# Patient Record
Sex: Female | Born: 1935 | Race: White | Hispanic: No | Smoking: Never smoker
Health system: Southern US, Community
[De-identification: ages and names within clinical notes are randomized; demographics above are authoritative.]

## PROBLEM LIST (undated history)

## (undated) DIAGNOSIS — R29898 Other symptoms and signs involving the musculoskeletal system: Secondary | ICD-10-CM

## (undated) DIAGNOSIS — I959 Hypotension, unspecified: Secondary | ICD-10-CM

## (undated) DIAGNOSIS — R7301 Impaired fasting glucose: Secondary | ICD-10-CM

## (undated) DIAGNOSIS — M5412 Radiculopathy, cervical region: Secondary | ICD-10-CM

## (undated) DIAGNOSIS — B372 Candidiasis of skin and nail: Secondary | ICD-10-CM

## (undated) DIAGNOSIS — I4510 Unspecified right bundle-branch block: Secondary | ICD-10-CM

## (undated) DIAGNOSIS — D692 Other nonthrombocytopenic purpura: Secondary | ICD-10-CM

## (undated) DIAGNOSIS — R5383 Other fatigue: Secondary | ICD-10-CM

## (undated) DIAGNOSIS — C7970 Secondary malignant neoplasm of unspecified adrenal gland: Secondary | ICD-10-CM

## (undated) DIAGNOSIS — C801 Malignant (primary) neoplasm, unspecified: Secondary | ICD-10-CM

## (undated) DIAGNOSIS — K5900 Constipation, unspecified: Secondary | ICD-10-CM

## (undated) DIAGNOSIS — N390 Urinary tract infection, site not specified: Secondary | ICD-10-CM

## (undated) DIAGNOSIS — M1611 Unilateral primary osteoarthritis, right hip: Secondary | ICD-10-CM

## (undated) DIAGNOSIS — S22000A Wedge compression fracture of unspecified thoracic vertebra, initial encounter for closed fracture: Secondary | ICD-10-CM

## (undated) DIAGNOSIS — R350 Frequency of micturition: Secondary | ICD-10-CM

## (undated) DIAGNOSIS — R0602 Shortness of breath: Secondary | ICD-10-CM

## (undated) DIAGNOSIS — R971 Elevated cancer antigen 125 [CA 125]: Secondary | ICD-10-CM

## (undated) DIAGNOSIS — M545 Low back pain: Secondary | ICD-10-CM

## (undated) DIAGNOSIS — R42 Dizziness and giddiness: Secondary | ICD-10-CM

## (undated) DIAGNOSIS — M810 Age-related osteoporosis without current pathological fracture: Secondary | ICD-10-CM

## (undated) DIAGNOSIS — R609 Edema, unspecified: Secondary | ICD-10-CM

## (undated) DIAGNOSIS — H35379 Puckering of macula, unspecified eye: Secondary | ICD-10-CM

## (undated) DIAGNOSIS — E871 Hypo-osmolality and hyponatremia: Secondary | ICD-10-CM

## (undated) DIAGNOSIS — M25559 Pain in unspecified hip: Secondary | ICD-10-CM

## (undated) DIAGNOSIS — C50919 Malignant neoplasm of unspecified site of unspecified female breast: Secondary | ICD-10-CM

## (undated) DIAGNOSIS — K5730 Diverticulosis of large intestine without perforation or abscess without bleeding: Secondary | ICD-10-CM

## (undated) DIAGNOSIS — K589 Irritable bowel syndrome without diarrhea: Secondary | ICD-10-CM

## (undated) DIAGNOSIS — M4120 Other idiopathic scoliosis, site unspecified: Secondary | ICD-10-CM

## (undated) DIAGNOSIS — R918 Other nonspecific abnormal finding of lung field: Secondary | ICD-10-CM

## (undated) DIAGNOSIS — M6530 Trigger finger, unspecified finger: Secondary | ICD-10-CM

## (undated) DIAGNOSIS — M1990 Unspecified osteoarthritis, unspecified site: Secondary | ICD-10-CM

## (undated) DIAGNOSIS — N159 Renal tubulo-interstitial disease, unspecified: Secondary | ICD-10-CM

## (undated) DIAGNOSIS — M48062 Spinal stenosis, lumbar region with neurogenic claudication: Secondary | ICD-10-CM

## (undated) DIAGNOSIS — H3530 Unspecified macular degeneration: Secondary | ICD-10-CM

## (undated) DIAGNOSIS — S2239XA Fracture of one rib, unspecified side, initial encounter for closed fracture: Secondary | ICD-10-CM

## (undated) DIAGNOSIS — H353190 Nonexudative age-related macular degeneration, unspecified eye, stage unspecified: Secondary | ICD-10-CM

## (undated) DIAGNOSIS — F329 Major depressive disorder, single episode, unspecified: Secondary | ICD-10-CM

## (undated) DIAGNOSIS — N760 Acute vaginitis: Secondary | ICD-10-CM

## (undated) DIAGNOSIS — G4700 Insomnia, unspecified: Secondary | ICD-10-CM

## (undated) DIAGNOSIS — H31019 Macula scars of posterior pole (postinflammatory) (post-traumatic), unspecified eye: Secondary | ICD-10-CM

## (undated) DIAGNOSIS — R5381 Other malaise: Secondary | ICD-10-CM

## (undated) DIAGNOSIS — E876 Hypokalemia: Secondary | ICD-10-CM

## (undated) DIAGNOSIS — R948 Abnormal results of function studies of other organs and systems: Secondary | ICD-10-CM

## (undated) DIAGNOSIS — L679 Hair color and hair shaft abnormality, unspecified: Secondary | ICD-10-CM

## (undated) DIAGNOSIS — H9190 Unspecified hearing loss, unspecified ear: Secondary | ICD-10-CM

## (undated) DIAGNOSIS — S2249XA Multiple fractures of ribs, unspecified side, initial encounter for closed fracture: Secondary | ICD-10-CM

## (undated) DIAGNOSIS — I10 Essential (primary) hypertension: Secondary | ICD-10-CM

## (undated) DIAGNOSIS — M62838 Other muscle spasm: Secondary | ICD-10-CM

## (undated) DIAGNOSIS — L259 Unspecified contact dermatitis, unspecified cause: Secondary | ICD-10-CM

## (undated) DIAGNOSIS — S72043A Displaced fracture of base of neck of unspecified femur, initial encounter for closed fracture: Secondary | ICD-10-CM

## (undated) DIAGNOSIS — H8110 Benign paroxysmal vertigo, unspecified ear: Secondary | ICD-10-CM

## (undated) HISTORY — DX: Puckering of macula, unspecified eye: H35.379

## (undated) HISTORY — DX: Radiculopathy, cervical region: M54.12

## (undated) HISTORY — DX: Hair color and hair shaft abnormality, unspecified: L67.9

## (undated) HISTORY — DX: Other fatigue: R53.83

## (undated) HISTORY — DX: Other malaise: R53.81

## (undated) HISTORY — DX: Irritable bowel syndrome without diarrhea: K58.9

## (undated) HISTORY — DX: Other muscle spasm: M62.838

## (undated) HISTORY — DX: Hypokalemia: E87.6

## (undated) HISTORY — DX: Major depressive disorder, single episode, unspecified: F32.9

## (undated) HISTORY — DX: Secondary malignant neoplasm of unspecified adrenal gland: C79.70

## (undated) HISTORY — DX: Low back pain: M54.5

## (undated) HISTORY — DX: Hypotension, unspecified: I95.9

## (undated) HISTORY — DX: Elevated cancer antigen 125 (CA 125): R97.1

## (undated) HISTORY — DX: Diverticulosis of large intestine without perforation or abscess without bleeding: K57.30

## (undated) HISTORY — DX: Unspecified right bundle-branch block: I45.10

## (undated) HISTORY — DX: Wedge compression fracture of unspecified thoracic vertebra, initial encounter for closed fracture: S22.000A

## (undated) HISTORY — DX: Unilateral primary osteoarthritis, right hip: M16.11

## (undated) HISTORY — PX: BREAST SURGERY: SHX581

## (undated) HISTORY — DX: Edema, unspecified: R60.9

## (undated) HISTORY — DX: Other nonspecific abnormal finding of lung field: R91.8

## (undated) HISTORY — DX: Candidiasis of skin and nail: B37.2

## (undated) HISTORY — DX: Spinal stenosis, lumbar region with neurogenic claudication: M48.062

## (undated) HISTORY — DX: Malignant neoplasm of unspecified site of unspecified female breast: C50.919

## (undated) HISTORY — DX: Displaced fracture of base of neck of unspecified femur, initial encounter for closed fracture: S72.043A

## (undated) HISTORY — DX: Age-related osteoporosis without current pathological fracture: M81.0

## (undated) HISTORY — DX: Trigger finger, unspecified finger: M65.30

## (undated) HISTORY — DX: Macula scars of posterior pole (postinflammatory) (post-traumatic), unspecified eye: H31.019

## (undated) HISTORY — DX: Nonexudative age-related macular degeneration, unspecified eye, stage unspecified: H35.3190

## (undated) HISTORY — DX: Dizziness and giddiness: R42

## (undated) HISTORY — DX: Hypo-osmolality and hyponatremia: E87.1

## (undated) HISTORY — DX: Constipation, unspecified: K59.00

## (undated) HISTORY — DX: Urinary tract infection, site not specified: N39.0

## (undated) HISTORY — DX: Unspecified contact dermatitis, unspecified cause: L25.9

## (undated) HISTORY — DX: Other idiopathic scoliosis, site unspecified: M41.20

## (undated) HISTORY — DX: Essential (primary) hypertension: I10

## (undated) HISTORY — DX: Impaired fasting glucose: R73.01

## (undated) HISTORY — DX: Insomnia, unspecified: G47.00

## (undated) HISTORY — DX: Benign paroxysmal vertigo, unspecified ear: H81.10

## (undated) HISTORY — PX: TONSILLECTOMY: SUR1361

## (undated) HISTORY — DX: Other symptoms and signs involving the musculoskeletal system: R29.898

## (undated) HISTORY — PX: EYE SURGERY: SHX253

## (undated) HISTORY — DX: Abnormal results of function studies of other organs and systems: R94.8

## (undated) HISTORY — DX: Other nonthrombocytopenic purpura: D69.2

## (undated) HISTORY — DX: Acute vaginitis: N76.0

## (undated) HISTORY — DX: Pain in unspecified hip: M25.559

## (undated) HISTORY — DX: Frequency of micturition: R35.0

---

## 1980-09-11 HISTORY — PX: FRACTURE SURGERY: SHX138

## 1998-03-17 ENCOUNTER — Ambulatory Visit (HOSPITAL_COMMUNITY): Admission: RE | Admit: 1998-03-17 | Discharge: 1998-03-17 | Payer: Self-pay | Admitting: Obstetrics & Gynecology

## 2001-05-04 ENCOUNTER — Encounter: Admission: RE | Admit: 2001-05-04 | Discharge: 2001-05-04 | Payer: Self-pay | Admitting: Neurology

## 2001-05-04 ENCOUNTER — Encounter: Payer: Self-pay | Admitting: Neurology

## 2001-05-13 ENCOUNTER — Ambulatory Visit (HOSPITAL_COMMUNITY): Admission: RE | Admit: 2001-05-13 | Discharge: 2001-05-13 | Payer: Self-pay | Admitting: Neurology

## 2001-05-13 ENCOUNTER — Encounter: Payer: Self-pay | Admitting: Neurology

## 2002-08-22 ENCOUNTER — Other Ambulatory Visit: Admission: RE | Admit: 2002-08-22 | Discharge: 2002-08-22 | Payer: Self-pay | Admitting: Obstetrics and Gynecology

## 2002-10-28 DIAGNOSIS — C50919 Malignant neoplasm of unspecified site of unspecified female breast: Secondary | ICD-10-CM

## 2002-10-28 HISTORY — DX: Malignant neoplasm of unspecified site of unspecified female breast: C50.919

## 2003-01-07 ENCOUNTER — Encounter: Admission: RE | Admit: 2003-01-07 | Discharge: 2003-01-07 | Payer: Self-pay | Admitting: Neurology

## 2003-01-21 ENCOUNTER — Encounter: Admission: RE | Admit: 2003-01-21 | Discharge: 2003-01-21 | Payer: Self-pay | Admitting: Neurology

## 2003-02-04 ENCOUNTER — Encounter: Admission: RE | Admit: 2003-02-04 | Discharge: 2003-02-04 | Payer: Self-pay | Admitting: Neurology

## 2004-09-07 ENCOUNTER — Encounter: Admission: RE | Admit: 2004-09-07 | Discharge: 2004-10-01 | Payer: Self-pay | Admitting: Neurology

## 2004-10-22 ENCOUNTER — Encounter: Admission: RE | Admit: 2004-10-22 | Discharge: 2004-10-22 | Payer: Self-pay | Admitting: Radiology

## 2004-10-23 ENCOUNTER — Encounter: Admission: RE | Admit: 2004-10-23 | Discharge: 2004-10-23 | Payer: Self-pay | Admitting: Surgery

## 2004-10-27 ENCOUNTER — Encounter: Admission: RE | Admit: 2004-10-27 | Discharge: 2004-10-27 | Payer: Self-pay | Admitting: Surgery

## 2004-10-27 ENCOUNTER — Ambulatory Visit (HOSPITAL_COMMUNITY): Admission: RE | Admit: 2004-10-27 | Discharge: 2004-10-27 | Payer: Self-pay | Admitting: Surgery

## 2004-10-27 ENCOUNTER — Encounter (INDEPENDENT_AMBULATORY_CARE_PROVIDER_SITE_OTHER): Payer: Self-pay | Admitting: *Deleted

## 2004-10-27 ENCOUNTER — Ambulatory Visit (HOSPITAL_BASED_OUTPATIENT_CLINIC_OR_DEPARTMENT_OTHER): Admission: RE | Admit: 2004-10-27 | Discharge: 2004-10-28 | Payer: Self-pay | Admitting: Surgery

## 2004-10-27 DIAGNOSIS — C50919 Malignant neoplasm of unspecified site of unspecified female breast: Secondary | ICD-10-CM | POA: Insufficient documentation

## 2004-10-27 HISTORY — DX: Malignant neoplasm of unspecified site of unspecified female breast: C50.919

## 2004-11-04 ENCOUNTER — Ambulatory Visit: Payer: Self-pay | Admitting: Oncology

## 2004-11-19 ENCOUNTER — Encounter: Admission: RE | Admit: 2004-11-19 | Discharge: 2004-11-19 | Payer: Self-pay | Admitting: Oncology

## 2004-11-19 ENCOUNTER — Ambulatory Visit: Admission: RE | Admit: 2004-11-19 | Discharge: 2005-02-05 | Payer: Self-pay | Admitting: *Deleted

## 2004-11-24 ENCOUNTER — Encounter: Admission: RE | Admit: 2004-11-24 | Discharge: 2004-11-24 | Payer: Self-pay | Admitting: Oncology

## 2004-11-30 ENCOUNTER — Other Ambulatory Visit: Admission: RE | Admit: 2004-11-30 | Discharge: 2004-11-30 | Payer: Self-pay | Admitting: Obstetrics and Gynecology

## 2005-01-27 ENCOUNTER — Ambulatory Visit: Payer: Self-pay | Admitting: Oncology

## 2005-03-23 ENCOUNTER — Ambulatory Visit: Payer: Self-pay | Admitting: Oncology

## 2005-07-22 ENCOUNTER — Ambulatory Visit: Payer: Self-pay | Admitting: Oncology

## 2005-07-28 LAB — CBC WITH DIFFERENTIAL/PLATELET
HCT: 32.9 % — ABNORMAL LOW (ref 34.8–46.6)
HGB: 11.1 g/dL — ABNORMAL LOW (ref 11.6–15.9)
LYMPH%: 17 % (ref 14.0–48.0)
MCH: 27.8 pg (ref 26.0–34.0)
MCHC: 33.7 g/dL (ref 32.0–36.0)
MCV: 82.8 fL (ref 81.0–101.0)
RDW: 14.3 % (ref 11.3–14.5)

## 2005-07-28 LAB — COMPREHENSIVE METABOLIC PANEL
Albumin: 3.5 g/dL (ref 3.5–5.2)
CO2: 25 mEq/L (ref 19–32)
Calcium: 8.6 mg/dL (ref 8.4–10.5)
Chloride: 99 mEq/L (ref 96–112)
Glucose, Bld: 149 mg/dL — ABNORMAL HIGH (ref 70–99)
Potassium: 4.2 mEq/L (ref 3.5–5.3)
Sodium: 135 mEq/L (ref 135–145)
Total Bilirubin: 0.3 mg/dL (ref 0.3–1.2)
Total Protein: 6.3 g/dL (ref 6.0–8.3)

## 2005-07-28 LAB — CANCER ANTIGEN 27.29: CA 27.29: 19 U/mL (ref 0–39)

## 2005-08-02 ENCOUNTER — Ambulatory Visit (HOSPITAL_COMMUNITY): Admission: RE | Admit: 2005-08-02 | Discharge: 2005-08-02 | Payer: Self-pay | Admitting: Oncology

## 2005-08-30 LAB — CBC & DIFF AND RETIC
Basophils Absolute: 0 10*3/uL (ref 0.0–0.1)
Eosinophils Absolute: 0.2 10*3/uL (ref 0.0–0.5)
HCT: 33.6 % — ABNORMAL LOW (ref 34.8–46.6)
HGB: 11.3 g/dL — ABNORMAL LOW (ref 11.6–15.9)
LYMPH%: 23.4 % (ref 14.0–48.0)
MCV: 82.4 fL (ref 81.0–101.0)
MONO#: 0.6 10*3/uL (ref 0.1–0.9)
MONO%: 6.4 % (ref 0.0–13.0)
NEUT#: 6.5 10*3/uL (ref 1.5–6.5)
NEUT%: 67.9 % (ref 39.6–76.8)
Platelets: 286 10*3/uL (ref 145–400)
RBC: 4.08 10*6/uL (ref 3.70–5.32)
Retic %: 1.5 % (ref 0.4–2.3)
WBC: 9.5 10*3/uL (ref 3.9–10.0)

## 2005-08-30 LAB — COMPREHENSIVE METABOLIC PANEL
ALT: 13 U/L (ref 0–40)
Albumin: 3.8 g/dL (ref 3.5–5.2)
Alkaline Phosphatase: 49 U/L (ref 39–117)
CO2: 27 mEq/L (ref 19–32)
Glucose, Bld: 123 mg/dL — ABNORMAL HIGH (ref 70–99)
Potassium: 4.3 mEq/L (ref 3.5–5.3)
Sodium: 135 mEq/L (ref 135–145)
Total Bilirubin: 0.3 mg/dL (ref 0.3–1.2)
Total Protein: 6.4 g/dL (ref 6.0–8.3)

## 2005-08-30 LAB — FERRITIN: Ferritin: 74 ng/mL (ref 10–291)

## 2005-08-30 LAB — CANCER ANTIGEN 27.29: CA 27.29: 18 U/mL (ref 0–39)

## 2005-09-06 ENCOUNTER — Ambulatory Visit: Payer: Self-pay | Admitting: Oncology

## 2005-10-29 ENCOUNTER — Ambulatory Visit: Payer: Self-pay | Admitting: Oncology

## 2005-11-02 ENCOUNTER — Ambulatory Visit (HOSPITAL_COMMUNITY): Admission: RE | Admit: 2005-11-02 | Discharge: 2005-11-02 | Payer: Self-pay | Admitting: Oncology

## 2006-01-27 ENCOUNTER — Ambulatory Visit: Payer: Self-pay | Admitting: Oncology

## 2006-02-01 ENCOUNTER — Ambulatory Visit (HOSPITAL_COMMUNITY): Admission: RE | Admit: 2006-02-01 | Discharge: 2006-02-01 | Payer: Self-pay | Admitting: Oncology

## 2006-02-01 LAB — COMPREHENSIVE METABOLIC PANEL
ALT: 9 U/L (ref 0–35)
AST: 13 U/L (ref 0–37)
CO2: 28 mEq/L (ref 19–32)
Calcium: 9.1 mg/dL (ref 8.4–10.5)
Chloride: 101 mEq/L (ref 96–112)
Sodium: 138 mEq/L (ref 135–145)
Total Protein: 6.7 g/dL (ref 6.0–8.3)

## 2006-02-01 LAB — CBC WITH DIFFERENTIAL/PLATELET
BASO%: 0.4 % (ref 0.0–2.0)
EOS%: 3.2 % (ref 0.0–7.0)
MCH: 30.3 pg (ref 26.0–34.0)
MCHC: 34.3 g/dL (ref 32.0–36.0)
MONO#: 0.5 10*3/uL (ref 0.1–0.9)
RBC: 4.22 10*6/uL (ref 3.70–5.32)
RDW: 14.7 % — ABNORMAL HIGH (ref 11.3–14.5)
WBC: 6.3 10*3/uL (ref 3.9–10.0)
lymph#: 1.8 10*3/uL (ref 0.9–3.3)

## 2006-02-01 LAB — CANCER ANTIGEN 27.29: CA 27.29: 15 U/mL (ref 0–39)

## 2006-02-09 ENCOUNTER — Ambulatory Visit (HOSPITAL_COMMUNITY): Admission: RE | Admit: 2006-02-09 | Discharge: 2006-02-09 | Payer: Self-pay | Admitting: Oncology

## 2006-04-28 ENCOUNTER — Ambulatory Visit: Payer: Self-pay | Admitting: Oncology

## 2006-05-03 ENCOUNTER — Ambulatory Visit (HOSPITAL_COMMUNITY): Admission: RE | Admit: 2006-05-03 | Discharge: 2006-05-03 | Payer: Self-pay | Admitting: Oncology

## 2006-05-03 LAB — CBC WITH DIFFERENTIAL/PLATELET
BASO%: 0.4 % (ref 0.0–2.0)
EOS%: 4 % (ref 0.0–7.0)
HCT: 34.2 % — ABNORMAL LOW (ref 34.8–46.6)
LYMPH%: 28.4 % (ref 14.0–48.0)
MCH: 30.4 pg (ref 26.0–34.0)
MCHC: 34.6 g/dL (ref 32.0–36.0)
NEUT%: 56 % (ref 39.6–76.8)
Platelets: 186 10*3/uL (ref 145–400)
RBC: 3.9 10*6/uL (ref 3.70–5.32)
lymph#: 1.5 10*3/uL (ref 0.9–3.3)

## 2006-05-03 LAB — COMPREHENSIVE METABOLIC PANEL
ALT: 9 U/L (ref 0–35)
AST: 15 U/L (ref 0–37)
Alkaline Phosphatase: 38 U/L — ABNORMAL LOW (ref 39–117)
Creatinine, Ser: 0.73 mg/dL (ref 0.40–1.20)
Total Bilirubin: 0.3 mg/dL (ref 0.3–1.2)

## 2006-11-01 ENCOUNTER — Ambulatory Visit: Payer: Self-pay | Admitting: Oncology

## 2006-11-03 LAB — COMPREHENSIVE METABOLIC PANEL
AST: 16 U/L (ref 0–37)
BUN: 13 mg/dL (ref 6–23)
Calcium: 9.2 mg/dL (ref 8.4–10.5)
Chloride: 101 mEq/L (ref 96–112)
Creatinine, Ser: 0.87 mg/dL (ref 0.40–1.20)

## 2006-11-03 LAB — CBC WITH DIFFERENTIAL/PLATELET
Basophils Absolute: 0 10*3/uL (ref 0.0–0.1)
EOS%: 2.4 % (ref 0.0–7.0)
HCT: 35.7 % (ref 34.8–46.6)
HGB: 12.3 g/dL (ref 11.6–15.9)
MCH: 30.5 pg (ref 26.0–34.0)
MCV: 89 fL (ref 81.0–101.0)
MONO%: 7.8 % (ref 0.0–13.0)
NEUT%: 55.7 % (ref 39.6–76.8)
Platelets: 230 10*3/uL (ref 145–400)
lymph#: 2.4 10*3/uL (ref 0.9–3.3)

## 2006-11-12 LAB — CLOSTRIDIUM DIFFICILE EIA

## 2006-11-15 LAB — VITAMIN D PNL(25-HYDRXY+1,25-DIHY)-BLD: Vit D, 25-Hydroxy: 35 ng/mL (ref 30–89)

## 2007-05-04 ENCOUNTER — Ambulatory Visit: Payer: Self-pay | Admitting: Oncology

## 2007-05-09 LAB — CBC WITH DIFFERENTIAL/PLATELET
BASO%: 0.4 % (ref 0.0–2.0)
Eosinophils Absolute: 0.2 10*3/uL (ref 0.0–0.5)
MONO#: 0.6 10*3/uL (ref 0.1–0.9)
NEUT#: 3.5 10*3/uL (ref 1.5–6.5)
RBC: 4.06 10*6/uL (ref 3.70–5.32)
RDW: 14.2 % (ref 11.3–14.5)
WBC: 6.3 10*3/uL (ref 3.9–10.0)

## 2007-05-09 LAB — COMPREHENSIVE METABOLIC PANEL
AST: 22 U/L (ref 0–37)
Albumin: 3.6 g/dL (ref 3.5–5.2)
Alkaline Phosphatase: 30 U/L — ABNORMAL LOW (ref 39–117)
BUN: 8 mg/dL (ref 6–23)
Creatinine, Ser: 0.76 mg/dL (ref 0.40–1.20)
Glucose, Bld: 122 mg/dL — ABNORMAL HIGH (ref 70–99)
Potassium: 3.8 mEq/L (ref 3.5–5.3)
Total Bilirubin: 0.4 mg/dL (ref 0.3–1.2)

## 2007-11-03 ENCOUNTER — Ambulatory Visit: Payer: Self-pay | Admitting: Oncology

## 2007-11-07 LAB — CBC WITH DIFFERENTIAL/PLATELET
Basophils Absolute: 0 10*3/uL (ref 0.0–0.1)
EOS%: 1.9 % (ref 0.0–7.0)
HCT: 36.5 % (ref 34.8–46.6)
HGB: 12.4 g/dL (ref 11.6–15.9)
MCH: 30.7 pg (ref 26.0–34.0)
MONO#: 0.5 10*3/uL (ref 0.1–0.9)
NEUT%: 59.8 % (ref 39.6–76.8)
lymph#: 1.9 10*3/uL (ref 0.9–3.3)

## 2007-11-08 LAB — COMPREHENSIVE METABOLIC PANEL
BUN: 14 mg/dL (ref 6–23)
CO2: 24 mEq/L (ref 19–32)
Calcium: 9.2 mg/dL (ref 8.4–10.5)
Chloride: 103 mEq/L (ref 96–112)
Creatinine, Ser: 0.77 mg/dL (ref 0.40–1.20)
Glucose, Bld: 126 mg/dL — ABNORMAL HIGH (ref 70–99)

## 2007-11-08 LAB — CANCER ANTIGEN 27.29: CA 27.29: 19 U/mL (ref 0–39)

## 2007-11-08 LAB — VITAMIN D 25 HYDROXY (VIT D DEFICIENCY, FRACTURES): Vit D, 25-Hydroxy: 40 ng/mL (ref 30–89)

## 2008-01-12 HISTORY — PX: CATARACT EXTRACTION: SUR2

## 2008-05-03 ENCOUNTER — Ambulatory Visit: Payer: Self-pay | Admitting: Oncology

## 2008-05-10 LAB — CBC WITH DIFFERENTIAL/PLATELET
BASO%: 0.3 % (ref 0.0–2.0)
Basophils Absolute: 0 10*3/uL (ref 0.0–0.1)
EOS%: 3 % (ref 0.0–7.0)
Eosinophils Absolute: 0.2 10*3/uL (ref 0.0–0.5)
HCT: 36.3 % (ref 34.8–46.6)
HGB: 12.4 g/dL (ref 11.6–15.9)
LYMPH%: 35.8 % (ref 14.0–49.7)
MCH: 30.6 pg (ref 25.1–34.0)
MCHC: 34.1 g/dL (ref 31.5–36.0)
MCV: 89.7 fL (ref 79.5–101.0)
MONO#: 0.5 10*3/uL (ref 0.1–0.9)
MONO%: 8 % (ref 0.0–14.0)
NEUT#: 3.6 10*3/uL (ref 1.5–6.5)
NEUT%: 52.9 % (ref 38.4–76.8)
Platelets: 186 10*3/uL (ref 145–400)
RBC: 4.04 10*6/uL (ref 3.70–5.45)
RDW: 14.2 % (ref 11.2–14.5)
WBC: 6.8 10*3/uL (ref 3.9–10.3)
lymph#: 2.4 10*3/uL (ref 0.9–3.3)

## 2008-05-10 LAB — COMPREHENSIVE METABOLIC PANEL
ALT: 12 U/L (ref 0–35)
AST: 17 U/L (ref 0–37)
Albumin: 4.2 g/dL (ref 3.5–5.2)
Alkaline Phosphatase: 36 U/L — ABNORMAL LOW (ref 39–117)
BUN: 12 mg/dL (ref 6–23)
CO2: 26 mEq/L (ref 19–32)
Calcium: 9.5 mg/dL (ref 8.4–10.5)
Chloride: 101 mEq/L (ref 96–112)
Creatinine, Ser: 0.79 mg/dL (ref 0.40–1.20)
Glucose, Bld: 105 mg/dL — ABNORMAL HIGH (ref 70–99)
Potassium: 4.3 mEq/L (ref 3.5–5.3)
Sodium: 137 mEq/L (ref 135–145)
Total Bilirubin: 0.3 mg/dL (ref 0.3–1.2)
Total Protein: 6.6 g/dL (ref 6.0–8.3)

## 2008-05-10 LAB — RESEARCH LABS

## 2008-11-01 ENCOUNTER — Ambulatory Visit: Payer: Self-pay | Admitting: Oncology

## 2008-11-06 LAB — CBC WITH DIFFERENTIAL/PLATELET
BASO%: 0.4 % (ref 0.0–2.0)
Basophils Absolute: 0 10*3/uL (ref 0.0–0.1)
EOS%: 2.1 % (ref 0.0–7.0)
Eosinophils Absolute: 0.2 10*3/uL (ref 0.0–0.5)
HCT: 37.9 % (ref 34.8–46.6)
HGB: 12.6 g/dL (ref 11.6–15.9)
LYMPH%: 31.8 % (ref 14.0–49.7)
MCH: 30.3 pg (ref 25.1–34.0)
MCHC: 33.2 g/dL (ref 31.5–36.0)
MCV: 91.3 fL (ref 79.5–101.0)
MONO#: 0.6 10*3/uL (ref 0.1–0.9)
MONO%: 7.7 % (ref 0.0–14.0)
NEUT#: 4.2 10*3/uL (ref 1.5–6.5)
NEUT%: 58 % (ref 38.4–76.8)
Platelets: 182 10*3/uL (ref 145–400)
RBC: 4.15 10*6/uL (ref 3.70–5.45)
RDW: 14.1 % (ref 11.2–14.5)
WBC: 7.2 10*3/uL (ref 3.9–10.3)
lymph#: 2.3 10*3/uL (ref 0.9–3.3)

## 2008-11-06 LAB — COMPREHENSIVE METABOLIC PANEL
ALT: 11 U/L (ref 0–35)
AST: 15 U/L (ref 0–37)
Albumin: 4.2 g/dL (ref 3.5–5.2)
Alkaline Phosphatase: 38 U/L — ABNORMAL LOW (ref 39–117)
BUN: 12 mg/dL (ref 6–23)
CO2: 22 mEq/L (ref 19–32)
Calcium: 9.1 mg/dL (ref 8.4–10.5)
Chloride: 100 mEq/L (ref 96–112)
Creatinine, Ser: 0.85 mg/dL (ref 0.40–1.20)
Glucose, Bld: 128 mg/dL — ABNORMAL HIGH (ref 70–99)
Potassium: 4.1 mEq/L (ref 3.5–5.3)
Sodium: 136 mEq/L (ref 135–145)
Total Bilirubin: 0.3 mg/dL (ref 0.3–1.2)
Total Protein: 6.8 g/dL (ref 6.0–8.3)

## 2009-05-06 ENCOUNTER — Ambulatory Visit: Payer: Self-pay | Admitting: Oncology

## 2009-05-07 LAB — CBC WITH DIFFERENTIAL/PLATELET
BASO%: 0.3 % (ref 0.0–2.0)
Basophils Absolute: 0 10*3/uL (ref 0.0–0.1)
EOS%: 2.1 % (ref 0.0–7.0)
Eosinophils Absolute: 0.1 10*3/uL (ref 0.0–0.5)
HCT: 38.7 % (ref 34.8–46.6)
HGB: 13.2 g/dL (ref 11.6–15.9)
LYMPH%: 34.2 % (ref 14.0–49.7)
MCH: 31 pg (ref 25.1–34.0)
MCHC: 34 g/dL (ref 31.5–36.0)
MCV: 91.3 fL (ref 79.5–101.0)
MONO#: 0.6 10*3/uL (ref 0.1–0.9)
MONO%: 8.7 % (ref 0.0–14.0)
NEUT#: 3.8 10*3/uL (ref 1.5–6.5)
NEUT%: 54.7 % (ref 38.4–76.8)
Platelets: 191 10*3/uL (ref 145–400)
RBC: 4.24 10*6/uL (ref 3.70–5.45)
RDW: 13.9 % (ref 11.2–14.5)
WBC: 7 10*3/uL (ref 3.9–10.3)
lymph#: 2.4 10*3/uL (ref 0.9–3.3)

## 2009-05-07 LAB — COMPREHENSIVE METABOLIC PANEL
ALT: 15 U/L (ref 0–35)
AST: 24 U/L (ref 0–37)
Albumin: 4 g/dL (ref 3.5–5.2)
Alkaline Phosphatase: 41 U/L (ref 39–117)
BUN: 10 mg/dL (ref 6–23)
CO2: 27 mEq/L (ref 19–32)
Calcium: 9 mg/dL (ref 8.4–10.5)
Chloride: 100 mEq/L (ref 96–112)
Creatinine, Ser: 0.86 mg/dL (ref 0.40–1.20)
Glucose, Bld: 151 mg/dL — ABNORMAL HIGH (ref 70–99)
Potassium: 3.5 mEq/L (ref 3.5–5.3)
Sodium: 134 mEq/L — ABNORMAL LOW (ref 135–145)
Total Bilirubin: 0.4 mg/dL (ref 0.3–1.2)
Total Protein: 6.7 g/dL (ref 6.0–8.3)

## 2009-05-07 LAB — VITAMIN D 25 HYDROXY (VIT D DEFICIENCY, FRACTURES): Vit D, 25-Hydroxy: 46 ng/mL (ref 30–89)

## 2009-05-29 ENCOUNTER — Encounter: Admission: RE | Admit: 2009-05-29 | Discharge: 2009-05-29 | Payer: Self-pay | Admitting: Endocrinology

## 2010-01-07 ENCOUNTER — Ambulatory Visit: Payer: Self-pay | Admitting: Oncology

## 2010-01-13 LAB — CBC WITH DIFFERENTIAL/PLATELET
BASO%: 0.5 % (ref 0.0–2.0)
Basophils Absolute: 0 10*3/uL (ref 0.0–0.1)
EOS%: 3.4 % (ref 0.0–7.0)
Eosinophils Absolute: 0.3 10*3/uL (ref 0.0–0.5)
HCT: 36.5 % (ref 34.8–46.6)
HGB: 12.6 g/dL (ref 11.6–15.9)
LYMPH%: 33.4 % (ref 14.0–49.7)
MCH: 31 pg (ref 25.1–34.0)
MCHC: 34.6 g/dL (ref 31.5–36.0)
MCV: 89.6 fL (ref 79.5–101.0)
MONO#: 0.6 10*3/uL (ref 0.1–0.9)
MONO%: 8 % (ref 0.0–14.0)
NEUT#: 4.1 10*3/uL (ref 1.5–6.5)
NEUT%: 54.7 % (ref 38.4–76.8)
Platelets: 181 10*3/uL (ref 145–400)
RBC: 4.07 10*6/uL (ref 3.70–5.45)
RDW: 13.9 % (ref 11.2–14.5)
WBC: 7.6 10*3/uL (ref 3.9–10.3)
lymph#: 2.5 10*3/uL (ref 0.9–3.3)

## 2010-01-13 LAB — COMPREHENSIVE METABOLIC PANEL
ALT: 12 U/L (ref 0–35)
AST: 22 U/L (ref 0–37)
Albumin: 3.8 g/dL (ref 3.5–5.2)
Alkaline Phosphatase: 40 U/L (ref 39–117)
BUN: 8 mg/dL (ref 6–23)
CO2: 28 mEq/L (ref 19–32)
Calcium: 9.2 mg/dL (ref 8.4–10.5)
Chloride: 100 mEq/L (ref 96–112)
Creatinine, Ser: 0.81 mg/dL (ref 0.40–1.20)
Glucose, Bld: 153 mg/dL — ABNORMAL HIGH (ref 70–99)
Potassium: 3.9 mEq/L (ref 3.5–5.3)
Sodium: 136 mEq/L (ref 135–145)
Total Bilirubin: 0.3 mg/dL (ref 0.3–1.2)
Total Protein: 6.6 g/dL (ref 6.0–8.3)

## 2010-05-29 NOTE — Op Note (Signed)
Cassandra Glenn, Cassandra Glenn               ACCOUNT NO.:  [id number]   MEDICAL RECORD NO.:  [id number]          PATIENT TYPE:  AMB   LOCATION:  DSC                          FACILITY:  [facility]   PHYSICIAN:  Currie Paris, M.D.DATE OF BIRTH:  Aug 29, 1920   DATE OF PROCEDURE:  10/27/2004  DATE OF DISCHARGE:                                 OPERATIVE REPORT   CCS [phone].   PREOP DIAGNOSIS:  Invasive right breast cancer, noninvasive left breast  cancer.   POSTOPERATIVE DIAGNOSIS:  Invasive right breast cancer, noninvasive left  breast cancer with right axillary metastases.   OPERATION:  Needle guided right lumpectomy with blue dye injection, axillary  sentinel lymph node biopsy and full axillary dissection; needle guided left  lumpectomy.   SURGEON:  Dr. Jamey Ripa.   ANESTHESIA:  General.   CLINICAL HISTORY:  This is an 83-year lady who has presented with an  invasive breast cancer on the right and what appeared to be a noninvasive on  the left. She had gotten significant bruising at the time of her biopsy so  it was not clear how much of the palpable abnormality was tumor and how much  was a hematoma.  She therefore and bilateral guide guidewires placed to be  sure that the palpable abnormalities coincide with the image found  abnormalities.   DESCRIPTION OF PROCEDURE:  The patient was seen in the holding area and she  had no further questions. We confirmed that we were going to do a needle  guided right lumpectomy with axillary dissection, actually sentinel lymph  node biopsy on the right and a left needle guided lumpectomy. Guidewire has  already been placed.   The patient taken to operating room and after satisfactory general  anesthesia had been obtained, the right breast areolar area was prepped with  alcohol and the time-out occurred. I then injected 5 mL of dilute methylene  blue dye. This was massaged in. Both breasts were prepped and draped as a  single sterile field.   The  mass in the right breast was in the upper outer quadrant with some  apparent fixation to the skin and almost into the axilla. Guidewire entered  laterally and tracked medially. I made an elliptical incision around the  entire palpable abnormality taking about a centimeter wide area of skin.  Using the cautery, I did full-thickness excision down to fascia well around  the guidewire manipulating guidewire laterally into this specimen. This was  then sent for specimen mammography.   While waiting for this I entered the axilla and using a Neoprobe found a one  firm, hard node that had some counts about 60-100. This was excised as one  sentinel node. A second hot node again with counts about 40 was found and  excised and this likewise was sent for Touch Preps. Both had no blue dye. In  fact I could find no blue dye within the axilla.   While waiting for reports I went ahead and injected some Marcaine and made  sure everything was dry and closed this incision with 3-0 Vicryl and 4-0  Monocryl  subcuticular.   Attention was turned to the left breast and again guidewire then placed.  There is a palpable abnormality at about the 12 o'clock position. Using new  instruments, gloves, etc. I made an elliptical incision over the mass and I  excised it again down to chest wall and was felt I was well around the area  of abnormality. Specimen myography subsequently confirmed that to be the  case.   This incision was likewise checked for hemostasis and anesthetized with some  0.25% plain Marcaine and closed with 3-0 Vicryl and 4-0 Monocryl  subcuticular.   Pathology reported that one of the sentinel nodes was definitely positive  and the second questionable. I therefore turned my attention back to the  right side and reopened that incision and completed an axillary dissection  as planned, should the sentinel node be positive. I opened the clavipectoral  fascia until I could identify the axillary vein,   stripped the axillary  contents from medial to lateral superior to inferior. The long thoracic  thoracodorsal nerves were identified and preserved as was the blood supply  to the pectoralis. A second intercostal was taken at the edge of the chest  wall. Once everything had been disconnected off the latissimus, this  specimens was sent off. I irrigated, spent several minutes making sure  everything was dry. Once everything looked okay, I went ahead and put a 19  Blake drain in through a separate stab wound and secured it with 2-0 nylon.  I injected a little bit more Marcaine to see if we could help with postop  analgesia and closed incision in layers with 3-0 Vicryl and 4-0 Monocryl  subcuticular plus Dermabond.   The patient tolerated the procedure well. No operative complications. All  counts were correct.      Currie Paris, M.D.  Electronically Signed     CJS/MEDQ  D:  10/27/2004  T:  10/27/2004  Job:  [id number]   cc:   Veverly Fells. Altheimer, M.D.  Fax: [phone]

## 2010-11-02 ENCOUNTER — Emergency Department (HOSPITAL_COMMUNITY): Payer: Medicare Other

## 2010-11-02 ENCOUNTER — Emergency Department (HOSPITAL_COMMUNITY)
Admission: EM | Admit: 2010-11-02 | Discharge: 2010-11-02 | Disposition: A | Discharge status: Home / Self Care | Payer: Medicare Other | Attending: Emergency Medicine | Admitting: Emergency Medicine

## 2010-11-02 DIAGNOSIS — R55 Syncope and collapse: Secondary | ICD-10-CM | POA: Insufficient documentation

## 2010-11-02 DIAGNOSIS — Y9229 Other specified public building as the place of occurrence of the external cause: Secondary | ICD-10-CM | POA: Insufficient documentation

## 2010-11-02 DIAGNOSIS — W108XXA Fall (on) (from) other stairs and steps, initial encounter: Secondary | ICD-10-CM | POA: Insufficient documentation

## 2010-11-02 DIAGNOSIS — M7989 Other specified soft tissue disorders: Secondary | ICD-10-CM | POA: Insufficient documentation

## 2010-11-02 DIAGNOSIS — M79609 Pain in unspecified limb: Secondary | ICD-10-CM | POA: Insufficient documentation

## 2010-11-02 DIAGNOSIS — S92309A Fracture of unspecified metatarsal bone(s), unspecified foot, initial encounter for closed fracture: Secondary | ICD-10-CM | POA: Insufficient documentation

## 2010-12-10 DIAGNOSIS — H353190 Nonexudative age-related macular degeneration, unspecified eye, stage unspecified: Secondary | ICD-10-CM | POA: Insufficient documentation

## 2010-12-10 DIAGNOSIS — H43819 Vitreous degeneration, unspecified eye: Secondary | ICD-10-CM | POA: Insufficient documentation

## 2010-12-10 DIAGNOSIS — H35379 Puckering of macula, unspecified eye: Secondary | ICD-10-CM | POA: Insufficient documentation

## 2010-12-10 HISTORY — DX: Nonexudative age-related macular degeneration, unspecified eye, stage unspecified: H35.3190

## 2010-12-10 HISTORY — DX: Puckering of macula, unspecified eye: H35.379

## 2011-01-18 DIAGNOSIS — Z1211 Encounter for screening for malignant neoplasm of colon: Secondary | ICD-10-CM | POA: Diagnosis not present

## 2011-01-18 DIAGNOSIS — R197 Diarrhea, unspecified: Secondary | ICD-10-CM | POA: Diagnosis not present

## 2011-02-04 ENCOUNTER — Other Ambulatory Visit: Payer: Self-pay | Admitting: Dermatology

## 2011-02-04 DIAGNOSIS — D485 Neoplasm of uncertain behavior of skin: Secondary | ICD-10-CM | POA: Diagnosis not present

## 2011-02-04 DIAGNOSIS — D0460 Carcinoma in situ of skin of unspecified upper limb, including shoulder: Secondary | ICD-10-CM | POA: Diagnosis not present

## 2011-02-04 DIAGNOSIS — L259 Unspecified contact dermatitis, unspecified cause: Secondary | ICD-10-CM | POA: Diagnosis not present

## 2011-02-04 DIAGNOSIS — L570 Actinic keratosis: Secondary | ICD-10-CM | POA: Diagnosis not present

## 2011-02-04 HISTORY — PX: SQUAMOUS CELL CARCINOMA EXCISION: SHX2433

## 2011-02-04 HISTORY — PX: SKIN LESION EXCISION: SHX2412

## 2011-03-07 ENCOUNTER — Emergency Department (HOSPITAL_COMMUNITY): Payer: Medicare Other

## 2011-03-07 ENCOUNTER — Encounter (HOSPITAL_COMMUNITY): Payer: Self-pay | Admitting: *Deleted

## 2011-03-07 ENCOUNTER — Other Ambulatory Visit: Payer: Self-pay

## 2011-03-07 ENCOUNTER — Inpatient Hospital Stay (HOSPITAL_COMMUNITY)
Admission: EM | Admit: 2011-03-07 | Discharge: 2011-03-11 | DRG: 481 | Disposition: A | Discharge status: Skilled Nursing Facility | Payer: Medicare Other | Attending: Specialist | Admitting: Specialist

## 2011-03-07 DIAGNOSIS — E871 Hypo-osmolality and hyponatremia: Secondary | ICD-10-CM | POA: Diagnosis present

## 2011-03-07 DIAGNOSIS — K5900 Constipation, unspecified: Secondary | ICD-10-CM | POA: Diagnosis not present

## 2011-03-07 DIAGNOSIS — D649 Anemia, unspecified: Secondary | ICD-10-CM | POA: Diagnosis not present

## 2011-03-07 DIAGNOSIS — Z7982 Long term (current) use of aspirin: Secondary | ICD-10-CM

## 2011-03-07 DIAGNOSIS — W19XXXA Unspecified fall, initial encounter: Secondary | ICD-10-CM | POA: Diagnosis present

## 2011-03-07 DIAGNOSIS — J90 Pleural effusion, not elsewhere classified: Secondary | ICD-10-CM | POA: Diagnosis present

## 2011-03-07 DIAGNOSIS — S51009A Unspecified open wound of unspecified elbow, initial encounter: Secondary | ICD-10-CM | POA: Diagnosis present

## 2011-03-07 DIAGNOSIS — E876 Hypokalemia: Secondary | ICD-10-CM | POA: Diagnosis present

## 2011-03-07 DIAGNOSIS — S72043A Displaced fracture of base of neck of unspecified femur, initial encounter for closed fracture: Secondary | ICD-10-CM | POA: Diagnosis not present

## 2011-03-07 DIAGNOSIS — Y998 Other external cause status: Secondary | ICD-10-CM

## 2011-03-07 DIAGNOSIS — E861 Hypovolemia: Secondary | ICD-10-CM | POA: Diagnosis not present

## 2011-03-07 DIAGNOSIS — H3530 Unspecified macular degeneration: Secondary | ICD-10-CM | POA: Diagnosis present

## 2011-03-07 DIAGNOSIS — I4510 Unspecified right bundle-branch block: Secondary | ICD-10-CM | POA: Diagnosis not present

## 2011-03-07 DIAGNOSIS — Z923 Personal history of irradiation: Secondary | ICD-10-CM

## 2011-03-07 DIAGNOSIS — I959 Hypotension, unspecified: Secondary | ICD-10-CM | POA: Diagnosis not present

## 2011-03-07 DIAGNOSIS — IMO0002 Reserved for concepts with insufficient information to code with codable children: Secondary | ICD-10-CM | POA: Diagnosis not present

## 2011-03-07 DIAGNOSIS — R918 Other nonspecific abnormal finding of lung field: Secondary | ICD-10-CM | POA: Diagnosis not present

## 2011-03-07 DIAGNOSIS — I9589 Other hypotension: Secondary | ICD-10-CM | POA: Diagnosis not present

## 2011-03-07 DIAGNOSIS — R296 Repeated falls: Secondary | ICD-10-CM | POA: Diagnosis not present

## 2011-03-07 DIAGNOSIS — R0902 Hypoxemia: Secondary | ICD-10-CM | POA: Diagnosis not present

## 2011-03-07 DIAGNOSIS — Y929 Unspecified place or not applicable: Secondary | ICD-10-CM

## 2011-03-07 DIAGNOSIS — S72009D Fracture of unspecified part of neck of unspecified femur, subsequent encounter for closed fracture with routine healing: Secondary | ICD-10-CM | POA: Diagnosis not present

## 2011-03-07 DIAGNOSIS — Z853 Personal history of malignant neoplasm of breast: Secondary | ICD-10-CM

## 2011-03-07 DIAGNOSIS — J984 Other disorders of lung: Secondary | ICD-10-CM | POA: Diagnosis not present

## 2011-03-07 DIAGNOSIS — D500 Iron deficiency anemia secondary to blood loss (chronic): Secondary | ICD-10-CM | POA: Diagnosis present

## 2011-03-07 DIAGNOSIS — M25559 Pain in unspecified hip: Secondary | ICD-10-CM | POA: Diagnosis not present

## 2011-03-07 DIAGNOSIS — G4700 Insomnia, unspecified: Secondary | ICD-10-CM | POA: Diagnosis not present

## 2011-03-07 DIAGNOSIS — Z9181 History of falling: Secondary | ICD-10-CM | POA: Diagnosis not present

## 2011-03-07 DIAGNOSIS — S72001A Fracture of unspecified part of neck of right femur, initial encounter for closed fracture: Secondary | ICD-10-CM

## 2011-03-07 DIAGNOSIS — Z5189 Encounter for other specified aftercare: Secondary | ICD-10-CM | POA: Diagnosis not present

## 2011-03-07 DIAGNOSIS — S72009A Fracture of unspecified part of neck of unspecified femur, initial encounter for closed fracture: Secondary | ICD-10-CM | POA: Diagnosis present

## 2011-03-07 DIAGNOSIS — M4120 Other idiopathic scoliosis, site unspecified: Secondary | ICD-10-CM | POA: Diagnosis not present

## 2011-03-07 DIAGNOSIS — J9819 Other pulmonary collapse: Secondary | ICD-10-CM | POA: Diagnosis not present

## 2011-03-07 DIAGNOSIS — H9190 Unspecified hearing loss, unspecified ear: Secondary | ICD-10-CM | POA: Diagnosis present

## 2011-03-07 DIAGNOSIS — I2510 Atherosclerotic heart disease of native coronary artery without angina pectoris: Secondary | ICD-10-CM | POA: Diagnosis not present

## 2011-03-07 HISTORY — DX: Unspecified hearing loss, unspecified ear: H91.90

## 2011-03-07 HISTORY — DX: Malignant (primary) neoplasm, unspecified: C80.1

## 2011-03-07 HISTORY — DX: Unspecified macular degeneration: H35.30

## 2011-03-07 LAB — URINALYSIS, ROUTINE W REFLEX MICROSCOPIC
Glucose, UA: NEGATIVE mg/dL
Ketones, ur: NEGATIVE mg/dL
Leukocytes, UA: NEGATIVE
Nitrite: NEGATIVE
Specific Gravity, Urine: 1.011 (ref 1.005–1.030)
pH: 6.5 (ref 5.0–8.0)

## 2011-03-07 LAB — CBC
Hemoglobin: 11.7 g/dL — ABNORMAL LOW (ref 12.0–15.0)
MCH: 29.6 pg (ref 26.0–34.0)
RBC: 3.95 MIL/uL (ref 3.87–5.11)
WBC: 11.1 10*3/uL — ABNORMAL HIGH (ref 4.0–10.5)

## 2011-03-07 LAB — BASIC METABOLIC PANEL
CO2: 27 mEq/L (ref 19–32)
Chloride: 101 mEq/L (ref 96–112)
GFR calc Af Amer: 86 mL/min — ABNORMAL LOW (ref >90)
Potassium: 3.4 mEq/L — ABNORMAL LOW (ref 3.5–5.1)
Sodium: 135 mEq/L (ref 135–145)

## 2011-03-07 MED ORDER — MORPHINE SULFATE 2 MG/ML IJ SOLN
0.5000 mg | INTRAMUSCULAR | Status: DC | PRN
  Administered 2011-03-08 (×2): 0.5 mg via INTRAVENOUS
  Filled 2011-03-07 (×2): qty 1

## 2011-03-07 MED ORDER — METHOCARBAMOL 500 MG PO TABS: 500.0000 mg | ORAL_TABLET | Freq: Four times a day (QID) | ORAL | Status: DC | PRN

## 2011-03-07 MED ORDER — SODIUM CHLORIDE 0.9 % IV SOLN
INTRAVENOUS | Status: AC
  Administered 2011-03-07: 22:00:00 via INTRAVENOUS

## 2011-03-07 MED ORDER — METHOCARBAMOL 100 MG/ML IJ SOLN
500.0000 mg | Freq: Four times a day (QID) | INTRAVENOUS | Status: DC | PRN
  Filled 2011-03-07: qty 5

## 2011-03-07 MED ORDER — CHOLECALCIFEROL 10 MCG (400 UNIT) PO TABS
400.0000 [IU] | ORAL_TABLET | Freq: Every day | ORAL | Status: DC
  Administered 2011-03-09 – 2011-03-11 (×3): 400 [IU] via ORAL
  Filled 2011-03-07 (×5): qty 1

## 2011-03-07 MED ORDER — KCL IN DEXTROSE-NACL 10-5-0.45 MEQ/L-%-% IV SOLN
INTRAVENOUS | Status: DC
  Administered 2011-03-08: 04:00:00 via INTRAVENOUS
  Filled 2011-03-07 (×4): qty 1000

## 2011-03-07 MED ORDER — HYDROCODONE-ACETAMINOPHEN 5-325 MG PO TABS: 1.0000 | ORAL_TABLET | ORAL | Status: DC | PRN

## 2011-03-07 MED ORDER — ZOLPIDEM TARTRATE 5 MG PO TABS: 5.0000 mg | ORAL_TABLET | Freq: Every day | ORAL | Status: DC

## 2011-03-07 MED ORDER — HYDROMORPHONE HCL PF 1 MG/ML IJ SOLN
0.5000 mg | Freq: Once | INTRAMUSCULAR | Status: AC
  Administered 2011-03-07: 0.5 mg via INTRAVENOUS
  Filled 2011-03-07: qty 1

## 2011-03-07 MED ORDER — DIAZEPAM 5 MG/ML IJ SOLN
5.0000 mg | Freq: Once | INTRAMUSCULAR | Status: AC
  Administered 2011-03-08: via INTRAVENOUS
  Filled 2011-03-07: qty 2

## 2011-03-07 MED ORDER — MORPHINE SULFATE 4 MG/ML IJ SOLN: 4.0000 mg | INTRAMUSCULAR | Status: DC | PRN

## 2011-03-07 MED ORDER — ONDANSETRON HCL 4 MG/2ML IJ SOLN
4.0000 mg | Freq: Once | INTRAMUSCULAR | Status: AC
  Administered 2011-03-07: 4 mg via INTRAVENOUS
  Filled 2011-03-07: qty 2

## 2011-03-07 NOTE — ED Provider Notes (Signed)
Medical screening examination/treatment/procedure(s) were conducted as a shared visit with non-physician practitioner(s) and myself.  I personally evaluated the patient during the encounter  Personally reviewed the patient's x-rays.  She has a right femoral neck fracture.  The patient's pain is now controlled.  Her right leg is neurovascularly intact.  She is an ASA class I/II. I spoke with the orthopedic surgeon Dr. Shelle Iron who agrees to admit the patient to the hospital.  Temporary admission orders were placed by me.  The patient and the family been updated.  She'll be n.p.o. after midnight.   Date: 03/07/2011  Rate: 94  Rhythm: normal sinus rhythm  QRS Axis: normal  Intervals: normal  ST/T Wave abnormalities: normal  Conduction Disutrbances: Right bundle branch block  Narrative Interpretation:   Old EKG Reviewed: No prior EKGs available  1. Fracture of femoral neck, right    Results for orders placed during the hospital encounter of 03/07/11  CBC      Component Value Range   WBC 11.1 (*) 4.0 - 10.5 (K/uL)   RBC 3.95  3.87 - 5.11 (MIL/uL)   Hemoglobin 11.7 (*) 12.0 - 15.0 (g/dL)   HCT 16.1 (*) 09.6 - 46.0 (%)   MCV 90.1  78.0 - 100.0 (fL)   MCH 29.6  26.0 - 34.0 (pg)   MCHC 32.9  30.0 - 36.0 (g/dL)   RDW 04.5  40.9 - 81.1 (%)   Platelets 208  150 - 400 (K/uL)  URINALYSIS, ROUTINE W REFLEX MICROSCOPIC      Component Value Range   Color, Urine YELLOW  YELLOW    APPearance CLEAR  CLEAR    Specific Gravity, Urine 1.011  1.005 - 1.030    pH 6.5  5.0 - 8.0    Glucose, UA NEGATIVE  NEGATIVE (mg/dL)   Hgb urine dipstick NEGATIVE  NEGATIVE    Bilirubin Urine NEGATIVE  NEGATIVE    Ketones, ur NEGATIVE  NEGATIVE (mg/dL)   Protein, ur NEGATIVE  NEGATIVE (mg/dL)   Urobilinogen, UA 0.2  0.0 - 1.0 (mg/dL)   Nitrite NEGATIVE  NEGATIVE    Leukocytes, UA NEGATIVE  NEGATIVE   BASIC METABOLIC PANEL      Component Value Range   Sodium 135  135 - 145 (mEq/L)   Potassium 3.4 (*) 3.5 - 5.1  (mEq/L)   Chloride 101  96 - 112 (mEq/L)   CO2 27  19 - 32 (mEq/L)   Glucose, Bld 158 (*) 70 - 99 (mg/dL)   BUN 10  6 - 23 (mg/dL)   Creatinine, Ser 9.14  0.50 - 1.10 (mg/dL)   Calcium 8.3 (*) 8.4 - 10.5 (mg/dL)   GFR calc non Af Amer 74 (*) >90 (mL/min)   GFR calc Af Amer 86 (*) >90 (mL/min)   Dg Chest 2 View  03/07/2011  *RADIOLOGY REPORT*  Clinical Data: Status post fall; concern for chest injury.  CHEST - 2 VIEW  Comparison: Chest radiograph performed 05/29/2009  Findings: The lungs are well-aerated.  Chronically increased lung markings are noted, with mild underlying scarring seen.  This is particularly prominent at the right lung apex.  Bilateral lung densities appear unchanged from 2011.  There is no evidence of pleural effusion or pneumothorax.  The heart is normal in size; the mediastinal contour is within normal limits.  No acute osseous abnormalities are seen.  Apparent healed right-sided rib fractures are noted, new from 2011.  Clips are noted at the right axilla.  IMPRESSION:  1.  Chronic lung changes  again noted; no acute cardiopulmonary process seen.  No acute displaced rib fractures identified. 2.  Apparent healed right-sided rib fractures, new from 2011.  Original Report Authenticated By: Tonia Ghent, M.D.   Dg Elbow Complete Right  03/07/2011  *RADIOLOGY REPORT*  Clinical Data: Fall with right elbow laceration.  RIGHT ELBOW - COMPLETE 3+ VIEW  Comparison: None.  Findings: Bony demineralization noted. No fracture, foreign body, or acute bony findings are identified.  The anterior humeral line intersects the middle third of the capitellum.  No elbow effusion noted.  IMPRESSION:  1.  Bony demineralization.  No acute bony findings.  Original Report Authenticated By: Dellia Cloud, M.D.   Dg Hip Complete Right  03/07/2011  *RADIOLOGY REPORT*  Clinical Data: Fall.  Right hip pain.  RIGHT HIP - COMPLETE 2+ VIEW  Comparison: 11/19/2004  Findings: Lower lumbar rotary scoliosis  noted.  There is diffuse bony demineralization.  There is spurring in the right femoral neck region in addition to apparent cortical discontinuity compatible with acute right femoral neck fracture.  No fracture of the bony pelvis is observed.  IMPRESSION:  1.  Acute right femoral neck fracture. 2.  Rotary scoliosis in the lower lumbar spine.  Original Report Authenticated By: Dellia Cloud, M.D.       Lyanne Co, MD 03/07/11 [phone]

## 2011-03-07 NOTE — ED Provider Notes (Signed)
History     CSN: [id number]  Arrival date & time 03/07/11  1933   First MD Initiated Contact with Patient 03/07/11 2004      Chief Complaint  Patient presents with  . Fall    (Consider location/radiation/quality/duration/timing/severity/associated sxs/prior treatment) HPI Comments: Patient here with daughter - she fell while bending over to pick up an item on the ground that had dropped - she states that she lost her balance and fell onto her right side - here with right elbow pain with skin tear to the lateral portion of the elbow - also with right hip pain - was able to ambulate with daughter's assistance to the car, but when they arrived home she felt like she was unable to ambulate further - when they came here she required assistance with getting out of the car - main complaint is right hip pain with movement and ambulation.  Patient is a 78 y.o. female presenting with fall. The history is provided by the patient and a relative. No language interpreter was used.  Fall The accident occurred 1 to 2 hours ago. The fall occurred while walking. She fell from a height of 3 to 5 ft. She landed on concrete. The volume of blood lost was minimal. The point of impact was the right elbow and right hip. The pain is present in the right elbow and right hip. The pain is at a severity of 6/10. The pain is moderate. She was not ambulatory at the scene. There was no entrapment after the fall. There was no drug use involved in the accident. There was no alcohol use involved in the accident. Pertinent negatives include no visual change, no fever, no numbness, no abdominal pain, no bowel incontinence, no nausea, no vomiting, no hematuria, no headaches, no hearing loss, no loss of consciousness and no tingling. The symptoms are aggravated by activity, standing and ambulation. She has tried nothing for the symptoms. The treatment provided no relief.    History reviewed. No pertinent past medical  history.  History reviewed. No pertinent past surgical history.  History reviewed. No pertinent family history.  History  Substance Use Topics  . Smoking status: Not on file  . Smokeless tobacco: Not on file  . Alcohol Use: Not on file    OB History    Grav Para Term Preterm Abortions TAB SAB Ect Mult Living                  Review of Systems  Constitutional: Negative for fever.  Gastrointestinal: Negative for nausea, vomiting, abdominal pain and bowel incontinence.  Genitourinary: Negative for hematuria.  Musculoskeletal: Positive for arthralgias and gait problem. Negative for back pain.  Neurological: Negative for tingling, loss of consciousness, numbness and headaches.  All other systems reviewed and are negative.    Allergies  Review of patient's allergies indicates no known allergies.  Home Medications   Current Outpatient Rx  Name Route Sig Dispense Refill  . ASPIRIN EC 81 MG PO TBEC Oral Take 81 mg by mouth daily.    Marland Kitchen BILBERRY 100 MG PO CAPS Oral Take 1 capsule by mouth daily.    . CHOLECALCIFEROL 400 UNITS PO TABS Oral Take 400 Units by mouth daily.    Marland Kitchen GLUCOSAMINE-CHONDROITIN 500-400 MG PO TABS Oral Take 1 tablet by mouth 2 (two) times daily.    Marland Kitchen PRESERVISION AREDS 2 PO CAPS Oral Take 2 capsules by mouth daily.    . RESTORA PO CAPS Oral Take 1 capsule  by mouth daily.    Marland Kitchen ZOLPIDEM TARTRATE 5 MG PO TABS Oral Take 5 mg by mouth at bedtime as needed. sleep      BP 171/73  Pulse 93  Temp(Src) 97.8 F (36.6 C) (Oral)  Resp 16  SpO2 95%  Physical Exam  Nursing note and vitals reviewed. Constitutional: She is oriented to person, place, and time. She appears well-developed and well-nourished. No distress.  HENT:  Head: Normocephalic and atraumatic.  Right Ear: External ear normal.  Left Ear: External ear normal.  Nose: Nose normal.  Mouth/Throat: Oropharynx is clear and moist. No oropharyngeal exudate.  Eyes: Conjunctivae are normal. Pupils are equal,  round, and reactive to light. No scleral icterus.  Neck: Normal range of motion. Neck supple. No spinous process tenderness and no muscular tenderness present.  Cardiovascular: Normal rate, regular rhythm and normal heart sounds.  Exam reveals no gallop and no friction rub.   No murmur heard. Pulmonary/Chest: Effort normal and breath sounds normal. No respiratory distress. She exhibits no tenderness.  Abdominal: Soft. Bowel sounds are normal. She exhibits no distension. There is no tenderness.  Musculoskeletal:       Right elbow: She exhibits laceration. She exhibits normal range of motion, no swelling and no effusion.       Right hip: She exhibits decreased strength, tenderness and bony tenderness. She exhibits normal range of motion and no deformity.       Arms: Lymphadenopathy:    She has no cervical adenopathy.  Neurological: She is alert and oriented to person, place, and time. No cranial nerve deficit.  Skin: Skin is warm and dry. No rash noted. No erythema. No pallor.  Psychiatric: She has a normal mood and affect. Her behavior is normal. Judgment and thought content normal.    ED Course  Procedures (including critical care time)  Labs Reviewed - No data to display Dg Elbow Complete Right  03/07/2011  *RADIOLOGY REPORT*  Clinical Data: Fall with right elbow laceration.  RIGHT ELBOW - COMPLETE 3+ VIEW  Comparison: None.  Findings: Bony demineralization noted. No fracture, foreign body, or acute bony findings are identified.  The anterior humeral line intersects the middle third of the capitellum.  No elbow effusion noted.  IMPRESSION:  1.  Bony demineralization.  No acute bony findings.  Original Report Authenticated By: Dellia Cloud, M.D.     Right femoral neck fracture    MDM  Patient with right femoral neck fracture - spoke with Dr. Shelle Iron - is AHA cat 1 - will admit solely to ortho.          Izola Price [place], [place] 03/07/11 2238

## 2011-03-07 NOTE — H&P (Signed)
Cassandra Glenn is an 78 y.o. female.   Chief Complaint: right hip pain MVH:QION today on right hip.  History reviewed. No pertinent past medical history.  History reviewed. No pertinent past surgical history.  History reviewed. No pertinent family history. Social History:  does not have a smoking history on file. She does not have any smokeless tobacco history on file. Her alcohol and drug histories not on file.  Allergies: No Known Allergies  No current facility-administered medications on file as of 03/07/2011.   No current outpatient prescriptions on file as of 03/07/2011.    No results found for this or any previous visit (from the past 48 hour(s)). Dg Elbow Complete Right  03/07/2011  *RADIOLOGY REPORT*  Clinical Data: Fall with right elbow laceration.  RIGHT ELBOW - COMPLETE 3+ VIEW  Comparison: None.  Findings: Bony demineralization noted. No fracture, foreign body, or acute bony findings are identified.  The anterior humeral line intersects the middle third of the capitellum.  No elbow effusion noted.  IMPRESSION:  1.  Bony demineralization.  No acute bony findings.  Original Report Authenticated By: Dellia Cloud, M.D.   Dg Hip Complete Right  03/07/2011  *RADIOLOGY REPORT*  Clinical Data: Fall.  Right hip pain.  RIGHT HIP - COMPLETE 2+ VIEW  Comparison: 11/19/2004  Findings: Lower lumbar rotary scoliosis noted.  There is diffuse bony demineralization.  There is spurring in the right femoral neck region in addition to apparent cortical discontinuity compatible with acute right femoral neck fracture.  No fracture of the bony pelvis is observed.  IMPRESSION:  1.  Acute right femoral neck fracture. 2.  Rotary scoliosis in the lower lumbar spine.  Original Report Authenticated By: Dellia Cloud, M.D.    Review of Systems  Respiratory: Negative.   Cardiovascular: Negative.   Musculoskeletal: Positive for joint pain.  Neurological: Negative.   Endo/Heme/Allergies:  Negative.   Psychiatric/Behavioral: Negative.   All other systems reviewed and are negative.    Blood pressure 171/73, pulse 93, temperature 97.8 F (36.6 C), temperature source Oral, resp. rate 16, SpO2 95.00%. Physical Exam  Constitutional: She is oriented to person, place, and time. She appears well-developed.  HENT:  Head: Normocephalic.  Eyes: Pupils are equal, round, and reactive to light.  Neck: Normal range of motion. Neck supple.  Cardiovascular: Normal rate.   Respiratory: Effort normal.  GI: Soft.  Musculoskeletal: She exhibits tenderness.       Pain left hip.NVI, no DVT  Neurological: She is alert and oriented to person, place, and time.  Skin: Skin is warm.  Psychiatric: She has a normal mood and affect.    Pain right hip Assessment/Plan Right femoral neck fracture. Plan ORIF vs hemiarthroplasty. Check Labs.  Navina Wohlers C 03/07/2011, 9:43 PM

## 2011-03-07 NOTE — ED Notes (Signed)
Pt reports she was reaching over too far and lost her balance. Pt reporting she was unable to ambulate after fall. Pt reports pain to right hip and right elbow. Pt has a skin tear to right elbow and right palmer hand. Bleeding controlled. Pt denies hitting head. Pt denies LOC

## 2011-03-07 NOTE — ED Notes (Signed)
Pt in s/p fall, c/o right hip pain and skin tear to right elbow, denies hitting head, pt ambulating with difficulty and increased pain

## 2011-03-08 ENCOUNTER — Encounter (HOSPITAL_COMMUNITY): Payer: Self-pay

## 2011-03-08 ENCOUNTER — Encounter (HOSPITAL_COMMUNITY)
Admission: EM | Disposition: A | Discharge status: Skilled Nursing Facility | Payer: Self-pay | Source: Home / Self Care | Attending: Specialist

## 2011-03-08 ENCOUNTER — Inpatient Hospital Stay (HOSPITAL_COMMUNITY): Payer: Medicare Other | Admitting: Anesthesiology

## 2011-03-08 ENCOUNTER — Inpatient Hospital Stay (HOSPITAL_COMMUNITY): Payer: Medicare Other

## 2011-03-08 ENCOUNTER — Encounter (HOSPITAL_COMMUNITY): Payer: Self-pay | Admitting: Anesthesiology

## 2011-03-08 DIAGNOSIS — E871 Hypo-osmolality and hyponatremia: Secondary | ICD-10-CM | POA: Diagnosis not present

## 2011-03-08 DIAGNOSIS — S72043A Displaced fracture of base of neck of unspecified femur, initial encounter for closed fracture: Secondary | ICD-10-CM | POA: Diagnosis not present

## 2011-03-08 DIAGNOSIS — S51009A Unspecified open wound of unspecified elbow, initial encounter: Secondary | ICD-10-CM | POA: Diagnosis not present

## 2011-03-08 DIAGNOSIS — IMO0002 Reserved for concepts with insufficient information to code with codable children: Secondary | ICD-10-CM | POA: Diagnosis not present

## 2011-03-08 DIAGNOSIS — J90 Pleural effusion, not elsewhere classified: Secondary | ICD-10-CM | POA: Diagnosis not present

## 2011-03-08 DIAGNOSIS — S72009A Fracture of unspecified part of neck of unspecified femur, initial encounter for closed fracture: Secondary | ICD-10-CM | POA: Diagnosis not present

## 2011-03-08 DIAGNOSIS — R296 Repeated falls: Secondary | ICD-10-CM | POA: Diagnosis not present

## 2011-03-08 HISTORY — PX: ORIF HIP FRACTURE: SHX2125

## 2011-03-08 HISTORY — PX: HIP PINNING,CANNULATED: SHX1758

## 2011-03-08 LAB — ABO/RH: ABO/RH(D): A POS

## 2011-03-08 LAB — TYPE AND SCREEN: Antibody Screen: NEGATIVE

## 2011-03-08 LAB — PROTIME-INR: Prothrombin Time: 14.5 seconds (ref 11.6–15.2)

## 2011-03-08 LAB — SURGICAL PCR SCREEN: MRSA, PCR: NEGATIVE

## 2011-03-08 SURGERY — FIXATION, FEMUR, NECK, PERCUTANEOUS, USING SCREW
Anesthesia: General | Site: Hip | Laterality: Right | Wound class: Clean

## 2011-03-08 MED ORDER — METOCLOPRAMIDE HCL 10 MG PO TABS: 5.0000 mg | ORAL_TABLET | Freq: Three times a day (TID) | ORAL | Status: DC | PRN

## 2011-03-08 MED ORDER — EPHEDRINE SULFATE 50 MG/ML IJ SOLN
INTRAMUSCULAR | Status: DC | PRN
  Administered 2011-03-08: 15 mg via INTRAVENOUS

## 2011-03-08 MED ORDER — ACETAMINOPHEN 325 MG PO TABS: 650.0000 mg | ORAL_TABLET | Freq: Four times a day (QID) | ORAL | Status: DC | PRN

## 2011-03-08 MED ORDER — CEFAZOLIN SODIUM 1-5 GM-% IV SOLN
1.0000 g | Freq: Four times a day (QID) | INTRAVENOUS | Status: AC
Start: 2011-03-09 — End: 2011-03-09
  Administered 2011-03-09 (×3): 1 g via INTRAVENOUS
  Filled 2011-03-08 (×5): qty 50

## 2011-03-08 MED ORDER — PHENOL 1.4 % MT LIQD: 1.0000 | OROMUCOSAL | Status: DC | PRN

## 2011-03-08 MED ORDER — FENTANYL CITRATE 0.05 MG/ML IJ SOLN
25.0000 ug | INTRAMUSCULAR | Status: DC | PRN
  Administered 2011-03-08 (×2): 25 ug via INTRAVENOUS

## 2011-03-08 MED ORDER — METHOCARBAMOL 500 MG PO TABS
500.0000 mg | ORAL_TABLET | Freq: Four times a day (QID) | ORAL | Status: DC | PRN
  Administered 2011-03-08 – 2011-03-11 (×3): 500 mg via ORAL
  Filled 2011-03-08 (×3): qty 1

## 2011-03-08 MED ORDER — LACTATED RINGERS IV SOLN
INTRAVENOUS | Status: DC
  Administered 2011-03-08: 1000 mL via INTRAVENOUS

## 2011-03-08 MED ORDER — HYDROCODONE-ACETAMINOPHEN 5-325 MG PO TABS
1.0000 | ORAL_TABLET | ORAL | Status: DC | PRN
  Administered 2011-03-08 – 2011-03-10 (×7): 1 via ORAL
  Administered 2011-03-11 (×2): 2 via ORAL
  Filled 2011-03-08: qty 2
  Filled 2011-03-08 (×2): qty 1
  Filled 2011-03-08: qty 2
  Filled 2011-03-08 (×5): qty 1

## 2011-03-08 MED ORDER — ONDANSETRON HCL 4 MG/2ML IJ SOLN
INTRAMUSCULAR | Status: DC | PRN
  Administered 2011-03-08: 4 mg via INTRAVENOUS

## 2011-03-08 MED ORDER — MENTHOL 3 MG MT LOZG
1.0000 | LOZENGE | OROMUCOSAL | Status: DC | PRN
  Administered 2011-03-09: 3 mg via ORAL

## 2011-03-08 MED ORDER — 0.9 % SODIUM CHLORIDE (POUR BTL) OPTIME
TOPICAL | Status: DC | PRN
  Administered 2011-03-08: 1000 mL

## 2011-03-08 MED ORDER — ETOMIDATE 2 MG/ML IV SOLN
INTRAVENOUS | Status: DC | PRN
  Administered 2011-03-08: 12 mg via INTRAVENOUS

## 2011-03-08 MED ORDER — MORPHINE SULFATE 2 MG/ML IJ SOLN
INTRAMUSCULAR | Status: AC
  Administered 2011-03-08: 1 mg via INTRAVENOUS
  Filled 2011-03-08: qty 1

## 2011-03-08 MED ORDER — LACTATED RINGERS IV SOLN
INTRAVENOUS | Status: DC | PRN
  Administered 2011-03-08 (×2): via INTRAVENOUS

## 2011-03-08 MED ORDER — FENTANYL CITRATE 0.05 MG/ML IJ SOLN
INTRAMUSCULAR | Status: DC | PRN
  Administered 2011-03-08: 50 ug via INTRAVENOUS
  Administered 2011-03-08 (×2): 25 ug via INTRAVENOUS

## 2011-03-08 MED ORDER — POLYETHYLENE GLYCOL 3350 17 G PO PACK
17.0000 g | PACK | Freq: Every day | ORAL | Status: DC | PRN
  Filled 2011-03-08: qty 1

## 2011-03-08 MED ORDER — LACTATED RINGERS IV SOLN: INTRAVENOUS | Status: DC

## 2011-03-08 MED ORDER — SODIUM CHLORIDE 0.9 % IV SOLN
INTRAVENOUS | Status: DC
  Administered 2011-03-08 – 2011-03-10 (×4): via INTRAVENOUS

## 2011-03-08 MED ORDER — ACETAMINOPHEN 650 MG RE SUPP: 650.0000 mg | Freq: Four times a day (QID) | RECTAL | Status: DC | PRN

## 2011-03-08 MED ORDER — ONDANSETRON HCL 4 MG/2ML IJ SOLN: 4.0000 mg | Freq: Four times a day (QID) | INTRAMUSCULAR | Status: DC | PRN

## 2011-03-08 MED ORDER — ONDANSETRON HCL 4 MG PO TABS: 4.0000 mg | ORAL_TABLET | Freq: Four times a day (QID) | ORAL | Status: DC | PRN

## 2011-03-08 MED ORDER — ACETAMINOPHEN 10 MG/ML IV SOLN
INTRAVENOUS | Status: DC | PRN
  Administered 2011-03-08: 1000 mg via INTRAVENOUS

## 2011-03-08 MED ORDER — METOCLOPRAMIDE HCL 5 MG/ML IJ SOLN: 5.0000 mg | Freq: Three times a day (TID) | INTRAMUSCULAR | Status: DC | PRN

## 2011-03-08 MED ORDER — CEFAZOLIN SODIUM 1-5 GM-% IV SOLN
INTRAVENOUS | Status: DC | PRN
  Administered 2011-03-08: 1 g via INTRAVENOUS

## 2011-03-08 MED ORDER — MORPHINE SULFATE 2 MG/ML IJ SOLN: 1.0000 mg | Freq: Once | INTRAMUSCULAR | Status: DC

## 2011-03-08 MED ORDER — DEXTROSE 5 % IV SOLN
500.0000 mg | Freq: Four times a day (QID) | INTRAVENOUS | Status: DC | PRN
  Filled 2011-03-08: qty 5

## 2011-03-08 MED ORDER — PROMETHAZINE HCL 25 MG/ML IJ SOLN: 6.2500 mg | INTRAMUSCULAR | Status: DC | PRN

## 2011-03-08 MED ORDER — SUCCINYLCHOLINE CHLORIDE 20 MG/ML IJ SOLN
INTRAMUSCULAR | Status: DC | PRN
  Administered 2011-03-08: 80 mg via INTRAVENOUS

## 2011-03-08 MED ORDER — DOCUSATE SODIUM 100 MG PO CAPS
100.0000 mg | ORAL_CAPSULE | Freq: Two times a day (BID) | ORAL | Status: DC
  Administered 2011-03-08 – 2011-03-11 (×6): 100 mg via ORAL
  Filled 2011-03-08 (×9): qty 1

## 2011-03-08 MED ORDER — MORPHINE SULFATE 2 MG/ML IJ SOLN
1.0000 mg | INTRAMUSCULAR | Status: DC | PRN
  Administered 2011-03-08: 1 mg via INTRAVENOUS
  Administered 2011-03-08: 2 mg via INTRAVENOUS
  Administered 2011-03-08: 1 mg via INTRAVENOUS
  Administered 2011-03-08: 2 mg via INTRAVENOUS
  Filled 2011-03-08 (×4): qty 1

## 2011-03-08 SURGICAL SUPPLY — 46 items
BAG ZIPLOCK 12X15 (MISCELLANEOUS) IMPLANT
BANDAGE GAUZE ELAST BULKY 4 IN (GAUZE/BANDAGES/DRESSINGS) ×2 IMPLANT
BNDG COHESIVE 6X5 TAN STRL LF (GAUZE/BANDAGES/DRESSINGS) ×2 IMPLANT
CHLORAPREP W/TINT 26ML (MISCELLANEOUS) IMPLANT
CLOTH BEACON ORANGE TIMEOUT ST (SAFETY) ×2 IMPLANT
DRAPE STERI IOBAN 125X83 (DRAPES) ×2 IMPLANT
DRSG EMULSION OIL 3X16 NADH (GAUZE/BANDAGES/DRESSINGS) ×2 IMPLANT
DRSG MEPILEX BORDER 4X8 (GAUZE/BANDAGES/DRESSINGS) ×4 IMPLANT
DRSG PAD ABDOMINAL 8X10 ST (GAUZE/BANDAGES/DRESSINGS) ×4 IMPLANT
DURAPREP 26ML APPLICATOR (WOUND CARE) ×2 IMPLANT
ELECT REM PT RETURN 9FT ADLT (ELECTROSURGICAL) ×2
ELECTRODE REM PT RTRN 9FT ADLT (ELECTROSURGICAL) ×1 IMPLANT
GLOVE BIO SURGEON STRL SZ 6.5 (GLOVE) ×2 IMPLANT
GLOVE BIOGEL PI IND STRL 8 (GLOVE) ×2 IMPLANT
GLOVE BIOGEL PI INDICATOR 8 (GLOVE) ×2
GLOVE ECLIPSE 6.5 STRL STRAW (GLOVE) IMPLANT
GLOVE INDICATOR 6.5 STRL GRN (GLOVE) ×2 IMPLANT
GLOVE SURG SS PI 6.5 STRL IVOR (GLOVE) ×2 IMPLANT
GLOVE SURG SS PI 8.0 STRL IVOR (GLOVE) ×6 IMPLANT
GLOVE SURG SS PI 8.5 STRL IVOR (GLOVE) ×2
GLOVE SURG SS PI 8.5 STRL STRW (GLOVE) ×2 IMPLANT
GOWN PREVENTION PLUS LG XLONG (DISPOSABLE) ×2 IMPLANT
GOWN PREVENTION PLUS XLARGE (GOWN DISPOSABLE) ×2 IMPLANT
GOWN STRL REIN XL XLG (GOWN DISPOSABLE) ×2 IMPLANT
KIT BASIN OR (CUSTOM PROCEDURE TRAY) ×2 IMPLANT
MANIFOLD NEPTUNE II (INSTRUMENTS) ×2 IMPLANT
NS IRRIG 1000ML POUR BTL (IV SOLUTION) ×2 IMPLANT
PACK GENERAL/GYN (CUSTOM PROCEDURE TRAY) ×2 IMPLANT
PAD CAST 4YDX4 CTTN HI CHSV (CAST SUPPLIES) ×2 IMPLANT
PADDING CAST COTTON 4X4 STRL (CAST SUPPLIES) ×2
PIN THREADED GUIDE ACE (PIN) ×8 IMPLANT
POSITIONER SURGICAL ARM (MISCELLANEOUS) ×2 IMPLANT
SCREW CANN 6.5 80MM (Screw) ×3 IMPLANT
SCREW CANN 6.5 85MM (Screw) ×1 IMPLANT
SCREW CANN LG 6.5 FLT 80X22 (Screw) ×3 IMPLANT
SCREW CANN LG 6.5 FLT 85X22 (Screw) ×1 IMPLANT
SPONGE GAUZE 4X4 12PLY (GAUZE/BANDAGES/DRESSINGS) ×2 IMPLANT
SPONGE LAP 18X18 X RAY DECT (DISPOSABLE) IMPLANT
STAPLER VISISTAT (STAPLE) ×2 IMPLANT
STAPLER VISISTAT 35W (STAPLE) IMPLANT
SUT VIC AB 1 CT1 27 (SUTURE) ×2
SUT VIC AB 1 CT1 27XBRD ANTBC (SUTURE) ×2 IMPLANT
SUT VIC AB 2-0 CT1 27 (SUTURE) ×2
SUT VIC AB 2-0 CT1 27XBRD (SUTURE) ×2 IMPLANT
TRAY FOLEY CATH 14FRSI W/METER (CATHETERS) IMPLANT
WATER STERILE IRR 1500ML POUR (IV SOLUTION) IMPLANT

## 2011-03-08 NOTE — Progress Notes (Signed)
Pt received from PACU in bed. A&Ox3. Focused assessment done. Good CMS, dsg c/d/i. Taking sips of water. Medicated for 4/10 incisional pain. POC discussed. Callbell and bedside table in reach. No further c/o at present.

## 2011-03-08 NOTE — Brief Op Note (Signed)
03/07/2011 - 03/08/2011  5:30 PM  PATIENT:  Cassandra Glenn  78 y.o. female  PRE-OPERATIVE DIAGNOSIS:  right femeral neck fracture  POST-OPERATIVE DIAGNOSIS:  right femoral neck fracture  PROCEDURE:  Procedure(s) (LRB): CANNULATED HIP PINNING (Right)  SURGEON:  Surgeon(s) and Role:    * Javier Docker, MD - Primary  PHYSICIAN ASSISTANT:   ASSISTANTS: none   ANESTHESIA:   general  EBL:  Total I/O In: 1920 [I.V.:1920] Out: 1275 [Urine:1075; Blood:200]  BLOOD ADMINISTERED:none  DRAINS: none   LOCAL MEDICATIONS USED:  NONE  SPECIMEN:  No Specimen  DISPOSITION OF SPECIMEN:  N/A  COUNTS:  YES  TOURNIQUET:  * No tourniquets in log *  DICTATION: .Other Dictation: Dictation Number [id number]  PLAN OF CARE: Admit to inpatient   PATIENT DISPOSITION:  PACU - hemodynamically stable.   Delay start of Pharmacological VTE agent (>24hrs) due to surgical blood loss or risk of bleeding: yes

## 2011-03-08 NOTE — Progress Notes (Signed)
Subjective:   Procedure(s) (LRB): OPEN REDUCTION INTERNAL FIXATION HIP (Right) Patient reports pain as 4 on 0-10 scale.   Denies CP or SOB.  Voiding without difficulty. Positive flatus. Objective: Vital signs in last 24 hours: Temp:  [97.8 F (36.6 C)-98.9 F (37.2 C)] 98.9 F (37.2 C) (02/25 0452) Pulse Rate:  [89-103] 89  (02/25 0452) Resp:  [16-20] 18  (02/25 0452) BP: (104-171)/(56-73) 104/56 mmHg (02/25 0452) SpO2:  [95 %-98 %] 98 % (02/25 0452) Weight:  [57 kg (125 lb 10.6 oz)] 57 kg (125 lb 10.6 oz) (02/24 2358)  Intake/Output from previous day: 02/24 0701 - 02/25 0700 In: 837.1 [I.V.:837.1] Out: 850 [Urine:850] Intake/Output this shift:     Basename 03/07/11 2215  HGB 11.7*    Basename 03/07/11 2215  WBC 11.1*  RBC 3.95  HCT 35.6*  PLT 208    Basename 03/07/11 2216  NA 135  K 3.4*  CL 101  CO2 27  BUN 10  CREATININE 0.72  GLUCOSE 158*  CALCIUM 8.3*   No results found for this basename: LABPT:2,INR:2 in the last 72 hours  Neurologically intact ABD soft Sensation intact distally Intact pulses distally Dorsiflexion/Plantar flexion intact Compartment soft  Assessment/Plan:   Procedure(s) (LRB): OPEN REDUCTION INTERNAL FIXATION HIP (Right) Continue foley due to patient critically ill Discussed ORIF vs Hemi. Plan today if OR avail  Dessiree Sze C 03/08/2011, 7:14 AM

## 2011-03-08 NOTE — Anesthesia Preprocedure Evaluation (Signed)
Anesthesia Evaluation  Patient identified by MRN, date of birth, ID band Patient awake    Reviewed: Allergy & Precautions, H&P , NPO status , Patient's Chart, lab work & pertinent test results  History of Anesthesia Complications Negative for: history of anesthetic complications  Airway Mallampati: II TM Distance: >3 FB Neck ROM: Full    Dental  (+) Teeth Intact and Dental Advisory Given   Pulmonary neg pulmonary ROS,  clear to auscultation  Pulmonary exam normal       Cardiovascular neg cardio ROS Regular Normal EKG with right BBB   Neuro/Psych Negative Neurological ROS  Negative Psych ROS   GI/Hepatic negative GI ROS, Neg liver ROS,   Endo/Other  Negative Endocrine ROS  Renal/GU negative Renal ROS  Genitourinary negative   Musculoskeletal negative musculoskeletal ROS (+)   Abdominal   Peds  Hematology negative hematology ROS (+)   Anesthesia Other Findings   Reproductive/Obstetrics negative OB ROS                           Anesthesia Physical Anesthesia Plan  ASA: II  Anesthesia Plan: General   Post-op Pain Management:    Induction: Intravenous  Airway Management Planned: Oral ETT  Additional Equipment:   Intra-op Plan:   Post-operative Plan: Extubation in OR  Informed Consent: I have reviewed the patients History and Physical, chart, labs and discussed the procedure including the risks, benefits and alternatives for the proposed anesthesia with the patient or authorized representative who has indicated his/her understanding and acceptance.   Dental advisory given  Plan Discussed with: CRNA  Anesthesia Plan Comments:         Anesthesia Quick Evaluation

## 2011-03-08 NOTE — Anesthesia Postprocedure Evaluation (Signed)
Anesthesia Post Note  Patient: Cassandra Glenn  Procedure(s) Performed: Procedure(s) (LRB): CANNULATED HIP PINNING (Right)  Anesthesia type: General  Patient location: PACU  Post pain: Pain level controlled  Post assessment: Post-op Vital signs reviewed  Last Vitals:  Filed Vitals:   03/08/11 1815  BP: 109/46  Pulse: 80  Temp:   Resp: 13    Post vital signs: Reviewed  Level of consciousness: sedated  Complications: No apparent anesthesia complications

## 2011-03-08 NOTE — Transfer of Care (Signed)
Immediate Anesthesia Transfer of Care Note  Patient: Cassandra Glenn  Procedure(s) Performed: Procedure(s) (LRB): CANNULATED HIP PINNING (Right)  Patient Location: PACU  Anesthesia Type: General  Level of Consciousness: awake, alert , oriented and patient cooperative  Airway & Oxygen Therapy: Patient Spontanous Breathing and Patient connected to face mask oxygen  Post-op Assessment: Report given to PACU RN and Post -op Vital signs reviewed and stable  Post vital signs: Reviewed and stable  Complications: No apparent anesthesia complications

## 2011-03-09 ENCOUNTER — Encounter (HOSPITAL_COMMUNITY): Payer: Self-pay | Admitting: Neurology

## 2011-03-09 DIAGNOSIS — I959 Hypotension, unspecified: Secondary | ICD-10-CM | POA: Diagnosis not present

## 2011-03-09 DIAGNOSIS — R0902 Hypoxemia: Secondary | ICD-10-CM | POA: Diagnosis not present

## 2011-03-09 DIAGNOSIS — S72009A Fracture of unspecified part of neck of unspecified femur, initial encounter for closed fracture: Secondary | ICD-10-CM | POA: Diagnosis present

## 2011-03-09 LAB — CBC
HCT: 28.9 % — ABNORMAL LOW (ref 36.0–46.0)
MCHC: 33.2 g/dL (ref 30.0–36.0)
MCV: 90.9 fL (ref 78.0–100.0)
RDW: 14.1 % (ref 11.5–15.5)

## 2011-03-09 LAB — URINE CULTURE
Colony Count: NO GROWTH
Culture  Setup Time: 201302250854
Culture: NO GROWTH

## 2011-03-09 LAB — BASIC METABOLIC PANEL
BUN: 6 mg/dL (ref 6–23)
Calcium: 7.8 mg/dL — ABNORMAL LOW (ref 8.4–10.5)
Creatinine, Ser: 0.64 mg/dL (ref 0.50–1.10)
GFR calc Af Amer: 90 mL/min — ABNORMAL LOW (ref >90)
GFR calc non Af Amer: 77 mL/min — ABNORMAL LOW (ref >90)

## 2011-03-09 MED ORDER — ASPIRIN 325 MG PO TABS
325.0000 mg | ORAL_TABLET | Freq: Two times a day (BID) | ORAL | Status: DC
  Administered 2011-03-09 – 2011-03-11 (×4): 325 mg via ORAL
  Filled 2011-03-09 (×7): qty 1

## 2011-03-09 MED ORDER — SODIUM CHLORIDE 0.9 % IV BOLUS (SEPSIS)
500.0000 mL | Freq: Once | INTRAVENOUS | Status: AC
  Administered 2011-03-09: 12:00:00 via INTRAVENOUS

## 2011-03-09 MED ORDER — SODIUM CHLORIDE 0.9 % IV SOLN
Freq: Once | INTRAVENOUS | Status: AC
  Administered 2011-03-10: 01:00:00 via INTRAVENOUS

## 2011-03-09 NOTE — Progress Notes (Signed)
CSW assisting with D/C planning. Met with pt/daughter this am. Pt has recommended [facility] SNF prior to d/c to Independent Living at [facility]. [facility] sent to [facility] requesting admission to their SNF unit. Message left for SW to contact CSW to assist with d/c planning. Awaiting return call. Pt/family would like to consider [facility] if [facility] has no availability. Will follow to assist with d/c planning.

## 2011-03-09 NOTE — Progress Notes (Signed)
Pt very dizzy/pale when up with PT. Just stood at EOB and had to sit back down d/t dizziness. BP 70s/40s on dinamap, 90/42 manually. O2 sat low 80s on RA. Increased to 92-93% on 4lnc. Pt has been working consistently with IS today. Uses effectively to [phone]. Has loose, but NP cough. Roma Schanz, PA, aware of above and new order given for NS bolus. Will administer and monitor closely.

## 2011-03-09 NOTE — Op Note (Signed)
NAMETYLAR, MERENDINO NO.:  [id number]  MEDICAL RECORD NO.:  [id number]  LOCATION:  1605                         FACILITY:  [facility]  PHYSICIAN:  Jene Every, M.D.    DATE OF BIRTH:  08-25-1920  DATE OF PROCEDURE:  03/08/2011 DATE OF DISCHARGE:                              OPERATIVE REPORT   PREOPERATIVE DIAGNOSIS:  Right femoral neck fracture.  POSTOPERATIVE DIAGNOSIS:  Right femoral neck fracture.  PROCEDURE PERFORMED:  ORIF right hip fracture.  ANESTHESIA:  General.  ASSISTANT:  None.  HISTORY:  This 79 year old fell sustaining a valgus impacted neck fracture, indicated for ORIF.  Risks and benefits were discussed including bleeding, infection, malunion, nonunion, need for revision for hemiarthroplasty, DVT, PE, anesthetic complications etc.  TECHNIQUE:  With the patient in supine position, after induction of adequate anesthesia, 1 g Kefzol, placed on fracture table with right lower extremity gentle internal rotation, leg gently flexed, externally rotated.  Foley to gravity, perineal post placed.  The right lower extremity, hip, and peritrochanteric regions were prepped and draped in the usual sterile fashion.  The left lower extremity was in gentle flexion, and external rotation and abduction. The hip was gently internally rotated.  It was reduced under x-ray before we prepped and draped.  Under x-ray in the AP and lateral plane, we had satisfactory valgus impacted after prepping and draping, I made an incision over the trochanter.  The subcutaneous tissue was dissected, electrocautery utilized to achieve hemostasis.  The fascia lata was identified by the line of skin incision.  Hematoma was expressed.  We used a guide and placed 3 cannulated pins up in the femoral neck, somewhat difficult due to the somewhat high neck fracture, parallel on the AP and lateral planes, measured.  We placed three 80 mm screws posteriorly and anteriorly.  There was good  purchase in the subchondral bone.  I broached the cortex in the AP and lateral plane.  Next, we copiously irrigated it.  Again, in the AP and lateral planes, satisfactory fixation of the head.  No motion was noted under x-ray.  We then copiously irrigated and achieved strict hemostasis with electrocautery.  The fascia lata with #1 Vicryl interrupted figure-of- eight sutures, subcu with 2-0 Vicryl simple sutures.  The skin was reapproximated with staples.  The wound was dressed sterilely.  She was taken out of the fracture table.  Leg lengths were equivalent, good pulses.  She was extubated without difficulty, and transported to Recovery in satisfactory condition.  The patient tolerated the procedure well.  No complications.  BLOOD LOSS:  150 mL.     Jene Every, M.D.     Cordelia Pen  D:  03/08/2011  T:  03/09/2011  Job:  [id number]

## 2011-03-09 NOTE — Progress Notes (Signed)
CARE MANAGEMENT NOTE 03/09/2011  Patient:  Cassandra Glenn, Cassandra Glenn   Account Number:  [id number]  Date Initiated:  03/09/2011  Documentation initiated by:  Colleen Can  Subjective/Objective Assessment:   dx rt femoral neck fracture  SWurgery on 02/25-rt hip pinning     Action/Plan:   Pt from independent living  Plans are SNF rehab   Anticipated DC Date:  03/10/2011   Anticipated DC Plan:  SKILLED NURSING FACILITY  In-house referral  Clinical Social Worker      DC Planning Services  NA      [facility] Choice  NA   Choice offered to / List presented to:  NA   DME arranged  NA      DME agency  NA     HH arranged  NA      HH agency  NA   Status of service:  Completed, signed off Medicare Important Message given?  NA - LOS <3 / Initial given by admissions (If response is "NO", the following Medicare IM given date fields will be blank) Date Medicare IM given:   Date Additional Medicare IM given:

## 2011-03-09 NOTE — Evaluation (Signed)
Physical Therapy Evaluation Patient Details Name: Cassandra Glenn MRN: [id number] DOB: 12-31-1920 Today's Date: 03/09/2011  Problem List: There is no problem list on file for this patient.   Past Medical History:  Past Medical History  Diagnosis Date  . Cancer   . Macular degeneration   . HOH (hard of hearing)    Past Surgical History:  Past Surgical History  Procedure Date  . Breast surgery 2005    lumpectomy  . Cataract extraction 2010    bialteral    PT Assessment/Plan/Recommendation PT Assessment Clinical Impression Statement: Pt presents s/p R hip ORIF from femoral neck fx from sustaining a fall at home.  Tolerated bed mobility with increase in pain and with c/o dizziness in upright position.  BP dropped to 90/40 in standing.  Pt returned to bed in supine position to allow for increase in BP.  RN aware.  Will check on pt in pm to determine D/C status.  Pt planning to move in to ALF.   PT Recommendation/Assessment: Patient will need skilled PT in the acute care venue PT Problem List: Decreased strength;Decreased range of motion;Decreased activity tolerance;Decreased balance;Decreased mobility;Decreased knowledge of use of DME;Pain Barriers to Discharge: None PT Therapy Diagnosis : Difficulty walking;Generalized weakness;Abnormality of gait;Acute pain PT Plan PT Frequency: Min 5X/week PT Treatment/Interventions: DME instruction;Gait training;Functional mobility training;Therapeutic activities;Therapeutic exercise;Patient/family education PT Recommendation Recommendations for Other Services: OT consult Follow Up Recommendations: Home health PT;Skilled nursing facility (Plan is for HHPT at ALF, pending pt progress) Equipment Recommended: None recommended by PT PT Goals  Acute Rehab PT Goals PT Goal Formulation: With patient/family Time For Goal Achievement: 7 days Pt will go Supine/Side to Sit: with min assist PT Goal: Supine/Side to Sit - Progress: Goal set today Pt will  go Sit to Supine/Side: with min assist PT Goal: Sit to Supine/Side - Progress: Goal set today Pt will go Sit to Stand: with supervision PT Goal: Sit to Stand - Progress: Goal set today Pt will go Stand to Sit: with supervision PT Goal: Stand to Sit - Progress: Goal set today Pt will Ambulate: 51 - 150 feet;with supervision;with least restrictive assistive device PT Goal: Ambulate - Progress: Goal set today  PT Evaluation Precautions/Restrictions  Restrictions Weight Bearing Restrictions: Yes RLE Weight Bearing: Touchdown weight bearing Prior Functioning  Home Living Lives With:  (Just moved to ALF) Receives Help From: Personal care attendant Type of Home: Assisted living Home Layout: One level Home Access: Level entry Home Adaptive Equipment: Walker - rolling Prior Function Level of Independence: Independent with basic ADLs;Independent with gait;Independent with transfers Driving: No Vocation: Retired Producer, television/film/video: Awake/alert Overall Cognitive Status: Appears within functional limits for tasks assessed Orientation Level: Oriented X4 Sensation/Coordination Sensation Light Touch: Appears Intact Coordination Gross Motor Movements are Fluid and Coordinated: Yes Extremity Assessment RLE Assessment RLE Assessment: Exceptions to [facility] RLE Strength RLE Overall Strength Comments: ankle motions WFL, unable to test other motions due to NWB status.  LLE Strength LLE Overall Strength Comments: Grossly WFL per functional assessment.  Mobility (including Balance) Bed Mobility Bed Mobility: Yes Supine to Sit: 1: +2 Total assist;Patient percentage (comment) Supine to Sit Details (indicate cue type and reason): Pt assist 40-50%.  Requires assist with RLE and trunk to get to EOB.  cues for hand placement to self assist trunk.  Sit to Supine: 1: +2 Total assist;Patient percentage (comment) Sit to Supine - Details (indicate cue type and reason): Pt assist 20%.   Provided increased assist due to pt having  low BP, returning to bed for safety/increase BP.   Transfers Transfers: Yes Sit to Stand: 1: +2 Total assist;From elevated surface;With upper extremity assist;From bed Sit to Stand Details (indicate cue type and reason): Pt assist 50%.  Assist for forward weight shift with cues for using LLE, NWB on RLE, and for UE placement on bed.  Stand to Sit: 1: +2 Total assist;Patient percentage (comment);With upper extremity assist;To bed Stand to Sit Details: Pt assist 50%.  Assist for controlled descent with cues for hand placement.   Ambulation/Gait Ambulation/Gait: No (Deferred due to low BP) Stairs: No Wheelchair Mobility Wheelchair Mobility: No    Exercise    End of Session PT - End of Session Activity Tolerance: Patient limited by pain;Other (comment) (Pt limited by low BP. ) Patient left: in bed;with call bell in reach;with family/visitor present Nurse Communication: Mobility status for transfers;Other (comment) (RN aware of pts BP.) General Behavior During Session: [facility] for tasks performed Cognition: [facility] for tasks performed  Page, Meribeth Mattes 03/09/2011, 12:31 PM

## 2011-03-09 NOTE — Consult Note (Signed)
Reason for Consult: hypotension, one episode of O2 desaturation Referring Physician: Pat Patrick MD/G. Chestine Spore, PA Cassandra Glenn is an 78 y.o. female with a PMHx that is pretty benign except for breast cancer with lumpectomy followed by radiation about 8 years ago and macular degeneration. She denies any heart of lung history.    HPI: Cassandra Glenn was admitted to [facility] s/p a fall at home resulting in a Right Femoral Neck Fx. She underwent an ORIF of said fx on 03/09/11 by Dr. Shela Glenn. Cassandra Glenn. Blood loss was not over what is to be expected with this surgery and her Hgb today is 9.5 (was 11.7 preop). Earlier today, she had an episode of dizziness accompanied by diaphoresis and pallor upon getting up today and was assisted back to bed. Also, O2 sats fell into the lower 80s which quickly resolved with O2 per [place] increased to 4L. This has not recurred. Her BP continues to run in the 80s/40s but she is non symptomatic. No CP, SOB, wheezing, cough, fever, chills, syncope, or further diaphoresis. She did receive one NS bolus of 500cc at some point today 03/09/11.   Presently, she feels very well.   Because of the above event with hypotension and desaturation, we are asked by orthopaedics to see and evaluate the patient.   Past Medical History  Diagnosis Date  . Cancer   . Macular degeneration   . HOH (hard of hearing)     Past Surgical History  Procedure Date  . Breast surgery 2005    lumpectomy  . Cataract extraction 2010    bialteral    History reviewed. No pertinent family history.  Social History: She is a widow and her husband died 08-24-10. She has an independent apartment at [facility] and was moving when this fx occurred. She denies hx of tobacco. Has a glass of wine about once a week.   Allergies: No Known Allergies  Medications: I have reviewed her medications at present and verified her home medications.  Results for orders placed during the hospital encounter of 03/07/11 (from the past 48  hour(s))  TYPE AND SCREEN     Status: Normal   Collection Time   03/08/11  3:40 AM      Component Value Range Comment   ABO/RH(D) A POS      Antibody Screen NEG      Sample Expiration 03/11/2011     ABO/RH     Status: Normal   Collection Time   03/08/11  3:40 AM      Component Value Range Comment   ABO/RH(D) A POS     SURGICAL PCR SCREEN     Status: Normal   Collection Time   03/08/11  6:58 AM      Component Value Range Comment   MRSA, PCR NEGATIVE  NEGATIVE     Staphylococcus aureus NEGATIVE  NEGATIVE    PROTIME-INR     Status: Normal   Collection Time   03/08/11  9:30 AM      Component Value Range Comment   Prothrombin Time 14.5  11.6 - 15.2 (seconds)    INR 1.11  0.00 - 1.49    CBC     Status: Abnormal   Collection Time   03/09/11  4:10 AM      Component Value Range Comment   WBC 7.1  4.0 - 10.5 (K/uL)    RBC 3.18 (*) 3.87 - 5.11 (MIL/uL)    Hemoglobin 9.5 (*) 12.0 - 15.0 (g/dL)  HCT 28.9 (*) 36.0 - 46.0 (%)    MCV 90.9  78.0 - 100.0 (fL)    MCH 30.2  26.0 - 34.0 (pg)    MCHC 33.2  30.0 - 36.0 (g/dL)    RDW 16.1  09.6 - 04.5 (%)    Platelets 141 (*) 150 - 400 (K/uL)   BASIC METABOLIC PANEL     Status: Abnormal   Collection Time   03/09/11  4:10 AM      Component Value Range Comment   Sodium 133 (*) 135 - 145 (mEq/L)    Potassium 3.5  3.5 - 5.1 (mEq/L)    Chloride 103  96 - 112 (mEq/L)    CO2 26  19 - 32 (mEq/L)    Glucose, Bld 133 (*) 70 - 99 (mg/dL)    BUN 6  6 - 23 (mg/dL)    Creatinine, Ser 4.09  0.50 - 1.10 (mg/dL)    Calcium 7.8 (*) 8.4 - 10.5 (mg/dL)    GFR calc non Af Amer 77 (*) >90 (mL/min)    GFR calc Af Amer 90 (*) >90 (mL/min)     Dg Hip Operative Right  03/08/2011  *RADIOLOGY REPORT*  Clinical Data: Femoral neck fracture  OPERATIVE RIGHT HIP  Comparison:   the previous day's studythe  Findings: Two intraoperative fluoroscopic spot images document placement of three cannulated pins across the right femoral neck fracture, fragments in near anatomic  alignment.  IMPRESSION:  1.  Internal fixation of right femoral neck fracture.  Original Report Authenticated By: Cassandra Glenn, M.D.   Dg Pelvis Portable  03/08/2011  *RADIOLOGY REPORT*  Clinical Data: Right hip fracture  PORTABLE PELVIS  Comparison:   the previous day's study  Findings: Interval placement of three cannulated screws across the right femoral neck fracture, fragments in near anatomic alignment. No evident complication.  Skin staples noted laterally.  IMPRESSION:  Internal fixation of right femoral neck fracture  Original Report Authenticated By: Cassandra Glenn, M.D.    Review of Systems  Constitutional: Positive for diaphoresis (only with episode). Negative for fever, chills and malaise/fatigue.  HENT: Positive for hearing loss. Negative for congestion and sore throat.   Eyes:       Chronic macular degeneration  Respiratory: Positive for shortness of breath (one episode). Negative for cough, sputum production, wheezing and stridor.   Cardiovascular: Negative for chest pain, palpitations, orthopnea and PND.  Gastrointestinal: Negative for heartburn, nausea, vomiting, abdominal pain and diarrhea.  Genitourinary: Negative for dysuria.  Musculoskeletal: Positive for joint pain (right hip postop) and falls (fell at home prior to admission).  Skin: Positive for rash (to abdomen, already addressed by derm as an outpt).  Neurological: Positive for dizziness (became slightly dizzy when up during episode, not recurrent). Negative for tingling, tremors, sensory change, speech change, focal weakness, loss of consciousness (none at home with fall or here with incident), weakness and headaches.  Endo/Heme/Allergies: Does not bruise/bleed easily. c Psychiatric/Behavioral: Negative for depression, memory loss and substance abuse.   Blood pressure 82/42, pulse 86, temperature 98.2 F (36.8 C), temperature source Oral, resp. rate 16, height 5\' 2"  (1.575 m), weight 57 kg (125 lb  10.6 oz), SpO2 95.00%. Physical Exam  Constitutional: She appears well-developed and well-nourished. No distress.       Appears slightly younger than stated age.NAD  HENT:  Head: Normocephalic and atraumatic.  Mouth/Throat: Oropharynx is clear and moist. No oropharyngeal exudate.  Eyes: Conjunctivae are normal. Pupils are equal, round, and  reactive to light. Right eye exhibits no discharge. Left eye exhibits no discharge. No scleral icterus.  Neck: Normal range of motion. Neck supple. No JVD present. No tracheal deviation present. No thyromegaly present.  Cardiovascular: Normal rate and regular rhythm.  Exam reveals no gallop and no friction rub.   No murmur heard.      No carotid bruits. No LE edema. Pedal pulses 2+.  Respiratory: Effort normal and breath sounds normal. No stridor. No respiratory distress. She has no wheezes. She has no rales.  GI: Soft. Bowel sounds are normal. She exhibits no distension. There is no tenderness.  Musculoskeletal:       ROM decreased to operative leg  Lymphadenopathy:    She has no cervical adenopathy.  Skin: Skin is warm and dry. She is not diaphoretic. No erythema. No pallor.  Psychiatric: She has a normal mood and affect. Her behavior is normal. Judgment and thought content normal.    Assessment/Plan: 1. Hypotension-I believe this to be hypovolemia secondary to surgery, decreased po intake, and blood loss. Her episode is symptomatic of orthostatic hypotension. She had no CP and has not had recurrence of episode. We will bolus her with 1000cc 0.9% NS and follow BP. Depending on her intake postop, may want to increase her IVF as she has no hx of CHF or renal failure. Watch Hgb postop. CBC and BMP ordered.  2. Acute hypoxemia-Likely related to getting OOB for the first time postop. She has no hx of cardiac issues nor COPD even though I did find an old CXR that mentioned COPD, but no xray result has mentioned this further and she has no smoking hx. CXR  03/07/11 showed healed old rib fx not present of 2011 CXR. Recheck CXR. Incentive spirometry q 2hours. Given her ortho surgery, may want to check CTA chest to r/o PE, but she has no acute sx of this. Will discuss with Dr. Haroldine Laws and decide. She is on DVT prophylaxis. Will cycle cardiac enzymes and r/p a 12 lead EKG. Continue O2 for now and try to wean as she progresses. I doubt she will need home O2.  3. Postop anemia-she denies a hx of anemia except for a blood transfusion with her first child 60 years ago. Follow CBC. Transfuse as necessary.  Cassandra Glenn, Cassandra Glenn 03/09/2011, 11:52 PM

## 2011-03-09 NOTE — Progress Notes (Addendum)
Subjective: 1 Day Post-Op Procedure(s) (LRB): CANNULATED HIP PINNING (Right) Patient reports pain as 3 on 0-10 scale.   Denies CP or SOB.  Voiding without difficulty. Positive flatus. Objective: Vital signs in last 24 hours: Temp:  [97.4 F (36.3 Glenn)-99.7 F (37.6 Glenn)] 99 F (37.2 Glenn) (02/26 0556) Pulse Rate:  [76-94] 86  (02/26 0556) Resp:  [10-20] 18  (02/26 0743) BP: (90-123)/(44-61) 90/56 mmHg (02/26 0556) SpO2:  [93 %-100 %] 95 % (02/26 0743) FiO2 (%):  [2 %] 2 % (02/25 1817)  Intake/Output from previous day: 02/25 0701 - 02/26 0700 In: 3068.3 [P.O.:480; I.V.:2588.3] Out: 1690 [Urine:1490; Blood:200] Intake/Output this shift:     Basename 03/09/11 0410 03/07/11 2215  HGB 9.5* 11.7*    Basename 03/09/11 0410 03/07/11 2215  WBC 7.1 11.1*  RBC 3.18* 3.95  HCT 28.9* 35.6*  PLT 141* 208    Basename 03/09/11 0410 03/07/11 2216  NA 133* 135  K 3.5 3.4*  CL 103 101  CO2 26 27  BUN 6 10  CREATININE 0.64 0.72  GLUCOSE 133* 158*  CALCIUM 7.8* 8.3*    Basename 03/08/11 0930  LABPT --  INR 1.11    Neurologically intact Sensation intact distally Intact pulses distally Dorsiflexion/Plantar flexion intact No cellulitis present Compartment soft  Assessment/Plan: 1 Day Post-Op Procedure(s) (LRB): CANNULATED HIP PINNING (Right) Advance diet Up with therapy if possible. Will be challengeBEANE,Cassandra Glenn Dressing change right elbow xeroform. 03/09/2011, 7:55 AM

## 2011-03-09 NOTE — Progress Notes (Signed)
Physical Therapy Treatment Patient Details Name: Cassandra Glenn MRN: [id number] DOB: 12/26/1920 Today's Date: 03/09/2011  PT Assessment/Plan  PT - Assessment/Plan Comments on Treatment Session: Pt making slow progress from am session.  Continues to have dizziness with upright position with BP 94/57 sitting up and 106/60 standing.  Pts O2 sats dropped 75% on room air while on EOB, reapplied O2 via nasal cannula (4L) and gave cues for pursed lip breathing.  Pt able to return sats to 98%.   PT Plan: Discharge plan remains appropriate PT Frequency: Min 5X/week Recommendations for Other Services: OT consult Follow Up Recommendations: Home health PT (at ALF) Equipment Recommended: Rolling walker with 5" wheels PT Goals  Acute Rehab PT Goals PT Goal Formulation: With patient/family Time For Goal Achievement: 7 days Pt will go Supine/Side to Sit: with min assist PT Goal: Supine/Side to Sit - Progress: Progressing toward goal Pt will go Sit to Supine/Side: with min assist PT Goal: Sit to Supine/Side - Progress: Goal set today Pt will go Sit to Stand: with supervision PT Goal: Sit to Stand - Progress: Progressing toward goal Pt will go Stand to Sit: with supervision PT Goal: Stand to Sit - Progress: Progressing toward goal Pt will Ambulate: 51 - 150 feet;with supervision;with least restrictive assistive device PT Goal: Ambulate - Progress: Goal set today  PT Treatment Precautions/Restrictions  Restrictions Weight Bearing Restrictions: Yes RLE Weight Bearing: Touchdown weight bearing Mobility (including Balance) Bed Mobility Bed Mobility: Yes Supine to Sit: 1: +2 Total assist;Patient percentage (comment) Supine to Sit Details (indicate cue type and reason): Pt assist 60%.  Requires assist for RLE off of bed and cues for hand placement.  Pt demos better technique during pm session.  Sit to Supine: 1: +2 Total assist;Patient percentage (comment) Sit to Supine - Details (indicate cue type  and reason): Pt assist 20%.  Provided increased assist due to pt having low BP, returning to bed for safety/increase BP.   Transfers Transfers: Yes Sit to Stand: 1: +2 Total assist;From elevated surface;With upper extremity assist;From bed Sit to Stand Details (indicate cue type and reason): Pt assist 50-60%.  Assist for forward weight shift with cues for hand placement and use of LLE to maintain RLE WB precautions.  Stand to Sit: 1: +2 Total assist;Patient percentage (comment);With upper extremity assist;With armrests;To chair/3-in-1 Stand to Sit Details: Pt assist 60%.  Assist for controlled descent with cues for UE placement on arm rest.  Stand Pivot Transfers: 1: +2 Total assist;Patient percentage (comment) Stand Pivot Transfer Details (indicate cue type and reason): Pt assist 50%.  Cues for sequencing/technique with RW, cues for maintaining WB precuations and to use toe touch for balance only.   Ambulation/Gait Ambulation/Gait: No Stairs: No Wheelchair Mobility Wheelchair Mobility: No    Exercise    End of Session PT - End of Session Equipment Utilized During Treatment: Gait belt Activity Tolerance: Patient limited by pain;Other (comment) (Pt continues to be limited by dizziness) Patient left: in chair;with call bell in reach;with family/visitor present Nurse Communication: Mobility status for transfers;Other (comment) General Behavior During Session: [facility] for tasks performed Cognition: [facility] for tasks performed  Page, Meribeth Mattes 03/09/2011, 4:19 PM

## 2011-03-10 ENCOUNTER — Inpatient Hospital Stay (HOSPITAL_COMMUNITY): Payer: Medicare Other

## 2011-03-10 ENCOUNTER — Other Ambulatory Visit: Payer: Self-pay

## 2011-03-10 DIAGNOSIS — D649 Anemia, unspecified: Secondary | ICD-10-CM | POA: Diagnosis not present

## 2011-03-10 DIAGNOSIS — J90 Pleural effusion, not elsewhere classified: Secondary | ICD-10-CM | POA: Diagnosis not present

## 2011-03-10 DIAGNOSIS — J9819 Other pulmonary collapse: Secondary | ICD-10-CM | POA: Diagnosis not present

## 2011-03-10 DIAGNOSIS — D500 Iron deficiency anemia secondary to blood loss (chronic): Secondary | ICD-10-CM | POA: Diagnosis present

## 2011-03-10 DIAGNOSIS — S72009A Fracture of unspecified part of neck of unspecified femur, initial encounter for closed fracture: Secondary | ICD-10-CM | POA: Diagnosis not present

## 2011-03-10 DIAGNOSIS — J984 Other disorders of lung: Secondary | ICD-10-CM | POA: Diagnosis not present

## 2011-03-10 DIAGNOSIS — I959 Hypotension, unspecified: Secondary | ICD-10-CM | POA: Diagnosis not present

## 2011-03-10 DIAGNOSIS — E876 Hypokalemia: Secondary | ICD-10-CM | POA: Diagnosis present

## 2011-03-10 DIAGNOSIS — R918 Other nonspecific abnormal finding of lung field: Secondary | ICD-10-CM | POA: Diagnosis not present

## 2011-03-10 LAB — CBC
Hemoglobin: 9.3 g/dL — ABNORMAL LOW (ref 12.0–15.0)
MCHC: 33.2 g/dL (ref 30.0–36.0)
MCHC: 33.5 g/dL (ref 30.0–36.0)
Platelets: 120 10*3/uL — ABNORMAL LOW (ref 150–400)
RBC: 3.12 MIL/uL — ABNORMAL LOW (ref 3.87–5.11)
RDW: 13.9 % (ref 11.5–15.5)

## 2011-03-10 LAB — BASIC METABOLIC PANEL
BUN: 7 mg/dL (ref 6–23)
BUN: 8 mg/dL (ref 6–23)
Chloride: 104 mEq/L (ref 96–112)
GFR calc Af Amer: 85 mL/min — ABNORMAL LOW (ref >90)
GFR calc Af Amer: 89 mL/min — ABNORMAL LOW (ref >90)
GFR calc non Af Amer: 77 mL/min — ABNORMAL LOW (ref >90)
Potassium: 3.7 mEq/L (ref 3.5–5.1)
Potassium: 3.2 mEq/L — ABNORMAL LOW (ref 3.5–5.1)
Sodium: 130 mEq/L — ABNORMAL LOW (ref 135–145)

## 2011-03-10 LAB — DIFFERENTIAL
Basophils Relative: 0 % (ref 0–1)
Monocytes Relative: 10 % (ref 3–12)
Neutro Abs: 6.1 10*3/uL (ref 1.7–7.7)
Neutrophils Relative %: 74 % (ref 43–77)

## 2011-03-10 LAB — CARDIAC PANEL(CRET KIN+CKTOT+MB+TROPI)
CK, MB: 2.2 ng/mL (ref 0.3–4.0)
Relative Index: 1.2 (ref 0.0–2.5)
Relative Index: 1.7 (ref 0.0–2.5)
Total CK: 189 U/L — ABNORMAL HIGH (ref 7–177)
Troponin I: <0.3 ng/mL (ref <0.30)
Troponin I: <0.3 ng/mL (ref <0.30)

## 2011-03-10 MED ORDER — POTASSIUM CHLORIDE IN NACL 20-0.45 MEQ/L-% IV SOLN
INTRAVENOUS | Status: DC
  Administered 2011-03-10: 12:00:00 via INTRAVENOUS
  Filled 2011-03-10 (×2): qty 1000

## 2011-03-10 MED ORDER — POTASSIUM CHLORIDE CRYS ER 20 MEQ PO TBCR
20.0000 meq | EXTENDED_RELEASE_TABLET | Freq: Every day | ORAL | Status: DC
  Administered 2011-03-10 – 2011-03-11 (×2): 20 meq via ORAL
  Filled 2011-03-10 (×3): qty 1

## 2011-03-10 MED ORDER — SODIUM CHLORIDE 0.9 % IV BOLUS (SEPSIS)
500.0000 mL | Freq: Once | INTRAVENOUS | Status: AC
  Administered 2011-03-10: 500 mL via INTRAVENOUS

## 2011-03-10 NOTE — Progress Notes (Signed)
Subjective: 2 Days Post-Op Procedure(s) (LRB): CANNULATED HIP PINNING (Right) Patient reports pain as mild.  She is currently OOB in chair. She states she is doing much better today. Denies CP or SOB.  No dizziness or nausea is noted.  Positive flatus. Objective: Vital signs in last 24 hours: Temp:  [98.2 F (36.8 C)-98.8 F (37.1 C)] 98.8 F (37.1 C) (02/27 0612) Pulse Rate:  [84-95] 85  (02/27 0612) Resp:  [14-18] 16  (02/27 0612) BP: (70-120)/(34-61) 120/61 mmHg (02/27 0612) SpO2:  [92 %-99 %] 96 % (02/27 0612)  Intake/Output from previous day: 02/26 0701 - 02/27 0700 In: 2036.7 [P.O.:780; I.V.:706.7; IV Piggyback:550] Out: 1000 [Urine:1000] Intake/Output this shift: Total I/O In: 240 [P.O.:240] Out: -    Basename 03/10/11 0410 03/10/11 0015 03/09/11 0410 03/07/11 2215  HGB 8.9* 9.3* 9.5* 11.7*    Basename 03/10/11 0410 03/10/11 0015  WBC 7.2 8.3  RBC 2.99* 3.12*  HCT 26.6* 28.0*  PLT 120* 125*    Basename 03/10/11 0410 03/10/11 0015  NA 130* 132*  K 3.2* 3.7  CL 103 104  CO2 23 24  BUN 8 7  CREATININE 0.66 0.74  GLUCOSE 135* 157*  CALCIUM 7.7* 7.7*    Basename 03/08/11 0930  LABPT --  INR 1.11    Neurologically intact Neurovascular intact Sensation intact distally Dorsiflexion/Plantar flexion intact Incision: dressing C/D/I Compartment soft  Assessment/Plan: 2 Days Post-Op Procedure(s) (LRB): CANNULATED HIP PINNING (Right) Up with therapy Continue foley due to strict I&O Hyponatremia will adjust fluids since output is improving Hypokalemia will supplement Continue to monitor H/H Hypotension improving Bilateral pleural effusions noted on CXR will cont with IS Appreciate IM input into care of pt Disposition SNF when stable medically Daily dressing change Brigida Scotti R. 03/10/2011, 9:51 AM

## 2011-03-10 NOTE — Consult Note (Signed)
Patient seen and evaluated at the nurse practitioner Maren Reamer I agree with her physical examination her assessment and plan. The only changes are only 500 cc of normal saline will be given. Will reevaluate blood pressure then and monitor for fluid overload. Given a single transient episode of hypoxia, recommend continues of incentive spirometer. Repeat chest x-ray in a.m. Check a urinalysis.  Dr. Burnett Harry to assume care in A.m.

## 2011-03-10 NOTE — Progress Notes (Signed)
PROGRESS NOTE  Cassandra Glenn AVW:[id number] DOB: Mar 12, 1920 DOA: 03/07/2011 PCP: No primary provider on file.  Brief narrative: Cassandra Glenn is an 78 year old female admitted by Dr. Jillyn Hidden on 03/07/11 with a right hip fracture secondary to fall.  She underwent operative repair on 03/08/11.  She developed post-operative hypotension and one episode of oxygen desaturation prompting a consultation with internal medicine.    Procedures:  Cannulated hip pinning of right femur on 03/08/11 by Dr. Jillyn Hidden  Antibiotics:  Ancef 03/09/11---> 03/09/11   Subjective  Cassandra Glenn feels good.  She denies dyspnea.  Occasional cough.  No nausea or vomiting.   Objective    Interim History: Stable over night.   Objective: Filed Vitals:   03/10/11 0000 03/10/11 0145 03/10/11 0400 03/10/11 0612  BP:  112/58  120/61  Pulse:  93  85  Temp:    98.8 F (37.1 C)  TempSrc:    Oral  Resp: 14 14 14 16   Height:      Weight:      SpO2: 94% 92% 94% 96%    Intake/Output Summary (Last 24 hours) at 03/10/11 1236 Last data filed at 03/10/11 1137  Gross per 24 hour  Intake 1296.67 ml  Output   1500 ml  Net -203.33 ml    Exam: Gen:  NAD Cardiovascular:  RRR, No M/R/G Respiratory: Lungs CTAB Gastrointestinal: Abdomen soft, NT/ND with normal active bowel sounds. Extremities: No C/E/C    Data Reviewed: Basic Metabolic Panel:  Lab 03/10/11 8295 03/10/11 0015 03/09/11 0410 03/07/11 2216  NA 130* 132* 133* 135  K 3.2* 3.7 -- --  CL 103 104 103 101  CO2 23 24 26 27   GLUCOSE 135* 157* 133* 158*  BUN 8 7 6 10   CREATININE 0.66 0.74 0.64 0.72  CALCIUM 7.7* 7.7* 7.8* 8.3*  MG -- -- -- --  PHOS -- -- -- --   CBC:  Lab 03/10/11 0410 03/10/11 0015 03/09/11 0410 03/07/11 2215  WBC 7.2 8.3 7.1 11.1*  NEUTROABS -- 6.1 -- --  HGB 8.9* 9.3* 9.5* 11.7*  HCT 26.6* 28.0* 28.9* 35.6*  MCV 89.0 89.7 90.9 90.1  PLT 120* 125* 141* 208   Cardiac Enzymes:  Lab 03/10/11 0815 03/10/11 0015  CKTOTAL 161 189*  CKMB  2.4 2.2  CKMBINDEX -- --  TROPONINI <0.30 <0.30     Recent Results (from the past 240 hour(s))  URINE CULTURE     Status: Normal   Collection Time   03/07/11 11:02 PM      Component Value Range Status Comment   Specimen Description URINE, CATHETERIZED   Final    Special Requests NONE   Final    Culture  Setup Time [id number]   Final    Colony Count NO GROWTH   Final    Culture NO GROWTH   Final    Report Status 03/09/2011 FINAL   Final   SURGICAL PCR SCREEN     Status: Normal   Collection Time   03/08/11  6:58 AM      Component Value Range Status Comment   MRSA, PCR NEGATIVE  NEGATIVE  Final    Staphylococcus aureus NEGATIVE  NEGATIVE  Final      Studies:  Dg Chest 2 View 03/07/2011 IMPRESSION:  1.  Chronic lung changes again noted; no acute cardiopulmonary process seen.  No acute displaced rib fractures identified. 2.  Apparent healed right-sided rib fractures, new from 2011.  Original Report Authenticated By: Tonia Ghent, M.D.  Dg Elbow Complete Right 03/07/2011  IMPRESSION:  1.  Bony demineralization.  No acute bony findings.  Original Report Authenticated By: Dellia Cloud, M.D.    Dg Hip Complete Right 03/07/2011 IMPRESSION:  1.  Acute right femoral neck fracture. 2.  Rotary scoliosis in the lower lumbar spine.  Original Report Authenticated By: Dellia Cloud, M.D.    Dg Hip Operative Right 03/08/2011   IMPRESSION:  1.  Internal fixation of right femoral neck fracture.  Original Report Authenticated By: Osa Craver, M.D.    Dg Pelvis Portable 03/08/2011   IMPRESSION:  Internal fixation of right femoral neck fracture  Original Report Authenticated By: Osa Craver, M.D.    Dg Chest Port 1 View 03/10/2011   IMPRESSION:  1.  No significant change. 2.  Bilateral pleural effusions and interstitial edema.  Original Report Authenticated By: Genevive Bi, M.D.    Dg Chest Port 1 View 03/10/2011 IMPRESSION: New small bilateral pleural  effusions, with bibasilar and bilateral central airspace opacification, likely reflecting pulmonary edema, right greater than left.  These results were called by telephone on 03/10/2011  at  02:58 a.m. to  [facility] on [address], who verbally acknowledged these results.  Original Report Authenticated By: Tonia Ghent, M.D.    Scheduled Meds:   . sodium chloride   Intravenous Once  . aspirin  325 mg Oral BID  .  ceFAZolin (ANCEF) IV  1 g Intravenous Q6H  . cholecalciferol  400 Units Oral Daily  . docusate sodium  100 mg Oral BID  . potassium chloride  20 mEq Oral Daily  . sodium chloride  500 mL Intravenous Once  . sodium chloride  500 mL Intravenous Once   Continuous Infusions:   . 0.45 % NaCl with KCl 20 mEq / L 75 mL/hr at 03/10/11 1209  . DISCONTD: sodium chloride 100 mL/hr at 03/10/11 4540     Assessment/Plan: Active Problems:  Hip fracture Status post operative repair by Dr. Jillyn Hidden.  PT/OT.  Disposition per ortho.  Hypotension Resolved with IVF bolus.  Likely medication side effect.  Hypoxemia Likely from hypoventilation in the post operative setting.  Incentive spirometry Q 1 hour while awake.  No further episodes.  Would d/c IVF given ? Pulmonary edema on CXR.  Hypokalemia Supplementation therapy ordered by ortho.  Normocytic anemia due to blood loss Per ortho.  *Will sign off, no further recommendations.  Please page me for questions/concerns.   LOS: 3 days   Hillery Aldo, MD Pager [phone]  03/10/2011, 12:36 PM

## 2011-03-10 NOTE — Progress Notes (Signed)
Physical Therapy Treatment Patient Details Name: Cassandra Glenn MRN: [id number] DOB: 09/27/1920 Today's Date: 03/10/2011  PT Assessment/Plan  PT - Assessment/Plan Comments on Treatment Session: Pt did much better today with increased ambulation.  Pt's BP remained stable throughout session with BP 126/71 with O2 sats at 87% upon returning to chair on 2L O2 via nasal cannula with cues for pursed lip breathing, able to return sats to 92%.   PT Plan: Discharge plan remains appropriate PT Frequency: Min 5X/week Follow Up Recommendations: Skilled nursing facility Equipment Recommended: Defer to next venue PT Goals  Acute Rehab PT Goals PT Goal Formulation: With patient/family Time For Goal Achievement: 7 days Pt will go Supine/Side to Sit: with min assist PT Goal: Supine/Side to Sit - Progress: Progressing toward goal Pt will go Sit to Stand: with supervision PT Goal: Sit to Stand - Progress: Progressing toward goal Pt will go Stand to Sit: with supervision PT Goal: Stand to Sit - Progress: Progressing toward goal Pt will Ambulate: 51 - 150 feet;with supervision;with least restrictive assistive device PT Goal: Ambulate - Progress: Progressing toward goal  PT Treatment Precautions/Restrictions  Restrictions Weight Bearing Restrictions: Yes RLE Weight Bearing: Touchdown weight bearing Mobility (including Balance) Bed Mobility Bed Mobility: Yes Supine to Sit: 1: +2 Total assist;Patient percentage (comment) Supine to Sit Details (indicate cue type and reason): Pt assist 60%.  Requires assist for RLE off of bed and some assist for trunk/hips.  Transfers Transfers: Yes Sit to Stand: 1: +2 Total assist;From elevated surface;With upper extremity assist;From bed Sit to Stand Details (indicate cue type and reason): Pt assist 60% with cues for hand placement and maintaining WB status.   Stand to Sit: 1: +2 Total assist;Patient percentage (comment);With upper extremity assist;With armrests;To  chair/3-in-1 Stand to Sit Details: Pt assist 70%.  Assist for controlled descent with cues for hand placement.  Ambulation/Gait Ambulation/Gait: Yes Ambulation/Gait Assistance: 1: +2 Total assist;Patient percentage (comment) Ambulation/Gait Assistance Details (indicate cue type and reason): Pt assist 50-60%.  cues for TTWB for balance and upright posture.   Ambulation Distance (Feet): 15 Feet Assistive device: Rolling walker Gait Pattern: Step-to pattern;Trunk flexed Gait velocity: decreased    Exercise    End of Session PT - End of Session Equipment Utilized During Treatment: Gait belt Activity Tolerance: Patient limited by pain Patient left: in chair;with call bell in reach;with family/visitor present Nurse Communication: Mobility status for transfers;Other (comment) General Behavior During Session: [facility] for tasks performed Cognition: [facility] for tasks performed  Page, Meribeth Mattes 03/10/2011, 9:30 AM

## 2011-03-10 NOTE — Evaluation (Signed)
Occupational Therapy Evaluation Patient Details Name: Cassandra Glenn MRN: [id number] DOB: 02/01/1920 Today's Date: 03/10/2011  Problem List:  Patient Active Problem List  Diagnoses  . Hip fracture  . Hypotension  . Hypoxemia    Past Medical History:  Past Medical History  Diagnosis Date  . Cancer   . Macular degeneration   . HOH (hard of hearing)    Past Surgical History:  Past Surgical History  Procedure Date  . Breast surgery 2005    lumpectomy  . Cataract extraction 2010    bialteral    OT Assessment/Plan/Recommendation OT Assessment Clinical Impression Statement: Pt is an 78 yo female who presents with a R hip fx. Skilled OT recommended to maximize I w/BADLs in prep for d/c to next venue of care. OT Recommendation/Assessment: Patient will need skilled OT in the acute care venue OT Problem List: Decreased activity tolerance;Decreased safety awareness;Decreased knowledge of use of DME or AE Barriers to Discharge: Inaccessible home environment;Decreased caregiver support OT Therapy Diagnosis : Generalized weakness OT Plan OT Frequency: Min 1X/week OT Treatment/Interventions: Self-care/ADL training;Therapeutic activities;Patient/family education;DME and/or AE instruction OT Recommendation Follow Up Recommendations: Skilled nursing facility Equipment Recommended: Defer to next venue Individuals Consulted Consulted and Agree with Results and Recommendations: Patient OT Goals Acute Rehab OT Goals OT Goal Formulation: With patient Time For Goal Achievement: 2 weeks ADL Goals Pt Will Perform Grooming: with supervision;Standing at sink (X 1 task) ADL Goal: Grooming - Progress: Goal set today Pt Will Transfer to Toilet: with min assist;3-in-1;Ambulation;Stand pivot transfer ADL Goal: Toilet Transfer - Progress: Goal set today Pt Will Perform Toileting - Clothing Manipulation: with min assist;Standing ADL Goal: Toileting - Clothing Manipulation - Progress: Goal set  today Pt Will Perform Toileting - Hygiene: with min assist;Sit to stand from 3-in-1/toilet ADL Goal: Toileting - Hygiene - Progress: Goal set today  OT Evaluation Precautions/Restrictions  Restrictions Weight Bearing Restrictions: Yes RLE Weight Bearing: Touchdown weight bearing Prior Functioning Home Living Lives With: Other (Comment) (Just moved to ALF) Receives Help From: Personal care attendant Type of Home: Assisted living Home Layout: One level Home Access: Level entry Home Adaptive Equipment: Walker - rolling Prior Function Level of Independence: Independent with basic ADLs;Independent with transfers;Independent with gait Driving: No Vocation: Retired ADL ADL Grooming: Simulated;Set up Where Assessed - Grooming: Sitting, bed;Unsupported Upper Body Bathing: Simulated;Minimal assistance Where Assessed - Upper Body Bathing: Sitting, bed;Unsupported Lower Body Bathing: Simulated;Maximal assistance Where Assessed - Lower Body Bathing: Sit to stand from bed Upper Body Dressing: Simulated;Minimal assistance Where Assessed - Upper Body Dressing: Sitting, bed;Unsupported Lower Body Dressing: Simulated;Maximal assistance Where Assessed - Lower Body Dressing: Sit to stand from bed Toilet Transfer: Simulated;Minimal assistance Toilet Transfer Method: Stand pivot Toileting - Clothing Manipulation: Simulated;Maximal assistance Where Assessed - Glass blower/designer Manipulation: Standing Toileting - Hygiene: Simulated;Maximal assistance Where Assessed - Toileting Hygiene: Standing Tub/Shower Transfer: Not assessed Tub/Shower Transfer Method: Not assessed Equipment Used: Rolling walker Ambulation Related to ADLs: Pt fatigued quickly after ambulation, O2 saturation decreased to 87% on 2L of O2. Increased to 92% with rest and PLB. Vision/Perception    Cognition Cognition Arousal/Alertness: Awake/alert Overall Cognitive Status: Appears within functional limits for tasks  assessed Orientation Level: Oriented X4 Sensation/Coordination   Extremity Assessment RUE Assessment RUE Assessment: Within Functional Limits LUE Assessment LUE Assessment: Within Functional Limits Mobility  Bed Mobility Bed Mobility: Yes Supine to Sit: 1: +2 Total assist;Patient percentage (comment) Supine to Sit Details (indicate cue type and reason): Pt assist 60%.  Requires assist for  RLE off of bed and some assist for trunk/hips.  Transfers Sit to Stand: 1: +2 Total assist;From elevated surface;With upper extremity assist;From bed Sit to Stand Details (indicate cue type and reason): Pt assist 60% with cues for hand placement and maintaining WB status.   Stand to Sit: 1: +2 Total assist;Patient percentage (comment);With upper extremity assist;With armrests;To chair/3-in-1 Stand to Sit Details: Pt assist 70%.  Assist for controlled descent with cues for hand placement.  Exercises   End of Session OT - End of Session Equipment Utilized During Treatment: Gait belt Activity Tolerance: Patient tolerated treatment well Patient left: in chair;with call bell in reach General Behavior During Session: [facility] for tasks performed Cognition: [facility] for tasks performed   Dontaye Hur A, OTR/L [phone] 03/10/2011, 10:03 AM

## 2011-03-10 NOTE — Progress Notes (Signed)
CSW assisting with d/c planning. [facility] does not have SNF availability at this time. Pt/family have accepted ST SNF placement at [facility] when stable for d/c. CSW will continue to assist with d/c planning to SNF.

## 2011-03-11 ENCOUNTER — Encounter (HOSPITAL_COMMUNITY): Payer: Self-pay | Admitting: Specialist

## 2011-03-11 DIAGNOSIS — M25559 Pain in unspecified hip: Secondary | ICD-10-CM | POA: Diagnosis not present

## 2011-03-11 DIAGNOSIS — R0602 Shortness of breath: Secondary | ICD-10-CM | POA: Diagnosis not present

## 2011-03-11 DIAGNOSIS — I959 Hypotension, unspecified: Secondary | ICD-10-CM | POA: Diagnosis not present

## 2011-03-11 DIAGNOSIS — Z9181 History of falling: Secondary | ICD-10-CM | POA: Diagnosis not present

## 2011-03-11 DIAGNOSIS — S72009A Fracture of unspecified part of neck of unspecified femur, initial encounter for closed fracture: Secondary | ICD-10-CM | POA: Diagnosis not present

## 2011-03-11 DIAGNOSIS — Z5189 Encounter for other specified aftercare: Secondary | ICD-10-CM | POA: Diagnosis not present

## 2011-03-11 DIAGNOSIS — S72009D Fracture of unspecified part of neck of unspecified femur, subsequent encounter for closed fracture with routine healing: Secondary | ICD-10-CM | POA: Diagnosis not present

## 2011-03-11 DIAGNOSIS — I2510 Atherosclerotic heart disease of native coronary artery without angina pectoris: Secondary | ICD-10-CM | POA: Diagnosis not present

## 2011-03-11 DIAGNOSIS — G4700 Insomnia, unspecified: Secondary | ICD-10-CM | POA: Diagnosis not present

## 2011-03-11 DIAGNOSIS — E876 Hypokalemia: Secondary | ICD-10-CM | POA: Diagnosis not present

## 2011-03-11 DIAGNOSIS — S72043A Displaced fracture of base of neck of unspecified femur, initial encounter for closed fracture: Secondary | ICD-10-CM | POA: Diagnosis not present

## 2011-03-11 DIAGNOSIS — D62 Acute posthemorrhagic anemia: Secondary | ICD-10-CM | POA: Diagnosis not present

## 2011-03-11 DIAGNOSIS — K5900 Constipation, unspecified: Secondary | ICD-10-CM | POA: Diagnosis not present

## 2011-03-11 LAB — CBC
HCT: 29.7 % — ABNORMAL LOW (ref 36.0–46.0)
Hemoglobin: 9.9 g/dL — ABNORMAL LOW (ref 12.0–15.0)
RBC: 3.34 MIL/uL — ABNORMAL LOW (ref 3.87–5.11)
WBC: 7.9 10*3/uL (ref 4.0–10.5)

## 2011-03-11 LAB — BASIC METABOLIC PANEL
BUN: 7 mg/dL (ref 6–23)
Chloride: 105 mEq/L (ref 96–112)
Glucose, Bld: 106 mg/dL — ABNORMAL HIGH (ref 70–99)
Potassium: 3.5 mEq/L (ref 3.5–5.1)

## 2011-03-11 MED ORDER — ASPIRIN 325 MG PO TABS: 325.0000 mg | ORAL_TABLET | Freq: Two times a day (BID) | ORAL | Status: AC

## 2011-03-11 MED ORDER — HYDROCODONE-ACETAMINOPHEN 5-325 MG PO TABS: 1.0000 | ORAL_TABLET | ORAL | Status: AC | PRN

## 2011-03-11 MED ORDER — METHOCARBAMOL 500 MG PO TABS: 500.0000 mg | ORAL_TABLET | Freq: Four times a day (QID) | ORAL | Status: AC | PRN

## 2011-03-11 MED ORDER — DSS 100 MG PO CAPS: 100.0000 mg | ORAL_CAPSULE | Freq: Two times a day (BID) | ORAL | Status: AC

## 2011-03-11 NOTE — Progress Notes (Signed)
Pt to be d/c to [facility] via [facility] for [facility] SNF placement.

## 2011-03-11 NOTE — Progress Notes (Signed)
Subjective: 3 Days Post-Op Procedure(s) (LRB): CANNULATED HIP PINNING (Right) Patient reports pain as mild.   Denies CP or SOB.  Positive flatus.  Much improved today. Objective: Vital signs in last 24 hours: Temp:  [97.6 F (36.4 C)-99.8 F (37.7 C)] 98.4 F (36.9 C) (02/28 0646) Pulse Rate:  [84-100] 85  (02/28 0646) Resp:  [16-18] 16  (02/28 0646) BP: (98-151)/(54-81) 128/73 mmHg (02/28 0646) SpO2:  [92 %-98 %] 94 % (02/28 0646)  Intake/Output from previous day: 02/27 0701 - 02/28 0700 In: 1938.8 [P.O.:1200; I.V.:738.8] Out: 1800 [Urine:1800] Intake/Output this shift: Total I/O In: 200 [P.O.:200] Out: -    Basename 03/11/11 0423 03/10/11 0410 03/10/11 0015 03/09/11 0410  HGB 9.9* 8.9* 9.3* 9.5*    Basename 03/11/11 0423 03/10/11 0410  WBC 7.9 7.2  RBC 3.34* 2.99*  HCT 29.7* 26.6*  PLT 156 120*    Basename 03/11/11 0423 03/10/11 0410  NA 136 130*  K 3.5 3.2*  CL 105 103  CO2 25 23  BUN 7 8  CREATININE 0.58 0.66  GLUCOSE 106* 135*  CALCIUM 8.1* 7.7*   No results found for this basename: LABPT:2,INR:2 in the last 72 hours  Neurologically intact Neurovascular intact Sensation intact distally Dorsiflexion/Plantar flexion intact Incision: no drainage Compartment soft  Assessment/Plan: 3 Days Post-Op Procedure(s) (LRB): CANNULATED HIP PINNING (Right) Discharge to SNF D/c IV, foley Stable for d/c to [facility] when bed aval  Torres Hardenbrook R. 03/11/2011, 9:58 AM

## 2011-03-11 NOTE — Discharge Summary (Signed)
Patient ID: Cassandra Glenn MRN: [id number] DOB/AGE: 76-Feb-1922 76 y.o.  Admit date: 03/07/2011 Discharge date: 03/11/2011  Admission Diagnoses:  Active Problems:  Hip fracture  Hypotension  Hypoxemia  Hypokalemia  Normocytic anemia due to blood loss  Past Medical History  Diagnosis Date  . Cancer   . Macular degeneration   . HOH (hard of hearing)    Discharge Diagnoses:  Same S/p ORIF hip Stable post op anemia Resolved hypokalemia/hyponatremia   Surgeries: Procedure(s): CANNULATED HIP PINNING on 03/07/2011 - 03/08/2011   Consultants:  [facility]  Discharged Condition: Improved  Hospital Course: Cassandra Glenn is an 78 y.o. female who was admitted 03/07/2011 for operative treatment of hip fracture. Patient has severe unremitting pain that affects sleep, daily activities, and work/hobbies. After pre-op clearance the patient was taken to the operating room on 03/07/2011 - 03/08/2011 and underwent  Procedure(s): CANNULATED HIP PINNING.    Patient was given perioperative antibiotics: Anti-infectives     Start     Dose/Rate Route Frequency Ordered Stop   03/09/11 0000   ceFAZolin (ANCEF) IVPB 1 g/50 mL premix        1 g 100 mL/hr over 30 Minutes Intravenous Every 6 hours 03/08/11 1852 03/09/11 1357           Patient was given sequential compression devices, early ambulation, and chemoprophylaxis to prevent DVT.  Patient benefited maximally from hospital stay and there were no complications.  She did have episode of hypoxia and hypotension but this resolved with gentle hydration.  She also has post op anemia but at time of discharge this was also improving.     Recent vital signs: Patient Vitals for the past 24 hrs:  BP Temp Temp src Pulse Resp SpO2  03/11/11 0646 128/73 mmHg 98.4 F (36.9 C) Oral 85  16  94 %  03/11/11 0442 - - - - 16  95 %  03/11/11 0014 - - - - 17  95 %  March 25, 2011 2127 147/68 mmHg 99.8 F (37.7 C) Oral 95  16  94 %  03/25/11 1953 - - - - 18   95 %  Mar 25, 2011 1846 151/81 mmHg 98.9 F (37.2 C) - 100  18  92 %  03-25-2011 1600 - - - - 18  95 %  03-25-2011 1441 98/54 mmHg 97.6 F (36.4 C) Oral 84  16  97 %  25-Mar-2011 1200 - - - - 16  98 %     Recent laboratory studies:  Basename 03/11/11 0423 Mar 25, 2011 0410  WBC 7.9 7.2  HGB 9.9* 8.9*  HCT 29.7* 26.6*  PLT 156 120*  NA 136 130*  K 3.5 3.2*  CL 105 103  CO2 25 23  BUN 7 8  CREATININE 0.58 0.66  GLUCOSE 106* 135*  INR -- --  CALCIUM 8.1* --     Discharge Medications:   Medication List  As of 03/11/2011 10:04 AM   STOP taking these medications         aspirin EC 81 MG tablet      glucosamine-chondroitin 500-400 MG tablet         TAKE these medications         aspirin 325 MG tablet   Take 1 tablet (325 mg total) by mouth 2 (two) times daily.      Bilberry 100 MG Caps   Take 1 capsule by mouth daily.      cholecalciferol 400 UNITS Tabs   Commonly known as: VITAMIN D  Take 400 Units by mouth daily.      DSS 100 MG Caps   Take 100 mg by mouth 2 (two) times daily.      HYDROcodone-acetaminophen 5-325 MG per tablet   Commonly known as: NORCO   Take 1-2 tablets by mouth every 4 (four) hours as needed.      methocarbamol 500 MG tablet   Commonly known as: ROBAXIN   Take 1 tablet (500 mg total) by mouth every 6 (six) hours as needed.      PRESERVISION AREDS 2 Caps   Take 2 capsules by mouth daily.      RESTORA Caps   Take 1 capsule by mouth daily.      zolpidem 5 MG tablet   Commonly known as: AMBIEN   Take 5 mg by mouth at bedtime as needed. sleep            Diagnostic Studies: Dg Chest 2 View  03/07/2011  *RADIOLOGY REPORT*  Clinical Data: Status post fall; concern for chest injury.  CHEST - 2 VIEW  Comparison: Chest radiograph performed 05/29/2009  Findings: The lungs are well-aerated.  Chronically increased lung markings are noted, with mild underlying scarring seen.  This is particularly prominent at the right lung apex.  Bilateral lung densities  appear unchanged from 2011.  There is no evidence of pleural effusion or pneumothorax.  The heart is normal in size; the mediastinal contour is within normal limits.  No acute osseous abnormalities are seen.  Apparent healed right-sided rib fractures are noted, new from 2011.  Clips are noted at the right axilla.  IMPRESSION:  1.  Chronic lung changes again noted; no acute cardiopulmonary process seen.  No acute displaced rib fractures identified. 2.  Apparent healed right-sided rib fractures, new from 2011.  Original Report Authenticated By: Tonia Ghent, M.D.   Dg Elbow Complete Right  03/07/2011  *RADIOLOGY REPORT*  Clinical Data: Fall with right elbow laceration.  RIGHT ELBOW - COMPLETE 3+ VIEW  Comparison: None.  Findings: Bony demineralization noted. No fracture, foreign body, or acute bony findings are identified.  The anterior humeral line intersects the middle third of the capitellum.  No elbow effusion noted.  IMPRESSION:  1.  Bony demineralization.  No acute bony findings.  Original Report Authenticated By: Dellia Cloud, M.D.   Dg Hip Complete Right  03/07/2011  *RADIOLOGY REPORT*  Clinical Data: Fall.  Right hip pain.  RIGHT HIP - COMPLETE 2+ VIEW  Comparison: 11/19/2004  Findings: Lower lumbar rotary scoliosis noted.  There is diffuse bony demineralization.  There is spurring in the right femoral neck region in addition to apparent cortical discontinuity compatible with acute right femoral neck fracture.  No fracture of the bony pelvis is observed.  IMPRESSION:  1.  Acute right femoral neck fracture. 2.  Rotary scoliosis in the lower lumbar spine.  Original Report Authenticated By: Dellia Cloud, M.D.   Dg Hip Operative Right  03/08/2011  *RADIOLOGY REPORT*  Clinical Data: Femoral neck fracture  OPERATIVE RIGHT HIP  Comparison:   the previous day's studythe  Findings: Two intraoperative fluoroscopic spot images document placement of three cannulated pins across the right femoral  neck fracture, fragments in near anatomic alignment.  IMPRESSION:  1.  Internal fixation of right femoral neck fracture.  Original Report Authenticated By: Osa Craver, M.D.   Dg Pelvis Portable  03/08/2011  *RADIOLOGY REPORT*  Clinical Data: Right hip fracture  PORTABLE PELVIS  Comparison:   the previous  day's study  Findings: Interval placement of three cannulated screws across the right femoral neck fracture, fragments in near anatomic alignment. No evident complication.  Skin staples noted laterally.  IMPRESSION:  Internal fixation of right femoral neck fracture  Original Report Authenticated By: Osa Craver, M.D.   Dg Chest Port 1 View  03/10/2011  *RADIOLOGY REPORT*  Clinical Data: Evaluate fluid over  PORTABLE CHEST - 1 VIEW  Comparison: No chest radiograph 02/27 1013  Findings: Stable cardiac silhouette.  Bibasilar atelectasis and effusions are unchanged. Interstitial edema pattern versus mild pulmonary edema pattern is not significantly improved.  No pneumothorax.  IMPRESSION:  1.  No significant change. 2.  Bilateral pleural effusions and interstitial edema.  Original Report Authenticated By: Genevive Bi, M.D.   Dg Chest Port 1 View  03/10/2011  *RADIOLOGY REPORT*  Clinical Data: Shortness of breath; hypoxia.  PORTABLE CHEST - 1 VIEW  Comparison: Chest radiograph performed 03/07/2011  Findings: The lungs remain relatively well expanded.  New small bilateral pleural effusions are noted, with bibasilar and bilateral central airspace opacification, likely reflecting pulmonary edema. Biapical scarring is more prominent, likely reflecting associated pleural fluid.  No definite pneumothorax is seen.  A linear lucency along the lateral right hemithorax appears to reflect a skin fold, as right apical scarring remains unaffected.  The cardiomediastinal silhouette is borderline normal in size; calcification is noted within the aortic arch.  Vascular congestion is noted.  No acute  osseous abnormalities are seen.  Clips are seen overlying the right axilla.  IMPRESSION: New small bilateral pleural effusions, with bibasilar and bilateral central airspace opacification, likely reflecting pulmonary edema, right greater than left.  These results were called by telephone on 03/10/2011  at  02:58 a.m. to  [facility] on [address], who verbally acknowledged these results.  Original Report Authenticated By: Tonia Ghent, M.D.    Disposition: 01-Home or Self Care  Discharge Orders    Future Orders Please Complete By Expires   Diet - low sodium heart healthy      Call MD / Call 911      Comments:   If you experience chest pain or shortness of breath, CALL 911 and be transported to the hospital emergency room.  If you develope a fever above 101 F, pus (white drainage) or increased drainage or redness at the wound, or calf pain, call your surgeon's office.   Constipation Prevention      Comments:   Drink plenty of fluids.  Prune juice may be helpful.  You may use a stool softener, such as Colace (over the counter) 100 mg twice a day.  Use MiraLax (over the counter) for constipation as needed.   Increase activity slowly as tolerated      Weight Bearing as taught in Physical Therapy      Comments:   Use a walker or crutches as instructed.   Discharge instructions      Comments:   Daily dressing change to right hip Elevate several times a day TTWB to right LE Fall precautions      Follow-up Information    Follow up with BEANE,JEFFREY C, MD in 2 weeks.   Contact information:   [facility] [address], [address] [place] [address] [phone]        Patient will need Aspirin 325mg  bid for DVT prevention.  She will need to continue with her incentive spirometer to prevent pneumonia.     Signed: Liesl Simons R. 03/11/2011,  10:04 AM

## 2011-03-11 NOTE — Progress Notes (Signed)
Physical Therapy Treatment Patient Details Name: Cassandra Glenn MRN: [id number] DOB: 1920-01-18 Today's Date: 03/11/2011  PT Assessment/Plan  PT - Assessment/Plan Comments on Treatment Session: Pt continues to progress with ambulation and doing well with WB status.  Pt continues to desat with activity.  When pt got to EOB, pts O2 sats were 70% on RA.  Applied 2LO2 via nasal cannula with cues for pursed lip breathing, sats increased to 92%.  Maintained 2LO2 while ambulating with O2 at 88% when returned to chair.  Continued to provide cues for pursed lip breathing, O2 sats increased to 96% and remained in 90's on room air in chair.  Pt to D/C today to [facility].  PT Plan: Discharge plan remains appropriate PT Frequency: Min 5X/week Follow Up Recommendations: Skilled nursing facility Equipment Recommended: Defer to next venue PT Goals  Acute Rehab PT Goals PT Goal Formulation: With patient/family Time For Goal Achievement: 7 days Pt will go Supine/Side to Sit: with min assist PT Goal: Supine/Side to Sit - Progress: Progressing toward goal Pt will go Sit to Stand: with supervision PT Goal: Sit to Stand - Progress: Progressing toward goal Pt will go Stand to Sit: with supervision PT Goal: Stand to Sit - Progress: Progressing toward goal Pt will Ambulate: 51 - 150 feet;with supervision;with least restrictive assistive device PT Goal: Ambulate - Progress: Progressing toward goal  PT Treatment Precautions/Restrictions  Restrictions Weight Bearing Restrictions: Yes RLE Weight Bearing: Touchdown weight bearing Mobility (including Balance) Bed Mobility Bed Mobility: Yes Supine to Sit: 1: +2 Total assist;Patient percentage (comment) Supine to Sit Details (indicate cue type and reason): Pt assist 50%.  Requires assist for RLE and some assist for trunk with cues for hand placement to self assist trunk.  Transfers Transfers: Yes Sit to Stand: 1: +2 Total assist;From elevated surface;With  upper extremity assist;From bed Sit to Stand Details (indicate cue type and reason): Pt assist 60%.  Assist for forward weight shift with cues for hand placement and RLE placement.  Stand to Sit: 1: +2 Total assist;Patient percentage (comment);With upper extremity assist;With armrests;To chair/3-in-1 Stand to Sit Details: Pt assist 60% with cues for hand placement.  Ambulation/Gait Ambulation/Gait: Yes Ambulation/Gait Assistance: 1: +2 Total assist;Patient percentage (comment) Ambulation/Gait Assistance Details (indicate cue type and reason): Pt assist 70%.  Cues for TTWB and upright posture.  Ambulation Distance (Feet): 30 Feet Assistive device: Rolling walker Gait Pattern: Step-to pattern;Trunk flexed Gait velocity: decreased    Exercise    End of Session PT - End of Session Activity Tolerance: Patient tolerated treatment well Patient left: in chair;with call bell in reach;with family/visitor present Nurse Communication: Mobility status for transfers;Other (comment) General Behavior During Session: [facility] for tasks performed Cognition: [facility] for tasks performed  Page, Meribeth Mattes 03/11/2011, 12:01 PM

## 2011-03-15 ENCOUNTER — Telehealth: Payer: Self-pay

## 2011-03-15 DIAGNOSIS — S72043A Displaced fracture of base of neck of unspecified femur, initial encounter for closed fracture: Secondary | ICD-10-CM | POA: Diagnosis not present

## 2011-03-15 DIAGNOSIS — I2510 Atherosclerotic heart disease of native coronary artery without angina pectoris: Secondary | ICD-10-CM | POA: Diagnosis not present

## 2011-03-15 DIAGNOSIS — I959 Hypotension, unspecified: Secondary | ICD-10-CM | POA: Diagnosis not present

## 2011-03-15 NOTE — Telephone Encounter (Signed)
.  [facility] EMILY STATES HER MOM IS IN [facility] WITH DR Neva Seat AND THEY ARE IN NEED OF HER EKG. ALSO DR Neva Seat (OUR DR Neva Seat) HAD TOLD THEM ABOUT A DIAGNOSIS ON HER MOM AND THEY FORGOT WHAT HE SAID. PLEASE CALL [phone]

## 2011-03-15 NOTE — Telephone Encounter (Signed)
Spoke with daughter and made copy of EKG to take to [facility]. Daughter also wants to fill out consent when she comes in so that anything needed in future can be sent to [facility]. Explained pts Dx of RBBB from EKG/OV last Nov

## 2011-03-17 DIAGNOSIS — K5900 Constipation, unspecified: Secondary | ICD-10-CM | POA: Diagnosis not present

## 2011-03-17 DIAGNOSIS — D62 Acute posthemorrhagic anemia: Secondary | ICD-10-CM | POA: Diagnosis not present

## 2011-03-17 DIAGNOSIS — E876 Hypokalemia: Secondary | ICD-10-CM | POA: Diagnosis not present

## 2011-03-17 DIAGNOSIS — S72043A Displaced fracture of base of neck of unspecified femur, initial encounter for closed fracture: Secondary | ICD-10-CM | POA: Diagnosis not present

## 2011-03-18 DIAGNOSIS — S72043A Displaced fracture of base of neck of unspecified femur, initial encounter for closed fracture: Secondary | ICD-10-CM

## 2011-03-18 DIAGNOSIS — I2510 Atherosclerotic heart disease of native coronary artery without angina pectoris: Secondary | ICD-10-CM | POA: Insufficient documentation

## 2011-03-18 DIAGNOSIS — S72009A Fracture of unspecified part of neck of unspecified femur, initial encounter for closed fracture: Secondary | ICD-10-CM | POA: Diagnosis not present

## 2011-03-18 DIAGNOSIS — I959 Hypotension, unspecified: Secondary | ICD-10-CM

## 2011-03-18 HISTORY — DX: Hypotension, unspecified: I95.9

## 2011-03-18 HISTORY — DX: Displaced fracture of base of neck of unspecified femur, initial encounter for closed fracture: S72.043A

## 2011-03-19 DIAGNOSIS — R7301 Impaired fasting glucose: Secondary | ICD-10-CM

## 2011-03-19 DIAGNOSIS — M62838 Other muscle spasm: Secondary | ICD-10-CM

## 2011-03-19 DIAGNOSIS — R5383 Other fatigue: Secondary | ICD-10-CM | POA: Diagnosis not present

## 2011-03-19 DIAGNOSIS — I2510 Atherosclerotic heart disease of native coronary artery without angina pectoris: Secondary | ICD-10-CM | POA: Diagnosis not present

## 2011-03-19 DIAGNOSIS — R609 Edema, unspecified: Secondary | ICD-10-CM | POA: Diagnosis not present

## 2011-03-19 DIAGNOSIS — D62 Acute posthemorrhagic anemia: Secondary | ICD-10-CM | POA: Diagnosis not present

## 2011-03-19 DIAGNOSIS — B372 Candidiasis of skin and nail: Secondary | ICD-10-CM | POA: Diagnosis not present

## 2011-03-19 DIAGNOSIS — M25559 Pain in unspecified hip: Secondary | ICD-10-CM

## 2011-03-19 DIAGNOSIS — S72043A Displaced fracture of base of neck of unspecified femur, initial encounter for closed fracture: Secondary | ICD-10-CM | POA: Diagnosis not present

## 2011-03-19 DIAGNOSIS — R279 Unspecified lack of coordination: Secondary | ICD-10-CM | POA: Diagnosis not present

## 2011-03-19 DIAGNOSIS — K5900 Constipation, unspecified: Secondary | ICD-10-CM

## 2011-03-19 DIAGNOSIS — K5730 Diverticulosis of large intestine without perforation or abscess without bleeding: Secondary | ICD-10-CM

## 2011-03-19 DIAGNOSIS — R1312 Dysphagia, oropharyngeal phase: Secondary | ICD-10-CM | POA: Diagnosis not present

## 2011-03-19 DIAGNOSIS — G4700 Insomnia, unspecified: Secondary | ICD-10-CM

## 2011-03-19 DIAGNOSIS — M545 Low back pain, unspecified: Secondary | ICD-10-CM

## 2011-03-19 DIAGNOSIS — S72009A Fracture of unspecified part of neck of unspecified femur, initial encounter for closed fracture: Secondary | ICD-10-CM | POA: Diagnosis not present

## 2011-03-19 DIAGNOSIS — R269 Unspecified abnormalities of gait and mobility: Secondary | ICD-10-CM | POA: Diagnosis not present

## 2011-03-19 DIAGNOSIS — M810 Age-related osteoporosis without current pathological fracture: Secondary | ICD-10-CM

## 2011-03-19 DIAGNOSIS — R5381 Other malaise: Secondary | ICD-10-CM

## 2011-03-19 DIAGNOSIS — R079 Chest pain, unspecified: Secondary | ICD-10-CM | POA: Diagnosis not present

## 2011-03-19 DIAGNOSIS — E871 Hypo-osmolality and hyponatremia: Secondary | ICD-10-CM

## 2011-03-19 DIAGNOSIS — I10 Essential (primary) hypertension: Secondary | ICD-10-CM | POA: Diagnosis not present

## 2011-03-19 DIAGNOSIS — I4510 Unspecified right bundle-branch block: Secondary | ICD-10-CM | POA: Diagnosis not present

## 2011-03-19 DIAGNOSIS — L259 Unspecified contact dermatitis, unspecified cause: Secondary | ICD-10-CM | POA: Diagnosis not present

## 2011-03-19 HISTORY — DX: Other malaise: R53.81

## 2011-03-19 HISTORY — DX: Diverticulosis of large intestine without perforation or abscess without bleeding: K57.30

## 2011-03-19 HISTORY — DX: Impaired fasting glucose: R73.01

## 2011-03-19 HISTORY — DX: Constipation, unspecified: K59.00

## 2011-03-19 HISTORY — DX: Other muscle spasm: M62.838

## 2011-03-19 HISTORY — DX: Insomnia, unspecified: G47.00

## 2011-03-19 HISTORY — DX: Hypo-osmolality and hyponatremia: E87.1

## 2011-03-19 HISTORY — DX: Age-related osteoporosis without current pathological fracture: M81.0

## 2011-03-19 HISTORY — DX: Candidiasis of skin and nail: B37.2

## 2011-03-19 HISTORY — DX: Low back pain, unspecified: M54.50

## 2011-03-19 HISTORY — DX: Pain in unspecified hip: M25.559

## 2011-03-26 DIAGNOSIS — E876 Hypokalemia: Secondary | ICD-10-CM

## 2011-03-26 HISTORY — DX: Hypokalemia: E87.6

## 2011-04-05 ENCOUNTER — Ambulatory Visit (INDEPENDENT_AMBULATORY_CARE_PROVIDER_SITE_OTHER): Payer: Medicare Other | Admitting: Cardiovascular Disease

## 2011-04-05 ENCOUNTER — Encounter: Payer: Self-pay | Admitting: Cardiovascular Disease

## 2011-04-05 DIAGNOSIS — I4510 Unspecified right bundle-branch block: Secondary | ICD-10-CM | POA: Diagnosis not present

## 2011-04-05 DIAGNOSIS — S72009A Fracture of unspecified part of neck of unspecified femur, initial encounter for closed fracture: Secondary | ICD-10-CM

## 2011-04-05 DIAGNOSIS — R079 Chest pain, unspecified: Secondary | ICD-10-CM | POA: Insufficient documentation

## 2011-04-05 NOTE — Assessment & Plan Note (Signed)
Continue rehab at [facility] with no limitations.  Making good progress

## 2011-04-05 NOTE — Assessment & Plan Note (Signed)
Will try to get old ECG;s from Dr Altheimer.  ICRBBB at her age not pathologic.  No evidence of advance AV block

## 2011-04-05 NOTE — Progress Notes (Signed)
Patient ID: Cassandra Glenn, female   DOB: 07/01/1920, 78 y.o.   MRN: [id number] you referred from [facility] for chest pain and abnormal ECG.  She fractured her hip last month.  Postop course with mild anemia and hypoxemia.  She has had a couple of episodes of atypical nonexertional pain.  Actually they were epigastric in nature and not chest.  Rehab is progressing.  She was at [facility] with Sharia Reeve and is now at [facility] and ambulating with walker.  No dyspnea, cough palpitations or presyncope.  Previously seen by Dr Altheimer.  No old ECG;s in chart.  ECG in hospital with ICRBBB.    ROS: Denies fever, malais, weight loss, blurry vision, decreased visual acuity, cough, sputum, SOB, hemoptysis, pleuritic pain, palpitaitons, heartburn, abdominal pain, melena, lower extremity edema, claudication, or rash.  All other systems reviewed and negative   General: Affect appropriate Elderly white female HEENT: normal Neck supple with no adenopathy Previous mastectomy JVP normal no bruits no thyromegaly Lungs clear with no wheezing and good diaphragmatic motion Heart:  S1/S2 no murmur,rub, gallop or click PMI normal Abdomen: benighn, BS positve, no tenderness, no AAA no bruit.  No HSM or HJR Distal pulses intact with no bruits No edema Neuro non-focal Skin warm and dry No muscular weakness  Medications Current Outpatient Prescriptions  Medication Sig Dispense Refill  . acetaminophen (TYLENOL) 650 MG CR tablet Take 650 mg by mouth every 8 (eight) hours as needed.      Marland Kitchen aspirin 325 MG tablet Take 1 tablet (325 mg total) by mouth 2 (two) times daily.  60 tablet  0  . Bilberry 100 MG CAPS Take 1 capsule by mouth daily.      . Calcium Carbonate-Vitamin D (CALCIUM 600/VITAMIN D PO) Take by mouth 2 (two) times daily.      . cholecalciferol (VITAMIN D) 400 UNITS TABS Take 400 Units by mouth daily.      . fish oil-omega-3 fatty acids 1000 MG capsule Take 2 g by mouth daily.      .  Glucosamine-Vitamin D (GLUCOSAMINE PLUS VITAMIN D) 1500-200 MG-UNIT TABS Take by mouth 2 (two) times daily.      Marland Kitchen HYDROcodone-acetaminophen (NORCO) 5-325 MG per tablet Take 1 tablet by mouth every 6 (six) hours as needed.      . Multiple Vitamins-Minerals (PRESERVISION AREDS 2) CAPS Take 2 capsules by mouth daily.      . polycarbophil (FIBERCON) 625 MG tablet Take 625 mg by mouth daily.      . Probiotic Product (RESTORA) CAPS Take 1 capsule by mouth daily.      Marland Kitchen zolpidem (AMBIEN) 5 MG tablet Take 5 mg by mouth at bedtime as needed. sleep        Allergies Review of patient's allergies indicates no known allergies.  Family History: No family history on file.  Social History: History   Social History  . Marital Status: Married    Spouse Name: N/A    Number of Children: N/A  . Years of Education: N/A   Occupational History  . Not on file.   Social History Main Topics  . Smoking status: Never Smoker   . Smokeless tobacco: Not on file  . Alcohol Use:      occasional glass  . Drug Use: No  . Sexually Active:    Other Topics Concern  . Not on file   Social History Narrative  . No narrative on file    Electrocardiogram:  03/10/11  SR ICRBBB  Assessment and Plan

## 2011-04-05 NOTE — Patient Instructions (Signed)
Your physician wants you to follow-up in:  6 MONTHS WITH DR NISHAN  You will receive a reminder letter in the mail two months in advance. If you don't receive a letter, please call our office to schedule the follow-up appointment. Your physician recommends that you continue on your current medications as directed. Please refer to the Current Medication list given to you today. 

## 2011-04-05 NOTE — Assessment & Plan Note (Signed)
Atypical.  She mad it through hip surgery at age 78 and is doing well. That is a stress test in and of itself.  No indication for stress testing at this time.

## 2011-04-06 DIAGNOSIS — I2510 Atherosclerotic heart disease of native coronary artery without angina pectoris: Secondary | ICD-10-CM | POA: Diagnosis not present

## 2011-04-06 DIAGNOSIS — D62 Acute posthemorrhagic anemia: Secondary | ICD-10-CM | POA: Diagnosis not present

## 2011-04-06 DIAGNOSIS — M25559 Pain in unspecified hip: Secondary | ICD-10-CM | POA: Diagnosis not present

## 2011-04-06 DIAGNOSIS — R5383 Other fatigue: Secondary | ICD-10-CM | POA: Diagnosis not present

## 2011-04-13 DIAGNOSIS — R5381 Other malaise: Secondary | ICD-10-CM | POA: Diagnosis not present

## 2011-04-13 DIAGNOSIS — R7301 Impaired fasting glucose: Secondary | ICD-10-CM | POA: Diagnosis not present

## 2011-04-13 DIAGNOSIS — D62 Acute posthemorrhagic anemia: Secondary | ICD-10-CM | POA: Diagnosis not present

## 2011-04-13 DIAGNOSIS — L259 Unspecified contact dermatitis, unspecified cause: Secondary | ICD-10-CM | POA: Diagnosis not present

## 2011-04-13 DIAGNOSIS — M25559 Pain in unspecified hip: Secondary | ICD-10-CM | POA: Diagnosis not present

## 2011-04-19 DIAGNOSIS — S72009A Fracture of unspecified part of neck of unspecified femur, initial encounter for closed fracture: Secondary | ICD-10-CM | POA: Diagnosis not present

## 2011-05-07 DIAGNOSIS — M25559 Pain in unspecified hip: Secondary | ICD-10-CM | POA: Diagnosis not present

## 2011-05-07 DIAGNOSIS — D62 Acute posthemorrhagic anemia: Secondary | ICD-10-CM | POA: Diagnosis not present

## 2011-05-07 DIAGNOSIS — R609 Edema, unspecified: Secondary | ICD-10-CM | POA: Diagnosis not present

## 2011-05-07 DIAGNOSIS — K5900 Constipation, unspecified: Secondary | ICD-10-CM | POA: Diagnosis not present

## 2011-05-07 DIAGNOSIS — L259 Unspecified contact dermatitis, unspecified cause: Secondary | ICD-10-CM

## 2011-05-07 HISTORY — DX: Unspecified contact dermatitis, unspecified cause: L25.9

## 2011-05-07 HISTORY — DX: Edema, unspecified: R60.9

## 2011-05-27 DIAGNOSIS — H353290 Exudative age-related macular degeneration, unspecified eye, stage unspecified: Secondary | ICD-10-CM | POA: Diagnosis not present

## 2011-05-27 DIAGNOSIS — H43819 Vitreous degeneration, unspecified eye: Secondary | ICD-10-CM | POA: Diagnosis not present

## 2011-05-27 DIAGNOSIS — Z961 Presence of intraocular lens: Secondary | ICD-10-CM | POA: Diagnosis not present

## 2011-05-27 DIAGNOSIS — Z9889 Other specified postprocedural states: Secondary | ICD-10-CM | POA: Insufficient documentation

## 2011-05-27 DIAGNOSIS — H35379 Puckering of macula, unspecified eye: Secondary | ICD-10-CM | POA: Diagnosis not present

## 2011-05-27 DIAGNOSIS — H353190 Nonexudative age-related macular degeneration, unspecified eye, stage unspecified: Secondary | ICD-10-CM | POA: Diagnosis not present

## 2011-06-04 DIAGNOSIS — S72009A Fracture of unspecified part of neck of unspecified femur, initial encounter for closed fracture: Secondary | ICD-10-CM | POA: Diagnosis not present

## 2011-06-14 DIAGNOSIS — R279 Unspecified lack of coordination: Secondary | ICD-10-CM | POA: Diagnosis not present

## 2011-06-14 DIAGNOSIS — R262 Difficulty in walking, not elsewhere classified: Secondary | ICD-10-CM | POA: Diagnosis not present

## 2011-06-14 DIAGNOSIS — R269 Unspecified abnormalities of gait and mobility: Secondary | ICD-10-CM | POA: Diagnosis not present

## 2011-06-14 DIAGNOSIS — IMO0002 Reserved for concepts with insufficient information to code with codable children: Secondary | ICD-10-CM | POA: Diagnosis not present

## 2011-06-14 DIAGNOSIS — M6281 Muscle weakness (generalized): Secondary | ICD-10-CM | POA: Diagnosis not present

## 2011-06-15 DIAGNOSIS — D4959 Neoplasm of unspecified behavior of other genitourinary organ: Secondary | ICD-10-CM | POA: Diagnosis not present

## 2011-06-15 DIAGNOSIS — N949 Unspecified condition associated with female genital organs and menstrual cycle: Secondary | ICD-10-CM | POA: Diagnosis not present

## 2011-06-17 DIAGNOSIS — IMO0002 Reserved for concepts with insufficient information to code with codable children: Secondary | ICD-10-CM | POA: Diagnosis not present

## 2011-06-17 DIAGNOSIS — R279 Unspecified lack of coordination: Secondary | ICD-10-CM | POA: Diagnosis not present

## 2011-06-17 DIAGNOSIS — M6281 Muscle weakness (generalized): Secondary | ICD-10-CM | POA: Diagnosis not present

## 2011-06-17 DIAGNOSIS — R262 Difficulty in walking, not elsewhere classified: Secondary | ICD-10-CM | POA: Diagnosis not present

## 2011-06-17 DIAGNOSIS — R269 Unspecified abnormalities of gait and mobility: Secondary | ICD-10-CM | POA: Diagnosis not present

## 2011-06-18 DIAGNOSIS — R262 Difficulty in walking, not elsewhere classified: Secondary | ICD-10-CM | POA: Diagnosis not present

## 2011-06-18 DIAGNOSIS — R269 Unspecified abnormalities of gait and mobility: Secondary | ICD-10-CM | POA: Diagnosis not present

## 2011-06-18 DIAGNOSIS — IMO0002 Reserved for concepts with insufficient information to code with codable children: Secondary | ICD-10-CM | POA: Diagnosis not present

## 2011-06-18 DIAGNOSIS — M6281 Muscle weakness (generalized): Secondary | ICD-10-CM | POA: Diagnosis not present

## 2011-06-18 DIAGNOSIS — R279 Unspecified lack of coordination: Secondary | ICD-10-CM | POA: Diagnosis not present

## 2011-06-22 DIAGNOSIS — IMO0002 Reserved for concepts with insufficient information to code with codable children: Secondary | ICD-10-CM | POA: Diagnosis not present

## 2011-06-22 DIAGNOSIS — R262 Difficulty in walking, not elsewhere classified: Secondary | ICD-10-CM | POA: Diagnosis not present

## 2011-06-22 DIAGNOSIS — R269 Unspecified abnormalities of gait and mobility: Secondary | ICD-10-CM | POA: Diagnosis not present

## 2011-06-22 DIAGNOSIS — M6281 Muscle weakness (generalized): Secondary | ICD-10-CM | POA: Diagnosis not present

## 2011-06-22 DIAGNOSIS — R279 Unspecified lack of coordination: Secondary | ICD-10-CM | POA: Diagnosis not present

## 2011-06-24 DIAGNOSIS — R262 Difficulty in walking, not elsewhere classified: Secondary | ICD-10-CM | POA: Diagnosis not present

## 2011-06-24 DIAGNOSIS — M6281 Muscle weakness (generalized): Secondary | ICD-10-CM | POA: Diagnosis not present

## 2011-06-24 DIAGNOSIS — R279 Unspecified lack of coordination: Secondary | ICD-10-CM | POA: Diagnosis not present

## 2011-06-24 DIAGNOSIS — IMO0002 Reserved for concepts with insufficient information to code with codable children: Secondary | ICD-10-CM | POA: Diagnosis not present

## 2011-06-24 DIAGNOSIS — R269 Unspecified abnormalities of gait and mobility: Secondary | ICD-10-CM | POA: Diagnosis not present

## 2011-06-25 DIAGNOSIS — R269 Unspecified abnormalities of gait and mobility: Secondary | ICD-10-CM | POA: Diagnosis not present

## 2011-06-25 DIAGNOSIS — M6281 Muscle weakness (generalized): Secondary | ICD-10-CM | POA: Diagnosis not present

## 2011-06-25 DIAGNOSIS — R279 Unspecified lack of coordination: Secondary | ICD-10-CM | POA: Diagnosis not present

## 2011-06-25 DIAGNOSIS — IMO0002 Reserved for concepts with insufficient information to code with codable children: Secondary | ICD-10-CM | POA: Diagnosis not present

## 2011-06-25 DIAGNOSIS — R262 Difficulty in walking, not elsewhere classified: Secondary | ICD-10-CM | POA: Diagnosis not present

## 2011-06-28 DIAGNOSIS — M6281 Muscle weakness (generalized): Secondary | ICD-10-CM | POA: Diagnosis not present

## 2011-06-28 DIAGNOSIS — R279 Unspecified lack of coordination: Secondary | ICD-10-CM | POA: Diagnosis not present

## 2011-06-28 DIAGNOSIS — R269 Unspecified abnormalities of gait and mobility: Secondary | ICD-10-CM | POA: Diagnosis not present

## 2011-06-28 DIAGNOSIS — IMO0002 Reserved for concepts with insufficient information to code with codable children: Secondary | ICD-10-CM | POA: Diagnosis not present

## 2011-06-28 DIAGNOSIS — R262 Difficulty in walking, not elsewhere classified: Secondary | ICD-10-CM | POA: Diagnosis not present

## 2011-06-29 DIAGNOSIS — R279 Unspecified lack of coordination: Secondary | ICD-10-CM | POA: Diagnosis not present

## 2011-06-29 DIAGNOSIS — IMO0002 Reserved for concepts with insufficient information to code with codable children: Secondary | ICD-10-CM | POA: Diagnosis not present

## 2011-06-29 DIAGNOSIS — R262 Difficulty in walking, not elsewhere classified: Secondary | ICD-10-CM | POA: Diagnosis not present

## 2011-06-29 DIAGNOSIS — M6281 Muscle weakness (generalized): Secondary | ICD-10-CM | POA: Diagnosis not present

## 2011-06-29 DIAGNOSIS — R269 Unspecified abnormalities of gait and mobility: Secondary | ICD-10-CM | POA: Diagnosis not present

## 2011-06-30 DIAGNOSIS — N760 Acute vaginitis: Secondary | ICD-10-CM | POA: Diagnosis not present

## 2011-07-01 DIAGNOSIS — R262 Difficulty in walking, not elsewhere classified: Secondary | ICD-10-CM | POA: Diagnosis not present

## 2011-07-01 DIAGNOSIS — M6281 Muscle weakness (generalized): Secondary | ICD-10-CM | POA: Diagnosis not present

## 2011-07-01 DIAGNOSIS — IMO0002 Reserved for concepts with insufficient information to code with codable children: Secondary | ICD-10-CM | POA: Diagnosis not present

## 2011-07-01 DIAGNOSIS — R279 Unspecified lack of coordination: Secondary | ICD-10-CM | POA: Diagnosis not present

## 2011-07-01 DIAGNOSIS — R269 Unspecified abnormalities of gait and mobility: Secondary | ICD-10-CM | POA: Diagnosis not present

## 2011-07-05 DIAGNOSIS — R279 Unspecified lack of coordination: Secondary | ICD-10-CM | POA: Diagnosis not present

## 2011-07-05 DIAGNOSIS — R269 Unspecified abnormalities of gait and mobility: Secondary | ICD-10-CM | POA: Diagnosis not present

## 2011-07-05 DIAGNOSIS — R262 Difficulty in walking, not elsewhere classified: Secondary | ICD-10-CM | POA: Diagnosis not present

## 2011-07-05 DIAGNOSIS — M6281 Muscle weakness (generalized): Secondary | ICD-10-CM | POA: Diagnosis not present

## 2011-07-05 DIAGNOSIS — IMO0002 Reserved for concepts with insufficient information to code with codable children: Secondary | ICD-10-CM | POA: Diagnosis not present

## 2011-07-06 DIAGNOSIS — R279 Unspecified lack of coordination: Secondary | ICD-10-CM | POA: Diagnosis not present

## 2011-07-06 DIAGNOSIS — IMO0002 Reserved for concepts with insufficient information to code with codable children: Secondary | ICD-10-CM | POA: Diagnosis not present

## 2011-07-06 DIAGNOSIS — M6281 Muscle weakness (generalized): Secondary | ICD-10-CM | POA: Diagnosis not present

## 2011-07-06 DIAGNOSIS — R262 Difficulty in walking, not elsewhere classified: Secondary | ICD-10-CM | POA: Diagnosis not present

## 2011-07-06 DIAGNOSIS — R269 Unspecified abnormalities of gait and mobility: Secondary | ICD-10-CM | POA: Diagnosis not present

## 2011-07-08 DIAGNOSIS — R269 Unspecified abnormalities of gait and mobility: Secondary | ICD-10-CM | POA: Diagnosis not present

## 2011-07-08 DIAGNOSIS — R279 Unspecified lack of coordination: Secondary | ICD-10-CM | POA: Diagnosis not present

## 2011-07-08 DIAGNOSIS — R262 Difficulty in walking, not elsewhere classified: Secondary | ICD-10-CM | POA: Diagnosis not present

## 2011-07-08 DIAGNOSIS — IMO0002 Reserved for concepts with insufficient information to code with codable children: Secondary | ICD-10-CM | POA: Diagnosis not present

## 2011-07-08 DIAGNOSIS — M6281 Muscle weakness (generalized): Secondary | ICD-10-CM | POA: Diagnosis not present

## 2011-07-13 DIAGNOSIS — R269 Unspecified abnormalities of gait and mobility: Secondary | ICD-10-CM | POA: Diagnosis not present

## 2011-07-13 DIAGNOSIS — IMO0002 Reserved for concepts with insufficient information to code with codable children: Secondary | ICD-10-CM | POA: Diagnosis not present

## 2011-07-13 DIAGNOSIS — R262 Difficulty in walking, not elsewhere classified: Secondary | ICD-10-CM | POA: Diagnosis not present

## 2011-07-13 DIAGNOSIS — R279 Unspecified lack of coordination: Secondary | ICD-10-CM | POA: Diagnosis not present

## 2011-07-13 DIAGNOSIS — M6281 Muscle weakness (generalized): Secondary | ICD-10-CM | POA: Diagnosis not present

## 2011-07-15 DIAGNOSIS — R269 Unspecified abnormalities of gait and mobility: Secondary | ICD-10-CM | POA: Diagnosis not present

## 2011-07-15 DIAGNOSIS — M6281 Muscle weakness (generalized): Secondary | ICD-10-CM | POA: Diagnosis not present

## 2011-07-15 DIAGNOSIS — R262 Difficulty in walking, not elsewhere classified: Secondary | ICD-10-CM | POA: Diagnosis not present

## 2011-07-15 DIAGNOSIS — IMO0002 Reserved for concepts with insufficient information to code with codable children: Secondary | ICD-10-CM | POA: Diagnosis not present

## 2011-07-15 DIAGNOSIS — R279 Unspecified lack of coordination: Secondary | ICD-10-CM | POA: Diagnosis not present

## 2011-07-19 DIAGNOSIS — Z853 Personal history of malignant neoplasm of breast: Secondary | ICD-10-CM | POA: Diagnosis not present

## 2011-07-20 DIAGNOSIS — R269 Unspecified abnormalities of gait and mobility: Secondary | ICD-10-CM | POA: Diagnosis not present

## 2011-07-20 DIAGNOSIS — R262 Difficulty in walking, not elsewhere classified: Secondary | ICD-10-CM | POA: Diagnosis not present

## 2011-07-20 DIAGNOSIS — IMO0002 Reserved for concepts with insufficient information to code with codable children: Secondary | ICD-10-CM | POA: Diagnosis not present

## 2011-07-20 DIAGNOSIS — R279 Unspecified lack of coordination: Secondary | ICD-10-CM | POA: Diagnosis not present

## 2011-07-20 DIAGNOSIS — M6281 Muscle weakness (generalized): Secondary | ICD-10-CM | POA: Diagnosis not present

## 2011-07-22 DIAGNOSIS — R269 Unspecified abnormalities of gait and mobility: Secondary | ICD-10-CM | POA: Diagnosis not present

## 2011-07-22 DIAGNOSIS — IMO0002 Reserved for concepts with insufficient information to code with codable children: Secondary | ICD-10-CM | POA: Diagnosis not present

## 2011-07-22 DIAGNOSIS — R279 Unspecified lack of coordination: Secondary | ICD-10-CM | POA: Diagnosis not present

## 2011-07-22 DIAGNOSIS — M6281 Muscle weakness (generalized): Secondary | ICD-10-CM | POA: Diagnosis not present

## 2011-07-22 DIAGNOSIS — R262 Difficulty in walking, not elsewhere classified: Secondary | ICD-10-CM | POA: Diagnosis not present

## 2011-07-27 DIAGNOSIS — R279 Unspecified lack of coordination: Secondary | ICD-10-CM | POA: Diagnosis not present

## 2011-07-27 DIAGNOSIS — R269 Unspecified abnormalities of gait and mobility: Secondary | ICD-10-CM | POA: Diagnosis not present

## 2011-07-27 DIAGNOSIS — R262 Difficulty in walking, not elsewhere classified: Secondary | ICD-10-CM | POA: Diagnosis not present

## 2011-07-27 DIAGNOSIS — M6281 Muscle weakness (generalized): Secondary | ICD-10-CM | POA: Diagnosis not present

## 2011-07-27 DIAGNOSIS — IMO0002 Reserved for concepts with insufficient information to code with codable children: Secondary | ICD-10-CM | POA: Diagnosis not present

## 2011-07-29 DIAGNOSIS — M6281 Muscle weakness (generalized): Secondary | ICD-10-CM | POA: Diagnosis not present

## 2011-07-29 DIAGNOSIS — R262 Difficulty in walking, not elsewhere classified: Secondary | ICD-10-CM | POA: Diagnosis not present

## 2011-07-29 DIAGNOSIS — IMO0002 Reserved for concepts with insufficient information to code with codable children: Secondary | ICD-10-CM | POA: Diagnosis not present

## 2011-07-29 DIAGNOSIS — R269 Unspecified abnormalities of gait and mobility: Secondary | ICD-10-CM | POA: Diagnosis not present

## 2011-07-29 DIAGNOSIS — R279 Unspecified lack of coordination: Secondary | ICD-10-CM | POA: Diagnosis not present

## 2011-08-10 DIAGNOSIS — Z01419 Encounter for gynecological examination (general) (routine) without abnormal findings: Secondary | ICD-10-CM | POA: Diagnosis not present

## 2011-08-19 DIAGNOSIS — M25559 Pain in unspecified hip: Secondary | ICD-10-CM | POA: Diagnosis not present

## 2011-08-19 DIAGNOSIS — M545 Low back pain: Secondary | ICD-10-CM | POA: Diagnosis not present

## 2011-08-19 DIAGNOSIS — S72009A Fracture of unspecified part of neck of unspecified femur, initial encounter for closed fracture: Secondary | ICD-10-CM | POA: Diagnosis not present

## 2011-08-27 DIAGNOSIS — M545 Low back pain: Secondary | ICD-10-CM | POA: Diagnosis not present

## 2011-08-30 DIAGNOSIS — I4510 Unspecified right bundle-branch block: Secondary | ICD-10-CM

## 2011-08-30 HISTORY — DX: Unspecified right bundle-branch block: I45.10

## 2011-08-31 DIAGNOSIS — M545 Low back pain: Secondary | ICD-10-CM | POA: Diagnosis not present

## 2011-08-31 DIAGNOSIS — L259 Unspecified contact dermatitis, unspecified cause: Secondary | ICD-10-CM | POA: Diagnosis not present

## 2011-09-02 DIAGNOSIS — H3530 Unspecified macular degeneration: Secondary | ICD-10-CM | POA: Diagnosis not present

## 2011-09-02 DIAGNOSIS — Z961 Presence of intraocular lens: Secondary | ICD-10-CM | POA: Diagnosis not present

## 2011-09-02 DIAGNOSIS — H35379 Puckering of macula, unspecified eye: Secondary | ICD-10-CM | POA: Diagnosis not present

## 2011-09-02 DIAGNOSIS — H01009 Unspecified blepharitis unspecified eye, unspecified eyelid: Secondary | ICD-10-CM | POA: Diagnosis not present

## 2011-09-06 DIAGNOSIS — M48061 Spinal stenosis, lumbar region without neurogenic claudication: Secondary | ICD-10-CM | POA: Diagnosis not present

## 2011-10-18 DIAGNOSIS — D239 Other benign neoplasm of skin, unspecified: Secondary | ICD-10-CM | POA: Diagnosis not present

## 2011-11-02 DIAGNOSIS — M48062 Spinal stenosis, lumbar region with neurogenic claudication: Secondary | ICD-10-CM

## 2011-11-02 DIAGNOSIS — K5730 Diverticulosis of large intestine without perforation or abscess without bleeding: Secondary | ICD-10-CM | POA: Diagnosis not present

## 2011-11-02 DIAGNOSIS — L259 Unspecified contact dermatitis, unspecified cause: Secondary | ICD-10-CM | POA: Diagnosis not present

## 2011-11-02 DIAGNOSIS — M545 Low back pain: Secondary | ICD-10-CM | POA: Diagnosis not present

## 2011-11-02 DIAGNOSIS — M4800 Spinal stenosis, site unspecified: Secondary | ICD-10-CM | POA: Diagnosis not present

## 2011-11-02 DIAGNOSIS — M6530 Trigger finger, unspecified finger: Secondary | ICD-10-CM

## 2011-11-02 HISTORY — DX: Spinal stenosis, lumbar region with neurogenic claudication: M48.062

## 2011-11-02 HISTORY — DX: Trigger finger, unspecified finger: M65.30

## 2011-11-05 DIAGNOSIS — Z23 Encounter for immunization: Secondary | ICD-10-CM | POA: Diagnosis not present

## 2011-11-25 DIAGNOSIS — H43819 Vitreous degeneration, unspecified eye: Secondary | ICD-10-CM | POA: Diagnosis not present

## 2011-11-25 DIAGNOSIS — H353190 Nonexudative age-related macular degeneration, unspecified eye, stage unspecified: Secondary | ICD-10-CM | POA: Diagnosis not present

## 2011-11-25 DIAGNOSIS — H353290 Exudative age-related macular degeneration, unspecified eye, stage unspecified: Secondary | ICD-10-CM | POA: Diagnosis not present

## 2011-11-25 DIAGNOSIS — Z961 Presence of intraocular lens: Secondary | ICD-10-CM | POA: Diagnosis not present

## 2011-11-25 DIAGNOSIS — H35379 Puckering of macula, unspecified eye: Secondary | ICD-10-CM | POA: Diagnosis not present

## 2012-02-29 DIAGNOSIS — M6530 Trigger finger, unspecified finger: Secondary | ICD-10-CM | POA: Diagnosis not present

## 2012-02-29 DIAGNOSIS — M545 Low back pain: Secondary | ICD-10-CM | POA: Diagnosis not present

## 2012-02-29 DIAGNOSIS — N760 Acute vaginitis: Secondary | ICD-10-CM

## 2012-02-29 DIAGNOSIS — L679 Hair color and hair shaft abnormality, unspecified: Secondary | ICD-10-CM | POA: Diagnosis not present

## 2012-02-29 DIAGNOSIS — M48062 Spinal stenosis, lumbar region with neurogenic claudication: Secondary | ICD-10-CM | POA: Diagnosis not present

## 2012-02-29 DIAGNOSIS — C50919 Malignant neoplasm of unspecified site of unspecified female breast: Secondary | ICD-10-CM | POA: Diagnosis not present

## 2012-02-29 HISTORY — DX: Acute vaginitis: N76.0

## 2012-02-29 HISTORY — DX: Hair color and hair shaft abnormality, unspecified: L67.9

## 2012-03-01 DIAGNOSIS — Z853 Personal history of malignant neoplasm of breast: Secondary | ICD-10-CM | POA: Diagnosis not present

## 2012-03-30 DIAGNOSIS — N390 Urinary tract infection, site not specified: Secondary | ICD-10-CM | POA: Diagnosis not present

## 2012-06-22 DIAGNOSIS — L679 Hair color and hair shaft abnormality, unspecified: Secondary | ICD-10-CM | POA: Diagnosis not present

## 2012-06-22 DIAGNOSIS — M6530 Trigger finger, unspecified finger: Secondary | ICD-10-CM | POA: Diagnosis not present

## 2012-06-22 DIAGNOSIS — C50919 Malignant neoplasm of unspecified site of unspecified female breast: Secondary | ICD-10-CM | POA: Diagnosis not present

## 2012-06-22 DIAGNOSIS — M545 Low back pain: Secondary | ICD-10-CM | POA: Diagnosis not present

## 2012-06-23 ENCOUNTER — Encounter: Payer: Self-pay | Admitting: *Deleted

## 2012-06-28 DIAGNOSIS — M2010 Hallux valgus (acquired), unspecified foot: Secondary | ICD-10-CM | POA: Diagnosis not present

## 2012-06-28 DIAGNOSIS — L600 Ingrowing nail: Secondary | ICD-10-CM | POA: Diagnosis not present

## 2012-06-28 DIAGNOSIS — M79609 Pain in unspecified limb: Secondary | ICD-10-CM | POA: Diagnosis not present

## 2012-07-04 ENCOUNTER — Encounter: Payer: Self-pay | Admitting: Internal Medicine

## 2012-07-04 ENCOUNTER — Non-Acute Institutional Stay: Payer: Medicare Other | Admitting: Internal Medicine

## 2012-07-04 VITALS — BP 122/62 | HR 68 | Ht 62.0 in | Wt 130.0 lb

## 2012-07-04 DIAGNOSIS — G4700 Insomnia, unspecified: Secondary | ICD-10-CM

## 2012-07-04 DIAGNOSIS — M48062 Spinal stenosis, lumbar region with neurogenic claudication: Secondary | ICD-10-CM

## 2012-07-04 DIAGNOSIS — M6530 Trigger finger, unspecified finger: Secondary | ICD-10-CM

## 2012-07-04 DIAGNOSIS — S72009S Fracture of unspecified part of neck of unspecified femur, sequela: Secondary | ICD-10-CM | POA: Diagnosis not present

## 2012-07-04 DIAGNOSIS — M545 Low back pain: Secondary | ICD-10-CM | POA: Diagnosis not present

## 2012-07-04 DIAGNOSIS — C50919 Malignant neoplasm of unspecified site of unspecified female breast: Secondary | ICD-10-CM | POA: Diagnosis not present

## 2012-07-04 DIAGNOSIS — D500 Iron deficiency anemia secondary to blood loss (chronic): Secondary | ICD-10-CM

## 2012-07-04 DIAGNOSIS — I2510 Atherosclerotic heart disease of native coronary artery without angina pectoris: Secondary | ICD-10-CM | POA: Diagnosis not present

## 2012-07-04 DIAGNOSIS — R7301 Impaired fasting glucose: Secondary | ICD-10-CM

## 2012-07-04 DIAGNOSIS — S72041S Displaced fracture of base of neck of right femur, sequela: Secondary | ICD-10-CM

## 2012-07-05 DIAGNOSIS — B351 Tinea unguium: Secondary | ICD-10-CM | POA: Diagnosis not present

## 2012-07-05 DIAGNOSIS — M79609 Pain in unspecified limb: Secondary | ICD-10-CM | POA: Diagnosis not present

## 2012-07-17 ENCOUNTER — Other Ambulatory Visit: Payer: Self-pay | Admitting: Dermatology

## 2012-07-17 DIAGNOSIS — L259 Unspecified contact dermatitis, unspecified cause: Secondary | ICD-10-CM | POA: Diagnosis not present

## 2012-07-17 DIAGNOSIS — C44711 Basal cell carcinoma of skin of unspecified lower limb, including hip: Secondary | ICD-10-CM | POA: Diagnosis not present

## 2012-07-21 DIAGNOSIS — M48061 Spinal stenosis, lumbar region without neurogenic claudication: Secondary | ICD-10-CM | POA: Diagnosis not present

## 2012-07-21 DIAGNOSIS — M76899 Other specified enthesopathies of unspecified lower limb, excluding foot: Secondary | ICD-10-CM | POA: Diagnosis not present

## 2012-07-21 DIAGNOSIS — S72009A Fracture of unspecified part of neck of unspecified femur, initial encounter for closed fracture: Secondary | ICD-10-CM | POA: Diagnosis not present

## 2012-07-24 DIAGNOSIS — Z1231 Encounter for screening mammogram for malignant neoplasm of breast: Secondary | ICD-10-CM | POA: Diagnosis not present

## 2012-08-09 ENCOUNTER — Encounter: Payer: Self-pay | Admitting: Internal Medicine

## 2012-08-17 DIAGNOSIS — H353190 Nonexudative age-related macular degeneration, unspecified eye, stage unspecified: Secondary | ICD-10-CM | POA: Diagnosis not present

## 2012-08-17 DIAGNOSIS — Z961 Presence of intraocular lens: Secondary | ICD-10-CM | POA: Diagnosis not present

## 2012-08-17 DIAGNOSIS — H353290 Exudative age-related macular degeneration, unspecified eye, stage unspecified: Secondary | ICD-10-CM | POA: Diagnosis not present

## 2012-08-17 DIAGNOSIS — H43819 Vitreous degeneration, unspecified eye: Secondary | ICD-10-CM | POA: Diagnosis not present

## 2012-08-17 DIAGNOSIS — H35379 Puckering of macula, unspecified eye: Secondary | ICD-10-CM | POA: Diagnosis not present

## 2012-09-04 DIAGNOSIS — Z961 Presence of intraocular lens: Secondary | ICD-10-CM | POA: Diagnosis not present

## 2012-09-04 DIAGNOSIS — H01009 Unspecified blepharitis unspecified eye, unspecified eyelid: Secondary | ICD-10-CM | POA: Diagnosis not present

## 2012-09-04 DIAGNOSIS — H35379 Puckering of macula, unspecified eye: Secondary | ICD-10-CM | POA: Diagnosis not present

## 2012-09-04 DIAGNOSIS — H3530 Unspecified macular degeneration: Secondary | ICD-10-CM | POA: Diagnosis not present

## 2012-09-07 DIAGNOSIS — G4700 Insomnia, unspecified: Secondary | ICD-10-CM | POA: Insufficient documentation

## 2012-09-07 DIAGNOSIS — M48062 Spinal stenosis, lumbar region with neurogenic claudication: Secondary | ICD-10-CM | POA: Insufficient documentation

## 2012-09-07 DIAGNOSIS — M6530 Trigger finger, unspecified finger: Secondary | ICD-10-CM | POA: Insufficient documentation

## 2012-09-07 NOTE — Patient Instructions (Signed)
Continue current medications. 

## 2012-09-07 NOTE — Progress Notes (Signed)
Subjective:    Patient ID: Cassandra Glenn, female    DOB: May 07, 1920, 79 y.o.   MRN: [id number]  HPI Patient has several complaints on today's visit.  She has constant low back pain. There is a history of spinal stenosis.  She feels a slippage in the right hip. She has a history of her previous right femoral fracture.  She feels bloated. She is very gassy and flatulent. She has a pins and needles feelings in the lower abdomen. She has a history of diverticulosis.  She is not sleeping well and has insomnia.  There is a history of prior elevation of glucose. Her last lab was done 06/22/2012. It showed a normal glucose.  Current Outpatient Prescriptions on File Prior to Visit  Medication Sig Dispense Refill  . acetaminophen (TYLENOL) 500 MG tablet Take 500 mg by mouth every 6 (six) hours as needed for pain.      . Bilberry 100 MG CAPS Take 1 capsule by mouth daily.      . Calcium Carbonate-Vitamin D (CALCIUM 600/VITAMIN D PO) Take by mouth 2 (two) times daily.      . cholecalciferol (VITAMIN D) 400 UNITS TABS Take 400 Units by mouth daily. Take 1 tablet daily for vitamin supplement.      . fish oil-omega-3 fatty acids 1000 MG capsule Take 2 g by mouth daily. Take 1 capsule daily for vitamin supplement.      . Glucosamine-Vitamin D (GLUCOSAMINE PLUS VITAMIN D) 1500-200 MG-UNIT TABS Take 1,500 Units by mouth 2 (two) times daily. Take 1 tablet twice daily for joint health      . Multiple Vitamins-Minerals (PRESERVISION AREDS 2) CAPS Take 2 capsules by mouth daily.      . polycarbophil (FIBERCON) 625 MG tablet Take 625 mg by mouth daily.      . Probiotic Product (RESTORA) CAPS Take 1 capsule by mouth daily.      Marland Kitchen zolpidem (AMBIEN) 5 MG tablet Take 5 mg by mouth at bedtime as needed. sleep       No current facility-administered medications on file prior to visit.    Review of Systems  Constitutional: Negative for fever, activity change, fatigue and unexpected weight change.  HENT:  Positive for hearing loss. Negative for ear pain.   Eyes:       History of loss of visual acuity and macular degeneration.  Respiratory:       History of breast discomfort. History of removal of a lump from the right breast.  Cardiovascular: Negative for chest pain, palpitations and leg swelling.  Gastrointestinal:       Denies diarrhea, constipation, heartburn. There has been no nausea or vomiting. She has had intermittent loose stools and burning sensation internally. She frequently complains that her intestines are not working right. There is a previous history of diverticulosis. Her last colonoscopy was in 2008. She has been seen by Dr. Dulce Sellar in the past.  Genitourinary:       Complains of a slow, weak urinary stream. Denies dysuria, frequency, or incontinence.  Musculoskeletal:       Chronic back pain. Pain down the right leg. Some restriction of joint motion of the right hip.. She feels that there is something loose in the right hip area. There is generalized stiffness in the joints. There is a history of spinal stenosis. Left third finger catches on extension sometimes. There is a trigger finger.  Skin:       History of skin discoloration and changes of nails.  Previous history of folliculitis of the left lower leg. Folliculitis has cleared. Denies itching or rash.  Allergic/Immunologic: Negative.   Neurological: Negative for dizziness, tremors, seizures and numbness.       History of spinal stenosis with neurogenic claudication affecting the right leg.  Hematological: Bruises/bleeds easily.  Psychiatric/Behavioral: Negative.        Objective:   Physical Exam  Constitutional: She appears well-developed and well-nourished. No distress.  HENT:  Head: Normocephalic and atraumatic.  Right Ear: External ear normal.  Left Ear: External ear normal.  Nose: Nose normal.  Mouth/Throat: Oropharynx is clear and moist.  Partial loss of hearing. Wears hearing aids.  Eyes: Conjunctivae and  EOM are normal. Pupils are equal, round, and reactive to light.  Wears corrective lenses.  Neck: No JVD present. No tracheal deviation present. No thyromegaly present.  Cardiovascular: Normal rate, regular rhythm, normal heart sounds and intact distal pulses.  Exam reveals no gallop and no friction rub.   No murmur heard. Pulmonary/Chest: No respiratory distress. She has no wheezes. She has no rales. She exhibits no tenderness.  Abdominal: Soft. Bowel sounds are normal. She exhibits no distension and no mass. There is tenderness. There is no rebound and no guarding.  Musculoskeletal:  Trace edema affecting the right lower extremity. Unstable on standing. Uses a wheelchair.  Lymphadenopathy:    She has no cervical adenopathy.  Skin: No rash noted. No erythema. No pallor.  Psychiatric: She has a normal mood and affect. Her behavior is normal. Judgment and thought content normal.      LAB REVIEW  06/22/2012 CBC: Normal  CMP normal    Assessment & Plan:  Lumbago: Chronic and unchanged.  Closed fracture of base of neck of femur, right, sequela: Pain in the right hip and down the right leg. Pains are more likely to be related to her lumbar stenosis with neurogenic claudication, but the feeling of slippage in the right hip area is worrisome. She is seen regularly by Dr. Shelle Iron.  Malignant neoplasm of breast (female), unspecified site: No evidence for relapse.  Coronary atherosclerosis of native coronary artery: No chest pains or palpitations.  Impaired fasting glucose: Normal last lab work  Normocytic anemia due to blood loss: resolved with a normal hemoglobin  Insomnia, unspecified: Chronic. She is not requesting medication.  Trigger finger, acquired: Chronic and unchanged. She does not want referral  Spinal stenosis, lumbar region, with neurogenic claudication: Creating significant problems with mobility.

## 2012-10-12 ENCOUNTER — Other Ambulatory Visit: Payer: Self-pay | Admitting: Dermatology

## 2012-10-12 DIAGNOSIS — C44711 Basal cell carcinoma of skin of unspecified lower limb, including hip: Secondary | ICD-10-CM | POA: Diagnosis not present

## 2012-10-12 DIAGNOSIS — Z23 Encounter for immunization: Secondary | ICD-10-CM | POA: Diagnosis not present

## 2012-10-12 HISTORY — PX: SKIN CANCER EXCISION: SHX779

## 2012-10-31 ENCOUNTER — Non-Acute Institutional Stay: Payer: Medicare Other | Admitting: Internal Medicine

## 2012-10-31 ENCOUNTER — Encounter: Payer: Self-pay | Admitting: Internal Medicine

## 2012-10-31 VITALS — BP 144/64 | HR 72 | Ht 62.0 in | Wt 131.0 lb

## 2012-10-31 DIAGNOSIS — K5900 Constipation, unspecified: Secondary | ICD-10-CM

## 2012-10-31 DIAGNOSIS — C50919 Malignant neoplasm of unspecified site of unspecified female breast: Secondary | ICD-10-CM

## 2012-10-31 DIAGNOSIS — M79609 Pain in unspecified limb: Secondary | ICD-10-CM | POA: Diagnosis not present

## 2012-10-31 DIAGNOSIS — G4700 Insomnia, unspecified: Secondary | ICD-10-CM

## 2012-10-31 DIAGNOSIS — M48062 Spinal stenosis, lumbar region with neurogenic claudication: Secondary | ICD-10-CM

## 2012-10-31 DIAGNOSIS — K589 Irritable bowel syndrome without diarrhea: Secondary | ICD-10-CM

## 2012-10-31 DIAGNOSIS — C4492 Squamous cell carcinoma of skin, unspecified: Secondary | ICD-10-CM

## 2012-10-31 DIAGNOSIS — R102 Pelvic and perineal pain: Secondary | ICD-10-CM

## 2012-10-31 DIAGNOSIS — N949 Unspecified condition associated with female genital organs and menstrual cycle: Secondary | ICD-10-CM

## 2012-10-31 NOTE — Progress Notes (Signed)
Subjective:    Patient ID: Cassandra Glenn, female    DOB: April 21, 1920, 79 y.o.   MRN: [id number]  Chief Complaint  Patient presents with  . Medical Managment of Chronic Issues    chronic back pain, insomnia    HPI Patient returns today for routine followup of multiple issues.  Spinal stenosis, lumbar region, with neurogenic claudication: Unchanged. Her legs hurt after exercise. Tylenol seems to help sometimes.  Cancer of skin, squamous cell: Left medial calf. Removed by Dr. Hortense Ramal about 2 weeks ago.  IBS (irritable bowel syndrome): gassy and bloated feelings. Sluggish movement of stool. Straining with BM. Used Restora and believes it may be helping.  Pain in limb: pain in the legs is felt related to her spinal stenosis. Legshurt after walking or exercising.  Unspecified constipation: See IBS  Malignant neoplasm of breast (female), unspecified site  Insomnia, unspecified: able to fall asleep, but has broken sleep with wakeful intervals.  Pain in the vaginal area: Saw Dr. Marcelle Overlie and was given Estrace cream. She says this has not helped much and it made her burn, so she quit using it. She also claimed it seemed to cause an irritation in the right breast area. Had a previous breast cancer.    Current Outpatient Prescriptions on File Prior to Visit  Medication Sig Dispense Refill  . acetaminophen (TYLENOL) 500 MG tablet Take 500 mg by mouth every 6 (six) hours as needed for pain.      Marland Kitchen aspirin 81 MG tablet Take 81 mg by mouth daily.      . Bilberry 100 MG CAPS Take 1 capsule by mouth daily.      . Calcium Carbonate-Vitamin D (CALCIUM 600/VITAMIN D PO) Take by mouth 2 (two) times daily.      . cholecalciferol (VITAMIN D) 400 UNITS TABS Take 400 Units by mouth daily. Take 1 tablet daily for vitamin supplement.      Marland Kitchen ESTRACE VAGINAL 0.1 MG/GM vaginal cream Insert 2 gr vaginally 2-3 times a week at bedtime      . fish oil-omega-3 fatty acids 1000 MG capsule Take 2 g by mouth daily.  Take 1 capsule daily for vitamin supplement.      . Glucosamine-Vitamin D (GLUCOSAMINE PLUS VITAMIN D) 1500-200 MG-UNIT TABS Take 1,500 Units by mouth 2 (two) times daily. Take 1 tablet twice daily for joint health      . Multiple Vitamin (MULTIVITAMIN) tablet Take 1 tablet by mouth daily.      . Multiple Vitamins-Minerals (PRESERVISION AREDS 2) CAPS Take 2 capsules by mouth daily.      Marland Kitchen nystatin-triamcinolone (MYCOLOG II) cream Apply to affected area three times daily      . polycarbophil (FIBERCON) 625 MG tablet Take 625 mg by mouth daily.      . Probiotic Product (RESTORA) CAPS Take 1 capsule by mouth daily.      Marland Kitchen zolpidem (AMBIEN) 5 MG tablet Take 5 mg by mouth at bedtime as needed. sleep       No current facility-administered medications on file prior to visit.    Review of Systems  Constitutional: Negative for fever, activity change, fatigue and unexpected weight change.  HENT: Positive for hearing loss. Negative for ear pain.   Eyes:       History of loss of visual acuity and macular degeneration.  Respiratory:       History of breast discomfort. History of removal of a lump from the right breast.  Cardiovascular: Negative for chest pain,  palpitations and leg swelling.  Gastrointestinal:       Chronic sluggish stools and straining. She frequently complains that her intestines are not working right. There is a previous history of diverticulosis. Her last colonoscopy was in 2008. She has been seen by Dr. Dulce Sellar in the past.  Genitourinary:       Complains of a slow, weak urinary stream. Denies dysuria, frequency, or incontinence. Vaginal discomfort. Managed by Dr. Nelle Don.  Musculoskeletal:       Chronic back pain. Pain down the right leg. Some restriction of joint motion of the right hip.. She feels that there is something loose in the right hip area. There is generalized stiffness in the joints. There is a history of spinal stenosis. Left third finger catches on extension  sometimes. There is a trigger finger.  Skin:       History of skin discoloration and changes of nails. Previous history of folliculitis of the left lower leg. Folliculitis has cleared. Denies itching or rash.  Allergic/Immunologic: Negative.   Neurological: Negative for dizziness, tremors, seizures and numbness.       History of spinal stenosis with neurogenic claudication affecting the right leg.  Hematological: Bruises/bleeds easily.  Psychiatric/Behavioral: Negative.        Objective:BP 144/64  Pulse 72  Ht 5\' 2"  (1.575 m)  Wt 131 lb (59.421 kg)  BMI 23.95 kg/m2    Physical Exam  Constitutional: She appears well-developed and well-nourished. No distress.  HENT:  Head: Normocephalic and atraumatic.  Right Ear: External ear normal.  Left Ear: External ear normal.  Nose: Nose normal.  Mouth/Throat: Oropharynx is clear and moist.  Partial loss of hearing. Wears hearing aids.  Eyes: Conjunctivae and EOM are normal. Pupils are equal, round, and reactive to light.  Wears corrective lenses.  Neck: No JVD present. No tracheal deviation present. No thyromegaly present.  Cardiovascular: Normal rate, regular rhythm, normal heart sounds and intact distal pulses.  Exam reveals no gallop and no friction rub.   No murmur heard. Pulmonary/Chest: No respiratory distress. She has no wheezes. She has no rales. She exhibits no tenderness.  Abdominal: Soft. Bowel sounds are normal. She exhibits no distension and no mass. There is tenderness. There is no rebound and no guarding.  Musculoskeletal:  Trace edema affecting the right lower extremity. Unstable on standing. Uses a cane.  Lymphadenopathy:    She has no cervical adenopathy.  Skin: No rash noted. No erythema. No pallor.  Psychiatric: She has a normal mood and affect. Her behavior is normal. Judgment and thought content normal.          Assessment & Plan:  Spinal stenosis, lumbar region, with neurogenic claudication: unchanged. She  is a fall risk. She shold continue to use a cane or walker.  Cancer of skin, squamous cell: healing at site of removal  IBS (irritable bowel syndrome): increase fiber and liquids. Continue Restora.  Pain in limb: observe  Unspecified constipation: See IBS  Malignant neoplasm of breast (female), unspecified site: no reoccurrence  Insomnia, unspecified: no new orders  Vaginal pain: continue with Dr. Nelle Don.

## 2012-11-02 DIAGNOSIS — C4492 Squamous cell carcinoma of skin, unspecified: Secondary | ICD-10-CM | POA: Insufficient documentation

## 2012-11-02 DIAGNOSIS — K589 Irritable bowel syndrome without diarrhea: Secondary | ICD-10-CM | POA: Insufficient documentation

## 2012-11-02 DIAGNOSIS — M79609 Pain in unspecified limb: Secondary | ICD-10-CM | POA: Insufficient documentation

## 2012-11-02 DIAGNOSIS — K5900 Constipation, unspecified: Secondary | ICD-10-CM | POA: Insufficient documentation

## 2012-11-03 DIAGNOSIS — R102 Pelvic and perineal pain: Secondary | ICD-10-CM | POA: Insufficient documentation

## 2012-11-03 NOTE — Patient Instructions (Signed)
Continue current medications. Use Restora. Increase fiber in diet and increase fluids. See Dr. Nelle Don about your vaginal discomfort.

## 2012-11-15 DIAGNOSIS — N760 Acute vaginitis: Secondary | ICD-10-CM | POA: Diagnosis not present

## 2012-11-15 DIAGNOSIS — N390 Urinary tract infection, site not specified: Secondary | ICD-10-CM | POA: Diagnosis not present

## 2012-11-19 DIAGNOSIS — M545 Low back pain: Secondary | ICD-10-CM | POA: Diagnosis not present

## 2012-11-23 ENCOUNTER — Inpatient Hospital Stay (HOSPITAL_COMMUNITY)
Admission: EM | Admit: 2012-11-23 | Discharge: 2012-11-28 | DRG: 644 | Disposition: A | Discharge status: Skilled Nursing Facility | Payer: Medicare Other | Attending: Internal Medicine | Admitting: Internal Medicine

## 2012-11-23 ENCOUNTER — Emergency Department (HOSPITAL_COMMUNITY): Payer: Medicare Other

## 2012-11-23 ENCOUNTER — Encounter (HOSPITAL_COMMUNITY): Payer: Self-pay | Admitting: Emergency Medicine

## 2012-11-23 ENCOUNTER — Inpatient Hospital Stay (HOSPITAL_COMMUNITY): Payer: Medicare Other

## 2012-11-23 DIAGNOSIS — I4510 Unspecified right bundle-branch block: Secondary | ICD-10-CM | POA: Diagnosis not present

## 2012-11-23 DIAGNOSIS — R918 Other nonspecific abnormal finding of lung field: Secondary | ICD-10-CM | POA: Diagnosis present

## 2012-11-23 DIAGNOSIS — M4120 Other idiopathic scoliosis, site unspecified: Secondary | ICD-10-CM | POA: Diagnosis not present

## 2012-11-23 DIAGNOSIS — Z79899 Other long term (current) drug therapy: Secondary | ICD-10-CM | POA: Diagnosis not present

## 2012-11-23 DIAGNOSIS — S22000S Wedge compression fracture of unspecified thoracic vertebra, sequela: Secondary | ICD-10-CM

## 2012-11-23 DIAGNOSIS — C4492 Squamous cell carcinoma of skin, unspecified: Secondary | ICD-10-CM

## 2012-11-23 DIAGNOSIS — R5381 Other malaise: Secondary | ICD-10-CM | POA: Diagnosis not present

## 2012-11-23 DIAGNOSIS — E871 Hypo-osmolality and hyponatremia: Secondary | ICD-10-CM | POA: Diagnosis not present

## 2012-11-23 DIAGNOSIS — A088 Other specified intestinal infections: Secondary | ICD-10-CM | POA: Diagnosis present

## 2012-11-23 DIAGNOSIS — Z853 Personal history of malignant neoplasm of breast: Secondary | ICD-10-CM | POA: Diagnosis not present

## 2012-11-23 DIAGNOSIS — R7301 Impaired fasting glucose: Secondary | ICD-10-CM

## 2012-11-23 DIAGNOSIS — R296 Repeated falls: Secondary | ICD-10-CM | POA: Diagnosis present

## 2012-11-23 DIAGNOSIS — R279 Unspecified lack of coordination: Secondary | ICD-10-CM | POA: Diagnosis not present

## 2012-11-23 DIAGNOSIS — M6530 Trigger finger, unspecified finger: Secondary | ICD-10-CM

## 2012-11-23 DIAGNOSIS — S22000A Wedge compression fracture of unspecified thoracic vertebra, initial encounter for closed fracture: Secondary | ICD-10-CM

## 2012-11-23 DIAGNOSIS — K5900 Constipation, unspecified: Secondary | ICD-10-CM | POA: Diagnosis present

## 2012-11-23 DIAGNOSIS — T361X5A Adverse effect of cephalosporins and other beta-lactam antibiotics, initial encounter: Secondary | ICD-10-CM | POA: Diagnosis present

## 2012-11-23 DIAGNOSIS — H3530 Unspecified macular degeneration: Secondary | ICD-10-CM | POA: Diagnosis present

## 2012-11-23 DIAGNOSIS — Z7982 Long term (current) use of aspirin: Secondary | ICD-10-CM

## 2012-11-23 DIAGNOSIS — K589 Irritable bowel syndrome without diarrhea: Secondary | ICD-10-CM

## 2012-11-23 DIAGNOSIS — A498 Other bacterial infections of unspecified site: Secondary | ICD-10-CM | POA: Diagnosis present

## 2012-11-23 DIAGNOSIS — M545 Low back pain, unspecified: Secondary | ICD-10-CM | POA: Diagnosis not present

## 2012-11-23 DIAGNOSIS — E46 Unspecified protein-calorie malnutrition: Secondary | ICD-10-CM | POA: Diagnosis not present

## 2012-11-23 DIAGNOSIS — Z791 Long term (current) use of non-steroidal anti-inflammatories (NSAID): Secondary | ICD-10-CM | POA: Diagnosis not present

## 2012-11-23 DIAGNOSIS — E869 Volume depletion, unspecified: Secondary | ICD-10-CM | POA: Diagnosis not present

## 2012-11-23 DIAGNOSIS — R102 Pelvic and perineal pain: Secondary | ICD-10-CM

## 2012-11-23 DIAGNOSIS — D500 Iron deficiency anemia secondary to blood loss (chronic): Secondary | ICD-10-CM

## 2012-11-23 DIAGNOSIS — G4700 Insomnia, unspecified: Secondary | ICD-10-CM

## 2012-11-23 DIAGNOSIS — I517 Cardiomegaly: Secondary | ICD-10-CM | POA: Diagnosis not present

## 2012-11-23 DIAGNOSIS — M48062 Spinal stenosis, lumbar region with neurogenic claudication: Secondary | ICD-10-CM

## 2012-11-23 DIAGNOSIS — M6281 Muscle weakness (generalized): Secondary | ICD-10-CM | POA: Diagnosis not present

## 2012-11-23 DIAGNOSIS — IMO0002 Reserved for concepts with insufficient information to code with codable children: Secondary | ICD-10-CM | POA: Diagnosis not present

## 2012-11-23 DIAGNOSIS — N390 Urinary tract infection, site not specified: Secondary | ICD-10-CM | POA: Diagnosis present

## 2012-11-23 DIAGNOSIS — R112 Nausea with vomiting, unspecified: Secondary | ICD-10-CM | POA: Diagnosis present

## 2012-11-23 DIAGNOSIS — S32009A Unspecified fracture of unspecified lumbar vertebra, initial encounter for closed fracture: Secondary | ICD-10-CM | POA: Diagnosis present

## 2012-11-23 DIAGNOSIS — S22009A Unspecified fracture of unspecified thoracic vertebra, initial encounter for closed fracture: Secondary | ICD-10-CM | POA: Diagnosis not present

## 2012-11-23 DIAGNOSIS — H9190 Unspecified hearing loss, unspecified ear: Secondary | ICD-10-CM | POA: Diagnosis present

## 2012-11-23 DIAGNOSIS — R32 Unspecified urinary incontinence: Secondary | ICD-10-CM | POA: Diagnosis present

## 2012-11-23 DIAGNOSIS — J90 Pleural effusion, not elsewhere classified: Secondary | ICD-10-CM | POA: Diagnosis not present

## 2012-11-23 DIAGNOSIS — M810 Age-related osteoporosis without current pathological fracture: Secondary | ICD-10-CM | POA: Diagnosis present

## 2012-11-23 DIAGNOSIS — I2510 Atherosclerotic heart disease of native coronary artery without angina pectoris: Secondary | ICD-10-CM | POA: Diagnosis present

## 2012-11-23 DIAGNOSIS — E236 Other disorders of pituitary gland: Principal | ICD-10-CM | POA: Diagnosis present

## 2012-11-23 DIAGNOSIS — R404 Transient alteration of awareness: Secondary | ICD-10-CM | POA: Diagnosis not present

## 2012-11-23 DIAGNOSIS — M48061 Spinal stenosis, lumbar region without neurogenic claudication: Secondary | ICD-10-CM | POA: Diagnosis not present

## 2012-11-23 DIAGNOSIS — C50919 Malignant neoplasm of unspecified site of unspecified female breast: Secondary | ICD-10-CM

## 2012-11-23 DIAGNOSIS — R197 Diarrhea, unspecified: Secondary | ICD-10-CM | POA: Diagnosis present

## 2012-11-23 DIAGNOSIS — R141 Gas pain: Secondary | ICD-10-CM | POA: Diagnosis not present

## 2012-11-23 DIAGNOSIS — R262 Difficulty in walking, not elsewhere classified: Secondary | ICD-10-CM | POA: Diagnosis not present

## 2012-11-23 DIAGNOSIS — M79609 Pain in unspecified limb: Secondary | ICD-10-CM

## 2012-11-23 LAB — COMPREHENSIVE METABOLIC PANEL
Albumin: 3.2 g/dL — ABNORMAL LOW (ref 3.5–5.2)
BUN: 7 mg/dL (ref 6–23)
Chloride: 71 mEq/L — ABNORMAL LOW (ref 96–112)
Creatinine, Ser: 0.43 mg/dL — ABNORMAL LOW (ref 0.50–1.10)
GFR calc Af Amer: >90 mL/min (ref >90)
Glucose, Bld: 138 mg/dL — ABNORMAL HIGH (ref 70–99)
Total Bilirubin: 0.6 mg/dL (ref 0.3–1.2)

## 2012-11-23 LAB — POCT I-STAT, CHEM 8
Calcium, Ion: 1.06 mmol/L — ABNORMAL LOW (ref 1.13–1.30)
Chloride: 74 mEq/L — ABNORMAL LOW (ref 96–112)
HCT: 34 % — ABNORMAL LOW (ref 36.0–46.0)
Hemoglobin: 11.6 g/dL — ABNORMAL LOW (ref 12.0–15.0)
Potassium: 4.2 mEq/L (ref 3.5–5.1)

## 2012-11-23 LAB — BASIC METABOLIC PANEL
BUN: 6 mg/dL (ref 6–23)
BUN: 7 mg/dL (ref 6–23)
CO2: 20 mEq/L (ref 19–32)
CO2: 22 mEq/L (ref 19–32)
Calcium: 8.3 mg/dL — ABNORMAL LOW (ref 8.4–10.5)
Chloride: 71 mEq/L — ABNORMAL LOW (ref 96–112)
Chloride: 71 mEq/L — ABNORMAL LOW (ref 96–112)
Creatinine, Ser: 0.46 mg/dL — ABNORMAL LOW (ref 0.50–1.10)
Creatinine, Ser: 0.44 mg/dL — ABNORMAL LOW (ref 0.50–1.10)
GFR calc Af Amer: >90 mL/min (ref >90)
GFR calc non Af Amer: 85 mL/min — ABNORMAL LOW (ref >90)
GFR calc non Af Amer: 88 mL/min — ABNORMAL LOW (ref >90)
Glucose, Bld: 162 mg/dL — ABNORMAL HIGH (ref 70–99)
Potassium: 4.1 mEq/L (ref 3.5–5.1)
Potassium: 3.9 mEq/L (ref 3.5–5.1)
Potassium: 4.1 mEq/L (ref 3.5–5.1)
Sodium: 100 mEq/L — CL (ref 135–145)

## 2012-11-23 LAB — CBC WITH DIFFERENTIAL/PLATELET
Basophils Relative: 0 % (ref 0–1)
Eosinophils Absolute: 0 10*3/uL (ref 0.0–0.7)
HCT: 30.1 % — ABNORMAL LOW (ref 36.0–46.0)
Hemoglobin: 11.1 g/dL — ABNORMAL LOW (ref 12.0–15.0)
Lymphs Abs: 0.9 10*3/uL (ref 0.7–4.0)
MCH: 30.4 pg (ref 26.0–34.0)
MCHC: 36.9 g/dL — ABNORMAL HIGH (ref 30.0–36.0)
Monocytes Absolute: 1 10*3/uL (ref 0.1–1.0)
Monocytes Relative: 11 % (ref 3–12)
Neutro Abs: 8 10*3/uL — ABNORMAL HIGH (ref 1.7–7.7)
RBC: 3.65 MIL/uL — ABNORMAL LOW (ref 3.87–5.11)

## 2012-11-23 LAB — URINALYSIS W MICROSCOPIC + REFLEX CULTURE
Bilirubin Urine: NEGATIVE
Glucose, UA: NEGATIVE mg/dL
Protein, ur: 100 mg/dL — AB
pH: 6.5 (ref 5.0–8.0)

## 2012-11-23 LAB — TROPONIN I: Troponin I: <0.3 ng/mL (ref <0.30)

## 2012-11-23 LAB — CREATININE, URINE, RANDOM: Creatinine, Urine: 35.17 mg/dL

## 2012-11-23 LAB — LIPASE, BLOOD: Lipase: 27 U/L (ref 11–59)

## 2012-11-23 LAB — MRSA PCR SCREENING: MRSA by PCR: NEGATIVE

## 2012-11-23 LAB — SODIUM, URINE, RANDOM: Sodium, Ur: 51 mEq/L

## 2012-11-23 LAB — PRO B NATRIURETIC PEPTIDE: Pro B Natriuretic peptide (BNP): 3806 pg/mL — ABNORMAL HIGH (ref 0–450)

## 2012-11-23 MED ORDER — OMEGA-3 FATTY ACIDS 1000 MG PO CAPS: 2.0000 g | ORAL_CAPSULE | Freq: Every day | ORAL | Status: DC

## 2012-11-23 MED ORDER — CHOLECALCIFEROL 10 MCG (400 UNIT) PO TABS
400.0000 [IU] | ORAL_TABLET | Freq: Every day | ORAL | Status: DC
  Administered 2012-11-23 – 2012-11-28 (×6): 400 [IU] via ORAL
  Filled 2012-11-23 (×6): qty 1

## 2012-11-23 MED ORDER — HYDROCODONE-ACETAMINOPHEN 5-325 MG PO TABS
1.0000 | ORAL_TABLET | ORAL | Status: DC | PRN
  Administered 2012-11-23 – 2012-11-25 (×5): 1 via ORAL
  Filled 2012-11-23 (×6): qty 1

## 2012-11-23 MED ORDER — OMEGA-3-ACID ETHYL ESTERS 1 G PO CAPS
1.0000 g | ORAL_CAPSULE | Freq: Every day | ORAL | Status: DC
  Administered 2012-11-23 – 2012-11-28 (×6): 1 g via ORAL
  Filled 2012-11-23 (×6): qty 1

## 2012-11-23 MED ORDER — ONDANSETRON HCL 4 MG PO TABS: 4.0000 mg | ORAL_TABLET | Freq: Four times a day (QID) | ORAL | Status: DC | PRN

## 2012-11-23 MED ORDER — ACETAMINOPHEN 325 MG PO TABS
650.0000 mg | ORAL_TABLET | Freq: Once | ORAL | Status: AC
  Administered 2012-11-23: 650 mg via ORAL
  Filled 2012-11-23: qty 2

## 2012-11-23 MED ORDER — SODIUM CHLORIDE 0.9 % IJ SOLN
3.0000 mL | Freq: Two times a day (BID) | INTRAMUSCULAR | Status: DC
  Administered 2012-11-23 – 2012-11-24 (×3): 3 mL via INTRAVENOUS
  Administered 2012-11-25: 10 mL via INTRAVENOUS
  Administered 2012-11-25: 3 mL via INTRAVENOUS
  Administered 2012-11-26: 10 mL via INTRAVENOUS
  Administered 2012-11-27 (×2): 3 mL via INTRAVENOUS

## 2012-11-23 MED ORDER — SODIUM CHLORIDE 0.9 % IV BOLUS (SEPSIS)
500.0000 mL | Freq: Once | INTRAVENOUS | Status: AC
  Administered 2012-11-23: 500 mL via INTRAVENOUS

## 2012-11-23 MED ORDER — SODIUM CHLORIDE 0.9 % IJ SOLN
10.0000 mL | INTRAMUSCULAR | Status: DC | PRN
  Administered 2012-11-28: 20 mL

## 2012-11-23 MED ORDER — SODIUM CHLORIDE 3 % IV SOLN
INTRAVENOUS | Status: DC
  Administered 2012-11-23: 19:00:00 via INTRAVENOUS
  Filled 2012-11-23: qty 500

## 2012-11-23 MED ORDER — ZOLPIDEM TARTRATE 5 MG PO TABS: 5.0000 mg | ORAL_TABLET | Freq: Every evening | ORAL | Status: DC | PRN

## 2012-11-23 MED ORDER — ONDANSETRON HCL 4 MG/2ML IJ SOLN: 4.0000 mg | Freq: Four times a day (QID) | INTRAMUSCULAR | Status: DC | PRN

## 2012-11-23 MED ORDER — ONDANSETRON HCL 4 MG/2ML IJ SOLN
4.0000 mg | Freq: Once | INTRAMUSCULAR | Status: AC
  Administered 2012-11-23: 4 mg via INTRAVENOUS
  Filled 2012-11-23: qty 2

## 2012-11-23 MED ORDER — ACETAMINOPHEN 325 MG PO TABS
650.0000 mg | ORAL_TABLET | Freq: Four times a day (QID) | ORAL | Status: DC | PRN
Start: 2012-11-23 — End: 2012-11-28

## 2012-11-23 MED ORDER — ACETAMINOPHEN 650 MG RE SUPP: 650.0000 mg | Freq: Four times a day (QID) | RECTAL | Status: DC | PRN

## 2012-11-23 MED ORDER — ASPIRIN 81 MG PO CHEW
81.0000 mg | CHEWABLE_TABLET | Freq: Every day | ORAL | Status: DC
  Administered 2012-11-23 – 2012-11-28 (×6): 81 mg via ORAL
  Filled 2012-11-23 (×6): qty 1

## 2012-11-23 MED ORDER — CEPHALEXIN 250 MG PO CAPS
250.0000 mg | ORAL_CAPSULE | Freq: Four times a day (QID) | ORAL | Status: DC
  Administered 2012-11-23 (×2): 250 mg via ORAL
  Filled 2012-11-23 (×7): qty 1

## 2012-11-23 MED ORDER — ENOXAPARIN SODIUM 40 MG/0.4ML ~~LOC~~ SOLN
40.0000 mg | SUBCUTANEOUS | Status: DC
  Administered 2012-11-23 – 2012-11-27 (×5): 40 mg via SUBCUTANEOUS
  Filled 2012-11-23 (×6): qty 0.4

## 2012-11-23 NOTE — Progress Notes (Signed)
CRITICAL VALUE ALERT  Critical value received:  Na 104  Date of notification:  11/23/2012   Time of notification:  2120  Critical value read back:yes  Nurse who received alert:  Nyeisha Goodall, Ok Edwards   MD notified (1st page):  Consistent with previous values, MD not called  Time of first page:    MD notified (2nd page):  Time of second page:  Responding MD:    Time MD responded:

## 2012-11-23 NOTE — Progress Notes (Signed)
Echocardiogram 2D Echocardiogram has been performed.  Cassandra Glenn 11/23/2012, 4:23 PM

## 2012-11-23 NOTE — Progress Notes (Signed)
CRITICAL VALUE ALERT  Critical value received:  NA101  Date of notification:  11/23/12  Time of notification:  1253  Critical value read back:yes  Nurse who received alert:  CCMyers  MD notified (1st page):  Dr Tat  Time of first page:  1255  MD notified (2nd page):  Time of second page:  Responding MD: D r Tat  Time MD responded: 1259

## 2012-11-23 NOTE — ED Provider Notes (Signed)
CSN: [id number]     Arrival date & time 11/23/12  0910 History   First MD Initiated Contact with Patient 11/23/12 0912     Chief Complaint  Patient presents with  . Emesis  . Diarrhea  . Fatigue   (Consider location/radiation/quality/duration/timing/severity/associated sxs/prior Treatment) Patient is a 79 y.o. female presenting with vomiting and diarrhea.  Emesis Associated symptoms: diarrhea   Associated symptoms: no arthralgias, no chills and no headaches   Diarrhea Associated symptoms: vomiting   Associated symptoms: no arthralgias, no chills, no diaphoresis, no fever and no headaches     Cassandra Glenn is a 79 y.o. women with a pmhx detailed below who presents with a cc of nausea, vomiting, diarrhea. She was in her normal state of health until Nov 5th when she saw her physician with non specific complaints. A UA was done at that visit, and the patients daughter reports that she was called two days ago with the results and started on antibiotics (cephelexin) for a UTI. In between the office visit and starting the abx, the patient had a fall over the weekened ( 4 days ago)/ She was evaluated by her orthopedic specialist who performed a limbar spine x-ray and pelvic xray, which demonstrated no acute fracture. The patient presents today with weakness, AMS, diarrhea, and vomiting. She developed the weakness, diarrhea, and vomiting yesterday, one day after starting cephalexin. The AMS and weakness have been progressive since last week. At baseline, the patient performs her own ADLs and still drives a car. Since last week, she has had trouble taking her medications and trouble with comprehending simple statements to her. Today, she began responding inappropriately to questions. This is associated with leg swelling, color change. Patient denies fever, chest pain. Admits to bladder incontinence.  Past Medical History  Diagnosis Date  . Cancer   . Macular degeneration   . HOH (hard of hearing)    . Vaginitis and vulvovaginitis 02/29/2012  . Abnormalities of the hair 02/29/2012  . Spinal stenosis, lumbar region, with neurogenic claudication 11/02/2011  . Trigger finger (acquired) 11/02/2011  . Right bundle branch block 08/30/2011  . Edema 05/07/2011  . Contact dermatitis and other eczema, due to unspecified cause 05/07/2011  . Hypopotassemia 03/26/2011  . Candidiasis of skin and nails 03/19/2011  . Hyposmolality and/or hyponatremia 03/19/2011  . Acute posthemorrhagic anemia 03/19/2011  . Diverticulosis of colon (without mention of hemorrhage) 03/19/2011  . Unspecified constipation 03/19/2011  . Pain in joint, pelvic region and thigh 03/19/2011  . Lumbago 03/19/2011  . Spasm of muscle 03/19/2011  . Osteoporosis, unspecified 03/19/2011  . Insomnia, unspecified 03/19/2011  . Other malaise and fatigue 03/19/2011  . Impaired fasting glucose 03/19/2011  . Coronary atherosclerosis of native coronary artery 03/18/2011  . Hypotension, unspecified 03/18/2011  . Closed fracture of base of neck of femur 03/18/2011  . Malignant neoplasm of breast (female), unspecified site 10/27/2004   Past Surgical History  Procedure Laterality Date  . Cataract extraction  2010    bialteral  . Hip pinning,cannulated  03/08/2011    Procedure: CANNULATED HIP PINNING;  Surgeon: Javier Docker, MD;  Location: [facility];  Service: Orthopedics;  Laterality: Right;  . Cholecystectomy    . Eye surgery      cataract  . Abdominal hysterectomy      abd  . Fracture surgery Left 09/1980    ankle  . Breast surgery Bilateral 2005-10/27/2004    lumpectomy- Streck,MD  . Squamous cell carcinoma excision Right 02/04/2011  forearmHortense Ramal, MD  . Orif hip fracture Right 03/08/2011    Bean,MD  . Skin lesion excision Right 02/04/2011    Abdomen lesion spongiotic dermatitis-Taffeen, MD  . Skin cancer excision Left 10/12/2012    lower leg Dr. Hortense Ramal   Family History  Problem Relation Age of Onset  . Heart  disease Mother   . Cancer Father   . Emphysema Brother   . Alzheimer's disease Brother    History  Substance Use Topics  . Smoking status: Never Smoker   . Smokeless tobacco: Never Used  . Alcohol Use: 0.0 oz/week    0 Glasses of wine per week     Comment: occasional glass   OB History   Grav Para Term Preterm Abortions TAB SAB Ect Mult Living                 Review of Systems  Constitutional: Positive for activity change, appetite change and fatigue. Negative for fever, chills and diaphoresis.  HENT: Positive for facial swelling. Negative for congestion, rhinorrhea, sinus pressure and sneezing.   Respiratory: Negative for apnea, cough, choking, chest tightness and shortness of breath.   Cardiovascular: Positive for leg swelling. Negative for chest pain and palpitations.  Gastrointestinal: Positive for vomiting and diarrhea.  Endocrine: Negative for polydipsia, polyphagia and polyuria.  Genitourinary: Positive for urgency and frequency. Negative for hematuria and pelvic pain.  Musculoskeletal: Positive for back pain and gait problem. Negative for arthralgias, neck pain and neck stiffness.  Skin: Positive for color change and pallor. Negative for rash and wound.  Neurological: Positive for weakness, light-headedness and numbness. Negative for seizures, syncope, facial asymmetry, speech difficulty and headaches.  Psychiatric/Behavioral: Positive for confusion and decreased concentration. Negative for hallucinations, behavioral problems, dysphoric mood and agitation. The patient is not nervous/anxious and is not hyperactive.     Allergies  Ciprofloxacin; Macrodantin; and Septra  Home Medications   Current Outpatient Rx  Name  Route  Sig  Dispense  Refill  . acetaminophen (TYLENOL) 500 MG tablet   Oral   Take 500 mg by mouth every 6 (six) hours as needed for pain.         Marland Kitchen aspirin 81 MG tablet   Oral   Take 81 mg by mouth daily.         . Bilberry 1000 MG CAPS    Oral   Take 1 capsule by mouth daily.         . Calcium Carbonate-Vitamin D (CALCIUM 600/VITAMIN D PO)   Oral   Take by mouth 2 (two) times daily.         . cephALEXin (KEFLEX) 250 MG capsule   Oral   Take 250 mg by mouth 4 (four) times daily.          . cholecalciferol (VITAMIN D) 400 UNITS TABS   Oral   Take 400 Units by mouth daily. Take 1 tablet daily for vitamin supplement.         Marland Kitchen ESTRACE VAGINAL 0.1 MG/GM vaginal cream      Insert 2 gr vaginally 2-3 times a week at bedtime         . fish oil-omega-3 fatty acids 1000 MG capsule   Oral   Take 2 g by mouth daily. Take 1 capsule daily for vitamin supplement.         . Glucosamine-Vitamin D (GLUCOSAMINE PLUS VITAMIN D) 1500-200 MG-UNIT TABS   Oral   Take 1 tablet by mouth 2 (two) times daily.  Take 1 tablet twice daily for joint health         . ibuprofen (ADVIL,MOTRIN) 200 MG tablet   Oral   Take 400 mg by mouth every 6 (six) hours as needed.         . Lutein 6 MG TABS   Oral   Take 1 tablet by mouth daily.         . Multiple Vitamins-Minerals (PRESERVISION AREDS 2) CAPS   Oral   Take 2 capsules by mouth daily.         . Multiple Vitamins-Minerals (STRESS FORMULA 500/ZINC) TABS   Oral   Take 1 tablet by mouth daily.         . polycarbophil (FIBERCON) 625 MG tablet   Oral   Take 625 mg by mouth daily.         . Probiotic Product (RESTORA) CAPS   Oral   Take 1 capsule by mouth daily.         . traMADol (ULTRAM) 50 MG tablet               . zolpidem (AMBIEN) 5 MG tablet   Oral   Take 5 mg by mouth at bedtime as needed. sleep          BP 175/65  Pulse 72  Temp(Src) 97.8 F (36.6 C) (Oral)  Resp 16  SpO2 95% Physical Exam  Constitutional: She appears well-developed and well-nourished. No distress.  HENT:  Head: Normocephalic.  Mouth/Throat: No oropharyngeal exudate.  Tachy mucous membrane   Eyes: Conjunctivae and EOM are normal. Pupils are equal, round, and reactive  to light.  Cardiovascular: Normal rate, regular rhythm, normal heart sounds and intact distal pulses.  Exam reveals no gallop.   No murmur heard. Pulmonary/Chest: Effort normal and breath sounds normal. No respiratory distress. She has no wheezes. She has no rales.  Abdominal: Soft. Bowel sounds are normal. She exhibits distension. There is tenderness. There is no rebound and no guarding.  Mildly tender in LLQ RLQ and suprapubic area. Distended abdomen, apparently has been this at [facility] a few weeks maybe longer   Musculoskeletal:  2+ LE edema  Neurological: She is alert.  Confused does nto respond appropiratteyl to questions  Skin: Skin is warm and dry. No rash noted. She is not diaphoretic. No erythema.  Yellow hue and pallor  Psychiatric:  Confused    ED Course  Procedures (including critical care time) Labs Review Labs Reviewed  CBC WITH DIFFERENTIAL - Abnormal; Notable for the following:    RBC 3.65 (*)    Hemoglobin 11.1 (*)    HCT 30.1 (*)    MCHC 36.9 (*)    Neutrophils Relative % 81 (*)    Neutro Abs 8.0 (*)    Lymphocytes Relative 9 (*)    All other components within normal limits  COMPREHENSIVE METABOLIC PANEL - Abnormal; Notable for the following:    Sodium 104 (*)    Chloride 71 (*)    Glucose, Bld 138 (*)    Creatinine, Ser 0.43 (*)    Albumin 3.2 (*)    GFR calc non Af Amer 87 (*)    All other components within normal limits  PRO B NATRIURETIC PEPTIDE - Abnormal; Notable for the following:    Pro B Natriuretic peptide (BNP) 3806.0 (*)    All other components within normal limits  POCT I-STAT, CHEM 8 - Abnormal; Notable for the following:    Sodium 104 (*)  Chloride 74 (*)    Glucose, Bld 130 (*)    Calcium, Ion 1.06 (*)    Hemoglobin 11.6 (*)    HCT 34.0 (*)    All other components within normal limits  TROPONIN I  URINALYSIS, ROUTINE W REFLEX MICROSCOPIC  BASIC METABOLIC PANEL   Imaging Review Dg Abd Acute W/chest  11/23/2012   CLINICAL DATA:   Abdominal and back pain.  EXAM: ACUTE ABDOMEN SERIES (ABDOMEN 2 VIEW & CHEST 1 VIEW)  COMPARISON:  Chest radiograph of 03/10/2011. No recent abdominal imaging. A CT of 11/19/2004 is reviewed.  FINDINGS: Frontal view of the chest demonstrates right axillary surgical clips. Cardiomegaly accentuated by AP portable technique. Atherosclerosis in the transverse aorta. Probable small right pleural effusion. No pneumothorax. Moderate interstitial prominence and indistinctness. Patchy right upper and right lower lobe opacities which are suspicious for concurrent airspace disease.  Abdominal films demonstrate no free intraperitoneal air on right-sided decubitus view. Air-fluid level which is likely within the colon. Mild nonspecific paucity of small bowel gas, especially superiorly. Colonic gas identified. No bowel obstruction. Right proximal femoral fixation. Convex right lumbar spine curvature. Suspected T12 compression deformity, suboptimally evaluated. Marland Kitchen  IMPRESSION: Nonspecific paucity of small bowel gas superiorly. No evidence of free intraperitoneal air or specific evidence of obstruction.  Congestive heart failure with probable small right pleural effusion.  Patchy right-sided airspace disease which could represent alveolar edema or concurrent infection. Consider short-term chest radiographic followup to confirm resolution.  Probable T12 compression deformity.   Electronically Signed   By: Jeronimo Greaves M.D.   On: 11/23/2012 10:29    EKG Interpretation     Ventricular Rate:  71 PR Interval:  179 QRS Duration: 134 QT Interval:  438 QTC Calculation: 476 R Axis:   84 Text Interpretation:  Sinus rhythm Right bundle branch block No significant change since last tracing            MDM   1. Hyponatremia     1. Diarrhea, Nausea Vomiting c/b AMS The patient has symptomatic hyponatremia (Na of 104). Consulted IM for admission. IM agreed with this plan and will admit the patient.    Pleas Koch, MD 11/23/12 1209

## 2012-11-23 NOTE — Progress Notes (Signed)
UR completed 

## 2012-11-23 NOTE — H&P (Signed)
[facility] History and Physical  Cassandra Glenn WUJ:[id number] DOB: 02/16/1920 DOA: 11/23/2012   PCP: Kimber Relic, MD   Chief Complaint: N/V/D, generalized weakness  HPI:  79 year old female with a history of breast cancer, spinal stenosis, and mild hyponatremia (130-135) presents with two-day history of nausea, vomiting, diarrhea. Patient had a mechanical fall on 11/16/2012. She went to urgent care on 11/19/2012. X-rays of her pelvis and lumbar spine were negative per daughter's report. 2 days prior to admission, the urgent care contacted the patient and started her on cephalexin for presumptive UTI. The patient has been on cephalexin since that period of time. After starting cephalexin, the patient began experiencing nausea, vomiting, diarrhea. The patient has had approximately 5-6 episodes of vomitus without any blood. She has had 2-3 loose bowel movements without any hematochezia or melena. For the past 2 days, the patient has been confused and less alert. As a result the patient was brought to the emergency department. At baseline, the patient is very independent and continues to drive. [place]  There has not been any reports of fevers, chills, chest discomfort, shortness of breath, dysuria, hematuria, syncope. The patient has had some dizziness for the past 2 days. In addition, she continues to complain of lower back pain with new urinary incontinence. However, she denies any new lower extremity weakness.  In the emergency department, the patient was found to have a  Serum sodium of 104. Her vital signs were stable with 95% oxygen saturation on room air. CBC revealed WBC of 9.9., Hemoglobin 11.1. Potassium was 4.3. Serum creatinine 0.43. Hepatic panel was unremarkable. Acute abdominal series was negative for any free air or obstruction. Chest x-ray revealed mild interstitial prominence with patchy right upper lobe and right lower lobe opacities. There is also  a question of a T12 compression fracture. In the ED, 500 cc normal saline was given to the patient. Assessment/Plan: Hyponatremia -Clinically the patient appears slightly hypervolemic -However, patient likely had sodium loss from her vomiting and diarrhea -start the patient on hypertonic (3%) saline--50cc bolus, then 15cc/hr -As the hypertonic saline will require central line--PICC line will be ordered -Check TSH -Check urinalysis with reflex 2 urine culture -Check urine sodium, urine osmolality, urine creatinine, serum osmolarity -BMP every 2 hours -consulted nephrology Nausea, vomiting, diarrhea -C. difficile PCR -May be related to cephalexin versus viral gastroenteritis -Check lipase Pulmonary infiltrates/opacities -We'll not start additional antibiotics at this time as the patient is afebrile without leukocytosis or tachycardia -Continue to monitor clinically Elevated proBNP  -Chest x-ray suggested interstitial prominence -Patient may ultimately need furosemide to assist with free water excretion as well as peripheral edema -Echocardiogram UTI -Continue cephalexin -Repeat urinalysis with reflex 2 urine culture  Worsening back pain -Likely secondary to mechanical fall -MRI thoracic and lumbar spine -Norco prn pain          Past Medical History  Diagnosis Date  . Cancer   . Macular degeneration   . HOH (hard of hearing)   . Vaginitis and vulvovaginitis 02/29/2012  . Abnormalities of the hair 02/29/2012  . Spinal stenosis, lumbar region, with neurogenic claudication 11/02/2011  . Trigger finger (acquired) 11/02/2011  . Right bundle branch block 08/30/2011  . Edema 05/07/2011  . Contact dermatitis and other eczema, due to unspecified cause 05/07/2011  . Hypopotassemia 03/26/2011  . Candidiasis of skin and nails 03/19/2011  . Hyposmolality and/or hyponatremia 03/19/2011  . Acute posthemorrhagic anemia 03/19/2011  . Diverticulosis of colon (without  mention of  hemorrhage) 03/19/2011  . Unspecified constipation 03/19/2011  . Pain in joint, pelvic region and thigh 03/19/2011  . Lumbago 03/19/2011  . Spasm of muscle 03/19/2011  . Osteoporosis, unspecified 03/19/2011  . Insomnia, unspecified 03/19/2011  . Other malaise and fatigue 03/19/2011  . Impaired fasting glucose 03/19/2011  . Coronary atherosclerosis of native coronary artery 03/18/2011  . Hypotension, unspecified 03/18/2011  . Closed fracture of base of neck of femur 03/18/2011  . Malignant neoplasm of breast (female), unspecified site 10/27/2004   Past Surgical History  Procedure Laterality Date  . Cataract extraction  2010    bialteral  . Hip pinning,cannulated  03/08/2011    Procedure: CANNULATED HIP PINNING;  Surgeon: Javier Docker, MD;  Location: [facility];  Service: Orthopedics;  Laterality: Right;  . Cholecystectomy    . Eye surgery      cataract  . Abdominal hysterectomy      abd  . Fracture surgery Left 09/1980    ankle  . Breast surgery Bilateral 2005-10/27/2004    lumpectomy- Streck,MD  . Squamous cell carcinoma excision Right 02/04/2011    forearm- Taffeen, MD  . Orif hip fracture Right 03/08/2011    Bean,MD  . Skin lesion excision Right 02/04/2011    Abdomen lesion spongiotic dermatitis-Taffeen, MD  . Skin cancer excision Left 10/12/2012    lower leg Dr. Hortense Ramal   Social History:  reports that she has never smoked. She has never used smokeless tobacco. She reports that she drinks alcohol. She reports that she does not use illicit drugs.   Family History  Problem Relation Age of Onset  . Heart disease Mother   . Cancer Father   . Emphysema Brother   . Alzheimer's disease Brother      Allergies  Allergen Reactions  . Ciprofloxacin     unknown  . Macrodantin [Nitrofurantoin Macrocrystal]     unknown  . Septra [Sulfamethoxazole-Trimethoprim]     unknown      Prior to Admission medications   Medication Sig Start Date End Date Taking? Authorizing  Provider  acetaminophen (TYLENOL) 500 MG tablet Take 500 mg by mouth every 6 (six) hours as needed for pain.   Yes Historical Provider, MD  aspirin 81 MG tablet Take 81 mg by mouth daily.   Yes Historical Provider, MD  Bilberry 1000 MG CAPS Take 1 capsule by mouth daily.   Yes Historical Provider, MD  Calcium Carbonate-Vitamin D (CALCIUM 600/VITAMIN D PO) Take by mouth 2 (two) times daily.   Yes Historical Provider, MD  cephALEXin (KEFLEX) 250 MG capsule Take 250 mg by mouth 4 (four) times daily.  11/21/12  Yes Historical Provider, MD  cholecalciferol (VITAMIN D) 400 UNITS TABS Take 400 Units by mouth daily. Take 1 tablet daily for vitamin supplement.   Yes Historical Provider, MD  ESTRACE VAGINAL 0.1 MG/GM vaginal cream Insert 2 gr vaginally 2-3 times a week at bedtime 06/15/12  Yes Historical Provider, MD  fish oil-omega-3 fatty acids 1000 MG capsule Take 2 g by mouth daily. Take 1 capsule daily for vitamin supplement.   Yes Historical Provider, MD  Glucosamine-Vitamin D (GLUCOSAMINE PLUS VITAMIN D) 1500-200 MG-UNIT TABS Take 1 tablet by mouth 2 (two) times daily. Take 1 tablet twice daily for joint health   Yes Historical Provider, MD  ibuprofen (ADVIL,MOTRIN) 200 MG tablet Take 400 mg by mouth every 6 (six) hours as needed.   Yes Historical Provider, MD  Lutein 6 MG TABS Take 1 tablet by mouth  daily.   Yes Historical Provider, MD  Multiple Vitamins-Minerals (PRESERVISION AREDS 2) CAPS Take 2 capsules by mouth daily.   Yes Historical Provider, MD  Multiple Vitamins-Minerals (STRESS FORMULA 500/ZINC) TABS Take 1 tablet by mouth daily.   Yes Historical Provider, MD  polycarbophil (FIBERCON) 625 MG tablet Take 625 mg by mouth daily.   Yes Historical Provider, MD  Probiotic Product (RESTORA) CAPS Take 1 capsule by mouth daily.   Yes Historical Provider, MD  traMADol Janean Sark) 50 MG tablet  11/19/12  Yes Historical Provider, MD  zolpidem (AMBIEN) 5 MG tablet Take 5 mg by mouth at bedtime as needed. sleep    Yes Historical Provider, MD    Review of Systems:  Constitutional:  No weight loss, night sweats,  fatigue.  Head&Eyes: No headache.  No vision loss.  No eye pain or scotoma ENT:  No Difficulty swallowing,Tooth/dental problems,Sore throat,   Cardio-vascular:  No chest pain, Orthopnea, PND, swelling in lower extremities, palpitations  GI:  No  abdominal pain, nausea, vomiting, diarrhea, loss of appetite, hematochezia, melena, heartburn, indigestion, Resp:  No shortness of breath with exertion or at rest. No cough. No coughing up of blood . Skin:  no rash or lesions.  GU:  no dysuria, change in color of urine, no urgency or frequency. No flank pain.  Musculoskeletal:  No joint pain or swelling. No decreased range of motion. Psych:  No change in mood or affect. No depression or anxiety. Neurologic: No headache, no dysesthesia, no focal weakness, no vision loss. No syncope  Physical Exam: Filed Vitals:   11/23/12 0912 11/23/12 0917  BP:  175/65  Pulse:  72  Temp:  97.8 F (36.6 C)  TempSrc:  Oral  Resp:  16  SpO2: 96% 95%   General:  Alert and awake, NAD, nontoxic, pleasant/cooperative Head/Eye: No conjunctival hemorrhage, no icterus, [place]/AT, No nystagmus ENT:  No icterus,  No thrush, good dentition, no pharyngeal exudate Neck:  No masses, no lymphadenpathy, no bruits CV:  RRR, no rub, no gallop, no S3 Lung:  Scattered fine bibasilar rales without any wheezing. Good air movement. Abdomen: soft/NT, +BS, nondistended, no peritoneal signs Ext: No cyanosis, No rashes, No petechiae, No lymphangitis, No edema Neuro: CNII-XII intact, strength 4-/5 in bilateral upper and lower extremities, no dysmetria; protopathic and epicritic sensation is intact bilaterally. No difficulty with FNF  Labs on Admission:  Basic Metabolic Panel:  Recent Labs Lab 11/23/12 0935 11/23/12 1121  NA 104* 104*  K 4.3 4.2  CL 71* 74*  CO2 22  --   GLUCOSE 138* 130*  BUN 7 6  CREATININE 0.43*  0.60  CALCIUM 8.6  --    Liver Function Tests:  Recent Labs Lab 11/23/12 0935  AST 28  ALT 20  ALKPHOS 97  BILITOT 0.6  PROT 6.3  ALBUMIN 3.2*   No results found for this basename: LIPASE, AMYLASE,  in the last 168 hours No results found for this basename: AMMONIA,  in the last 168 hours CBC:  Recent Labs Lab 11/23/12 0935 11/23/12 1121  WBC 9.9  --   NEUTROABS 8.0*  --   HGB 11.1* 11.6*  HCT 30.1* 34.0*  MCV 82.5  --   PLT 262  --    Cardiac Enzymes:  Recent Labs Lab 11/23/12 0935  TROPONINI <0.30   BNP: No components found with this basename: POCBNP,  CBG: No results found for this basename: GLUCAP,  in the last 168 hours  Radiological Exams on Admission: Dg  Abd Acute W/chest  11/23/2012   CLINICAL DATA:  Abdominal and back pain.  EXAM: ACUTE ABDOMEN SERIES (ABDOMEN 2 VIEW & CHEST 1 VIEW)  COMPARISON:  Chest radiograph of 03/10/2011. No recent abdominal imaging. A CT of 11/19/2004 is reviewed.  FINDINGS: Frontal view of the chest demonstrates right axillary surgical clips. Cardiomegaly accentuated by AP portable technique. Atherosclerosis in the transverse aorta. Probable small right pleural effusion. No pneumothorax. Moderate interstitial prominence and indistinctness. Patchy right upper and right lower lobe opacities which are suspicious for concurrent airspace disease.  Abdominal films demonstrate no free intraperitoneal air on right-sided decubitus view. Air-fluid level which is likely within the colon. Mild nonspecific paucity of small bowel gas, especially superiorly. Colonic gas identified. No bowel obstruction. Right proximal femoral fixation. Convex right lumbar spine curvature. Suspected T12 compression deformity, suboptimally evaluated. Marland Kitchen  IMPRESSION: Nonspecific paucity of small bowel gas superiorly. No evidence of free intraperitoneal air or specific evidence of obstruction.  Congestive heart failure with probable small right pleural effusion.  Patchy  right-sided airspace disease which could represent alveolar edema or concurrent infection. Consider short-term chest radiographic followup to confirm resolution.  Probable T12 compression deformity.   Electronically Signed   By: Jeronimo Greaves M.D.   On: 11/23/2012 10:29    EKG: Independently reviewed. Sinus, RBBB    Time spent:70 minutes Code Status:   FULL Family Communication:   daughter at bedside   Adebayo Ensminger, DO  [facility] Pager [phone]  If 7PM-7AM, please contact night-coverage [email or web address] Password [facility] 11/23/2012, 12:24 PM

## 2012-11-23 NOTE — Progress Notes (Signed)
Report received from ED. Awaiting patients transfer to [id number]. J.Dayton Kenley, RN

## 2012-11-23 NOTE — Progress Notes (Signed)
Peripherally Inserted Central Catheter/Midline Placement  The IV Nurse has discussed with the patient and/or persons authorized to consent for the patient, the purpose of this procedure and the potential benefits and risks involved with this procedure.  The benefits include less needle sticks, lab draws from the catheter and patient may be discharged home with the catheter.  Risks include, but not limited to, infection, bleeding, blood clot (thrombus formation), and puncture of an artery; nerve damage and irregular heat beat.  Alternatives to this procedure were also discussed.  PICC/Midline Placement Documentation        Cassandra Glenn 11/23/2012, 4:43 PM

## 2012-11-23 NOTE — Consult Note (Signed)
Referring Provider: No ref. provider found Primary Care Physician:  Kimber Relic, MD Primary Nephrologist:    Reason for Consultation:  hyponatremia HPI: 79 year old female with a history of breast cancer, spinal stenosis, and mild hyponatremia (130-135) presents with two-day history of nausea, vomiting, diarrhea.Serum sodium of 104. Her vital signs were stable with 95% oxygen saturation on room air. No thiazide diuretics but NSAID use. Urine studies in process    Past Medical History  Diagnosis Date  . Cancer   . Macular degeneration   . HOH (hard of hearing)   . Vaginitis and vulvovaginitis 02/29/2012  . Abnormalities of the hair 02/29/2012  . Spinal stenosis, lumbar region, with neurogenic claudication 11/02/2011  . Trigger finger (acquired) 11/02/2011  . Right bundle branch block 08/30/2011  . Edema 05/07/2011  . Contact dermatitis and other eczema, due to unspecified cause 05/07/2011  . Hypopotassemia 03/26/2011  . Candidiasis of skin and nails 03/19/2011  . Hyposmolality and/or hyponatremia 03/19/2011  . Acute posthemorrhagic anemia 03/19/2011  . Diverticulosis of colon (without mention of hemorrhage) 03/19/2011  . Unspecified constipation 03/19/2011  . Pain in joint, pelvic region and thigh 03/19/2011  . Lumbago 03/19/2011  . Spasm of muscle 03/19/2011  . Osteoporosis, unspecified 03/19/2011  . Insomnia, unspecified 03/19/2011  . Other malaise and fatigue 03/19/2011  . Impaired fasting glucose 03/19/2011  . Coronary atherosclerosis of native coronary artery 03/18/2011  . Hypotension, unspecified 03/18/2011  . Closed fracture of base of neck of femur 03/18/2011  . Malignant neoplasm of breast (female), unspecified site 10/27/2004    Past Surgical History  Procedure Laterality Date  . Cataract extraction  2010    bialteral  . Hip pinning,cannulated  03/08/2011    Procedure: CANNULATED HIP PINNING;  Surgeon: Javier Docker, MD;  Location: [facility];  Service:  Orthopedics;  Laterality: Right;  . Cholecystectomy    . Eye surgery      cataract  . Abdominal hysterectomy      abd  . Fracture surgery Left 09/1980    ankle  . Breast surgery Bilateral 2005-10/27/2004    lumpectomy- Streck,MD  . Squamous cell carcinoma excision Right 02/04/2011    forearm- Taffeen, MD  . Orif hip fracture Right 03/08/2011    Bean,MD  . Skin lesion excision Right 02/04/2011    Abdomen lesion spongiotic dermatitis-Taffeen, MD  . Skin cancer excision Left 10/12/2012    lower leg Dr. Hortense Ramal    Prior to Admission medications   Medication Sig Start Date End Date Taking? Authorizing Provider  acetaminophen (TYLENOL) 500 MG tablet Take 500 mg by mouth every 6 (six) hours as needed for pain.   Yes Historical Provider, MD  aspirin 81 MG tablet Take 81 mg by mouth daily.   Yes Historical Provider, MD  Bilberry 1000 MG CAPS Take 1 capsule by mouth daily.   Yes Historical Provider, MD  Calcium Carbonate-Vitamin D (CALCIUM 600/VITAMIN D PO) Take by mouth 2 (two) times daily.   Yes Historical Provider, MD  cephALEXin (KEFLEX) 250 MG capsule Take 250 mg by mouth 4 (four) times daily.  11/21/12  Yes Historical Provider, MD  cholecalciferol (VITAMIN D) 400 UNITS TABS Take 400 Units by mouth daily. Take 1 tablet daily for vitamin supplement.   Yes Historical Provider, MD  ESTRACE VAGINAL 0.1 MG/GM vaginal cream Insert 2 gr vaginally 2-3 times a week at bedtime 06/15/12  Yes Historical Provider, MD  fish oil-omega-3 fatty acids 1000 MG capsule Take 2 g by mouth daily. Take  1 capsule daily for vitamin supplement.   Yes Historical Provider, MD  Glucosamine-Vitamin D (GLUCOSAMINE PLUS VITAMIN D) 1500-200 MG-UNIT TABS Take 1 tablet by mouth 2 (two) times daily. Take 1 tablet twice daily for joint health   Yes Historical Provider, MD  ibuprofen (ADVIL,MOTRIN) 200 MG tablet Take 400 mg by mouth every 6 (six) hours as needed.   Yes Historical Provider, MD  Lutein 6 MG TABS Take 1 tablet by  mouth daily.   Yes Historical Provider, MD  Multiple Vitamins-Minerals (PRESERVISION AREDS 2) CAPS Take 2 capsules by mouth daily.   Yes Historical Provider, MD  Multiple Vitamins-Minerals (STRESS FORMULA 500/ZINC) TABS Take 1 tablet by mouth daily.   Yes Historical Provider, MD  polycarbophil (FIBERCON) 625 MG tablet Take 625 mg by mouth daily.   Yes Historical Provider, MD  Probiotic Product (RESTORA) CAPS Take 1 capsule by mouth daily.   Yes Historical Provider, MD  traMADol Janean Sark) 50 MG tablet  11/19/12  Yes Historical Provider, MD  zolpidem (AMBIEN) 5 MG tablet Take 5 mg by mouth at bedtime as needed. sleep   Yes Historical Provider, MD    Current Facility-Administered Medications  Medication Dose Route Frequency Provider Last Rate Last Dose  . acetaminophen (TYLENOL) tablet 650 mg  650 mg Oral Q6H PRN Catarina Hartshorn, MD       Or  . acetaminophen (TYLENOL) suppository 650 mg  650 mg Rectal Q6H PRN Catarina Hartshorn, MD      . aspirin chewable tablet 81 mg  81 mg Oral Daily Catarina Hartshorn, MD   81 mg at 11/23/12 1451  . cephALEXin (KEFLEX) capsule 250 mg  250 mg Oral QID Catarina Hartshorn, MD   250 mg at 11/23/12 1451  . cholecalciferol (VITAMIN D) tablet 400 Units  400 Units Oral Daily Catarina Hartshorn, MD   400 Units at 11/23/12 1451  . enoxaparin (LOVENOX) injection 40 mg  40 mg Subcutaneous Q24H Catarina Hartshorn, MD      . HYDROcodone-acetaminophen (NORCO/VICODIN) 5-325 MG per tablet 1-2 tablet  1-2 tablet Oral Q4H PRN Catarina Hartshorn, MD      . omega-3 acid ethyl esters (LOVAZA) capsule 1 g  1 g Oral Daily Catarina Hartshorn, MD   1 g at 11/23/12 1451  . ondansetron (ZOFRAN) tablet 4 mg  4 mg Oral Q6H PRN Catarina Hartshorn, MD       Or  . ondansetron [facility]) injection 4 mg  4 mg Intravenous Q6H PRN Catarina Hartshorn, MD      . sodium chloride (hypertonic) 3 % solution   Intravenous Continuous David Tat, MD      . sodium chloride 0.9 % injection 3 mL  3 mL Intravenous Q12H Catarina Hartshorn, MD   3 mL at 11/23/12 1452  . zolpidem (AMBIEN) tablet 5 mg  5 mg  Oral QHS PRN Catarina Hartshorn, MD        Allergies as of 11/23/2012 - Review Complete 11/23/2012  Allergen Reaction Noted  . Ciprofloxacin  08/30/2011  . Macrodantin [nitrofurantoin macrocrystal]  08/30/2011  . Septra [sulfamethoxazole-trimethoprim]  08/30/2011    Family History  Problem Relation Age of Onset  . Heart disease Mother   . Cancer Father   . Emphysema Brother   . Alzheimer's disease Brother     History   Social History  . Marital Status: Married    Spouse Name: N/A    Number of Children: N/A  . Years of Education: N/A   Occupational History  . Not on  file.   Social History Main Topics  . Smoking status: Never Smoker   . Smokeless tobacco: Never Used  . Alcohol Use: 0.0 oz/week    0 Glasses of wine per week     Comment: occasional glass  . Drug Use: No  . Sexual Activity: No   Other Topics Concern  . Not on file   Social History Narrative  . No narrative on file    Review of Systems: Gen: Denies any fever, chills, sweats, anorexia, fatigue, weakness, malaise, weight loss, and sleep disorder HEENT: No visual complaints, No history of Retinopathy. Normal external appearance No Epistaxis or Sore throat. No sinusitis.   CV: Denies chest pain, angina, palpitations, syncope, orthopnea, PND, peripheral edema, and claudication. Resp: Denies dyspnea at rest, dyspnea with exercise, cough, sputum, wheezing, coughing up blood, and pleurisy. GI: Denies vomiting blood, jaundice, and fecal incontinence.   Denies dysphagia or odynophagia. GU : Denies urinary burning, blood in urine, urinary frequency, urinary hesitancy, nocturnal urination, and urinary incontinence.  No renal calculi. MS: Denies joint pain, limitation of movement, and swelling, stiffness, low back pain, extremity pain. Denies muscle weakness, cramps, atrophy.  No use of non steroidal antiinflammatory drugs. Derm: Denies rash, itching, dry skin, hives, moles, warts, or unhealing ulcers.  Psych: Denies  depression, anxiety, memory loss, suicidal ideation, hallucinations, paranoia, and confusion. Heme: Denies bruising, bleeding, and enlarged lymph nodes. Neuro: No headache.  No diplopia. No dysarthria.  No dysphasia.  No history of CVA.  No Seizures. No paresthesias.  No weakness. Endocrine No DM.  No Thyroid disease.  No Adrenal disease.  Physical Exam: Vital signs in last 24 hours: Temp:  [97.8 F (36.6 C)-98 F (36.7 C)] 98 F (36.7 C) (11/13 1305) Pulse Rate:  [72] 72 (11/13 1305) Resp:  [16-18] 18 (11/13 1305) BP: (140-175)/(59-65) 140/59 mmHg (11/13 1305) SpO2:  [95 %-96 %] 96 % (11/13 1305) Last BM Date: 11/22/12 General:  Elderly lady in no distress Head:  Normocephalic and atraumatic. Eyes:  Sclera clear, no icterus.   Conjunctiva pink. Ears:  Normal auditory acuity. Nose:  No deformity, discharge,  or lesions. Mouth:  No deformity or lesions, dentition normal. Neck:  Supple; no masses or thyromegaly. JVP not elevated Lungs:  Clear throughout to auscultation.   No wheezes, crackles, or rhonchi. No acute distress. Heart:  Regular rate and rhythm; no murmurs, clicks, rubs,  or gallops. Abdomen:  Soft, nontender and nondistended. No masses, hepatosplenomegaly or hernias noted. Normal bowel sounds, without guarding, and without rebound.   Msk:  Symmetrical without gross deformities. Normal posture. Pulses:  No carotid, renal, femoral bruits. DP and PT symmetrical and equal Extremities:  Without clubbing or edema. Neurologic:  Alert and  oriented x4;  grossly normal neurologically. Skin:  Intact without significant lesions or rashes. Cervical Nodes:  No significant cervical adenopathy. Psych:  Alert and cooperative. Normal mood and affect.  Intake/Output from previous day:   Intake/Output this shift:    Lab Results:  Recent Labs  11/23/12 0935 11/23/12 1121  WBC 9.9  --   HGB 11.1* 11.6*  HCT 30.1* 34.0*  PLT 262  --    BMET  Recent Labs  11/23/12 0935  11/23/12 1110 11/23/12 1121  NA 104* 101* 104*  K 4.3 4.1 4.2  CL 71* 71* 74*  CO2 22 19  --   GLUCOSE 138* 128* 130*  BUN 7 7 6   CREATININE 0.43* 0.46* 0.60  CALCIUM 8.6 8.5  --  LFT  Recent Labs  11/23/12 0935  PROT 6.3  ALBUMIN 3.2*  AST 28  ALT 20  ALKPHOS 97  BILITOT 0.6   PT/INR No results found for this basename: LABPROT, INR,  in the last 72 hours Hepatitis Panel No results found for this basename: HEPBSAG, HCVAB, HEPAIGM, HEPBIGM,  in the last 72 hours  Studies/Results: Dg Abd Acute W/chest  11/23/2012   CLINICAL DATA:  Abdominal and back pain.  EXAM: ACUTE ABDOMEN SERIES (ABDOMEN 2 VIEW & CHEST 1 VIEW)  COMPARISON:  Chest radiograph of 03/10/2011. No recent abdominal imaging. A CT of 11/19/2004 is reviewed.  FINDINGS: Frontal view of the chest demonstrates right axillary surgical clips. Cardiomegaly accentuated by AP portable technique. Atherosclerosis in the transverse aorta. Probable small right pleural effusion. No pneumothorax. Moderate interstitial prominence and indistinctness. Patchy right upper and right lower lobe opacities which are suspicious for concurrent airspace disease.  Abdominal films demonstrate no free intraperitoneal air on right-sided decubitus view. Air-fluid level which is likely within the colon. Mild nonspecific paucity of small bowel gas, especially superiorly. Colonic gas identified. No bowel obstruction. Right proximal femoral fixation. Convex right lumbar spine curvature. Suspected T12 compression deformity, suboptimally evaluated. Marland Kitchen  IMPRESSION: Nonspecific paucity of small bowel gas superiorly. No evidence of free intraperitoneal air or specific evidence of obstruction.  Congestive heart failure with probable small right pleural effusion.  Patchy right-sided airspace disease which could represent alveolar edema or concurrent infection. Consider short-term chest radiographic followup to confirm resolution.  Probable T12 compression deformity.    Electronically Signed   By: Jeronimo Greaves M.D.   On: 11/23/2012 10:29    Assessment/Plan:  Hyponatremia in elderly lady with problably poor solute intake and SIADH.awaiting results of urine studies including urine sodium and urine osmolarity. Mild volume overload and may benefit from lasix will continue the 3 % saline for now. Will continue to monitor serum sodium and avoid any increase greater than 12 mmol/l in 24 hours to prevent osmotic demyelination.   LOS: 0 Mozelle Remlinger W @TODAY @3 :17 PM

## 2012-11-23 NOTE — Progress Notes (Signed)
CRITICAL VALUE ALERT  Critical value received:  Sodium 100    Date of notification:  11/23/12  Time of notification:  0615  Critical value read back:yes  Nurse who received alert:  Darien Ramus, RN  MD notified (1st page):  Tat   Time of first page:  1818  MD notified (2nd page):  Time of second page:  Responding MD:  Tat  Time MD responded:  1818

## 2012-11-23 NOTE — ED Notes (Signed)
Bed: ZD66 Expected date:  Expected time:  Means of arrival:  Comments: EMS-UTI-N/V/D

## 2012-11-23 NOTE — ED Notes (Signed)
Pt from [facility]. Pt seen here week ago for fall, was dx with UTI, started on keflex. Started to have n/v/d 2 days later through today. Pt states she has not been able to keep anything down for past 3 days. Also complains of weakness and back pain.

## 2012-11-24 DIAGNOSIS — S22000A Wedge compression fracture of unspecified thoracic vertebra, initial encounter for closed fracture: Secondary | ICD-10-CM

## 2012-11-24 DIAGNOSIS — M48062 Spinal stenosis, lumbar region with neurogenic claudication: Secondary | ICD-10-CM

## 2012-11-24 DIAGNOSIS — IMO0002 Reserved for concepts with insufficient information to code with codable children: Secondary | ICD-10-CM

## 2012-11-24 HISTORY — DX: Wedge compression fracture of unspecified thoracic vertebra, initial encounter for closed fracture: S22.000A

## 2012-11-24 LAB — BASIC METABOLIC PANEL
BUN: 6 mg/dL (ref 6–23)
BUN: 6 mg/dL (ref 6–23)
BUN: 6 mg/dL (ref 6–23)
BUN: 6 mg/dL (ref 6–23)
BUN: 5 mg/dL — ABNORMAL LOW (ref 6–23)
BUN: 5 mg/dL — ABNORMAL LOW (ref 6–23)
BUN: 8 mg/dL (ref 6–23)
BUN: 8 mg/dL (ref 6–23)
BUN: 9 mg/dL (ref 6–23)
BUN: 10 mg/dL (ref 6–23)
CO2: 22 mEq/L (ref 19–32)
CO2: 23 mEq/L (ref 19–32)
CO2: 24 mEq/L (ref 19–32)
CO2: 24 mEq/L (ref 19–32)
CO2: 23 mEq/L (ref 19–32)
Calcium: 8.1 mg/dL — ABNORMAL LOW (ref 8.4–10.5)
Calcium: 8.2 mg/dL — ABNORMAL LOW (ref 8.4–10.5)
Calcium: 8.9 mg/dL (ref 8.4–10.5)
Calcium: 8.7 mg/dL (ref 8.4–10.5)
Calcium: 8.6 mg/dL (ref 8.4–10.5)
Calcium: 8.1 mg/dL — ABNORMAL LOW (ref 8.4–10.5)
Calcium: 8.2 mg/dL — ABNORMAL LOW (ref 8.4–10.5)
Calcium: 8.2 mg/dL — ABNORMAL LOW (ref 8.4–10.5)
Calcium: 8.1 mg/dL — ABNORMAL LOW (ref 8.4–10.5)
Calcium: 8.1 mg/dL — ABNORMAL LOW (ref 8.4–10.5)
Chloride: 78 mEq/L — ABNORMAL LOW (ref 96–112)
Chloride: 80 mEq/L — ABNORMAL LOW (ref 96–112)
Chloride: 85 mEq/L — ABNORMAL LOW (ref 96–112)
Chloride: 89 mEq/L — ABNORMAL LOW (ref 96–112)
Chloride: 85 mEq/L — ABNORMAL LOW (ref 96–112)
Chloride: 88 mEq/L — ABNORMAL LOW (ref 96–112)
Chloride: 91 mEq/L — ABNORMAL LOW (ref 96–112)
Creatinine, Ser: 0.42 mg/dL — ABNORMAL LOW (ref 0.50–1.10)
Creatinine, Ser: 0.42 mg/dL — ABNORMAL LOW (ref 0.50–1.10)
Creatinine, Ser: 0.43 mg/dL — ABNORMAL LOW (ref 0.50–1.10)
Creatinine, Ser: 0.5 mg/dL (ref 0.50–1.10)
Creatinine, Ser: 0.47 mg/dL — ABNORMAL LOW (ref 0.50–1.10)
Creatinine, Ser: 0.47 mg/dL — ABNORMAL LOW (ref 0.50–1.10)
Creatinine, Ser: 0.49 mg/dL — ABNORMAL LOW (ref 0.50–1.10)
Creatinine, Ser: 0.46 mg/dL — ABNORMAL LOW (ref 0.50–1.10)
Creatinine, Ser: 0.49 mg/dL — ABNORMAL LOW (ref 0.50–1.10)
Creatinine, Ser: 0.48 mg/dL — ABNORMAL LOW (ref 0.50–1.10)
Creatinine, Ser: 0.55 mg/dL (ref 0.50–1.10)
GFR calc Af Amer: >90 mL/min (ref >90)
GFR calc Af Amer: >90 mL/min (ref >90)
GFR calc Af Amer: >90 mL/min (ref >90)
GFR calc Af Amer: >90 mL/min (ref >90)
GFR calc Af Amer: >90 mL/min (ref >90)
GFR calc Af Amer: >90 mL/min (ref >90)
GFR calc Af Amer: >90 mL/min (ref >90)
GFR calc Af Amer: >90 mL/min (ref >90)
GFR calc Af Amer: >90 mL/min (ref >90)
GFR calc non Af Amer: 87 mL/min — ABNORMAL LOW (ref >90)
GFR calc non Af Amer: 83 mL/min — ABNORMAL LOW (ref >90)
GFR calc non Af Amer: 84 mL/min — ABNORMAL LOW (ref >90)
GFR calc non Af Amer: 83 mL/min — ABNORMAL LOW (ref >90)
GFR calc non Af Amer: 85 mL/min — ABNORMAL LOW (ref >90)
GFR calc non Af Amer: 83 mL/min — ABNORMAL LOW (ref >90)
GFR calc non Af Amer: 84 mL/min — ABNORMAL LOW (ref >90)
GFR calc non Af Amer: 81 mL/min — ABNORMAL LOW (ref >90)
GFR calc non Af Amer: 78 mL/min — ABNORMAL LOW (ref >90)
GFR calc non Af Amer: 80 mL/min — ABNORMAL LOW (ref >90)
Glucose, Bld: 113 mg/dL — ABNORMAL HIGH (ref 70–99)
Glucose, Bld: 137 mg/dL — ABNORMAL HIGH (ref 70–99)
Glucose, Bld: 106 mg/dL — ABNORMAL HIGH (ref 70–99)
Glucose, Bld: 125 mg/dL — ABNORMAL HIGH (ref 70–99)
Glucose, Bld: 165 mg/dL — ABNORMAL HIGH (ref 70–99)
Glucose, Bld: 145 mg/dL — ABNORMAL HIGH (ref 70–99)
Glucose, Bld: 165 mg/dL — ABNORMAL HIGH (ref 70–99)
Glucose, Bld: 236 mg/dL — ABNORMAL HIGH (ref 70–99)
Potassium: 3.1 mEq/L — ABNORMAL LOW (ref 3.5–5.1)
Potassium: 3.2 mEq/L — ABNORMAL LOW (ref 3.5–5.1)
Sodium: 118 mEq/L — CL (ref 135–145)
Sodium: 120 mEq/L — ABNORMAL LOW (ref 135–145)
Sodium: 116 mEq/L — CL (ref 135–145)
Sodium: 119 mEq/L — CL (ref 135–145)
Sodium: 119 mEq/L — CL (ref 135–145)
Sodium: 122 mEq/L — ABNORMAL LOW (ref 135–145)

## 2012-11-24 LAB — CBC
HCT: 29.9 % — ABNORMAL LOW (ref 36.0–46.0)
Hemoglobin: 11 g/dL — ABNORMAL LOW (ref 12.0–15.0)
MCH: 30.6 pg (ref 26.0–34.0)
MCHC: 36.8 g/dL — ABNORMAL HIGH (ref 30.0–36.0)
RBC: 3.6 MIL/uL — ABNORMAL LOW (ref 3.87–5.11)

## 2012-11-24 LAB — OSMOLALITY, URINE: Osmolality, Ur: 95 mOsm/kg — ABNORMAL LOW (ref 390–1090)

## 2012-11-24 LAB — URIC ACID: Uric Acid, Serum: 2.4 mg/dL (ref 2.4–7.0)

## 2012-11-24 LAB — OSMOLALITY: Osmolality: 215 mOsm/kg — CL (ref 275–300)

## 2012-11-24 LAB — TSH: TSH: 0.911 u[IU]/mL (ref 0.350–4.500)

## 2012-11-24 MED ORDER — DEXTROSE 5 % IV SOLN
INTRAVENOUS | Status: DC
  Administered 2012-11-24: 04:00:00 via INTRAVENOUS

## 2012-11-24 MED ORDER — DEXTROSE 5 % IV SOLN
1.0000 g | INTRAVENOUS | Status: DC
  Administered 2012-11-24 – 2012-11-28 (×5): 1 g via INTRAVENOUS
  Filled 2012-11-24 (×5): qty 10

## 2012-11-24 MED ORDER — DEXTROSE 5 % IV SOLN
INTRAVENOUS | Status: DC
  Administered 2012-11-24 (×4): via INTRAVENOUS
  Administered 2012-11-25: 1000 mL via INTRAVENOUS
  Administered 2012-11-25 (×2): via INTRAVENOUS
  Administered 2012-11-25: 1000 mL via INTRAVENOUS
  Administered 2012-11-25 – 2012-11-26 (×4): via INTRAVENOUS
  Administered 2012-11-26: 50 mL via INTRAVENOUS
  Administered 2012-11-27: 12:00:00 via INTRAVENOUS

## 2012-11-24 NOTE — Progress Notes (Signed)
PT Cancellation Note  Patient Details Name: Cassandra Glenn MRN: [id number] DOB: Jan 27, 1920   Cancelled Treatment:    Reason Eval/Treat Not Completed: Medical issues which prohibited therapy Per [facility] staff, pt moved to ICU due to closer monitoring for electrolyte imbalances.  Will check back as schedule permits.   Maressa Apollo,KATHrine E 11/24/2012, 1:57 PM

## 2012-11-24 NOTE — Progress Notes (Signed)
[facility] PROGRESS NOTE  FEVEN ALDERFER XBJ:[id number] DOB: [birth year] DOA: 11/23/2012 PCP: Kimber Relic, MD  Assessment/Plan: Hypotonic Hyponatremia  -Clinically the patient appears euvolemic to slightly hypervolemic  -However, patient likely had sodium loss from her vomiting and diarrhea  -start the patient on hypertonic (3%) saline--50cc bolus, then 15cc/hr  -Pt had rapid correction with short duration of hypertonic saline -appreciate nephrology--target Na 115-118 next 24 hours -Started on D5W -Check TSH 0.911  -uric acid 2.4 (low) -Check urinalysis with reflex 2 urine culture  -urine osmolality299, serum osmolarity 215 -FeNa <1% -BMP every 2 hours  -discussed case with Dr. Marisue Humble  Nausea, vomiting, diarrhea  -resolved -C. difficile PCR  -May be related to cephalexin versus viral gastroenteritis  Pyuria -empiric ceftriaxone pending culture -d/c cephalexin Pulmonary infiltrates/opacities  -Will not start additional antibiotics at this time as the patient is afebrile without leukocytosis or tachycardia  -Continue to monitor clinically  Elevated proBNP  -Chest x-ray suggested interstitial prominence  -Patient may ultimately need furosemide to assist with free water excretion as well as peripheral edema  -Echocardiogram EF 60-65%, grade 1 diastolic dysfunction, mild to moderate TR  Worsening back pain  -Likely secondary to mechanical fall  -MRI thoracic and lumbar spine--T-11 acute/subacute compression fracture -discussed with neurosurgery, Dr. Danie Chandler emergent need for neurosurgical intervention as patient has no leg weakness  -Norco prn pain   Family Communication:   daughter at beside Disposition Plan:   Home when medically stable   Antibiotics:  Ceftriaxone 11/24/2012>>>    Procedures/Studies: Mr Thoracic Spine Wo Contrast  11/24/2012   CLINICAL DATA:  Back pain with leg weakness.  Abnormal x-ray.  EXAM: MRI THORACIC AND LUMBAR SPINE WITHOUT  CONTRAST  TECHNIQUE: Multiplanar and multiecho pulse sequences of the thoracic and lumbar spine were obtained without intravenous contrast.  COMPARISON:  11/23/2012 plain film exam.  No comparison MR.  FINDINGS: MR THORACIC SPINE FINDINGS  T11 acute/subacute compression fracture with 50% loss of height. Retropulsion of the posterior superior aspect of the compressed vertebra greater to left of midline with mild flattening of the cord greater to left of midline. The appearance is suggestive of a benign osteoporotic compression fracture given the patient's history of fall. Prior to cement augmentation, CT imaging can be performed to define the full extent of the fracture which appears to extend into the left pedicle.  No other acute thoracic spine compression fractures noted.  1.6 cm lesion within the T3 vertebral body. Centrally this appears sclerotic with peripheral fatty component. This would be unusual for metastatic lesion but does not have typical characteristics of a simple hemangioma. If the patient developed symptoms preferable to this region, followup imaging with contrast and attention to this region may be considered.  C6-7: Bulge/ protrusion with mild spinal stenosis with minimal cord contact incompletely assessed on the present exam.  T1-2: Minimal bulge greater right paracentral position.  T2-3: Minimal anterior slip T3.  Minimal bulge.  T3-4: Minimal bulge.  T6-7:  Shallow right protrusion.  Minimal right-sided cord contact.  T8-9: Shallow right posterior lateral protrusion without cord contact.  T9-10:  Small central protrusion with minimal if any cord contact.  T11-12: Mild left facet joint degenerative changes.  T12-L1: Broad-based disc osteophyte greater to left. Mild facet joint degenerative changes greater on left. Mild to slightly moderate left-sided and mild right-sided spinal stenosis.  Bilateral pleural effusions greater on the right.  Cardiomegaly.  MR LUMBAR SPINE FINDINGS  Curvature of the  lumbar spine. Superimposed degenerative changes  including:  L1-2: Broad-based disc osteophyte complex greater to left. Facet joint degenerative changes and ligamentum flavum hypertrophy. Mild to moderate left-sided and mild right-sided spinal stenosis/ lateral recess stenosis.  L2-3: Broad-based disc osteophyte complex greater left lateral position with slight encroachment upon but not significant compression of the exiting left L2 nerve root. Facet joint degenerative changes and ligamentum flavum hypertrophy greater on left. Moderate to marked left-sided and moderate right-sided spinal stenosis/lateral recess stenosis.  L3-4: Broad-based disc osteophyte complex. Facet joint degenerative changes and ligamentum flavum hypertrophy greater on the right. Moderate to marked right-sided and mild to moderate left-sided lateral recess stenosis. Moderate spinal stenosis with a trefoil appearance and greater on the right.  L4-5: Prominent facet joint degenerative changes without clear pars defect on the left. Right-sided pars defect not excluded. 8 mm anterior slippage of L4. Bulge. Ligamentum flavum hypertrophy. Multifactorial severe spinal stenosis and moderate to marked right-sided with mild moderate left-sided foraminal narrowing and encroachment upon the exiting L4 nerve roots greater on the right.  L5-S1. Moderate facet joint degenerative changes. Minimal anterior slip L5. Bulge/ protrusion greatest centrally and to left. Left greater than right lateral recess stenosis. Mild spinal stenosis in a right to left direction. Mild bilateral foraminal narrowing.  IMPRESSION: MR THORACIC SPINE IMPRESSION  T11 acute/subacute compression fracture with 50% loss of height. Retropulsion of the posterior superior aspect of the compressed vertebra greater to left of midline with mild flattening of the cord greater to left of midline. The appearance is suggestive of a benign osteoporotic compression fracture given the patient's history  of fall. Prior to cement augmentation, CT imaging can be performed to define the full extent of the fracture which appears to extend into the left pedicle.  Nonspecific T3 lesion as detailed above.  Scattered degenerative changes lower cervical and thoracic spine as detailed above.  Bilateral pleural effusions greater on the right.  MR LUMBAR SPINE IMPRESSION  Scoliosis and degenerative changes with various degrees of spinal stenosis and foraminal narrowing as detailed above with most notable finding the severe multifactorial spinal stenosis at the L4-5 level as detailed above.  No acute lumbar spine or upper sacral fracture.  These results will be called to the ordering clinician or representative by the Radiologist Assistant, and communication documented in the PACS Dashboard.   Electronically Signed   By: Bridgett Larsson M.D.   On: 11/24/2012 08:15   Mr Lumbar Spine Wo Contrast  11/24/2012   CLINICAL DATA:  Back pain with leg weakness.  Abnormal x-ray.  EXAM: MRI THORACIC AND LUMBAR SPINE WITHOUT CONTRAST  TECHNIQUE: Multiplanar and multiecho pulse sequences of the thoracic and lumbar spine were obtained without intravenous contrast.  COMPARISON:  11/23/2012 plain film exam.  No comparison MR.  FINDINGS: MR THORACIC SPINE FINDINGS  T11 acute/subacute compression fracture with 50% loss of height. Retropulsion of the posterior superior aspect of the compressed vertebra greater to left of midline with mild flattening of the cord greater to left of midline. The appearance is suggestive of a benign osteoporotic compression fracture given the patient's history of fall. Prior to cement augmentation, CT imaging can be performed to define the full extent of the fracture which appears to extend into the left pedicle.  No other acute thoracic spine compression fractures noted.  1.6 cm lesion within the T3 vertebral body. Centrally this appears sclerotic with peripheral fatty component. This would be unusual for metastatic  lesion but does not have typical characteristics of a simple hemangioma. If the patient  developed symptoms preferable to this region, followup imaging with contrast and attention to this region may be considered.  C6-7: Bulge/ protrusion with mild spinal stenosis with minimal cord contact incompletely assessed on the present exam.  T1-2: Minimal bulge greater right paracentral position.  T2-3: Minimal anterior slip T3.  Minimal bulge.  T3-4: Minimal bulge.  T6-7:  Shallow right protrusion.  Minimal right-sided cord contact.  T8-9: Shallow right posterior lateral protrusion without cord contact.  T9-10:  Small central protrusion with minimal if any cord contact.  T11-12: Mild left facet joint degenerative changes.  T12-L1: Broad-based disc osteophyte greater to left. Mild facet joint degenerative changes greater on left. Mild to slightly moderate left-sided and mild right-sided spinal stenosis.  Bilateral pleural effusions greater on the right.  Cardiomegaly.  MR LUMBAR SPINE FINDINGS  Curvature of the lumbar spine. Superimposed degenerative changes including:  L1-2: Broad-based disc osteophyte complex greater to left. Facet joint degenerative changes and ligamentum flavum hypertrophy. Mild to moderate left-sided and mild right-sided spinal stenosis/ lateral recess stenosis.  L2-3: Broad-based disc osteophyte complex greater left lateral position with slight encroachment upon but not significant compression of the exiting left L2 nerve root. Facet joint degenerative changes and ligamentum flavum hypertrophy greater on left. Moderate to marked left-sided and moderate right-sided spinal stenosis/lateral recess stenosis.  L3-4: Broad-based disc osteophyte complex. Facet joint degenerative changes and ligamentum flavum hypertrophy greater on the right. Moderate to marked right-sided and mild to moderate left-sided lateral recess stenosis. Moderate spinal stenosis with a trefoil appearance and greater on the right.  L4-5:  Prominent facet joint degenerative changes without clear pars defect on the left. Right-sided pars defect not excluded. 8 mm anterior slippage of L4. Bulge. Ligamentum flavum hypertrophy. Multifactorial severe spinal stenosis and moderate to marked right-sided with mild moderate left-sided foraminal narrowing and encroachment upon the exiting L4 nerve roots greater on the right.  L5-S1. Moderate facet joint degenerative changes. Minimal anterior slip L5. Bulge/ protrusion greatest centrally and to left. Left greater than right lateral recess stenosis. Mild spinal stenosis in a right to left direction. Mild bilateral foraminal narrowing.  IMPRESSION: MR THORACIC SPINE IMPRESSION  T11 acute/subacute compression fracture with 50% loss of height. Retropulsion of the posterior superior aspect of the compressed vertebra greater to left of midline with mild flattening of the cord greater to left of midline. The appearance is suggestive of a benign osteoporotic compression fracture given the patient's history of fall. Prior to cement augmentation, CT imaging can be performed to define the full extent of the fracture which appears to extend into the left pedicle.  Nonspecific T3 lesion as detailed above.  Scattered degenerative changes lower cervical and thoracic spine as detailed above.  Bilateral pleural effusions greater on the right.  MR LUMBAR SPINE IMPRESSION  Scoliosis and degenerative changes with various degrees of spinal stenosis and foraminal narrowing as detailed above with most notable finding the severe multifactorial spinal stenosis at the L4-5 level as detailed above.  No acute lumbar spine or upper sacral fracture.  These results will be called to the ordering clinician or representative by the Radiologist Assistant, and communication documented in the PACS Dashboard.   Electronically Signed   By: Bridgett Larsson M.D.   On: 11/24/2012 08:15   Dg Abd Acute W/chest  11/23/2012   CLINICAL DATA:  Abdominal and  back pain.  EXAM: ACUTE ABDOMEN SERIES (ABDOMEN 2 VIEW & CHEST 1 VIEW)  COMPARISON:  Chest radiograph of 03/10/2011. No recent abdominal imaging. A  CT of 11/19/2004 is reviewed.  FINDINGS: Frontal view of the chest demonstrates right axillary surgical clips. Cardiomegaly accentuated by AP portable technique. Atherosclerosis in the transverse aorta. Probable small right pleural effusion. No pneumothorax. Moderate interstitial prominence and indistinctness. Patchy right upper and right lower lobe opacities which are suspicious for concurrent airspace disease.  Abdominal films demonstrate no free intraperitoneal air on right-sided decubitus view. Air-fluid level which is likely within the colon. Mild nonspecific paucity of small bowel gas, especially superiorly. Colonic gas identified. No bowel obstruction. Right proximal femoral fixation. Convex right lumbar spine curvature. Suspected T12 compression deformity, suboptimally evaluated. Marland Kitchen  IMPRESSION: Nonspecific paucity of small bowel gas superiorly. No evidence of free intraperitoneal air or specific evidence of obstruction.  Congestive heart failure with probable small right pleural effusion.  Patchy right-sided airspace disease which could represent alveolar edema or concurrent infection. Consider short-term chest radiographic followup to confirm resolution.  Probable T12 compression deformity.   Electronically Signed   By: Jeronimo Greaves M.D.   On: 11/23/2012 10:29         Subjective: Patient is much more lucid this morning. She denies any headache, visual disturbance, chest pain, shortness breath, nausea, vomiting, diarrhea, abdominal pain.  Objective: Filed Vitals:   11/23/12 2156 11/24/12 0535 11/24/12 1240 11/24/12 1323  BP: 157/62 139/54 111/56 150/79  Pulse: 64 70 68 77  Temp: 97.5 F (36.4 C) 97.5 F (36.4 C) 98.2 F (36.8 C)   TempSrc: Oral Oral Oral   Resp: 18 16 18    Height:      Weight:      SpO2: 95% 98% 97% 97%    Intake/Output  Summary (Last 24 hours) at 11/24/12 1446 Last data filed at 11/24/12 1100  Gross per 24 hour  Intake   1057 ml  Output   6350 ml  Net  -5293 ml   Weight change:  Exam:   General:  Pt is alert, follows commands appropriately, not in acute distress  HEENT: No icterus, No thrush,  [employer]/AT  Cardiovascular: RRR, S1/S2, no rubs, no gallops  Respiratory: Fine bibasilar crackles. No wheezing. Good air movement.  Abdomen: Soft/+BS, non tender, non distended, no guarding  Extremities: 1+LE edema, No lymphangitis, No petechiae, No rashes, no synovitis  Data Reviewed: Basic Metabolic Panel:  Recent Labs Lab 11/24/12 0410 11/24/12 0623 11/24/12 0800 11/24/12 0955 11/24/12 1235  NA 119* 122* 118* 120* 116*  K 3.2* 3.4* 3.2* 3.4* 3.4*  CL 88* 89* 86* 87* 85*  CO2 23 26 24 23 24   GLUCOSE 125* 155* 165* 145* 165*  BUN 6 5* 5* 6 8  CREATININE 0.50 0.47* 0.47* 0.49* 0.46*  CALCIUM 9.0 8.9 8.7 8.6 8.1*   Liver Function Tests:  Recent Labs Lab 11/23/12 0935  AST 28  ALT 20  ALKPHOS 97  BILITOT 0.6  PROT 6.3  ALBUMIN 3.2*    Recent Labs Lab 11/23/12 1705  LIPASE 27   No results found for this basename: AMMONIA,  in the last 168 hours CBC:  Recent Labs Lab 11/23/12 0935 11/23/12 1121 11/24/12 0410  WBC 9.9  --  5.9  NEUTROABS 8.0*  --   --   HGB 11.1* 11.6* 11.0*  HCT 30.1* 34.0* 29.9*  MCV 82.5  --  83.1  PLT 262  --  254   Cardiac Enzymes:  Recent Labs Lab 11/23/12 0935  TROPONINI <0.30   BNP: No components found with this basename: POCBNP,  CBG: No results found for this basename:  GLUCAP,  in the last 168 hours  Recent Results (from the past 240 hour(s))  MRSA PCR SCREENING     Status: None   Collection Time    11/23/12  9:29 PM      Result Value Range Status   MRSA by PCR NEGATIVE  NEGATIVE Final   Comment:            The GeneXpert MRSA Assay (FDA     approved for NASAL specimens     only), is one component of a     comprehensive MRSA  colonization     surveillance program. It is not     intended to diagnose MRSA     infection nor to guide or     monitor treatment for     MRSA infections.     Scheduled Meds: . aspirin  81 mg Oral Daily  . cefTRIAXone (ROCEPHIN)  IV  1 g Intravenous Q24H  . cholecalciferol  400 Units Oral Daily  . enoxaparin (LOVENOX) injection  40 mg Subcutaneous Q24H  . omega-3 acid ethyl esters  1 g Oral Daily  . sodium chloride  3 mL Intravenous Q12H   Continuous Infusions: . dextrose 250 mL/hr at 11/24/12 1356     Mung Rinker, DO  [facility] Pager [phone]  If 7PM-7AM, please contact night-coverage [email or web address] Password [email or web address] 11/24/2012, 2:46 PM   LOS: 1 day

## 2012-11-24 NOTE — Progress Notes (Signed)
Report called to [place] [facility]. Pt to transfer to room 1230.

## 2012-11-24 NOTE — Progress Notes (Signed)
Admit: 11/23/2012 LOS: 1  27F admit with SNa 104, suspected SIADH vs hypovolemia, recent fall and ? Vertebral copression fracture, UTI Rx keflex.    Subjective:  Pt placed on 3% saline overnight.   Na rapidly corrected.  HT discontinued and placed on D5W at 223mL/hr  Pt hungry this AM. Denies N/V, confusion, weakness, numbness.    Pt has chronic mild diarrhea, was no worse precedign admission.  Pt was eating regularly, perhaps somewhat less than usual before admission.  Some N but no emesis. Pt not on diuretics.  Pt not on psychiatric medications.  Pt used tramadol only twice.  Pt urinating normally and denies excessive thirst before admission.  Drank water regularly with meals and pills.  Pt w/o orthopnea, PND, DOE.  Denies any LEE to speak of.  Large UOP since admission.    Sodium (mEq/L)  Date Value  11/24/2012 118*  11/24/2012 122*  11/24/2012 119*  11/24/2012 117*  11/24/2012 111*  11/23/2012 108*  11/23/2012 104*  11/23/2012 100*  11/23/2012 104*  11/23/2012 101*   Uric Acid 2.4. U Osms 299 11/13.   U Na 51 S Osms 215  11/13 0701 - 11/14 0700 In: 577 [I.V.:577] Out: 5350 [Urine:5350]  Filed Weights   11/23/12 1700  Weight: 62 kg (136 lb 11 oz)    Current meds: reviewed  Current Labs: reviewed    Physical Exam:  Blood pressure 139/54, pulse 70, temperature 97.5 F (36.4 C), temperature source Oral, resp. rate 16, height 5\' 2"  (1.575 m), weight 62 kg (136 lb 11 oz), SpO2 98.00%. NAD, appears well.  Conversant and appropriate RRR CTAB with some coarse bs b/l. Nl wob.  Lying at 20deg in bed S/nt/nd, nabs aao x3,  Mae x4 No LEE No rashes/lesions EOMI  Assessment/Plan 1. Hypotonic euvolemic hyponatremia: I think this was SIADH related to the recent fall, pain, and perhaps the UTI.  She is not grossly hypervolemic.  Her story has little to suggest profound volume depletion but this could havecontributed.  Her BPs upon arrival were normal or elevated.  Her low  uric acid supports SIADH as well.   No evidence of an endocrine disorder.  No clear medication culprits.   She has rapidly corrected with 3% saline since admission and was placed on D5W with a decrease in SNa.  Neurologically she is completely intact currently.  The real risk is in overcorrection and I think she should cont on D5W with a target SNa for the next 18h of 115 mEq/L.  After that we should aim for an increase in SNa of 87mEq/L / 24h.  If she begins to increase again despite D5W I would give desmopressin IV to create a fixed concentration defect in the kidney.  She needs daily U Osms, strict I/Os, q4h neuro checks, q2h serum Na, and daily orthostatics.  No need for further 3% saline.    Sabra Heck MD 11/24/2012, 10:37 AM   Recent Labs Lab 11/24/12 0410 11/24/12 0623 11/24/12 0800  NA 119* 122* 118*  K 3.2* 3.4* 3.2*  CL 88* 89* 86*  CO2 23 26 24   GLUCOSE 125* 155* 165*  BUN 6 5* 5*  CREATININE 0.50 0.47* 0.47*  CALCIUM 9.0 8.9 8.7    Recent Labs Lab 11/23/12 0935 11/23/12 1121 11/24/12 0410  WBC 9.9  --  5.9  NEUTROABS 8.0*  --   --   HGB 11.1* 11.6* 11.0*  HCT 30.1* 34.0* 29.9*  MCV 82.5  --  83.1  PLT  262  --  254    Current Facility-Administered Medications  Medication Dose Route Frequency Provider Last Rate Last Dose  . acetaminophen (TYLENOL) tablet 650 mg  650 mg Oral Q6H PRN Catarina Hartshorn, MD       Or  . acetaminophen (TYLENOL) suppository 650 mg  650 mg Rectal Q6H PRN Catarina Hartshorn, MD      . aspirin chewable tablet 81 mg  81 mg Oral Daily Catarina Hartshorn, MD   81 mg at 11/24/12 1024  . cephALEXin (KEFLEX) capsule 250 mg  250 mg Oral QID Catarina Hartshorn, MD   250 mg at 11/23/12 2130  . cholecalciferol (VITAMIN D) tablet 400 Units  400 Units Oral Daily Catarina Hartshorn, MD   400 Units at 11/24/12 1024  . dextrose 5 % solution   Intravenous Continuous Garnetta Buddy, MD 250 mL/hr at 11/24/12 [phone]    . enoxaparin (LOVENOX) injection 40 mg  40 mg Subcutaneous Q24H Catarina Hartshorn, MD   40 mg  at 11/23/12 2130  . HYDROcodone-acetaminophen (NORCO/VICODIN) 5-325 MG per tablet 1-2 tablet  1-2 tablet Oral Q4H PRN Catarina Hartshorn, MD   1 tablet at 11/24/12 1034  . omega-3 acid ethyl esters (LOVAZA) capsule 1 g  1 g Oral Daily Catarina Hartshorn, MD   1 g at 11/24/12 1024  . ondansetron (ZOFRAN) tablet 4 mg  4 mg Oral Q6H PRN Catarina Hartshorn, MD       Or  . ondansetron [facility]) injection 4 mg  4 mg Intravenous Q6H PRN Catarina Hartshorn, MD      . sodium chloride 0.9 % injection 10-40 mL  10-40 mL Intracatheter PRN Onalee Hua Tat, MD      . sodium chloride 0.9 % injection 3 mL  3 mL Intravenous Q12H Catarina Hartshorn, MD   3 mL at 11/23/12 2130  . zolpidem (AMBIEN) tablet 5 mg  5 mg Oral QHS PRN Catarina Hartshorn, MD

## 2012-11-24 NOTE — Progress Notes (Signed)
Pt transferred on tele to ICU /SD at 1320

## 2012-11-25 DIAGNOSIS — N390 Urinary tract infection, site not specified: Secondary | ICD-10-CM

## 2012-11-25 LAB — BASIC METABOLIC PANEL
BUN: 4 mg/dL — ABNORMAL LOW (ref 6–23)
BUN: 5 mg/dL — ABNORMAL LOW (ref 6–23)
BUN: 7 mg/dL (ref 6–23)
BUN: 9 mg/dL (ref 6–23)
CO2: 23 mEq/L (ref 19–32)
CO2: 24 mEq/L (ref 19–32)
CO2: 26 mEq/L (ref 19–32)
CO2: 26 mEq/L (ref 19–32)
CO2: 27 mEq/L (ref 19–32)
Calcium: 7.8 mg/dL — ABNORMAL LOW (ref 8.4–10.5)
Calcium: 7.9 mg/dL — ABNORMAL LOW (ref 8.4–10.5)
Calcium: 8 mg/dL — ABNORMAL LOW (ref 8.4–10.5)
Calcium: 8.3 mg/dL — ABNORMAL LOW (ref 8.4–10.5)
Chloride: 88 mEq/L — ABNORMAL LOW (ref 96–112)
Chloride: 89 mEq/L — ABNORMAL LOW (ref 96–112)
Chloride: 88 mEq/L — ABNORMAL LOW (ref 96–112)
Chloride: 88 mEq/L — ABNORMAL LOW (ref 96–112)
Creatinine, Ser: 0.45 mg/dL — ABNORMAL LOW (ref 0.50–1.10)
Creatinine, Ser: 0.46 mg/dL — ABNORMAL LOW (ref 0.50–1.10)
Creatinine, Ser: 0.52 mg/dL (ref 0.50–1.10)
GFR calc Af Amer: 87 mL/min — ABNORMAL LOW (ref >90)
GFR calc Af Amer: >90 mL/min (ref >90)
GFR calc Af Amer: >90 mL/min (ref >90)
GFR calc Af Amer: >90 mL/min (ref >90)
GFR calc Af Amer: >90 mL/min (ref >90)
GFR calc Af Amer: >90 mL/min (ref >90)
GFR calc Af Amer: >90 mL/min (ref >90)
GFR calc non Af Amer: 84 mL/min — ABNORMAL LOW (ref >90)
GFR calc non Af Amer: 85 mL/min — ABNORMAL LOW (ref >90)
GFR calc non Af Amer: 85 mL/min — ABNORMAL LOW (ref >90)
GFR calc non Af Amer: 78 mL/min — ABNORMAL LOW (ref >90)
GFR calc non Af Amer: 75 mL/min — ABNORMAL LOW (ref >90)
Glucose, Bld: 171 mg/dL — ABNORMAL HIGH (ref 70–99)
Glucose, Bld: 138 mg/dL — ABNORMAL HIGH (ref 70–99)
Glucose, Bld: 169 mg/dL — ABNORMAL HIGH (ref 70–99)
Potassium: 3 mEq/L — ABNORMAL LOW (ref 3.5–5.1)
Potassium: 3.1 mEq/L — ABNORMAL LOW (ref 3.5–5.1)
Potassium: 3.1 mEq/L — ABNORMAL LOW (ref 3.5–5.1)
Potassium: 3.2 mEq/L — ABNORMAL LOW (ref 3.5–5.1)
Potassium: 3.2 mEq/L — ABNORMAL LOW (ref 3.5–5.1)
Potassium: 3.3 mEq/L — ABNORMAL LOW (ref 3.5–5.1)
Potassium: 3.4 mEq/L — ABNORMAL LOW (ref 3.5–5.1)
Sodium: 120 mEq/L — ABNORMAL LOW (ref 135–145)
Sodium: 121 mEq/L — ABNORMAL LOW (ref 135–145)
Sodium: 122 mEq/L — ABNORMAL LOW (ref 135–145)
Sodium: 122 mEq/L — ABNORMAL LOW (ref 135–145)
Sodium: 122 mEq/L — ABNORMAL LOW (ref 135–145)
Sodium: 122 mEq/L — ABNORMAL LOW (ref 135–145)
Sodium: 123 mEq/L — ABNORMAL LOW (ref 135–145)

## 2012-11-25 LAB — OSMOLALITY, URINE: Osmolality, Ur: 54 mOsm/kg — ABNORMAL LOW (ref 390–1090)

## 2012-11-25 LAB — URINE CULTURE: Culture: >=100000

## 2012-11-25 NOTE — Progress Notes (Addendum)
Admit: 11/23/2012 LOS: 2  33F admit with SNa 104, suspected SIADH vs hypovolemia, recent fall and ? Vertebral copression fracture, UTI Rx keflex.    Subjective:  Na has been stable in upper 110s / low 120s.  Pt w/o AMS, weakness, vision changes.  Has good appetite, eating well.  No SOB or CP.    Sodium (mEq/L)  Date Value  11/25/2012 122*  11/25/2012 122*  11/25/2012 123*  11/25/2012 122*  11/25/2012 120*  11/24/2012 122*  11/24/2012 119*  11/24/2012 119*  11/24/2012 119*  11/24/2012 121*   Uric Acid 2.4. U Osms 299 11/13.  --> 95 U Na 51 S [address] 215  11/14 0701 - 11/15 0700 In: 5195 [P.O.:480; I.V.:4715] Out: 6975 [Urine:6975]  Filed Weights   11/23/12 1700  Weight: 62 kg (136 lb 11 oz)    Current meds: reviewed  Current Labs: reviewed    Physical Exam:  Blood pressure 121/39, pulse 65, temperature 97.9 F (36.6 C), temperature source Oral, resp. rate 20, height 5\' 2"  (1.575 m), weight 62 kg (136 lb 11 oz), SpO2 96.00%. NAD, appears well.  Conversant and appropriate RRR CTAB with some coarse bs b/l. Nl wob.  Lying at 20deg in bed S/nt/nd, nabs aao x3,  Mae x4 No LEE No rashes/lesions EOMI  Assessment/Plan 1. Hypotonic euvolemic hyponatremia: Pt Na has been stable over past 12 on D5W @ 300.  Goal in next 24h is to have Na around 130.  Dec D5W to 200 hr.  Can do BMP q4h now.  I still think this was likely SIADH and the rapid correction was the correction of the excessive ADH levels.  No mental status or neurological issues at this time thankfully.  She should drink when thirsty but not excessively and I see no need for dietary Na restriction.    Sabra Heck MD 11/25/2012, 11:53 AM   Recent Labs Lab 11/25/12 0630 11/25/12 0854 11/25/12 1025  NA 123* 122* 122*  K 3.2* 3.2* 3.2*  CL 91* 88* 89*  CO2 23 26 26   GLUCOSE 175* 151* 148*  BUN 6 5* 4*  CREATININE 0.46* 0.46* 0.45*  CALCIUM 8.0* 8.1* 8.1*    Recent Labs Lab 11/23/12 0935 11/23/12 1121  11/24/12 0410  WBC 9.9  --  5.9  NEUTROABS 8.0*  --   --   HGB 11.1* 11.6* 11.0*  HCT 30.1* 34.0* 29.9*  MCV 82.5  --  83.1  PLT 262  --  254    Current Facility-Administered Medications  Medication Dose Route Frequency Provider Last Rate Last Dose  . acetaminophen (TYLENOL) tablet 650 mg  650 mg Oral Q6H PRN Catarina Hartshorn, MD       Or  . acetaminophen (TYLENOL) suppository 650 mg  650 mg Rectal Q6H PRN Catarina Hartshorn, MD      . aspirin chewable tablet 81 mg  81 mg Oral Daily Catarina Hartshorn, MD   81 mg at 11/25/12 1015  . cefTRIAXone (ROCEPHIN) 1 g in dextrose 5 % 50 mL IVPB  1 g Intravenous Q24H Catarina Hartshorn, MD   1 g at 11/24/12 1152  . cholecalciferol (VITAMIN D) tablet 400 Units  400 Units Oral Daily Catarina Hartshorn, MD   400 Units at 11/25/12 1015  . dextrose 5 % solution   Intravenous Continuous Arita Miss, MD 300 mL/hr at 11/25/12 1016 1,000 mL at 11/25/12 1016  . enoxaparin (LOVENOX) injection 40 mg  40 mg Subcutaneous Q24H Catarina Hartshorn, MD   40 mg at  11/24/12 2223  . HYDROcodone-acetaminophen (NORCO/VICODIN) 5-325 MG per tablet 1-2 tablet  1-2 tablet Oral Q4H PRN Catarina Hartshorn, MD   1 tablet at 11/25/12 0756  . omega-3 acid ethyl esters (LOVAZA) capsule 1 g  1 g Oral Daily Catarina Hartshorn, MD   1 g at 11/25/12 1015  . ondansetron (ZOFRAN) tablet 4 mg  4 mg Oral Q6H PRN Catarina Hartshorn, MD       Or  . ondansetron [facility]) injection 4 mg  4 mg Intravenous Q6H PRN Onalee Hua Tat, MD      . sodium chloride 0.9 % injection 10-40 mL  10-40 mL Intracatheter PRN Onalee Hua Tat, MD      . sodium chloride 0.9 % injection 3 mL  3 mL Intravenous Q12H Onalee Hua Tat, MD   10 mL at 11/25/12 0830  . zolpidem (AMBIEN) tablet 5 mg  5 mg Oral QHS PRN Catarina Hartshorn, MD

## 2012-11-25 NOTE — Progress Notes (Signed)
[facility] PROGRESS NOTE  ROSELINE EBARB ZOX:[id number] DOB: 29-Nov-1920 DOA: 11/23/2012 PCP: Kimber Relic, MD  Assessment/Plan: Hypotonic Hyponatremia  -Clinically the patient appears euvolemic -However, patient likely had sodium loss from her vomiting and diarrhea  -Initially started the patient on hypertonic (3%) saline--50cc bolus, then 15cc/hr  -Pt had rapid correction with short duration of hypertonic saline  -appreciate nephrology -Continue D5W  -Check TSH 0.911  -uric acid 2.4 (low)  -urine osmolality299, serum osmolarity 215  -FeNa <1%  -continue BMP every 2 hours  -discussed case with Dr. Delilah Shan D5W as Na stabilizing  Nausea, vomiting, diarrhea  -resolved  -C. difficile PCR--cancel as pt has not had any stools since admission  -May be related to cephalexin versus viral gastroenteritis  UTI--Ecoli -empiric ceftriaxone pending culture  -d/c cephalexin  Pulmonary infiltrates/opacities  -Will not start additional antibiotics at this time as the patient is afebrile without leukocytosis or tachycardia  -Continue to monitor clinically  Elevated proBNP  -Chest x-ray suggested interstitial prominence  -Patient may ultimately need furosemide to assist with free water excretion as well as peripheral edema  -Echocardiogram EF 60-65%, grade 1 diastolic dysfunction, mild to moderate TR  Worsening back pain  -Likely secondary to mechanical fall  -MRI thoracic and lumbar spine--T-11 acute/subacute compression fracture  -discussed with neurosurgery, Dr. Danie Chandler emergent need for neurosurgical intervention as patient has no leg weakness  -Norco prn pain  Family Communication: daughter at beside  Disposition Plan: Home when medically stable  Antibiotics:  Ceftriaxone 11/24/2012>>>          Procedures/Studies: Mr Thoracic Spine Wo Contrast  11/24/2012   CLINICAL DATA:  Back pain with leg weakness.  Abnormal x-ray.  EXAM: MRI THORACIC AND LUMBAR  SPINE WITHOUT CONTRAST  TECHNIQUE: Multiplanar and multiecho pulse sequences of the thoracic and lumbar spine were obtained without intravenous contrast.  COMPARISON:  11/23/2012 plain film exam.  No comparison MR.  FINDINGS: MR THORACIC SPINE FINDINGS  T11 acute/subacute compression fracture with 50% loss of height. Retropulsion of the posterior superior aspect of the compressed vertebra greater to left of midline with mild flattening of the cord greater to left of midline. The appearance is suggestive of a benign osteoporotic compression fracture given the patient's history of fall. Prior to cement augmentation, CT imaging can be performed to define the full extent of the fracture which appears to extend into the left pedicle.  No other acute thoracic spine compression fractures noted.  1.6 cm lesion within the T3 vertebral body. Centrally this appears sclerotic with peripheral fatty component. This would be unusual for metastatic lesion but does not have typical characteristics of a simple hemangioma. If the patient developed symptoms preferable to this region, followup imaging with contrast and attention to this region may be considered.  C6-7: Bulge/ protrusion with mild spinal stenosis with minimal cord contact incompletely assessed on the present exam.  T1-2: Minimal bulge greater right paracentral position.  T2-3: Minimal anterior slip T3.  Minimal bulge.  T3-4: Minimal bulge.  T6-7:  Shallow right protrusion.  Minimal right-sided cord contact.  T8-9: Shallow right posterior lateral protrusion without cord contact.  T9-10:  Small central protrusion with minimal if any cord contact.  T11-12: Mild left facet joint degenerative changes.  T12-L1: Broad-based disc osteophyte greater to left. Mild facet joint degenerative changes greater on left. Mild to slightly moderate left-sided and mild right-sided spinal stenosis.  Bilateral pleural effusions greater on the right.  Cardiomegaly.  MR LUMBAR SPINE FINDINGS   Curvature  of the lumbar spine. Superimposed degenerative changes including:  L1-2: Broad-based disc osteophyte complex greater to left. Facet joint degenerative changes and ligamentum flavum hypertrophy. Mild to moderate left-sided and mild right-sided spinal stenosis/ lateral recess stenosis.  L2-3: Broad-based disc osteophyte complex greater left lateral position with slight encroachment upon but not significant compression of the exiting left L2 nerve root. Facet joint degenerative changes and ligamentum flavum hypertrophy greater on left. Moderate to marked left-sided and moderate right-sided spinal stenosis/lateral recess stenosis.  L3-4: Broad-based disc osteophyte complex. Facet joint degenerative changes and ligamentum flavum hypertrophy greater on the right. Moderate to marked right-sided and mild to moderate left-sided lateral recess stenosis. Moderate spinal stenosis with a trefoil appearance and greater on the right.  L4-5: Prominent facet joint degenerative changes without clear pars defect on the left. Right-sided pars defect not excluded. 8 mm anterior slippage of L4. Bulge. Ligamentum flavum hypertrophy. Multifactorial severe spinal stenosis and moderate to marked right-sided with mild moderate left-sided foraminal narrowing and encroachment upon the exiting L4 nerve roots greater on the right.  L5-S1. Moderate facet joint degenerative changes. Minimal anterior slip L5. Bulge/ protrusion greatest centrally and to left. Left greater than right lateral recess stenosis. Mild spinal stenosis in a right to left direction. Mild bilateral foraminal narrowing.  IMPRESSION: MR THORACIC SPINE IMPRESSION  T11 acute/subacute compression fracture with 50% loss of height. Retropulsion of the posterior superior aspect of the compressed vertebra greater to left of midline with mild flattening of the cord greater to left of midline. The appearance is suggestive of a benign osteoporotic compression fracture given the  patient's history of fall. Prior to cement augmentation, CT imaging can be performed to define the full extent of the fracture which appears to extend into the left pedicle.  Nonspecific T3 lesion as detailed above.  Scattered degenerative changes lower cervical and thoracic spine as detailed above.  Bilateral pleural effusions greater on the right.  MR LUMBAR SPINE IMPRESSION  Scoliosis and degenerative changes with various degrees of spinal stenosis and foraminal narrowing as detailed above with most notable finding the severe multifactorial spinal stenosis at the L4-5 level as detailed above.  No acute lumbar spine or upper sacral fracture.  These results will be called to the ordering clinician or representative by the Radiologist Assistant, and communication documented in the PACS Dashboard.   Electronically Signed   By: Bridgett Larsson M.D.   On: 11/24/2012 08:15   Mr Lumbar Spine Wo Contrast  11/24/2012   CLINICAL DATA:  Back pain with leg weakness.  Abnormal x-ray.  EXAM: MRI THORACIC AND LUMBAR SPINE WITHOUT CONTRAST  TECHNIQUE: Multiplanar and multiecho pulse sequences of the thoracic and lumbar spine were obtained without intravenous contrast.  COMPARISON:  11/23/2012 plain film exam.  No comparison MR.  FINDINGS: MR THORACIC SPINE FINDINGS  T11 acute/subacute compression fracture with 50% loss of height. Retropulsion of the posterior superior aspect of the compressed vertebra greater to left of midline with mild flattening of the cord greater to left of midline. The appearance is suggestive of a benign osteoporotic compression fracture given the patient's history of fall. Prior to cement augmentation, CT imaging can be performed to define the full extent of the fracture which appears to extend into the left pedicle.  No other acute thoracic spine compression fractures noted.  1.6 cm lesion within the T3 vertebral body. Centrally this appears sclerotic with peripheral fatty component. This would be  unusual for metastatic lesion but does not have typical characteristics  of a simple hemangioma. If the patient developed symptoms preferable to this region, followup imaging with contrast and attention to this region may be considered.  C6-7: Bulge/ protrusion with mild spinal stenosis with minimal cord contact incompletely assessed on the present exam.  T1-2: Minimal bulge greater right paracentral position.  T2-3: Minimal anterior slip T3.  Minimal bulge.  T3-4: Minimal bulge.  T6-7:  Shallow right protrusion.  Minimal right-sided cord contact.  T8-9: Shallow right posterior lateral protrusion without cord contact.  T9-10:  Small central protrusion with minimal if any cord contact.  T11-12: Mild left facet joint degenerative changes.  T12-L1: Broad-based disc osteophyte greater to left. Mild facet joint degenerative changes greater on left. Mild to slightly moderate left-sided and mild right-sided spinal stenosis.  Bilateral pleural effusions greater on the right.  Cardiomegaly.  MR LUMBAR SPINE FINDINGS  Curvature of the lumbar spine. Superimposed degenerative changes including:  L1-2: Broad-based disc osteophyte complex greater to left. Facet joint degenerative changes and ligamentum flavum hypertrophy. Mild to moderate left-sided and mild right-sided spinal stenosis/ lateral recess stenosis.  L2-3: Broad-based disc osteophyte complex greater left lateral position with slight encroachment upon but not significant compression of the exiting left L2 nerve root. Facet joint degenerative changes and ligamentum flavum hypertrophy greater on left. Moderate to marked left-sided and moderate right-sided spinal stenosis/lateral recess stenosis.  L3-4: Broad-based disc osteophyte complex. Facet joint degenerative changes and ligamentum flavum hypertrophy greater on the right. Moderate to marked right-sided and mild to moderate left-sided lateral recess stenosis. Moderate spinal stenosis with a trefoil appearance and  greater on the right.  L4-5: Prominent facet joint degenerative changes without clear pars defect on the left. Right-sided pars defect not excluded. 8 mm anterior slippage of L4. Bulge. Ligamentum flavum hypertrophy. Multifactorial severe spinal stenosis and moderate to marked right-sided with mild moderate left-sided foraminal narrowing and encroachment upon the exiting L4 nerve roots greater on the right.  L5-S1. Moderate facet joint degenerative changes. Minimal anterior slip L5. Bulge/ protrusion greatest centrally and to left. Left greater than right lateral recess stenosis. Mild spinal stenosis in a right to left direction. Mild bilateral foraminal narrowing.  IMPRESSION: MR THORACIC SPINE IMPRESSION  T11 acute/subacute compression fracture with 50% loss of height. Retropulsion of the posterior superior aspect of the compressed vertebra greater to left of midline with mild flattening of the cord greater to left of midline. The appearance is suggestive of a benign osteoporotic compression fracture given the patient's history of fall. Prior to cement augmentation, CT imaging can be performed to define the full extent of the fracture which appears to extend into the left pedicle.  Nonspecific T3 lesion as detailed above.  Scattered degenerative changes lower cervical and thoracic spine as detailed above.  Bilateral pleural effusions greater on the right.  MR LUMBAR SPINE IMPRESSION  Scoliosis and degenerative changes with various degrees of spinal stenosis and foraminal narrowing as detailed above with most notable finding the severe multifactorial spinal stenosis at the L4-5 level as detailed above.  No acute lumbar spine or upper sacral fracture.  These results will be called to the ordering clinician or representative by the Radiologist Assistant, and communication documented in the PACS Dashboard.   Electronically Signed   By: Bridgett Larsson M.D.   On: 11/24/2012 08:15   Dg Abd Acute W/chest  11/23/2012    CLINICAL DATA:  Abdominal and back pain.  EXAM: ACUTE ABDOMEN SERIES (ABDOMEN 2 VIEW & CHEST 1 VIEW)  COMPARISON:  Chest radiograph  of 03/10/2011. No recent abdominal imaging. A CT of 11/19/2004 is reviewed.  FINDINGS: Frontal view of the chest demonstrates right axillary surgical clips. Cardiomegaly accentuated by AP portable technique. Atherosclerosis in the transverse aorta. Probable small right pleural effusion. No pneumothorax. Moderate interstitial prominence and indistinctness. Patchy right upper and right lower lobe opacities which are suspicious for concurrent airspace disease.  Abdominal films demonstrate no free intraperitoneal air on right-sided decubitus view. Air-fluid level which is likely within the colon. Mild nonspecific paucity of small bowel gas, especially superiorly. Colonic gas identified. No bowel obstruction. Right proximal femoral fixation. Convex right lumbar spine curvature. Suspected T12 compression deformity, suboptimally evaluated. Marland Kitchen  IMPRESSION: Nonspecific paucity of small bowel gas superiorly. No evidence of free intraperitoneal air or specific evidence of obstruction.  Congestive heart failure with probable small right pleural effusion.  Patchy right-sided airspace disease which could represent alveolar edema or concurrent infection. Consider short-term chest radiographic followup to confirm resolution.  Probable T12 compression deformity.   Electronically Signed   By: Jeronimo Greaves M.D.   On: 11/23/2012 10:29         Subjective: Patient denies fevers, chills, chest discomfort or shortness of breath, nausea, vomiting, diarrhea. No abdominal pain. No headache or dizziness. No visual disturbance.  Objective: Filed Vitals:   11/25/12 0600 11/25/12 0700 11/25/12 0800 11/25/12 0900  BP: 115/29  120/46   Pulse: 65 79 91 67  Temp:      TempSrc:      Resp: 13 15 17 15   Height:      Weight:      SpO2: 96% 94% 95% 96%    Intake/Output Summary (Last 24 hours) at  11/25/12 0954 Last data filed at 11/25/12 0900  Gross per 24 hour  Intake   5555 ml  Output   6975 ml  Net  -1420 ml   Weight change:  Exam:   General:  Pt is alert, follows commands appropriately, not in acute distress  HEENT: No icterus, No thrush,  [place]/AT  Cardiovascular: RRR, S1/S2, no rubs, no gallops  Respiratory: Bibasilar crackles. No wheezing. Good air movement.  Abdomen: Soft/+BS, non tender, non distended, no guarding  Extremities: No edema, No lymphangitis, No petechiae, No rashes, no synovitis  Data Reviewed: Basic Metabolic Panel:  Recent Labs Lab 11/24/12 2047 11/24/12 2230 11/25/12 0024 11/25/12 0245 11/25/12 0630  NA 119* 122* 120* 122* 123*  K 3.1* 3.2* 3.1* 3.0* 3.2*  CL 88* 91* 88* 90* 91*  CO2 25 24 24 25 23   GLUCOSE 179* 168* 192* 171* 175*  BUN 9 10 9 7 6   CREATININE 0.59 0.55 0.52 0.48* 0.46*  CALCIUM 8.1* 8.1* 7.9* 7.8* 8.0*   Liver Function Tests:  Recent Labs Lab 11/23/12 0935  AST 28  ALT 20  ALKPHOS 97  BILITOT 0.6  PROT 6.3  ALBUMIN 3.2*    Recent Labs Lab 11/23/12 1705  LIPASE 27   No results found for this basename: AMMONIA,  in the last 168 hours CBC:  Recent Labs Lab 11/23/12 0935 11/23/12 1121 11/24/12 0410  WBC 9.9  --  5.9  NEUTROABS 8.0*  --   --   HGB 11.1* 11.6* 11.0*  HCT 30.1* 34.0* 29.9*  MCV 82.5  --  83.1  PLT 262  --  254   Cardiac Enzymes:  Recent Labs Lab 11/23/12 0935  TROPONINI <0.30   BNP: No components found with this basename: POCBNP,  CBG: No results found for this basename: GLUCAP,  in the last 168 hours  Recent Results (from the past 240 hour(s))  URINE CULTURE     Status: None   Collection Time    11/23/12  2:48 PM      Result Value Range Status   Specimen Description URINE, CATHETERIZED   Final   Special Requests NONE   Final   Culture  Setup Time     Final   Value: 11/24/2012 01:26     Performed at [facility]   Culture     Final   Value: >=100,000  COLONIES/mL ESCHERICHIA COLI     Performed at [facility]   Report Status PENDING   Incomplete  MRSA PCR SCREENING     Status: None   Collection Time    11/23/12  9:29 PM      Result Value Range Status   MRSA by PCR NEGATIVE  NEGATIVE Final   Comment:            The GeneXpert MRSA Assay (FDA     approved for NASAL specimens     only), is one component of a     comprehensive MRSA colonization     surveillance program. It is not     intended to diagnose MRSA     infection nor to guide or     monitor treatment for     MRSA infections.     Scheduled Meds: . aspirin  81 mg Oral Daily  . cefTRIAXone (ROCEPHIN)  IV  1 g Intravenous Q24H  . cholecalciferol  400 Units Oral Daily  . enoxaparin (LOVENOX) injection  40 mg Subcutaneous Q24H  . omega-3 acid ethyl esters  1 g Oral Daily  . sodium chloride  3 mL Intravenous Q12H   Continuous Infusions: . dextrose 300 mL/hr at 11/25/12 0643     Inas Avena, DO  [facility] Pager [phone]  If 7PM-7AM, please contact night-coverage [email or web address] Password [email or web address] 11/25/2012, 9:54 AM   LOS: 2 days

## 2012-11-25 NOTE — Evaluation (Signed)
Physical Therapy Evaluation Patient Details Name: Cassandra Glenn MRN: [id number] DOB: Apr 06, 1920 Today's Date: 11/25/2012 Time: 1341-1410 PT Time Calculation (min): 29 min  PT Assessment / Plan / Recommendation History of Present Illness  79 year old female with a history of breast cancer, spinal stenosis, and mild hyponatremia (130-135) presents with two-day history of nausea, vomiting, diarrhea. Patient had a mechanical fall on 11/16/2012. Increased confusion. She went to urgent care on 11/19/2012. X-rays of her pelvis and lumbar spine were negative per daughter's report. 2 days prior to admission, the urgent care contacted the patient for presumptive UTI.On admission, serum sodium 104, T11 compression fx.    Clinical Impression  Pt is weak , mild c/o dizziness upon sitting/standing. Pt ambulated to recliner. Pt will benefirt from PT to address problems. Pt plans SNF.    PT Assessment  Patient needs continued PT services    Follow Up Recommendations  SNF    Does the patient have the potential to tolerate intense rehabilitation      Barriers to Discharge        Equipment Recommendations  None recommended by PT    Recommendations for Other Services     Frequency Min 3X/week    Precautions / Restrictions Precautions Precautions: Fall   Pertinent Vitals/Pain 115/34 supine 68hr 106/47 after transfer to recliner 78 hr      Mobility  Bed Mobility Bed Mobility: Supine to Sit Supine to Sit: 4: Min assist;HOB elevated Transfers Transfers: Sit to Stand;Stand to Sit Sit to Stand: 4: Min assist Stand to Sit: 4: Min assist Details for Transfer Assistance: cues for use of RW. Ambulation/Gait Ambulation/Gait Assistance: 3: Mod assist Ambulation Distance (Feet): 5 Feet Assistive device: Rolling walker    Exercises     PT Diagnosis: Difficulty walking;Generalized weakness  PT Problem List: Decreased strength;Decreased activity tolerance;Decreased mobility;Decreased  balance;Decreased knowledge of use of DME;Decreased safety awareness;Decreased knowledge of precautions PT Treatment Interventions: DME instruction;Gait training;Functional mobility training;Therapeutic activities;Therapeutic exercise;Patient/family education     PT Goals(Current goals can be found in the care plan section) Acute Rehab PT Goals Patient Stated Goal: I want to get back doing what I did PT Goal Formulation: With patient/family Time For Goal Achievement: 12/09/12 Potential to Achieve Goals: Good  Visit Information  Last PT Received On: 11/25/12 Assistance Needed: +2 (safety/weakness) History of Present Illness: 79 year old female with a history of breast cancer, spinal stenosis, and mild hyponatremia (130-135) presents with two-day history of nausea, vomiting, diarrhea. Patient had a mechanical fall on 11/16/2012. She went to urgent care on 11/19/2012. X-rays of her pelvis and lumbar spine were negative per daughter's report. 2 days prior to admission, the urgent care contacted the patient and started her on cephalexin for presumptive UTI.On admission, serum sodium 104, T11 compression fx.         Prior Functioning  Home Living Family/patient expects to be discharged to:: Private residence (independent living- Friend's home [place]) Available Help at Discharge: Family;Friend(s) Type of Home: Apartment Home Access: Level entry Home Layout: One level Home Equipment: Environmental consultant - 2 wheels Prior Function Level of Independence: Independent with assistive device(s) Comments: drove, walked to meals with cane Communication Communication: No difficulties    Cognition  Cognition Arousal/Alertness: Awake/alert Behavior During Therapy: WFL for tasks assessed/performed Overall Cognitive Status: Within Functional Limits for tasks assessed    Extremity/Trunk Assessment Upper Extremity Assessment Upper Extremity Assessment: Generalized weakness Lower Extremity Assessment Lower  Extremity Assessment: Generalized weakness Cervical / Trunk Assessment Cervical / Trunk Assessment: Normal  Balance Balance Balance Assessed: Yes Static Standing Balance Static Standing - Balance Support: Bilateral upper extremity supported Static Standing - Level of Assistance: 4: Min assist  End of Session PT - End of Session Activity Tolerance: Patient limited by fatigue Patient left: in chair;with call bell/phone within reach;with family/visitor present Nurse Communication: Mobility status  GP     Rada Hay 11/25/2012, 2:17 PM Blanchard Kelch PT [phone]

## 2012-11-26 LAB — BASIC METABOLIC PANEL
BUN: 5 mg/dL — ABNORMAL LOW (ref 6–23)
BUN: 8 mg/dL (ref 6–23)
CO2: 27 mEq/L (ref 19–32)
CO2: 28 mEq/L (ref 19–32)
CO2: 28 mEq/L (ref 19–32)
CO2: 26 mEq/L (ref 19–32)
Calcium: 8.3 mg/dL — ABNORMAL LOW (ref 8.4–10.5)
Calcium: 8.4 mg/dL (ref 8.4–10.5)
Chloride: 92 mEq/L — ABNORMAL LOW (ref 96–112)
Chloride: 92 mEq/L — ABNORMAL LOW (ref 96–112)
Chloride: 90 mEq/L — ABNORMAL LOW (ref 96–112)
Chloride: 90 mEq/L — ABNORMAL LOW (ref 96–112)
Creatinine, Ser: 0.61 mg/dL (ref 0.50–1.10)
GFR calc Af Amer: >90 mL/min (ref >90)
GFR calc Af Amer: 89 mL/min — ABNORMAL LOW (ref >90)
GFR calc Af Amer: 90 mL/min — ABNORMAL LOW (ref >90)
GFR calc non Af Amer: 84 mL/min — ABNORMAL LOW (ref >90)
GFR calc non Af Amer: 77 mL/min — ABNORMAL LOW (ref >90)
Glucose, Bld: 152 mg/dL — ABNORMAL HIGH (ref 70–99)
Glucose, Bld: 137 mg/dL — ABNORMAL HIGH (ref 70–99)
Glucose, Bld: 143 mg/dL — ABNORMAL HIGH (ref 70–99)
Glucose, Bld: 206 mg/dL — ABNORMAL HIGH (ref 70–99)
Potassium: 3.2 mEq/L — ABNORMAL LOW (ref 3.5–5.1)
Potassium: 3.2 mEq/L — ABNORMAL LOW (ref 3.5–5.1)
Potassium: 3.6 mEq/L (ref 3.5–5.1)
Potassium: 3.5 mEq/L (ref 3.5–5.1)
Sodium: 126 mEq/L — ABNORMAL LOW (ref 135–145)
Sodium: 126 mEq/L — ABNORMAL LOW (ref 135–145)
Sodium: 125 mEq/L — ABNORMAL LOW (ref 135–145)
Sodium: 128 mEq/L — ABNORMAL LOW (ref 135–145)

## 2012-11-26 LAB — OSMOLALITY, URINE: Osmolality, Ur: 92 mOsm/kg — ABNORMAL LOW (ref 390–1090)

## 2012-11-26 MED ORDER — SENNA 8.6 MG PO TABS
2.0000 | ORAL_TABLET | Freq: Every day | ORAL | Status: DC
  Administered 2012-11-26 – 2012-11-27 (×2): 17.2 mg via ORAL
  Filled 2012-11-26 (×3): qty 2

## 2012-11-26 MED ORDER — DOCUSATE SODIUM 100 MG PO CAPS
100.0000 mg | ORAL_CAPSULE | Freq: Two times a day (BID) | ORAL | Status: DC
  Administered 2012-11-26 – 2012-11-27 (×2): 100 mg via ORAL
  Filled 2012-11-26 (×5): qty 1

## 2012-11-26 NOTE — Progress Notes (Signed)
[facility] PROGRESS NOTE  RANYIA WITTING WUJ:[id number] DOB: 15-Apr-1920 DOA: 11/23/2012 PCP: Kimber Relic, MD  Assessment/Plan: Hypotonic Hyponatremia  -Clinically the patient appears euvolemic  -However, patient likely had sodium loss from her vomiting and diarrhea  -Initially started the patient on hypertonic (3%) saline--50cc bolus, then 15cc/hr  -Pt had rapid correction with short duration of hypertonic saline  -appreciate nephrology  -Continue D5W @50cc /hr -Check TSH 0.911  -uric acid 2.4 (low)  -urine osmolality299, serum osmolarity 215  -FeNa <1%  -continue BMP every 4 hours  -discussed case with Dr. Delilah Shan D5W as Na stabilizing/improving  -transfer to medsurg Nausea, vomiting, diarrhea  -resolved  -C. difficile PCR--cancel as pt has not had any stools since admission  -May be related to cephalexin versus viral gastroenteritis  UTI--Ecoli  -continue ceftriaxone while in hospital -plan to d/c on cephalexin  Pulmonary infiltrates/opacities  -Will not start additional antibiotics at this time as the patient is afebrile without leukocytosis or tachycardia  -Continue to monitor clinically  Elevated proBNP  -Chest x-ray suggested interstitial prominence  -Echocardiogram EF 60-65%, grade 1 diastolic dysfunction, mild to moderate TR  -now clinically euvolemic Worsening back pain  -Likely secondary to mechanical fall  -MRI thoracic and lumbar spine--T-11 acute/subacute compression fracture  -discussed with neurosurgery, Dr. Danie Chandler emergent need for neurosurgical intervention as patient has no leg weakness  -Norco prn pain  Family Communication: daughter at beside  Disposition Plan: Home when medically stable  Antibiotics:  Ceftriaxone 11/24/2012>>>          Procedures/Studies: Mr Thoracic Spine Wo Contrast  11/24/2012   CLINICAL DATA:  Back pain with leg weakness.  Abnormal x-ray.  EXAM: MRI THORACIC AND LUMBAR SPINE WITHOUT CONTRAST   TECHNIQUE: Multiplanar and multiecho pulse sequences of the thoracic and lumbar spine were obtained without intravenous contrast.  COMPARISON:  11/23/2012 plain film exam.  No comparison MR.  FINDINGS: MR THORACIC SPINE FINDINGS  T11 acute/subacute compression fracture with 50% loss of height. Retropulsion of the posterior superior aspect of the compressed vertebra greater to left of midline with mild flattening of the cord greater to left of midline. The appearance is suggestive of a benign osteoporotic compression fracture given the patient's history of fall. Prior to cement augmentation, CT imaging can be performed to define the full extent of the fracture which appears to extend into the left pedicle.  No other acute thoracic spine compression fractures noted.  1.6 cm lesion within the T3 vertebral body. Centrally this appears sclerotic with peripheral fatty component. This would be unusual for metastatic lesion but does not have typical characteristics of a simple hemangioma. If the patient developed symptoms preferable to this region, followup imaging with contrast and attention to this region may be considered.  C6-7: Bulge/ protrusion with mild spinal stenosis with minimal cord contact incompletely assessed on the present exam.  T1-2: Minimal bulge greater right paracentral position.  T2-3: Minimal anterior slip T3.  Minimal bulge.  T3-4: Minimal bulge.  T6-7:  Shallow right protrusion.  Minimal right-sided cord contact.  T8-9: Shallow right posterior lateral protrusion without cord contact.  T9-10:  Small central protrusion with minimal if any cord contact.  T11-12: Mild left facet joint degenerative changes.  T12-L1: Broad-based disc osteophyte greater to left. Mild facet joint degenerative changes greater on left. Mild to slightly moderate left-sided and mild right-sided spinal stenosis.  Bilateral pleural effusions greater on the right.  Cardiomegaly.  MR LUMBAR SPINE FINDINGS  Curvature of the lumbar  spine. Superimposed  degenerative changes including:  L1-2: Broad-based disc osteophyte complex greater to left. Facet joint degenerative changes and ligamentum flavum hypertrophy. Mild to moderate left-sided and mild right-sided spinal stenosis/ lateral recess stenosis.  L2-3: Broad-based disc osteophyte complex greater left lateral position with slight encroachment upon but not significant compression of the exiting left L2 nerve root. Facet joint degenerative changes and ligamentum flavum hypertrophy greater on left. Moderate to marked left-sided and moderate right-sided spinal stenosis/lateral recess stenosis.  L3-4: Broad-based disc osteophyte complex. Facet joint degenerative changes and ligamentum flavum hypertrophy greater on the right. Moderate to marked right-sided and mild to moderate left-sided lateral recess stenosis. Moderate spinal stenosis with a trefoil appearance and greater on the right.  L4-5: Prominent facet joint degenerative changes without clear pars defect on the left. Right-sided pars defect not excluded. 8 mm anterior slippage of L4. Bulge. Ligamentum flavum hypertrophy. Multifactorial severe spinal stenosis and moderate to marked right-sided with mild moderate left-sided foraminal narrowing and encroachment upon the exiting L4 nerve roots greater on the right.  L5-S1. Moderate facet joint degenerative changes. Minimal anterior slip L5. Bulge/ protrusion greatest centrally and to left. Left greater than right lateral recess stenosis. Mild spinal stenosis in a right to left direction. Mild bilateral foraminal narrowing.  IMPRESSION: MR THORACIC SPINE IMPRESSION  T11 acute/subacute compression fracture with 50% loss of height. Retropulsion of the posterior superior aspect of the compressed vertebra greater to left of midline with mild flattening of the cord greater to left of midline. The appearance is suggestive of a benign osteoporotic compression fracture given the patient's history of  fall. Prior to cement augmentation, CT imaging can be performed to define the full extent of the fracture which appears to extend into the left pedicle.  Nonspecific T3 lesion as detailed above.  Scattered degenerative changes lower cervical and thoracic spine as detailed above.  Bilateral pleural effusions greater on the right.  MR LUMBAR SPINE IMPRESSION  Scoliosis and degenerative changes with various degrees of spinal stenosis and foraminal narrowing as detailed above with most notable finding the severe multifactorial spinal stenosis at the L4-5 level as detailed above.  No acute lumbar spine or upper sacral fracture.  These results will be called to the ordering clinician or representative by the Radiologist Assistant, and communication documented in the PACS Dashboard.   Electronically Signed   By: Bridgett Larsson M.D.   On: 11/24/2012 08:15   Mr Lumbar Spine Wo Contrast  11/24/2012   CLINICAL DATA:  Back pain with leg weakness.  Abnormal x-ray.  EXAM: MRI THORACIC AND LUMBAR SPINE WITHOUT CONTRAST  TECHNIQUE: Multiplanar and multiecho pulse sequences of the thoracic and lumbar spine were obtained without intravenous contrast.  COMPARISON:  11/23/2012 plain film exam.  No comparison MR.  FINDINGS: MR THORACIC SPINE FINDINGS  T11 acute/subacute compression fracture with 50% loss of height. Retropulsion of the posterior superior aspect of the compressed vertebra greater to left of midline with mild flattening of the cord greater to left of midline. The appearance is suggestive of a benign osteoporotic compression fracture given the patient's history of fall. Prior to cement augmentation, CT imaging can be performed to define the full extent of the fracture which appears to extend into the left pedicle.  No other acute thoracic spine compression fractures noted.  1.6 cm lesion within the T3 vertebral body. Centrally this appears sclerotic with peripheral fatty component. This would be unusual for metastatic  lesion but does not have typical characteristics of a simple hemangioma. If  the patient developed symptoms preferable to this region, followup imaging with contrast and attention to this region may be considered.  C6-7: Bulge/ protrusion with mild spinal stenosis with minimal cord contact incompletely assessed on the present exam.  T1-2: Minimal bulge greater right paracentral position.  T2-3: Minimal anterior slip T3.  Minimal bulge.  T3-4: Minimal bulge.  T6-7:  Shallow right protrusion.  Minimal right-sided cord contact.  T8-9: Shallow right posterior lateral protrusion without cord contact.  T9-10:  Small central protrusion with minimal if any cord contact.  T11-12: Mild left facet joint degenerative changes.  T12-L1: Broad-based disc osteophyte greater to left. Mild facet joint degenerative changes greater on left. Mild to slightly moderate left-sided and mild right-sided spinal stenosis.  Bilateral pleural effusions greater on the right.  Cardiomegaly.  MR LUMBAR SPINE FINDINGS  Curvature of the lumbar spine. Superimposed degenerative changes including:  L1-2: Broad-based disc osteophyte complex greater to left. Facet joint degenerative changes and ligamentum flavum hypertrophy. Mild to moderate left-sided and mild right-sided spinal stenosis/ lateral recess stenosis.  L2-3: Broad-based disc osteophyte complex greater left lateral position with slight encroachment upon but not significant compression of the exiting left L2 nerve root. Facet joint degenerative changes and ligamentum flavum hypertrophy greater on left. Moderate to marked left-sided and moderate right-sided spinal stenosis/lateral recess stenosis.  L3-4: Broad-based disc osteophyte complex. Facet joint degenerative changes and ligamentum flavum hypertrophy greater on the right. Moderate to marked right-sided and mild to moderate left-sided lateral recess stenosis. Moderate spinal stenosis with a trefoil appearance and greater on the right.  L4-5:  Prominent facet joint degenerative changes without clear pars defect on the left. Right-sided pars defect not excluded. 8 mm anterior slippage of L4. Bulge. Ligamentum flavum hypertrophy. Multifactorial severe spinal stenosis and moderate to marked right-sided with mild moderate left-sided foraminal narrowing and encroachment upon the exiting L4 nerve roots greater on the right.  L5-S1. Moderate facet joint degenerative changes. Minimal anterior slip L5. Bulge/ protrusion greatest centrally and to left. Left greater than right lateral recess stenosis. Mild spinal stenosis in a right to left direction. Mild bilateral foraminal narrowing.  IMPRESSION: MR THORACIC SPINE IMPRESSION  T11 acute/subacute compression fracture with 50% loss of height. Retropulsion of the posterior superior aspect of the compressed vertebra greater to left of midline with mild flattening of the cord greater to left of midline. The appearance is suggestive of a benign osteoporotic compression fracture given the patient's history of fall. Prior to cement augmentation, CT imaging can be performed to define the full extent of the fracture which appears to extend into the left pedicle.  Nonspecific T3 lesion as detailed above.  Scattered degenerative changes lower cervical and thoracic spine as detailed above.  Bilateral pleural effusions greater on the right.  MR LUMBAR SPINE IMPRESSION  Scoliosis and degenerative changes with various degrees of spinal stenosis and foraminal narrowing as detailed above with most notable finding the severe multifactorial spinal stenosis at the L4-5 level as detailed above.  No acute lumbar spine or upper sacral fracture.  These results will be called to the ordering clinician or representative by the Radiologist Assistant, and communication documented in the PACS Dashboard.   Electronically Signed   By: Bridgett Larsson M.D.   On: 11/24/2012 08:15   Dg Abd Acute W/chest  11/23/2012   CLINICAL DATA:  Abdominal and  back pain.  EXAM: ACUTE ABDOMEN SERIES (ABDOMEN 2 VIEW & CHEST 1 VIEW)  COMPARISON:  Chest radiograph of 03/10/2011. No recent abdominal  imaging. A CT of 11/19/2004 is reviewed.  FINDINGS: Frontal view of the chest demonstrates right axillary surgical clips. Cardiomegaly accentuated by AP portable technique. Atherosclerosis in the transverse aorta. Probable small right pleural effusion. No pneumothorax. Moderate interstitial prominence and indistinctness. Patchy right upper and right lower lobe opacities which are suspicious for concurrent airspace disease.  Abdominal films demonstrate no free intraperitoneal air on right-sided decubitus view. Air-fluid level which is likely within the colon. Mild nonspecific paucity of small bowel gas, especially superiorly. Colonic gas identified. No bowel obstruction. Right proximal femoral fixation. Convex right lumbar spine curvature. Suspected T12 compression deformity, suboptimally evaluated. Marland Kitchen  IMPRESSION: Nonspecific paucity of small bowel gas superiorly. No evidence of free intraperitoneal air or specific evidence of obstruction.  Congestive heart failure with probable small right pleural effusion.  Patchy right-sided airspace disease which could represent alveolar edema or concurrent infection. Consider short-term chest radiographic followup to confirm resolution.  Probable T12 compression deformity.   Electronically Signed   By: Jeronimo Greaves M.D.   On: 11/23/2012 10:29         Subjective: Patient is feeling better. She denies any dizziness, headache, visual disturbance, chest pain, shortness breath, nausea, vomiting, diarrhea, abdominal pain, dysuria, hematuria. No rashes.  Objective: Filed Vitals:   11/26/12 0900 11/26/12 1000 11/26/12 1100 11/26/12 1200  BP:  102/32  129/47  Pulse: 70 65 63 66  Temp:    98.1 F (36.7 C)  TempSrc:    Oral  Resp: 17 16 12 14   Height:      Weight:      SpO2: 96% 97% 97% 98%    Intake/Output Summary (Last 24 hours)  at 11/26/12 1327 Last data filed at 11/26/12 1300  Gross per 24 hour  Intake   5140 ml  Output   4275 ml  Net    865 ml   Weight change:  Exam:   General:  Pt is alert, follows commands appropriately, not in acute distress  HEENT: No icterus, No thrush, No neck mass, [place]/AT  Cardiovascular: RRR, S1/S2, no rubs, no gallops  Respiratory: CTA bilaterally, no wheezing, no crackles, no rhonchi  Abdomen: Soft/+BS, non tender, non distended, no guarding  Extremities: No edema, No lymphangitis, No petechiae, No rashes, no synovitis  Data Reviewed: Basic Metabolic Panel:  Recent Labs Lab 11/25/12 1818 11/25/12 2255 11/26/12 0235 11/26/12 0620 11/26/12 1000  NA 121* 121* 126* 126* 125*  K 3.4* 3.3* 3.3* 3.2* 3.2*  CL 88* 88* 92* 92* 90*  CO2 25 27 27 28 28   GLUCOSE 169* 154* 152* 137* 143*  BUN 7 6 5* 4* 4*  CREATININE 0.67 0.57 0.52 0.49* 0.47*  CALCIUM 8.5 8.4 8.3* 8.4 8.3*   Liver Function Tests:  Recent Labs Lab 11/23/12 0935  AST 28  ALT 20  ALKPHOS 97  BILITOT 0.6  PROT 6.3  ALBUMIN 3.2*    Recent Labs Lab 11/23/12 1705  LIPASE 27   No results found for this basename: AMMONIA,  in the last 168 hours CBC:  Recent Labs Lab 11/23/12 0935 11/23/12 1121 11/24/12 0410  WBC 9.9  --  5.9  NEUTROABS 8.0*  --   --   HGB 11.1* 11.6* 11.0*  HCT 30.1* 34.0* 29.9*  MCV 82.5  --  83.1  PLT 262  --  254   Cardiac Enzymes:  Recent Labs Lab 11/23/12 0935  TROPONINI <0.30   BNP: No components found with this basename: POCBNP,  CBG: No results found for  this basename: GLUCAP,  in the last 168 hours  Recent Results (from the past 240 hour(s))  URINE CULTURE     Status: None   Collection Time    11/23/12  2:48 PM      Result Value Range Status   Specimen Description URINE, CATHETERIZED   Final   Special Requests NONE   Final   Culture  Setup Time     Final   Value: 11/24/2012 01:26     Performed at [facility]   Culture     Final    Value: >=100,000 COLONIES/mL ESCHERICHIA COLI     Performed at [facility]   Report Status 11/25/2012 FINAL   Final   Organism ID, Bacteria ESCHERICHIA COLI   Final  MRSA PCR SCREENING     Status: None   Collection Time    11/23/12  9:29 PM      Result Value Range Status   MRSA by PCR NEGATIVE  NEGATIVE Final   Comment:            The GeneXpert MRSA Assay (FDA     approved for NASAL specimens     only), is one component of a     comprehensive MRSA colonization     surveillance program. It is not     intended to diagnose MRSA     infection nor to guide or     monitor treatment for     MRSA infections.     Scheduled Meds: . aspirin  81 mg Oral Daily  . cefTRIAXone (ROCEPHIN)  IV  1 g Intravenous Q24H  . cholecalciferol  400 Units Oral Daily  . enoxaparin (LOVENOX) injection  40 mg Subcutaneous Q24H  . omega-3 acid ethyl esters  1 g Oral Daily  . sodium chloride  3 mL Intravenous Q12H   Continuous Infusions: . dextrose 50 mL/hr at 11/26/12 1105     Derica Leiber, DO  [facility] Pager [phone]  If 7PM-7AM, please contact night-coverage [email or web address] Password [email or web address] 11/26/2012, 1:27 PM   LOS: 3 days

## 2012-11-26 NOTE — Progress Notes (Signed)
Clinical Social Work Department BRIEF PSYCHOSOCIAL ASSESSMENT 11/26/2012  Patient:  Cassandra Glenn, Cassandra Glenn     Account Number:  [id number]     Admit date:  11/23/2012  Clinical Social Worker:  Doroteo Glassman  Date/Time:  11/26/2012 09:41 AM  Referred by:  Physician  Date Referred:  11/26/2012 Referred for  SNF Placement   Other Referral:   Interview type:  Patient Other interview type:   Pt's daughter present    PSYCHOSOCIAL DATA Living Status:  FACILITY Admitted from facility:  [facility] Level of care:  Independent Living Primary support name:  Isa Rankin Primary support relationship to patient:  CHILD, ADULT Degree of support available:   strong    CURRENT CONCERNS Current Concerns  Post-Acute Placement   Other Concerns:    SOCIAL WORK ASSESSMENT / PLAN Met with Pt and daughter to discuss d/c plan.    Pt confirmed that she is from Independent [facility] at [facility] and stated that she and her family have already contacted the skilled side to inform them of her d/c needs. Per Pt's daughter, a bed is being held for Pt.    CSW thanked Pt and her daughter for their time.    Information sent to [facility] via [facility].   Assessment/plan status:  Psychosocial Support/Ongoing Assessment of Needs Other assessment/ plan:   Information/referral to community resources:   n/a--Pt to go to [facility] for rehab    PATIENT'S/FAMILY'S RESPONSE TO PLAN OF CARE: Pt's response to her plan of care was positive.  She stated that she has been to the skilled side of [facility] many times and that she's always been pleased with her care.    Pt was in good spirits and laughed easily.  She has good support from her children.   Providence Crosby, LCSWA Clinical Social Work [phone]

## 2012-11-26 NOTE — Progress Notes (Signed)
Cassandra Glenn 1230 WILL TRANSFER TO 1318. NO CHANGE IN CONDITION. REPORT CALLED TO CINDY [facility]EAST RN RECEIVING PATIENT.

## 2012-11-26 NOTE — Progress Notes (Signed)
Admit: 11/23/2012 LOS: 3  84F admit with SNa 104, suspected SIADH vs hypovolemia, recent fall and ? Vertebral copression fracture, UTI Rx keflex.    Subjective:  Na up to 125 this AM.   Feels well.  In good spirits APpetite ok.    Sodium (mEq/L)  Date Value  11/26/2012 125*  11/26/2012 126*  11/26/2012 126*  11/25/2012 121*  11/25/2012 121*  11/25/2012 122*  11/25/2012 122*  11/25/2012 122*  11/25/2012 123*  11/25/2012 122*   Uric Acid 2.4. U Os[place] 299 11/13.  --> 95 U Na 51 S [address] 215  11/15 0701 - 11/16 0700 In: 6200 [P.O.:990; I.V.:5160; IV Piggyback:50] Out: 6575 [Urine:6575]  Filed Weights   11/23/12 1700  Weight: 62 kg (136 lb 11 oz)    Current meds: reviewed  Current Labs: reviewed    Physical Exam:  Blood pressure 143/38, pulse 89, temperature 98.1 F (36.7 C), temperature source Oral, resp. rate 16, height 5\' 2"  (1.575 m), weight 62 kg (136 lb 11 oz), SpO2 95.00%. NAD, appears well.  Conversant and appropriate RRR CTAB with some coarse bs b/l. Nl wob.  Lying at 20deg in bed S/nt/nd, nabs aao x3,  Mae x4 No LEE No rashes/lesions EOMI  Assessment/Plan 1. Hypotonic euvolemic hyponatremia: Pt correcting at good pace now.  Reduce D5W to 15mL/hr and I think she will fully correct over next 24-48h.  I still think this was likely SIADH and the rapid correction was the correction of the excessive ADH levels.  No mental status or neurological issues at this time thankfully.  She should drink when thirsty but not excessively and I see no need for dietary Na restriction.  I have placed on a regular diet for now.  It will have more dietary Na.    Sabra Heck MD 11/26/2012, 11:04 AM   Recent Labs Lab 11/26/12 0235 11/26/12 0620 11/26/12 1000  NA 126* 126* 125*  K 3.3* 3.2* 3.2*  CL 92* 92* 90*  CO2 27 28 28   GLUCOSE 152* 137* 143*  BUN 5* 4* 4*  CREATININE 0.52 0.49* 0.47*  CALCIUM 8.3* 8.4 8.3*    Recent Labs Lab 11/23/12 0935 11/23/12 1121  11/24/12 0410  WBC 9.9  --  5.9  NEUTROABS 8.0*  --   --   HGB 11.1* 11.6* 11.0*  HCT 30.1* 34.0* 29.9*  MCV 82.5  --  83.1  PLT 262  --  254    Current Facility-Administered Medications  Medication Dose Route Frequency Provider Last Rate Last Dose  . acetaminophen (TYLENOL) tablet 650 mg  650 mg Oral Q6H PRN Catarina Hartshorn, MD       Or  . acetaminophen (TYLENOL) suppository 650 mg  650 mg Rectal Q6H PRN Catarina Hartshorn, MD      . aspirin chewable tablet 81 mg  81 mg Oral Daily Catarina Hartshorn, MD   81 mg at 11/25/12 1015  . cefTRIAXone (ROCEPHIN) 1 g in dextrose 5 % 50 mL IVPB  1 g Intravenous Q24H Catarina Hartshorn, MD   1 g at 11/25/12 1200  . cholecalciferol (VITAMIN D) tablet 400 Units  400 Units Oral Daily Catarina Hartshorn, MD   400 Units at 11/25/12 1015  . dextrose 5 % solution   Intravenous Continuous Arita Miss, MD 200 mL/hr at 11/26/12 0724    . enoxaparin (LOVENOX) injection 40 mg  40 mg Subcutaneous Q24H Catarina Hartshorn, MD   40 mg at 11/25/12 2249  . HYDROcodone-acetaminophen (NORCO/VICODIN) 5-325 MG per tablet  1-2 tablet  1-2 tablet Oral Q4H PRN Catarina Hartshorn, MD   1 tablet at 11/25/12 2247  . omega-3 acid ethyl esters (LOVAZA) capsule 1 g  1 g Oral Daily Catarina Hartshorn, MD   1 g at 11/25/12 1015  . ondansetron (ZOFRAN) tablet 4 mg  4 mg Oral Q6H PRN Catarina Hartshorn, MD       Or  . ondansetron [facility]) injection 4 mg  4 mg Intravenous Q6H PRN Onalee Hua Tat, MD      . sodium chloride 0.9 % injection 10-40 mL  10-40 mL Intracatheter PRN Onalee Hua Tat, MD      . sodium chloride 0.9 % injection 3 mL  3 mL Intravenous Q12H Catarina Hartshorn, MD   3 mL at 11/25/12 2250  . zolpidem (AMBIEN) tablet 5 mg  5 mg Oral QHS PRN Catarina Hartshorn, MD

## 2012-11-27 ENCOUNTER — Inpatient Hospital Stay (HOSPITAL_COMMUNITY): Payer: Medicare Other

## 2012-11-27 LAB — BASIC METABOLIC PANEL
BUN: 9 mg/dL (ref 6–23)
Creatinine, Ser: 0.52 mg/dL (ref 0.50–1.10)
GFR calc Af Amer: >90 mL/min (ref >90)
GFR calc non Af Amer: 82 mL/min — ABNORMAL LOW (ref >90)
Potassium: 3.8 mEq/L (ref 3.5–5.1)
Sodium: 128 mEq/L — ABNORMAL LOW (ref 135–145)

## 2012-11-27 LAB — CBC
Hemoglobin: 10.2 g/dL — ABNORMAL LOW (ref 12.0–15.0)
MCHC: 34.7 g/dL (ref 30.0–36.0)
RBC: 3.32 MIL/uL — ABNORMAL LOW (ref 3.87–5.11)
RDW: 14.7 % (ref 11.5–15.5)

## 2012-11-27 LAB — URINALYSIS W MICROSCOPIC + REFLEX CULTURE
Glucose, UA: NEGATIVE mg/dL
Ketones, ur: NEGATIVE mg/dL
Protein, ur: NEGATIVE mg/dL
Specific Gravity, Urine: 1.016 (ref 1.005–1.030)
pH: 7.5 (ref 5.0–8.0)

## 2012-11-27 MED ORDER — BISACODYL 10 MG RE SUPP
10.0000 mg | Freq: Once | RECTAL | Status: AC
  Administered 2012-11-27: 10 mg via RECTAL
  Filled 2012-11-27: qty 1

## 2012-11-27 NOTE — ED Provider Notes (Signed)
I saw and evaluated the patient, reviewed the resident's note and I agree with the findings and plan.  EKG Interpretation     Ventricular Rate:  71 PR Interval:  179 QRS Duration: 134 QT Interval:  438 QTC Calculation: 476 R Axis:   84 Text Interpretation:  Sinus rhythm Right bundle branch block No significant change since last tracing           Patient with altered mental status. Good mental status here. Severe hyponatremia found on by work. Will be admitted to internal medicine  Juliet Rude. Rubin Payor, MD 11/27/12 [phone]

## 2012-11-27 NOTE — Progress Notes (Signed)
[facility] PROGRESS NOTE  ERMINE STEBBINS RUE:[id number] DOB: [birth year] DOA: 11/23/2012 PCP: Kimber Relic, MD  Assessment/Plan: Hypotonic Hyponatremia  -Clinically the patient appears euvolemic  -However, patient likely had sodium loss from her vomiting and diarrhea  -Initially started the patient on hypertonic (3%) saline--50cc bolus, then 15cc/hr  -Pt had rapid correction with short duration of hypertonic saline  -appreciate nephrology  -Continue D5W @50cc /hr--->stopped 11/26/12 -Check TSH 0.911  -uric acid 2.4 (low)  -urine osmolality299, serum osmolarity 215  -FeNa <1%  -daily BMP -discussed case with Dr. Delilah Shan D5W as Na stabilizing/improving  Nausea, vomiting, diarrhea  -resolved  -C. difficile PCR--cancel as pt has not had any stools since admission  -May be related to cephalexin versus viral gastroenteritis  UTI--Ecoli  -continue ceftriaxone while in hospital D#4 -plan to d/c on cephalexin  Pulmonary infiltrates/opacities  -Will not start additional antibiotics at this time as the patient is afebrile without leukocytosis or tachycardia  -Continue to monitor clinically  -repeat CXR today as pt has increase cough -incentive spirometry/flutter valve Elevated proBNP  -Chest x-ray suggested interstitial prominence  -Echocardiogram EF 60-65%, grade 1 diastolic dysfunction, mild to moderate TR  -now clinically euvolemic  Constipation -add bisacodyl to senna and colace -does not want enema presently Worsening back pain  -Likely secondary to mechanical fall  -MRI thoracic and lumbar spine--T-11 acute/subacute compression fracture  -discussed with neurosurgery, Dr. Danie Chandler emergent need for neurosurgical intervention as patient has no leg weakness  -Norco prn pain  Family Communication: daughter at beside  Disposition Plan: Home when medically stable  Antibiotics:  Ceftriaxone 11/24/2012>>>          Procedures/Studies: Mr Thoracic Spine  Wo Contrast  11/24/2012   CLINICAL DATA:  Back pain with leg weakness.  Abnormal x-ray.  EXAM: MRI THORACIC AND LUMBAR SPINE WITHOUT CONTRAST  TECHNIQUE: Multiplanar and multiecho pulse sequences of the thoracic and lumbar spine were obtained without intravenous contrast.  COMPARISON:  11/23/2012 plain film exam.  No comparison MR.  FINDINGS: MR THORACIC SPINE FINDINGS  T11 acute/subacute compression fracture with 50% loss of height. Retropulsion of the posterior superior aspect of the compressed vertebra greater to left of midline with mild flattening of the cord greater to left of midline. The appearance is suggestive of a benign osteoporotic compression fracture given the patient's history of fall. Prior to cement augmentation, CT imaging can be performed to define the full extent of the fracture which appears to extend into the left pedicle.  No other acute thoracic spine compression fractures noted.  1.6 cm lesion within the T3 vertebral body. Centrally this appears sclerotic with peripheral fatty component. This would be unusual for metastatic lesion but does not have typical characteristics of a simple hemangioma. If the patient developed symptoms preferable to this region, followup imaging with contrast and attention to this region may be considered.  C6-7: Bulge/ protrusion with mild spinal stenosis with minimal cord contact incompletely assessed on the present exam.  T1-2: Minimal bulge greater right paracentral position.  T2-3: Minimal anterior slip T3.  Minimal bulge.  T3-4: Minimal bulge.  T6-7:  Shallow right protrusion.  Minimal right-sided cord contact.  T8-9: Shallow right posterior lateral protrusion without cord contact.  T9-10:  Small central protrusion with minimal if any cord contact.  T11-12: Mild left facet joint degenerative changes.  T12-L1: Broad-based disc osteophyte greater to left. Mild facet joint degenerative changes greater on left. Mild to slightly moderate left-sided and mild  right-sided spinal stenosis.  Bilateral pleural  effusions greater on the right.  Cardiomegaly.  MR LUMBAR SPINE FINDINGS  Curvature of the lumbar spine. Superimposed degenerative changes including:  L1-2: Broad-based disc osteophyte complex greater to left. Facet joint degenerative changes and ligamentum flavum hypertrophy. Mild to moderate left-sided and mild right-sided spinal stenosis/ lateral recess stenosis.  L2-3: Broad-based disc osteophyte complex greater left lateral position with slight encroachment upon but not significant compression of the exiting left L2 nerve root. Facet joint degenerative changes and ligamentum flavum hypertrophy greater on left. Moderate to marked left-sided and moderate right-sided spinal stenosis/lateral recess stenosis.  L3-4: Broad-based disc osteophyte complex. Facet joint degenerative changes and ligamentum flavum hypertrophy greater on the right. Moderate to marked right-sided and mild to moderate left-sided lateral recess stenosis. Moderate spinal stenosis with a trefoil appearance and greater on the right.  L4-5: Prominent facet joint degenerative changes without clear pars defect on the left. Right-sided pars defect not excluded. 8 mm anterior slippage of L4. Bulge. Ligamentum flavum hypertrophy. Multifactorial severe spinal stenosis and moderate to marked right-sided with mild moderate left-sided foraminal narrowing and encroachment upon the exiting L4 nerve roots greater on the right.  L5-S1. Moderate facet joint degenerative changes. Minimal anterior slip L5. Bulge/ protrusion greatest centrally and to left. Left greater than right lateral recess stenosis. Mild spinal stenosis in a right to left direction. Mild bilateral foraminal narrowing.  IMPRESSION: MR THORACIC SPINE IMPRESSION  T11 acute/subacute compression fracture with 50% loss of height. Retropulsion of the posterior superior aspect of the compressed vertebra greater to left of midline with mild flattening of  the cord greater to left of midline. The appearance is suggestive of a benign osteoporotic compression fracture given the patient's history of fall. Prior to cement augmentation, CT imaging can be performed to define the full extent of the fracture which appears to extend into the left pedicle.  Nonspecific T3 lesion as detailed above.  Scattered degenerative changes lower cervical and thoracic spine as detailed above.  Bilateral pleural effusions greater on the right.  MR LUMBAR SPINE IMPRESSION  Scoliosis and degenerative changes with various degrees of spinal stenosis and foraminal narrowing as detailed above with most notable finding the severe multifactorial spinal stenosis at the L4-5 level as detailed above.  No acute lumbar spine or upper sacral fracture.  These results will be called to the ordering clinician or representative by the Radiologist Assistant, and communication documented in the PACS Dashboard.   Electronically Signed   By: Bridgett Larsson M.D.   On: 11/24/2012 08:15   Mr Lumbar Spine Wo Contrast  11/24/2012   CLINICAL DATA:  Back pain with leg weakness.  Abnormal x-ray.  EXAM: MRI THORACIC AND LUMBAR SPINE WITHOUT CONTRAST  TECHNIQUE: Multiplanar and multiecho pulse sequences of the thoracic and lumbar spine were obtained without intravenous contrast.  COMPARISON:  11/23/2012 plain film exam.  No comparison MR.  FINDINGS: MR THORACIC SPINE FINDINGS  T11 acute/subacute compression fracture with 50% loss of height. Retropulsion of the posterior superior aspect of the compressed vertebra greater to left of midline with mild flattening of the cord greater to left of midline. The appearance is suggestive of a benign osteoporotic compression fracture given the patient's history of fall. Prior to cement augmentation, CT imaging can be performed to define the full extent of the fracture which appears to extend into the left pedicle.  No other acute thoracic spine compression fractures noted.  1.6 cm  lesion within the T3 vertebral body. Centrally this appears sclerotic with peripheral fatty  component. This would be unusual for metastatic lesion but does not have typical characteristics of a simple hemangioma. If the patient developed symptoms preferable to this region, followup imaging with contrast and attention to this region may be considered.  C6-7: Bulge/ protrusion with mild spinal stenosis with minimal cord contact incompletely assessed on the present exam.  T1-2: Minimal bulge greater right paracentral position.  T2-3: Minimal anterior slip T3.  Minimal bulge.  T3-4: Minimal bulge.  T6-7:  Shallow right protrusion.  Minimal right-sided cord contact.  T8-9: Shallow right posterior lateral protrusion without cord contact.  T9-10:  Small central protrusion with minimal if any cord contact.  T11-12: Mild left facet joint degenerative changes.  T12-L1: Broad-based disc osteophyte greater to left. Mild facet joint degenerative changes greater on left. Mild to slightly moderate left-sided and mild right-sided spinal stenosis.  Bilateral pleural effusions greater on the right.  Cardiomegaly.  MR LUMBAR SPINE FINDINGS  Curvature of the lumbar spine. Superimposed degenerative changes including:  L1-2: Broad-based disc osteophyte complex greater to left. Facet joint degenerative changes and ligamentum flavum hypertrophy. Mild to moderate left-sided and mild right-sided spinal stenosis/ lateral recess stenosis.  L2-3: Broad-based disc osteophyte complex greater left lateral position with slight encroachment upon but not significant compression of the exiting left L2 nerve root. Facet joint degenerative changes and ligamentum flavum hypertrophy greater on left. Moderate to marked left-sided and moderate right-sided spinal stenosis/lateral recess stenosis.  L3-4: Broad-based disc osteophyte complex. Facet joint degenerative changes and ligamentum flavum hypertrophy greater on the right. Moderate to marked right-sided  and mild to moderate left-sided lateral recess stenosis. Moderate spinal stenosis with a trefoil appearance and greater on the right.  L4-5: Prominent facet joint degenerative changes without clear pars defect on the left. Right-sided pars defect not excluded. 8 mm anterior slippage of L4. Bulge. Ligamentum flavum hypertrophy. Multifactorial severe spinal stenosis and moderate to marked right-sided with mild moderate left-sided foraminal narrowing and encroachment upon the exiting L4 nerve roots greater on the right.  L5-S1. Moderate facet joint degenerative changes. Minimal anterior slip L5. Bulge/ protrusion greatest centrally and to left. Left greater than right lateral recess stenosis. Mild spinal stenosis in a right to left direction. Mild bilateral foraminal narrowing.  IMPRESSION: MR THORACIC SPINE IMPRESSION  T11 acute/subacute compression fracture with 50% loss of height. Retropulsion of the posterior superior aspect of the compressed vertebra greater to left of midline with mild flattening of the cord greater to left of midline. The appearance is suggestive of a benign osteoporotic compression fracture given the patient's history of fall. Prior to cement augmentation, CT imaging can be performed to define the full extent of the fracture which appears to extend into the left pedicle.  Nonspecific T3 lesion as detailed above.  Scattered degenerative changes lower cervical and thoracic spine as detailed above.  Bilateral pleural effusions greater on the right.  MR LUMBAR SPINE IMPRESSION  Scoliosis and degenerative changes with various degrees of spinal stenosis and foraminal narrowing as detailed above with most notable finding the severe multifactorial spinal stenosis at the L4-5 level as detailed above.  No acute lumbar spine or upper sacral fracture.  These results will be called to the ordering clinician or representative by the Radiologist Assistant, and communication documented in the PACS Dashboard.    Electronically Signed   By: Bridgett Larsson M.D.   On: 11/24/2012 08:15   Dg Abd Acute W/chest  11/23/2012   CLINICAL DATA:  Abdominal and back pain.  EXAM: ACUTE  ABDOMEN SERIES (ABDOMEN 2 VIEW & CHEST 1 VIEW)  COMPARISON:  Chest radiograph of 03/10/2011. No recent abdominal imaging. A CT of 11/19/2004 is reviewed.  FINDINGS: Frontal view of the chest demonstrates right axillary surgical clips. Cardiomegaly accentuated by AP portable technique. Atherosclerosis in the transverse aorta. Probable small right pleural effusion. No pneumothorax. Moderate interstitial prominence and indistinctness. Patchy right upper and right lower lobe opacities which are suspicious for concurrent airspace disease.  Abdominal films demonstrate no free intraperitoneal air on right-sided decubitus view. Air-fluid level which is likely within the colon. Mild nonspecific paucity of small bowel gas, especially superiorly. Colonic gas identified. No bowel obstruction. Right proximal femoral fixation. Convex right lumbar spine curvature. Suspected T12 compression deformity, suboptimally evaluated. Marland Kitchen  IMPRESSION: Nonspecific paucity of small bowel gas superiorly. No evidence of free intraperitoneal air or specific evidence of obstruction.  Congestive heart failure with probable small right pleural effusion.  Patchy right-sided airspace disease which could represent alveolar edema or concurrent infection. Consider short-term chest radiographic followup to confirm resolution.  Probable T12 compression deformity.   Electronically Signed   By: Jeronimo Greaves M.D.   On: 11/23/2012 10:29         Subjective: Patient complains of nonproductive cough. She has some dyspnea on exertion with physical therapy today. Otherwise, she has no dyspnea at rest. Denies any fevers, chills, chest discomfort, nausea, vomiting, diarrhea. She complains of constipation. No abdominal pain. No dizziness or syncope.  Objective: Filed Vitals:   11/26/12 1600  11/26/12 2039 11/27/12 0510 11/27/12 1424  BP: 124/28 116/50 101/44 153/51  Pulse: 73 82 76 85  Temp:  98 F (36.7 C) 98.1 F (36.7 C) 98 F (36.7 C)  TempSrc:  Oral Oral Oral  Resp: 16 16 16 16   Height:      Weight:      SpO2: 98% 97% 97% 99%    Intake/Output Summary (Last 24 hours) at 11/27/12 1438 Last data filed at 11/27/12 1300  Gross per 24 hour  Intake 1410.33 ml  Output   4150 ml  Net -2739.67 ml   Weight change:  Exam:   General:  Pt is alert, follows commands appropriately, not in acute distress  HEENT: No icterus, No thrush, [place]/AT  Cardiovascular: RRR, S1/S2, no rubs, no gallops  Respiratory: Fine basilar crackles. No wheezing. Good air movement.  Abdomen: Soft/+BS, non tender, non distended, no guarding  Extremities: trace edema, No lymphangitis, No petechiae, No rashes, no synovitis  Data Reviewed: Basic Metabolic Panel:  Recent Labs Lab 11/26/12 0235 11/26/12 0620 11/26/12 1000 11/26/12 1430 11/26/12 1800  NA 126* 126* 125* 125* 128*  K 3.3* 3.2* 3.2* 3.6 3.5  CL 92* 92* 90* 90* 92*  CO2 27 28 28 26 28   GLUCOSE 152* 137* 143* 206* 103*  BUN 5* 4* 4* 5* 8  CREATININE 0.52 0.49* 0.47* 0.63 0.61  CALCIUM 8.3* 8.4 8.3* 8.5 8.8   Liver Function Tests:  Recent Labs Lab 11/23/12 0935  AST 28  ALT 20  ALKPHOS 97  BILITOT 0.6  PROT 6.3  ALBUMIN 3.2*    Recent Labs Lab 11/23/12 1705  LIPASE 27   No results found for this basename: AMMONIA,  in the last 168 hours CBC:  Recent Labs Lab 11/23/12 0935 11/23/12 1121 11/24/12 0410  WBC 9.9  --  5.9  NEUTROABS 8.0*  --   --   HGB 11.1* 11.6* 11.0*  HCT 30.1* 34.0* 29.9*  MCV 82.5  --  83.1  PLT 262  --  254   Cardiac Enzymes:  Recent Labs Lab 11/23/12 0935  TROPONINI <0.30   BNP: No components found with this basename: POCBNP,  CBG: No results found for this basename: GLUCAP,  in the last 168 hours  Recent Results (from the past 240 hour(s))  URINE CULTURE     Status:  None   Collection Time    11/23/12  2:48 PM      Result Value Range Status   Specimen Description URINE, CATHETERIZED   Final   Special Requests NONE   Final   Culture  Setup Time     Final   Value: 11/24/2012 01:26     Performed at [facility]   Culture     Final   Value: >=100,000 COLONIES/mL ESCHERICHIA COLI     Performed at [facility]   Report Status 11/25/2012 FINAL   Final   Organism ID, Bacteria ESCHERICHIA COLI   Final  MRSA PCR SCREENING     Status: None   Collection Time    11/23/12  9:29 PM      Result Value Range Status   MRSA by PCR NEGATIVE  NEGATIVE Final   Comment:            The GeneXpert MRSA Assay (FDA     approved for NASAL specimens     only), is one component of a     comprehensive MRSA colonization     surveillance program. It is not     intended to diagnose MRSA     infection nor to guide or     monitor treatment for     MRSA infections.     Scheduled Meds: . aspirin  81 mg Oral Daily  . bisacodyl  10 mg Rectal Once  . cefTRIAXone (ROCEPHIN)  IV  1 g Intravenous Q24H  . cholecalciferol  400 Units Oral Daily  . docusate sodium  100 mg Oral BID  . enoxaparin (LOVENOX) injection  40 mg Subcutaneous Q24H  . omega-3 acid ethyl esters  1 g Oral Daily  . senna  2 tablet Oral Daily  . sodium chloride  3 mL Intravenous Q12H   Continuous Infusions:    Davine Coba, DO  [facility] Pager [phone]  If 7PM-7AM, please contact night-coverage [email or web address] Password [email or web address] 11/27/2012, 2:38 PM   LOS: 4 days

## 2012-11-27 NOTE — Progress Notes (Signed)
Physical Therapy Treatment Patient Details Name: Cassandra Glenn MRN: [id number] DOB: 07-Jul-1920 Today's Date: 11/27/2012 Time: 4098-1191 PT Time Calculation (min): 22 min  PT Assessment / Plan / Recommendation  History of Present Illness 79 year old female with a history of breast cancer, spinal stenosis, and mild hyponatremia (130-135) presents with two-day history of nausea, vomiting, diarrhea. Patient had a mechanical fall on 11/16/2012. She went to urgent care on 11/19/2012. X-rays of her pelvis and lumbar spine were negative per daughter's report. 2 days prior to admission, the urgent care contacted the patient and started her on cephalexin for presumptive UTI.On admission, serum sodium 104, T11 compression fx.     PT Comments   Pt is having a deep cough and stress incontinence. Pt tolerated ambulation in hall.   Follow Up Recommendations  SNF     Does the patient have the potential to tolerate intense rehabilitation     Barriers to Discharge        Equipment Recommendations  None recommended by PT    Recommendations for Other Services    Frequency Min 3X/week   Progress towards PT Goals Progress towards PT goals: Progressing toward goals  Plan Current plan remains appropriate    Precautions / Restrictions Precautions Precautions: Fall   Pertinent Vitals/Pain     Mobility  Bed Mobility Bed Mobility: Not assessed Transfers Sit to Stand: 4: Min guard;From toilet;From chair/3-in-1;With upper extremity assist;With armrests Stand to Sit: To chair/3-in-1;To toilet;With upper extremity assist;With armrests Ambulation/Gait Ambulation/Gait Assistance: 4: Min assist Ambulation Distance (Feet): 200 Feet Assistive device: Rolling walker Ambulation/Gait Assistance Details: cues for sequence and posture. Gait Pattern: Step-through pattern Gait velocity: wfl    Exercises     PT Diagnosis:    PT Problem List:   PT Treatment Interventions:     PT Goals (current goals can  now be found in the care plan section)    Visit Information  Last PT Received On: 11/27/12 Assistance Needed: +1 History of Present Illness: 79 year old female with a history of breast cancer, spinal stenosis, and mild hyponatremia (130-135) presents with two-day history of nausea, vomiting, diarrhea. Patient had a mechanical fall on 11/16/2012. She went to urgent care on 11/19/2012. X-rays of her pelvis and lumbar spine were negative per daughter's report. 2 days prior to admission, the urgent care contacted the patient and started her on cephalexin for presumptive UTI.On admission, serum sodium 104, T11 compression fx.      Subjective Data      Cognition  Cognition Arousal/Alertness: Awake/alert Behavior During Therapy: WFL for tasks assessed/performed Overall Cognitive Status: Within Functional Limits for tasks assessed    Balance  Dynamic Standing Balance Dynamic Standing - Balance Support: No upper extremity supported Dynamic Standing - Level of Assistance: 5: Stand by assistance;4: Min assist Dynamic Standing - Comments: pt manipulated panties and pad whiole standing.  End of Session PT - End of Session Activity Tolerance: Patient tolerated treatment well Patient left: in chair;with call bell/phone within reach;with family/visitor present Nurse Communication: Mobility status   GP     Rada Hay 11/27/2012, 4:26 PM Blanchard Kelch PT [phone]

## 2012-11-27 NOTE — Care Management Note (Signed)
   CARE MANAGEMENT NOTE 11/27/2012  Patient:  Cassandra Glenn, Cassandra Glenn   Account Number:  [id number]  Date Initiated:  11/27/2012  Documentation initiated by:  Waller Marcussen  Subjective/Objective Assessment:   79 yo female admitted s/p fall. PCP: Kimber Relic, MD     Action/Plan:   SNF   Anticipated DC Date:     Anticipated DC Plan:  SKILLED NURSING FACILITY  In-house referral  Clinical Social Worker      DC Planning Services  CM consult      Choice offered to / List presented to:  NA   DME arranged  NA      DME agency  NA     HH arranged  NA      HH agency  NA   Status of service:  Completed, signed off Medicare Important Message given?   (If response is "NO", the following Medicare IM given date fields will be blank) Date Medicare IM given:   Date Additional Medicare IM given:    Discharge Disposition:    Per UR Regulation:  Reviewed for med. necessity/level of care/duration of stay  If discussed at Long Length of Stay Meetings, dates discussed:    Comments:  11/27/12 1155 Belkys Henault,RN,MSN [phone] chart reviewed for utilization of services. No needs idnetified. Pt disposition to SNF. CSW following. Cm to sign off.

## 2012-11-27 NOTE — Progress Notes (Addendum)
  [facility] Progress Note    Subjective: Feeling good, up in chair, no labs yet today, Na was up to 128 last night   Exam  Blood pressure 101/44, pulse 76, temperature 98.1 F (36.7 C), temperature source Oral, resp. rate 16, height 5\' 2"  (1.575 m), weight 62 kg (136 lb 11 oz), SpO2 97.00%.  gen: alert, up in chair  neck: no jvd  chest: occ rhonchi, no rales  cor: reg, no murmur or rub  abd: soft, nt, nd  ext: no leg edema  neuro: ox3, nf  A/P: 1. Hyponatremia, severe- initial Na 104 in setting of N/V related to UTI.  Now 128. SIADH vs hypovolemic. Correcting, will stop D5W, order labs, remove foley; if Na continuing to improve anticipate could be discharged soon.  No need for fluid restriction at this time, cont regular diet. Na intake should not be restricted.  2. UTI- on rocephin    Vinson Moselle MD  pager [phone]    cell [phone]  11/27/2012, 1:40 PM   Recent Labs Lab 11/26/12 1000 11/26/12 1430 11/26/12 1800  NA 125* 125* 128*  K 3.2* 3.6 3.5  CL 90* 90* 92*  CO2 28 26 28   GLUCOSE 143* 206* 103*  BUN 4* 5* 8  CREATININE 0.47* 0.63 0.61  CALCIUM 8.3* 8.5 8.8    Recent Labs Lab 11/23/12 0935  AST 28  ALT 20  ALKPHOS 97  BILITOT 0.6  PROT 6.3  ALBUMIN 3.2*    Recent Labs Lab 11/23/12 0935 11/23/12 1121 11/24/12 0410  WBC 9.9  --  5.9  NEUTROABS 8.0*  --   --   HGB 11.1* 11.6* 11.0*  HCT 30.1* 34.0* 29.9*  MCV 82.5  --  83.1  PLT 262  --  254   . aspirin  81 mg Oral Daily  . cefTRIAXone (ROCEPHIN)  IV  1 g Intravenous Q24H  . cholecalciferol  400 Units Oral Daily  . docusate sodium  100 mg Oral BID  . enoxaparin (LOVENOX) injection  40 mg Subcutaneous Q24H  . omega-3 acid ethyl esters  1 g Oral Daily  . senna  2 tablet Oral Daily  . sodium chloride  3 mL Intravenous Q12H   . dextrose 50 mL/hr at 11/27/12 1221   acetaminophen, acetaminophen, HYDROcodone-acetaminophen, ondansetron (ZOFRAN) IV, ondansetron, sodium chloride,  zolpidem

## 2012-11-28 DIAGNOSIS — M6281 Muscle weakness (generalized): Secondary | ICD-10-CM | POA: Diagnosis not present

## 2012-11-28 DIAGNOSIS — G4700 Insomnia, unspecified: Secondary | ICD-10-CM | POA: Diagnosis not present

## 2012-11-28 DIAGNOSIS — R079 Chest pain, unspecified: Secondary | ICD-10-CM | POA: Diagnosis not present

## 2012-11-28 DIAGNOSIS — I4510 Unspecified right bundle-branch block: Secondary | ICD-10-CM | POA: Diagnosis not present

## 2012-11-28 DIAGNOSIS — D638 Anemia in other chronic diseases classified elsewhere: Secondary | ICD-10-CM | POA: Diagnosis not present

## 2012-11-28 DIAGNOSIS — K589 Irritable bowel syndrome without diarrhea: Secondary | ICD-10-CM | POA: Diagnosis not present

## 2012-11-28 DIAGNOSIS — S22009A Unspecified fracture of unspecified thoracic vertebra, initial encounter for closed fracture: Secondary | ICD-10-CM | POA: Diagnosis not present

## 2012-11-28 DIAGNOSIS — R262 Difficulty in walking, not elsewhere classified: Secondary | ICD-10-CM | POA: Diagnosis not present

## 2012-11-28 DIAGNOSIS — D500 Iron deficiency anemia secondary to blood loss (chronic): Secondary | ICD-10-CM | POA: Diagnosis not present

## 2012-11-28 DIAGNOSIS — E871 Hypo-osmolality and hyponatremia: Secondary | ICD-10-CM | POA: Diagnosis not present

## 2012-11-28 DIAGNOSIS — M545 Low back pain: Secondary | ICD-10-CM | POA: Diagnosis not present

## 2012-11-28 DIAGNOSIS — R42 Dizziness and giddiness: Secondary | ICD-10-CM | POA: Diagnosis not present

## 2012-11-28 DIAGNOSIS — N390 Urinary tract infection, site not specified: Secondary | ICD-10-CM | POA: Diagnosis not present

## 2012-11-28 DIAGNOSIS — R279 Unspecified lack of coordination: Secondary | ICD-10-CM | POA: Diagnosis not present

## 2012-11-28 DIAGNOSIS — D649 Anemia, unspecified: Secondary | ICD-10-CM | POA: Diagnosis not present

## 2012-11-28 DIAGNOSIS — IMO0002 Reserved for concepts with insufficient information to code with codable children: Secondary | ICD-10-CM | POA: Diagnosis not present

## 2012-11-28 DIAGNOSIS — I10 Essential (primary) hypertension: Secondary | ICD-10-CM | POA: Diagnosis not present

## 2012-11-28 LAB — BASIC METABOLIC PANEL
GFR calc Af Amer: >90 mL/min (ref >90)
GFR calc non Af Amer: 82 mL/min — ABNORMAL LOW (ref >90)
Glucose, Bld: 120 mg/dL — ABNORMAL HIGH (ref 70–99)
Potassium: 3.9 mEq/L (ref 3.5–5.1)
Sodium: 131 mEq/L — ABNORMAL LOW (ref 135–145)

## 2012-11-28 LAB — CBC
Hemoglobin: 10 g/dL — ABNORMAL LOW (ref 12.0–15.0)
MCH: 30.5 pg (ref 26.0–34.0)
RBC: 3.28 MIL/uL — ABNORMAL LOW (ref 3.87–5.11)
WBC: 7.4 10*3/uL (ref 4.0–10.5)

## 2012-11-28 MED ORDER — HYDROCODONE-ACETAMINOPHEN 5-325 MG PO TABS: 1.0000 | ORAL_TABLET | ORAL | Status: DC | PRN

## 2012-11-28 MED ORDER — ZOLPIDEM TARTRATE 5 MG PO TABS: 5.0000 mg | ORAL_TABLET | Freq: Every evening | ORAL | Status: DC | PRN

## 2012-11-28 MED ORDER — CIPROFLOXACIN HCL 250 MG PO TABS: 250.0000 mg | ORAL_TABLET | Freq: Two times a day (BID) | ORAL | Status: DC

## 2012-11-28 NOTE — Progress Notes (Signed)
  [facility] Progress Note    Subjective: Na up 131 today   Exam  Blood pressure 130/66, pulse 84, temperature 98.1 F (36.7 C), temperature source Oral, resp. rate 16, height 5\' 2"  (1.575 m), weight 62 kg (136 lb 11 oz), SpO2 97.00%.  gen: alert, up in chair  neck: no jvd  chest: occ rhonchi, no rales  cor: reg, no murmur or rub  abd: soft, nt, nd  ext: no leg edema  neuro: ox3, nf  A/P: 1. Hyponatremia, severe- initial Na 104 in setting of N/V related to UTI.  Na 131 today. Would not recommend fluid restriction at discharge. Will sign off, please call as needed.  2. UTI- on rocephin    Vinson Moselle MD  pager [phone]    cell [phone]  11/28/2012, 8:44 AM   Recent Labs Lab 11/26/12 1800 11/27/12 1600 11/28/12 0520  NA 128* 128* 131*  K 3.5 3.8 3.9  CL 92* 93* 94*  CO2 28 28 29   GLUCOSE 103* 113* 120*  BUN 8 9 7   CREATININE 0.61 0.52 0.51  CALCIUM 8.8 8.4 8.6    Recent Labs Lab 11/23/12 0935  AST 28  ALT 20  ALKPHOS 97  BILITOT 0.6  PROT 6.3  ALBUMIN 3.2*    Recent Labs Lab 11/23/12 0935  11/24/12 0410 11/27/12 1600 11/28/12 0520  WBC 9.9  --  5.9 11.1* 7.4  NEUTROABS 8.0*  --   --   --   --   HGB 11.1*  < > 11.0* 10.2* 10.0*  HCT 30.1*  < > 29.9* 29.4* 29.1*  MCV 82.5  --  83.1 88.6 88.7  PLT 262  --  254 315 293  < > = values in this interval not displayed. Marland Kitchen aspirin  81 mg Oral Daily  . cefTRIAXone (ROCEPHIN)  IV  1 g Intravenous Q24H  . cholecalciferol  400 Units Oral Daily  . docusate sodium  100 mg Oral BID  . enoxaparin (LOVENOX) injection  40 mg Subcutaneous Q24H  . omega-3 acid ethyl esters  1 g Oral Daily  . senna  2 tablet Oral Daily  . sodium chloride  3 mL Intravenous Q12H     acetaminophen, acetaminophen, HYDROcodone-acetaminophen, ondansetron (ZOFRAN) IV, ondansetron, sodium chloride, zolpidem

## 2012-11-28 NOTE — Progress Notes (Signed)
PICC Line removal site:dressing clean dry and intact

## 2012-11-28 NOTE — Progress Notes (Signed)
MD, patient voided around 0330 am with bright red blood in her urine and a few dark clots.  Prior to this patient had voided several times and urine was yellow and clear (when specimen was sent).  Patient says this is the first time she has seen blood in her urine.  Patient has no other complaints right now.  Will continue to monitor.Kenton Kingfisher [place]

## 2012-11-28 NOTE — Progress Notes (Signed)
Report called to [facility] spoke with Camelia Eng

## 2012-11-28 NOTE — Progress Notes (Signed)
Clinical Social Work Department CLINICAL SOCIAL WORK PLACEMENT NOTE 11/28/2012  Patient:  Cassandra Glenn, Cassandra Glenn  Account Number:  [id number] Admit date:  11/23/2012  Clinical Social Worker:  Jacelyn Grip  Date/time:  11/26/2012 09:45 AM  Clinical Social Work is seeking post-discharge placement for this patient at the following level of care:   SKILLED NURSING   (*CSW will update this form in Epic as items are completed)   11/26/2012  Patient/family provided with [facility] Department of Clinical Social Work's list of facilities offering this level of care within the geographic area requested by the patient (or if unable, by the patient's family).  11/26/2012  Patient/family informed of their freedom to choose among providers that offer the needed level of care, that participate in Medicare, Medicaid or managed care program needed by the patient, have an available bed and are willing to accept the patient.  11/26/2012  Patient/family informed of [facility]' ownership interest in [facility], as well as of the fact that they are under no obligation to receive care at this facility.  PASARR submitted to [employer] on 11/26/2012 PASARR number received from [employer] on 11/26/2012  FL2 transmitted to all facilities in geographic area requested by pt/family on  11/26/2012 FL2 transmitted to all facilities within larger geographic area on   Patient informed that his/her managed care company has contracts with or will negotiate with  certain facilities, including the following:     Patient/family informed of bed offers received:  11/26/2012 Patient chooses bed at [facility] Physician recommends and patient chooses bed at    Patient to be transferred to [facility] on  11/28/2012 Patient to be transferred to facility by ambulance Sharin Mons)  The following physician request were entered in Epic:   Additional Comments:   Jacklynn Lewis, MSW, LCSWA  Clinical Social  Work [phone]

## 2012-11-28 NOTE — Discharge Summary (Signed)
Physician Discharge Summary  Cassandra Glenn ZOX:[id number] DOB: Jun 23, 1920 DOA: 11/23/2012  PCP: Kimber Relic, MD  Admit date: 11/23/2012 Discharge date: 11/28/2012  Recommendations for Outpatient Follow-up:  1. Pt will need to follow up with PCP in 2 weeks post discharge 2. Please obtain BMP to evaluate electrolytes and kidney function 3. Please also check CBC to evaluate Hg and Hct levels   Discharge Diagnoses:  Active Problems:   Unspecified constipation   Compression fracture of thoracic vertebra   UTI (urinary tract infection) Hypotonic Hyponatremia  -Na 104 at time of admission;  Na 131 on day of d/c -likely from volume depletion with a degree of SIADH -Clinically the patient appears euvolemic  -However, patient likely had sodium loss from her vomiting and diarrhea  -Initially started the patient on hypertonic (3%) saline--50cc bolus, then 15cc/hr  -Pt had rapid correction with short duration of hypertonic saline and D5W was started to slow the correction -appreciate nephrology  -Continue D5W @50cc /hr--->stopped 11/26/12  -Check TSH 0.911  -uric acid 2.4 (low)  -urine osmolality299, serum osmolarity 215  -FeNa <1% initially -NO sodium restriction on the patient's diet Nausea, vomiting, diarrhea  -resolved  -C. difficile PCR--cancel as pt has not had any stools since admission  -May be related to cephalexin versus viral gastroenteritis  UTI--Ecoli  -continued ceftriaxone while in hospital D#5  -plan to d/c on ciprofloxacin 250mg  bid x 5 more days to finish 10 days total  Pulmonary infiltrates/opacities  -Will not start additional antibiotics at this time as the patient is afebrile without leukocytosis or tachycardia  -Continue to monitor clinically  -repeat CXR today as pt has increase cough--no infiltrates, mild pulmonary vascular congestion  -incentive spirometry/flutter valve  Elevated proBNP  -11/23/12 Chest x-ray suggested interstitial prominence   -Echocardiogram EF 60-65%, grade 1 diastolic dysfunction, mild to moderate TR  -now clinically euvolemic, not volume overloaded -no respiratory distress--97-98% on room air Constipation  -add bisacodyl to senna and colace  -Patient had a bowel movement with the above Worsening back pain  -Likely secondary to mechanical fall  -MRI thoracic and lumbar spine--T-11 acute/subacute compression fracture  -discussed with neurosurgery, Dr. Danie Chandler emergent need for neurosurgical intervention as patient has no leg weakness  -Norco prn pain  -Physical therapy worked with the patient and recommended skilled nursing facility which was facilitated by the social worker Family Communication: daughter at beside  Disposition Plan: SNF at [facility] Antibiotics:  Ceftriaxone 11/24/2012>>>11/28/12 cipro 11/29/12>>>   Discharge Condition: Stable  Disposition:  skilled nursing facility  Diet: Regular Wt Readings from Last 3 Encounters:  11/23/12 62 kg (136 lb 11 oz)  10/31/12 59.421 kg (131 lb)  07/04/12 58.968 kg (130 lb)    History of present illness:   79 year old female with a history of breast cancer, spinal stenosis, and mild hyponatremia (130-135) presents with two-day history of nausea, vomiting, diarrhea. Patient had a mechanical fall on 11/16/2012. She went to urgent care on 11/19/2012. X-rays of her pelvis and lumbar spine were negative per daughter's report. 2 days prior to admission, the urgent care contacted the patient and started her on cephalexin for presumptive UTI. The patient has been on cephalexin since that period of time. After starting cephalexin, the patient began experiencing nausea, vomiting, diarrhea. The patient has had approximately 5-6 episodes of vomitus without any blood. She has had 2-3 loose bowel movements without any hematochezia or melena. For the past 2 days, the patient has been confused and less alert. As a result the  patient was brought to the emergency  department. At baseline, the patient is very independent and continues to drive. [place] [facility] There has not been any reports of fevers, chills, chest discomfort, shortness of breath, dysuria, hematuria, syncope. The patient has had some dizziness for the past 2 days. In addition, she continues to complain of lower back pain with new urinary incontinence. However, she denies any new lower extremity weakness.  In the emergency department, the patient was found to have a Serum sodium of 104. Her vital signs were stable with 95% oxygen saturation on room air. CBC revealed WBC of 9.9., Hemoglobin 11.1. Potassium was 4.3. Serum creatinine 0.43. Hepatic panel was unremarkable. Acute abdominal series was negative for any free air or obstruction. Chest x-ray revealed mild interstitial prominence with patchy right upper lobe and right lower lobe opacities. There is also a question of a T12 compression fracture. In the ED, 500 cc normal saline was given to the patient. The patient was started on hypertonic saline after a PICC line was placed. Nephrology was consulted to assist with management of her sodium.   Consultants: nephrology  Discharge Exam: Filed Vitals:   11/28/12 0600  BP: 130/66  Pulse: 84  Temp:   Resp:    Filed Vitals:   11/27/12 2131 11/28/12 0517 11/28/12 0559 11/28/12 0600  BP: 126/45 141/53 127/75 130/66  Pulse: 75 78 69 84  Temp: 98 F (36.7 C) 98.1 F (36.7 C)    TempSrc: Oral Oral    Resp: 16 16    Height:      Weight:      SpO2: 98% 97%     General: A&O x 3, NAD, pleasant, cooperative Cardiovascular: RRR, no rub, no gallop, no S3 Respiratory: Fine basilar crackles. No wheezing. Good air movement. Abdomen:soft, nontender, nondistended, positive bowel sounds Extremities: traceLE edema, No lymphangitis, no petechiae  Discharge Instructions      Discharge Orders   Future Appointments Provider Department Dept Phone   02/13/2013 9:45 AM Kimber Relic, MD [facility] [phone]   Future Orders Complete By Expires   Diet - low sodium heart healthy  As directed    Discharge instructions  As directed    Comments:     Start taking ciprofloxacine 250mg  po two times a day--start 11/29/12 morning for 5 days total   Increase activity slowly  As directed        Medication List    STOP taking these medications       cephALEXin 250 MG capsule  Commonly known as:  KEFLEX     ibuprofen 200 MG tablet  Commonly known as:  ADVIL,MOTRIN     Lutein 6 MG Tabs     traMADol 50 MG tablet  Commonly known as:  ULTRAM      TAKE these medications       acetaminophen 500 MG tablet  Commonly known as:  TYLENOL  Take 500 mg by mouth every 6 (six) hours as needed for pain.     aspirin 81 MG tablet  Take 81 mg by mouth daily.     Bilberry 1000 MG Caps  Take 1 capsule by mouth daily.     CALCIUM 600/VITAMIN D PO  Take by mouth 2 (two) times daily.     cholecalciferol 400 UNITS Tabs tablet  Commonly known as:  VITAMIN D  Take 400 Units by mouth daily. Take 1 tablet daily for vitamin supplement.     ciprofloxacin  250 MG tablet  Commonly known as:  CIPRO  Take 1 tablet (250 mg total) by mouth 2 (two) times daily. Start 11/29/12     ESTRACE VAGINAL 0.1 MG/GM vaginal cream  Generic drug:  estradiol  Insert 2 gr vaginally 2-3 times a week at bedtime     fish oil-omega-3 fatty acids 1000 MG capsule  Take 2 g by mouth daily. Take 1 capsule daily for vitamin supplement.     GLUCOSAMINE PLUS VITAMIN D 1500-200 MG-UNIT Tabs  Take 1 tablet by mouth 2 (two) times daily. Take 1 tablet twice daily for joint health     HYDROcodone-acetaminophen 5-325 MG per tablet  Commonly known as:  NORCO/VICODIN  Take 1-2 tablets by mouth every 4 (four) hours as needed for moderate pain.     polycarbophil 625 MG tablet  Commonly known as:  FIBERCON  Take 625 mg by mouth daily.     PRESERVISION AREDS 2 Caps  Take 2 capsules by mouth daily.      STRESS FORMULA 500/ZINC Tabs  Take 1 tablet by mouth daily.     RESTORA Caps  Take 1 capsule by mouth daily.     zolpidem 5 MG tablet  Commonly known as:  AMBIEN  Take 5 mg by mouth at bedtime as needed. sleep         The results of significant diagnostics from this hospitalization (including imaging, microbiology, ancillary and laboratory) are listed below for reference.    Significant Diagnostic Studies: Dg Chest 2 View  11/27/2012   CLINICAL DATA:  Cough, short of breath  EXAM: CHEST  2 VIEW  COMPARISON:  Chest radiograph 11/23/2012  FINDINGS: Interval placement of a left PICC line with tip in the mid superior vena cava. The stable cardiac silhouette. There is an increasing right pleural effusion. Mild central venous congestion is noted. There is biapical pleural parenchymal thickening similar prior. No pneumothorax.  IMPRESSION: 1. PICC line with tip in the mid SVC. 2. Increasing right pleural effusion. 3. Similar central venous congestion.   Electronically Signed   By: Genevive Bi M.D.   On: 11/27/2012 16:11   Mr Thoracic Spine Wo Contrast  11/24/2012   CLINICAL DATA:  Back pain with leg weakness.  Abnormal x-ray.  EXAM: MRI THORACIC AND LUMBAR SPINE WITHOUT CONTRAST  TECHNIQUE: Multiplanar and multiecho pulse sequences of the thoracic and lumbar spine were obtained without intravenous contrast.  COMPARISON:  11/23/2012 plain film exam.  No comparison MR.  FINDINGS: MR THORACIC SPINE FINDINGS  T11 acute/subacute compression fracture with 50% loss of height. Retropulsion of the posterior superior aspect of the compressed vertebra greater to left of midline with mild flattening of the cord greater to left of midline. The appearance is suggestive of a benign osteoporotic compression fracture given the patient's history of fall. Prior to cement augmentation, CT imaging can be performed to define the full extent of the fracture which appears to extend into the left pedicle.  No  other acute thoracic spine compression fractures noted.  1.6 cm lesion within the T3 vertebral body. Centrally this appears sclerotic with peripheral fatty component. This would be unusual for metastatic lesion but does not have typical characteristics of a simple hemangioma. If the patient developed symptoms preferable to this region, followup imaging with contrast and attention to this region may be considered.  C6-7: Bulge/ protrusion with mild spinal stenosis with minimal cord contact incompletely assessed on the present exam.  T1-2: Minimal bulge greater right paracentral position.  T2-3: Minimal anterior slip T3.  Minimal bulge.  T3-4: Minimal bulge.  T6-7:  Shallow right protrusion.  Minimal right-sided cord contact.  T8-9: Shallow right posterior lateral protrusion without cord contact.  T9-10:  Small central protrusion with minimal if any cord contact.  T11-12: Mild left facet joint degenerative changes.  T12-L1: Broad-based disc osteophyte greater to left. Mild facet joint degenerative changes greater on left. Mild to slightly moderate left-sided and mild right-sided spinal stenosis.  Bilateral pleural effusions greater on the right.  Cardiomegaly.  MR LUMBAR SPINE FINDINGS  Curvature of the lumbar spine. Superimposed degenerative changes including:  L1-2: Broad-based disc osteophyte complex greater to left. Facet joint degenerative changes and ligamentum flavum hypertrophy. Mild to moderate left-sided and mild right-sided spinal stenosis/ lateral recess stenosis.  L2-3: Broad-based disc osteophyte complex greater left lateral position with slight encroachment upon but not significant compression of the exiting left L2 nerve root. Facet joint degenerative changes and ligamentum flavum hypertrophy greater on left. Moderate to marked left-sided and moderate right-sided spinal stenosis/lateral recess stenosis.  L3-4: Broad-based disc osteophyte complex. Facet joint degenerative changes and ligamentum flavum  hypertrophy greater on the right. Moderate to marked right-sided and mild to moderate left-sided lateral recess stenosis. Moderate spinal stenosis with a trefoil appearance and greater on the right.  L4-5: Prominent facet joint degenerative changes without clear pars defect on the left. Right-sided pars defect not excluded. 8 mm anterior slippage of L4. Bulge. Ligamentum flavum hypertrophy. Multifactorial severe spinal stenosis and moderate to marked right-sided with mild moderate left-sided foraminal narrowing and encroachment upon the exiting L4 nerve roots greater on the right.  L5-S1. Moderate facet joint degenerative changes. Minimal anterior slip L5. Bulge/ protrusion greatest centrally and to left. Left greater than right lateral recess stenosis. Mild spinal stenosis in a right to left direction. Mild bilateral foraminal narrowing.  IMPRESSION: MR THORACIC SPINE IMPRESSION  T11 acute/subacute compression fracture with 50% loss of height. Retropulsion of the posterior superior aspect of the compressed vertebra greater to left of midline with mild flattening of the cord greater to left of midline. The appearance is suggestive of a benign osteoporotic compression fracture given the patient's history of fall. Prior to cement augmentation, CT imaging can be performed to define the full extent of the fracture which appears to extend into the left pedicle.  Nonspecific T3 lesion as detailed above.  Scattered degenerative changes lower cervical and thoracic spine as detailed above.  Bilateral pleural effusions greater on the right.  MR LUMBAR SPINE IMPRESSION  Scoliosis and degenerative changes with various degrees of spinal stenosis and foraminal narrowing as detailed above with most notable finding the severe multifactorial spinal stenosis at the L4-5 level as detailed above.  No acute lumbar spine or upper sacral fracture.  These results will be called to the ordering clinician or representative by the Radiologist  Assistant, and communication documented in the PACS Dashboard.   Electronically Signed   By: Bridgett Larsson M.D.   On: 11/24/2012 08:15   Mr Lumbar Spine Wo Contrast  11/24/2012   CLINICAL DATA:  Back pain with leg weakness.  Abnormal x-ray.  EXAM: MRI THORACIC AND LUMBAR SPINE WITHOUT CONTRAST  TECHNIQUE: Multiplanar and multiecho pulse sequences of the thoracic and lumbar spine were obtained without intravenous contrast.  COMPARISON:  11/23/2012 plain film exam.  No comparison MR.  FINDINGS: MR THORACIC SPINE FINDINGS  T11 acute/subacute compression fracture with 50% loss of height. Retropulsion of the posterior superior aspect of the compressed vertebra  greater to left of midline with mild flattening of the cord greater to left of midline. The appearance is suggestive of a benign osteoporotic compression fracture given the patient's history of fall. Prior to cement augmentation, CT imaging can be performed to define the full extent of the fracture which appears to extend into the left pedicle.  No other acute thoracic spine compression fractures noted.  1.6 cm lesion within the T3 vertebral body. Centrally this appears sclerotic with peripheral fatty component. This would be unusual for metastatic lesion but does not have typical characteristics of a simple hemangioma. If the patient developed symptoms preferable to this region, followup imaging with contrast and attention to this region may be considered.  C6-7: Bulge/ protrusion with mild spinal stenosis with minimal cord contact incompletely assessed on the present exam.  T1-2: Minimal bulge greater right paracentral position.  T2-3: Minimal anterior slip T3.  Minimal bulge.  T3-4: Minimal bulge.  T6-7:  Shallow right protrusion.  Minimal right-sided cord contact.  T8-9: Shallow right posterior lateral protrusion without cord contact.  T9-10:  Small central protrusion with minimal if any cord contact.  T11-12: Mild left facet joint degenerative changes.   T12-L1: Broad-based disc osteophyte greater to left. Mild facet joint degenerative changes greater on left. Mild to slightly moderate left-sided and mild right-sided spinal stenosis.  Bilateral pleural effusions greater on the right.  Cardiomegaly.  MR LUMBAR SPINE FINDINGS  Curvature of the lumbar spine. Superimposed degenerative changes including:  L1-2: Broad-based disc osteophyte complex greater to left. Facet joint degenerative changes and ligamentum flavum hypertrophy. Mild to moderate left-sided and mild right-sided spinal stenosis/ lateral recess stenosis.  L2-3: Broad-based disc osteophyte complex greater left lateral position with slight encroachment upon but not significant compression of the exiting left L2 nerve root. Facet joint degenerative changes and ligamentum flavum hypertrophy greater on left. Moderate to marked left-sided and moderate right-sided spinal stenosis/lateral recess stenosis.  L3-4: Broad-based disc osteophyte complex. Facet joint degenerative changes and ligamentum flavum hypertrophy greater on the right. Moderate to marked right-sided and mild to moderate left-sided lateral recess stenosis. Moderate spinal stenosis with a trefoil appearance and greater on the right.  L4-5: Prominent facet joint degenerative changes without clear pars defect on the left. Right-sided pars defect not excluded. 8 mm anterior slippage of L4. Bulge. Ligamentum flavum hypertrophy. Multifactorial severe spinal stenosis and moderate to marked right-sided with mild moderate left-sided foraminal narrowing and encroachment upon the exiting L4 nerve roots greater on the right.  L5-S1. Moderate facet joint degenerative changes. Minimal anterior slip L5. Bulge/ protrusion greatest centrally and to left. Left greater than right lateral recess stenosis. Mild spinal stenosis in a right to left direction. Mild bilateral foraminal narrowing.  IMPRESSION: MR THORACIC SPINE IMPRESSION  T11 acute/subacute compression  fracture with 50% loss of height. Retropulsion of the posterior superior aspect of the compressed vertebra greater to left of midline with mild flattening of the cord greater to left of midline. The appearance is suggestive of a benign osteoporotic compression fracture given the patient's history of fall. Prior to cement augmentation, CT imaging can be performed to define the full extent of the fracture which appears to extend into the left pedicle.  Nonspecific T3 lesion as detailed above.  Scattered degenerative changes lower cervical and thoracic spine as detailed above.  Bilateral pleural effusions greater on the right.  MR LUMBAR SPINE IMPRESSION  Scoliosis and degenerative changes with various degrees of spinal stenosis and foraminal narrowing as detailed above with most  notable finding the severe multifactorial spinal stenosis at the L4-5 level as detailed above.  No acute lumbar spine or upper sacral fracture.  These results will be called to the ordering clinician or representative by the Radiologist Assistant, and communication documented in the PACS Dashboard.   Electronically Signed   By: Bridgett Larsson M.D.   On: 11/24/2012 08:15   Dg Abd Acute W/chest  11/23/2012   CLINICAL DATA:  Abdominal and back pain.  EXAM: ACUTE ABDOMEN SERIES (ABDOMEN 2 VIEW & CHEST 1 VIEW)  COMPARISON:  Chest radiograph of 03/10/2011. No recent abdominal imaging. A CT of 11/19/2004 is reviewed.  FINDINGS: Frontal view of the chest demonstrates right axillary surgical clips. Cardiomegaly accentuated by AP portable technique. Atherosclerosis in the transverse aorta. Probable small right pleural effusion. No pneumothorax. Moderate interstitial prominence and indistinctness. Patchy right upper and right lower lobe opacities which are suspicious for concurrent airspace disease.  Abdominal films demonstrate no free intraperitoneal air on right-sided decubitus view. Air-fluid level which is likely within the colon. Mild nonspecific  paucity of small bowel gas, especially superiorly. Colonic gas identified. No bowel obstruction. Right proximal femoral fixation. Convex right lumbar spine curvature. Suspected T12 compression deformity, suboptimally evaluated. Marland Kitchen  IMPRESSION: Nonspecific paucity of small bowel gas superiorly. No evidence of free intraperitoneal air or specific evidence of obstruction.  Congestive heart failure with probable small right pleural effusion.  Patchy right-sided airspace disease which could represent alveolar edema or concurrent infection. Consider short-term chest radiographic followup to confirm resolution.  Probable T12 compression deformity.   Electronically Signed   By: Jeronimo Greaves M.D.   On: 11/23/2012 10:29     Microbiology: Recent Results (from the past 240 hour(s))  URINE CULTURE     Status: None   Collection Time    11/23/12  2:48 PM      Result Value Range Status   Specimen Description URINE, CATHETERIZED   Final   Special Requests NONE   Final   Culture  Setup Time     Final   Value: 11/24/2012 01:26     Performed at [facility]   Culture     Final   Value: >=100,000 COLONIES/mL ESCHERICHIA COLI     Performed at [facility]   Report Status 11/25/2012 FINAL   Final   Organism ID, Bacteria ESCHERICHIA COLI   Final  MRSA PCR SCREENING     Status: None   Collection Time    11/23/12  9:29 PM      Result Value Range Status   MRSA by PCR NEGATIVE  NEGATIVE Final   Comment:            The GeneXpert MRSA Assay (FDA     approved for NASAL specimens     only), is one component of a     comprehensive MRSA colonization     surveillance program. It is not     intended to diagnose MRSA     infection nor to guide or     monitor treatment for     MRSA infections.     Labs: Basic Metabolic Panel:  Recent Labs Lab 11/26/12 1000 11/26/12 1430 11/26/12 1800 11/27/12 1600 11/28/12 0520  NA 125* 125* 128* 128* 131*  K 3.2* 3.6 3.5 3.8 3.9  CL 90* 90* 92* 93* 94*   CO2 28 26 28 28 29   GLUCOSE 143* 206* 103* 113* 120*  BUN 4* 5* 8 9 7   CREATININE 0.47* 0.63 0.61  0.52 0.51  CALCIUM 8.3* 8.5 8.8 8.4 8.6   Liver Function Tests:  Recent Labs Lab 11/23/12 0935  AST 28  ALT 20  ALKPHOS 97  BILITOT 0.6  PROT 6.3  ALBUMIN 3.2*    Recent Labs Lab 11/23/12 1705  LIPASE 27   No results found for this basename: AMMONIA,  in the last 168 hours CBC:  Recent Labs Lab 11/23/12 0935 11/23/12 1121 11/24/12 0410 11/27/12 1600 11/28/12 0520  WBC 9.9  --  5.9 11.1* 7.4  NEUTROABS 8.0*  --   --   --   --   HGB 11.1* 11.6* 11.0* 10.2* 10.0*  HCT 30.1* 34.0* 29.9* 29.4* 29.1*  MCV 82.5  --  83.1 88.6 88.7  PLT 262  --  254 315 293   Cardiac Enzymes:  Recent Labs Lab 11/23/12 0935  TROPONINI <0.30   BNP: No components found with this basename: POCBNP,  CBG: No results found for this basename: GLUCAP,  in the last 168 hours  Time coordinating discharge:  Greater than 30 minutes  Signed:  Amberlin Utke, DO [facility] Pager: [phone] 11/28/2012, 1:34 PM

## 2012-11-28 NOTE — Progress Notes (Signed)
Pt for discharge to [facility].  CSW facilitated pt discharge needs including contacting facility, faxing pt discharge information via [facility], discussing with pt and pt daughter at bedside, providing RN phone number to call report, and arranging ambulance transport for pt at 3:30 pm to [facility]. (Service Request ID#: [id number]).  No further social work needs identified at this time.  CSW signing off.   Jacklynn Lewis, MSW, LCSWA  Clinical Social Work [phone]

## 2012-11-30 LAB — BASIC METABOLIC PANEL
Glucose: 110 mg/dL
Potassium: 4.6 mmol/L (ref 3.4–5.3)
Sodium: 133 mmol/L — AB (ref 137–147)

## 2012-11-30 LAB — CBC AND DIFFERENTIAL
HCT: 31 % — AB (ref 36–46)
Hemoglobin: 10.4 g/dL — AB (ref 12.0–16.0)

## 2012-12-01 ENCOUNTER — Encounter: Payer: Self-pay | Admitting: Nurse Practitioner

## 2012-12-01 ENCOUNTER — Non-Acute Institutional Stay (SKILLED_NURSING_FACILITY): Payer: Medicare Other | Admitting: Nurse Practitioner

## 2012-12-01 DIAGNOSIS — D638 Anemia in other chronic diseases classified elsewhere: Secondary | ICD-10-CM

## 2012-12-01 DIAGNOSIS — I5032 Chronic diastolic (congestive) heart failure: Secondary | ICD-10-CM | POA: Insufficient documentation

## 2012-12-01 DIAGNOSIS — I509 Heart failure, unspecified: Secondary | ICD-10-CM

## 2012-12-01 DIAGNOSIS — N390 Urinary tract infection, site not specified: Secondary | ICD-10-CM

## 2012-12-01 DIAGNOSIS — K5900 Constipation, unspecified: Secondary | ICD-10-CM

## 2012-12-01 DIAGNOSIS — J189 Pneumonia, unspecified organism: Secondary | ICD-10-CM

## 2012-12-01 DIAGNOSIS — K589 Irritable bowel syndrome without diarrhea: Secondary | ICD-10-CM

## 2012-12-01 DIAGNOSIS — IMO0002 Reserved for concepts with insufficient information to code with codable children: Secondary | ICD-10-CM

## 2012-12-01 DIAGNOSIS — E871 Hypo-osmolality and hyponatremia: Secondary | ICD-10-CM | POA: Diagnosis not present

## 2012-12-01 DIAGNOSIS — G4700 Insomnia, unspecified: Secondary | ICD-10-CM

## 2012-12-01 DIAGNOSIS — S22000S Wedge compression fracture of unspecified thoracic vertebra, sequela: Secondary | ICD-10-CM

## 2012-12-01 NOTE — Progress Notes (Signed)
Patient ID: Cassandra Glenn, female   DOB: 1920/10/17, 79 y.o.   MRN: [id number]   Code Status: DNR  Allergies  Allergen Reactions  . Macrodantin [Nitrofurantoin Macrocrystal]     unknown  . Septra [Sulfamethoxazole-Trimethoprim]     unknown    Chief Complaint  Patient presents with  . Medical Managment of Chronic Issues    hyponatremia, anemia. constipation, back pain, cold sores lips  . Acute Visit    HPI: Patient is a 79 y.o. female seen in the SNF at [facility] today for evaluation of upper and lower lips cold sores, constipation, back pain, anemia, hyponatremia,  and other chronic medical conditions. Hospitalized 11/23/2012-11/28/2012 for Compression fracture of thoracic vertebra UTI (urinary tract infection) PNA-complete total 10 day course of Cipro   Problem List Items Addressed This Visit   Anemia, chronic disease     Hgb 10.4 MCV and MCH wnl. Continue to monitor     Chronic diastolic CHF (congestive heart failure)     Elevated proBNP 11/23/12 Chest x-ray suggested interstitial prominence Echocardiogram EF 60-65%, grade 1 diastolic dysfunction, mild to moderate TR. Clinically compensated.       Compression fracture of thoracic vertebra     -Likely secondary to mechanical fall 11/16/12 -MRI thoracic and lumbar spine--T-11 acute/subacute compression fracture  -discussed with neurosurgery, Dr. Danie Chandler emergent need for neurosurgical intervention as patient has no leg weakness  -Norco prn pain      HAP (hospital-acquired pneumonia)     No additional antibiotics as the patient is afebrile without leukocytosis or tachycardia, repeat CXR today as pt has increase cough--no infiltrates, mild pulmonary vascular congestion       Hyponatremia - Primary     Hx of hyponatremia range 130-135, N/V/D after Cephalexin started 11/19/12 for UTI, ED Na 104, then 131 upon hospital discharge, repeat serum Na was 133 at SNF [facility]-improved and continue to monitor.     IBS  (irritable bowel syndrome)     No N/V/D since hospital discharge. Now she is constipation, adding Senna I bid    Relevant Medications      senna (SENOKOT) 8.6 MG tablet   Insomnia   Insomnia, unspecified     Prn Ambien needed     Unspecified constipation     Adding Senna I bid.     UTI (urinary tract infection)     plan to d/c on ciprofloxacin 250mg  bid x 5 more days to finish 10 days total. 1st diagnosed at Urgent Care 11/19/12, subsequently Cephalexin started.         Review of Systems:  Review of Systems  Constitutional: Positive for malaise/fatigue. Negative for fever, chills, weight loss and diaphoresis.  HENT: Positive for hearing loss. Negative for congestion, ear discharge, ear pain, nosebleeds, sore throat and tinnitus.   Eyes: Negative for blurred vision, double vision, photophobia, pain, discharge and redness.  Respiratory: Positive for cough. Negative for hemoptysis, sputum production, shortness of breath, wheezing and stridor.   Cardiovascular: Positive for PND. Negative for chest pain, palpitations, orthopnea, claudication and leg swelling.  Gastrointestinal: Positive for constipation. Negative for heartburn, nausea, vomiting, abdominal pain, diarrhea, blood in stool and melena.  Genitourinary: Negative for dysuria, urgency, frequency, hematuria and flank pain.  Musculoskeletal: Positive for back pain and falls. Negative for joint pain, myalgias and neck pain.  Skin: Negative for itching and rash.       Cold sores upper and lower lips.   Neurological: Negative for dizziness, tingling, tremors, sensory change, speech  change, focal weakness, seizures, loss of consciousness, weakness and headaches.  Endo/Heme/Allergies: Negative for environmental allergies and polydipsia. Does not bruise/bleed easily.  Psychiatric/Behavioral: Negative for depression, suicidal ideas, hallucinations, memory loss and substance abuse. The patient has insomnia. The patient is not  nervous/anxious.      Past Medical History  Diagnosis Date  . Cancer   . Macular degeneration   . HOH (hard of hearing)   . Vaginitis and vulvovaginitis 02/29/2012  . Abnormalities of the hair 02/29/2012  . Spinal stenosis, lumbar region, with neurogenic claudication 11/02/2011  . Trigger finger (acquired) 11/02/2011  . Right bundle branch block 08/30/2011  . Edema 05/07/2011  . Contact dermatitis and other eczema, due to unspecified cause 05/07/2011  . Hypopotassemia 03/26/2011  . Candidiasis of skin and nails 03/19/2011  . Hyposmolality and/or hyponatremia 03/19/2011  . Acute posthemorrhagic anemia 03/19/2011  . Diverticulosis of colon (without mention of hemorrhage) 03/19/2011  . Unspecified constipation 03/19/2011  . Pain in joint, pelvic region and thigh 03/19/2011  . Lumbago 03/19/2011  . Spasm of muscle 03/19/2011  . Osteoporosis, unspecified 03/19/2011  . Insomnia, unspecified 03/19/2011  . Other malaise and fatigue 03/19/2011  . Impaired fasting glucose 03/19/2011  . Coronary atherosclerosis of native coronary artery 03/18/2011  . Hypotension, unspecified 03/18/2011  . Closed fracture of base of neck of femur 03/18/2011  . Malignant neoplasm of breast (female), unspecified site 10/27/2004   Past Surgical History  Procedure Laterality Date  . Cataract extraction  2010    bialteral  . Hip pinning,cannulated  03/08/2011    Procedure: CANNULATED HIP PINNING;  Surgeon: Javier Docker, MD;  Location: [facility];  Service: Orthopedics;  Laterality: Right;  . Cholecystectomy    . Eye surgery      cataract  . Abdominal hysterectomy      abd  . Fracture surgery Left 09/1980    ankle  . Breast surgery Bilateral 2005-10/27/2004    lumpectomy- Streck,MD  . Squamous cell carcinoma excision Right 02/04/2011    forearm- Taffeen, MD  . Orif hip fracture Right 03/08/2011    Bean,MD  . Skin lesion excision Right 02/04/2011    Abdomen lesion spongiotic dermatitis-Taffeen, MD  .  Skin cancer excision Left 10/12/2012    lower leg Dr. Hortense Ramal   Social History:   reports that she has never smoked. She has never used smokeless tobacco. She reports that she drinks alcohol. She reports that she does not use illicit drugs.  Family History  Problem Relation Age of Onset  . Heart disease Mother   . Cancer Father   . Emphysema Brother   . Alzheimer's disease Brother     Medications: Patient's Medications  New Prescriptions   No medications on file  Previous Medications   ACETAMINOPHEN (TYLENOL) 500 MG TABLET    Take 500 mg by mouth every 6 (six) hours as needed for pain.   ASPIRIN 81 MG TABLET    Take 81 mg by mouth daily.   BILBERRY 1000 MG CAPS    Take 1 capsule by mouth daily.   CALCIUM CARBONATE-VITAMIN D (CALCIUM 600/VITAMIN D PO)    Take by mouth 2 (two) times daily.   CHOLECALCIFEROL (VITAMIN D) 400 UNITS TABS    Take 400 Units by mouth daily. Take 1 tablet daily for vitamin supplement.   CIPROFLOXACIN (CIPRO) 250 MG TABLET    Take 1 tablet (250 mg total) by mouth 2 (two) times daily. Start 11/29/12   ESTRACE VAGINAL 0.1 MG/GM VAGINAL CREAM  Insert 2 gr vaginally 2-3 times a week at bedtime   FISH OIL-OMEGA-3 FATTY ACIDS 1000 MG CAPSULE    Take 2 g by mouth daily. Take 1 capsule daily for vitamin supplement.   GLUCOSAMINE-VITAMIN D (GLUCOSAMINE PLUS VITAMIN D) 1500-200 MG-UNIT TABS    Take 1 tablet by mouth 2 (two) times daily. Take 1 tablet twice daily for joint health   HYDROCODONE-ACETAMINOPHEN (NORCO/VICODIN) 5-325 MG PER TABLET    Take 1-2 tablets by mouth every 4 (four) hours as needed for moderate pain.   MULTIPLE VITAMINS-MINERALS (PRESERVISION AREDS 2) CAPS    Take 2 capsules by mouth daily.   MULTIPLE VITAMINS-MINERALS (STRESS FORMULA 500/ZINC) TABS    Take 1 tablet by mouth daily.   POLYCARBOPHIL (FIBERCON) 625 MG TABLET    Take 625 mg by mouth daily.   PROBIOTIC PRODUCT (RESTORA) CAPS    Take 1 capsule by mouth daily.   SENNA (SENOKOT) 8.6 MG TABLET     Take 1 tablet by mouth 2 (two) times daily.   ZOLPIDEM (AMBIEN) 5 MG TABLET    Take 5 mg by mouth at bedtime as needed. sleep   ZOLPIDEM (AMBIEN) 5 MG TABLET    Take 1 tablet (5 mg total) by mouth at bedtime as needed for sleep.  Modified Medications   No medications on file  Discontinued Medications   No medications on file     Physical Exam: Physical Exam  Constitutional: She is oriented to person, place, and time. She appears well-developed and well-nourished. No distress.  HENT:  Head: Normocephalic and atraumatic.  Right Ear: External ear normal.  Left Ear: External ear normal.  Nose: Nose normal.  Mouth/Throat: Oropharynx is clear and moist. No oropharyngeal exudate.  Eyes: Conjunctivae and EOM are normal. Pupils are equal, round, and reactive to light. Right eye exhibits no discharge. Left eye exhibits no discharge. No scleral icterus.  Neck: Normal range of motion. Neck supple. No JVD present. No tracheal deviation present. No thyromegaly present.  Cardiovascular: Normal rate, regular rhythm, normal heart sounds and intact distal pulses.   No murmur heard. Pulmonary/Chest: Effort normal and breath sounds normal. No stridor. No respiratory distress. She has no wheezes. She has no rales. She exhibits no tenderness.  Abdominal: Soft. Bowel sounds are normal. She exhibits no distension. There is no tenderness. There is no rebound and no guarding.  Musculoskeletal: Normal range of motion. She exhibits tenderness. She exhibits no edema.  back  Lymphadenopathy:    She has no cervical adenopathy.  Neurological: She is alert and oriented to person, place, and time. She has normal reflexes. She displays normal reflexes. No cranial nerve deficit. She exhibits normal muscle tone. Coordination normal.  Skin: Skin is warm and dry. No rash noted. She is not diaphoretic. No erythema.  Psychiatric: She has a normal mood and affect. Her behavior is normal. Judgment and thought content normal.     Filed Vitals:   12/01/12 2058  BP: 128/72  Pulse: 86  Temp: 98.1 F (36.7 C)  TempSrc: Tympanic  Resp: 20      Labs reviewed: Basic Metabolic Panel:  Recent Labs  16/10/96 1121 11/23/12 1705  11/26/12 1800 11/27/12 1600 11/28/12 0520 11/30/12  NA 104* 100*  < > 128* 128* 131* 133*  K 4.2 3.9  < > 3.5 3.8 3.9 4.6  CL 74* 71*  < > 92* 93* 94*  --   CO2  --  20  < > 28 28 29   --  GLUCOSE 130* 109*  < > 103* 113* 120*  --   BUN 6 6  < > 8 9 7 8   CREATININE 0.60 0.44*  < > 0.61 0.52 0.51 0.6  CALCIUM  --  8.3*  < > 8.8 8.4 8.6  --   TSH  --  0.911  --   --   --   --   --   < > = values in this interval not displayed. Liver Function Tests:  Recent Labs  11/23/12 0935  AST 28  ALT 20  ALKPHOS 97  BILITOT 0.6  PROT 6.3  ALBUMIN 3.2*    Recent Labs  11/23/12 1705  LIPASE 27    CBC:  Recent Labs  11/23/12 0935  11/24/12 0410 11/27/12 1600 11/28/12 0520 11/30/12  WBC 9.9  --  5.9 11.1* 7.4 8.2  NEUTROABS 8.0*  --   --   --   --   --   HGB 11.1*  < > 11.0* 10.2* 10.0* 10.4*  HCT 30.1*  < > 29.9* 29.4* 29.1* 31*  MCV 82.5  --  83.1 88.6 88.7  --   PLT 262  --  254 315 293 321  < > = values in this interval not displayed.  Past Procedures:  11/27/2012 CLINICAL DATA: Cough, short of breath EXAM: CHEST 2 VIEW COMPARISON: Chest radiograph 11/23/2012 FINDINGS:  IMPRESSION: 1. PICC line with tip in the mid SVC. 2. Increasing right pleural effusion. 3. Similar central venous congestion.   11/24/2012 CLINICAL DATA: Back pain with leg weakness. Abnormal x-ray. EXAM: MRI THORACIC AND LUMBAR SPINE WITHOUT CONTRAST IMPRESSION: MR THORACIC SPINE IMPRESSION T11 acute/subacute compression fracture with 50% loss of height. Retropulsion of the posterior superior aspect of the compressed vertebra greater to left of midline with mild flattening of the cord greater to left of midline. The appearance is suggestive of a benign osteoporotic compression fracture given the  patient's history of fall. Prior to cement augmentation, CT imaging can be performed to define the full extent of the fracture which appears to extend into the left pedicle.IMPRESSION Scoliosis and degenerative changes with various degrees of spinal stenosis and foraminal narrowing as detailed above with most notable finding the severe multifactorial spinal stenosis at the L4-5 level as detailed above. No acute lumbar spine or upper sacral fracture.   11/23/2012 CLINICAL DATA: Abdominal and back pain. EXAM: ACUTE ABDOMEN SERIES (ABDOMEN 2 VIEW & CHEST 1 VIEW) IMPRESSION: Nonspecific paucity of small bowel gas superiorly. No evidence of free intraperitoneal air or specific evidence of obstruction. Congestive heart failure with probable small right pleural effusion. Patchy right-sided airspace disease which could represent alveolar edema or concurrent infection. Consider short-term chest radiographic followup to confirm resolution. Probable T12 compression deformity.   Assessment/Plan Hyponatremia Hx of hyponatremia range 130-135, N/V/D after Cephalexin started 11/19/12 for UTI, ED Na 104, then 131 upon hospital discharge, repeat serum Na was 133 at SNF [facility]-improved and continue to monitor.   Anemia, chronic disease Hgb 10.4 MCV and MCH wnl. Continue to monitor   IBS (irritable bowel syndrome) No N/V/D since hospital discharge. Now she is constipation, adding Senna I bid  UTI (urinary tract infection) plan to d/c on ciprofloxacin 250mg  bid x 5 more days to finish 10 days total. 1st diagnosed at Urgent Care 11/19/12, subsequently Cephalexin started.    HAP (hospital-acquired pneumonia) No additional antibiotics as the patient is afebrile without leukocytosis or tachycardia, repeat CXR today as pt has increase cough--no infiltrates, mild pulmonary vascular  congestion     Unspecified constipation Adding Senna I bid.   Chronic diastolic CHF (congestive heart failure) Elevated proBNP 11/23/12 Chest  x-ray suggested interstitial prominence Echocardiogram EF 60-65%, grade 1 diastolic dysfunction, mild to moderate TR. Clinically compensated.     Compression fracture of thoracic vertebra -Likely secondary to mechanical fall 11/16/12 -MRI thoracic and lumbar spine--T-11 acute/subacute compression fracture  -discussed with neurosurgery, Dr. Danie Chandler emergent need for neurosurgical intervention as patient has no leg weakness  -Norco prn pain    Insomnia, unspecified Prn Ambien needed     Family/ Staff Communication: observe the patient.   Goals of Care: SNF  Labs/tests ordered: none

## 2012-12-01 NOTE — Assessment & Plan Note (Signed)
Hgb 10.4 MCV and MCH wnl. Continue to monitor

## 2012-12-01 NOTE — Assessment & Plan Note (Addendum)
Hx of hyponatremia range 130-135, N/V/D after Cephalexin started 11/19/12 for UTI, ED Na 104, then 131 upon hospital discharge, repeat serum Na was 133 at SNF [facility]-improved and continue to monitor.

## 2012-12-01 NOTE — Assessment & Plan Note (Addendum)
plan to d/c on ciprofloxacin 250mg  bid x 5 more days to finish 10 days total. 1st diagnosed at Urgent Care 11/19/12, subsequently Cephalexin started.

## 2012-12-01 NOTE — Assessment & Plan Note (Signed)
Prn Ambien needed    

## 2012-12-01 NOTE — Assessment & Plan Note (Signed)
No additional antibiotics as the patient is afebrile without leukocytosis or tachycardia, repeat CXR today as pt has increase cough--no infiltrates, mild pulmonary vascular congestion

## 2012-12-01 NOTE — Assessment & Plan Note (Signed)
Adding Senna I bid.

## 2012-12-01 NOTE — Assessment & Plan Note (Signed)
No N/V/D since hospital discharge. Now she is constipation, adding Senna I bid

## 2012-12-01 NOTE — Assessment & Plan Note (Addendum)
-  Likely secondary to mechanical fall 11/16/12 -MRI thoracic and lumbar spine--T-11 acute/subacute compression fracture  -discussed with neurosurgery, Dr. Danie Chandler emergent need for neurosurgical intervention as patient has no leg weakness  -Norco prn pain

## 2012-12-01 NOTE — Assessment & Plan Note (Signed)
Elevated proBNP 11/23/12 Chest x-ray suggested interstitial prominence Echocardiogram EF 60-65%, grade 1 diastolic dysfunction, mild to moderate TR. Clinically compensated.      

## 2012-12-15 ENCOUNTER — Encounter: Payer: Self-pay | Admitting: Nurse Practitioner

## 2012-12-15 ENCOUNTER — Non-Acute Institutional Stay (SKILLED_NURSING_FACILITY): Payer: Medicare Other | Admitting: Nurse Practitioner

## 2012-12-15 DIAGNOSIS — I5032 Chronic diastolic (congestive) heart failure: Secondary | ICD-10-CM

## 2012-12-15 DIAGNOSIS — R0781 Pleurodynia: Secondary | ICD-10-CM | POA: Insufficient documentation

## 2012-12-15 DIAGNOSIS — I509 Heart failure, unspecified: Secondary | ICD-10-CM

## 2012-12-15 DIAGNOSIS — G4700 Insomnia, unspecified: Secondary | ICD-10-CM

## 2012-12-15 DIAGNOSIS — R102 Pelvic and perineal pain: Secondary | ICD-10-CM

## 2012-12-15 DIAGNOSIS — D500 Iron deficiency anemia secondary to blood loss (chronic): Secondary | ICD-10-CM

## 2012-12-15 DIAGNOSIS — E871 Hypo-osmolality and hyponatremia: Secondary | ICD-10-CM

## 2012-12-15 DIAGNOSIS — R079 Chest pain, unspecified: Secondary | ICD-10-CM | POA: Diagnosis not present

## 2012-12-15 DIAGNOSIS — N949 Unspecified condition associated with female genital organs and menstrual cycle: Secondary | ICD-10-CM

## 2012-12-15 DIAGNOSIS — L299 Pruritus, unspecified: Secondary | ICD-10-CM

## 2012-12-15 NOTE — Assessment & Plan Note (Signed)
Likely dry skin dermatitis--the right shoulder blade area--apply Triamcinolone bid for 2 weeks.

## 2012-12-15 NOTE — Assessment & Plan Note (Signed)
Anterior left lower rib cage pain palpated, will obtain X-ray of the left ribs. Pain is managed with Hydrocodone prn.

## 2012-12-15 NOTE — Assessment & Plan Note (Signed)
Hgb 10.4 MCV and MCH wnl. Continue to monitor. Update CBC

## 2012-12-15 NOTE — Progress Notes (Signed)
Patient ID: CORLEY KOHLS, female   DOB: [birth year], 79 y.o.   MRN: [id number]   Code Status: DNR  Allergies  Allergen Reactions  . Macrodantin [Nitrofurantoin Macrocrystal]     unknown  . Septra [Sulfamethoxazole-Trimethoprim]     unknown    Chief Complaint  Patient presents with  . Medical Managment of Chronic Issues    right shoulder blade itching, left lower rib cage pain.   . Acute Visit    HPI: Patient is a 79 y.o. female seen in the SNF at [facility] today for evaluation of right shoulder blade itching, anterior left lower rib cage pain, back pain, anemia, hyponatremia,  and other chronic medical conditions. Hospitalized 11/23/2012-11/28/2012 for Compression fracture of thoracic vertebra   Problem List Items Addressed This Visit   Normocytic anemia due to blood loss - Primary     Hgb 10.4 MCV and MCH wnl. Continue to monitor. Update CBC      Insomnia, unspecified     Prn Ambien needed       Vaginal pain     Adding Senna I bid.       Hyponatremia     Hx of hyponatremia range 130-135, N/V/D after Cephalexin started 11/19/12 for UTI, ED Na 104, then 131 upon hospital discharge, repeat serum Na was 133 at SNF [facility]-improved and continue to monitor. Update CMP      Chronic diastolic CHF (congestive heart failure)     Elevated proBNP 11/23/12 Chest x-ray suggested interstitial prominence Echocardiogram EF 60-65%, grade 1 diastolic dysfunction, mild to moderate TR. Clinically compensated.         Rib pain on left side     Anterior left lower rib cage pain palpated, will obtain X-ray of the left ribs. Pain is managed with Hydrocodone prn.     Pruritus     Likely dry skin dermatitis--the right shoulder blade area--apply Triamcinolone bid for 2 weeks.        Review of Systems:  Review of Systems  Constitutional: Positive for malaise/fatigue. Negative for fever, chills, weight loss and diaphoresis.  HENT: Positive for hearing loss. Negative for  congestion, ear discharge, ear pain, nosebleeds, sore throat and tinnitus.   Eyes: Negative for blurred vision, double vision, photophobia, pain, discharge and redness.  Respiratory: Positive for cough. Negative for hemoptysis, sputum production, shortness of breath, wheezing and stridor.   Cardiovascular: Positive for PND. Negative for chest pain, palpitations, orthopnea, claudication and leg swelling.  Gastrointestinal: Positive for constipation. Negative for heartburn, nausea, vomiting, abdominal pain, diarrhea, blood in stool and melena.  Genitourinary: Negative for dysuria, urgency, frequency, hematuria and flank pain.  Musculoskeletal: Positive for back pain and falls. Negative for joint pain, myalgias and neck pain.       Anterior left lower rib cage pain palpated.   Skin: Negative for itching and rash.       Right shoulder blade skin itching.   Neurological: Negative for dizziness, tingling, tremors, sensory change, speech change, focal weakness, seizures, loss of consciousness, weakness and headaches.  Endo/Heme/Allergies: Negative for environmental allergies and polydipsia. Does not bruise/bleed easily.  Psychiatric/Behavioral: Negative for depression, suicidal ideas, hallucinations, memory loss and substance abuse. The patient has insomnia. The patient is not nervous/anxious.      Past Medical History  Diagnosis Date  . Cancer   . Macular degeneration   . HOH (hard of hearing)   . Vaginitis and vulvovaginitis 02/29/2012  . Abnormalities of the hair 02/29/2012  . Spinal stenosis, lumbar  region, with neurogenic claudication 11/02/2011  . Trigger finger (acquired) 11/02/2011  . Right bundle branch block 08/30/2011  . Edema 05/07/2011  . Contact dermatitis and other eczema, due to unspecified cause 05/07/2011  . Hypopotassemia 03/26/2011  . Candidiasis of skin and nails 03/19/2011  . Hyposmolality and/or hyponatremia 03/19/2011  . Acute posthemorrhagic anemia 03/19/2011  .  Diverticulosis of colon (without mention of hemorrhage) 03/19/2011  . Unspecified constipation 03/19/2011  . Pain in joint, pelvic region and thigh 03/19/2011  . Lumbago 03/19/2011  . Spasm of muscle 03/19/2011  . Osteoporosis, unspecified 03/19/2011  . Insomnia, unspecified 03/19/2011  . Other malaise and fatigue 03/19/2011  . Impaired fasting glucose 03/19/2011  . Coronary atherosclerosis of native coronary artery 03/18/2011  . Hypotension, unspecified 03/18/2011  . Closed fracture of base of neck of femur 03/18/2011  . Malignant neoplasm of breast (female), unspecified site 10/27/2004   Past Surgical History  Procedure Laterality Date  . Cataract extraction  2010    bialteral  . Hip pinning,cannulated  03/08/2011    Procedure: CANNULATED HIP PINNING;  Surgeon: Javier Docker, MD;  Location: [facility];  Service: Orthopedics;  Laterality: Right;  . Cholecystectomy    . Eye surgery      cataract  . Abdominal hysterectomy      abd  . Fracture surgery Left 09/1980    ankle  . Breast surgery Bilateral 2005-10/27/2004    lumpectomy- Streck,MD  . Squamous cell carcinoma excision Right 02/04/2011    forearm- Taffeen, MD  . Orif hip fracture Right 03/08/2011    Bean,MD  . Skin lesion excision Right 02/04/2011    Abdomen lesion spongiotic dermatitis-Taffeen, MD  . Skin cancer excision Left 10/12/2012    lower leg Dr. Hortense Ramal   Social History:   reports that she has never smoked. She has never used smokeless tobacco. She reports that she drinks alcohol. She reports that she does not use illicit drugs.  Family History  Problem Relation Age of Onset  . Heart disease Mother   . Cancer Father   . Emphysema Brother   . Alzheimer's disease Brother     Medications: Patient's Medications  New Prescriptions   No medications on file  Previous Medications   ACETAMINOPHEN (TYLENOL) 500 MG TABLET    Take 500 mg by mouth every 6 (six) hours as needed for pain.   ASPIRIN 81 MG TABLET     Take 81 mg by mouth daily.   BILBERRY 1000 MG CAPS    Take 1 capsule by mouth daily.   CALCIUM CARBONATE-VITAMIN D (CALCIUM 600/VITAMIN D PO)    Take by mouth 2 (two) times daily.   CHOLECALCIFEROL (VITAMIN D) 400 UNITS TABS    Take 400 Units by mouth daily. Take 1 tablet daily for vitamin supplement.   CIPROFLOXACIN (CIPRO) 250 MG TABLET    Take 1 tablet (250 mg total) by mouth 2 (two) times daily. Start 11/29/12   ESTRACE VAGINAL 0.1 MG/GM VAGINAL CREAM    Insert 2 gr vaginally 2-3 times a week at bedtime   FISH OIL-OMEGA-3 FATTY ACIDS 1000 MG CAPSULE    Take 2 g by mouth daily. Take 1 capsule daily for vitamin supplement.   GLUCOSAMINE-VITAMIN D (GLUCOSAMINE PLUS VITAMIN D) 1500-200 MG-UNIT TABS    Take 1 tablet by mouth 2 (two) times daily. Take 1 tablet twice daily for joint health   HYDROCODONE-ACETAMINOPHEN (NORCO/VICODIN) 5-325 MG PER TABLET    Take 1-2 tablets by mouth every 4 (four) hours  as needed for moderate pain.   MULTIPLE VITAMINS-MINERALS (PRESERVISION AREDS 2) CAPS    Take 2 capsules by mouth daily.   MULTIPLE VITAMINS-MINERALS (STRESS FORMULA 500/ZINC) TABS    Take 1 tablet by mouth daily.   POLYCARBOPHIL (FIBERCON) 625 MG TABLET    Take 625 mg by mouth daily.   PROBIOTIC PRODUCT (RESTORA) CAPS    Take 1 capsule by mouth daily.   SENNA (SENOKOT) 8.6 MG TABLET    Take 1 tablet by mouth 2 (two) times daily.   ZOLPIDEM (AMBIEN) 5 MG TABLET    Take 5 mg by mouth at bedtime as needed. sleep   ZOLPIDEM (AMBIEN) 5 MG TABLET    Take 1 tablet (5 mg total) by mouth at bedtime as needed for sleep.  Modified Medications   No medications on file  Discontinued Medications   No medications on file     Physical Exam: Physical Exam  Constitutional: She is oriented to person, place, and time. She appears well-developed and well-nourished. No distress.  HENT:  Head: Normocephalic and atraumatic.  Right Ear: External ear normal.  Left Ear: External ear normal.  Nose: Nose normal.    Mouth/Throat: Oropharynx is clear and moist. No oropharyngeal exudate.  Eyes: Conjunctivae and EOM are normal. Pupils are equal, round, and reactive to light. Right eye exhibits no discharge. Left eye exhibits no discharge. No scleral icterus.  Neck: Normal range of motion. Neck supple. No JVD present. No tracheal deviation present. No thyromegaly present.  Cardiovascular: Normal rate, regular rhythm, normal heart sounds and intact distal pulses.   No murmur heard. Pulmonary/Chest: Effort normal and breath sounds normal. No stridor. No respiratory distress. She has no wheezes. She has no rales. She exhibits no tenderness.  Abdominal: Soft. Bowel sounds are normal. She exhibits no distension. There is no tenderness. There is no rebound and no guarding.  Musculoskeletal: Normal range of motion. She exhibits tenderness. She exhibits no edema.  Back. Palpated the anterior left lower rib cage pain.   Lymphadenopathy:    She has no cervical adenopathy.  Neurological: She is alert and oriented to person, place, and time. She has normal reflexes. No cranial nerve deficit. She exhibits normal muscle tone. Coordination normal.  Skin: Skin is warm and dry. No rash noted. She is not diaphoretic. No erythema.  Scaly area on the right shoulder blade.   Psychiatric: She has a normal mood and affect. Her behavior is normal. Judgment and thought content normal.    Filed Vitals:   12/15/12 1753  BP: 126/53  Pulse: 81  Temp: 97.5 F (36.4 C)  TempSrc: Tympanic  Resp: 18      Labs reviewed: Basic Metabolic Panel:  Recent Labs  16/10/96 1121 11/23/12 1705  11/26/12 1800 11/27/12 1600 11/28/12 0520 11/30/12  NA 104* 100*  < > 128* 128* 131* 133*  K 4.2 3.9  < > 3.5 3.8 3.9 4.6  CL 74* 71*  < > 92* 93* 94*  --   CO2  --  20  < > 28 28 29   --   GLUCOSE 130* 109*  < > 103* 113* 120*  --   BUN 6 6  < > 8 9 7 8   CREATININE 0.60 0.44*  < > 0.61 0.52 0.51 0.6  CALCIUM  --  8.3*  < > 8.8 8.4 8.6   --   TSH  --  0.911  --   --   --   --   --   < > =  values in this interval not displayed. Liver Function Tests:  Recent Labs  11/23/12 0935  AST 28  ALT 20  ALKPHOS 97  BILITOT 0.6  PROT 6.3  ALBUMIN 3.2*    Recent Labs  11/23/12 1705  LIPASE 27    CBC:  Recent Labs  11/23/12 0935  11/24/12 0410 11/27/12 1600 11/28/12 0520 11/30/12  WBC 9.9  --  5.9 11.1* 7.4 8.2  NEUTROABS 8.0*  --   --   --   --   --   HGB 11.1*  < > 11.0* 10.2* 10.0* 10.4*  HCT 30.1*  < > 29.9* 29.4* 29.1* 31*  MCV 82.5  --  83.1 88.6 88.7  --   PLT 262  --  254 315 293 321  < > = values in this interval not displayed.  Past Procedures:  11/27/2012 CLINICAL DATA: Cough, short of breath EXAM: CHEST 2 VIEW COMPARISON: Chest radiograph 11/23/2012 FINDINGS:  IMPRESSION: 1. PICC line with tip in the mid SVC. 2. Increasing right pleural effusion. 3. Similar central venous congestion.   11/24/2012 CLINICAL DATA: Back pain with leg weakness. Abnormal x-ray. EXAM: MRI THORACIC AND LUMBAR SPINE WITHOUT CONTRAST IMPRESSION: MR THORACIC SPINE IMPRESSION T11 acute/subacute compression fracture with 50% loss of height. Retropulsion of the posterior superior aspect of the compressed vertebra greater to left of midline with mild flattening of the cord greater to left of midline. The appearance is suggestive of a benign osteoporotic compression fracture given the patient's history of fall. Prior to cement augmentation, CT imaging can be performed to define the full extent of the fracture which appears to extend into the left pedicle.IMPRESSION Scoliosis and degenerative changes with various degrees of spinal stenosis and foraminal narrowing as detailed above with most notable finding the severe multifactorial spinal stenosis at the L4-5 level as detailed above. No acute lumbar spine or upper sacral fracture.   11/23/2012 CLINICAL DATA: Abdominal and back pain. EXAM: ACUTE ABDOMEN SERIES (ABDOMEN 2 VIEW & CHEST 1 VIEW)  IMPRESSION: Nonspecific paucity of small bowel gas superiorly. No evidence of free intraperitoneal air or specific evidence of obstruction. Congestive heart failure with probable small right pleural effusion. Patchy right-sided airspace disease which could represent alveolar edema or concurrent infection. Consider short-term chest radiographic followup to confirm resolution. Probable T12 compression deformity.   Assessment/Plan Normocytic anemia due to blood loss Hgb 10.4 MCV and MCH wnl. Continue to monitor. Update CBC    Insomnia, unspecified Prn Ambien needed     Hyponatremia Hx of hyponatremia range 130-135, N/V/D after Cephalexin started 11/19/12 for UTI, ED Na 104, then 131 upon hospital discharge, repeat serum Na was 133 at SNF [facility]-improved and continue to monitor. Update CMP    Rib pain on left side Anterior left lower rib cage pain palpated, will obtain X-ray of the left ribs. Pain is managed with Hydrocodone prn.   Pruritus Likely dry skin dermatitis--the right shoulder blade area--apply Triamcinolone bid for 2 weeks.   Vaginal pain Adding Senna I bid.     Chronic diastolic CHF (congestive heart failure) Elevated proBNP 11/23/12 Chest x-ray suggested interstitial prominence Echocardiogram EF 60-65%, grade 1 diastolic dysfunction, mild to moderate TR. Clinically compensated.         Family/ Staff Communication: observe the patient.   Goals of Care: IL  Labs/tests ordered: CBC and CMP

## 2012-12-15 NOTE — Assessment & Plan Note (Signed)
Adding Senna I bid.  

## 2012-12-15 NOTE — Assessment & Plan Note (Signed)
Elevated proBNP 11/23/12 Chest x-ray suggested interstitial prominence Echocardiogram EF 60-65%, grade 1 diastolic dysfunction, mild to moderate TR. Clinically compensated.

## 2012-12-15 NOTE — Assessment & Plan Note (Signed)
Hx of hyponatremia range 130-135, N/V/D after Cephalexin started 11/19/12 for UTI, ED Na 104, then 131 upon hospital discharge, repeat serum Na was 133 at SNF [facility]-improved and continue to monitor. Update CMP

## 2012-12-15 NOTE — Assessment & Plan Note (Signed)
Prn Ambien needed    

## 2012-12-18 LAB — HEPATIC FUNCTION PANEL
ALT: 10 U/L (ref 7–35)
AST: 15 U/L (ref 13–35)
Alkaline Phosphatase: 101 U/L (ref 25–125)
Bilirubin, Total: 0.3 mg/dL

## 2012-12-18 LAB — BASIC METABOLIC PANEL
Creatinine: 0.6 mg/dL (ref 0.5–1.1)
Sodium: 136 mmol/L — AB (ref 137–147)

## 2012-12-18 LAB — CBC AND DIFFERENTIAL
HCT: 34 % — AB (ref 36–46)
Hemoglobin: 11.3 g/dL — AB (ref 12.0–16.0)
Platelets: 298 10*3/uL (ref 150–399)
WBC: 5.1 10^3/mL

## 2012-12-19 ENCOUNTER — Encounter: Payer: Self-pay | Admitting: Nurse Practitioner

## 2012-12-19 ENCOUNTER — Non-Acute Institutional Stay (SKILLED_NURSING_FACILITY): Payer: Medicare Other | Admitting: Nurse Practitioner

## 2012-12-19 DIAGNOSIS — R42 Dizziness and giddiness: Secondary | ICD-10-CM | POA: Diagnosis not present

## 2012-12-19 DIAGNOSIS — D638 Anemia in other chronic diseases classified elsewhere: Secondary | ICD-10-CM

## 2012-12-19 DIAGNOSIS — G4700 Insomnia, unspecified: Secondary | ICD-10-CM

## 2012-12-19 DIAGNOSIS — R079 Chest pain, unspecified: Secondary | ICD-10-CM

## 2012-12-19 DIAGNOSIS — E871 Hypo-osmolality and hyponatremia: Secondary | ICD-10-CM | POA: Diagnosis not present

## 2012-12-19 DIAGNOSIS — M48062 Spinal stenosis, lumbar region with neurogenic claudication: Secondary | ICD-10-CM

## 2012-12-19 DIAGNOSIS — R0781 Pleurodynia: Secondary | ICD-10-CM

## 2012-12-19 NOTE — Assessment & Plan Note (Signed)
-  Likely secondary to mechanical fall 11/16/12 -MRI thoracic and lumbar spine--T-11 acute/subacute compression fracture  -discussed with neurosurgery, Dr. Jenkins--no emergent need for neurosurgical intervention as patient has no leg weakness  -Norco prn pain   

## 2012-12-19 NOTE — Assessment & Plan Note (Signed)
Prn Ambien needed

## 2012-12-19 NOTE — Assessment & Plan Note (Signed)
Hgb 11.8 12/18/12.

## 2012-12-19 NOTE — Assessment & Plan Note (Signed)
Resolved, last Na 13612/8/14

## 2012-12-19 NOTE — Assessment & Plan Note (Signed)
No further complaint. X-ray 12/16/12 L ribs: no evidence of rib fracture can be identified.

## 2012-12-19 NOTE — Assessment & Plan Note (Signed)
CBC and CMP unremarkable 12/18/12. Episodes x2 lasted a few seconds and resolved w/o intervention. Cath UA and C/S in am. Continue to observe the patient. May consider Carotid artery Doppler and CT of head if persists.

## 2012-12-19 NOTE — Progress Notes (Signed)
Patient ID: Cassandra Glenn, female   DOB: February 05, 1920, 79 y.o.   MRN: [id number]   Code Status: DNR  Allergies  Allergen Reactions  . Macrodantin [Nitrofurantoin Macrocrystal]     unknown  . Septra [Sulfamethoxazole-Trimethoprim]     unknown    Chief Complaint  Patient presents with  . Medical Managment of Chronic Issues    dzzy spell x2  . Acute Visit    HPI: Patient is a 79 y.o. female seen in the SNF at [facility] today for evaluation of dizzy spell x2 and other chronic medical conditions.  Problem List Items Addressed This Visit   Spinal stenosis, lumbar region, with neurogenic claudication     -Likely secondary to mechanical fall 11/16/12 -MRI thoracic and lumbar spine--T-11 acute/subacute compression fracture  -discussed with neurosurgery, Dr. Danie Chandler emergent need for neurosurgical intervention as patient has no leg weakness  -Norco prn pain        Hyponatremia     Resolved, last Na 13612/8/14    Anemia, chronic disease     Hgb 11.8 12/18/12.       Insomnia - Primary     Prn Ambien needed       Rib pain on left side     No further complaint. X-ray 12/16/12 L ribs: no evidence of rib fracture can be identified.     Dizziness and giddiness     CBC and CMP unremarkable 12/18/12. Episodes x2 lasted a few seconds and resolved w/o intervention. Cath UA and C/S in am. Continue to observe the patient. May consider Carotid artery Doppler and CT of head if persists.        Review of Systems:  Review of Systems  Constitutional: Positive for malaise/fatigue. Negative for fever, chills, weight loss and diaphoresis.  HENT: Positive for hearing loss. Negative for congestion, ear discharge, ear pain, nosebleeds, sore throat and tinnitus.   Eyes: Negative for blurred vision, double vision, photophobia, pain, discharge and redness.  Respiratory: Positive for cough. Negative for hemoptysis, sputum production, shortness of breath, wheezing and stridor.     Cardiovascular: Positive for PND. Negative for chest pain, palpitations, orthopnea, claudication and leg swelling.  Gastrointestinal: Positive for constipation. Negative for heartburn, nausea, vomiting, abdominal pain, diarrhea, blood in stool and melena.  Genitourinary: Negative for dysuria, urgency, frequency, hematuria and flank pain.  Musculoskeletal: Positive for back pain and falls. Negative for joint pain, myalgias and neck pain.       Anterior left lower rib cage pain palpated.   Skin: Negative for itching and rash.       Right shoulder blade skin itching.   Neurological: Positive for dizziness. Negative for tingling, tremors, sensory change, speech change, focal weakness, seizures, loss of consciousness, weakness and headaches.       X2 in the past 2 days. 12/17/12 after dinner, lightheaded, lasted a few seconds, no chest pain/palpitation/SOB/HA/N/V/D accompanied her symptoms. Staff reported no focal neurological deficits. Resolved w/o intervention. 12/18/12 similar episode. 12/19/12 no recurrence of dizziness/light headedness.   Endo/Heme/Allergies: Negative for environmental allergies and polydipsia. Does not bruise/bleed easily.  Psychiatric/Behavioral: Negative for depression, suicidal ideas, hallucinations, memory loss and substance abuse. The patient has insomnia. The patient is not nervous/anxious.      Past Medical History  Diagnosis Date  . Cancer   . Macular degeneration   . HOH (hard of hearing)   . Vaginitis and vulvovaginitis 02/29/2012  . Abnormalities of the hair 02/29/2012  . Spinal stenosis, lumbar region, with neurogenic claudication  11/02/2011  . Trigger finger (acquired) 11/02/2011  . Right bundle branch block 08/30/2011  . Edema 05/07/2011  . Contact dermatitis and other eczema, due to unspecified cause 05/07/2011  . Hypopotassemia 03/26/2011  . Candidiasis of skin and nails 03/19/2011  . Hyposmolality and/or hyponatremia 03/19/2011  . Acute posthemorrhagic  anemia 03/19/2011  . Diverticulosis of colon (without mention of hemorrhage) 03/19/2011  . Unspecified constipation 03/19/2011  . Pain in joint, pelvic region and thigh 03/19/2011  . Lumbago 03/19/2011  . Spasm of muscle 03/19/2011  . Osteoporosis, unspecified 03/19/2011  . Insomnia, unspecified 03/19/2011  . Other malaise and fatigue 03/19/2011  . Impaired fasting glucose 03/19/2011  . Coronary atherosclerosis of native coronary artery 03/18/2011  . Hypotension, unspecified 03/18/2011  . Closed fracture of base of neck of femur 03/18/2011  . Malignant neoplasm of breast (female), unspecified site 10/27/2004   Past Surgical History  Procedure Laterality Date  . Cataract extraction  2010    bialteral  . Hip pinning,cannulated  03/08/2011    Procedure: CANNULATED HIP PINNING;  Surgeon: Javier Docker, MD;  Location: [facility];  Service: Orthopedics;  Laterality: Right;  . Cholecystectomy    . Eye surgery      cataract  . Abdominal hysterectomy      abd  . Fracture surgery Left 09/1980    ankle  . Breast surgery Bilateral 2005-10/27/2004    lumpectomy- Streck,MD  . Squamous cell carcinoma excision Right 02/04/2011    forearm- Taffeen, MD  . Orif hip fracture Right 03/08/2011    Bean,MD  . Skin lesion excision Right 02/04/2011    Abdomen lesion spongiotic dermatitis-Taffeen, MD  . Skin cancer excision Left 10/12/2012    lower leg Dr. Hortense Ramal   Social History:   reports that she has never smoked. She has never used smokeless tobacco. She reports that she drinks alcohol. She reports that she does not use illicit drugs.  Family History  Problem Relation Age of Onset  . Heart disease Mother   . Cancer Father   . Emphysema Brother   . Alzheimer's disease Brother     Medications: Patient's Medications  New Prescriptions   No medications on file  Previous Medications   ACETAMINOPHEN (TYLENOL) 500 MG TABLET    Take 500 mg by mouth every 6 (six) hours as needed for pain.    ASPIRIN 81 MG TABLET    Take 81 mg by mouth daily.   BILBERRY 1000 MG CAPS    Take 1 capsule by mouth daily.   CALCIUM CARBONATE-VITAMIN D (CALCIUM 600/VITAMIN D PO)    Take by mouth 2 (two) times daily.   CHOLECALCIFEROL (VITAMIN D) 400 UNITS TABS    Take 400 Units by mouth daily. Take 1 tablet daily for vitamin supplement.   CIPROFLOXACIN (CIPRO) 250 MG TABLET    Take 1 tablet (250 mg total) by mouth 2 (two) times daily. Start 11/29/12   ESTRACE VAGINAL 0.1 MG/GM VAGINAL CREAM    Insert 2 gr vaginally 2-3 times a week at bedtime   FISH OIL-OMEGA-3 FATTY ACIDS 1000 MG CAPSULE    Take 2 g by mouth daily. Take 1 capsule daily for vitamin supplement.   GLUCOSAMINE-VITAMIN D (GLUCOSAMINE PLUS VITAMIN D) 1500-200 MG-UNIT TABS    Take 1 tablet by mouth 2 (two) times daily. Take 1 tablet twice daily for joint health   HYDROCODONE-ACETAMINOPHEN (NORCO/VICODIN) 5-325 MG PER TABLET    Take 1-2 tablets by mouth every 4 (four) hours as needed for moderate  pain.   MULTIPLE VITAMINS-MINERALS (PRESERVISION AREDS 2) CAPS    Take 2 capsules by mouth daily.   MULTIPLE VITAMINS-MINERALS (STRESS FORMULA 500/ZINC) TABS    Take 1 tablet by mouth daily.   POLYCARBOPHIL (FIBERCON) 625 MG TABLET    Take 625 mg by mouth daily.   PROBIOTIC PRODUCT (RESTORA) CAPS    Take 1 capsule by mouth daily.   SENNA (SENOKOT) 8.6 MG TABLET    Take 1 tablet by mouth 2 (two) times daily.   ZOLPIDEM (AMBIEN) 5 MG TABLET    Take 5 mg by mouth at bedtime as needed. sleep   ZOLPIDEM (AMBIEN) 5 MG TABLET    Take 1 tablet (5 mg total) by mouth at bedtime as needed for sleep.  Modified Medications   No medications on file  Discontinued Medications   No medications on file     Physical Exam: Physical Exam  Constitutional: She is oriented to person, place, and time. She appears well-developed and well-nourished. No distress.  HENT:  Head: Normocephalic and atraumatic.  Right Ear: External ear normal.  Left Ear: External ear normal.    Nose: Nose normal.  Mouth/Throat: Oropharynx is clear and moist. No oropharyngeal exudate.  Eyes: Conjunctivae and EOM are normal. Pupils are equal, round, and reactive to light. Right eye exhibits no discharge. Left eye exhibits no discharge. No scleral icterus.  Neck: Normal range of motion. Neck supple. No JVD present. No tracheal deviation present. No thyromegaly present.  Cardiovascular: Normal rate, regular rhythm, normal heart sounds and intact distal pulses.   No murmur heard. Pulmonary/Chest: Effort normal and breath sounds normal. No stridor. No respiratory distress. She has no wheezes. She has no rales. She exhibits no tenderness.  Abdominal: Soft. Bowel sounds are normal. She exhibits no distension. There is no tenderness. There is no rebound and no guarding.  Musculoskeletal: Normal range of motion. She exhibits tenderness. She exhibits no edema.  Back. Palpated the anterior left lower rib cage pain.   Lymphadenopathy:    She has no cervical adenopathy.  Neurological: She is alert and oriented to person, place, and time. She has normal reflexes. No cranial nerve deficit. She exhibits normal muscle tone. Coordination normal.  Skin: Skin is warm and dry. No rash noted. She is not diaphoretic. No erythema.  Scaly area on the right shoulder blade.   Psychiatric: She has a normal mood and affect. Her behavior is normal. Judgment and thought content normal.    Filed Vitals:   12/19/12 1418  BP: 138/68  Pulse: 88  Temp: 97.8 F (36.6 C)  TempSrc: Tympanic  Resp: 20      Labs reviewed: Basic Metabolic Panel:  Recent Labs  16/10/96 1121 11/23/12 1705  11/26/12 1800 11/27/12 1600 11/28/12 0520 11/30/12 12/18/12  NA 104* 100*  < > 128* 128* 131* 133* 136*  K 4.2 3.9  < > 3.5 3.8 3.9 4.6 4.4  CL 74* 71*  < > 92* 93* 94*  --   --   CO2  --  20  < > 28 28 29   --   --   GLUCOSE 130* 109*  < > 103* 113* 120*  --   --   BUN 6 6  < > 8 9 7 8 9   CREATININE 0.60 0.44*  < >  0.61 0.52 0.51 0.6 0.6  CALCIUM  --  8.3*  < > 8.8 8.4 8.6  --   --   TSH  --  0.911  --   --   --   --   --   --   < > =  values in this interval not displayed. Liver Function Tests:  Recent Labs  11/23/12 0935 12/18/12  AST 28 15  ALT 20 10  ALKPHOS 97 101  BILITOT 0.6  --   PROT 6.3  --   ALBUMIN 3.2*  --     Recent Labs  11/23/12 1705  LIPASE 27   CBC:  Recent Labs  11/23/12 0935  11/24/12 0410 11/27/12 1600 11/28/12 0520 11/30/12 12/18/12  WBC 9.9  --  5.9 11.1* 7.4 8.2 5.1  NEUTROABS 8.0*  --   --   --   --   --   --   HGB 11.1*  < > 11.0* 10.2* 10.0* 10.4* 11.3*  HCT 30.1*  < > 29.9* 29.4* 29.1* 31* 34*  MCV 82.5  --  83.1 88.6 88.7  --   --   PLT 262  --  254 315 293 321 298  < > = values in this interval not displayed.  Past Procedures:  11/27/2012 CLINICAL DATA: Cough, short of breath EXAM: CHEST 2 VIEW COMPARISON: Chest radiograph 11/23/2012 FINDINGS: IMPRESSION: 1. PICC line with tip in the mid SVC. 2. Increasing right pleural effusion. 3. Similar central venous congestion.   11/24/2012 CLINICAL DATA: Back pain with leg weakness. Abnormal x-ray. EXAM: MRI THORACIC AND LUMBAR SPINE WITHOUT CONTRAST IMPRESSION: MR THORACIC SPINE IMPRESSION T11 acute/subacute compression fracture with 50% loss of height. Retropulsion of the posterior superior aspect of the compressed vertebra greater to left of midline with mild flattening of the cord greater to left of midline. The appearance is suggestive of a benign osteoporotic compression fracture given the patient's history of fall. Prior to cement augmentation, CT imaging can be performed to define the full extent of the fracture which appears to extend into the left pedicle.IMPRESSION Scoliosis and degenerative changes with various degrees of spinal stenosis and foraminal narrowing as detailed above with most notable finding the severe multifactorial spinal stenosis at the L4-5 level as detailed above. No acute lumbar spine  or upper sacral fracture.   11/23/2012 CLINICAL DATA: Abdominal and back pain. EXAM: ACUTE ABDOMEN SERIES (ABDOMEN 2 VIEW & CHEST 1 VIEW) IMPRESSION: Nonspecific paucity of small bowel gas superiorly. No evidence of free intraperitoneal air or specific evidence of obstruction. Congestive heart failure with probable small right pleural effusion. Patchy right-sided airspace disease which could represent alveolar edema or concurrent infection. Consider short-term chest radiographic followup to confirm resolution. Probable T12 compression deformity.       Assessment/Plan Insomnia Prn Ambien needed     Hyponatremia Resolved, last Na 13612/8/14  Anemia, chronic disease Hgb 11.8 12/18/12.     Rib pain on left side No further complaint. X-ray 12/16/12 L ribs: no evidence of rib fracture can be identified.   Spinal stenosis, lumbar region, with neurogenic claudication -Likely secondary to mechanical fall 11/16/12 -MRI thoracic and lumbar spine--T-11 acute/subacute compression fracture  -discussed with neurosurgery, Dr. Danie Chandler emergent need for neurosurgical intervention as patient has no leg weakness  -Norco prn pain      Dizziness and giddiness CBC and CMP unremarkable 12/18/12. Episodes x2 lasted a few seconds and resolved w/o intervention. Cath UA and C/S in am. Continue to observe the patient. May consider Carotid artery Doppler and CT of head if persists.     Family/ Staff Communication: observe the patient.   Goals of Care: [place]  Labs/tests ordered: Cath UA C/S in am.

## 2012-12-20 ENCOUNTER — Telehealth: Payer: Self-pay | Admitting: *Deleted

## 2012-12-20 NOTE — Telephone Encounter (Signed)
Patient daughter states she fell early November and the GYN had checked to see if she had a UTI and she did. Daughter wants her Urine checked regularly for this. Patient had a "woozy" spell on Sunday and they are nervous that it is a recurring UTI. Daughter really wants this done and if they have to pay out of pocket they will.  Also when in the hospital they told her she had CHF and they want to know what that means to them because that is the first they heard of that. Daughter does not want this discuss about the CHF in front of her. They have a careteam meeting tomorrow at [facility] .Please Advise

## 2013-01-08 DIAGNOSIS — R269 Unspecified abnormalities of gait and mobility: Secondary | ICD-10-CM | POA: Diagnosis not present

## 2013-01-08 DIAGNOSIS — R262 Difficulty in walking, not elsewhere classified: Secondary | ICD-10-CM | POA: Diagnosis not present

## 2013-01-08 DIAGNOSIS — M6281 Muscle weakness (generalized): Secondary | ICD-10-CM | POA: Diagnosis not present

## 2013-01-08 DIAGNOSIS — R279 Unspecified lack of coordination: Secondary | ICD-10-CM | POA: Diagnosis not present

## 2013-01-08 DIAGNOSIS — IMO0002 Reserved for concepts with insufficient information to code with codable children: Secondary | ICD-10-CM | POA: Diagnosis not present

## 2013-01-09 DIAGNOSIS — IMO0002 Reserved for concepts with insufficient information to code with codable children: Secondary | ICD-10-CM | POA: Diagnosis not present

## 2013-01-09 DIAGNOSIS — M6281 Muscle weakness (generalized): Secondary | ICD-10-CM | POA: Diagnosis not present

## 2013-01-09 DIAGNOSIS — R269 Unspecified abnormalities of gait and mobility: Secondary | ICD-10-CM | POA: Diagnosis not present

## 2013-01-09 DIAGNOSIS — R279 Unspecified lack of coordination: Secondary | ICD-10-CM | POA: Diagnosis not present

## 2013-01-09 DIAGNOSIS — R262 Difficulty in walking, not elsewhere classified: Secondary | ICD-10-CM | POA: Diagnosis not present

## 2013-01-16 DIAGNOSIS — R262 Difficulty in walking, not elsewhere classified: Secondary | ICD-10-CM | POA: Diagnosis not present

## 2013-01-16 DIAGNOSIS — IMO0002 Reserved for concepts with insufficient information to code with codable children: Secondary | ICD-10-CM | POA: Diagnosis not present

## 2013-01-16 DIAGNOSIS — M6281 Muscle weakness (generalized): Secondary | ICD-10-CM | POA: Diagnosis not present

## 2013-01-16 DIAGNOSIS — R279 Unspecified lack of coordination: Secondary | ICD-10-CM | POA: Diagnosis not present

## 2013-01-16 DIAGNOSIS — R269 Unspecified abnormalities of gait and mobility: Secondary | ICD-10-CM | POA: Diagnosis not present

## 2013-01-18 ENCOUNTER — Other Ambulatory Visit: Payer: Self-pay | Admitting: *Deleted

## 2013-01-18 MED ORDER — HYDROCODONE-ACETAMINOPHEN 5-325 MG PO TABS: 1.0000 | ORAL_TABLET | ORAL | Status: DC | PRN

## 2013-02-13 ENCOUNTER — Encounter: Payer: Self-pay | Admitting: Internal Medicine

## 2013-02-13 ENCOUNTER — Non-Acute Institutional Stay: Payer: Medicare Other | Admitting: Internal Medicine

## 2013-02-13 VITALS — BP 140/60 | HR 80 | Ht 62.0 in | Wt 130.0 lb

## 2013-02-13 DIAGNOSIS — M48062 Spinal stenosis, lumbar region with neurogenic claudication: Secondary | ICD-10-CM

## 2013-02-13 DIAGNOSIS — G4700 Insomnia, unspecified: Secondary | ICD-10-CM | POA: Diagnosis not present

## 2013-02-13 DIAGNOSIS — L600 Ingrowing nail: Secondary | ICD-10-CM | POA: Insufficient documentation

## 2013-02-13 DIAGNOSIS — M6281 Muscle weakness (generalized): Secondary | ICD-10-CM | POA: Diagnosis not present

## 2013-02-13 DIAGNOSIS — R262 Difficulty in walking, not elsewhere classified: Secondary | ICD-10-CM | POA: Diagnosis not present

## 2013-02-13 DIAGNOSIS — R269 Unspecified abnormalities of gait and mobility: Secondary | ICD-10-CM | POA: Diagnosis not present

## 2013-02-13 DIAGNOSIS — R279 Unspecified lack of coordination: Secondary | ICD-10-CM | POA: Diagnosis not present

## 2013-02-13 DIAGNOSIS — IMO0002 Reserved for concepts with insufficient information to code with codable children: Secondary | ICD-10-CM | POA: Diagnosis not present

## 2013-02-13 MED ORDER — LEVOFLOXACIN 500 MG PO TABS: ORAL_TABLET | ORAL | Status: DC

## 2013-02-13 NOTE — Progress Notes (Signed)
Patient ID: Cassandra Glenn, female   DOB: May 16, 1920, 80 y.o.   MRN: [id number]    Location:  [facility]   Place of Service: Clinic (12)    Allergies  Allergen Reactions  . Macrodantin [Nitrofurantoin Macrocrystal]     unknown  . Septra [Sulfamethoxazole-Trimethoprim]     unknown    Chief Complaint  Patient presents with  . Medical Managment of Chronic Issues    chronic back pain, insomnia, with daughter Corinna Capra  . Dizziness    was in skill unit in Dec for dizziess, UTI    HPI:  Recently was admitted to the skilled nursing unit of [facility] for dizziness. Was found to have urinary tract infection.  Main complaint today is that she has an infection of the right foot since December 2014. She has been soaking it for the last 2 weeks and using Polysporin on the great toe of the right foot. She thinks she has an ingrown nail.  Additional problems are of a "pulled muscle" of the left back following a fall. Hydrocodone/acetaminophen helps. There is also pain along the left iliac crest.  She complains of some pain in the left rib anteriorly to the sternum.  There is a sharp pain in her buttocks when sitting on both the left and right sides. The pains occur suddenly and last only a few seconds.  She feels bloated frequently. Chronic constipation continues to be present.  Patient has chronic insomnia. She has been using zolpidem.  Medications: Patient's Medications  New Prescriptions   No medications on file  Previous Medications   ACETAMINOPHEN (TYLENOL) 500 MG TABLET    Take 500 mg by mouth every 6 (six) hours as needed for pain.   ASPIRIN 81 MG TABLET    Take 81 mg by mouth daily.   BILBERRY 1000 MG CAPS    Take 1 capsule by mouth daily.   CALCIUM CARBONATE-VITAMIN D (CALCIUM 600/VITAMIN D PO)    Take by mouth 2 (two) times daily.   CHOLECALCIFEROL (VITAMIN D) 400 UNITS TABS    Take 400 Units by mouth daily. Take 1 tablet daily for vitamin supplement.   ESTRADIOL (VAGIFEM) 10 MCG TABS VAGINAL TABLET    Place vaginally. Insert one vaginally at bedtime 2-3 times a week   FISH OIL-OMEGA-3 FATTY ACIDS 1000 MG CAPSULE    Take 2 g by mouth daily. Take 1 capsule daily for vitamin supplement.   GLUCOSAMINE-VITAMIN D (GLUCOSAMINE PLUS VITAMIN D) 1500-200 MG-UNIT TABS    Take 1 tablet by mouth 2 (two) times daily. Take 1 tablet twice daily for joint health   HYDROCODONE-ACETAMINOPHEN (NORCO/VICODIN) 5-325 MG PER TABLET    Take 1-2 tablets by mouth every 4 (four) hours as needed for moderate pain.   MULTIPLE VITAMINS-MINERALS (PRESERVISION AREDS 2) CAPS    Take 2 capsules by mouth daily.   MULTIPLE VITAMINS-MINERALS (STRESS FORMULA 500/ZINC) TABS    Take 1 tablet by mouth daily.   POLYCARBOPHIL (FIBERCON) 625 MG TABLET    Take 625 mg by mouth daily.   PROBIOTIC PRODUCT (RESTORA) CAPS    Take 1 capsule by mouth daily.   SENNA (SENOKOT) 8.6 MG TABLET    Take 1 tablet by mouth 2 (two) times daily.   ZOLPIDEM (AMBIEN) 5 MG TABLET    Take 5 mg by mouth at bedtime as needed. sleep  Modified Medications   No medications on file  Discontinued Medications   CIPROFLOXACIN (CIPRO) 250 MG TABLET  Take 1 tablet (250 mg total) by mouth 2 (two) times daily. Start 11/29/12   ESTRACE VAGINAL 0.1 MG/GM VAGINAL CREAM    Insert 2 gr vaginally 2-3 times a week at bedtime   ZOLPIDEM (AMBIEN) 5 MG TABLET    Take 1 tablet (5 mg total) by mouth at bedtime as needed for sleep.     Review of Systems  Constitutional: Negative for fever, activity change, fatigue and unexpected weight change.  HENT: Positive for hearing loss. Negative for ear pain.   Eyes:       History of loss of visual acuity and macular degeneration.  Respiratory:       History of breast discomfort. History of removal of a lump from the right breast.  Cardiovascular: Negative for chest pain, palpitations and leg swelling.  Gastrointestinal:       Chronic sluggish stools and straining. She frequently  complains that her intestines are not working right. There is a previous history of diverticulosis. Her last colonoscopy was in 2008. She has been seen by Dr. Paulita Fujita in the past. Complains of bloated feeling.  Genitourinary:       Complains of a slow, weak urinary stream. Denies dysuria, frequency, or incontinence. Vaginal discomfort. Managed by Dr. Claudean Kinds.  Musculoskeletal:       Chronic back pain. Pain down the right leg. Some restriction of joint motion of the right hip.. She feels that there is something loose in the right hip area. There is generalized stiffness in the joints. There is a history of spinal stenosis. Left third finger catches on extension sometimes. There is a trigger finger. Pain at the left iliac crest and left anterior ribs near the sternum.  Skin:       History of skin discoloration and changes of nails. Previous history of folliculitis of the left lower leg. Folliculitis has cleared. Denies itching or rash.  Allergic/Immunologic: Negative.   Neurological: Negative for dizziness, tremors, seizures and numbness.       History of spinal stenosis with neurogenic claudication affecting the right leg. Complains of pain in the buttocks that is suggestive of neuralgia from her spinal stenosis.  Hematological: Bruises/bleeds easily.  Psychiatric/Behavioral: Negative.     Filed Vitals:   02/13/13 0958  BP: 140/60  Pulse: 80  Height: 5\' 2"  (1.575 m)  Weight: 130 lb (58.968 kg)   Physical Exam  Constitutional: She is oriented to person, place, and time. She appears well-developed and well-nourished. No distress.  HENT:  Head: Normocephalic and atraumatic.  Right Ear: External ear normal.  Left Ear: External ear normal.  Nose: Nose normal.  Mouth/Throat: Oropharynx is clear and moist. No oropharyngeal exudate.  Eyes: Conjunctivae and EOM are normal. Pupils are equal, round, and reactive to light. Right eye exhibits no discharge. Left eye exhibits no discharge. No  scleral icterus.  Neck: Normal range of motion. Neck supple. No JVD present. No tracheal deviation present. No thyromegaly present.  Cardiovascular: Normal rate, regular rhythm, normal heart sounds and intact distal pulses.   No murmur heard. Pulmonary/Chest: Effort normal and breath sounds normal. No stridor. No respiratory distress. She has no wheezes. She has no rales. She exhibits no tenderness.  Abdominal: Soft. Bowel sounds are normal. She exhibits no distension. There is no tenderness. There is no rebound and no guarding.  Musculoskeletal: Normal range of motion. She exhibits tenderness. She exhibits no edema.  Anterior left lower rib cage pain near the sternum. Tender in the left SI joint area and along  the left iliac crest.  Lymphadenopathy:    She has no cervical adenopathy.  Neurological: She is alert and oriented to person, place, and time. She has normal reflexes. No cranial nerve deficit. She exhibits normal muscle tone. Coordination normal.  Skin: Skin is warm and dry. No rash noted. She is not diaphoretic. No erythema.  Scaly area on the right shoulder blade.  Ingrown right great toenail with tenderness laterally and swelling of the paronychia.  Psychiatric: She has a normal mood and affect. Her behavior is normal. Judgment and thought content normal.     Labs reviewed: Nursing Home on 12/19/2012  Component Date Value Range Status  . Hemoglobin 12/18/2012 11.3* 12.0 - 16.0 g/dL Final  . HCT 12/18/2012 34* 36 - 46 % Final  . Platelets 12/18/2012 298  150 - 399 K/L Final  . WBC 12/18/2012 5.1   Final  . Glucose 12/18/2012 107   Final  . BUN 12/18/2012 9  4 - 21 mg/dL Final  . Creatinine 12/18/2012 0.6  0.5 - 1.1 mg/dL Final  . Potassium 12/18/2012 4.4  3.4 - 5.3 mmol/L Final  . Sodium 12/18/2012 136* 137 - 147 mmol/L Final  . Alkaline Phosphatase 12/18/2012 101  25 - 125 U/L Final  . ALT 12/18/2012 10  7 - 35 U/L Final  . AST 12/18/2012 15  13 - 35 U/L Final  .  Bilirubin, Total 12/18/2012 0.3   Final  Nursing Home on 12/01/2012  Component Date Value Range Status  . Hemoglobin 11/30/2012 10.4* 12.0 - 16.0 g/dL Final  . HCT 11/30/2012 31* 36 - 46 % Final  . Platelets 11/30/2012 321  150 - 399 K/L Final  . WBC 11/30/2012 8.2   Final  . Glucose 11/30/2012 110   Final  . BUN 11/30/2012 8  4 - 21 mg/dL Final  . Creatinine 11/30/2012 0.6  0.5 - 1.1 mg/dL Final  . Potassium 11/30/2012 4.6  3.4 - 5.3 mmol/L Final  . Sodium 11/30/2012 133* 137 - 147 mmol/L Final  Admission on 11/23/2012, Discharged on 11/28/2012  No results displayed because visit has over 200 results.        Assessment/Plan  1. Ingrown right big toenail See podiatrist Start levaquin 500 mg qd x 7 d  2. Spinal stenosis, lumbar region, with neurogenic claudication Causing back and leg pains and bursts of pain in the buttocks Discussed referral to neurosurgeon or orthopedist that is experienced in back problems.  3. Insomnia Continue Zolpidem

## 2013-02-15 ENCOUNTER — Other Ambulatory Visit: Payer: Self-pay | Admitting: *Deleted

## 2013-02-15 ENCOUNTER — Telehealth: Payer: Self-pay | Admitting: *Deleted

## 2013-02-15 DIAGNOSIS — H353290 Exudative age-related macular degeneration, unspecified eye, stage unspecified: Secondary | ICD-10-CM | POA: Diagnosis not present

## 2013-02-15 DIAGNOSIS — H43819 Vitreous degeneration, unspecified eye: Secondary | ICD-10-CM | POA: Diagnosis not present

## 2013-02-15 DIAGNOSIS — R279 Unspecified lack of coordination: Secondary | ICD-10-CM | POA: Diagnosis not present

## 2013-02-15 DIAGNOSIS — Z961 Presence of intraocular lens: Secondary | ICD-10-CM | POA: Diagnosis not present

## 2013-02-15 DIAGNOSIS — H353190 Nonexudative age-related macular degeneration, unspecified eye, stage unspecified: Secondary | ICD-10-CM | POA: Diagnosis not present

## 2013-02-15 DIAGNOSIS — R262 Difficulty in walking, not elsewhere classified: Secondary | ICD-10-CM | POA: Diagnosis not present

## 2013-02-15 DIAGNOSIS — R269 Unspecified abnormalities of gait and mobility: Secondary | ICD-10-CM | POA: Diagnosis not present

## 2013-02-15 DIAGNOSIS — H35379 Puckering of macula, unspecified eye: Secondary | ICD-10-CM | POA: Diagnosis not present

## 2013-02-15 DIAGNOSIS — M6281 Muscle weakness (generalized): Secondary | ICD-10-CM | POA: Diagnosis not present

## 2013-02-15 DIAGNOSIS — IMO0002 Reserved for concepts with insufficient information to code with codable children: Secondary | ICD-10-CM | POA: Diagnosis not present

## 2013-02-15 MED ORDER — AMOXICILLIN-POT CLAVULANATE 875-125 MG PO TABS: ORAL_TABLET | ORAL | Status: DC

## 2013-02-15 NOTE — Telephone Encounter (Signed)
Daughter,Emily, states that the Levoquin is causing muscle aches. Wants it changed to something else. Was thinking about Cipro. Please Advise  Pharmacy: [facility]

## 2013-02-15 NOTE — Telephone Encounter (Signed)
Change to Augmentin 875 (14 tabs) One twice daily to help infection.

## 2013-02-15 NOTE — Telephone Encounter (Signed)
Patient daughter, [name], Notified and faxed Rx into [facility]

## 2013-02-19 ENCOUNTER — Other Ambulatory Visit: Payer: Self-pay | Admitting: *Deleted

## 2013-02-19 MED ORDER — HYDROCODONE-ACETAMINOPHEN 5-325 MG PO TABS: 1.0000 | ORAL_TABLET | ORAL | Status: DC | PRN

## 2013-02-20 DIAGNOSIS — R269 Unspecified abnormalities of gait and mobility: Secondary | ICD-10-CM | POA: Diagnosis not present

## 2013-02-20 DIAGNOSIS — IMO0002 Reserved for concepts with insufficient information to code with codable children: Secondary | ICD-10-CM | POA: Diagnosis not present

## 2013-02-20 DIAGNOSIS — R262 Difficulty in walking, not elsewhere classified: Secondary | ICD-10-CM | POA: Diagnosis not present

## 2013-02-20 DIAGNOSIS — R279 Unspecified lack of coordination: Secondary | ICD-10-CM | POA: Diagnosis not present

## 2013-02-20 DIAGNOSIS — M6281 Muscle weakness (generalized): Secondary | ICD-10-CM | POA: Diagnosis not present

## 2013-02-22 DIAGNOSIS — R279 Unspecified lack of coordination: Secondary | ICD-10-CM | POA: Diagnosis not present

## 2013-02-22 DIAGNOSIS — R262 Difficulty in walking, not elsewhere classified: Secondary | ICD-10-CM | POA: Diagnosis not present

## 2013-02-22 DIAGNOSIS — R269 Unspecified abnormalities of gait and mobility: Secondary | ICD-10-CM | POA: Diagnosis not present

## 2013-02-22 DIAGNOSIS — IMO0002 Reserved for concepts with insufficient information to code with codable children: Secondary | ICD-10-CM | POA: Diagnosis not present

## 2013-02-22 DIAGNOSIS — M6281 Muscle weakness (generalized): Secondary | ICD-10-CM | POA: Diagnosis not present

## 2013-02-25 ENCOUNTER — Emergency Department (HOSPITAL_COMMUNITY): Payer: Medicare Other

## 2013-02-25 ENCOUNTER — Inpatient Hospital Stay (HOSPITAL_COMMUNITY)
Admission: EM | Admit: 2013-02-25 | Discharge: 2013-02-26 | DRG: 392 | Disposition: A | Discharge status: Home / Self Care | Payer: Medicare Other | Attending: Internal Medicine | Admitting: Internal Medicine

## 2013-02-25 ENCOUNTER — Encounter (HOSPITAL_COMMUNITY): Payer: Self-pay | Admitting: Emergency Medicine

## 2013-02-25 DIAGNOSIS — K5900 Constipation, unspecified: Secondary | ICD-10-CM | POA: Diagnosis not present

## 2013-02-25 DIAGNOSIS — R112 Nausea with vomiting, unspecified: Secondary | ICD-10-CM | POA: Diagnosis not present

## 2013-02-25 DIAGNOSIS — H3530 Unspecified macular degeneration: Secondary | ICD-10-CM | POA: Diagnosis present

## 2013-02-25 DIAGNOSIS — S72043A Displaced fracture of base of neck of unspecified femur, initial encounter for closed fracture: Secondary | ICD-10-CM

## 2013-02-25 DIAGNOSIS — D72829 Elevated white blood cell count, unspecified: Secondary | ICD-10-CM | POA: Diagnosis present

## 2013-02-25 DIAGNOSIS — R509 Fever, unspecified: Secondary | ICD-10-CM

## 2013-02-25 DIAGNOSIS — D638 Anemia in other chronic diseases classified elsewhere: Secondary | ICD-10-CM | POA: Diagnosis present

## 2013-02-25 DIAGNOSIS — A088 Other specified intestinal infections: Principal | ICD-10-CM | POA: Diagnosis present

## 2013-02-25 DIAGNOSIS — I2510 Atherosclerotic heart disease of native coronary artery without angina pectoris: Secondary | ICD-10-CM | POA: Diagnosis present

## 2013-02-25 DIAGNOSIS — S22000A Wedge compression fracture of unspecified thoracic vertebra, initial encounter for closed fracture: Secondary | ICD-10-CM

## 2013-02-25 DIAGNOSIS — C50919 Malignant neoplasm of unspecified site of unspecified female breast: Secondary | ICD-10-CM

## 2013-02-25 DIAGNOSIS — A084 Viral intestinal infection, unspecified: Secondary | ICD-10-CM | POA: Diagnosis present

## 2013-02-25 DIAGNOSIS — R197 Diarrhea, unspecified: Secondary | ICD-10-CM | POA: Diagnosis not present

## 2013-02-25 DIAGNOSIS — Z7982 Long term (current) use of aspirin: Secondary | ICD-10-CM

## 2013-02-25 HISTORY — DX: Unspecified osteoarthritis, unspecified site: M19.90

## 2013-02-25 HISTORY — DX: Shortness of breath: R06.02

## 2013-02-25 LAB — COMPREHENSIVE METABOLIC PANEL WITH GFR
ALT: 20 U/L (ref 0–35)
AST: 29 U/L (ref 0–37)
Albumin: 3.6 g/dL (ref 3.5–5.2)
Alkaline Phosphatase: 85 U/L (ref 39–117)
BUN: 17 mg/dL (ref 6–23)
CO2: 23 meq/L (ref 19–32)
Calcium: 8.8 mg/dL (ref 8.4–10.5)
Chloride: 98 meq/L (ref 96–112)
Creatinine, Ser: 0.6 mg/dL (ref 0.50–1.10)
GFR calc Af Amer: >90 mL/min (ref >90)
GFR calc non Af Amer: 78 mL/min — ABNORMAL LOW (ref >90)
Glucose, Bld: 161 mg/dL — ABNORMAL HIGH (ref 70–99)
Potassium: 4.1 meq/L (ref 3.7–5.3)
Sodium: 134 meq/L — ABNORMAL LOW (ref 137–147)
Total Bilirubin: 0.4 mg/dL (ref 0.3–1.2)
Total Protein: 6.7 g/dL (ref 6.0–8.3)

## 2013-02-25 LAB — TYPE AND SCREEN
ABO/RH(D): A POS
Antibody Screen: NEGATIVE

## 2013-02-25 LAB — COMPREHENSIVE METABOLIC PANEL
ALBUMIN: 3.5 g/dL (ref 3.5–5.2)
ALT: 21 U/L (ref 0–35)
AST: 28 U/L (ref 0–37)
Alkaline Phosphatase: 84 U/L (ref 39–117)
BUN: 15 mg/dL (ref 6–23)
CALCIUM: 8.7 mg/dL (ref 8.4–10.5)
CO2: 24 mEq/L (ref 19–32)
CREATININE: 0.59 mg/dL (ref 0.50–1.10)
Chloride: 97 mEq/L (ref 96–112)
GFR calc Af Amer: >90 mL/min (ref >90)
GFR calc non Af Amer: 78 mL/min — ABNORMAL LOW (ref >90)
Glucose, Bld: 128 mg/dL — ABNORMAL HIGH (ref 70–99)
Potassium: 4.3 mEq/L (ref 3.7–5.3)
Sodium: 135 mEq/L — ABNORMAL LOW (ref 137–147)
Total Bilirubin: 0.5 mg/dL (ref 0.3–1.2)
Total Protein: 6.8 g/dL (ref 6.0–8.3)

## 2013-02-25 LAB — CBC WITH DIFFERENTIAL/PLATELET
Basophils Absolute: 0 K/uL (ref 0.0–0.1)
Basophils Absolute: 0 10*3/uL (ref 0.0–0.1)
Basophils Relative: 0 % (ref 0–1)
Basophils Relative: 0 % (ref 0–1)
Eosinophils Absolute: 0.3 K/uL (ref 0.0–0.7)
Eosinophils Absolute: 0.1 10*3/uL (ref 0.0–0.7)
Eosinophils Relative: 3 % (ref 0–5)
Eosinophils Relative: 1 % (ref 0–5)
HCT: 34.2 % — ABNORMAL LOW (ref 36.0–46.0)
HEMATOCRIT: 36.3 % (ref 36.0–46.0)
HEMOGLOBIN: 12 g/dL (ref 12.0–15.0)
Hemoglobin: 11.5 g/dL — ABNORMAL LOW (ref 12.0–15.0)
LYMPHS PCT: 10 % — AB (ref 12–46)
Lymphocytes Relative: 8 % — ABNORMAL LOW (ref 12–46)
Lymphs Abs: 0.8 K/uL (ref 0.7–4.0)
Lymphs Abs: 0.8 10*3/uL (ref 0.7–4.0)
MCH: 30.1 pg (ref 26.0–34.0)
MCH: 29.6 pg (ref 26.0–34.0)
MCHC: 33.6 g/dL (ref 30.0–36.0)
MCHC: 33.1 g/dL (ref 30.0–36.0)
MCV: 89.5 fL (ref 78.0–100.0)
MCV: 89.4 fL (ref 78.0–100.0)
MONO ABS: 0.4 10*3/uL (ref 0.1–1.0)
Monocytes Absolute: 0.8 K/uL (ref 0.1–1.0)
Monocytes Relative: 8 % (ref 3–12)
Monocytes Relative: 4 % (ref 3–12)
NEUTROS PCT: 85 % — AB (ref 43–77)
Neutro Abs: 8.3 K/uL — ABNORMAL HIGH (ref 1.7–7.7)
Neutro Abs: 7.2 10*3/uL (ref 1.7–7.7)
Neutrophils Relative %: 82 % — ABNORMAL HIGH (ref 43–77)
Platelets: 213 K/uL (ref 150–400)
Platelets: 211 10*3/uL (ref 150–400)
RBC: 3.82 MIL/uL — ABNORMAL LOW (ref 3.87–5.11)
RBC: 4.06 MIL/uL (ref 3.87–5.11)
RDW: 14.4 % (ref 11.5–15.5)
RDW: 14.3 % (ref 11.5–15.5)
WBC: 10.2 K/uL (ref 4.0–10.5)
WBC: 8.4 10*3/uL (ref 4.0–10.5)

## 2013-02-25 LAB — CBC
HCT: 36.8 % (ref 36.0–46.0)
Hemoglobin: 12 g/dL (ref 12.0–15.0)
MCH: 29.4 pg (ref 26.0–34.0)
MCHC: 32.6 g/dL (ref 30.0–36.0)
MCV: 90.2 fL (ref 78.0–100.0)
PLATELETS: 232 10*3/uL (ref 150–400)
RBC: 4.08 MIL/uL (ref 3.87–5.11)
RDW: 14.4 % (ref 11.5–15.5)
WBC: 12.1 10*3/uL — ABNORMAL HIGH (ref 4.0–10.5)

## 2013-02-25 LAB — MAGNESIUM: MAGNESIUM: 2.1 mg/dL (ref 1.5–2.5)

## 2013-02-25 LAB — URINE MICROSCOPIC-ADD ON

## 2013-02-25 LAB — PHOSPHORUS: PHOSPHORUS: 2.6 mg/dL (ref 2.3–4.6)

## 2013-02-25 LAB — PROTIME-INR
INR: 0.95 (ref 0.00–1.49)
INR: 0.99 (ref 0.00–1.49)
PROTHROMBIN TIME: 12.9 s (ref 11.6–15.2)
Prothrombin Time: 12.5 s (ref 11.6–15.2)

## 2013-02-25 LAB — URINALYSIS, ROUTINE W REFLEX MICROSCOPIC
Bilirubin Urine: NEGATIVE
Bilirubin Urine: NEGATIVE
GLUCOSE, UA: NEGATIVE mg/dL
Glucose, UA: NEGATIVE mg/dL
Hgb urine dipstick: NEGATIVE
Ketones, ur: NEGATIVE mg/dL
Ketones, ur: 15 mg/dL — AB
LEUKOCYTES UA: NEGATIVE
Nitrite: NEGATIVE
Nitrite: NEGATIVE
PROTEIN: NEGATIVE mg/dL
Protein, ur: NEGATIVE mg/dL
SPECIFIC GRAVITY, URINE: 1.019 (ref 1.005–1.030)
Specific Gravity, Urine: 1.016 (ref 1.005–1.030)
Urobilinogen, UA: 0.2 mg/dL (ref 0.0–1.0)
Urobilinogen, UA: 0.2 mg/dL (ref 0.0–1.0)
pH: 7 (ref 5.0–8.0)
pH: 6.5 (ref 5.0–8.0)

## 2013-02-25 LAB — ABO/RH: ABO/RH(D): A POS

## 2013-02-25 LAB — APTT
aPTT: 27 s (ref 24–37)
aPTT: 28 seconds (ref 24–37)

## 2013-02-25 LAB — CG4 I-STAT (LACTIC ACID): LACTIC ACID, VENOUS: 0.91 mmol/L (ref 0.5–2.2)

## 2013-02-25 LAB — LIPASE, BLOOD: Lipase: 22 U/L (ref 11–59)

## 2013-02-25 LAB — OCCULT BLOOD, POC DEVICE: Fecal Occult Bld: NEGATIVE

## 2013-02-25 MED ORDER — HYDROCODONE-ACETAMINOPHEN 5-325 MG PO TABS
1.0000 | ORAL_TABLET | ORAL | Status: DC | PRN
  Administered 2013-02-25 – 2013-02-26 (×3): 1 via ORAL
  Filled 2013-02-25 (×3): qty 1

## 2013-02-25 MED ORDER — SODIUM CHLORIDE 0.9 % IV SOLN
Freq: Once | INTRAVENOUS | Status: AC
  Administered 2013-02-25: 04:00:00 via INTRAVENOUS

## 2013-02-25 MED ORDER — SODIUM CHLORIDE 0.9 % IV SOLN
INTRAVENOUS | Status: AC
  Administered 2013-02-25: 13:00:00 via INTRAVENOUS

## 2013-02-25 MED ORDER — ACETAMINOPHEN 325 MG PO TABS: 650.0000 mg | ORAL_TABLET | Freq: Four times a day (QID) | ORAL | Status: DC | PRN

## 2013-02-25 MED ORDER — GLUCOSAMINE PLUS VITAMIN D 1500-200 MG-UNIT PO TABS: 1.0000 | ORAL_TABLET | Freq: Two times a day (BID) | ORAL | Status: DC

## 2013-02-25 MED ORDER — STRESS FORMULA 500/ZINC PO TABS: 1.0000 | ORAL_TABLET | Freq: Every day | ORAL | Status: DC

## 2013-02-25 MED ORDER — OCUVITE-LUTEIN PO CAPS
1.0000 | ORAL_CAPSULE | Freq: Every day | ORAL | Status: DC
  Administered 2013-02-26: 1 via ORAL
  Filled 2013-02-25 (×2): qty 1

## 2013-02-25 MED ORDER — PANTOPRAZOLE SODIUM 40 MG IV SOLR
40.0000 mg | Freq: Once | INTRAVENOUS | Status: AC
  Administered 2013-02-25: 40 mg via INTRAVENOUS
  Filled 2013-02-25: qty 40

## 2013-02-25 MED ORDER — CHOLECALCIFEROL 10 MCG (400 UNIT) PO TABS
400.0000 [IU] | ORAL_TABLET | Freq: Every day | ORAL | Status: DC
  Administered 2013-02-26: 400 [IU] via ORAL
  Filled 2013-02-25 (×2): qty 1

## 2013-02-25 MED ORDER — ASPIRIN 81 MG PO CHEW
81.0000 mg | CHEWABLE_TABLET | Freq: Every day | ORAL | Status: DC
  Administered 2013-02-26: 81 mg via ORAL
  Filled 2013-02-25: qty 1

## 2013-02-25 MED ORDER — PRESERVISION AREDS 2 PO CAPS: 2.0000 | ORAL_CAPSULE | Freq: Every day | ORAL | Status: DC

## 2013-02-25 MED ORDER — OMEGA-3 FATTY ACIDS 1000 MG PO CAPS: 2.0000 g | ORAL_CAPSULE | Freq: Every day | ORAL | Status: DC

## 2013-02-25 MED ORDER — ONDANSETRON HCL 4 MG/2ML IJ SOLN: 4.0000 mg | Freq: Three times a day (TID) | INTRAMUSCULAR | Status: AC | PRN

## 2013-02-25 MED ORDER — ACETAMINOPHEN 650 MG RE SUPP: 650.0000 mg | Freq: Four times a day (QID) | RECTAL | Status: DC | PRN

## 2013-02-25 MED ORDER — BACID PO TABS
1.0000 | ORAL_TABLET | Freq: Every day | ORAL | Status: DC
  Administered 2013-02-26: 1 via ORAL
  Filled 2013-02-25 (×2): qty 1

## 2013-02-25 MED ORDER — OMEGA-3-ACID ETHYL ESTERS 1 G PO CAPS
2.0000 g | ORAL_CAPSULE | Freq: Every day | ORAL | Status: DC
  Administered 2013-02-26: 2 g via ORAL
  Filled 2013-02-25 (×2): qty 2

## 2013-02-25 MED ORDER — RESTORA PO CAPS: 1.0000 | ORAL_CAPSULE | Freq: Every day | ORAL | Status: DC

## 2013-02-25 MED ORDER — ONDANSETRON HCL 4 MG/2ML IJ SOLN
4.0000 mg | Freq: Four times a day (QID) | INTRAMUSCULAR | Status: DC | PRN
  Administered 2013-02-25: 4 mg via INTRAVENOUS
  Filled 2013-02-25: qty 2

## 2013-02-25 MED ORDER — ZOLPIDEM TARTRATE 5 MG PO TABS
2.5000 mg | ORAL_TABLET | Freq: Every evening | ORAL | Status: DC | PRN
  Administered 2013-02-25: 2.5 mg via ORAL
  Filled 2013-02-25: qty 1

## 2013-02-25 NOTE — ED Provider Notes (Signed)
CSN: [id number]     Arrival date & time 02/25/13  0345 History   First MD Initiated Contact with Patient 02/25/13 0345     Chief Complaint  Patient presents with  . Emesis     (Consider location/radiation/quality/duration/timing/severity/associated sxs/prior Treatment) HPI Comments: 80 year old female with a history of breast cancer, spinal stenosis, hyponatremia (130-135) presents with cc of nausea and emesis. Pt woke up in the middle of the night with emesis. Pt has had emesis x 5 times, she thinks the emesis was bloody/coffee ground. Pt did have chocolate cake for dinner. Pt has no abd pain associated with this and no diarrhea. No hx of abd surgeries. Pt denies chest pain, dib, dizziness.  Patient is a 80 y.o. female presenting with vomiting. The history is provided by the patient.  Emesis Associated symptoms: no abdominal pain and no headaches     Past Medical History  Diagnosis Date  . Cancer   . Macular degeneration   . HOH (hard of hearing)   . Vaginitis and vulvovaginitis 02/29/2012  . Abnormalities of the hair 02/29/2012  . Spinal stenosis, lumbar region, with neurogenic claudication 11/02/2011  . Trigger finger (acquired) 11/02/2011  . Right bundle branch block 08/30/2011  . Edema 05/07/2011  . Contact dermatitis and other eczema, due to unspecified cause 05/07/2011  . Hypopotassemia 03/26/2011  . Candidiasis of skin and nails 03/19/2011  . Hyposmolality and/or hyponatremia 03/19/2011  . Acute posthemorrhagic anemia 03/19/2011  . Diverticulosis of colon (without mention of hemorrhage) 03/19/2011  . Unspecified constipation 03/19/2011  . Pain in joint, pelvic region and thigh 03/19/2011  . Lumbago 03/19/2011  . Spasm of muscle 03/19/2011  . Osteoporosis, unspecified 03/19/2011  . Insomnia, unspecified 03/19/2011  . Other malaise and fatigue 03/19/2011  . Impaired fasting glucose 03/19/2011  . Coronary atherosclerosis of native coronary artery 03/18/2011  .  Hypotension, unspecified 03/18/2011  . Closed fracture of base of neck of femur 03/18/2011  . Malignant neoplasm of breast (female), unspecified site 10/27/2004   Past Surgical History  Procedure Laterality Date  . Cataract extraction  2010    bialteral  . Hip pinning,cannulated  03/08/2011    Procedure: CANNULATED HIP PINNING;  Surgeon: Johnn Hai, MD;  Location: [facility];  Service: Orthopedics;  Laterality: Right;  . Cholecystectomy    . Eye surgery      cataract  . Abdominal hysterectomy      abd  . Fracture surgery Left 09/1980    ankle  . Breast surgery Bilateral 2005-10/27/2004    lumpectomy- Streck,MD  . Squamous cell carcinoma excision Right 02/04/2011    forearm- Taffeen, MD  . Orif hip fracture Right 03/08/2011    Bean,MD  . Skin lesion excision Right 02/04/2011    Abdomen lesion spongiotic dermatitis-Taffeen, MD  . Skin cancer excision Left 10/12/2012    lower leg Dr. Syble Creek   Family History  Problem Relation Age of Onset  . Heart disease Mother   . Cancer Father   . Emphysema Brother   . Alzheimer's disease Brother    History  Substance Use Topics  . Smoking status: Never Smoker   . Smokeless tobacco: Never Used  . Alcohol Use: 0.0 oz/week    0 Glasses of wine per week     Comment: occasional glass   OB History   Grav Para Term Preterm Abortions TAB SAB Ect Mult Living                 Review of Systems  Constitutional: Negative for activity change.  Respiratory: Negative for shortness of breath.   Cardiovascular: Negative for chest pain.  Gastrointestinal: Positive for nausea and vomiting. Negative for abdominal pain.  Genitourinary: Negative for dysuria.  Musculoskeletal: Negative for neck pain.  Neurological: Negative for headaches.  All other systems reviewed and are negative.      Allergies  Macrodantin and Septra  Home Medications   Current Outpatient Rx  Name  Route  Sig  Dispense  Refill  . aspirin 81 MG tablet   Oral   Take  81 mg by mouth daily.         . Bilberry 1000 MG CAPS   Oral   Take 1 capsule by mouth daily.         . Calcium Carbonate-Vitamin D (CALCIUM 600/VITAMIN D PO)   Oral   Take by mouth 2 (two) times daily.         . Calcium Polycarbophil (FIBERCON PO)   Oral   Take 2 tablets by mouth daily.         . cholecalciferol (VITAMIN D) 400 UNITS TABS   Oral   Take 400 Units by mouth daily. Take 1 tablet daily for vitamin supplement.         . fish oil-omega-3 fatty acids 1000 MG capsule   Oral   Take 2 g by mouth daily. Take 1 capsule daily for vitamin supplement.         . Glucosamine-Vitamin D (GLUCOSAMINE PLUS VITAMIN D) 1500-200 MG-UNIT TABS   Oral   Take 1 tablet by mouth 2 (two) times daily. Take 1 tablet twice daily for joint health         . Lutein 6 MG TABS   Oral   Take 1 tablet by mouth daily.         . Multiple Vitamins-Minerals (PRESERVISION AREDS 2) CAPS   Oral   Take 2 capsules by mouth daily.         . Multiple Vitamins-Minerals (STRESS FORMULA 500/ZINC) TABS   Oral   Take 1 tablet by mouth daily.         . Probiotic Product (RESTORA) CAPS   Oral   Take 1 capsule by mouth daily.         Marland Kitchen zolpidem (AMBIEN) 5 MG tablet   Oral   Take 2.5 mg by mouth at bedtime as needed for sleep.         Marland Kitchen acetaminophen (TYLENOL) 500 MG tablet   Oral   Take 500 mg by mouth every 6 (six) hours as needed for pain.         Marland Kitchen HYDROcodone-acetaminophen (NORCO/VICODIN) 5-325 MG per tablet   Oral   Take 1-2 tablets by mouth every 4 (four) hours as needed for moderate pain.   120 tablet   0    BP 149/63  Pulse 100  Temp(Src) 100.6 F (38.1 C) (Rectal)  Resp 16  SpO2 96% Physical Exam  Nursing note and vitals reviewed. Constitutional: She is oriented to person, place, and time. She appears well-developed and well-nourished.  HENT:  Head: Normocephalic and atraumatic.  Eyes: EOM are normal. Pupils are equal, round, and reactive to light.  Neck:  Neck supple.  Cardiovascular: Normal rate, regular rhythm and normal heart sounds.   No murmur heard. Pulmonary/Chest: Effort normal. No respiratory distress.  Abdominal: Soft. She exhibits no distension. There is tenderness. There is no rebound and no guarding.  Mild periumbilical tenderness  Neurological: She is  alert and oriented to person, place, and time.  Skin: Skin is warm and dry.    ED Course  Procedures (including critical care time) Labs Review Labs Reviewed  CBC WITH DIFFERENTIAL - Abnormal; Notable for the following:    RBC 3.82 (*)    Hemoglobin 11.5 (*)    HCT 34.2 (*)    Neutrophils Relative % 82 (*)    Neutro Abs 8.3 (*)    Lymphocytes Relative 8 (*)    All other components within normal limits  COMPREHENSIVE METABOLIC PANEL - Abnormal; Notable for the following:    Sodium 134 (*)    Glucose, Bld 161 (*)    GFR calc non Af Amer 78 (*)    All other components within normal limits  URINALYSIS, ROUTINE W REFLEX MICROSCOPIC - Abnormal; Notable for the following:    Leukocytes, UA TRACE (*)    All other components within normal limits  CBC - Abnormal; Notable for the following:    WBC 12.1 (*)    All other components within normal limits  LIPASE, BLOOD  APTT  PROTIME-INR  URINE MICROSCOPIC-ADD ON  CG4 I-STAT (LACTIC ACID)  OCCULT BLOOD, POC DEVICE  TYPE AND SCREEN  ABO/RH   Imaging Review No results found.  EKG Interpretation   None       MDM   Final diagnoses:  None    Pt comes in with cc of nausea and emesis. Pt had questionable bloody emesis.  She has no hx of heavy nsaid use, alcohol abuse, liver dz, or GERD. She has normal Hb, and guaic negative stools. Repeat CBC is unchanged. Suspect that the coffee ground emesis, was likely from the food.  AAS ordered - to r/o obstruction. PO challenge initiated.  If patient passed challenge, we will d/c her. Signing her out to incoming staff.  Varney Biles, MD 02/25/13 [phone]

## 2013-02-25 NOTE — ED Provider Notes (Signed)
Pt failed fluid challenge, does not feel well.  Abd is slightly distended, but no abd pain.  Pt and family agreeable for admission and further evaluation and treatment.  I spoke to Dr. Charlies Silvers who agrees on admission to medical bed.    Cassandra Glenn. Cassandra Hair, MD 02/25/13 1026

## 2013-02-25 NOTE — ED Notes (Signed)
Pt arrived from [place] via [place] c/o emesis x 5.

## 2013-02-25 NOTE — Progress Notes (Signed)
PHARMACIST - PHYSICIAN ORDER COMMUNICATION  CONCERNING: P&T Medication Policy on Herbal Medications  DESCRIPTION:  This patient's order for:  Glucosamine OTC   has been noted.  This product(s) is classified as an "herbal" or natural product. Due to a lack of definitive safety studies or FDA approval, nonstandard manufacturing practices, plus the potential risk of unknown drug-drug interactions while on inpatient medications, the Pharmacy and Therapeutics Committee does not permit the use of "herbal" or natural products of this type within [facility].   ACTION TAKEN: The pharmacy department is unable to verify this order at this time and your patient has been informed of this safety policy. Please reevaluate patient's clinical condition at discharge and address if the herbal or natural product(s) should be resumed at that time.  Renelda Mom Jacqlyn Larsen, PharmD Clinical Pharmacist - Resident Pager: [phone] Pharmacy: [phone] 02/25/2013 11:40 AM

## 2013-02-25 NOTE — Progress Notes (Signed)
02/25/13 1100 nsg To unit 6E accompanied by RN per stretcher; daughter in room alert and oriented patient; no skin issues noted; oriented to unit set-up; fall precaution bed alarm call light discussed with patient and daughter. Continued to monitor.

## 2013-02-25 NOTE — H&P (Addendum)
[facility] History and Physical  Cassandra Glenn [id number] DOB: May 30, 1920 DOA: 02/25/2013  Referring physician: ER physician PCP: Estill Dooms, MD   Chief Complaint: Nausea and vomiting  HPI:  80 year old female who presented to [facility] ED 02/25/2013, brought to ED by her daughter with nausea and vomiting started at 3 am this morning prior to this admission. Pt had reports of soft stools but no blood in stool. She was not sure if there was blood in emesis but she thought it looked like blood. Per patient's daughter, pt was on amoxicillin for toe infection and she has just completed 10 day course of antibiotic treatment. No reports of abdominal discomfort. Pt reported she had subjective fever at home. No chest pain, no lightheadedness or loss of consciousness. In ED, vitals were stable. Blood work was essentially unremarkable, slight leukocytosis which then resolved on subsequent blood work up. Abd x ray showed some stool burden but no obstruction. Pt was admitted for further evaluation.   Assessment and Plan:  Principal Problem:   Nausea and vomiting - likely viral gastroenteritis - obtain C.diff by PCR to rule out C. Diff colitis; obtain blood culture, UA, urine culture Active Problems:   Leukocytosis - likely due to viral gastroenteritis - management as aabove   Radiological Exams on Admission: Dg Abd Acute W/chest 02/25/2013    IMPRESSION: Air-filled prominent bowel loop in the central abdomen likely minimally distended sigmoid colon. Mild fecal retention throughout the colon. Possible minimally dilated small bowel loop versus normal colonic loop also over the central abdomen. Recommend followup abdominal films as clinically indicated.  No acute cardiopulmonary disease.  Stable T11 compression fracture.  Chronic focal opacification right apex.    Code Status: Full Family Communication: Pt at bedside Disposition Plan: Admit for further evaluation  Leisa Lenz, MD  [facility] Pager [phone]  Review of Systems:  Constitutional: Negative for fever, chills and malaise/fatigue. Negative for diaphoresis.  HENT: Negative for hearing loss, ear pain, nosebleeds, congestion, sore throat, neck pain, tinnitus and ear discharge.   Eyes: Negative for blurred vision, double vision, photophobia, pain, discharge and redness.  Respiratory: Negative for cough, hemoptysis, sputum production, shortness of breath, wheezing and stridor.   Cardiovascular: Negative for chest pain, palpitations, orthopnea, claudication and leg swelling.  Gastrointestinal: per HPI.  Genitourinary: Negative for dysuria, urgency, frequency, hematuria and flank pain.  Musculoskeletal: Negative for myalgias, back pain, joint pain and falls.  Skin: Negative for itching and rash.  Neurological: Negative for dizziness and weakness. Negative for tingling, tremors, sensory change, speech change, focal weakness, loss of consciousness and headaches.  Endo/Heme/Allergies: Negative for environmental allergies and polydipsia. Does not bruise/bleed easily.  Psychiatric/Behavioral: Negative for suicidal ideas. The patient is not nervous/anxious.      Past Medical History  Diagnosis Date  . Cancer   . Macular degeneration   . HOH (hard of hearing)   . Vaginitis and vulvovaginitis 02/29/2012  . Abnormalities of the hair 02/29/2012  . Spinal stenosis, lumbar region, with neurogenic claudication 11/02/2011  . Trigger finger (acquired) 11/02/2011  . Right bundle branch block 08/30/2011  . Edema 05/07/2011  . Contact dermatitis and other eczema, due to unspecified cause 05/07/2011  . Hypopotassemia 03/26/2011  . Candidiasis of skin and nails 03/19/2011  . Hyposmolality and/or hyponatremia 03/19/2011  . Acute posthemorrhagic anemia 03/19/2011  . Diverticulosis of colon (without mention of hemorrhage) 03/19/2011  . Unspecified constipation 03/19/2011  . Pain in joint, pelvic region and thigh 03/19/2011  .  Lumbago 03/19/2011  . Spasm of muscle 03/19/2011  . Osteoporosis, unspecified 03/19/2011  . Insomnia, unspecified 03/19/2011  . Other malaise and fatigue 03/19/2011  . Impaired fasting glucose 03/19/2011  . Coronary atherosclerosis of native coronary artery 03/18/2011  . Hypotension, unspecified 03/18/2011  . Closed fracture of base of neck of femur 03/18/2011  . Malignant neoplasm of breast (female), unspecified site 10/27/2004   Past Surgical History  Procedure Laterality Date  . Cataract extraction  2010    bialteral  . Hip pinning,cannulated  03/08/2011    Procedure: CANNULATED HIP PINNING;  Surgeon: Johnn Hai, MD;  Location: [facility];  Service: Orthopedics;  Laterality: Right;  . Cholecystectomy    . Eye surgery      cataract  . Abdominal hysterectomy      abd  . Fracture surgery Left 09/1980    ankle  . Breast surgery Bilateral 2005-10/27/2004    lumpectomy- Streck,MD  . Squamous cell carcinoma excision Right 02/04/2011    forearm- Taffeen, MD  . Orif hip fracture Right 03/08/2011    Bean,MD  . Skin lesion excision Right 02/04/2011    Abdomen lesion spongiotic dermatitis-Taffeen, MD  . Skin cancer excision Left 10/12/2012    lower leg Dr. Syble Creek   Social History:  reports that she has never smoked. She has never used smokeless tobacco. She reports that she drinks alcohol. She reports that she does not use illicit drugs.  Allergies  Allergen Reactions  . Macrodantin [Nitrofurantoin Macrocrystal]     unknown  . Septra [Sulfamethoxazole-Trimethoprim]     unknown    Family History:  Family History  Problem Relation Age of Onset  . Heart disease Mother   . Cancer Father   . Emphysema Brother   . Alzheimer's disease Brother      Prior to Admission medications   Medication Sig Start Date End Date Taking? Authorizing Provider  aspirin 81 MG tablet Take 81 mg by mouth daily.   Yes Historical Provider, MD  Bilberry 1000 MG CAPS Take 1 capsule by mouth daily.    Yes Historical Provider, MD  Calcium Carbonate-Vitamin D (CALCIUM 600/VITAMIN D PO) Take by mouth 2 (two) times daily.   Yes Historical Provider, MD  Calcium Polycarbophil (FIBERCON PO) Take 2 tablets by mouth daily.   Yes Historical Provider, MD  cholecalciferol (VITAMIN D) 400 UNITS TABS Take 400 Units by mouth daily. Take 1 tablet daily for vitamin supplement.   Yes Historical Provider, MD  fish oil-omega-3 fatty acids 1000 MG capsule Take 2 g by mouth daily. Take 1 capsule daily for vitamin supplement.   Yes Historical Provider, MD  Glucosamine-Vitamin D (GLUCOSAMINE PLUS VITAMIN D) 1500-200 MG-UNIT TABS Take 1 tablet by mouth 2 (two) times daily. Take 1 tablet twice daily for joint health   Yes Historical Provider, MD  Lutein 6 MG TABS Take 1 tablet by mouth daily.   Yes Historical Provider, MD  Multiple Vitamins-Minerals (PRESERVISION AREDS 2) CAPS Take 2 capsules by mouth daily.   Yes Historical Provider, MD  Multiple Vitamins-Minerals (STRESS FORMULA 500/ZINC) TABS Take 1 tablet by mouth daily.   Yes Historical Provider, MD  Probiotic Product (RESTORA) CAPS Take 1 capsule by mouth daily.   Yes Historical Provider, MD  zolpidem (AMBIEN) 5 MG tablet Take 2.5 mg by mouth at bedtime as needed for sleep.   Yes Historical Provider, MD  acetaminophen (TYLENOL) 500 MG tablet Take 500 mg by mouth every 6 (six) hours as needed for pain.  Historical Provider, MD  HYDROcodone-acetaminophen (NORCO/VICODIN) 5-325 MG per tablet Take 1-2 tablets by mouth every 4 (four) hours as needed for moderate pain. 02/19/13   Gayland Curry, DO   Physical Exam: Filed Vitals:   02/25/13 0815 02/25/13 0820 02/25/13 0830 02/25/13 1000  BP: 149/63  141/60 140/74  Pulse: 100  98 96  Temp:  100.6 F (38.1 C)    TempSrc:  Rectal    Resp:      SpO2: 96%  94% 97%    Physical Exam  Constitutional: Appears well-developed and well-nourished. No distress.  HENT: Normocephalic. External right and left ear normal.  Eyes:  Conjunctivae and EOM are normal. PERRLA, no scleral icterus.  Neck: Normal ROM. Neck supple. No JVD. No tracheal deviation. No thyromegaly.  CVS: RRR, S1/S2 appreciated Pulmonary: Effort and breath sounds normal, no stridor, rhonchi, wheezes, rales.  Abdominal: Soft. BS +,  ? Chronic distension, no tenderness, rebound or guarding.  Musculoskeletal: Normal range of motion. No edema and no tenderness.  Lymphadenopathy: No lymphadenopathy noted, cervical, inguinal. Neuro: Alert. Normal reflexes, muscle tone coordination. No cranial nerve deficit. Skin: Skin is warm and dry. No rash noted.  Psychiatric: Normal mood and affect. Behavior, judgment, thought content normal.   Labs on Admission:  Basic Metabolic Panel:  Recent Labs Lab 02/25/13 0350  NA 134*  K 4.1  CL 98  CO2 23  GLUCOSE 161*  BUN 17  CREATININE 0.60  CALCIUM 8.8   Liver Function Tests:  Recent Labs Lab 02/25/13 0350  AST 29  ALT 20  ALKPHOS 85  BILITOT 0.4  PROT 6.7  ALBUMIN 3.6    Recent Labs Lab 02/25/13 0350  LIPASE 22   No results found for this basename: AMMONIA,  in the last 168 hours CBC:  Recent Labs Lab 02/25/13 0350 02/25/13 0609  WBC 10.2 12.1*  NEUTROABS 8.3*  --   HGB 11.5* 12.0  HCT 34.2* 36.8  MCV 89.5 90.2  PLT 213 232   Cardiac Enzymes: No results found for this basename: CKTOTAL, CKMB, CKMBINDEX, TROPONINI,  in the last 168 hours BNP: No components found with this basename: POCBNP,  CBG: No results found for this basename: GLUCAP,  in the last 168 hours  If 7PM-7AM, please contact night-coverage [email or web address] Password [facility] 02/25/2013, 10:29 AM

## 2013-02-26 ENCOUNTER — Inpatient Hospital Stay (HOSPITAL_COMMUNITY): Payer: Medicare Other

## 2013-02-26 DIAGNOSIS — R112 Nausea with vomiting, unspecified: Secondary | ICD-10-CM | POA: Diagnosis not present

## 2013-02-26 DIAGNOSIS — R197 Diarrhea, unspecified: Secondary | ICD-10-CM | POA: Diagnosis not present

## 2013-02-26 DIAGNOSIS — R141 Gas pain: Secondary | ICD-10-CM | POA: Diagnosis not present

## 2013-02-26 DIAGNOSIS — R109 Unspecified abdominal pain: Secondary | ICD-10-CM | POA: Diagnosis not present

## 2013-02-26 LAB — URINE CULTURE
Colony Count: NO GROWTH
Culture: NO GROWTH

## 2013-02-26 LAB — CBC
HEMATOCRIT: 32 % — AB (ref 36.0–46.0)
HEMOGLOBIN: 10.4 g/dL — AB (ref 12.0–15.0)
MCH: 29.3 pg (ref 26.0–34.0)
MCHC: 32.5 g/dL (ref 30.0–36.0)
MCV: 90.1 fL (ref 78.0–100.0)
Platelets: 189 10*3/uL (ref 150–400)
RBC: 3.55 MIL/uL — ABNORMAL LOW (ref 3.87–5.11)
RDW: 14.6 % (ref 11.5–15.5)
WBC: 5 10*3/uL (ref 4.0–10.5)

## 2013-02-26 LAB — COMPREHENSIVE METABOLIC PANEL
ALK PHOS: 66 U/L (ref 39–117)
ALT: 16 U/L (ref 0–35)
AST: 20 U/L (ref 0–37)
Albumin: 2.9 g/dL — ABNORMAL LOW (ref 3.5–5.2)
BUN: 11 mg/dL (ref 6–23)
CALCIUM: 8.4 mg/dL (ref 8.4–10.5)
CO2: 26 mEq/L (ref 19–32)
Chloride: 99 mEq/L (ref 96–112)
Creatinine, Ser: 0.61 mg/dL (ref 0.50–1.10)
GFR calc non Af Amer: 77 mL/min — ABNORMAL LOW (ref >90)
GFR, EST AFRICAN AMERICAN: 90 mL/min — AB (ref >90)
GLUCOSE: 95 mg/dL (ref 70–99)
POTASSIUM: 3.7 meq/L (ref 3.7–5.3)
SODIUM: 133 meq/L — AB (ref 137–147)
TOTAL PROTEIN: 5.8 g/dL — AB (ref 6.0–8.3)
Total Bilirubin: 0.3 mg/dL (ref 0.3–1.2)

## 2013-02-26 MED ORDER — PANTOPRAZOLE SODIUM 40 MG PO TBEC
40.0000 mg | DELAYED_RELEASE_TABLET | Freq: Every day | ORAL | Status: DC
Start: 2013-02-26 — End: 2013-03-22

## 2013-02-26 MED ORDER — ONDANSETRON HCL 4 MG PO TABS: 4.0000 mg | ORAL_TABLET | Freq: Three times a day (TID) | ORAL | Status: DC | PRN

## 2013-02-26 MED ORDER — ASPIRIN 81 MG PO TABS: 81.0000 mg | ORAL_TABLET | Freq: Every day | ORAL | Status: DC

## 2013-02-26 NOTE — Discharge Instructions (Signed)
Follow with Primary MD GREEN, Cassandra Spare, MD in 7 days   Get CBC, CMP, checked 7 days by Primary MD and again as instructed by your Primary MD.     Activity: As tolerated with Full fall precautions use walker/cane & assistance as needed   Disposition Ass Living   Diet: Heart Healthy   For Heart failure patients - Check your Weight same time everyday, if you gain over 2 pounds, or you develop in leg swelling, experience more shortness of breath or chest pain, call your Primary MD immediately. Follow Cardiac Low Salt Diet and 1.8 lit/day fluid restriction.   On your next visit with her primary care physician please Get Medicines reviewed and adjusted.  Please request your Prim.MD to go over all Hospital Tests and Procedure/Radiological results at the follow up, please get all Hospital records sent to your Prim MD by signing hospital release before you go home.   If you experience worsening of your admission symptoms, develop shortness of breath, life threatening emergency, suicidal or homicidal thoughts you must seek medical attention immediately by calling 911 or calling your MD immediately  if symptoms less severe.  You Must read complete instructions/literature along with all the possible adverse reactions/side effects for all the Medicines you take and that have been prescribed to you. Take any new Medicines after you have completely understood and accpet all the possible adverse reactions/side effects.   Do not drive and provide baby sitting services if your were admitted for syncope or siezures until you have seen by Primary MD or a Neurologist and advised to do so again.  Do not drive when taking Pain medications.    Do not take more than prescribed Pain, Sleep and Anxiety Medications  Special Instructions: If you have smoked or chewed Tobacco  in the last 2 yrs please stop smoking, stop any regular Alcohol  and or any Recreational drug use.  Wear Seat belts while  driving.   Please note  You were cared for by a hospitalist during your hospital stay. If you have any questions about your discharge medications or the care you received while you were in the hospital after you are discharged, you can call the unit and asked to speak with the hospitalist on call if the hospitalist that took care of you is not available. Once you are discharged, your primary care physician will handle any further medical issues. Please note that NO REFILLS for any discharge medications will be authorized once you are discharged, as it is imperative that you return to your primary care physician (or establish a relationship with a primary care physician if you do not have one) for your aftercare needs so that they can reassess your need for medications and monitor your lab values.

## 2013-02-26 NOTE — Progress Notes (Signed)
Patient discharged to [facility], Independent Living wing. Patient AVS reviewed with patient and patient's daughter. Patient and daughter verbalized understanding of medications and follow-up appointments.  Patient remains stable; no signs or symptoms of distress.  Patient educated to return to the ER in cases of SOB, dizziness, fever, chest pain, or fainting. Patient transported by daughter, with her belongings at her side.

## 2013-02-26 NOTE — Discharge Summary (Signed)
Cassandra Glenn, is a 80 y.o. female  DOB 29-Jun-1920  MRN [id number].  Admission date:  02/25/2013  Admitting Physician  Robbie Lis, MD  Discharge Date:  02/26/2013   Primary MD  Estill Dooms, MD  Recommendations for primary care physician for things to follow:   Monitor nausea vomiting symptoms, she gives a history of gradual abdominal distention for the last several months. Question if she be worked up for any ovarian malignancy  In the outpatient setting   Admission Diagnosis  Viral gastroenteritis [008.8] Fever [780.60] Anemia, chronic disease [285.29] Nausea vomiting and diarrhea [787.91, 787.01]   Discharge Diagnosis  Viral gastroenteritis [008.8] Fever [780.60] Anemia, chronic disease [285.29] Nausea vomiting and diarrhea [787.91, 787.01]   Principal Problem:   Nausea vomiting and diarrhea Active Problems:   Anemia, chronic disease   Viral gastroenteritis   Leukocytosis      Past Medical History  Diagnosis Date  . Cancer   . Macular degeneration   . HOH (hard of hearing)   . Vaginitis and vulvovaginitis 02/29/2012  . Abnormalities of the hair 02/29/2012  . Spinal stenosis, lumbar region, with neurogenic claudication 11/02/2011  . Trigger finger (acquired) 11/02/2011  . Right bundle branch block 08/30/2011  . Edema 05/07/2011  . Contact dermatitis and other eczema, due to unspecified cause 05/07/2011  . Hypopotassemia 03/26/2011  . Candidiasis of skin and nails 03/19/2011  . Hyposmolality and/or hyponatremia 03/19/2011  . Acute posthemorrhagic anemia 03/19/2011  . Diverticulosis of colon (without mention of hemorrhage) 03/19/2011  . Unspecified constipation 03/19/2011  . Pain in joint, pelvic region and thigh 03/19/2011  . Lumbago 03/19/2011  . Spasm of muscle 03/19/2011  . Osteoporosis,  unspecified 03/19/2011  . Insomnia, unspecified 03/19/2011  . Other malaise and fatigue 03/19/2011  . Impaired fasting glucose 03/19/2011  . Coronary atherosclerosis of native coronary artery 03/18/2011  . Hypotension, unspecified 03/18/2011  . Closed fracture of base of neck of femur 03/18/2011  . Malignant neoplasm of breast (female), unspecified site 10/27/2004  . Arthritis   . Shortness of breath     Past Surgical History  Procedure Laterality Date  . Cataract extraction  2010    bialteral  . Hip pinning,cannulated  03/08/2011    Procedure: CANNULATED HIP PINNING;  Surgeon: Johnn Hai, MD;  Location: [facility];  Service: Orthopedics;  Laterality: Right;  . Eye surgery      cataract  . Fracture surgery Left 09/1980    ankle  . Breast surgery Bilateral 2005-10/27/2004    lumpectomy- Streck,MD  . Squamous cell carcinoma excision Right 02/04/2011    forearm- Taffeen, MD  . Orif hip fracture Right 03/08/2011    Bean,MD  . Skin lesion excision Right 02/04/2011    Abdomen lesion spongiotic dermatitis-Taffeen, MD  . Skin cancer excision Left 10/12/2012    lower leg Dr. [place]     Discharge Condition: Stable   Follow UP  Follow-up Information   Follow up with GREEN, Viviann Spare, MD. Schedule an appointment as soon  as possible for a visit in 1 week.   Specialty:  Internal Medicine   Contact information:   [place] [address] [phone]       Follow up with Silvano Rusk, MD. Schedule an appointment as soon as possible for a visit in 1 week.   Specialty:  Gastroenterology   Contact information:   [address] [place] [address] [phone]         Discharge Instructions  and  Discharge Medications          Discharge Orders   Future Appointments Provider Department Dept Phone   04/24/2013 9:15 AM Estill Dooms, MD [facility] [phone]   Future Orders Complete By Expires   Diet - low sodium heart healthy  As directed      Discharge instructions  As directed    Comments:     Follow with Primary MD GREEN, Viviann Spare, MD in 7 days , discussed with him her issues of gradual abdominal bloating over the last several months.  Get CBC, CMP, checked 7 days by Primary MD and again as instructed by your Primary MD.     Activity: As tolerated with Full fall precautions use walker/cane & assistance as needed   Disposition Ass Living   Diet: Heart Healthy   For Heart failure patients - Check your Weight same time everyday, if you gain over 2 pounds, or you develop in leg swelling, experience more shortness of breath or chest pain, call your Primary MD immediately. Follow Cardiac Low Salt Diet and 1.8 lit/day fluid restriction.   On your next visit with her primary care physician please Get Medicines reviewed and adjusted.  Please request your Prim.MD to go over all Hospital Tests and Procedure/Radiological results at the follow up, please get all Hospital records sent to your Prim MD by signing hospital release before you go home.   If you experience worsening of your admission symptoms, develop shortness of breath, life threatening emergency, suicidal or homicidal thoughts you must seek medical attention immediately by calling 911 or calling your MD immediately  if symptoms less severe.  You Must read complete instructions/literature along with all the possible adverse reactions/side effects for all the Medicines you take and that have been prescribed to you. Take any new Medicines after you have completely understood and accpet all the possible adverse reactions/side effects.   Do not drive and provide baby sitting services if your were admitted for syncope or siezures until you have seen by Primary MD or a Neurologist and advised to do so again.  Do not drive when taking Pain medications.    Do not take more than prescribed Pain, Sleep and Anxiety Medications  Special Instructions: If you have smoked or chewed  Tobacco  in the last 2 yrs please stop smoking, stop any regular Alcohol  and or any Recreational drug use.  Wear Seat belts while driving.   Please note  You were cared for by a hospitalist during your hospital stay. If you have any questions about your discharge medications or the care you received while you were in the hospital after you are discharged, you can call the unit and asked to speak with the hospitalist on call if the hospitalist that took care of you is not available. Once you are discharged, your primary care physician will handle any further medical issues. Please note that NO REFILLS for any discharge medications will be authorized once you are discharged, as  it is imperative that you return to your primary care physician (or establish a relationship with a primary care physician if you do not have one) for your aftercare needs so that they can reassess your need for medications and monitor your lab values.   Increase activity slowly  As directed        Medication List         acetaminophen 500 MG tablet  Commonly known as:  TYLENOL  Take 500 mg by mouth every 6 (six) hours as needed for pain.     aspirin 81 MG tablet  Take 1 tablet (81 mg total) by mouth daily.  Start taking on:  03/01/2013     Bilberry 1000 MG Caps  Take 1 capsule by mouth daily.     CALCIUM 600/VITAMIN D PO  Take by mouth 2 (two) times daily.     cholecalciferol 400 UNITS Tabs tablet  Commonly known as:  VITAMIN D  Take 400 Units by mouth daily. Take 1 tablet daily for vitamin supplement.     FIBERCON PO  Take 2 tablets by mouth daily.     fish oil-omega-3 fatty acids 1000 MG capsule  Take 2 g by mouth daily. Take 1 capsule daily for vitamin supplement.     GLUCOSAMINE PLUS VITAMIN D 1500-200 MG-UNIT Tabs  Take 1 tablet by mouth 2 (two) times daily. Take 1 tablet twice daily for joint health     HYDROcodone-acetaminophen 5-325 MG per tablet  Commonly known as:  NORCO/VICODIN  Take 1-2  tablets by mouth every 4 (four) hours as needed for moderate pain.     Lutein 6 MG Tabs  Take 1 tablet by mouth daily.     ondansetron 4 MG tablet  Commonly known as:  ZOFRAN  Take 1 tablet (4 mg total) by mouth every 8 (eight) hours as needed for nausea or vomiting.     pantoprazole 40 MG tablet  Commonly known as:  PROTONIX  Take 1 tablet (40 mg total) by mouth daily. Switch for any other PPI at similar dose and frequency     PRESERVISION AREDS 2 Caps  Take 2 capsules by mouth daily.     STRESS FORMULA 500/ZINC Tabs  Take 1 tablet by mouth daily.     RESTORA Caps  Take 1 capsule by mouth daily.     zolpidem 5 MG tablet  Commonly known as:  AMBIEN  Take 2.5 mg by mouth at bedtime as needed for sleep.          Diet and Activity recommendation: See Discharge Instructions above   Consults obtained -    Major procedures and Radiology Reports - PLEASE review detailed and final reports for all details, in brief -       US Abdomen Limited  02/26/2013   CLINICAL DATA:  Abdominal distention.  Evaluate for ascites.  EXAM: LIMITED ABDOMEN ULTRASOUND FOR ASCITES  TECHNIQUE: Limited ultrasound survey for ascites was performed in all four abdominal quadrants.  COMPARISON:  None.  FINDINGS: Limited survey views of all 4 abdominal quadrants shows no evidence of ascites.  IMPRESSION: No evidence of ascites.   Electronically Signed   By: Earle Gell M.D.   On: 02/26/2013 08:15   Dg Abd Acute W/chest  02/25/2013   CLINICAL DATA:  Vomiting.  EXAM: ACUTE ABDOMEN SERIES (ABDOMEN 2 VIEW & CHEST 1 VIEW)  COMPARISON:  11/23/2012 and 11/27/2012  FINDINGS: Lungs are somewhat hyperinflated with stable chronic opacification over the right apex.  Remainder of the lungs are clear. There is borderline cardiomegaly unchanged. There is calcified plaque over the thoracic aorta.  Abdominal images demonstrate no evidence of free peritoneal air. Bowel gas pattern demonstrates a prominent air-filled loop of  bowel in the central abdomen likely sigmoid colon and measuring 7.8 cm in diameter. There is an air-filled bowel loops superimposed on this presumed prominent sigmoid loop which is difficult to distinguish whether this is a loop of colon versus mildly dilated small bowel. There is mild fecal retention throughout the colon. There is moderate degenerative changes as spine and hips. Hardware is present over the right hip. There is no change in a approximate T11 compression fracture.  IMPRESSION: Air-filled prominent bowel loop in the central abdomen likely minimally distended sigmoid colon. Mild fecal retention throughout the colon. Possible minimally dilated small bowel loop versus normal colonic loop also over the central abdomen. Recommend followup abdominal films as clinically indicated.  No acute cardiopulmonary disease.  Stable T11 compression fracture.  Chronic focal opacification right apex.   Electronically Signed   By: Marin Olp M.D.   On: 02/25/2013 09:20   Dg Abd Portable 1v  02/26/2013   CLINICAL DATA:  Abdominal pain, nausea and vomiting  EXAM: PORTABLE ABDOMEN - 1 VIEW  COMPARISON:  DG ABD ACUTE W/CHEST dated 02/25/2013  FINDINGS: Significant sigmoid scoliosis of the thoracolumbar spine with significant T12 compression age uncertain, new from [id number]. There is stool throughout the distal transverse colon, descending colon, and into the sigmoid colon. There are no abnormally dilated loops of bowel.  IMPRESSION: Nonobstructive bowel gas pattern. Other incidental findings described above.   Electronically Signed   By: Skipper Cliche M.D.   On: 02/26/2013 07:55    Micro Results      No results found for this or any previous visit (from the past 240 hour(s)).   History of present illness and  Hospital Course:     Kindly see H&P for history of present illness and admission details, please review complete Labs, Consult reports and Test reports for all details in brief Cassandra Glenn, is a 80  y.o. female, patient with history of  CAD, vaginitis, hearing loss, right bundle branch block, diverticulosis of colon who lives in assisted living facility was brought in after multiple episodes of nausea vomiting which happened yesterday and have since resolved, after her last episode of emesis she noticed some trace blood in her vomitus. In my opinion she had viral gastroenteritis causing nausea vomiting and maybe small Mallory-Weiss tear.   Her workup today is unremarkable, she has tolerated her morning breakfast without any problems, repeat KUB is morning and abdominal ultrasound are stable. She is symptom free. Will be discharged home on Zofran and PPI. One time outpatient followup with GI postdischarge is recommended.   Of note patient gives a history of gradual abdominal bloating over the last several months and I will request PCP to consider ovarian malignancy workup in the outpatient setting. I have discussed this with her daughter and she will set up an appointment with patient's GYN physician in the next one week.    For her chronic medical issues she will continue her home medications unchanged. We'll hold aspirin for the next 3 days. Due to nausea vomiting with likely a small Mallory-Weiss tear.         Today   Subjective:   Braxton Feathers today has no headache,no chest abdominal pain,no new weakness tingling or numbness, feels much better wants to go  home today.   Objective:   Blood pressure 126/67, pulse 72, temperature 98.2 F (36.8 C), temperature source Oral, resp. rate 18, height 5\' 1"  (1.549 m), weight 59.285 kg (130 lb 11.2 oz), SpO2 96.00%.   Intake/Output Summary (Last 24 hours) at 02/26/13 1024 Last data filed at 02/26/13 0900  Gross per 24 hour  Intake    480 ml  Output   1501 ml  Net  -1021 ml    Exam Awake Alert, Oriented *3, No new F.N deficits, Normal affect Burt.AT,PERRAL Supple Neck,No JVD, No cervical lymphadenopathy appriciated.  Symmetrical  Chest wall movement, Good air movement bilaterally, CTAB RRR,No Gallops,Rubs or new Murmurs, No Parasternal Heave +ve B.Sounds, Abd Soft, Non tender, mildly protuberant, No organomegaly appriciated, No rebound -guarding or rigidity. No Cyanosis, Clubbing or edema, No new Rash or bruise  Data Review   CBC w Diff: Lab Results  Component Value Date   WBC 5.0 02/26/2013   WBC 5.1 12/18/2012   WBC 7.6 01/13/2010   HGB 10.4* 02/26/2013   HGB 12.6 01/13/2010   HCT 32.0* 02/26/2013   HCT 36.5 01/13/2010   PLT 189 02/26/2013   PLT 181 01/13/2010   LYMPHOPCT 10* 02/25/2013   LYMPHOPCT 33.4 01/13/2010   MONOPCT 4 02/25/2013   MONOPCT 8.0 01/13/2010   EOSPCT 1 02/25/2013   EOSPCT 3.4 01/13/2010   BASOPCT 0 02/25/2013   BASOPCT 0.5 01/13/2010    CMP: Lab Results  Component Value Date   NA 133* 02/26/2013   NA 136* 12/18/2012   K 3.7 02/26/2013   CL 99 02/26/2013   CO2 26 02/26/2013   BUN 11 02/26/2013   BUN 9 12/18/2012   CREATININE 0.61 02/26/2013   CREATININE 0.6 12/18/2012   GLU 107 12/18/2012   PROT 5.8* 02/26/2013   ALBUMIN 2.9* 02/26/2013   BILITOT 0.3 02/26/2013   ALKPHOS 66 02/26/2013   AST 20 02/26/2013   ALT 16 02/26/2013  .   Total Time in preparing paper work, data evaluation and todays exam - 35 minutes  Thurnell Lose M.D on 02/26/2013 at 10:24 AM  [facility] Office  [phone]

## 2013-03-01 DIAGNOSIS — IMO0002 Reserved for concepts with insufficient information to code with codable children: Secondary | ICD-10-CM | POA: Diagnosis not present

## 2013-03-01 DIAGNOSIS — M6281 Muscle weakness (generalized): Secondary | ICD-10-CM | POA: Diagnosis not present

## 2013-03-01 DIAGNOSIS — R279 Unspecified lack of coordination: Secondary | ICD-10-CM | POA: Diagnosis not present

## 2013-03-01 DIAGNOSIS — R262 Difficulty in walking, not elsewhere classified: Secondary | ICD-10-CM | POA: Diagnosis not present

## 2013-03-01 DIAGNOSIS — R269 Unspecified abnormalities of gait and mobility: Secondary | ICD-10-CM | POA: Diagnosis not present

## 2013-03-06 NOTE — Telephone Encounter (Signed)
Patient has been seen.

## 2013-03-07 ENCOUNTER — Ambulatory Visit (INDEPENDENT_AMBULATORY_CARE_PROVIDER_SITE_OTHER): Payer: Medicare Other | Admitting: Internal Medicine

## 2013-03-07 ENCOUNTER — Encounter: Payer: Self-pay | Admitting: Internal Medicine

## 2013-03-07 VITALS — BP 140/80 | HR 89 | Temp 97.4°F | Resp 18 | Ht 61.0 in | Wt 123.8 lb

## 2013-03-07 DIAGNOSIS — A084 Viral intestinal infection, unspecified: Secondary | ICD-10-CM

## 2013-03-07 DIAGNOSIS — L600 Ingrowing nail: Secondary | ICD-10-CM

## 2013-03-07 DIAGNOSIS — A088 Other specified intestinal infections: Secondary | ICD-10-CM | POA: Diagnosis not present

## 2013-03-07 NOTE — Progress Notes (Signed)
Patient ID: Cassandra Glenn, female   DOB: 05-Oct-1920, 80 y.o.   MRN: [id number]    Location:    [facility]  Place of Service:  OFFICE    Allergies  Allergen Reactions  . Macrodantin [Nitrofurantoin Macrocrystal]     unknown  . Septra [Sulfamethoxazole-Trimethoprim]     unknown    Chief Complaint  Patient presents with  . Hospitalization Follow-up    HPI:  Hospitalized 02/25/13 through 02/26/13 for gastroenteritis. She is not feeling well. She is still weak and tired, but is eating OK and has not had further nausea.   She would like to stop Protonix and Zofran.   She still feels the abd is bloated and distended. She has an appt with Dr. Matthew Saras to check ovaries 03/14/13.  Right great toe is still irritated and she seems to have an ingrown nail medially.  Medications: Patient's Medications  New Prescriptions   No medications on file  Previous Medications   ACETAMINOPHEN (TYLENOL) 500 MG TABLET    Take 500 mg by mouth every 6 (six) hours as needed for pain.   ASPIRIN 81 MG TABLET    Take 1 tablet (81 mg total) by mouth daily.   BILBERRY 1000 MG CAPS    Take 1 capsule by mouth daily.   CALCIUM CARBONATE-VITAMIN D (CALCIUM 600/VITAMIN D PO)    Take by mouth 2 (two) times daily.   CALCIUM POLYCARBOPHIL (FIBERCON PO)    Take 2 tablets by mouth daily.   CHOLECALCIFEROL (VITAMIN D) 400 UNITS TABS    Take 400 Units by mouth daily. Take 1 tablet daily for vitamin supplement.   FISH OIL-OMEGA-3 FATTY ACIDS 1000 MG CAPSULE    Take 2 g by mouth daily. Take 1 capsule daily for vitamin supplement.   GLUCOSAMINE-VITAMIN D (GLUCOSAMINE PLUS VITAMIN D) 1500-200 MG-UNIT TABS    Take 1 tablet by mouth 2 (two) times daily. Take 1 tablet twice daily for joint health   HYDROCODONE-ACETAMINOPHEN (NORCO/VICODIN) 5-325 MG PER TABLET    Take 1-2 tablets by mouth every 4 (four) hours as needed for moderate pain.   LUTEIN 6 MG TABS    Take 1 tablet by mouth daily.   MULTIPLE VITAMINS-MINERALS (PRESERVISION  AREDS 2) CAPS    Take 2 capsules by mouth daily.   MULTIPLE VITAMINS-MINERALS (STRESS FORMULA 500/ZINC) TABS    Take 1 tablet by mouth daily.   NYSTATIN (MYCOSTATIN) 100000 UNIT/ML SUSPENSION       ONDANSETRON (ZOFRAN) 4 MG TABLET    Take 1 tablet (4 mg total) by mouth every 8 (eight) hours as needed for nausea or vomiting.   PANTOPRAZOLE (PROTONIX) 40 MG TABLET    Take 1 tablet (40 mg total) by mouth daily. Switch for any other PPI at similar dose and frequency   PROBIOTIC PRODUCT (RESTORA) CAPS    Take 1 capsule by mouth daily.   ZOLPIDEM (AMBIEN) 5 MG TABLET    Take 2.5 mg by mouth at bedtime as needed for sleep.  Modified Medications   No medications on file  Discontinued Medications   No medications on file     Review of Systems  Constitutional: Negative for fever, activity change, fatigue and unexpected weight change.  HENT: Positive for hearing loss. Negative for ear pain.   Eyes:       History of loss of visual acuity and macular degeneration.  Respiratory:       History of breast discomfort. History of removal of a lump from the right  breast.  Cardiovascular: Negative for chest pain, palpitations and leg swelling.  Gastrointestinal:       Chronic sluggish stools and straining. She frequently complains that her intestines are not working right. There is a previous history of diverticulosis. Her last colonoscopy was in 2008. She has been seen by Dr. Paulita Fujita in the past. Complains of bloated feeling.  Genitourinary:       Complains of a slow, weak urinary stream. Denies dysuria, frequency, or incontinence. Vaginal discomfort. Managed by Dr. Claudean Kinds.  Musculoskeletal:       Chronic back pain. Pain down the right leg. Some restriction of joint motion of the right hip.. She feels that there is something loose in the right hip area. There is generalized stiffness in the joints. There is a history of spinal stenosis. Left third finger catches on extension sometimes. There is a trigger  finger. Pain at the left iliac crest and left anterior ribs near the sternum.  Skin:       History of skin discoloration and changes of nails. Previous history of folliculitis of the left lower leg. Folliculitis has cleared. Denies itching or rash.  Allergic/Immunologic: Negative.   Neurological: Negative for dizziness, tremors, seizures and numbness.       History of spinal stenosis with neurogenic claudication affecting the right leg. Complains of pain in the buttocks that is suggestive of neuralgia from her spinal stenosis.  Hematological: Bruises/bleeds easily.  Psychiatric/Behavioral: Negative.     Filed Vitals:   03/07/13 1622  BP: 140/80  Pulse: 89  Temp: 97.4 F (36.3 C)  TempSrc: Oral  Resp: 18  Height: 5' 1"  (1.549 m)  Weight: 123 lb 12.8 oz (56.155 kg)  SpO2: 96%   Physical Exam  Constitutional: She is oriented to person, place, and time. She appears well-developed and well-nourished. No distress.  HENT:  Head: Normocephalic and atraumatic.  Right Ear: External ear normal.  Left Ear: External ear normal.  Nose: Nose normal.  Mouth/Throat: Oropharynx is clear and moist. No oropharyngeal exudate.  Eyes: Conjunctivae and EOM are normal. Pupils are equal, round, and reactive to light. Right eye exhibits no discharge. Left eye exhibits no discharge. No scleral icterus.  Neck: Normal range of motion. Neck supple. No JVD present. No tracheal deviation present. No thyromegaly present.  Cardiovascular: Normal rate, regular rhythm, normal heart sounds and intact distal pulses.   No murmur heard. Pulmonary/Chest: Effort normal and breath sounds normal. No stridor. No respiratory distress. She has no wheezes. She has no rales. She exhibits no tenderness.  Abdominal: Soft. Bowel sounds are normal. She exhibits no distension. There is no tenderness. There is no rebound and no guarding.  Musculoskeletal: Normal range of motion. She exhibits tenderness. She exhibits no edema.    Anterior left lower rib cage pain near the sternum. Tender in the left SI joint area and along the left iliac crest.  Lymphadenopathy:    She has no cervical adenopathy.  Neurological: She is alert and oriented to person, place, and time. She has normal reflexes. No cranial nerve deficit. She exhibits normal muscle tone. Coordination normal.  Skin: Skin is warm and dry. No rash noted. She is not diaphoretic. No erythema.  Scaly area on the right shoulder blade.  Ingrown right great toenail with tenderness laterally and swelling of the paronychia.  Psychiatric: She has a normal mood and affect. Her behavior is normal. Judgment and thought content normal.     Labs reviewed: Admission on 02/25/2013, Discharged on 02/26/2013  Component Date Value Ref Range Status  . WBC 02/25/2013 10.2  4.0 - 10.5 K/uL Final  . RBC 02/25/2013 3.82* 3.87 - 5.11 MIL/uL Final  . Hemoglobin 02/25/2013 11.5* 12.0 - 15.0 g/dL Final  . HCT 02/25/2013 34.2* 36.0 - 46.0 % Final  . MCV 02/25/2013 89.5  78.0 - 100.0 fL Final  . MCH 02/25/2013 30.1  26.0 - 34.0 pg Final  . MCHC 02/25/2013 33.6  30.0 - 36.0 g/dL Final  . RDW 02/25/2013 14.4  11.5 - 15.5 % Final  . Platelets 02/25/2013 213  150 - 400 K/uL Final  . Neutrophils Relative % 02/25/2013 82* 43 - 77 % Final  . Neutro Abs 02/25/2013 8.3* 1.7 - 7.7 K/uL Final  . Lymphocytes Relative 02/25/2013 8* 12 - 46 % Final  . Lymphs Abs 02/25/2013 0.8  0.7 - 4.0 K/uL Final  . Monocytes Relative 02/25/2013 8  3 - 12 % Final  . Monocytes Absolute 02/25/2013 0.8  0.1 - 1.0 K/uL Final  . Eosinophils Relative 02/25/2013 3  0 - 5 % Final  . Eosinophils Absolute 02/25/2013 0.3  0.0 - 0.7 K/uL Final  . Basophils Relative 02/25/2013 0  0 - 1 % Final  . Basophils Absolute 02/25/2013 0.0  0.0 - 0.1 K/uL Final  . Sodium 02/25/2013 134* 137 - 147 mEq/L Final  . Potassium 02/25/2013 4.1  3.7 - 5.3 mEq/L Final  . Chloride 02/25/2013 98  96 - 112 mEq/L Final  . CO2 02/25/2013 23   19 - 32 mEq/L Final  . Glucose, Bld 02/25/2013 161* 70 - 99 mg/dL Final  . BUN 02/25/2013 17  6 - 23 mg/dL Final  . Creatinine, Ser 02/25/2013 0.60  0.50 - 1.10 mg/dL Final  . Calcium 02/25/2013 8.8  8.4 - 10.5 mg/dL Final  . Total Protein 02/25/2013 6.7  6.0 - 8.3 g/dL Final  . Albumin 02/25/2013 3.6  3.5 - 5.2 g/dL Final  . AST 02/25/2013 29  0 - 37 U/L Final  . ALT 02/25/2013 20  0 - 35 U/L Final  . Alkaline Phosphatase 02/25/2013 85  39 - 117 U/L Final  . Total Bilirubin 02/25/2013 0.4  0.3 - 1.2 mg/dL Final  . GFR calc non Af Amer 02/25/2013 78* >90 mL/min Final  . GFR calc Af Amer 02/25/2013 >90  >90 mL/min Final   Comment: (NOTE)                          The eGFR has been calculated using the CKD EPI equation.                          This calculation has not been validated in all clinical situations.                          eGFR's persistently <90 mL/min signify possible Chronic Kidney                          Disease.  . Lipase 02/25/2013 22  11 - 59 U/L Final  . Color, Urine 02/25/2013 YELLOW  YELLOW Final  . APPearance 02/25/2013 CLEAR  CLEAR Final  . Specific Gravity, Urine 02/25/2013 1.016  1.005 - 1.030 Final  . pH 02/25/2013 7.0  5.0 - 8.0 Final  . Glucose, UA 02/25/2013 NEGATIVE  NEGATIVE mg/dL Final  . Hgb urine dipstick  02/25/2013 NEGATIVE  NEGATIVE Final  . Bilirubin Urine 02/25/2013 NEGATIVE  NEGATIVE Final  . Ketones, ur 02/25/2013 NEGATIVE  NEGATIVE mg/dL Final  . Protein, ur 02/25/2013 NEGATIVE  NEGATIVE mg/dL Final  . Urobilinogen, UA 02/25/2013 0.2  0.0 - 1.0 mg/dL Final  . Nitrite 02/25/2013 NEGATIVE  NEGATIVE Final  . Leukocytes, UA 02/25/2013 TRACE* NEGATIVE Final  . ABO/RH(D) 02/25/2013 A POS   Final  . Antibody Screen 02/25/2013 NEG   Final  . Sample Expiration 02/25/2013 02/28/2013   Final  . aPTT 02/25/2013 27  24 - 37 seconds Final  . Prothrombin Time 02/25/2013 12.5  11.6 - 15.2 seconds Final  . INR 02/25/2013 0.95  0.00 - 1.49 Final  . Lactic  Acid, Venous 02/25/2013 0.91  0.5 - 2.2 mmol/L Final  . ABO/RH(D) 02/25/2013 A POS   Final  . Fecal Occult Bld 02/25/2013 NEGATIVE  NEGATIVE Final  . WBC 02/25/2013 12.1* 4.0 - 10.5 K/uL Final  . RBC 02/25/2013 4.08  3.87 - 5.11 MIL/uL Final  . Hemoglobin 02/25/2013 12.0  12.0 - 15.0 g/dL Final  . HCT 02/25/2013 36.8  36.0 - 46.0 % Final  . MCV 02/25/2013 90.2  78.0 - 100.0 fL Final  . MCH 02/25/2013 29.4  26.0 - 34.0 pg Final  . MCHC 02/25/2013 32.6  30.0 - 36.0 g/dL Final  . RDW 02/25/2013 14.4  11.5 - 15.5 % Final  . Platelets 02/25/2013 232  150 - 400 K/uL Final  . WBC, UA 02/25/2013 3-6  <3 WBC/hpf Final  . Sodium 02/25/2013 135* 137 - 147 mEq/L Final  . Potassium 02/25/2013 4.3  3.7 - 5.3 mEq/L Final  . Chloride 02/25/2013 97  96 - 112 mEq/L Final  . CO2 02/25/2013 24  19 - 32 mEq/L Final  . Glucose, Bld 02/25/2013 128* 70 - 99 mg/dL Final  . BUN 02/25/2013 15  6 - 23 mg/dL Final  . Creatinine, Ser 02/25/2013 0.59  0.50 - 1.10 mg/dL Final  . Calcium 02/25/2013 8.7  8.4 - 10.5 mg/dL Final  . Total Protein 02/25/2013 6.8  6.0 - 8.3 g/dL Final  . Albumin 02/25/2013 3.5  3.5 - 5.2 g/dL Final  . AST 02/25/2013 28  0 - 37 U/L Final  . ALT 02/25/2013 21  0 - 35 U/L Final  . Alkaline Phosphatase 02/25/2013 84  39 - 117 U/L Final  . Total Bilirubin 02/25/2013 0.5  0.3 - 1.2 mg/dL Final  . GFR calc non Af Amer 02/25/2013 78* >90 mL/min Final  . GFR calc Af Amer 02/25/2013 >90  >90 mL/min Final   Comment: (NOTE)                          The eGFR has been calculated using the CKD EPI equation.                          This calculation has not been validated in all clinical situations.                          eGFR's persistently <90 mL/min signify possible Chronic Kidney                          Disease.  . Magnesium 02/25/2013 2.1  1.5 - 2.5 mg/dL Final  . Phosphorus 02/25/2013 2.6  2.3 - 4.6 mg/dL Final  .  WBC 02/25/2013 8.4  4.0 - 10.5 K/uL Final  . RBC 02/25/2013 4.06  3.87 - 5.11  MIL/uL Final  . Hemoglobin 02/25/2013 12.0  12.0 - 15.0 g/dL Final  . HCT 02/25/2013 36.3  36.0 - 46.0 % Final  . MCV 02/25/2013 89.4  78.0 - 100.0 fL Final  . MCH 02/25/2013 29.6  26.0 - 34.0 pg Final  . MCHC 02/25/2013 33.1  30.0 - 36.0 g/dL Final  . RDW 02/25/2013 14.3  11.5 - 15.5 % Final  . Platelets 02/25/2013 211  150 - 400 K/uL Final  . Neutrophils Relative % 02/25/2013 85* 43 - 77 % Final  . Neutro Abs 02/25/2013 7.2  1.7 - 7.7 K/uL Final  . Lymphocytes Relative 02/25/2013 10* 12 - 46 % Final  . Lymphs Abs 02/25/2013 0.8  0.7 - 4.0 K/uL Final  . Monocytes Relative 02/25/2013 4  3 - 12 % Final  . Monocytes Absolute 02/25/2013 0.4  0.1 - 1.0 K/uL Final  . Eosinophils Relative 02/25/2013 1  0 - 5 % Final  . Eosinophils Absolute 02/25/2013 0.1  0.0 - 0.7 K/uL Final  . Basophils Relative 02/25/2013 0  0 - 1 % Final  . Basophils Absolute 02/25/2013 0.0  0.0 - 0.1 K/uL Final  . aPTT 02/25/2013 28  24 - 37 seconds Final  . Prothrombin Time 02/25/2013 12.9  11.6 - 15.2 seconds Final  . INR 02/25/2013 0.99  0.00 - 1.49 Final  . Specimen Description 02/25/2013 URINE, CLEAN CATCH   Final  . Special Requests 02/25/2013 NONE   Final  . Culture  Setup Time 02/25/2013    Final                   Value:02/25/2013 20:35                         Performed at [facility]  . Colony Count 02/25/2013    Final                   Value:NO GROWTH                         Performed at [facility]  . Culture 02/25/2013    Final                   Value:NO GROWTH                         Performed at [facility]  . Report Status 02/25/2013 02/26/2013 FINAL   Final  . Color, Urine 02/25/2013 YELLOW  YELLOW Final  . APPearance 02/25/2013 CLEAR  CLEAR Final  . Specific Gravity, Urine 02/25/2013 1.019  1.005 - 1.030 Final  . pH 02/25/2013 6.5  5.0 - 8.0 Final  . Glucose, UA 02/25/2013 NEGATIVE  NEGATIVE mg/dL Final  . Hgb urine dipstick 02/25/2013 MODERATE* NEGATIVE Final  .  Bilirubin Urine 02/25/2013 NEGATIVE  NEGATIVE Final  . Ketones, ur 02/25/2013 15* NEGATIVE mg/dL Final  . Protein, ur 02/25/2013 NEGATIVE  NEGATIVE mg/dL Final  . Urobilinogen, UA 02/25/2013 0.2  0.0 - 1.0 mg/dL Final  . Nitrite 02/25/2013 NEGATIVE  NEGATIVE Final  . Leukocytes, UA 02/25/2013 NEGATIVE  NEGATIVE Final  . Squamous Epithelial / LPF 02/25/2013 FEW* RARE Final  . WBC, UA 02/25/2013 3-6  <3 WBC/hpf Final  . RBC / HPF 02/25/2013 11-20  <3  RBC/hpf Final  . Sodium 02/26/2013 133* 137 - 147 mEq/L Final  . Potassium 02/26/2013 3.7  3.7 - 5.3 mEq/L Final  . Chloride 02/26/2013 99  96 - 112 mEq/L Final  . CO2 02/26/2013 26  19 - 32 mEq/L Final  . Glucose, Bld 02/26/2013 95  70 - 99 mg/dL Final  . BUN 02/26/2013 11  6 - 23 mg/dL Final  . Creatinine, Ser 02/26/2013 0.61  0.50 - 1.10 mg/dL Final  . Calcium 02/26/2013 8.4  8.4 - 10.5 mg/dL Final  . Total Protein 02/26/2013 5.8* 6.0 - 8.3 g/dL Final  . Albumin 02/26/2013 2.9* 3.5 - 5.2 g/dL Final  . AST 02/26/2013 20  0 - 37 U/L Final  . ALT 02/26/2013 16  0 - 35 U/L Final  . Alkaline Phosphatase 02/26/2013 66  39 - 117 U/L Final  . Total Bilirubin 02/26/2013 0.3  0.3 - 1.2 mg/dL Final  . GFR calc non Af Amer 02/26/2013 77* >90 mL/min Final  . GFR calc Af Amer 02/26/2013 90* >90 mL/min Final   Comment: (NOTE)                          The eGFR has been calculated using the CKD EPI equation.                          This calculation has not been validated in all clinical situations.                          eGFR's persistently <90 mL/min signify possible Chronic Kidney                          Disease.  . WBC 02/26/2013 5.0  4.0 - 10.5 K/uL Final  . RBC 02/26/2013 3.55* 3.87 - 5.11 MIL/uL Final  . Hemoglobin 02/26/2013 10.4* 12.0 - 15.0 g/dL Final  . HCT 02/26/2013 32.0* 36.0 - 46.0 % Final  . MCV 02/26/2013 90.1  78.0 - 100.0 fL Final  . MCH 02/26/2013 29.3  26.0 - 34.0 pg Final  . MCHC 02/26/2013 32.5  30.0 - 36.0 g/dL Final  .  RDW 02/26/2013 14.6  11.5 - 15.5 % Final  . Platelets 02/26/2013 189  150 - 400 K/uL Final  Nursing Home on 12/19/2012  Component Date Value Ref Range Status  . Hemoglobin 12/18/2012 11.3* 12.0 - 16.0 g/dL Final  . HCT 12/18/2012 34* 36 - 46 % Final  . Platelets 12/18/2012 298  150 - 399 K/L Final  . WBC 12/18/2012 5.1   Final  . Glucose 12/18/2012 107   Final  . BUN 12/18/2012 9  4 - 21 mg/dL Final  . Creatinine 12/18/2012 0.6  0.5 - 1.1 mg/dL Final  . Potassium 12/18/2012 4.4  3.4 - 5.3 mmol/L Final  . Sodium 12/18/2012 136* 137 - 147 mmol/L Final  . Alkaline Phosphatase 12/18/2012 101  25 - 125 U/L Final  . ALT 12/18/2012 10  7 - 35 U/L Final  . AST 12/18/2012 15  13 - 35 U/L Final  . Bilirubin, Total 12/18/2012 0.3   Final      Assessment/Plan  1. Viral gastroenteritis resolved  2. Ingrown nail See podiatrist

## 2013-03-07 NOTE — Patient Instructions (Signed)
Continue current medications. Consider letting me prescribe an antidepressant. Keep your mouth moist.

## 2013-03-09 DIAGNOSIS — L600 Ingrowing nail: Secondary | ICD-10-CM | POA: Diagnosis not present

## 2013-03-09 DIAGNOSIS — M79609 Pain in unspecified limb: Secondary | ICD-10-CM | POA: Diagnosis not present

## 2013-03-14 DIAGNOSIS — R142 Eructation: Secondary | ICD-10-CM | POA: Diagnosis not present

## 2013-03-14 DIAGNOSIS — Z01419 Encounter for gynecological examination (general) (routine) without abnormal findings: Secondary | ICD-10-CM | POA: Diagnosis not present

## 2013-03-14 DIAGNOSIS — Z853 Personal history of malignant neoplasm of breast: Secondary | ICD-10-CM | POA: Diagnosis not present

## 2013-03-14 DIAGNOSIS — R143 Flatulence: Secondary | ICD-10-CM | POA: Diagnosis not present

## 2013-03-14 DIAGNOSIS — R5381 Other malaise: Secondary | ICD-10-CM | POA: Diagnosis not present

## 2013-03-14 DIAGNOSIS — R5383 Other fatigue: Secondary | ICD-10-CM | POA: Diagnosis not present

## 2013-03-14 DIAGNOSIS — R141 Gas pain: Secondary | ICD-10-CM | POA: Diagnosis not present

## 2013-03-14 DIAGNOSIS — R109 Unspecified abdominal pain: Secondary | ICD-10-CM | POA: Diagnosis not present

## 2013-03-19 ENCOUNTER — Other Ambulatory Visit (HOSPITAL_COMMUNITY): Payer: Self-pay | Admitting: Obstetrics and Gynecology

## 2013-03-19 DIAGNOSIS — R1900 Intra-abdominal and pelvic swelling, mass and lump, unspecified site: Secondary | ICD-10-CM

## 2013-03-19 DIAGNOSIS — R971 Elevated cancer antigen 125 [CA 125]: Secondary | ICD-10-CM

## 2013-03-19 DIAGNOSIS — L608 Other nail disorders: Secondary | ICD-10-CM | POA: Diagnosis not present

## 2013-03-19 DIAGNOSIS — I739 Peripheral vascular disease, unspecified: Secondary | ICD-10-CM | POA: Diagnosis not present

## 2013-03-21 ENCOUNTER — Ambulatory Visit (HOSPITAL_COMMUNITY)
Admission: RE | Admit: 2013-03-21 | Discharge: 2013-03-21 | Disposition: A | Discharge status: Home / Self Care | Payer: Medicare Other | Source: Ambulatory Visit | Attending: Obstetrics and Gynecology | Admitting: Obstetrics and Gynecology

## 2013-03-21 DIAGNOSIS — R141 Gas pain: Secondary | ICD-10-CM | POA: Diagnosis not present

## 2013-03-21 DIAGNOSIS — R911 Solitary pulmonary nodule: Secondary | ICD-10-CM | POA: Diagnosis not present

## 2013-03-21 DIAGNOSIS — R29890 Loss of height: Secondary | ICD-10-CM | POA: Insufficient documentation

## 2013-03-21 DIAGNOSIS — S22009A Unspecified fracture of unspecified thoracic vertebra, initial encounter for closed fracture: Secondary | ICD-10-CM | POA: Diagnosis not present

## 2013-03-21 DIAGNOSIS — Z853 Personal history of malignant neoplasm of breast: Secondary | ICD-10-CM | POA: Insufficient documentation

## 2013-03-21 DIAGNOSIS — R109 Unspecified abdominal pain: Secondary | ICD-10-CM | POA: Diagnosis not present

## 2013-03-21 DIAGNOSIS — R143 Flatulence: Secondary | ICD-10-CM

## 2013-03-21 DIAGNOSIS — R971 Elevated cancer antigen 125 [CA 125]: Secondary | ICD-10-CM | POA: Diagnosis not present

## 2013-03-21 DIAGNOSIS — R142 Eructation: Secondary | ICD-10-CM | POA: Insufficient documentation

## 2013-03-21 DIAGNOSIS — R1900 Intra-abdominal and pelvic swelling, mass and lump, unspecified site: Secondary | ICD-10-CM

## 2013-03-21 MED ORDER — IOHEXOL 300 MG/ML  SOLN
100.0000 mL | Freq: Once | INTRAMUSCULAR | Status: AC | PRN
  Administered 2013-03-21: 100 mL via INTRAVENOUS

## 2013-03-22 DIAGNOSIS — L600 Ingrowing nail: Secondary | ICD-10-CM | POA: Insufficient documentation

## 2013-03-23 ENCOUNTER — Other Ambulatory Visit (HOSPITAL_COMMUNITY): Payer: Self-pay | Admitting: Obstetrics and Gynecology

## 2013-03-23 ENCOUNTER — Telehealth: Payer: Self-pay | Admitting: *Deleted

## 2013-03-23 DIAGNOSIS — R918 Other nonspecific abnormal finding of lung field: Secondary | ICD-10-CM

## 2013-03-23 DIAGNOSIS — R9389 Abnormal findings on diagnostic imaging of other specified body structures: Secondary | ICD-10-CM | POA: Insufficient documentation

## 2013-03-23 NOTE — Telephone Encounter (Signed)
Celexa 20 mg (30) One daily to help depression. 5 refills

## 2013-03-23 NOTE — Telephone Encounter (Signed)
Daughter requested something for depression for her mother, she states that you and her discussed it at her last visit. Please Advise!

## 2013-03-26 ENCOUNTER — Other Ambulatory Visit: Payer: Self-pay | Admitting: *Deleted

## 2013-03-26 MED ORDER — CITALOPRAM HYDROBROMIDE 20 MG PO TABS: ORAL_TABLET | ORAL | Status: DC

## 2013-03-26 MED ORDER — HYDROCODONE-ACETAMINOPHEN 5-325 MG PO TABS: 1.0000 | ORAL_TABLET | ORAL | Status: DC | PRN

## 2013-03-26 NOTE — Telephone Encounter (Signed)
Spoke with daughter, informed her that medication is at [facility] for mother. She requested a refill on her mother hydrocodone which was give to our triage Lost[facility] Rodena Piety) for her to pick up on Tues.(03/27/13).

## 2013-03-27 ENCOUNTER — Ambulatory Visit (HOSPITAL_COMMUNITY)
Admission: RE | Admit: 2013-03-27 | Discharge: 2013-03-27 | Disposition: A | Discharge status: Home / Self Care | Payer: Medicare Other | Source: Ambulatory Visit | Attending: Obstetrics and Gynecology | Admitting: Obstetrics and Gynecology

## 2013-03-27 DIAGNOSIS — R918 Other nonspecific abnormal finding of lung field: Secondary | ICD-10-CM

## 2013-03-27 DIAGNOSIS — I517 Cardiomegaly: Secondary | ICD-10-CM | POA: Insufficient documentation

## 2013-03-27 DIAGNOSIS — J984 Other disorders of lung: Secondary | ICD-10-CM | POA: Insufficient documentation

## 2013-03-27 MED ORDER — IOHEXOL 300 MG/ML  SOLN
100.0000 mL | Freq: Once | INTRAMUSCULAR | Status: AC | PRN
  Administered 2013-03-27: 100 mL via INTRAVENOUS

## 2013-04-02 DIAGNOSIS — T8189XA Other complications of procedures, not elsewhere classified, initial encounter: Secondary | ICD-10-CM | POA: Diagnosis not present

## 2013-04-09 ENCOUNTER — Telehealth: Payer: Self-pay | Admitting: *Deleted

## 2013-04-09 NOTE — Telephone Encounter (Signed)
03/21/2013-CT Abdomen and Pelvis with Contrast: (done at [facility]): Findings: Within the inferior visualized aspect of the right middle lobe there is a nodular density. Within the peripheral aspect of the right lower lobe there is a 0.5cm nodule. Normal heart size. No pericardial effusion. Liver is normal in size and contour without focal hepatic lesion identified. Portal vein is patent. Spleen,pancreas and bilateral adrenal glands are unremarkable. Common bile duct measures 55mm at the level the pancreatic head.Kidneys enhance symmetrically with contrast. No hydronephrosis. No pelvic (ovarian) mass. Forward copy to PCP along with recent labs. Suggest rpt CA 125 6 months. Follow up with PCP if abdomen bloating/fatigue continues. Per radiol>>>schedule chest CT   Per Dr. Nyoka Cowden: CT Chest done 03/27/2013; Nodules densities compatible with scarring/fibrosis. Stable since 2008

## 2013-04-16 ENCOUNTER — Encounter (HOSPITAL_COMMUNITY): Payer: Self-pay | Admitting: Emergency Medicine

## 2013-04-16 ENCOUNTER — Emergency Department (HOSPITAL_COMMUNITY)
Admission: EM | Admit: 2013-04-16 | Discharge: 2013-04-16 | Disposition: A | Discharge status: Home / Self Care | Payer: Medicare Other | Attending: Emergency Medicine | Admitting: Emergency Medicine

## 2013-04-16 DIAGNOSIS — I10 Essential (primary) hypertension: Secondary | ICD-10-CM | POA: Diagnosis not present

## 2013-04-16 DIAGNOSIS — Z853 Personal history of malignant neoplasm of breast: Secondary | ICD-10-CM | POA: Insufficient documentation

## 2013-04-16 DIAGNOSIS — D62 Acute posthemorrhagic anemia: Secondary | ICD-10-CM | POA: Insufficient documentation

## 2013-04-16 DIAGNOSIS — M7150 Other bursitis, not elsewhere classified, unspecified site: Secondary | ICD-10-CM | POA: Diagnosis not present

## 2013-04-16 DIAGNOSIS — Z862 Personal history of diseases of the blood and blood-forming organs and certain disorders involving the immune mechanism: Secondary | ICD-10-CM | POA: Diagnosis not present

## 2013-04-16 DIAGNOSIS — R110 Nausea: Secondary | ICD-10-CM | POA: Diagnosis not present

## 2013-04-16 DIAGNOSIS — I2510 Atherosclerotic heart disease of native coronary artery without angina pectoris: Secondary | ICD-10-CM | POA: Insufficient documentation

## 2013-04-16 DIAGNOSIS — Z79899 Other long term (current) drug therapy: Secondary | ICD-10-CM | POA: Diagnosis not present

## 2013-04-16 DIAGNOSIS — K5900 Constipation, unspecified: Secondary | ICD-10-CM | POA: Insufficient documentation

## 2013-04-16 DIAGNOSIS — Z8739 Personal history of other diseases of the musculoskeletal system and connective tissue: Secondary | ICD-10-CM | POA: Diagnosis not present

## 2013-04-16 DIAGNOSIS — Z7982 Long term (current) use of aspirin: Secondary | ICD-10-CM | POA: Insufficient documentation

## 2013-04-16 DIAGNOSIS — M129 Arthropathy, unspecified: Secondary | ICD-10-CM | POA: Insufficient documentation

## 2013-04-16 DIAGNOSIS — H9190 Unspecified hearing loss, unspecified ear: Secondary | ICD-10-CM | POA: Insufficient documentation

## 2013-04-16 DIAGNOSIS — Z8639 Personal history of other endocrine, nutritional and metabolic disease: Secondary | ICD-10-CM | POA: Insufficient documentation

## 2013-04-16 DIAGNOSIS — Z8781 Personal history of (healed) traumatic fracture: Secondary | ICD-10-CM | POA: Diagnosis not present

## 2013-04-16 DIAGNOSIS — M545 Low back pain, unspecified: Secondary | ICD-10-CM | POA: Diagnosis not present

## 2013-04-16 DIAGNOSIS — Z792 Long term (current) use of antibiotics: Secondary | ICD-10-CM | POA: Insufficient documentation

## 2013-04-16 DIAGNOSIS — Z87440 Personal history of urinary (tract) infections: Secondary | ICD-10-CM | POA: Insufficient documentation

## 2013-04-16 DIAGNOSIS — N390 Urinary tract infection, site not specified: Secondary | ICD-10-CM

## 2013-04-16 DIAGNOSIS — M810 Age-related osteoporosis without current pathological fracture: Secondary | ICD-10-CM | POA: Diagnosis not present

## 2013-04-16 DIAGNOSIS — Z872 Personal history of diseases of the skin and subcutaneous tissue: Secondary | ICD-10-CM | POA: Insufficient documentation

## 2013-04-16 DIAGNOSIS — G4700 Insomnia, unspecified: Secondary | ICD-10-CM | POA: Insufficient documentation

## 2013-04-16 DIAGNOSIS — Z8619 Personal history of other infectious and parasitic diseases: Secondary | ICD-10-CM | POA: Insufficient documentation

## 2013-04-16 DIAGNOSIS — N10 Acute tubulo-interstitial nephritis: Secondary | ICD-10-CM | POA: Diagnosis not present

## 2013-04-16 DIAGNOSIS — N12 Tubulo-interstitial nephritis, not specified as acute or chronic: Secondary | ICD-10-CM | POA: Diagnosis not present

## 2013-04-16 HISTORY — DX: Urinary tract infection, site not specified: N39.0

## 2013-04-16 LAB — I-STAT CHEM 8, ED
BUN: 7 mg/dL (ref 6–23)
CHLORIDE: 95 meq/L — AB (ref 96–112)
Calcium, Ion: 1.21 mmol/L (ref 1.13–1.30)
Creatinine, Ser: 0.6 mg/dL (ref 0.50–1.10)
Glucose, Bld: 122 mg/dL — ABNORMAL HIGH (ref 70–99)
HCT: 36 % (ref 36.0–46.0)
Hemoglobin: 12.2 g/dL (ref 12.0–15.0)
POTASSIUM: 3.7 meq/L (ref 3.7–5.3)
SODIUM: 134 meq/L — AB (ref 137–147)
TCO2: 26 mmol/L (ref 0–100)

## 2013-04-16 LAB — URINALYSIS, ROUTINE W REFLEX MICROSCOPIC
BILIRUBIN URINE: NEGATIVE
Glucose, UA: NEGATIVE mg/dL
Ketones, ur: NEGATIVE mg/dL
NITRITE: POSITIVE — AB
PH: 7 (ref 5.0–8.0)
Protein, ur: NEGATIVE mg/dL
Specific Gravity, Urine: 1.004 — ABNORMAL LOW (ref 1.005–1.030)
Urobilinogen, UA: 0.2 mg/dL (ref 0.0–1.0)

## 2013-04-16 LAB — URINE MICROSCOPIC-ADD ON

## 2013-04-16 MED ORDER — ONDANSETRON 8 MG PO TBDP: 8.0000 mg | ORAL_TABLET | Freq: Three times a day (TID) | ORAL | Status: DC | PRN

## 2013-04-16 MED ORDER — MORPHINE SULFATE 4 MG/ML IJ SOLN
4.0000 mg | Freq: Once | INTRAMUSCULAR | Status: AC
  Administered 2013-04-16: 4 mg via INTRAVENOUS
  Filled 2013-04-16: qty 1

## 2013-04-16 MED ORDER — CEPHALEXIN 500 MG PO CAPS: 500.0000 mg | ORAL_CAPSULE | Freq: Four times a day (QID) | ORAL | Status: DC

## 2013-04-16 MED ORDER — DEXTROSE 5 % IV SOLN
1.0000 g | Freq: Once | INTRAVENOUS | Status: AC
  Administered 2013-04-16: 1 g via INTRAVENOUS
  Filled 2013-04-16: qty 10

## 2013-04-16 MED ORDER — PHENAZOPYRIDINE HCL 200 MG PO TABS: 200.0000 mg | ORAL_TABLET | Freq: Three times a day (TID) | ORAL | Status: DC

## 2013-04-16 NOTE — ED Notes (Signed)
Pt arrived to the ED with a complaint of urinary frequency.  Pt has a hx of UTI.  Pt also has a renal failure hx.   Pt ambulatory with assistance.  Pt states symptoms have been present for three days. Pt alert and orientated.  Pt has paperwork indicating she is a DNR.

## 2013-04-16 NOTE — ED Provider Notes (Signed)
CSN: [id number]     Arrival date & time 04/16/13  0405 History   First MD Initiated Contact with Patient 04/16/13 [phone]     Chief Complaint  Patient presents with  . Urinary Frequency     (Consider location/radiation/quality/duration/timing/severity/associated sxs/prior Treatment) HPI Comments: 80 y/o comes in with cc of urinary urgency and frequency and left sided flank pain. Hx of UTI. Symptoms have been present for few days, but got worse on Thursday. She has some nausea, no chills, fevers. Pt has no abd pain.   Patient is a 80 y.o. female presenting with frequency. The history is provided by the patient.  Urinary Frequency Pertinent negatives include no chest pain, no abdominal pain, no headaches and no shortness of breath.    Past Medical History  Diagnosis Date  . Cancer   . Macular degeneration   . HOH (hard of hearing)   . Vaginitis and vulvovaginitis 02/29/2012  . Abnormalities of the hair 02/29/2012  . Spinal stenosis, lumbar region, with neurogenic claudication 11/02/2011  . Trigger finger (acquired) 11/02/2011  . Right bundle branch block 08/30/2011  . Edema 05/07/2011  . Contact dermatitis and other eczema, due to unspecified cause 05/07/2011  . Hypopotassemia 03/26/2011  . Candidiasis of skin and nails 03/19/2011  . Hyposmolality and/or hyponatremia 03/19/2011  . Acute posthemorrhagic anemia 03/19/2011  . Diverticulosis of colon (without mention of hemorrhage) 03/19/2011  . Unspecified constipation 03/19/2011  . Pain in joint, pelvic region and thigh 03/19/2011  . Lumbago 03/19/2011  . Spasm of muscle 03/19/2011  . Osteoporosis, unspecified 03/19/2011  . Insomnia, unspecified 03/19/2011  . Other malaise and fatigue 03/19/2011  . Impaired fasting glucose 03/19/2011  . Coronary atherosclerosis of native coronary artery 03/18/2011  . Hypotension, unspecified 03/18/2011  . Closed fracture of base of neck of femur 03/18/2011  . Malignant neoplasm of breast  (female), unspecified site 10/27/2004  . Arthritis   . Shortness of breath    Past Surgical History  Procedure Laterality Date  . Cataract extraction  2010    bialteral  . Hip pinning,cannulated  03/08/2011    Procedure: CANNULATED HIP PINNING;  Surgeon: Johnn Hai, MD;  Location: [facility];  Service: Orthopedics;  Laterality: Right;  . Eye surgery      cataract  . Fracture surgery Left 09/1980    ankle  . Breast surgery Bilateral 2005-10/27/2004    lumpectomy- Streck,MD  . Squamous cell carcinoma excision Right 02/04/2011    forearm- Taffeen, MD  . Orif hip fracture Right 03/08/2011    Bean,MD  . Skin lesion excision Right 02/04/2011    Abdomen lesion spongiotic dermatitis-Taffeen, MD  . Skin cancer excision Left 10/12/2012    lower leg Dr. [place]   Family History  Problem Relation Age of Onset  . Heart disease Mother   . Cancer Father   . Emphysema Brother   . Alzheimer's disease Brother    History  Substance Use Topics  . Smoking status: Never Smoker   . Smokeless tobacco: Never Used  . Alcohol Use: 0.0 oz/week    0 Glasses of wine per week     Comment: occasional glass   OB History   Grav Para Term Preterm Abortions TAB SAB Ect Mult Living                 Review of Systems  Constitutional: Negative for activity change.  Respiratory: Negative for shortness of breath.   Cardiovascular: Negative for chest pain.  Gastrointestinal: Negative for nausea,  vomiting and abdominal pain.  Genitourinary: Positive for urgency and frequency. Negative for dysuria and hematuria.  Musculoskeletal: Negative for neck pain.  Neurological: Negative for headaches.  All other systems reviewed and are negative.      Allergies  Macrodantin and Septra  Home Medications   Current Outpatient Rx  Name  Route  Sig  Dispense  Refill  . acetaminophen (TYLENOL) 500 MG tablet   Oral   Take 500 mg by mouth every 6 (six) hours as needed for pain.         Marland Kitchen  amoxicillin-clavulanate (AUGMENTIN) 875-125 MG per tablet   Oral   Take 1 tablet by mouth 2 (two) times daily. 7 day therapy course patient began on 04/12/2013         . aspirin EC 81 MG tablet   Oral   Take 81 mg by mouth daily.         . Bilberry 1000 MG CAPS   Oral   Take 1 capsule by mouth daily.         . Calcium Carbonate-Vitamin D (CALCIUM 600/VITAMIN D PO)   Oral   Take by mouth 2 (two) times daily.         . Calcium Polycarbophil (FIBERCON PO)   Oral   Take 2 tablets by mouth daily.         . cholecalciferol (VITAMIN D) 400 UNITS TABS   Oral   Take 400 Units by mouth daily. Take 1 tablet daily for vitamin supplement.         . citalopram (CELEXA) 20 MG tablet      Take 1 tablet daily to help with depression.   30 tablet   5   . fish oil-omega-3 fatty acids 1000 MG capsule   Oral   Take 1 g by mouth daily. Take 1 capsule daily for vitamin supplement.         . Glucosamine-Vitamin D (GLUCOSAMINE PLUS VITAMIN D) 1500-200 MG-UNIT TABS   Oral   Take 1 tablet by mouth 2 (two) times daily. Take 1 tablet twice daily for joint health         . HYDROcodone-acetaminophen (NORCO/VICODIN) 5-325 MG per tablet   Oral   Take 1-2 tablets by mouth every 4 (four) hours as needed for moderate pain.   120 tablet   0   . Lutein 6 MG TABS   Oral   Take 1 tablet by mouth daily.         . Multiple Vitamins-Minerals (PRESERVISION AREDS 2) CAPS   Oral   Take 2 capsules by mouth daily.         . Multiple Vitamins-Minerals (STRESS FORMULA 500/ZINC) TABS   Oral   Take 1 tablet by mouth daily.         . Phenazopyridine HCl 97.5 MG TABS   Oral   Take 195 mg by mouth 3 (three) times daily as needed (for urinary pain).         . polyethylene glycol (MIRALAX / GLYCOLAX) packet   Oral   Take 17 g by mouth at bedtime.         . Probiotic Product (RESTORA) CAPS   Oral   Take 1 capsule by mouth daily.         Marland Kitchen zolpidem (AMBIEN) 5 MG tablet   Oral    Take 2.5 mg by mouth at bedtime as needed for sleep.          BP 143/55  Pulse 78  Temp(Src) 98.6 F (37 C) (Oral)  SpO2 97% Physical Exam  Nursing note and vitals reviewed. Constitutional: She is oriented to person, place, and time. She appears well-developed and well-nourished.  HENT:  Head: Normocephalic and atraumatic.  Eyes: EOM are normal. Pupils are equal, round, and reactive to light.  Neck: Neck supple.  Cardiovascular: Normal rate, regular rhythm and normal heart sounds.   No murmur heard. Pulmonary/Chest: Effort normal. No respiratory distress.  Abdominal: Soft. She exhibits no distension. There is no tenderness. There is no rebound and no guarding.  Genitourinary:  Left CVA tenderness  Neurological: She is alert and oriented to person, place, and time.  Skin: Skin is warm and dry.    ED Course  Procedures (including critical care time) Labs Review Labs Reviewed  URINALYSIS, ROUTINE W REFLEX MICROSCOPIC - Abnormal; Notable for the following:    APPearance CLOUDY (*)    Specific Gravity, Urine 1.004 (*)    Hgb urine dipstick TRACE (*)    Nitrite POSITIVE (*)    Leukocytes, UA LARGE (*)    All other components within normal limits  URINE MICROSCOPIC-ADD ON - Abnormal; Notable for the following:    Bacteria, UA MANY (*)    All other components within normal limits  I-STAT CHEM 8, ED - Abnormal; Notable for the following:    Sodium 134 (*)    Chloride 95 (*)    Glucose, Bld 122 (*)    All other components within normal limits  URINE CULTURE   Imaging Review No results found.   EKG Interpretation None      MDM   Final diagnoses:  None    Pt comes in with cc of left flank pain, urinary urgency. UA is showing infection. Clinically has pyelonephritis with her sx. Chem 8 is normal. She will receive a dose of iv ceftriaxone and oral challenge in the ER, is she passes, she will be discharged.    Varney Biles, MD 04/16/13 [phone]

## 2013-04-16 NOTE — Discharge Instructions (Signed)
Please come to the ER if you start having fevers, chills, confusion. See your doctor for further evaluation.   Pyelonephritis, Adult Pyelonephritis is a kidney infection. In general, there are 2 main types of pyelonephritis:  Infections that come on quickly without any warning (acute pyelonephritis).  Infections that persist for a long period of time (chronic pyelonephritis). CAUSES  Two main causes of pyelonephritis are:  Bacteria traveling from the bladder to the kidney. This is a problem especially in pregnant women. The urine in the bladder can become filled with bacteria from multiple causes, including:  Inflammation of the prostate gland (prostatitis).  Sexual intercourse in females.  Bladder infection (cystitis).  Bacteria traveling from the bloodstream to the tissue part of the kidney. Problems that may increase your risk of getting a kidney infection include:  Diabetes.  Kidney stones or bladder stones.  Cancer.  Catheters placed in the bladder.  Other abnormalities of the kidney or ureter. SYMPTOMS   Abdominal pain.  Pain in the side or flank area.  Fever.  Chills.  Upset stomach.  Blood in the urine (dark urine).  Frequent urination.  Strong or persistent urge to urinate.  Burning or stinging when urinating. DIAGNOSIS  Your caregiver may diagnose your kidney infection based on your symptoms. A urine sample may also be taken. TREATMENT  In general, treatment depends on how severe the infection is.   If the infection is mild and caught early, your caregiver may treat you with oral antibiotics and send you home.  If the infection is more severe, the bacteria may have gotten into the bloodstream. This will require intravenous (IV) antibiotics and a hospital stay. Symptoms may include:  High fever.  Severe flank pain.  Shaking chills.  Even after a hospital stay, your caregiver may require you to be on oral antibiotics for a period of  time.  Other treatments may be required depending upon the cause of the infection. HOME CARE INSTRUCTIONS   Take your antibiotics as directed. Finish them even if you start to feel better.  Make an appointment to have your urine checked to make sure the infection is gone.  Drink enough fluids to keep your urine clear or pale yellow.  Take medicines for the bladder if you have urgency and frequency of urination as directed by your caregiver. SEEK IMMEDIATE MEDICAL CARE IF:   You have a fever or persistent symptoms for more than 2-3 days.  You have a fever and your symptoms suddenly get worse.  You are unable to take your antibiotics or fluids.  You develop shaking chills.  You experience extreme weakness or fainting.  There is no improvement after 2 days of treatment. MAKE SURE YOU:  Understand these instructions.  Will watch your condition.  Will get help right away if you are not doing well or get worse. Document Released: 12/28/2004 Document Revised: 06/29/2011 Document Reviewed: 06/03/2010 [facility] Patient Information 2014 [place], [place].

## 2013-04-16 NOTE — ED Notes (Signed)
Bed: KL49 Expected date:  Expected time:  Means of arrival:  Comments: EMS 80yo F, lower abd / kidney pain, ? UTI

## 2013-04-17 DIAGNOSIS — Z9181 History of falling: Secondary | ICD-10-CM | POA: Insufficient documentation

## 2013-04-18 DIAGNOSIS — M545 Low back pain, unspecified: Secondary | ICD-10-CM | POA: Diagnosis not present

## 2013-04-18 DIAGNOSIS — M48061 Spinal stenosis, lumbar region without neurogenic claudication: Secondary | ICD-10-CM | POA: Diagnosis not present

## 2013-04-18 DIAGNOSIS — M5137 Other intervertebral disc degeneration, lumbosacral region: Secondary | ICD-10-CM | POA: Diagnosis not present

## 2013-04-18 DIAGNOSIS — M4120 Other idiopathic scoliosis, site unspecified: Secondary | ICD-10-CM | POA: Diagnosis not present

## 2013-04-18 LAB — URINE CULTURE: Special Requests: NORMAL

## 2013-04-19 DIAGNOSIS — D649 Anemia, unspecified: Secondary | ICD-10-CM | POA: Diagnosis not present

## 2013-04-19 DIAGNOSIS — E871 Hypo-osmolality and hyponatremia: Secondary | ICD-10-CM | POA: Diagnosis not present

## 2013-04-19 DIAGNOSIS — R739 Hyperglycemia, unspecified: Secondary | ICD-10-CM | POA: Insufficient documentation

## 2013-04-19 LAB — CBC AND DIFFERENTIAL
HCT: 34 % — AB (ref 36–46)
HEMOGLOBIN: 11.6 g/dL — AB (ref 12.0–16.0)
PLATELETS: 288 10*3/uL (ref 150–399)
WBC: 7.4 10*3/mL

## 2013-04-19 LAB — BASIC METABOLIC PANEL
BUN: 11 mg/dL (ref 4–21)
Creatinine: 0.6 mg/dL (ref 0.5–1.1)
GLUCOSE: 112 mg/dL
Potassium: 4.5 mmol/L (ref 3.4–5.3)
Sodium: 131 mmol/L — AB (ref 137–147)

## 2013-04-19 LAB — HEPATIC FUNCTION PANEL
ALT: 19 U/L (ref 7–35)
AST: 25 U/L (ref 13–35)
Alkaline Phosphatase: 74 U/L (ref 25–125)
BILIRUBIN, TOTAL: 0.5 mg/dL

## 2013-04-19 NOTE — Progress Notes (Signed)
ED Antimicrobial Stewardship Positive Culture Follow Up   Cassandra Glenn is an 80 y.o. female who presented to [facility] on 04/16/2013 with a chief complaint of  Chief Complaint  Patient presents with  . Urinary Frequency    Recent Results (from the past 720 hour(s))  URINE CULTURE     Status: None   Collection Time    04/16/13  4:57 AM      Result Value Ref Range Status   Specimen Description URINE, RANDOM   Final   Special Requests Normal   Final   Culture  Setup Time     Final   Value: 04/16/2013 09:16     Performed at [facility]     Final   Value: >=100,000 COLONIES/ML     Performed at [facility]   Culture     Final   Value: PSEUDOMONAS AERUGINOSA     Performed at [facility]   Report Status 04/18/2013 FINAL   Final   Organism ID, Bacteria PSEUDOMONAS AERUGINOSA   Final    [x]  Treated with augmentin/keflex, organism resistant to prescribed antimicrobial Pt came in with urinary frequency. She was dx with pyelo. Culture came back with pseudomonas. It's sens to cipro.   New antibiotic prescription:  Dc Keflex and augmentin Start Cipro 500mg  PO BID x7 days  ED Provider: Noland Fordyce, PA   Onnie Boer, PharmD Pager: [phone] Infectious Diseases Pharmacist Phone# [phone]

## 2013-04-20 NOTE — ED Notes (Signed)
Post ED Visit - Positive Culture Follow-up  Culture report reviewed by antimicrobial stewardship pharmacist: []  Wes [place], Pharm.D., BCPS [x]  Heide Guile, Pharm.D., BCPS []  Alycia Rossetti, Pharm.D., BCPS []  Redbird Smith, [place].D., BCPS, AAHIVP []  Legrand Como, Pharm.D., BCPS, AAHIVP []  Juliene Pina, Pharm.D.  Positive Urine culture Rx for Cipro 500 mg po BID x 7 days written by Noland Fordyce PA-C   [place] Liya Strollo 04/20/2013, 10:15 PM

## 2013-04-21 ENCOUNTER — Telehealth (HOSPITAL_BASED_OUTPATIENT_CLINIC_OR_DEPARTMENT_OTHER): Payer: Self-pay | Admitting: Emergency Medicine

## 2013-04-23 NOTE — ED Notes (Signed)
Left voice mail message for daughter to return call.Patient notified of results and requests that current scriber talk to Daughter(Emily Black)-to get pharmacy information.

## 2013-04-24 ENCOUNTER — Encounter: Payer: Self-pay | Admitting: Internal Medicine

## 2013-04-24 ENCOUNTER — Non-Acute Institutional Stay: Payer: Medicare Other | Admitting: Internal Medicine

## 2013-04-24 ENCOUNTER — Other Ambulatory Visit: Payer: Self-pay

## 2013-04-24 VITALS — BP 106/60 | HR 64 | Temp 97.6°F | Wt 123.0 lb

## 2013-04-24 DIAGNOSIS — N390 Urinary tract infection, site not specified: Secondary | ICD-10-CM | POA: Diagnosis not present

## 2013-04-24 DIAGNOSIS — R350 Frequency of micturition: Secondary | ICD-10-CM

## 2013-04-24 DIAGNOSIS — Z9181 History of falling: Secondary | ICD-10-CM

## 2013-04-24 DIAGNOSIS — M79609 Pain in unspecified limb: Secondary | ICD-10-CM | POA: Diagnosis not present

## 2013-04-24 DIAGNOSIS — D638 Anemia in other chronic diseases classified elsewhere: Secondary | ICD-10-CM

## 2013-04-24 DIAGNOSIS — R739 Hyperglycemia, unspecified: Secondary | ICD-10-CM

## 2013-04-24 DIAGNOSIS — R9389 Abnormal findings on diagnostic imaging of other specified body structures: Secondary | ICD-10-CM

## 2013-04-24 DIAGNOSIS — R7309 Other abnormal glucose: Secondary | ICD-10-CM

## 2013-04-24 DIAGNOSIS — E871 Hypo-osmolality and hyponatremia: Secondary | ICD-10-CM

## 2013-04-24 DIAGNOSIS — I2510 Atherosclerotic heart disease of native coronary artery without angina pectoris: Secondary | ICD-10-CM | POA: Diagnosis not present

## 2013-04-24 DIAGNOSIS — G4700 Insomnia, unspecified: Secondary | ICD-10-CM | POA: Diagnosis not present

## 2013-04-24 DIAGNOSIS — R079 Chest pain, unspecified: Secondary | ICD-10-CM | POA: Diagnosis not present

## 2013-04-24 DIAGNOSIS — I5032 Chronic diastolic (congestive) heart failure: Secondary | ICD-10-CM | POA: Diagnosis not present

## 2013-04-24 HISTORY — DX: Frequency of micturition: R35.0

## 2013-04-24 MED ORDER — HYDROCODONE-ACETAMINOPHEN 5-325 MG PO TABS: 1.0000 | ORAL_TABLET | ORAL | Status: DC | PRN

## 2013-04-24 NOTE — ED Notes (Signed)
rx called to [facility] by [place] [facility]

## 2013-04-24 NOTE — Progress Notes (Signed)
Patient ID: Cassandra Glenn, female   DOB: Apr 05, 1920, 80 y.o.   MRN: [id number]    Location:  [facility]  Place of Service: CLINIC    Allergies  Allergen Reactions  . Macrodantin [Nitrofurantoin Macrocrystal]     unknown  . Septra [Sulfamethoxazole-Trimethoprim]     unknown    Chief Complaint  Patient presents with  . Medical Managment of Chronic Issues    insomnia, spinal stenosis  . Urinary Tract Infection    04/16/13 went to [facility] ER started on medication  . Fall    04/17/13 trip over walker, saw Dr. Maxie Better, back was hurting, to get MRI 04/27/13 of back    HPI:  UTI (urinary tract infection): seen in ER 04/16/13. Found UTI and was started on Keflex. Organism was pseudomonas aeruginosa resistant to keflex. Started Cipro yesterday. Still with frequency, but the previous pain in the left flank and the dysuria is better.  Urinary frequency; related to UTI  History of fall: hurt the back a little, but she has chronic back pain and it is difficult for her to say the fall made things much worse.  Anemia, chronic disease: hgb 11.6 on 04/19/13  Hyponatremia: Na+ 131  Hyperglycemia; glucose 112  Abnormal CT scan, chest: due to scar tissue from prior irradiation related to breast cancer.  Insomnia: improved    Medications: Patient's Medications  New Prescriptions   No medications on file  Previous Medications   ACETAMINOPHEN (TYLENOL) 500 MG TABLET    Take 500 mg by mouth every 6 (six) hours as needed for pain.   ASPIRIN EC 81 MG TABLET    Take 81 mg by mouth daily.   BILBERRY 1000 MG CAPS    Take 1 capsule by mouth daily.   CALCIUM CARBONATE-VITAMIN D (CALCIUM 600/VITAMIN D PO)    Take by mouth 2 (two) times daily.   CALCIUM POLYCARBOPHIL (FIBERCON PO)    Take 2 tablets by mouth daily.   CHOLECALCIFEROL (VITAMIN D) 400 UNITS TABS    Take 400 Units by mouth daily. Take 1 tablet daily for vitamin supplement.   CITALOPRAM (CELEXA) 20 MG TABLET    Take 1 tablet daily to help with  depression.   FISH OIL-OMEGA-3 FATTY ACIDS 1000 MG CAPSULE    Take 1 g by mouth daily. Take 1 capsule daily for vitamin supplement.   GLUCOSAMINE-VITAMIN D (GLUCOSAMINE PLUS VITAMIN D) 1500-200 MG-UNIT TABS    Take 1 tablet by mouth 2 (two) times daily. Take 1 tablet twice daily for joint health   HYDROCODONE-ACETAMINOPHEN (NORCO/VICODIN) 5-325 MG PER TABLET    Take 1-2 tablets by mouth every 4 (four) hours as needed for moderate pain.   LUTEIN 6 MG TABS    Take 1 tablet by mouth daily.   MULTIPLE VITAMINS-MINERALS (PRESERVISION AREDS 2) CAPS    Take 2 capsules by mouth daily.   MULTIPLE VITAMINS-MINERALS (STRESS FORMULA 500/ZINC) TABS    Take 1 tablet by mouth daily.   ONDANSETRON (ZOFRAN ODT) 8 MG DISINTEGRATING TABLET    Take 1 tablet (8 mg total) by mouth every 8 (eight) hours as needed for nausea.   PHENAZOPYRIDINE (PYRIDIUM) 200 MG TABLET    Take 1 tablet (200 mg total) by mouth 3 (three) times daily.   PHENAZOPYRIDINE HCL 97.5 MG TABS    Take 195 mg by mouth 3 (three) times daily as needed (for urinary pain).   POLYETHYLENE GLYCOL (MIRALAX / GLYCOLAX) PACKET    Take 17 g by mouth at bedtime.  PROBIOTIC PRODUCT (RESTORA) CAPS    Take 1 capsule by mouth daily.   ZOLPIDEM (AMBIEN) 5 MG TABLET    Take 2.5 mg by mouth at bedtime as needed for sleep.  Modified Medications   Modified Medication Previous Medication   CEPHALEXIN (KEFLEX) 500 MG CAPSULE cephALEXin (KEFLEX) 500 MG capsule      Take 500 mg by mouth. Take one tablet twice daily for 7 days. Started 04/23/13    Take 1 capsule (500 mg total) by mouth 4 (four) times daily.  Discontinued Medications   AMOXICILLIN-CLAVULANATE (AUGMENTIN) 875-125 MG PER TABLET    Take 1 tablet by mouth 2 (two) times daily. 7 day therapy course patient began on 04/12/2013     Review of Systems  Constitutional: Negative for fever, activity change, fatigue and unexpected weight change.  HENT: Positive for hearing loss. Negative for ear pain.   Eyes:        History of loss of visual acuity and macular degeneration.  Respiratory:       History of breast discomfort. History of removal of a lump of cancer from the right breast.  Cardiovascular: Negative for chest pain, palpitations and leg swelling.  Gastrointestinal:       Chronic sluggish stools and straining. She frequently complains that her intestines are not working right. There is a previous history of diverticulosis. Her last colonoscopy was in 2008. She has been seen by Dr. Paulita Fujita in the past. Complains of bloated feeling.  Genitourinary:       Complains of a slow, weak urinary stream. Denies dysuria, frequency, or incontinence. Vaginal discomfort. Managed by Dr. Claudean Kinds.  Musculoskeletal:       Chronic back pain. Pain down the right leg. Some restriction of joint motion of the right hip.. She feels that there is something loose in the right hip area. There is generalized stiffness in the joints. There is a history of spinal stenosis. Left third finger catches on extension sometimes. There is a trigger finger. Pain at the left iliac crest and left anterior ribs near the sternum.  Skin:       History of skin discoloration and changes of nails. Previous history of folliculitis of the left lower leg. Folliculitis has cleared. Denies itching or rash.  Allergic/Immunologic: Negative.   Neurological: Negative for dizziness, tremors, seizures and numbness.       History of spinal stenosis with neurogenic claudication affecting the right leg. Complains of pain in the buttocks that is suggestive of neuralgia from her spinal stenosis.  Hematological: Bruises/bleeds easily.  Psychiatric/Behavioral: Negative.     Filed Vitals:   04/24/13 0948  BP: 106/60  Pulse: 64  Temp: 97.6 F (36.4 C)  TempSrc: Oral  Weight: 123 lb (55.792 kg)   Physical Exam  Constitutional: She is oriented to person, place, and time. She appears well-developed and well-nourished. No distress.  HENT:  Head:  Normocephalic and atraumatic.  Right Ear: External ear normal.  Left Ear: External ear normal.  Nose: Nose normal.  Mouth/Throat: Oropharynx is clear and moist. No oropharyngeal exudate.  Eyes: Conjunctivae and EOM are normal. Pupils are equal, round, and reactive to light. Right eye exhibits no discharge. Left eye exhibits no discharge. No scleral icterus.  Neck: Normal range of motion. Neck supple. No JVD present. No tracheal deviation present. No thyromegaly present.  Cardiovascular: Normal rate, regular rhythm, normal heart sounds and intact distal pulses.   No murmur heard. Pulmonary/Chest: Effort normal and breath sounds normal. No stridor. No respiratory  distress. She has no wheezes. She has no rales. She exhibits no tenderness.  Abdominal: Soft. Bowel sounds are normal. She exhibits no distension. There is no tenderness. There is no rebound and no guarding.  Musculoskeletal: Normal range of motion. She exhibits tenderness. She exhibits no edema.  Anterior left lower rib cage pain near the sternum. Tender in the left SI joint area and along the left iliac crest.  Lymphadenopathy:    She has no cervical adenopathy.  Neurological: She is alert and oriented to person, place, and time. She has normal reflexes. No cranial nerve deficit. She exhibits normal muscle tone. Coordination normal.  Skin: Skin is warm and dry. No rash noted. She is not diaphoretic. No erythema.  Scaly area on the right shoulder blade.    Psychiatric: She has a normal mood and affect. Her behavior is normal. Judgment and thought content normal.     Labs reviewed: Nursing Home on 04/24/2013  Component Date Value Ref Range Status  . Hemoglobin 04/19/2013 11.6* 12.0 - 16.0 g/dL Final  . HCT 04/19/2013 34* 36 - 46 % Final  . Platelets 04/19/2013 288  150 - 399 K/L Final  . WBC 04/19/2013 7.4   Final  . Glucose 04/19/2013 112   Final  . BUN 04/19/2013 11  4 - 21 mg/dL Final  . Creatinine 04/19/2013 0.6  0.5 - 1.1  mg/dL Final  . Potassium 04/19/2013 4.5  3.4 - 5.3 mmol/L Final  . Sodium 04/19/2013 131* 137 - 147 mmol/L Final  . Alkaline Phosphatase 04/19/2013 74  25 - 125 U/L Final  . ALT 04/19/2013 19  7 - 35 U/L Final  . AST 04/19/2013 25  13 - 35 U/L Final  . Bilirubin, Total 04/19/2013 0.5   Final  Admission on 04/16/2013, Discharged on 04/16/2013  Component Date Value Ref Range Status  . Color, Urine 04/16/2013 YELLOW  YELLOW Final  . APPearance 04/16/2013 CLOUDY* CLEAR Final  . Specific Gravity, Urine 04/16/2013 1.004* 1.005 - 1.030 Final  . pH 04/16/2013 7.0  5.0 - 8.0 Final  . Glucose, UA 04/16/2013 NEGATIVE  NEGATIVE mg/dL Final  . Hgb urine dipstick 04/16/2013 TRACE* NEGATIVE Final  . Bilirubin Urine 04/16/2013 NEGATIVE  NEGATIVE Final  . Ketones, ur 04/16/2013 NEGATIVE  NEGATIVE mg/dL Final  . Protein, ur 04/16/2013 NEGATIVE  NEGATIVE mg/dL Final  . Urobilinogen, UA 04/16/2013 0.2  0.0 - 1.0 mg/dL Final  . Nitrite 04/16/2013 POSITIVE* NEGATIVE Final  . Leukocytes, UA 04/16/2013 LARGE* NEGATIVE Final  . Specimen Description 04/16/2013 URINE, RANDOM   Final  . Special Requests 04/16/2013 Normal   Final  . Culture  Setup Time 04/16/2013    Final                   Value:04/16/2013 09:16                         Performed at [facility]  . Colony Count 04/16/2013    Final                   Value:>=100,000 COLONIES/ML                         Performed at [facility]  . Culture 04/16/2013    Final                   Value:PSEUDOMONAS AERUGINOSA  Performed at [facility]  . Report Status 04/16/2013 04/18/2013 FINAL   Final  . Organism ID, Bacteria 04/16/2013 PSEUDOMONAS AERUGINOSA   Final  . WBC, UA 04/16/2013 TOO NUMEROUS TO COUNT  <3 WBC/hpf Final  . RBC / HPF 04/16/2013 0-2  <3 RBC/hpf Final  . Bacteria, UA 04/16/2013 MANY* RARE Final  . Sodium 04/16/2013 134* 137 - 147 mEq/L Final  . Potassium 04/16/2013 3.7  3.7 - 5.3 mEq/L Final  .  Chloride 04/16/2013 95* 96 - 112 mEq/L Final  . BUN 04/16/2013 7  6 - 23 mg/dL Final  . Creatinine, Ser 04/16/2013 0.60  0.50 - 1.10 mg/dL Final  . Glucose, Bld 04/16/2013 122* 70 - 99 mg/dL Final  . Calcium, Ion 04/16/2013 1.21  1.13 - 1.30 mmol/L Final  . TCO2 04/16/2013 26  0 - 100 mmol/L Final  . Hemoglobin 04/16/2013 12.2  12.0 - 15.0 g/dL Final  . HCT 04/16/2013 36.0  36.0 - 46.0 % Final  Admission on 02/25/2013, Discharged on 02/26/2013  Component Date Value Ref Range Status  . WBC 02/25/2013 10.2  4.0 - 10.5 K/uL Final  . RBC 02/25/2013 3.82* 3.87 - 5.11 MIL/uL Final  . Hemoglobin 02/25/2013 11.5* 12.0 - 15.0 g/dL Final  . HCT 02/25/2013 34.2* 36.0 - 46.0 % Final  . MCV 02/25/2013 89.5  78.0 - 100.0 fL Final  . MCH 02/25/2013 30.1  26.0 - 34.0 pg Final  . MCHC 02/25/2013 33.6  30.0 - 36.0 g/dL Final  . RDW 02/25/2013 14.4  11.5 - 15.5 % Final  . Platelets 02/25/2013 213  150 - 400 K/uL Final  . Neutrophils Relative % 02/25/2013 82* 43 - 77 % Final  . Neutro Abs 02/25/2013 8.3* 1.7 - 7.7 K/uL Final  . Lymphocytes Relative 02/25/2013 8* 12 - 46 % Final  . Lymphs Abs 02/25/2013 0.8  0.7 - 4.0 K/uL Final  . Monocytes Relative 02/25/2013 8  3 - 12 % Final  . Monocytes Absolute 02/25/2013 0.8  0.1 - 1.0 K/uL Final  . Eosinophils Relative 02/25/2013 3  0 - 5 % Final  . Eosinophils Absolute 02/25/2013 0.3  0.0 - 0.7 K/uL Final  . Basophils Relative 02/25/2013 0  0 - 1 % Final  . Basophils Absolute 02/25/2013 0.0  0.0 - 0.1 K/uL Final  . Sodium 02/25/2013 134* 137 - 147 mEq/L Final  . Potassium 02/25/2013 4.1  3.7 - 5.3 mEq/L Final  . Chloride 02/25/2013 98  96 - 112 mEq/L Final  . CO2 02/25/2013 23  19 - 32 mEq/L Final  . Glucose, Bld 02/25/2013 161* 70 - 99 mg/dL Final  . BUN 02/25/2013 17  6 - 23 mg/dL Final  . Creatinine, Ser 02/25/2013 0.60  0.50 - 1.10 mg/dL Final  . Calcium 02/25/2013 8.8  8.4 - 10.5 mg/dL Final  . Total Protein 02/25/2013 6.7  6.0 - 8.3 g/dL Final  .  Albumin 02/25/2013 3.6  3.5 - 5.2 g/dL Final  . AST 02/25/2013 29  0 - 37 U/L Final  . ALT 02/25/2013 20  0 - 35 U/L Final  . Alkaline Phosphatase 02/25/2013 85  39 - 117 U/L Final  . Total Bilirubin 02/25/2013 0.4  0.3 - 1.2 mg/dL Final  . GFR calc non Af Amer 02/25/2013 78* >90 mL/min Final  . GFR calc Af Amer 02/25/2013 >90  >90 mL/min Final   Comment: (NOTE)  The eGFR has been calculated using the CKD EPI equation.                          This calculation has not been validated in all clinical situations.                          eGFR's persistently <90 mL/min signify possible Chronic Kidney                          Disease.  . Lipase 02/25/2013 22  11 - 59 U/L Final  . Color, Urine 02/25/2013 YELLOW  YELLOW Final  . APPearance 02/25/2013 CLEAR  CLEAR Final  . Specific Gravity, Urine 02/25/2013 1.016  1.005 - 1.030 Final  . pH 02/25/2013 7.0  5.0 - 8.0 Final  . Glucose, UA 02/25/2013 NEGATIVE  NEGATIVE mg/dL Final  . Hgb urine dipstick 02/25/2013 NEGATIVE  NEGATIVE Final  . Bilirubin Urine 02/25/2013 NEGATIVE  NEGATIVE Final  . Ketones, ur 02/25/2013 NEGATIVE  NEGATIVE mg/dL Final  . Protein, ur 02/25/2013 NEGATIVE  NEGATIVE mg/dL Final  . Urobilinogen, UA 02/25/2013 0.2  0.0 - 1.0 mg/dL Final  . Nitrite 02/25/2013 NEGATIVE  NEGATIVE Final  . Leukocytes, UA 02/25/2013 TRACE* NEGATIVE Final  . ABO/RH(D) 02/25/2013 A POS   Final  . Antibody Screen 02/25/2013 NEG   Final  . Sample Expiration 02/25/2013 02/28/2013   Final  . aPTT 02/25/2013 27  24 - 37 seconds Final  . Prothrombin Time 02/25/2013 12.5  11.6 - 15.2 seconds Final  . INR 02/25/2013 0.95  0.00 - 1.49 Final  . Lactic Acid, Venous 02/25/2013 0.91  0.5 - 2.2 mmol/L Final  . ABO/RH(D) 02/25/2013 A POS   Final  . Fecal Occult Bld 02/25/2013 NEGATIVE  NEGATIVE Final  . WBC 02/25/2013 12.1* 4.0 - 10.5 K/uL Final  . RBC 02/25/2013 4.08  3.87 - 5.11 MIL/uL Final  . Hemoglobin 02/25/2013 12.0  12.0 -  15.0 g/dL Final  . HCT 02/25/2013 36.8  36.0 - 46.0 % Final  . MCV 02/25/2013 90.2  78.0 - 100.0 fL Final  . MCH 02/25/2013 29.4  26.0 - 34.0 pg Final  . MCHC 02/25/2013 32.6  30.0 - 36.0 g/dL Final  . RDW 02/25/2013 14.4  11.5 - 15.5 % Final  . Platelets 02/25/2013 232  150 - 400 K/uL Final  . WBC, UA 02/25/2013 3-6  <3 WBC/hpf Final  . Sodium 02/25/2013 135* 137 - 147 mEq/L Final  . Potassium 02/25/2013 4.3  3.7 - 5.3 mEq/L Final  . Chloride 02/25/2013 97  96 - 112 mEq/L Final  . CO2 02/25/2013 24  19 - 32 mEq/L Final  . Glucose, Bld 02/25/2013 128* 70 - 99 mg/dL Final  . BUN 02/25/2013 15  6 - 23 mg/dL Final  . Creatinine, Ser 02/25/2013 0.59  0.50 - 1.10 mg/dL Final  . Calcium 02/25/2013 8.7  8.4 - 10.5 mg/dL Final  . Total Protein 02/25/2013 6.8  6.0 - 8.3 g/dL Final  . Albumin 02/25/2013 3.5  3.5 - 5.2 g/dL Final  . AST 02/25/2013 28  0 - 37 U/L Final  . ALT 02/25/2013 21  0 - 35 U/L Final  . Alkaline Phosphatase 02/25/2013 84  39 - 117 U/L Final  . Total Bilirubin 02/25/2013 0.5  0.3 - 1.2 mg/dL Final  . GFR calc non Af Amer 02/25/2013 78* >90 mL/min  Final  . GFR calc Af Amer 02/25/2013 >90  >90 mL/min Final   Comment: (NOTE)                          The eGFR has been calculated using the CKD EPI equation.                          This calculation has not been validated in all clinical situations.                          eGFR's persistently <90 mL/min signify possible Chronic Kidney                          Disease.  . Magnesium 02/25/2013 2.1  1.5 - 2.5 mg/dL Final  . Phosphorus 02/25/2013 2.6  2.3 - 4.6 mg/dL Final  . WBC 02/25/2013 8.4  4.0 - 10.5 K/uL Final  . RBC 02/25/2013 4.06  3.87 - 5.11 MIL/uL Final  . Hemoglobin 02/25/2013 12.0  12.0 - 15.0 g/dL Final  . HCT 02/25/2013 36.3  36.0 - 46.0 % Final  . MCV 02/25/2013 89.4  78.0 - 100.0 fL Final  . MCH 02/25/2013 29.6  26.0 - 34.0 pg Final  . MCHC 02/25/2013 33.1  30.0 - 36.0 g/dL Final  . RDW 02/25/2013 14.3  11.5  - 15.5 % Final  . Platelets 02/25/2013 211  150 - 400 K/uL Final  . Neutrophils Relative % 02/25/2013 85* 43 - 77 % Final  . Neutro Abs 02/25/2013 7.2  1.7 - 7.7 K/uL Final  . Lymphocytes Relative 02/25/2013 10* 12 - 46 % Final  . Lymphs Abs 02/25/2013 0.8  0.7 - 4.0 K/uL Final  . Monocytes Relative 02/25/2013 4  3 - 12 % Final  . Monocytes Absolute 02/25/2013 0.4  0.1 - 1.0 K/uL Final  . Eosinophils Relative 02/25/2013 1  0 - 5 % Final  . Eosinophils Absolute 02/25/2013 0.1  0.0 - 0.7 K/uL Final  . Basophils Relative 02/25/2013 0  0 - 1 % Final  . Basophils Absolute 02/25/2013 0.0  0.0 - 0.1 K/uL Final  . aPTT 02/25/2013 28  24 - 37 seconds Final  . Prothrombin Time 02/25/2013 12.9  11.6 - 15.2 seconds Final  . INR 02/25/2013 0.99  0.00 - 1.49 Final  . Specimen Description 02/25/2013 URINE, CLEAN CATCH   Final  . Special Requests 02/25/2013 NONE   Final  . Culture  Setup Time 02/25/2013    Final                   Value:02/25/2013 20:35                         Performed at [facility]  . Colony Count 02/25/2013    Final                   Value:NO GROWTH                         Performed at [facility]  . Culture 02/25/2013    Final                   Value:NO GROWTH  Performed at [facility]  . Report Status 02/25/2013 02/26/2013 FINAL   Final  . Color, Urine 02/25/2013 YELLOW  YELLOW Final  . APPearance 02/25/2013 CLEAR  CLEAR Final  . Specific Gravity, Urine 02/25/2013 1.019  1.005 - 1.030 Final  . pH 02/25/2013 6.5  5.0 - 8.0 Final  . Glucose, UA 02/25/2013 NEGATIVE  NEGATIVE mg/dL Final  . Hgb urine dipstick 02/25/2013 MODERATE* NEGATIVE Final  . Bilirubin Urine 02/25/2013 NEGATIVE  NEGATIVE Final  . Ketones, ur 02/25/2013 15* NEGATIVE mg/dL Final  . Protein, ur 02/25/2013 NEGATIVE  NEGATIVE mg/dL Final  . Urobilinogen, UA 02/25/2013 0.2  0.0 - 1.0 mg/dL Final  . Nitrite 02/25/2013 NEGATIVE  NEGATIVE Final  . Leukocytes, UA  02/25/2013 NEGATIVE  NEGATIVE Final  . Squamous Epithelial / LPF 02/25/2013 FEW* RARE Final  . WBC, UA 02/25/2013 3-6  <3 WBC/hpf Final  . RBC / HPF 02/25/2013 11-20  <3 RBC/hpf Final  . Sodium 02/26/2013 133* 137 - 147 mEq/L Final  . Potassium 02/26/2013 3.7  3.7 - 5.3 mEq/L Final  . Chloride 02/26/2013 99  96 - 112 mEq/L Final  . CO2 02/26/2013 26  19 - 32 mEq/L Final  . Glucose, Bld 02/26/2013 95  70 - 99 mg/dL Final  . BUN 02/26/2013 11  6 - 23 mg/dL Final  . Creatinine, Ser 02/26/2013 0.61  0.50 - 1.10 mg/dL Final  . Calcium 02/26/2013 8.4  8.4 - 10.5 mg/dL Final  . Total Protein 02/26/2013 5.8* 6.0 - 8.3 g/dL Final  . Albumin 02/26/2013 2.9* 3.5 - 5.2 g/dL Final  . AST 02/26/2013 20  0 - 37 U/L Final  . ALT 02/26/2013 16  0 - 35 U/L Final  . Alkaline Phosphatase 02/26/2013 66  39 - 117 U/L Final  . Total Bilirubin 02/26/2013 0.3  0.3 - 1.2 mg/dL Final  . GFR calc non Af Amer 02/26/2013 77* >90 mL/min Final  . GFR calc Af Amer 02/26/2013 90* >90 mL/min Final   Comment: (NOTE)                          The eGFR has been calculated using the CKD EPI equation.                          This calculation has not been validated in all clinical situations.                          eGFR's persistently <90 mL/min signify possible Chronic Kidney                          Disease.  . WBC 02/26/2013 5.0  4.0 - 10.5 K/uL Final  . RBC 02/26/2013 3.55* 3.87 - 5.11 MIL/uL Final  . Hemoglobin 02/26/2013 10.4* 12.0 - 15.0 g/dL Final  . HCT 02/26/2013 32.0* 36.0 - 46.0 % Final  . MCV 02/26/2013 90.1  78.0 - 100.0 fL Final  . MCH 02/26/2013 29.3  26.0 - 34.0 pg Final  . MCHC 02/26/2013 32.5  30.0 - 36.0 g/dL Final  . RDW 02/26/2013 14.6  11.5 - 15.5 % Final  . Platelets 02/26/2013 189  150 - 400 K/uL Final      Assessment/Plan  1. Urinary frequency Related to UTI  2. History of fall Moderate back pains. To see Dr. Maxie Better on 04/27/13.  3. Anemia,  chronic disease Follow up lab  4.  Hyponatremia Recheck lab in 2 weeks  5. Hyperglycemia Recheck lab in 2 weeks  6. UTI (urinary tract infection) Recheck urine culture 1 week after of latest antibiotic.  7. Abnormal CT scan, chest observe  8. Insomnia observe

## 2013-04-27 DIAGNOSIS — M545 Low back pain, unspecified: Secondary | ICD-10-CM | POA: Diagnosis not present

## 2013-05-02 DIAGNOSIS — M48061 Spinal stenosis, lumbar region without neurogenic claudication: Secondary | ICD-10-CM | POA: Diagnosis not present

## 2013-05-02 DIAGNOSIS — M4120 Other idiopathic scoliosis, site unspecified: Secondary | ICD-10-CM | POA: Diagnosis not present

## 2013-05-02 DIAGNOSIS — M25559 Pain in unspecified hip: Secondary | ICD-10-CM | POA: Diagnosis not present

## 2013-05-07 DIAGNOSIS — N390 Urinary tract infection, site not specified: Secondary | ICD-10-CM | POA: Diagnosis not present

## 2013-05-07 DIAGNOSIS — R7309 Other abnormal glucose: Secondary | ICD-10-CM | POA: Diagnosis not present

## 2013-05-07 DIAGNOSIS — E871 Hypo-osmolality and hyponatremia: Secondary | ICD-10-CM | POA: Diagnosis not present

## 2013-05-07 DIAGNOSIS — D638 Anemia in other chronic diseases classified elsewhere: Secondary | ICD-10-CM | POA: Diagnosis not present

## 2013-05-07 LAB — BASIC METABOLIC PANEL
BUN: 14 mg/dL (ref 4–21)
Creatinine: 0.6 mg/dL (ref 0.5–1.1)
Glucose: 109 mg/dL
Potassium: 4.6 mmol/L (ref 3.4–5.3)
SODIUM: 135 mmol/L — AB (ref 137–147)

## 2013-05-07 LAB — CBC AND DIFFERENTIAL
HEMATOCRIT: 37 % (ref 36–46)
Hemoglobin: 12.3 g/dL (ref 12.0–16.0)
PLATELETS: 262 10*3/uL (ref 150–399)
WBC: 8.7 10^3/mL

## 2013-05-08 DIAGNOSIS — R269 Unspecified abnormalities of gait and mobility: Secondary | ICD-10-CM | POA: Diagnosis not present

## 2013-05-08 DIAGNOSIS — M6281 Muscle weakness (generalized): Secondary | ICD-10-CM | POA: Diagnosis not present

## 2013-05-08 DIAGNOSIS — M545 Low back pain, unspecified: Secondary | ICD-10-CM | POA: Diagnosis not present

## 2013-05-08 DIAGNOSIS — R279 Unspecified lack of coordination: Secondary | ICD-10-CM | POA: Diagnosis not present

## 2013-05-08 DIAGNOSIS — IMO0002 Reserved for concepts with insufficient information to code with codable children: Secondary | ICD-10-CM | POA: Diagnosis not present

## 2013-05-10 ENCOUNTER — Other Ambulatory Visit: Payer: Self-pay | Admitting: *Deleted

## 2013-05-10 ENCOUNTER — Telehealth: Payer: Self-pay

## 2013-05-10 MED ORDER — VENLAFAXINE HCL 75 MG PO TABS: 75.0000 mg | ORAL_TABLET | Freq: Every day | ORAL | Status: DC

## 2013-05-10 MED ORDER — DOXYCYCLINE HYCLATE 100 MG PO TABS: 100.0000 mg | ORAL_TABLET | Freq: Two times a day (BID) | ORAL | Status: DC

## 2013-05-10 NOTE — Telephone Encounter (Signed)
Received urine culture results 05/07/13 showed 85,000 colonies Staph, per Dr. Nyoka Cowden will fax in Doxycycline 100mg  twice daily #20. Called patient to let her know, will fax to [facility], patient wants it delivered

## 2013-05-10 NOTE — Progress Notes (Signed)
E-faxed prescription to [facility] per Dr. Nyoka Cowden. Changes to medication are based on Pharmacogenetic testing done on 04/24/2013. Spoke with the nurse Nat Christen), about these changes.

## 2013-05-15 ENCOUNTER — Encounter: Payer: Self-pay | Admitting: Internal Medicine

## 2013-05-15 ENCOUNTER — Non-Acute Institutional Stay: Payer: Medicare Other | Admitting: Internal Medicine

## 2013-05-15 VITALS — BP 122/62 | HR 64 | Wt 123.0 lb

## 2013-05-15 DIAGNOSIS — D638 Anemia in other chronic diseases classified elsewhere: Secondary | ICD-10-CM

## 2013-05-15 DIAGNOSIS — R269 Unspecified abnormalities of gait and mobility: Secondary | ICD-10-CM | POA: Diagnosis not present

## 2013-05-15 DIAGNOSIS — R279 Unspecified lack of coordination: Secondary | ICD-10-CM | POA: Diagnosis not present

## 2013-05-15 DIAGNOSIS — N390 Urinary tract infection, site not specified: Secondary | ICD-10-CM

## 2013-05-15 DIAGNOSIS — M545 Low back pain, unspecified: Secondary | ICD-10-CM | POA: Diagnosis not present

## 2013-05-15 DIAGNOSIS — D72829 Elevated white blood cell count, unspecified: Secondary | ICD-10-CM | POA: Diagnosis not present

## 2013-05-15 DIAGNOSIS — G4700 Insomnia, unspecified: Secondary | ICD-10-CM | POA: Diagnosis not present

## 2013-05-15 DIAGNOSIS — M6281 Muscle weakness (generalized): Secondary | ICD-10-CM | POA: Diagnosis not present

## 2013-05-15 DIAGNOSIS — R350 Frequency of micturition: Secondary | ICD-10-CM

## 2013-05-15 DIAGNOSIS — E871 Hypo-osmolality and hyponatremia: Secondary | ICD-10-CM

## 2013-05-15 DIAGNOSIS — R7309 Other abnormal glucose: Secondary | ICD-10-CM

## 2013-05-15 DIAGNOSIS — IMO0002 Reserved for concepts with insufficient information to code with codable children: Secondary | ICD-10-CM | POA: Diagnosis not present

## 2013-05-15 DIAGNOSIS — R739 Hyperglycemia, unspecified: Secondary | ICD-10-CM

## 2013-05-15 NOTE — Progress Notes (Addendum)
Patient ID: Cassandra Glenn, female   DOB: Nov 19, 1920, 80 y.o.   MRN: [id number]    Location:  [facility]  Place of Service: CLINIC    Allergies  Allergen Reactions  . Macrodantin [Nitrofurantoin Macrocrystal]     unknown  . Septra [Sulfamethoxazole-Trimethoprim]     unknown    Chief Complaint  Patient presents with  . Medical Management of Chronic Issues    hyperglycemia, hyponatremia. With daughter [name]     HPI:  She is beginning to feel better. Has made trip to see her new grandchild.  UTI (urinary tract infection): continued problem. Staph in last culture. Now on doxycycline.Still with urinary frequency and nocturia. No dysuria.  Anemia, chronic disease: resolved  Leukocytosis: resolved  Insomnia: persists ans seems related to nocturia. Voids at least hourly.  Urinary frequency: persists  Hyponatremia: resolved  Hyperglycemia: 109 on 05/07/13  Pharmacogenetic testing for treatment guidance suggested citalopram should be used with caution. I chose to substitute venlafaxine as a safer choice. She is now off citalopram and is taking venlafaxine 75 mg qd.    Medications: Patient's Medications  New Prescriptions   No medications on file  Previous Medications   ACETAMINOPHEN (TYLENOL) 500 MG TABLET    Take 500 mg by mouth every 6 (six) hours as needed for pain.   ASPIRIN EC 81 MG TABLET    Take 81 mg by mouth daily.   BILBERRY 1000 MG CAPS    Take 1 capsule by mouth daily.   CALCIUM CARBONATE-VITAMIN D (CALCIUM 600/VITAMIN D PO)    Take by mouth 2 (two) times daily.   CALCIUM POLYCARBOPHIL (FIBERCON PO)    Take 2 tablets by mouth daily.   CHOLECALCIFEROL (VITAMIN D) 400 UNITS TABS    Take 400 Units by mouth daily. Take 1 tablet daily for vitamin supplement.   DOXYCYCLINE (VIBRA-TABS) 100 MG TABLET    Take 1 tablet (100 mg total) by mouth 2 (two) times daily.   FISH OIL-OMEGA-3 FATTY ACIDS 1000 MG CAPSULE    Take 1 g by mouth daily. Take 1 capsule daily for vitamin  supplement.   GLUCOSAMINE-VITAMIN D (GLUCOSAMINE PLUS VITAMIN D) 1500-200 MG-UNIT TABS    Take 1 tablet by mouth 2 (two) times daily. Take 1 tablet twice daily for joint health   HYDROCODONE-ACETAMINOPHEN (NORCO/VICODIN) 5-325 MG PER TABLET    Take 1-2 tablets by mouth every 4 (four) hours as needed for moderate pain.   LUTEIN 6 MG TABS    Take 1 tablet by mouth daily.   MULTIPLE VITAMINS-MINERALS (PRESERVISION AREDS 2) CAPS    Take 2 capsules by mouth daily.   MULTIPLE VITAMINS-MINERALS (STRESS FORMULA 500/ZINC) TABS    Take 1 tablet by mouth daily.   ONDANSETRON (ZOFRAN ODT) 8 MG DISINTEGRATING TABLET    Take 1 tablet (8 mg total) by mouth every 8 (eight) hours as needed for nausea.   POLYETHYLENE GLYCOL (MIRALAX / GLYCOLAX) PACKET    Take 17 g by mouth at bedtime.   PROBIOTIC PRODUCT (RESTORA) CAPS    Take 1 capsule by mouth daily.   VENLAFAXINE (EFFEXOR) 75 MG TABLET    Take 1 tablet (75 mg total) by mouth daily.   ZOLPIDEM (AMBIEN) 5 MG TABLET    Take 2.5 mg by mouth at bedtime as needed for sleep.  Modified Medications   No medications on file  Discontinued Medications   CEPHALEXIN (KEFLEX) 500 MG CAPSULE    Take 500 mg by mouth. Take one tablet twice daily  for 7 days. Started 04/23/13   PHENAZOPYRIDINE (PYRIDIUM) 200 MG TABLET    Take 1 tablet (200 mg total) by mouth 3 (three) times daily.   PHENAZOPYRIDINE HCL 97.5 MG TABS    Take 195 mg by mouth 3 (three) times daily as needed (for urinary pain).     Review of Systems  Constitutional: Negative for fever, activity change, fatigue and unexpected weight change.  HENT: Positive for hearing loss. Negative for ear pain.   Eyes:       History of loss of visual acuity and macular degeneration.  Respiratory:       History of breast discomfort. History of removal of a lump of cancer from the right breast.  Cardiovascular: Negative for chest pain, palpitations and leg swelling.  Gastrointestinal:       Chronic sluggish stools and straining.  She frequently complains that her intestines are not working right. There is a previous history of diverticulosis. Her last colonoscopy was in 2008. She has been seen by Dr. Paulita Fujita in the past. Complains of bloated feeling.  Genitourinary:       Complains of a slow, weak urinary stream. Denies dysuria. Has increased frequency and nocturia x 8. Vaginal discomfort. Managed by Dr. Claudean Kinds.  Musculoskeletal:       Chronic back pain. Pain down the right leg. Some restriction of joint motion of the right hip.. She feels that there is something loose in the right hip area. There is generalized stiffness in the joints. There is a history of spinal stenosis. Left third finger catches on extension sometimes. There is a trigger finger. Pain at the left iliac crest and left anterior ribs near the sternum.  Skin:       History of skin discoloration and changes of nails. Previous history of folliculitis of the left lower leg. Folliculitis has cleared. Denies itching or rash.  Allergic/Immunologic: Negative.   Neurological: Negative for dizziness, tremors, seizures and numbness.       History of spinal stenosis with neurogenic claudication affecting the right leg. Complains of pain in the buttocks that is suggestive of neuralgia from her spinal stenosis.  Hematological: Bruises/bleeds easily.  Psychiatric/Behavioral: Negative.     Filed Vitals:   05/15/13 0930  BP: 122/62  Pulse: 64  Weight: 123 lb (55.792 kg)   Physical Exam  Constitutional: She is oriented to person, place, and time. She appears well-developed and well-nourished. No distress.  HENT:  Head: Normocephalic and atraumatic.  Right Ear: External ear normal.  Left Ear: External ear normal.  Nose: Nose normal.  Mouth/Throat: Oropharynx is clear and moist. No oropharyngeal exudate.  Eyes: Conjunctivae and EOM are normal. Pupils are equal, round, and reactive to light. Right eye exhibits no discharge. Left eye exhibits no discharge. No  scleral icterus.  Neck: Normal range of motion. Neck supple. No JVD present. No tracheal deviation present. No thyromegaly present.  Cardiovascular: Normal rate, regular rhythm, normal heart sounds and intact distal pulses.   No murmur heard. Pulmonary/Chest: Effort normal and breath sounds normal. No stridor. No respiratory distress. She has no wheezes. She has no rales. She exhibits no tenderness.  Abdominal: Soft. Bowel sounds are normal. She exhibits no distension. There is no tenderness. There is no rebound and no guarding.  Musculoskeletal: Normal range of motion. She exhibits tenderness. She exhibits no edema.  Anterior left lower rib cage pain near the sternum. Tender in the left SI joint area and along the left iliac crest. Unstable gait. Using 4  wheel walker with brakes and seat.  Lymphadenopathy:    She has no cervical adenopathy.  Neurological: She is alert and oriented to person, place, and time. She has normal reflexes. No cranial nerve deficit. She exhibits normal muscle tone. Coordination normal.  Skin: Skin is warm and dry. No rash noted. She is not diaphoretic. No erythema.  Scaly area on the right shoulder blade.    Psychiatric: She has a normal mood and affect. Her behavior is normal. Judgment and thought content normal.     Labs reviewed: Nursing Home on 05/15/2013  Component Date Value Ref Range Status  . Hemoglobin 05/07/2013 12.3  12.0 - 16.0 g/dL Final  . HCT 05/07/2013 37  36 - 46 % Final  . Platelets 05/07/2013 262  150 - 399 K/L Final  . WBC 05/07/2013 8.7   Final  . Glucose 05/07/2013 109   Final  . BUN 05/07/2013 14  4 - 21 mg/dL Final  . Creatinine 05/07/2013 0.6  0.5 - 1.1 mg/dL Final  . Potassium 05/07/2013 4.6  3.4 - 5.3 mmol/L Final  . Sodium 05/07/2013 135* 137 - 147 mmol/L Final  Nursing Home on 04/24/2013  Component Date Value Ref Range Status  . Hemoglobin 04/19/2013 11.6* 12.0 - 16.0 g/dL Final  . HCT 04/19/2013 34* 36 - 46 % Final  .  Platelets 04/19/2013 288  150 - 399 K/L Final  . WBC 04/19/2013 7.4   Final  . Glucose 04/19/2013 112   Final  . BUN 04/19/2013 11  4 - 21 mg/dL Final  . Creatinine 04/19/2013 0.6  0.5 - 1.1 mg/dL Final  . Potassium 04/19/2013 4.5  3.4 - 5.3 mmol/L Final  . Sodium 04/19/2013 131* 137 - 147 mmol/L Final  . Alkaline Phosphatase 04/19/2013 74  25 - 125 U/L Final  . ALT 04/19/2013 19  7 - 35 U/L Final  . AST 04/19/2013 25  13 - 35 U/L Final  . Bilirubin, Total 04/19/2013 0.5   Final  Admission on 04/16/2013, Discharged on 04/16/2013  Component Date Value Ref Range Status  . Color, Urine 04/16/2013 YELLOW  YELLOW Final  . APPearance 04/16/2013 CLOUDY* CLEAR Final  . Specific Gravity, Urine 04/16/2013 1.004* 1.005 - 1.030 Final  . pH 04/16/2013 7.0  5.0 - 8.0 Final  . Glucose, UA 04/16/2013 NEGATIVE  NEGATIVE mg/dL Final  . Hgb urine dipstick 04/16/2013 TRACE* NEGATIVE Final  . Bilirubin Urine 04/16/2013 NEGATIVE  NEGATIVE Final  . Ketones, ur 04/16/2013 NEGATIVE  NEGATIVE mg/dL Final  . Protein, ur 04/16/2013 NEGATIVE  NEGATIVE mg/dL Final  . Urobilinogen, UA 04/16/2013 0.2  0.0 - 1.0 mg/dL Final  . Nitrite 04/16/2013 POSITIVE* NEGATIVE Final  . Leukocytes, UA 04/16/2013 LARGE* NEGATIVE Final  . Specimen Description 04/16/2013 URINE, RANDOM   Final  . Special Requests 04/16/2013 Normal   Final  . Culture  Setup Time 04/16/2013    Final                   Value:04/16/2013 09:16                         Performed at [facility]  . Colony Count 04/16/2013    Final                   Value:>=100,000 COLONIES/ML  Performed at [facility]  . Culture 04/16/2013    Final                   Value:PSEUDOMONAS AERUGINOSA                         Performed at [facility]  . Report Status 04/16/2013 04/18/2013 FINAL   Final  . Organism ID, Bacteria 04/16/2013 PSEUDOMONAS AERUGINOSA   Final  . WBC, UA 04/16/2013 TOO NUMEROUS TO COUNT  <3 WBC/hpf Final    . RBC / HPF 04/16/2013 0-2  <3 RBC/hpf Final  . Bacteria, UA 04/16/2013 MANY* RARE Final  . Sodium 04/16/2013 134* 137 - 147 mEq/L Final  . Potassium 04/16/2013 3.7  3.7 - 5.3 mEq/L Final  . Chloride 04/16/2013 95* 96 - 112 mEq/L Final  . BUN 04/16/2013 7  6 - 23 mg/dL Final  . Creatinine, Ser 04/16/2013 0.60  0.50 - 1.10 mg/dL Final  . Glucose, Bld 04/16/2013 122* 70 - 99 mg/dL Final  . Calcium, Ion 04/16/2013 1.21  1.13 - 1.30 mmol/L Final  . TCO2 04/16/2013 26  0 - 100 mmol/L Final  . Hemoglobin 04/16/2013 12.2  12.0 - 15.0 g/dL Final  . HCT 04/16/2013 36.0  36.0 - 46.0 % Final  Admission on 02/25/2013, Discharged on 02/26/2013  Component Date Value Ref Range Status  . WBC 02/25/2013 10.2  4.0 - 10.5 K/uL Final  . RBC 02/25/2013 3.82* 3.87 - 5.11 MIL/uL Final  . Hemoglobin 02/25/2013 11.5* 12.0 - 15.0 g/dL Final  . HCT 02/25/2013 34.2* 36.0 - 46.0 % Final  . MCV 02/25/2013 89.5  78.0 - 100.0 fL Final  . MCH 02/25/2013 30.1  26.0 - 34.0 pg Final  . MCHC 02/25/2013 33.6  30.0 - 36.0 g/dL Final  . RDW 02/25/2013 14.4  11.5 - 15.5 % Final  . Platelets 02/25/2013 213  150 - 400 K/uL Final  . Neutrophils Relative % 02/25/2013 82* 43 - 77 % Final  . Neutro Abs 02/25/2013 8.3* 1.7 - 7.7 K/uL Final  . Lymphocytes Relative 02/25/2013 8* 12 - 46 % Final  . Lymphs Abs 02/25/2013 0.8  0.7 - 4.0 K/uL Final  . Monocytes Relative 02/25/2013 8  3 - 12 % Final  . Monocytes Absolute 02/25/2013 0.8  0.1 - 1.0 K/uL Final  . Eosinophils Relative 02/25/2013 3  0 - 5 % Final  . Eosinophils Absolute 02/25/2013 0.3  0.0 - 0.7 K/uL Final  . Basophils Relative 02/25/2013 0  0 - 1 % Final  . Basophils Absolute 02/25/2013 0.0  0.0 - 0.1 K/uL Final  . Sodium 02/25/2013 134* 137 - 147 mEq/L Final  . Potassium 02/25/2013 4.1  3.7 - 5.3 mEq/L Final  . Chloride 02/25/2013 98  96 - 112 mEq/L Final  . CO2 02/25/2013 23  19 - 32 mEq/L Final  . Glucose, Bld 02/25/2013 161* 70 - 99 mg/dL Final  . BUN 02/25/2013  17  6 - 23 mg/dL Final  . Creatinine, Ser 02/25/2013 0.60  0.50 - 1.10 mg/dL Final  . Calcium 02/25/2013 8.8  8.4 - 10.5 mg/dL Final  . Total Protein 02/25/2013 6.7  6.0 - 8.3 g/dL Final  . Albumin 02/25/2013 3.6  3.5 - 5.2 g/dL Final  . AST 02/25/2013 29  0 - 37 U/L Final  . ALT 02/25/2013 20  0 - 35 U/L Final  . Alkaline Phosphatase 02/25/2013 85  39 - 117 U/L  Final  . Total Bilirubin 02/25/2013 0.4  0.3 - 1.2 mg/dL Final  . GFR calc non Af Amer 02/25/2013 78* >90 mL/min Final  . GFR calc Af Amer 02/25/2013 >90  >90 mL/min Final   Comment: (NOTE)                          The eGFR has been calculated using the CKD EPI equation.                          This calculation has not been validated in all clinical situations.                          eGFR's persistently <90 mL/min signify possible Chronic Kidney                          Disease.  . Lipase 02/25/2013 22  11 - 59 U/L Final  . Color, Urine 02/25/2013 YELLOW  YELLOW Final  . APPearance 02/25/2013 CLEAR  CLEAR Final  . Specific Gravity, Urine 02/25/2013 1.016  1.005 - 1.030 Final  . pH 02/25/2013 7.0  5.0 - 8.0 Final  . Glucose, UA 02/25/2013 NEGATIVE  NEGATIVE mg/dL Final  . Hgb urine dipstick 02/25/2013 NEGATIVE  NEGATIVE Final  . Bilirubin Urine 02/25/2013 NEGATIVE  NEGATIVE Final  . Ketones, ur 02/25/2013 NEGATIVE  NEGATIVE mg/dL Final  . Protein, ur 02/25/2013 NEGATIVE  NEGATIVE mg/dL Final  . Urobilinogen, UA 02/25/2013 0.2  0.0 - 1.0 mg/dL Final  . Nitrite 02/25/2013 NEGATIVE  NEGATIVE Final  . Leukocytes, UA 02/25/2013 TRACE* NEGATIVE Final  . ABO/RH(D) 02/25/2013 A POS   Final  . Antibody Screen 02/25/2013 NEG   Final  . Sample Expiration 02/25/2013 02/28/2013   Final  . aPTT 02/25/2013 27  24 - 37 seconds Final  . Prothrombin Time 02/25/2013 12.5  11.6 - 15.2 seconds Final  . INR 02/25/2013 0.95  0.00 - 1.49 Final  . Lactic Acid, Venous 02/25/2013 0.91  0.5 - 2.2 mmol/L Final  . ABO/RH(D) 02/25/2013 A POS   Final   . Fecal Occult Bld 02/25/2013 NEGATIVE  NEGATIVE Final  . WBC 02/25/2013 12.1* 4.0 - 10.5 K/uL Final  . RBC 02/25/2013 4.08  3.87 - 5.11 MIL/uL Final  . Hemoglobin 02/25/2013 12.0  12.0 - 15.0 g/dL Final  . HCT 02/25/2013 36.8  36.0 - 46.0 % Final  . MCV 02/25/2013 90.2  78.0 - 100.0 fL Final  . MCH 02/25/2013 29.4  26.0 - 34.0 pg Final  . MCHC 02/25/2013 32.6  30.0 - 36.0 g/dL Final  . RDW 02/25/2013 14.4  11.5 - 15.5 % Final  . Platelets 02/25/2013 232  150 - 400 K/uL Final  . WBC, UA 02/25/2013 3-6  <3 WBC/hpf Final  . Sodium 02/25/2013 135* 137 - 147 mEq/L Final  . Potassium 02/25/2013 4.3  3.7 - 5.3 mEq/L Final  . Chloride 02/25/2013 97  96 - 112 mEq/L Final  . CO2 02/25/2013 24  19 - 32 mEq/L Final  . Glucose, Bld 02/25/2013 128* 70 - 99 mg/dL Final  . BUN 02/25/2013 15  6 - 23 mg/dL Final  . Creatinine, Ser 02/25/2013 0.59  0.50 - 1.10 mg/dL Final  . Calcium 02/25/2013 8.7  8.4 - 10.5 mg/dL Final  . Total Protein 02/25/2013 6.8  6.0 - 8.3 g/dL Final  .  Albumin 02/25/2013 3.5  3.5 - 5.2 g/dL Final  . AST 02/25/2013 28  0 - 37 U/L Final  . ALT 02/25/2013 21  0 - 35 U/L Final  . Alkaline Phosphatase 02/25/2013 84  39 - 117 U/L Final  . Total Bilirubin 02/25/2013 0.5  0.3 - 1.2 mg/dL Final  . GFR calc non Af Amer 02/25/2013 78* >90 mL/min Final  . GFR calc Af Amer 02/25/2013 >90  >90 mL/min Final   Comment: (NOTE)                          The eGFR has been calculated using the CKD EPI equation.                          This calculation has not been validated in all clinical situations.                          eGFR's persistently <90 mL/min signify possible Chronic Kidney                          Disease.  . Magnesium 02/25/2013 2.1  1.5 - 2.5 mg/dL Final  . Phosphorus 02/25/2013 2.6  2.3 - 4.6 mg/dL Final  . WBC 02/25/2013 8.4  4.0 - 10.5 K/uL Final  . RBC 02/25/2013 4.06  3.87 - 5.11 MIL/uL Final  . Hemoglobin 02/25/2013 12.0  12.0 - 15.0 g/dL Final  . HCT 02/25/2013 36.3   36.0 - 46.0 % Final  . MCV 02/25/2013 89.4  78.0 - 100.0 fL Final  . MCH 02/25/2013 29.6  26.0 - 34.0 pg Final  . MCHC 02/25/2013 33.1  30.0 - 36.0 g/dL Final  . RDW 02/25/2013 14.3  11.5 - 15.5 % Final  . Platelets 02/25/2013 211  150 - 400 K/uL Final  . Neutrophils Relative % 02/25/2013 85* 43 - 77 % Final  . Neutro Abs 02/25/2013 7.2  1.7 - 7.7 K/uL Final  . Lymphocytes Relative 02/25/2013 10* 12 - 46 % Final  . Lymphs Abs 02/25/2013 0.8  0.7 - 4.0 K/uL Final  . Monocytes Relative 02/25/2013 4  3 - 12 % Final  . Monocytes Absolute 02/25/2013 0.4  0.1 - 1.0 K/uL Final  . Eosinophils Relative 02/25/2013 1  0 - 5 % Final  . Eosinophils Absolute 02/25/2013 0.1  0.0 - 0.7 K/uL Final  . Basophils Relative 02/25/2013 0  0 - 1 % Final  . Basophils Absolute 02/25/2013 0.0  0.0 - 0.1 K/uL Final  . aPTT 02/25/2013 28  24 - 37 seconds Final  . Prothrombin Time 02/25/2013 12.9  11.6 - 15.2 seconds Final  . INR 02/25/2013 0.99  0.00 - 1.49 Final  . Specimen Description 02/25/2013 URINE, CLEAN CATCH   Final  . Special Requests 02/25/2013 NONE   Final  . Culture  Setup Time 02/25/2013    Final                   Value:02/25/2013 20:35                         Performed at [facility]  . Colony Count 02/25/2013    Final                   Value:NO GROWTH  Performed at [facility]  . Culture 02/25/2013    Final                   Value:NO GROWTH                         Performed at [facility]  . Report Status 02/25/2013 02/26/2013 FINAL   Final  . Color, Urine 02/25/2013 YELLOW  YELLOW Final  . APPearance 02/25/2013 CLEAR  CLEAR Final  . Specific Gravity, Urine 02/25/2013 1.019  1.005 - 1.030 Final  . pH 02/25/2013 6.5  5.0 - 8.0 Final  . Glucose, UA 02/25/2013 NEGATIVE  NEGATIVE mg/dL Final  . Hgb urine dipstick 02/25/2013 MODERATE* NEGATIVE Final  . Bilirubin Urine 02/25/2013 NEGATIVE  NEGATIVE Final  . Ketones, ur 02/25/2013 15* NEGATIVE mg/dL  Final  . Protein, ur 02/25/2013 NEGATIVE  NEGATIVE mg/dL Final  . Urobilinogen, UA 02/25/2013 0.2  0.0 - 1.0 mg/dL Final  . Nitrite 02/25/2013 NEGATIVE  NEGATIVE Final  . Leukocytes, UA 02/25/2013 NEGATIVE  NEGATIVE Final  . Squamous Epithelial / LPF 02/25/2013 FEW* RARE Final  . WBC, UA 02/25/2013 3-6  <3 WBC/hpf Final  . RBC / HPF 02/25/2013 11-20  <3 RBC/hpf Final  . Sodium 02/26/2013 133* 137 - 147 mEq/L Final  . Potassium 02/26/2013 3.7  3.7 - 5.3 mEq/L Final  . Chloride 02/26/2013 99  96 - 112 mEq/L Final  . CO2 02/26/2013 26  19 - 32 mEq/L Final  . Glucose, Bld 02/26/2013 95  70 - 99 mg/dL Final  . BUN 02/26/2013 11  6 - 23 mg/dL Final  . Creatinine, Ser 02/26/2013 0.61  0.50 - 1.10 mg/dL Final  . Calcium 02/26/2013 8.4  8.4 - 10.5 mg/dL Final  . Total Protein 02/26/2013 5.8* 6.0 - 8.3 g/dL Final  . Albumin 02/26/2013 2.9* 3.5 - 5.2 g/dL Final  . AST 02/26/2013 20  0 - 37 U/L Final  . ALT 02/26/2013 16  0 - 35 U/L Final  . Alkaline Phosphatase 02/26/2013 66  39 - 117 U/L Final  . Total Bilirubin 02/26/2013 0.3  0.3 - 1.2 mg/dL Final  . GFR calc non Af Amer 02/26/2013 77* >90 mL/min Final  . GFR calc Af Amer 02/26/2013 90* >90 mL/min Final   Comment: (NOTE)                          The eGFR has been calculated using the CKD EPI equation.                          This calculation has not been validated in all clinical situations.                          eGFR's persistently <90 mL/min signify possible Chronic Kidney                          Disease.  . WBC 02/26/2013 5.0  4.0 - 10.5 K/uL Final  . RBC 02/26/2013 3.55* 3.87 - 5.11 MIL/uL Final  . Hemoglobin 02/26/2013 10.4* 12.0 - 15.0 g/dL Final  . HCT 02/26/2013 32.0* 36.0 - 46.0 % Final  . MCV 02/26/2013 90.1  78.0 - 100.0 fL Final  . MCH 02/26/2013 29.3  26.0 - 34.0 pg Final  . MCHC 02/26/2013 32.5  30.0 - 36.0 g/dL Final  . RDW 02/26/2013 14.6  11.5 - 15.5 % Final  . Platelets 02/26/2013 189  150 - 400 K/uL Final       Assessment/Plan  1. UTI (urinary tract infection) Finish doxycycline and then repeat UA and culture after about a week  2. Anemia, chronic disease resolved  3. Leukocytosis resolved  4. Insomnia Related to urinary frequency at night. May need to see urologist.  5. Urinary frequency May need to see urologist if still present after treating UTI.  6. Hyponatremia resolved  7. Hyperglycemia improved

## 2013-05-16 NOTE — Addendum Note (Signed)
Addended by: Estill Dooms on: 05/16/2013 10:13 AM   Modules accepted: Medications

## 2013-05-17 DIAGNOSIS — M545 Low back pain, unspecified: Secondary | ICD-10-CM | POA: Diagnosis not present

## 2013-05-17 DIAGNOSIS — IMO0002 Reserved for concepts with insufficient information to code with codable children: Secondary | ICD-10-CM | POA: Diagnosis not present

## 2013-05-17 DIAGNOSIS — M6281 Muscle weakness (generalized): Secondary | ICD-10-CM | POA: Diagnosis not present

## 2013-05-17 DIAGNOSIS — R279 Unspecified lack of coordination: Secondary | ICD-10-CM | POA: Diagnosis not present

## 2013-05-17 DIAGNOSIS — R269 Unspecified abnormalities of gait and mobility: Secondary | ICD-10-CM | POA: Diagnosis not present

## 2013-05-22 DIAGNOSIS — IMO0002 Reserved for concepts with insufficient information to code with codable children: Secondary | ICD-10-CM | POA: Diagnosis not present

## 2013-05-22 DIAGNOSIS — M6281 Muscle weakness (generalized): Secondary | ICD-10-CM | POA: Diagnosis not present

## 2013-05-22 DIAGNOSIS — M545 Low back pain, unspecified: Secondary | ICD-10-CM | POA: Diagnosis not present

## 2013-05-22 DIAGNOSIS — R269 Unspecified abnormalities of gait and mobility: Secondary | ICD-10-CM | POA: Diagnosis not present

## 2013-05-22 DIAGNOSIS — R279 Unspecified lack of coordination: Secondary | ICD-10-CM | POA: Diagnosis not present

## 2013-05-24 DIAGNOSIS — R279 Unspecified lack of coordination: Secondary | ICD-10-CM | POA: Diagnosis not present

## 2013-05-24 DIAGNOSIS — M545 Low back pain, unspecified: Secondary | ICD-10-CM | POA: Diagnosis not present

## 2013-05-24 DIAGNOSIS — IMO0002 Reserved for concepts with insufficient information to code with codable children: Secondary | ICD-10-CM | POA: Diagnosis not present

## 2013-05-24 DIAGNOSIS — R269 Unspecified abnormalities of gait and mobility: Secondary | ICD-10-CM | POA: Diagnosis not present

## 2013-05-24 DIAGNOSIS — M6281 Muscle weakness (generalized): Secondary | ICD-10-CM | POA: Diagnosis not present

## 2013-05-25 ENCOUNTER — Other Ambulatory Visit: Payer: Self-pay | Admitting: *Deleted

## 2013-05-25 MED ORDER — HYDROCODONE-ACETAMINOPHEN 5-325 MG PO TABS: 1.0000 | ORAL_TABLET | ORAL | Status: DC | PRN

## 2013-05-28 DIAGNOSIS — M545 Low back pain, unspecified: Secondary | ICD-10-CM | POA: Diagnosis not present

## 2013-05-28 DIAGNOSIS — M6281 Muscle weakness (generalized): Secondary | ICD-10-CM | POA: Diagnosis not present

## 2013-05-28 DIAGNOSIS — R279 Unspecified lack of coordination: Secondary | ICD-10-CM | POA: Diagnosis not present

## 2013-05-28 DIAGNOSIS — IMO0002 Reserved for concepts with insufficient information to code with codable children: Secondary | ICD-10-CM | POA: Diagnosis not present

## 2013-05-28 DIAGNOSIS — N390 Urinary tract infection, site not specified: Secondary | ICD-10-CM | POA: Diagnosis not present

## 2013-05-28 DIAGNOSIS — R269 Unspecified abnormalities of gait and mobility: Secondary | ICD-10-CM | POA: Diagnosis not present

## 2013-05-29 ENCOUNTER — Encounter: Payer: Self-pay | Admitting: Internal Medicine

## 2013-05-30 DIAGNOSIS — M545 Low back pain, unspecified: Secondary | ICD-10-CM | POA: Diagnosis not present

## 2013-05-30 DIAGNOSIS — R279 Unspecified lack of coordination: Secondary | ICD-10-CM | POA: Diagnosis not present

## 2013-05-30 DIAGNOSIS — IMO0002 Reserved for concepts with insufficient information to code with codable children: Secondary | ICD-10-CM | POA: Diagnosis not present

## 2013-05-30 DIAGNOSIS — R269 Unspecified abnormalities of gait and mobility: Secondary | ICD-10-CM | POA: Diagnosis not present

## 2013-05-30 DIAGNOSIS — M6281 Muscle weakness (generalized): Secondary | ICD-10-CM | POA: Diagnosis not present

## 2013-05-31 ENCOUNTER — Telehealth: Payer: Self-pay | Admitting: *Deleted

## 2013-05-31 DIAGNOSIS — Z961 Presence of intraocular lens: Secondary | ICD-10-CM | POA: Diagnosis not present

## 2013-05-31 DIAGNOSIS — H3560 Retinal hemorrhage, unspecified eye: Secondary | ICD-10-CM | POA: Diagnosis not present

## 2013-05-31 DIAGNOSIS — H353290 Exudative age-related macular degeneration, unspecified eye, stage unspecified: Secondary | ICD-10-CM | POA: Diagnosis not present

## 2013-05-31 DIAGNOSIS — H353190 Nonexudative age-related macular degeneration, unspecified eye, stage unspecified: Secondary | ICD-10-CM | POA: Diagnosis not present

## 2013-05-31 DIAGNOSIS — H35379 Puckering of macula, unspecified eye: Secondary | ICD-10-CM | POA: Diagnosis not present

## 2013-05-31 DIAGNOSIS — H43819 Vitreous degeneration, unspecified eye: Secondary | ICD-10-CM | POA: Diagnosis not present

## 2013-05-31 MED ORDER — CIPROFLOXACIN HCL 250 MG PO TABS: ORAL_TABLET | ORAL | Status: DC

## 2013-05-31 NOTE — Telephone Encounter (Signed)
Received Urine Culture 05/28/2013 from [facility]  Patient has UTI and per Dr. Nyoka Cowden call in Ciprofloxacin 250mg  Take one tablet twice daily for UTI #20 Patient Notified and faxed to [facility]

## 2013-06-04 ENCOUNTER — Encounter: Payer: Self-pay | Admitting: Internal Medicine

## 2013-06-04 DIAGNOSIS — R279 Unspecified lack of coordination: Secondary | ICD-10-CM | POA: Diagnosis not present

## 2013-06-04 DIAGNOSIS — M545 Low back pain, unspecified: Secondary | ICD-10-CM | POA: Diagnosis not present

## 2013-06-04 DIAGNOSIS — M6281 Muscle weakness (generalized): Secondary | ICD-10-CM | POA: Diagnosis not present

## 2013-06-04 DIAGNOSIS — R269 Unspecified abnormalities of gait and mobility: Secondary | ICD-10-CM | POA: Diagnosis not present

## 2013-06-04 DIAGNOSIS — IMO0002 Reserved for concepts with insufficient information to code with codable children: Secondary | ICD-10-CM | POA: Diagnosis not present

## 2013-06-06 DIAGNOSIS — R269 Unspecified abnormalities of gait and mobility: Secondary | ICD-10-CM | POA: Diagnosis not present

## 2013-06-06 DIAGNOSIS — IMO0002 Reserved for concepts with insufficient information to code with codable children: Secondary | ICD-10-CM | POA: Diagnosis not present

## 2013-06-06 DIAGNOSIS — M545 Low back pain, unspecified: Secondary | ICD-10-CM | POA: Diagnosis not present

## 2013-06-06 DIAGNOSIS — R279 Unspecified lack of coordination: Secondary | ICD-10-CM | POA: Diagnosis not present

## 2013-06-06 DIAGNOSIS — M6281 Muscle weakness (generalized): Secondary | ICD-10-CM | POA: Diagnosis not present

## 2013-06-11 DIAGNOSIS — R269 Unspecified abnormalities of gait and mobility: Secondary | ICD-10-CM | POA: Diagnosis not present

## 2013-06-11 DIAGNOSIS — IMO0002 Reserved for concepts with insufficient information to code with codable children: Secondary | ICD-10-CM | POA: Diagnosis not present

## 2013-06-11 DIAGNOSIS — R279 Unspecified lack of coordination: Secondary | ICD-10-CM | POA: Diagnosis not present

## 2013-06-11 DIAGNOSIS — R262 Difficulty in walking, not elsewhere classified: Secondary | ICD-10-CM | POA: Diagnosis not present

## 2013-06-11 DIAGNOSIS — M6281 Muscle weakness (generalized): Secondary | ICD-10-CM | POA: Diagnosis not present

## 2013-06-13 ENCOUNTER — Encounter: Payer: Self-pay | Admitting: Internal Medicine

## 2013-06-13 DIAGNOSIS — R262 Difficulty in walking, not elsewhere classified: Secondary | ICD-10-CM | POA: Diagnosis not present

## 2013-06-13 DIAGNOSIS — R279 Unspecified lack of coordination: Secondary | ICD-10-CM | POA: Diagnosis not present

## 2013-06-13 DIAGNOSIS — M6281 Muscle weakness (generalized): Secondary | ICD-10-CM | POA: Diagnosis not present

## 2013-06-13 DIAGNOSIS — R269 Unspecified abnormalities of gait and mobility: Secondary | ICD-10-CM | POA: Diagnosis not present

## 2013-06-13 DIAGNOSIS — IMO0002 Reserved for concepts with insufficient information to code with codable children: Secondary | ICD-10-CM | POA: Diagnosis not present

## 2013-06-18 DIAGNOSIS — R262 Difficulty in walking, not elsewhere classified: Secondary | ICD-10-CM | POA: Diagnosis not present

## 2013-06-18 DIAGNOSIS — R269 Unspecified abnormalities of gait and mobility: Secondary | ICD-10-CM | POA: Diagnosis not present

## 2013-06-18 DIAGNOSIS — R279 Unspecified lack of coordination: Secondary | ICD-10-CM | POA: Diagnosis not present

## 2013-06-18 DIAGNOSIS — M6281 Muscle weakness (generalized): Secondary | ICD-10-CM | POA: Diagnosis not present

## 2013-06-18 DIAGNOSIS — IMO0002 Reserved for concepts with insufficient information to code with codable children: Secondary | ICD-10-CM | POA: Diagnosis not present

## 2013-06-20 ENCOUNTER — Other Ambulatory Visit: Payer: Self-pay | Admitting: *Deleted

## 2013-06-20 DIAGNOSIS — R279 Unspecified lack of coordination: Secondary | ICD-10-CM | POA: Diagnosis not present

## 2013-06-20 DIAGNOSIS — R269 Unspecified abnormalities of gait and mobility: Secondary | ICD-10-CM | POA: Diagnosis not present

## 2013-06-20 DIAGNOSIS — IMO0002 Reserved for concepts with insufficient information to code with codable children: Secondary | ICD-10-CM | POA: Diagnosis not present

## 2013-06-20 DIAGNOSIS — R262 Difficulty in walking, not elsewhere classified: Secondary | ICD-10-CM | POA: Diagnosis not present

## 2013-06-20 DIAGNOSIS — M6281 Muscle weakness (generalized): Secondary | ICD-10-CM | POA: Diagnosis not present

## 2013-06-20 MED ORDER — HYDROCODONE-ACETAMINOPHEN 5-325 MG PO TABS: 1.0000 | ORAL_TABLET | ORAL | Status: DC | PRN

## 2013-06-26 DIAGNOSIS — IMO0002 Reserved for concepts with insufficient information to code with codable children: Secondary | ICD-10-CM | POA: Diagnosis not present

## 2013-06-26 DIAGNOSIS — M5137 Other intervertebral disc degeneration, lumbosacral region: Secondary | ICD-10-CM | POA: Diagnosis not present

## 2013-07-10 DIAGNOSIS — I739 Peripheral vascular disease, unspecified: Secondary | ICD-10-CM | POA: Diagnosis not present

## 2013-07-10 DIAGNOSIS — L608 Other nail disorders: Secondary | ICD-10-CM | POA: Diagnosis not present

## 2013-07-17 ENCOUNTER — Telehealth: Payer: Self-pay

## 2013-07-17 NOTE — Telephone Encounter (Signed)
Patient's daughter Wilford Grist) called to see what Dr.Green's plan of care is to help prevent future UTI's. [name] would also like to know if patient should have urine sample collected prior to next appointment (07/31/13). Patient does not have any symptoms right now.I reviewed last OV note and there was an indication that patient may be referred to Urologist.  Dr.Green please advise

## 2013-07-25 DIAGNOSIS — Z1231 Encounter for screening mammogram for malignant neoplasm of breast: Secondary | ICD-10-CM | POA: Diagnosis not present

## 2013-07-25 LAB — HM MAMMOGRAPHY: HM Mammogram: NEGATIVE

## 2013-07-26 ENCOUNTER — Encounter: Payer: Self-pay | Admitting: *Deleted

## 2013-07-27 NOTE — Telephone Encounter (Signed)
Dr.Green please advise if you seen this message was seen and address

## 2013-07-31 ENCOUNTER — Non-Acute Institutional Stay: Payer: Medicare Other | Admitting: Internal Medicine

## 2013-07-31 ENCOUNTER — Encounter: Payer: Self-pay | Admitting: Internal Medicine

## 2013-07-31 VITALS — BP 124/78 | HR 68 | Temp 97.8°F | Wt 125.0 lb

## 2013-07-31 DIAGNOSIS — G4700 Insomnia, unspecified: Secondary | ICD-10-CM | POA: Diagnosis not present

## 2013-07-31 DIAGNOSIS — R29898 Other symptoms and signs involving the musculoskeletal system: Secondary | ICD-10-CM

## 2013-07-31 DIAGNOSIS — R7309 Other abnormal glucose: Secondary | ICD-10-CM

## 2013-07-31 DIAGNOSIS — N3000 Acute cystitis without hematuria: Secondary | ICD-10-CM

## 2013-07-31 DIAGNOSIS — D692 Other nonthrombocytopenic purpura: Secondary | ICD-10-CM

## 2013-07-31 DIAGNOSIS — E871 Hypo-osmolality and hyponatremia: Secondary | ICD-10-CM

## 2013-07-31 DIAGNOSIS — D638 Anemia in other chronic diseases classified elsewhere: Secondary | ICD-10-CM | POA: Diagnosis not present

## 2013-07-31 DIAGNOSIS — M2669 Other specified disorders of temporomandibular joint: Secondary | ICD-10-CM

## 2013-07-31 DIAGNOSIS — R739 Hyperglycemia, unspecified: Secondary | ICD-10-CM

## 2013-07-31 HISTORY — DX: Other nonthrombocytopenic purpura: D69.2

## 2013-07-31 HISTORY — DX: Other symptoms and signs involving the musculoskeletal system: R29.898

## 2013-07-31 NOTE — Progress Notes (Signed)
Patient ID: Cassandra Glenn, female   DOB: 1920-10-16, 80 y.o.   MRN: [id number]    Location:  [facility]   Place of Service: Clinic (12)    Allergies  Allergen Reactions  . Macrodantin [Nitrofurantoin Macrocrystal]     unknown  . Septra [Sulfamethoxazole-Trimethoprim]     unknown    Chief Complaint  Patient presents with  . Medical Management of Chronic Issues    urinary frequency, insomnia. Where with daughter [name]   . multiple complains    spots on arms, jaws popping not all the time, is UTI clear.     HPI:  Purpura senilis: Purple spots on both forearms. Irregular in border. Some are fading into a bruised area. Patient denies any trauma to her arms. They appear to come spontaneously according to the patient. Patient to see ophthalmologist in June 2015 for a subconjunctival hemorrhage. This has resolved.  TMJ click: Patient says her jaws are popping. It is not painful; it is more of a nuisance than anything.  Anemia, chronic disease: needs followup  Insomnia: Improved  Hyponatremia: 135 on 05/07/13  Hyperglycemia: 109 on 05/07/13  Acute cystitis without hematuria: Recurrent episodes of acute cystitis. The daughter would like to know if there is anything can be done to prevent these episodes. Patient is willing to see a urologist.  Medications: Patient's Medications  New Prescriptions   No medications on file  Previous Medications   ACETAMINOPHEN (TYLENOL) 500 MG TABLET    Take 500 mg by mouth every 6 (six) hours as needed for pain.   ASPIRIN EC 81 MG TABLET    Take 81 mg by mouth daily.   BILBERRY 1000 MG CAPS    Take 1 capsule by mouth daily.   CALCIUM CARBONATE-VITAMIN D (CALCIUM 600/VITAMIN D PO)    Take by mouth 2 (two) times daily.   CALCIUM POLYCARBOPHIL (FIBERCON PO)    Take 2 tablets by mouth daily.   CHOLECALCIFEROL (VITAMIN D) 400 UNITS TABS    Take 400 Units by mouth daily. Take 1 tablet daily for vitamin supplement.   FISH OIL-OMEGA-3 FATTY ACIDS  1000 MG CAPSULE    Take 1 g by mouth daily. Take 1 capsule daily for vitamin supplement.   GLUCOSAMINE-VITAMIN D (GLUCOSAMINE PLUS VITAMIN D) 1500-200 MG-UNIT TABS    Take 1 tablet by mouth 2 (two) times daily. Take 1 tablet twice daily for joint health   LUTEIN 6 MG TABS    Take 1 tablet by mouth daily.   MULTIPLE VITAMINS-MINERALS (PRESERVISION AREDS 2) CAPS    Take 2 capsules by mouth daily.   MULTIPLE VITAMINS-MINERALS (STRESS FORMULA 500/ZINC) TABS    Take 1 tablet by mouth daily.   POLYETHYLENE GLYCOL (MIRALAX / GLYCOLAX) PACKET    Take 17 g by mouth at bedtime. As needed   PROBIOTIC PRODUCT (RESTORA) CAPS    Take 1 capsule by mouth daily.   VENLAFAXINE (EFFEXOR) 75 MG TABLET    Take 1 tablet (75 mg total) by mouth daily.   ZOLPIDEM (AMBIEN) 5 MG TABLET    Take 2.5 mg by mouth at bedtime as needed for sleep.  Modified Medications   No medications on file  Discontinued Medications   CIPROFLOXACIN (CIPRO) 250 MG TABLET    Take one tablet by mouth twice daily for UTI. Please Deliver to Patient   DOXYCYCLINE (VIBRA-TABS) 100 MG TABLET    Take 1 tablet (100 mg total) by mouth 2 (two) times daily.   HYDROCODONE-ACETAMINOPHEN (NORCO/VICODIN)  5-325 MG PER TABLET    Take 1-2 tablets by mouth every 4 (four) hours as needed for moderate pain.   ONDANSETRON (ZOFRAN ODT) 8 MG DISINTEGRATING TABLET    Take 1 tablet (8 mg total) by mouth every 8 (eight) hours as needed for nausea.     Review of Systems  Constitutional: Negative for fever, activity change, fatigue and unexpected weight change.  HENT: Positive for hearing loss. Negative for ear pain.   Eyes:       History of loss of visual acuity and macular degeneration.  Respiratory:       History of breast discomfort. History of removal of a lump of cancer from the right breast.  Cardiovascular: Negative for chest pain, palpitations and leg swelling.  Gastrointestinal:       Chronic sluggish stools and straining. She frequently complains that her  intestines are not working right. There is a previous history of diverticulosis. Her last colonoscopy was in 2008. She has been seen by Dr. Paulita Fujita in the past. Complains of bloated feeling.  Genitourinary:       Complains of a slow, weak urinary stream. Denies dysuria. Has increased frequency and nocturia x 4. Vaginal discomfort. Managed by Dr. Claudean Kinds.  Musculoskeletal:       Chronic back pain. Pain down the right leg. Some restriction of joint motion of the right hip.. She feels that there is something loose in the right hip area. There is generalized stiffness in the joints. There is a history of spinal stenosis. Left third finger catches on extension sometimes. There is a trigger finger. Pain at the left iliac crest and left anterior ribs near the sternum.  Skin:       History of skin discoloration and changes of nails. Previous history of folliculitis of the left lower leg. Folliculitis has cleared. Denies itching or rash.  Allergic/Immunologic: Negative.   Neurological: Negative for dizziness, tremors, seizures and numbness.       History of spinal stenosis with neurogenic claudication affecting the right leg. Complains of pain in the buttocks that is suggestive of neuralgia from her spinal stenosis.  Hematological: Bruises/bleeds easily.  Psychiatric/Behavioral: Negative.     Filed Vitals:   07/31/13 0941  BP: 124/78  Pulse: 68  Temp: 97.8 F (36.6 C)  TempSrc: Oral  Weight: 125 lb (56.7 kg)   Body mass index is 23.63 kg/(m^2).  Physical Exam  Constitutional: She is oriented to person, place, and time. She appears well-developed and well-nourished. No distress.  HENT:  Head: Normocephalic and atraumatic.  Right Ear: External ear normal.  Left Ear: External ear normal.  Nose: Nose normal.  Mouth/Throat: Oropharynx is clear and moist. No oropharyngeal exudate.  Eyes: Conjunctivae and EOM are normal. Pupils are equal, round, and reactive to light. Right eye exhibits no  discharge. Left eye exhibits no discharge. No scleral icterus.  Neck: Normal range of motion. Neck supple. No JVD present. No tracheal deviation present. No thyromegaly present.  Cardiovascular: Normal rate, regular rhythm, normal heart sounds and intact distal pulses.   No murmur heard. Pulmonary/Chest: Effort normal and breath sounds normal. No stridor. No respiratory distress. She has no wheezes. She has no rales. She exhibits no tenderness.  Abdominal: Soft. Bowel sounds are normal. She exhibits no distension. There is no tenderness. There is no rebound and no guarding.  Musculoskeletal: Normal range of motion. She exhibits tenderness. She exhibits no edema.  Anterior left lower rib cage pain near the sternum. Tender in the  left SI joint area and along the left iliac crest. Unstable gait. Using 4 wheel walker with brakes and seat.  Lymphadenopathy:    She has no cervical adenopathy.  Neurological: She is alert and oriented to person, place, and time. She has normal reflexes. No cranial nerve deficit. She exhibits normal muscle tone. Coordination normal.  Skin: Skin is warm and dry. No rash noted. She is not diaphoretic. No erythema.  Scaly area on the right shoulder blade.    Psychiatric: She has a normal mood and affect. Her behavior is normal. Judgment and thought content normal.     Labs reviewed: Abstract on 07/26/2013  Component Date Value Ref Range Status  . HM Mammogram 07/25/2013 Negative done at [facility] on 05/15/2013  Component Date Value Ref Range Status  . Hemoglobin 05/07/2013 12.3  12.0 - 16.0 g/dL Final  . HCT 05/07/2013 37  36 - 46 % Final  . Platelets 05/07/2013 262  150 - 399 K/L Final  . WBC 05/07/2013 8.7   Final  . Glucose 05/07/2013 109   Final  . BUN 05/07/2013 14  4 - 21 mg/dL Final  . Creatinine 05/07/2013 0.6  0.5 - 1.1 mg/dL Final  . Potassium 05/07/2013 4.6  3.4 - 5.3 mmol/L Final  . Sodium 05/07/2013 135* 137 - 147 mmol/L Final       Assessment/Plan  1. Purpura senilis -CBC  2. TMJ click observe  3. Anemia, chronic disease -CBC  4. Insomnia observe  5. Hyponatremia Near normal  6. Hyperglycemia Near normal  7. Acute cystitis without hematuria -UA and culture. Refer to urologist if there are any more infections

## 2013-07-31 NOTE — Telephone Encounter (Signed)
Discussed during her visit at [facility] today.

## 2013-08-02 ENCOUNTER — Encounter: Payer: Self-pay | Admitting: *Deleted

## 2013-08-02 DIAGNOSIS — N390 Urinary tract infection, site not specified: Secondary | ICD-10-CM | POA: Diagnosis not present

## 2013-08-02 DIAGNOSIS — D638 Anemia in other chronic diseases classified elsewhere: Secondary | ICD-10-CM | POA: Diagnosis not present

## 2013-08-02 LAB — CBC AND DIFFERENTIAL
HCT: 36 % (ref 36–46)
Hemoglobin: 12 g/dL (ref 12.0–16.0)
PLATELETS: 233 10*3/uL (ref 150–399)
WBC: 7.5 10^3/mL

## 2013-08-13 DIAGNOSIS — L821 Other seborrheic keratosis: Secondary | ICD-10-CM | POA: Diagnosis not present

## 2013-08-13 DIAGNOSIS — L408 Other psoriasis: Secondary | ICD-10-CM | POA: Diagnosis not present

## 2013-08-14 ENCOUNTER — Encounter: Payer: Self-pay | Admitting: Internal Medicine

## 2013-08-17 ENCOUNTER — Encounter: Payer: Self-pay | Admitting: *Deleted

## 2013-08-17 ENCOUNTER — Telehealth: Payer: Self-pay | Admitting: *Deleted

## 2013-08-17 NOTE — Telephone Encounter (Signed)
Lm for patient to call regarding lab results.

## 2013-08-17 NOTE — Telephone Encounter (Signed)
Message copied by Eilene Ghazi on Fri Aug 17, 2013  2:43 PM ------      Message from: Estill Dooms      Created: Thu Aug 16, 2013 12:03 PM       Call. Normal blood counts. ------

## 2013-09-12 DIAGNOSIS — H353290 Exudative age-related macular degeneration, unspecified eye, stage unspecified: Secondary | ICD-10-CM | POA: Diagnosis not present

## 2013-09-12 DIAGNOSIS — H35379 Puckering of macula, unspecified eye: Secondary | ICD-10-CM | POA: Diagnosis not present

## 2013-09-12 DIAGNOSIS — H2640 Unspecified secondary cataract: Secondary | ICD-10-CM | POA: Diagnosis not present

## 2013-09-12 DIAGNOSIS — Z961 Presence of intraocular lens: Secondary | ICD-10-CM | POA: Diagnosis not present

## 2013-09-13 DIAGNOSIS — H353190 Nonexudative age-related macular degeneration, unspecified eye, stage unspecified: Secondary | ICD-10-CM | POA: Diagnosis not present

## 2013-09-13 DIAGNOSIS — H35379 Puckering of macula, unspecified eye: Secondary | ICD-10-CM | POA: Diagnosis not present

## 2013-09-13 DIAGNOSIS — Z961 Presence of intraocular lens: Secondary | ICD-10-CM | POA: Diagnosis not present

## 2013-09-13 DIAGNOSIS — H353290 Exudative age-related macular degeneration, unspecified eye, stage unspecified: Secondary | ICD-10-CM | POA: Diagnosis not present

## 2013-09-13 DIAGNOSIS — H43819 Vitreous degeneration, unspecified eye: Secondary | ICD-10-CM | POA: Diagnosis not present

## 2013-09-13 DIAGNOSIS — H3560 Retinal hemorrhage, unspecified eye: Secondary | ICD-10-CM | POA: Diagnosis not present

## 2013-09-19 ENCOUNTER — Other Ambulatory Visit: Payer: Self-pay | Admitting: Internal Medicine

## 2013-09-19 DIAGNOSIS — E871 Hypo-osmolality and hyponatremia: Secondary | ICD-10-CM | POA: Diagnosis not present

## 2013-09-19 DIAGNOSIS — R971 Elevated cancer antigen 125 [CA 125]: Secondary | ICD-10-CM | POA: Diagnosis not present

## 2013-09-19 DIAGNOSIS — N390 Urinary tract infection, site not specified: Secondary | ICD-10-CM | POA: Diagnosis not present

## 2013-09-19 DIAGNOSIS — R42 Dizziness and giddiness: Secondary | ICD-10-CM | POA: Diagnosis not present

## 2013-09-20 DIAGNOSIS — I739 Peripheral vascular disease, unspecified: Secondary | ICD-10-CM | POA: Diagnosis not present

## 2013-09-20 DIAGNOSIS — L608 Other nail disorders: Secondary | ICD-10-CM | POA: Diagnosis not present

## 2013-10-16 ENCOUNTER — Emergency Department (HOSPITAL_COMMUNITY)
Admission: EM | Admit: 2013-10-16 | Discharge: 2013-10-16 | Disposition: A | Discharge status: Home / Self Care | Payer: Medicare Other | Attending: Emergency Medicine | Admitting: Emergency Medicine

## 2013-10-16 ENCOUNTER — Encounter (HOSPITAL_COMMUNITY): Payer: Self-pay | Admitting: Emergency Medicine

## 2013-10-16 ENCOUNTER — Emergency Department (HOSPITAL_COMMUNITY): Payer: Medicare Other

## 2013-10-16 DIAGNOSIS — Z859 Personal history of malignant neoplasm, unspecified: Secondary | ICD-10-CM | POA: Insufficient documentation

## 2013-10-16 DIAGNOSIS — R091 Pleurisy: Secondary | ICD-10-CM | POA: Diagnosis not present

## 2013-10-16 DIAGNOSIS — H9190 Unspecified hearing loss, unspecified ear: Secondary | ICD-10-CM | POA: Diagnosis not present

## 2013-10-16 DIAGNOSIS — M1990 Unspecified osteoarthritis, unspecified site: Secondary | ICD-10-CM | POA: Insufficient documentation

## 2013-10-16 DIAGNOSIS — Z872 Personal history of diseases of the skin and subcutaneous tissue: Secondary | ICD-10-CM | POA: Insufficient documentation

## 2013-10-16 DIAGNOSIS — G4700 Insomnia, unspecified: Secondary | ICD-10-CM | POA: Insufficient documentation

## 2013-10-16 DIAGNOSIS — Z8781 Personal history of (healed) traumatic fracture: Secondary | ICD-10-CM | POA: Diagnosis not present

## 2013-10-16 DIAGNOSIS — Z87440 Personal history of urinary (tract) infections: Secondary | ICD-10-CM | POA: Diagnosis not present

## 2013-10-16 DIAGNOSIS — Z853 Personal history of malignant neoplasm of breast: Secondary | ICD-10-CM | POA: Insufficient documentation

## 2013-10-16 DIAGNOSIS — I2510 Atherosclerotic heart disease of native coronary artery without angina pectoris: Secondary | ICD-10-CM | POA: Insufficient documentation

## 2013-10-16 DIAGNOSIS — Z79899 Other long term (current) drug therapy: Secondary | ICD-10-CM | POA: Diagnosis not present

## 2013-10-16 DIAGNOSIS — Z7982 Long term (current) use of aspirin: Secondary | ICD-10-CM | POA: Diagnosis not present

## 2013-10-16 DIAGNOSIS — M810 Age-related osteoporosis without current pathological fracture: Secondary | ICD-10-CM | POA: Insufficient documentation

## 2013-10-16 DIAGNOSIS — Z8619 Personal history of other infectious and parasitic diseases: Secondary | ICD-10-CM | POA: Diagnosis not present

## 2013-10-16 DIAGNOSIS — C4492 Squamous cell carcinoma of skin, unspecified: Secondary | ICD-10-CM | POA: Diagnosis not present

## 2013-10-16 DIAGNOSIS — R0602 Shortness of breath: Secondary | ICD-10-CM | POA: Diagnosis not present

## 2013-10-16 DIAGNOSIS — R0789 Other chest pain: Secondary | ICD-10-CM | POA: Diagnosis not present

## 2013-10-16 DIAGNOSIS — R05 Cough: Secondary | ICD-10-CM | POA: Diagnosis not present

## 2013-10-16 LAB — CBC WITH DIFFERENTIAL/PLATELET
BASOS ABS: 0 10*3/uL (ref 0.0–0.1)
BASOS PCT: 0 % (ref 0–1)
Eosinophils Absolute: 0.1 10*3/uL (ref 0.0–0.7)
Eosinophils Relative: 1 % (ref 0–5)
HEMATOCRIT: 31.3 % — AB (ref 36.0–46.0)
Hemoglobin: 10.3 g/dL — ABNORMAL LOW (ref 12.0–15.0)
Lymphocytes Relative: 15 % (ref 12–46)
Lymphs Abs: 1.7 10*3/uL (ref 0.7–4.0)
MCH: 28.1 pg (ref 26.0–34.0)
MCHC: 32.9 g/dL (ref 30.0–36.0)
MCV: 85.5 fL (ref 78.0–100.0)
MONO ABS: 1.6 10*3/uL — AB (ref 0.1–1.0)
Monocytes Relative: 14 % — ABNORMAL HIGH (ref 3–12)
NEUTROS ABS: 8.1 10*3/uL — AB (ref 1.7–7.7)
Neutrophils Relative %: 70 % (ref 43–77)
PLATELETS: 295 10*3/uL (ref 150–400)
RBC: 3.66 MIL/uL — ABNORMAL LOW (ref 3.87–5.11)
RDW: 14.3 % (ref 11.5–15.5)
WBC: 11.6 10*3/uL — ABNORMAL HIGH (ref 4.0–10.5)

## 2013-10-16 LAB — COMPREHENSIVE METABOLIC PANEL
ALT: 15 U/L (ref 0–35)
ANION GAP: 14 (ref 5–15)
AST: 21 U/L (ref 0–37)
Albumin: 2.9 g/dL — ABNORMAL LOW (ref 3.5–5.2)
Alkaline Phosphatase: 93 U/L (ref 39–117)
BILIRUBIN TOTAL: 0.4 mg/dL (ref 0.3–1.2)
BUN: 10 mg/dL (ref 6–23)
CALCIUM: 8.9 mg/dL (ref 8.4–10.5)
CHLORIDE: 91 meq/L — AB (ref 96–112)
CO2: 24 meq/L (ref 19–32)
Creatinine, Ser: 0.56 mg/dL (ref 0.50–1.10)
GFR, EST NON AFRICAN AMERICAN: 80 mL/min — AB (ref >90)
GLUCOSE: 113 mg/dL — AB (ref 70–99)
Potassium: 4 mEq/L (ref 3.7–5.3)
Sodium: 129 mEq/L — ABNORMAL LOW (ref 137–147)
Total Protein: 6.6 g/dL (ref 6.0–8.3)

## 2013-10-16 LAB — PROTIME-INR
INR: 1.1 (ref 0.00–1.49)
Prothrombin Time: 14.2 seconds (ref 11.6–15.2)

## 2013-10-16 LAB — I-STAT TROPONIN, ED: TROPONIN I, POC: 0.01 ng/mL (ref 0.00–0.08)

## 2013-10-16 LAB — D-DIMER, QUANTITATIVE (NOT AT ARMC)

## 2013-10-16 LAB — APTT: aPTT: 34 seconds (ref 24–37)

## 2013-10-16 LAB — LIPASE, BLOOD: LIPASE: 16 U/L (ref 11–59)

## 2013-10-16 LAB — PRO B NATRIURETIC PEPTIDE: PRO B NATRI PEPTIDE: 1099 pg/mL — AB (ref 0–450)

## 2013-10-16 MED ORDER — FUROSEMIDE 20 MG PO TABS: 20.0000 mg | ORAL_TABLET | Freq: Every day | ORAL | Status: DC

## 2013-10-16 NOTE — ED Notes (Signed)
Returned from xray

## 2013-10-16 NOTE — Discharge Instructions (Signed)

## 2013-10-16 NOTE — ED Notes (Signed)
Pt had CP yesterday, but did not seek treatment.  Pt lives at assisted living.  Pt's daughter came to visit her today and pt mentioned the CP to daughter.  Pt's daughter wanted pt to be seen today.  Pt denies any CP today.  Pt's only complaint is a dry non productive cough.

## 2013-10-16 NOTE — ED Provider Notes (Signed)
CSN: [id number]     Arrival date & time 10/16/13  1108 History   First MD Initiated Contact with Patient 10/16/13 1110     Chief Complaint  Patient presents with  . Cough     (Consider location/radiation/quality/duration/timing/severity/associated sxs/prior Treatment) HPI The patient reports that she had some chest pain yesterday. She reports it was coming and going. The quality was sharp and radiating through her left chest. It seemed to start below her breast on the left side and then with deep breaths which shoot up towards her left shoulder. She reports it might last for minutes at a time and then improve for a period of time. Patient reports she's been mildly short of breath. However she identifies a cough for several months, dry and fairly mild. As well she's noticed some mild shortness of breath made it worse by exertion. The chest pain component that was sharp and in the left chest developed yesterday. No associated syncope or near syncope. No associated fevers or chills. No associated lower extremity swelling or calf pain. No vomiting or diarrhea.  Past Medical History  Diagnosis Date  . Cancer   . Macular degeneration   . HOH (hard of hearing)   . Vaginitis and vulvovaginitis 02/29/2012  . Abnormalities of the hair 02/29/2012  . Spinal stenosis, lumbar region, with neurogenic claudication 11/02/2011  . Trigger finger (acquired) 11/02/2011  . Right bundle branch block 08/30/2011  . Edema 05/07/2011  . Contact dermatitis and other eczema, due to unspecified cause 05/07/2011  . Hypopotassemia 03/26/2011  . Candidiasis of skin and nails 03/19/2011  . Hyposmolality and/or hyponatremia 03/19/2011  . Acute posthemorrhagic anemia 03/19/2011  . Diverticulosis of colon (without mention of hemorrhage) 03/19/2011  . Unspecified constipation 03/19/2011  . Pain in joint, pelvic region and thigh 03/19/2011  . Lumbago 03/19/2011  . Spasm of muscle 03/19/2011  . Osteoporosis, unspecified  03/19/2011  . Insomnia, unspecified 03/19/2011  . Other malaise and fatigue 03/19/2011  . Impaired fasting glucose 03/19/2011  . Coronary atherosclerosis of native coronary artery 03/18/2011  . Hypotension, unspecified 03/18/2011  . Closed fracture of base of neck of femur 03/18/2011  . Malignant neoplasm of breast (female), unspecified site 10/27/2004  . Arthritis   . Shortness of breath    Past Surgical History  Procedure Laterality Date  . Cataract extraction  2010    bialteral  . Hip pinning,cannulated  03/08/2011    Procedure: CANNULATED HIP PINNING;  Surgeon: Johnn Hai, MD;  Location: [facility]S;  Service: Orthopedics;  Laterality: Right;  . Eye surgery      cataract  . Fracture surgery Left 09/1980    ankle  . Breast surgery Bilateral 2005-10/27/2004    lumpectomy- Streck,MD  . Squamous cell carcinoma excision Right 02/04/2011    forearm- Taffeen, MD  . Orif hip fracture Right 03/08/2011    Bean,MD  . Skin lesion excision Right 02/04/2011    Abdomen lesion spongiotic dermatitis-Taffeen, MD  . Skin cancer excision Left 10/12/2012    lower leg Dr. [place]   Family History  Problem Relation Age of Onset  . Heart disease Mother   . Cancer Father   . Emphysema Brother   . Alzheimer's disease Brother    History  Substance Use Topics  . Smoking status: Never Smoker   . Smokeless tobacco: Never Used  . Alcohol Use: 0.0 oz/week    0 Glasses of wine per week     Comment: occasional glass   OB History  Grav Para Term Preterm Abortions TAB SAB Ect Mult Living                 Review of Systems  10 Systems reviewed and are negative for acute change except as noted in the HPI.   Allergies  Macrodantin and Septra  Home Medications   Prior to Admission medications   Medication Sig Start Date End Date Taking? Authorizing Provider  acetaminophen (TYLENOL) 500 MG tablet Take 1,000 mg by mouth every 6 (six) hours as needed for moderate pain.    Yes Historical  Provider, MD  aspirin EC 81 MG tablet Take 81 mg by mouth daily.   Yes Historical Provider, MD  Bilberry 1000 MG CAPS Take 1,000 mg by mouth daily.    Yes Historical Provider, MD  Calcium Carbonate-Vitamin D (CALCIUM 600/VITAMIN D PO) Take by mouth 2 (two) times daily.   Yes Historical Provider, MD  Calcium Polycarbophil (FIBERCON PO) Take 1 tablet by mouth daily.    Yes Historical Provider, MD  cholecalciferol (VITAMIN D) 400 UNITS TABS Take 400 Units by mouth daily.    Yes Historical Provider, MD  fish oil-omega-3 fatty acids 1000 MG capsule Take 1 g by mouth daily.    Yes Historical Provider, MD  Glucosamine-Vitamin D (GLUCOSAMINE PLUS VITAMIN D) 1500-200 MG-UNIT TABS Take 1 tablet by mouth 2 (two) times daily. Take 1 tablet twice daily for joint health   Yes Historical Provider, MD  Multiple Vitamins-Minerals (PRESERVISION/LUTEIN PO) Take 1 capsule by mouth 2 (two) times daily.   Yes Historical Provider, MD  Multiple Vitamins-Minerals (STRESS FORMULA 500/ZINC) TABS Take 1 tablet by mouth daily.   Yes Historical Provider, MD  Probiotic Product (RESTORA) CAPS Take 1 capsule by mouth daily.   Yes Historical Provider, MD  venlafaxine (EFFEXOR) 75 MG tablet Take 75 mg by mouth daily.   Yes Historical Provider, MD  zolpidem (AMBIEN) 5 MG tablet Take 2.5 mg by mouth at bedtime as needed for sleep.   Yes Historical Provider, MD  furosemide (LASIX) 20 MG tablet Take 1 tablet (20 mg total) by mouth daily. Take a tablet daily for 3 days, then recheck with your doctor before resuming any additional doses 10/16/13   Charlesetta Shanks, MD   BP 128/59  Pulse 81  Temp(Src) 99.2 F (37.3 C) (Oral)  Resp 20  SpO2 97% Physical Exam  Constitutional: She is oriented to person, place, and time. She appears well-developed and well-nourished.  HENT:  Head: Normocephalic and atraumatic.  Eyes: EOM are normal. Pupils are equal, round, and reactive to light.  Neck: Neck supple.  Cardiovascular: Normal rate, regular  rhythm, normal heart sounds and intact distal pulses.   Pulmonary/Chest: Effort normal and breath sounds normal.  Patient identifies some localized chest wall discomfort approximately at the lower ribs about T11 or 10th rib just slightly medial to the nipple line. There is no significant palpable abnormality in this region, patient does not seem significantly tender to palpation. There is no rash on the chest wall. She does have some actinic keratosis under the breast but no vesicular lesions or erythema.  Abdominal: Soft. Bowel sounds are normal. She exhibits no distension. There is no tenderness.  Musculoskeletal: Normal range of motion. She exhibits no edema and no tenderness.  Neurological: She is alert and oriented to person, place, and time. She has normal strength. Coordination normal. GCS eye subscore is 4. GCS verbal subscore is 5. GCS motor subscore is 6.  Skin: Skin is warm, dry  and intact.  Psychiatric: She has a normal mood and affect.    ED Course  Procedures (including critical care time) Labs Review Labs Reviewed  COMPREHENSIVE METABOLIC PANEL - Abnormal; Notable for the following:    Sodium 129 (*)    Chloride 91 (*)    Glucose, Bld 113 (*)    Albumin 2.9 (*)    GFR calc non Af Amer 80 (*)    All other components within normal limits  CBC WITH DIFFERENTIAL - Abnormal; Notable for the following:    WBC 11.6 (*)    RBC 3.66 (*)    Hemoglobin 10.3 (*)    HCT 31.3 (*)    Neutro Abs 8.1 (*)    Monocytes Relative 14 (*)    Monocytes Absolute 1.6 (*)    All other components within normal limits  PRO B NATRIURETIC PEPTIDE - Abnormal; Notable for the following:    Pro B Natriuretic peptide (BNP) 1099.0 (*)    All other components within normal limits  LIPASE, BLOOD  APTT  PROTIME-INR  D-DIMER, QUANTITATIVE  I-STAT TROPOININ, ED    Imaging Review Dg Chest 2 View  10/16/2013   CLINICAL DATA:  Cough.  Breast surgery.  Squamous cell carcinoma.  EXAM: CHEST  2 VIEW   COMPARISON:  02/25/2013  FINDINGS: Moderate cardiomegaly. Hyperaeration. Pleural parenchymal scarring at the lung apices is stable. Right upper lobe nodular density adjacent to the right axilla is stable. No pneumothorax. Normal vascularity. Severe lower thoracic compression fracture is stable.  IMPRESSION: Chronic changes.  No active cardiopulmonary disease.   Electronically Signed   By: Maryclare Bean M.D.   On: 10/16/2013 13:20     EKG Interpretation   Date/Time:  Tuesday October 16 2013 11:10:59 EDT Ventricular Rate:  79 PR Interval:  159 QRS Duration: 125 QT Interval:  365 QTC Calculation: 418 R Axis:   71 Text Interpretation:  Sinus rhythm Right bundle branch block Borderline ST  elevation, lateral leads old RBBB no significant change. Confirmed by  Johnney Killian, MD, Jeannie Done [phone]) on 10/16/2013 4:06:17 PM      MDM   Final diagnoses:  Pleurisy   This point in time the patient has a pleuritic quality to her pain as well as a chest wall component. The workup that time is of low probability for PE with a negative d-dimer and no lower extremity symptoms. Patient describes a fairly chronic degree of exertional dyspnea and mild cough. Today the chest x-ray does not show any acute evidence of pneumonia and the symptoms of cough have been extremely indolent. The patient has not had any chest pain today nor other symptoms. She has had a mild degree of CHF in the past. Today's BNP is only mildly elevated. Without chest x-ray findings nor lower extremity swelling, this appears to be fairly mild. I will have her take 3 days of 20 mg of Lasix and follow up with her physician this week for reassessment. Patient is counseled she needs to return should she develop any other symptoms such as nausea diaphoresis general weakness or recurrence of chest pain.    Charlesetta Shanks, MD 10/16/13 1630

## 2013-10-16 NOTE — ED Notes (Signed)
To x-ray

## 2013-10-19 ENCOUNTER — Ambulatory Visit (INDEPENDENT_AMBULATORY_CARE_PROVIDER_SITE_OTHER): Payer: Medicare Other | Admitting: Internal Medicine

## 2013-10-19 ENCOUNTER — Encounter: Payer: Self-pay | Admitting: Internal Medicine

## 2013-10-19 VITALS — BP 152/80 | HR 89 | Temp 97.4°F | Resp 20 | Ht 61.0 in | Wt 130.6 lb

## 2013-10-19 DIAGNOSIS — E871 Hypo-osmolality and hyponatremia: Secondary | ICD-10-CM | POA: Diagnosis not present

## 2013-10-19 DIAGNOSIS — R0781 Pleurodynia: Secondary | ICD-10-CM | POA: Diagnosis not present

## 2013-10-19 DIAGNOSIS — R42 Dizziness and giddiness: Secondary | ICD-10-CM

## 2013-10-19 NOTE — Patient Instructions (Signed)
Let us know if you have recurrence or worsening of your chest pains.   If that is the case, we may need to put you on the lasix (water pill) as needed for weight gain and fluid buildup with daily weights.

## 2013-10-19 NOTE — Progress Notes (Signed)
Patient ID: Cassandra Glenn, female   DOB: 1921/01/05, 80 y.o.   MRN: [id number]   Location:  [facility]  Code Status: full code  Allergies  Allergen Reactions  . Macrodantin [Nitrofurantoin Macrocrystal] Other (See Comments)    unknown  . Septra [Sulfamethoxazole-Trimethoprim] Other (See Comments)    unknown    Chief Complaint  Patient presents with  . Hospitalization Follow-up    HPI: Patient is a 80 y.o. seen in the office today for hospital follow up for chest pain.  Per review of records:  She experienced chest pain on 10/15/13.  She told her daughter about it 10/16/13 and she brought her to the ED.  Chest pain was sharp to her left chest, she has a cough with mild dyspnea.  She had no fevers, chills, sweats, swelling of her ankles or calf pain, no vomiting or diarrhea.  She had chest tenderness at T9/10 ribs anterior chest on left.  Pain is somewhat pleuritic.  She was given a 3 day course of lasix 20mg , but was only able to take 2 days.  Her chest xray showed no acute cardiopulmonary diseases only chronic changes.  She had mild anemia, wbc 11 and Na extra low at 129 (appears to run mid 130s).    Chest pain started in left chest and up through neck.  Was worse with breathing.  Pt felt it was pleurisy.  Wanted to make sure it was not a heart attack.  Was told fluid around her lungs was causing it--told pleurisy.  Took her lasix wed and Thursday that ED gave her, but did not take third pill.  She got goofy in the head after two.    Does not feel pressure at baseline, and hardly feels the tightness even with deep breaths any longer.    Review of Systems:  Review of Systems  Constitutional: Negative for fever and malaise/fatigue.  Respiratory: Positive for cough and shortness of breath. Negative for sputum production and wheezing.   Cardiovascular: Positive for chest pain. Negative for palpitations, orthopnea, leg swelling and PND.    Gastrointestinal: Negative for heartburn, abdominal pain and constipation.  Genitourinary: Positive for frequency. Negative for dysuria.  Musculoskeletal: Positive for myalgias. Negative for falls.  Neurological: Positive for dizziness. Negative for weakness.  Endo/Heme/Allergies: Bruises/bleeds easily.     Past Medical History  Diagnosis Date  . Cancer   . Macular degeneration   . HOH (hard of hearing)   . Vaginitis and vulvovaginitis 02/29/2012  . Abnormalities of the hair 02/29/2012  . Spinal stenosis, lumbar region, with neurogenic claudication 11/02/2011  . Trigger finger (acquired) 11/02/2011  . Right bundle branch block 08/30/2011  . Edema 05/07/2011  . Contact dermatitis and other eczema, due to unspecified cause 05/07/2011  . Hypopotassemia 03/26/2011  . Candidiasis of skin and nails 03/19/2011  . Hyposmolality and/or hyponatremia 03/19/2011  . Acute posthemorrhagic anemia 03/19/2011  . Diverticulosis of colon (without mention of hemorrhage) 03/19/2011  . Unspecified constipation 03/19/2011  . Pain in joint, pelvic region and thigh 03/19/2011  . Lumbago 03/19/2011  . Spasm of muscle 03/19/2011  . Osteoporosis, unspecified 03/19/2011  . Insomnia, unspecified 03/19/2011  . Other malaise and fatigue 03/19/2011  . Impaired fasting glucose 03/19/2011  . Coronary atherosclerosis of native coronary artery 03/18/2011  . Hypotension, unspecified 03/18/2011  . Closed fracture of base of neck of femur 03/18/2011  . Malignant neoplasm of breast (female), unspecified site 10/27/2004  . Arthritis   .  Shortness of breath     Past Surgical History  Procedure Laterality Date  . Cataract extraction  2010    bialteral  . Hip pinning,cannulated  03/08/2011    Procedure: CANNULATED HIP PINNING;  Surgeon: Johnn Hai, MD;  Location: [facility];  Service: Orthopedics;  Laterality: Right;  . Eye surgery      cataract  . Fracture surgery Left 09/1980    ankle  . Breast surgery  Bilateral 2005-10/27/2004    lumpectomy- Streck,MD  . Squamous cell carcinoma excision Right 02/04/2011    forearm- Taffeen, MD  . Orif hip fracture Right 03/08/2011    Bean,MD  . Skin lesion excision Right 02/04/2011    Abdomen lesion spongiotic dermatitis-Taffeen, MD  . Skin cancer excision Left 10/12/2012    lower leg Dr. Syble Creek    Social History:   reports that she has never smoked. She has never used smokeless tobacco. She reports that she drinks alcohol. She reports that she does not use illicit drugs.  Family History  Problem Relation Age of Onset  . Heart disease Mother   . Cancer Father   . Emphysema Brother   . Alzheimer's disease Brother     Medications: Patient's Medications  New Prescriptions   No medications on file  Previous Medications   ACETAMINOPHEN (TYLENOL) 500 MG TABLET    Take 1,000 mg by mouth every 6 (six) hours as needed for moderate pain.    ASPIRIN EC 81 MG TABLET    Take 81 mg by mouth daily.   BILBERRY 1000 MG CAPS    Take 1,000 mg by mouth daily.    CALCIUM CARBONATE-VITAMIN D (CALCIUM 600/VITAMIN D PO)    Take by mouth 2 (two) times daily.   CALCIUM POLYCARBOPHIL (FIBERCON PO)    Take 1 tablet by mouth daily.    CHOLECALCIFEROL (VITAMIN D) 400 UNITS TABS    Take 400 Units by mouth daily.    FISH OIL-OMEGA-3 FATTY ACIDS 1000 MG CAPSULE    Take 1 g by mouth daily.    FUROSEMIDE (LASIX) 20 MG TABLET    Take 1 tablet (20 mg total) by mouth daily. Take a tablet daily for 3 days, then recheck with your doctor before resuming any additional doses   GLUCOSAMINE-VITAMIN D (GLUCOSAMINE PLUS VITAMIN D) 1500-200 MG-UNIT TABS    Take 1 tablet by mouth 2 (two) times daily. Take 1 tablet twice daily for joint health   MULTIPLE VITAMINS-MINERALS (PRESERVISION/LUTEIN PO)    Take 1 capsule by mouth 2 (two) times daily.   MULTIPLE VITAMINS-MINERALS (STRESS FORMULA 500/ZINC) TABS    Take 1 tablet by mouth daily.   PROBIOTIC PRODUCT (RESTORA) CAPS    Take 1 capsule by  mouth daily.   VENLAFAXINE (EFFEXOR) 75 MG TABLET    Take 75 mg by mouth daily.   ZOLPIDEM (AMBIEN) 5 MG TABLET    Take 2.5 mg by mouth at bedtime as needed for sleep.  Modified Medications   No medications on file  Discontinued Medications   No medications on file     Physical Exam: Filed Vitals:   10/19/13 0935  BP: 152/80  Pulse: 89  Temp: 97.4 F (36.3 C)  TempSrc: Oral  Resp: 20  Height: 5\' 1"  (1.549 m)  Weight: 130 lb 9.6 oz (59.24 kg)  SpO2: 97%  Physical Exam  Constitutional: She is oriented to person, place, and time. She appears well-developed and well-nourished. No distress.  HENT:  Head: Normocephalic and atraumatic.  Cardiovascular:  Normal rate, regular rhythm, normal heart sounds and intact distal pulses.   Pulmonary/Chest: Effort normal and breath sounds normal. No respiratory distress. She has no wheezes. She has no rales. She exhibits tenderness.  Abdominal: Soft. Bowel sounds are normal.  Musculoskeletal: Normal range of motion.  Walks with cane  Neurological: She is alert and oriented to person, place, and time.  Skin: There is pallor.  Psychiatric: She has a normal mood and affect.    Labs reviewed: Basic Metabolic Panel:  Recent Labs  11/23/12 1121 11/23/12 1705  02/25/13 0350 02/25/13 1200 02/26/13 0704 04/16/13 0802 04/19/13 05/07/13 10/16/13 1143  NA 104* 100*  < > 134* 135* 133* 134* 131* 135* 129*  K 4.2 3.9  < > 4.1 4.3 3.7 3.7 4.5 4.6 4.0  CL 74* 71*  < > 98 97 99 95*  --   --  91*  CO2  --  20  < > 23 24 26   --   --   --  24  GLUCOSE 130* 109*  < > 161* 128* 95 122*  --   --  113*  BUN 6 6  < > 17 15 11 7 11 14 10   CREATININE 0.60 0.44*  < > 0.60 0.59 0.61 0.60 0.6 0.6 0.56  CALCIUM  --  8.3*  < > 8.8 8.7 8.4  --   --   --  8.9  MG  --   --   --   --  2.1  --   --   --   --   --   PHOS  --   --   --   --  2.6  --   --   --   --   --   TSH  --  0.911  --   --   --   --   --   --   --   --   < > = values in this interval not  displayed. Liver Function Tests:  Recent Labs  02/25/13 1200 02/26/13 0704 04/19/13 10/16/13 1143  AST 28 20 25 21   ALT 21 16 19 15   ALKPHOS 84 66 74 93  BILITOT 0.5 0.3  --  0.4  PROT 6.8 5.8*  --  6.6  ALBUMIN 3.5 2.9*  --  2.9*    Recent Labs  11/23/12 1705 02/25/13 0350 10/16/13 1143  LIPASE 27 22 16    No results found for this basename: AMMONIA,  in the last 8760 hours CBC:  Recent Labs  02/25/13 0350  02/25/13 1200 02/26/13 0704  05/07/13 08/02/13 10/16/13 1143  WBC 10.2  < > 8.4 5.0  < > 8.7 7.5 11.6*  NEUTROABS 8.3*  --  7.2  --   --   --   --  8.1*  HGB 11.5*  < > 12.0 10.4*  < > 12.3 12.0 10.3*  HCT 34.2*  < > 36.3 32.0*  < > 37 36 31.3*  MCV 89.5  < > 89.4 90.1  --   --   --  85.5  PLT 213  < > 211 189  < > 262 233 295  < > = values in this interval not displayed.  Past Procedures: Reviewed from ED visit EKG and CXR  Assessment/Plan 1. Pleuritic chest pain -felt to be pleurisy in ED -?mild chf component--weight increased from mid summer, but she thinks it's due to eating desserts -seems much better after two days of lasix 20mg  but now  lightheaded after taking it so will f/u bmp for hydration level, Na and K - Basic metabolic panel -if recurs, advised to let us know--may need to further eval cardiac function and put on as needed diuretic with daily weights  2. Hyponatremia -was worse at ED, will f/u now, not restricting her na or fluid intake - Basic metabolic panel  3. Intermittent lightheadedness -suspect due to diuretic use for 2 days--was up urinating hourly from 2am onward, she says (has had some of this but her daughter thinks it had improved before the lasix)  Labs/tests ordered:   Orders Placed This Encounter  Procedures  . Basic metabolic panel   Next appt:  Keep appt with Dr. Nyoka Cowden 11/17  Lavonte Palos L. Nahzir Pohle, D.O. [facility] [address], [place] [address] Cell Phone (Mon-Fri  8am-5pm):  [phone] On Call:  [phone] & follow prompts after 5pm & weekends Office Phone:  [phone] Office Fax:  [phone]

## 2013-10-20 LAB — BASIC METABOLIC PANEL
BUN/Creatinine Ratio: 18 (ref 11–26)
BUN: 10 mg/dL (ref 10–36)
CO2: 24 mmol/L (ref 18–29)
Calcium: 9.5 mg/dL (ref 8.7–10.3)
Chloride: 91 mmol/L — ABNORMAL LOW (ref 97–108)
Creatinine, Ser: 0.57 mg/dL (ref 0.57–1.00)
GFR calc Af Amer: 94 mL/min/{1.73_m2} (ref >59)
GFR calc non Af Amer: 81 mL/min/{1.73_m2} (ref >59)
Glucose: 101 mg/dL — ABNORMAL HIGH (ref 65–99)
Potassium: 4.4 mmol/L (ref 3.5–5.2)
Sodium: 132 mmol/L — ABNORMAL LOW (ref 134–144)

## 2013-10-27 DIAGNOSIS — Z23 Encounter for immunization: Secondary | ICD-10-CM | POA: Diagnosis not present

## 2013-10-31 ENCOUNTER — Telehealth: Payer: Self-pay | Admitting: *Deleted

## 2013-10-31 NOTE — Telephone Encounter (Signed)
Cassandra Glenn, daughter called and stated that patient was having the pleuritic pain again and  wants to know if patient should go ahead and take a furosemide pill in the morning. Patient has an appointment with Dr. Mariea Clonts tomorrow at 3:15 but daughter wants to know if patient should take a Furosemide pill in the morning before the appointment or should she just wait until after she is seen. Please Advise.

## 2013-11-01 ENCOUNTER — Encounter: Payer: Self-pay | Admitting: Internal Medicine

## 2013-11-01 ENCOUNTER — Ambulatory Visit (INDEPENDENT_AMBULATORY_CARE_PROVIDER_SITE_OTHER): Payer: Medicare Other | Admitting: Internal Medicine

## 2013-11-01 VITALS — BP 150/80 | HR 96 | Temp 98.0°F | Resp 20 | Ht 61.0 in | Wt 128.0 lb

## 2013-11-01 DIAGNOSIS — R0781 Pleurodynia: Secondary | ICD-10-CM

## 2013-11-01 DIAGNOSIS — K589 Irritable bowel syndrome without diarrhea: Secondary | ICD-10-CM | POA: Diagnosis not present

## 2013-11-01 DIAGNOSIS — I5032 Chronic diastolic (congestive) heart failure: Secondary | ICD-10-CM

## 2013-11-01 NOTE — Telephone Encounter (Signed)
I called and discussed this with Cassandra Glenn. Patient is to be seen by Dr. Mariea Clonts today. Family felt like she was doing okay when she went to the nursing home to see her.

## 2013-11-01 NOTE — Progress Notes (Signed)
Patient ID: Cassandra Glenn, female   DOB: [birth year], 80 y.o.   MRN: [id number]   Location:  [facility]  Code Status: full code  Allergies  Allergen Reactions  . Macrodantin [Nitrofurantoin Macrocrystal] Other (See Comments)    unknown  . Septra [Sulfamethoxazole-Trimethoprim] Other (See Comments)    unknown    Chief Complaint  Patient presents with  . Acute Visit    pleuritic pain    HPI: Patient is a 80 y.o. white female seen in the office today for acute visit due to recurrence of pleuritic chest pain.  Pain returned for 15-20 minutes yesterday.  It really hurt to breathe.  Called Terese at [facility] and she called her daughter.  She did try the wedge pillow last night.  Does have some shortness of breath.  Has had some loose stool.  Her daughter suggested increasing her fibercon to bulk her stools--only using one tablet.  She has only had this one episode since she was last seen about this.  We discussed possible causes of the recurrent pleuritis.  Reviewing her records, there was some concern for malignancy in the past.  I suggested we do a f/u CT chest to reassess this.  She has no edema.  She has not gained weight.  She had taken the 3 day course of lasix and stopped.  Her previous ED visit, she had been told she had pleurisy and was treated with the lasix for it.  Of note, she's had prior XRT to her left chest for breast cancer and had notable nodular densities and scarring on the last CT chest that was done.  We discussed the possibility that this in itself could be causing her symptoms.  Did stool samples with Dr. Paulita Fujita in 2012 and has had difficulties with diarrhea intermittently for years.  Review of Systems:  Review of Systems  Constitutional: Positive for weight loss. Negative for fever and malaise/fatigue.       Lost since last appt  HENT: Positive for hearing loss. Negative for congestion.   Eyes: Positive for blurred vision.    Respiratory: Positive for cough and shortness of breath. Negative for wheezing.   Cardiovascular: Positive for chest pain and orthopnea. Negative for palpitations, leg swelling and PND.  Gastrointestinal: Negative for abdominal pain.  Genitourinary: Negative for dysuria.  Musculoskeletal: Negative for falls and myalgias.  Skin: Negative for rash.  Neurological: Negative for dizziness, loss of consciousness and weakness.  Endo/Heme/Allergies: Bruises/bleeds easily.  Psychiatric/Behavioral: Negative for depression and memory loss.     Past Medical History  Diagnosis Date  . Cancer   . Macular degeneration   . HOH (hard of hearing)   . Vaginitis and vulvovaginitis 02/29/2012  . Abnormalities of the hair 02/29/2012  . Spinal stenosis, lumbar region, with neurogenic claudication 11/02/2011  . Trigger finger (acquired) 11/02/2011  . Right bundle branch block 08/30/2011  . Edema 05/07/2011  . Contact dermatitis and other eczema, due to unspecified cause 05/07/2011  . Hypopotassemia 03/26/2011  . Candidiasis of skin and nails 03/19/2011  . Hyposmolality and/or hyponatremia 03/19/2011  . Acute posthemorrhagic anemia 03/19/2011  . Diverticulosis of colon (without mention of hemorrhage) 03/19/2011  . Unspecified constipation 03/19/2011  . Pain in joint, pelvic region and thigh 03/19/2011  . Lumbago 03/19/2011  . Spasm of muscle 03/19/2011  . Osteoporosis, unspecified 03/19/2011  . Insomnia, unspecified 03/19/2011  . Other malaise and fatigue 03/19/2011  . Impaired fasting glucose 03/19/2011  . Coronary  atherosclerosis of native coronary artery 03/18/2011  . Hypotension, unspecified 03/18/2011  . Closed fracture of base of neck of femur 03/18/2011  . Malignant neoplasm of breast (female), unspecified site 10/27/2004  . Arthritis   . Shortness of breath     Past Surgical History  Procedure Laterality Date  . Cataract extraction  2010    bialteral  . Hip pinning,cannulated   03/08/2011    Procedure: CANNULATED HIP PINNING;  Surgeon: Johnn Hai, MD;  Location: [facility];  Service: Orthopedics;  Laterality: Right;  . Eye surgery      cataract  . Fracture surgery Left 09/1980    ankle  . Breast surgery Bilateral 2005-10/27/2004    lumpectomy- Streck,MD  . Squamous cell carcinoma excision Right 02/04/2011    forearm- Taffeen, MD  . Orif hip fracture Right 03/08/2011    Bean,MD  . Skin lesion excision Right 02/04/2011    Abdomen lesion spongiotic dermatitis-Taffeen, MD  . Skin cancer excision Left 10/12/2012    lower leg Dr. Syble Creek    Social History:   reports that she has never smoked. She has never used smokeless tobacco. She reports that she drinks alcohol. She reports that she does not use illicit drugs.  Family History  Problem Relation Age of Onset  . Heart disease Mother   . Cancer Father   . Emphysema Brother   . Alzheimer's disease Brother     Medications: Patient's Medications  New Prescriptions   No medications on file  Previous Medications   ACETAMINOPHEN (TYLENOL) 500 MG TABLET    Take 1,000 mg by mouth every 6 (six) hours as needed for moderate pain.    ASPIRIN EC 81 MG TABLET    Take 81 mg by mouth daily.   BILBERRY 1000 MG CAPS    Take 1,000 mg by mouth daily.    CALCIUM CARBONATE-VITAMIN D (CALCIUM 600/VITAMIN D PO)    Take by mouth 2 (two) times daily.   CALCIUM POLYCARBOPHIL (FIBERCON PO)    Take 1 tablet by mouth daily.    CHOLECALCIFEROL (VITAMIN D) 400 UNITS TABS    Take 400 Units by mouth daily.    FISH OIL-OMEGA-3 FATTY ACIDS 1000 MG CAPSULE    Take 1 g by mouth daily.    GLUCOSAMINE-VITAMIN D (GLUCOSAMINE PLUS VITAMIN D) 1500-200 MG-UNIT TABS    Take 1 tablet by mouth 2 (two) times daily. Take 1 tablet twice daily for joint health   MULTIPLE VITAMINS-MINERALS (PRESERVISION/LUTEIN PO)    Take 1 capsule by mouth 2 (two) times daily.   MULTIPLE VITAMINS-MINERALS (STRESS FORMULA 500/ZINC) TABS    Take 1 tablet by mouth daily.    PROBIOTIC PRODUCT (RESTORA) CAPS    Take 1 capsule by mouth daily.   VENLAFAXINE (EFFEXOR) 75 MG TABLET    Take 75 mg by mouth daily.   ZOLPIDEM (AMBIEN) 5 MG TABLET    Take 2.5 mg by mouth at bedtime as needed for sleep.  Modified Medications   No medications on file  Discontinued Medications   No medications on file     Physical Exam: Filed Vitals:   11/01/13 1551  BP: 150/80  Pulse: 96  Temp: 98 F (36.7 C)  TempSrc: Oral  Resp: 20  Height: 5\' 1"  (1.549 m)  Weight: 128 lb (58.06 kg)  SpO2: 95%  Physical Exam  Constitutional: She is oriented to person, place, and time. She appears well-developed and well-nourished. No distress.  HENT:  HOH  Cardiovascular: Normal rate, regular  rhythm, normal heart sounds and intact distal pulses.   Pulmonary/Chest: Effort normal and breath sounds normal. She has no rales.  Abdominal: Soft. Bowel sounds are normal. She exhibits no distension and no mass. There is no tenderness.  Musculoskeletal: Normal range of motion.  Neurological: She is alert and oriented to person, place, and time.  Skin: Skin is warm and dry. There is pallor.  Psychiatric: She has a normal mood and affect.    Labs reviewed: Basic Metabolic Panel:  Recent Labs  11/23/12 1121 11/23/12 1705  02/25/13 0350 02/25/13 1200 02/26/13 0704 04/16/13 0802  05/07/13 10/16/13 1143 10/19/13 1022  NA 104* 100*  < > 134* 135* 133* 134*  < > 135* 129* 132*  K 4.2 3.9  < > 4.1 4.3 3.7 3.7  < > 4.6 4.0 4.4  CL 74* 71*  < > 98 97 99 95*  --   --  91* 91*  CO2  --  20  < > 23 24 26   --   --   --  24 24  GLUCOSE 130* 109*  < > 161* 128* 95 122*  --   --  113* 101*  BUN 6 6  < > 17 15 11 7   < > 14 10 10   CREATININE 0.60 0.44*  < > 0.60 0.59 0.61 0.60  < > 0.6 0.56 0.57  CALCIUM  --  8.3*  < > 8.8 8.7 8.4  --   --   --  8.9 9.5  MG  --   --   --   --  2.1  --   --   --   --   --   --   PHOS  --   --   --   --  2.6  --   --   --   --   --   --   TSH  --  0.911  --   --   --    --   --   --   --   --   --   < > = values in this interval not displayed. Liver Function Tests:  Recent Labs  02/25/13 1200 02/26/13 0704 04/19/13 10/16/13 1143  AST 28 20 25 21   ALT 21 16 19 15   ALKPHOS 84 66 74 93  BILITOT 0.5 0.3  --  0.4  PROT 6.8 5.8*  --  6.6  ALBUMIN 3.5 2.9*  --  2.9*    Recent Labs  11/23/12 1705 02/25/13 0350 10/16/13 1143  LIPASE 27 22 16    No results found for this basename: AMMONIA,  in the last 8760 hours CBC:  Recent Labs  02/25/13 0350  02/25/13 1200 02/26/13 0704  05/07/13 08/02/13 10/16/13 1143  WBC 10.2  < > 8.4 5.0  < > 8.7 7.5 11.6*  NEUTROABS 8.3*  --  7.2  --   --   --   --  8.1*  HGB 11.5*  < > 12.0 10.4*  < > 12.3 12.0 10.3*  HCT 34.2*  < > 36.3 32.0*  < > 37 36 31.3*  MCV 89.5  < > 89.4 90.1  --   --   --  85.5  PLT 213  < > 211 189  < > 262 233 295  < > = values in this interval not displayed.  Assessment/Plan 1. Pleuritic chest pain - had full workup during first episode to r/o MI--was told she had pleurisy from fluid  build up and told to take lasix -apparently, there has been prior concern for malignancy, but seems this did not ever come to full fruition--will f/u CT chest at this time--she and her daughter are interested in knowing though they would be unlikely to follow through with aggressive treatments at this time due to her age and currently excellent quality of life -also may simply be due to scarring/fibrosis from prior radiation treatments for her breast cancer (was noted on previous CT but mostly on the right) - CT Chest W Contrast; Future - Basic metabolic panel  2. IBS (irritable bowel syndrome) - diarrhea predominant--will double up her fibercon to see if that helps (her dtr thinks she previously took more) - Basic metabolic panel  3. Chronic diastolic CHF (congestive heart failure) - per echo -I see no s/s of acute exacerbation--no edema, no rales, has mild cough, no weight gain - CT Chest W Contrast;  Future - Basic metabolic panel  Labs/tests ordered: Orders Placed This Encounter  Procedures  . CT Chest W Contrast    Standing Status: Future     Number of Occurrences:      Standing Expiration Date: 01/02/2015    Order Specific Question:  Reason for Exam (SYMPTOM  OR DIAGNOSIS REQUIRED)    Answer:  pleuritic chest pain on left side    Order Specific Question:  Preferred imaging location?    Answer:  [facility]  . Basic metabolic panel    Next appt:  F/u with Dr. Nyoka Cowden as planned  Sherrelle Prochazka L. Taraann Olthoff, D.O. [facility] [address], [place] [address] Cell Phone (Mon-Fri 8am-5pm):  [phone] On Call:  [phone] & follow prompts after 5pm & weekends Office Phone:  [phone] Office Fax:  [phone]

## 2013-11-02 LAB — BASIC METABOLIC PANEL
BUN/Creatinine Ratio: 19 (ref 11–26)
BUN: 14 mg/dL (ref 10–36)
CO2: 25 mmol/L (ref 18–29)
Calcium: 9.2 mg/dL (ref 8.7–10.3)
Chloride: 93 mmol/L — ABNORMAL LOW (ref 97–108)
Creatinine, Ser: 0.75 mg/dL (ref 0.57–1.00)
GFR calc Af Amer: 81 mL/min/{1.73_m2} (ref >59)
GFR calc non Af Amer: 70 mL/min/{1.73_m2} (ref >59)
Glucose: 94 mg/dL (ref 65–99)
Potassium: 4.4 mmol/L (ref 3.5–5.2)
Sodium: 133 mmol/L — ABNORMAL LOW (ref 134–144)

## 2013-11-02 NOTE — Telephone Encounter (Signed)
Noted  

## 2013-11-12 ENCOUNTER — Other Ambulatory Visit: Payer: Medicare Other

## 2013-11-13 ENCOUNTER — Ambulatory Visit
Admission: RE | Admit: 2013-11-13 | Discharge: 2013-11-13 | Disposition: A | Discharge status: Home / Self Care | Payer: Medicare Other | Source: Ambulatory Visit | Attending: Internal Medicine | Admitting: Internal Medicine

## 2013-11-13 DIAGNOSIS — R0602 Shortness of breath: Secondary | ICD-10-CM | POA: Diagnosis not present

## 2013-11-13 DIAGNOSIS — I5032 Chronic diastolic (congestive) heart failure: Secondary | ICD-10-CM

## 2013-11-13 DIAGNOSIS — R0781 Pleurodynia: Secondary | ICD-10-CM | POA: Diagnosis not present

## 2013-11-13 DIAGNOSIS — M4854XA Collapsed vertebra, not elsewhere classified, thoracic region, initial encounter for fracture: Secondary | ICD-10-CM | POA: Diagnosis not present

## 2013-11-13 DIAGNOSIS — Z853 Personal history of malignant neoplasm of breast: Secondary | ICD-10-CM | POA: Diagnosis not present

## 2013-11-14 ENCOUNTER — Encounter: Payer: Self-pay | Admitting: *Deleted

## 2013-11-14 ENCOUNTER — Telehealth: Payer: Self-pay | Admitting: *Deleted

## 2013-11-14 NOTE — Telephone Encounter (Signed)
-----   Message from Gayland Curry, DO sent at 11/14/2013 10:42 AM EST ----- No findings of concern on the CT chest.  Also no mention of any fluid in her lungs or pleura.  This is good news.  She does have the chronic changes from her prior radiation treatments.  She is to keep her regular f/u with Dr. Nyoka Cowden.

## 2013-11-15 NOTE — Telephone Encounter (Signed)
Patient called and wanted the results. Results given and she stated that she will keep appointment.

## 2013-11-19 DIAGNOSIS — R971 Elevated cancer antigen 125 [CA 125]: Secondary | ICD-10-CM | POA: Diagnosis not present

## 2013-11-19 DIAGNOSIS — D638 Anemia in other chronic diseases classified elsewhere: Secondary | ICD-10-CM | POA: Diagnosis not present

## 2013-11-19 DIAGNOSIS — R300 Dysuria: Secondary | ICD-10-CM | POA: Diagnosis not present

## 2013-11-19 DIAGNOSIS — R7309 Other abnormal glucose: Secondary | ICD-10-CM | POA: Diagnosis not present

## 2013-11-19 DIAGNOSIS — Z01419 Encounter for gynecological examination (general) (routine) without abnormal findings: Secondary | ICD-10-CM | POA: Diagnosis not present

## 2013-11-19 DIAGNOSIS — R3915 Urgency of urination: Secondary | ICD-10-CM | POA: Diagnosis not present

## 2013-11-19 LAB — HEPATIC FUNCTION PANEL
ALT: 16 U/L (ref 7–35)
AST: 20 U/L (ref 13–35)
Alkaline Phosphatase: 97 U/L (ref 25–125)
Bilirubin, Total: 0.4 mg/dL

## 2013-11-19 LAB — BASIC METABOLIC PANEL
BUN: 13 mg/dL (ref 4–21)
Creatinine: 0.7 mg/dL (ref 0.5–1.1)
Glucose: 90 mg/dL
Potassium: 4.5 mmol/L (ref 3.4–5.3)
Sodium: 132 mmol/L — AB (ref 137–147)

## 2013-11-19 LAB — CBC AND DIFFERENTIAL
HEMATOCRIT: 37 % (ref 36–46)
HEMOGLOBIN: 12.1 g/dL (ref 12.0–16.0)
Platelets: 384 10*3/uL (ref 150–399)
WBC: 9 10*3/mL

## 2013-11-26 ENCOUNTER — Other Ambulatory Visit (HOSPITAL_COMMUNITY): Payer: Self-pay | Admitting: Obstetrics and Gynecology

## 2013-11-26 DIAGNOSIS — R971 Elevated cancer antigen 125 [CA 125]: Secondary | ICD-10-CM

## 2013-11-27 ENCOUNTER — Ambulatory Visit (HOSPITAL_COMMUNITY)
Admission: RE | Admit: 2013-11-27 | Discharge: 2013-11-27 | Disposition: A | Discharge status: Home / Self Care | Payer: Medicare Other | Source: Ambulatory Visit | Attending: Obstetrics and Gynecology | Admitting: Obstetrics and Gynecology

## 2013-11-27 ENCOUNTER — Other Ambulatory Visit (HOSPITAL_COMMUNITY): Payer: Self-pay | Admitting: Obstetrics and Gynecology

## 2013-11-27 ENCOUNTER — Encounter: Payer: Self-pay | Admitting: Internal Medicine

## 2013-11-27 ENCOUNTER — Non-Acute Institutional Stay: Payer: Medicare Other | Admitting: Internal Medicine

## 2013-11-27 VITALS — BP 144/74 | HR 72 | Wt 128.0 lb

## 2013-11-27 DIAGNOSIS — D692 Other nonthrombocytopenic purpura: Secondary | ICD-10-CM

## 2013-11-27 DIAGNOSIS — C50919 Malignant neoplasm of unspecified site of unspecified female breast: Secondary | ICD-10-CM

## 2013-11-27 DIAGNOSIS — R971 Elevated cancer antigen 125 [CA 125]: Secondary | ICD-10-CM

## 2013-11-27 DIAGNOSIS — K589 Irritable bowel syndrome without diarrhea: Secondary | ICD-10-CM | POA: Diagnosis not present

## 2013-11-27 DIAGNOSIS — E871 Hypo-osmolality and hyponatremia: Secondary | ICD-10-CM

## 2013-11-27 DIAGNOSIS — R739 Hyperglycemia, unspecified: Secondary | ICD-10-CM | POA: Diagnosis not present

## 2013-11-27 DIAGNOSIS — D638 Anemia in other chronic diseases classified elsewhere: Secondary | ICD-10-CM

## 2013-11-27 HISTORY — DX: Elevated cancer antigen 125 (CA 125): R97.1

## 2013-11-27 HISTORY — DX: Irritable bowel syndrome, unspecified: K58.9

## 2013-11-27 NOTE — Progress Notes (Signed)
Patient ID: Cassandra Glenn, female   DOB: 1920-02-21, 80 y.o.   MRN: [id number]    [facility]     Place of Service: Clinic (12)    Allergies  Allergen Reactions  . Macrodantin [Nitrofurantoin Macrocrystal] Other (See Comments)    unknown  . Septra [Sulfamethoxazole-Trimethoprim] Other (See Comments)    unknown    Chief Complaint  Patient presents with  . Medical Management of Chronic Issues    anemia, hyponatremia, hyperglycemia, pleuritic chest pain.  Here with daughter [name]     HPI:  Cancer antigen 125 (CA 125) elevation: noted by Dr. Matthew Saras.  She has an ultrasound of the pelvic area scheduled for today.  Patient has never had any female surgery.  She still has her uterus and ovaries.  IBS (irritable bowel syndrome): patient complains of changes in stools which consists of intermittent constipation and, more frequently, looser stools.  This is not improved with probiotics.  There is mild cramping associated with it.  There has been no blood in the stool.  Bowels are active, it seems to affect her bladder with increased urgency and frequency.  The problem seemed to be more dominant at night.  Once she is up and had a bowel movement, she seems to be okay for the rest of the day.  Hyponatremia: chronic and intermittent.  Recently had sodium level at 135, most recent lab showed sodium 132.  Hyperglycemia:most recent labs showed glucose 90  Anemia, chronic disease:resolved.  Hemoglobin 12.1 on 11/19/13  Purpura senilis: improved  Cancer of breast, female, unspecified laterality: remote history.  Sometimes there is a tight uncomfortable feeling in the right axillary area.  She had radiation in this area in the past following removal of the cancer surgically.    Medications: Patient's Medications  New Prescriptions   No medications on file  Previous Medications   ACETAMINOPHEN (TYLENOL) 500 MG TABLET    Take 1,000 mg by mouth every 6 (six) hours as needed for  moderate pain.    ASPIRIN EC 81 MG TABLET    Take 81 mg by mouth daily.   BILBERRY 1000 MG CAPS    Take 1,000 mg by mouth daily.    CALCIUM CARBONATE-VITAMIN D (CALCIUM 600/VITAMIN D PO)    Take by mouth 2 (two) times daily.   CALCIUM POLYCARBOPHIL (FIBERCON PO)    Take 1 tablet by mouth daily. Take one tablet twice daily for fiber   CHOLECALCIFEROL (VITAMIN D) 400 UNITS TABS    Take 400 Units by mouth daily.    FISH OIL-OMEGA-3 FATTY ACIDS 1000 MG CAPSULE    Take 1 g by mouth daily.    GLUCOSAMINE-CHONDROITIN (GLUCOSAMINE CHONDR COMPLEX PO)    Take by mouth. Take one tablet twice daily for joints   MULTIPLE VITAMINS-MINERALS (PRESERVISION/LUTEIN PO)    Take 1 capsule by mouth 2 (two) times daily. For vision   MULTIPLE VITAMINS-MINERALS (STRESS FORMULA 500/ZINC) TABS    Take 1 tablet by mouth daily.   PROBIOTIC PRODUCT (RESTORA) CAPS    Take 1 capsule by mouth daily.   VENLAFAXINE (EFFEXOR) 75 MG TABLET    Take 75 mg by mouth daily.   ZOLPIDEM (AMBIEN) 5 MG TABLET    Take 2.5 mg by mouth at bedtime as needed for sleep.  Modified Medications   No medications on file  Discontinued Medications   GLUCOSAMINE-VITAMIN D (GLUCOSAMINE PLUS VITAMIN D) 1500-200 MG-UNIT TABS    Take 1 tablet by mouth 2 (two) times daily.  Take 1 tablet twice daily for joint health     Review of Systems  Constitutional: Negative for fever, activity change, fatigue and unexpected weight change.  HENT: Positive for hearing loss. Negative for ear pain.   Eyes:       History of loss of visual acuity and macular degeneration.  Respiratory:       History of breast discomfort. History of removal of a lump of cancer from the right breast.  Cardiovascular: Negative for chest pain, palpitations and leg swelling.  Gastrointestinal:       Chronic sluggish stools and straining. She frequently complains that her intestines are not working right. There is a previous history of diverticulosis. Her last colonoscopy was in 2008. She  has been seen by Dr. Paulita Fujita in the past. Complains of bloated feeling.  Genitourinary:       Complains of a slow, weak urinary stream. Denies dysuria. Has increased frequency and nocturia x 4. Vaginal discomfort. Managed by Dr. Claudean Kinds.  Musculoskeletal:       Chronic back pain. Pain down the right leg. Some restriction of joint motion of the right hip.. She feels that there is something loose in the right hip area. There is generalized stiffness in the joints. There is a history of spinal stenosis. Left third finger catches on extension sometimes. There is a trigger finger. Pain at the left iliac crest and left anterior ribs near the sternum.  Skin:       History of skin discoloration and changes of nails. Previous history of folliculitis of the left lower leg. Folliculitis has cleared. Denies itching or rash.  Allergic/Immunologic: Negative.   Neurological: Negative for dizziness, tremors, seizures and numbness.       History of spinal stenosis with neurogenic claudication affecting the right leg. Complains of pain in the buttocks that is suggestive of neuralgia from her spinal stenosis.  Hematological: Bruises/bleeds easily.  Psychiatric/Behavioral: Negative.     Filed Vitals:   11/27/13 1006  BP: 144/74  Pulse: 72  Weight: 128 lb (58.06 kg)  SpO2: 97%   Body mass index is 24.2 kg/(m^2).  Physical Exam  Constitutional: She is oriented to person, place, and time. She appears well-developed and well-nourished. No distress.  HENT:  HOH  Eyes:  Corrective lenses.  Cardiovascular: Normal rate, regular rhythm, normal heart sounds and intact distal pulses.   Pulmonary/Chest: Effort normal and breath sounds normal. She has no rales.  Abdominal: Soft. Bowel sounds are normal. She exhibits no distension and no mass. There is no tenderness.  Musculoskeletal: Normal range of motion. She exhibits no edema or tenderness.  Neurological: She is alert and oriented to person, place, and time.   Skin: Skin is warm and dry. There is pallor.  Psychiatric: She has a normal mood and affect.     Labs reviewed: Nursing Home on 11/27/2013  Component Date Value Ref Range Status  . Hemoglobin 11/19/2013 12.1  12.0 - 16.0 g/dL Final  . HCT 11/19/2013 37  36 - 46 % Final  . Platelets 11/19/2013 384  150 - 399 K/L Final  . WBC 11/19/2013 9.0   Final  . Glucose 11/19/2013 90   Final  . BUN 11/19/2013 13  4 - 21 mg/dL Final  . Creatinine 11/19/2013 0.7  0.5 - 1.1 mg/dL Final  . Potassium 11/19/2013 4.5  3.4 - 5.3 mmol/L Final  . Sodium 11/19/2013 132* 137 - 147 mmol/L Final  . Alkaline Phosphatase 11/19/2013 97  25 - 125 U/L  Final  . ALT 11/19/2013 16  7 - 35 U/L Final  . AST 11/19/2013 20  13 - 35 U/L Final  . Bilirubin, Total 11/19/2013 0.4   Final  Office Visit on 11/01/2013  Component Date Value Ref Range Status  . Glucose 11/01/2013 94  65 - 99 mg/dL Final  . BUN 11/01/2013 14  10 - 36 mg/dL Final  . Creatinine, Ser 11/01/2013 0.75  0.57 - 1.00 mg/dL Final  . GFR calc non Af Amer 11/01/2013 70  >59 mL/min/1.73 Final  . GFR calc Af Amer 11/01/2013 81  >59 mL/min/1.73 Final  . BUN/Creatinine Ratio 11/01/2013 19  11 - 26 Final  . Sodium 11/01/2013 133* 134 - 144 mmol/L Final  . Potassium 11/01/2013 4.4  3.5 - 5.2 mmol/L Final  . Chloride 11/01/2013 93* 97 - 108 mmol/L Final  . CO2 11/01/2013 25  18 - 29 mmol/L Final  . Calcium 11/01/2013 9.2  8.7 - 10.3 mg/dL Final  Office Visit on 10/19/2013  Component Date Value Ref Range Status  . Glucose 10/19/2013 101* 65 - 99 mg/dL Final  . BUN 10/19/2013 10  10 - 36 mg/dL Final  . Creatinine, Ser 10/19/2013 0.57  0.57 - 1.00 mg/dL Final  . GFR calc non Af Amer 10/19/2013 81  >59 mL/min/1.73 Final  . GFR calc Af Amer 10/19/2013 94  >59 mL/min/1.73 Final  . BUN/Creatinine Ratio 10/19/2013 18  11 - 26 Final  . Sodium 10/19/2013 132* 134 - 144 mmol/L Final  . Potassium 10/19/2013 4.4  3.5 - 5.2 mmol/L Final  . Chloride 10/19/2013 91* 97  - 108 mmol/L Final  . CO2 10/19/2013 24  18 - 29 mmol/L Final  . Calcium 10/19/2013 9.5  8.7 - 10.3 mg/dL Final  Admission on 10/16/2013, Discharged on 10/16/2013  Component Date Value Ref Range Status  . Sodium 10/16/2013 129* 137 - 147 mEq/L Final  . Potassium 10/16/2013 4.0  3.7 - 5.3 mEq/L Final  . Chloride 10/16/2013 91* 96 - 112 mEq/L Final  . CO2 10/16/2013 24  19 - 32 mEq/L Final  . Glucose, Bld 10/16/2013 113* 70 - 99 mg/dL Final  . BUN 10/16/2013 10  6 - 23 mg/dL Final  . Creatinine, Ser 10/16/2013 0.56  0.50 - 1.10 mg/dL Final  . Calcium 10/16/2013 8.9  8.4 - 10.5 mg/dL Final  . Total Protein 10/16/2013 6.6  6.0 - 8.3 g/dL Final  . Albumin 10/16/2013 2.9* 3.5 - 5.2 g/dL Final  . AST 10/16/2013 21  0 - 37 U/L Final  . ALT 10/16/2013 15  0 - 35 U/L Final  . Alkaline Phosphatase 10/16/2013 93  39 - 117 U/L Final  . Total Bilirubin 10/16/2013 0.4  0.3 - 1.2 mg/dL Final  . GFR calc non Af Amer 10/16/2013 80* >90 mL/min Final  . GFR calc Af Amer 10/16/2013 >90  >90 mL/min Final   Comment: (NOTE)                          The eGFR has been calculated using the CKD EPI equation.                          This calculation has not been validated in all clinical situations.                          eGFR's persistently <90 mL/min signify possible  Chronic Kidney                          Disease.  . Anion gap 10/16/2013 14  5 - 15 Final  . Lipase 10/16/2013 16  11 - 59 U/L Final  . WBC 10/16/2013 11.6* 4.0 - 10.5 K/uL Final  . RBC 10/16/2013 3.66* 3.87 - 5.11 MIL/uL Final  . Hemoglobin 10/16/2013 10.3* 12.0 - 15.0 g/dL Final  . HCT 10/16/2013 31.3* 36.0 - 46.0 % Final  . MCV 10/16/2013 85.5  78.0 - 100.0 fL Final  . MCH 10/16/2013 28.1  26.0 - 34.0 pg Final  . MCHC 10/16/2013 32.9  30.0 - 36.0 g/dL Final  . RDW 10/16/2013 14.3  11.5 - 15.5 % Final  . Platelets 10/16/2013 295  150 - 400 K/uL Final  . Neutrophils Relative % 10/16/2013 70  43 - 77 % Final  . Neutro Abs 10/16/2013 8.1*  1.7 - 7.7 K/uL Final  . Lymphocytes Relative 10/16/2013 15  12 - 46 % Final  . Lymphs Abs 10/16/2013 1.7  0.7 - 4.0 K/uL Final  . Monocytes Relative 10/16/2013 14* 3 - 12 % Final  . Monocytes Absolute 10/16/2013 1.6* 0.1 - 1.0 K/uL Final  . Eosinophils Relative 10/16/2013 1  0 - 5 % Final  . Eosinophils Absolute 10/16/2013 0.1  0.0 - 0.7 K/uL Final  . Basophils Relative 10/16/2013 0  0 - 1 % Final  . Basophils Absolute 10/16/2013 0.0  0.0 - 0.1 K/uL Final  . Troponin i, poc 10/16/2013 0.01  0.00 - 0.08 ng/mL Final  . Comment 3 10/16/2013          Final   Comment: Due to the release kinetics of cTnI,                          a negative result within the first hours                          of the onset of symptoms does not rule out                          myocardial infarction with certainty.                          If myocardial infarction is still suspected,                          repeat the test at appropriate intervals.  . Pro B Natriuretic peptide (BNP) 10/16/2013 1099.0* 0 - 450 pg/mL Final  . aPTT 10/16/2013 34  24 - 37 seconds Final  . Prothrombin Time 10/16/2013 14.2  11.6 - 15.2 seconds Final  . INR 10/16/2013 1.10  0.00 - 1.49 Final  . D-Dimer, Quant 10/16/2013 <0.27  0.00 - 0.48 ug/mL-FEU Final   Comment:                                 AT THE INHOUSE ESTABLISHED CUTOFF                          VALUE OF 0.48 ug/mL FEU,  THIS ASSAY HAS BEEN DOCUMENTED                          IN THE LITERATURE TO HAVE                          A SENSITIVITY AND NEGATIVE                          PREDICTIVE VALUE OF AT LEAST                          98 TO 99%.  THE TEST RESULT                          SHOULD BE CORRELATED WITH                          AN ASSESSMENT OF THE CLINICAL                          PROBABILITY OF DVT / VTE.     Assessment/Plan  1. Cancer antigen 125 (CA 125) elevation Follow-up with Dr. Matthew Saras.  I asked patient to forward results of  her ultrasound to me.  2. IBS (irritable bowel syndrome) Wait for results of ultrasound. Consider use of hyoscyamine 0.125 mg at bedtime if there are no other significant findings on the ultrasound.  3. Hyponatremia Last sodium 132 mEq per liter.  Continue to monitor.  No change in medications.  4. Hyperglycemia Last lab normal.  Continue to monitor periodically.  5. Anemia, chronic disease resolved  6. Purpura senilis improved  7. Cancer of breast, female, unspecified laterality No known reoccurrence

## 2013-12-13 ENCOUNTER — Telehealth: Payer: Self-pay | Admitting: *Deleted

## 2013-12-13 DIAGNOSIS — H35373 Puckering of macula, bilateral: Secondary | ICD-10-CM | POA: Diagnosis not present

## 2013-12-13 DIAGNOSIS — H3532 Exudative age-related macular degeneration: Secondary | ICD-10-CM | POA: Diagnosis not present

## 2013-12-13 DIAGNOSIS — Z961 Presence of intraocular lens: Secondary | ICD-10-CM | POA: Diagnosis not present

## 2013-12-13 DIAGNOSIS — H3531 Nonexudative age-related macular degeneration: Secondary | ICD-10-CM | POA: Diagnosis not present

## 2013-12-13 DIAGNOSIS — H3562 Retinal hemorrhage, left eye: Secondary | ICD-10-CM | POA: Diagnosis not present

## 2013-12-13 DIAGNOSIS — H43813 Vitreous degeneration, bilateral: Secondary | ICD-10-CM | POA: Diagnosis not present

## 2013-12-13 MED ORDER — DOXYCYCLINE HYCLATE 100 MG PO TABS: ORAL_TABLET | ORAL | Status: DC

## 2013-12-13 NOTE — Telephone Encounter (Signed)
Patient daughter, Wilford Grist called and stated that patient took a Home Urine Test and it came back with infection. Daughter wants to know since patient has recurring UTI's should she get started on an antibiotic before it gets bad. Please Advise.

## 2013-12-13 NOTE — Telephone Encounter (Signed)
Rx faxed to pharmacy and LM with daughter that Rx was called into pharmacy

## 2013-12-13 NOTE — Telephone Encounter (Signed)
:   Prescription for doxycycline 100 mg. Dispense 20 tablets. Take 1 tablet twice daily for infection.

## 2013-12-17 DIAGNOSIS — L603 Nail dystrophy: Secondary | ICD-10-CM | POA: Diagnosis not present

## 2013-12-17 DIAGNOSIS — I739 Peripheral vascular disease, unspecified: Secondary | ICD-10-CM | POA: Diagnosis not present

## 2013-12-27 ENCOUNTER — Telehealth: Payer: Self-pay | Admitting: *Deleted

## 2013-12-27 MED ORDER — CIPROFLOXACIN HCL 250 MG PO TABS: ORAL_TABLET | ORAL | Status: DC

## 2013-12-27 NOTE — Telephone Encounter (Signed)
Ciprofloxacin 250 mg #20 tabs Sig: one twice daily for infection

## 2013-12-27 NOTE — Telephone Encounter (Signed)
Patient daughter called and stated that patient has completed antibiotic for UTI and waited a couple of days and repeated the at home Urine test and it was still Positive. Daughter doesn't want her to go through the [place] hurting so requesting another antibiotic. Please Advise.

## 2013-12-27 NOTE — Telephone Encounter (Signed)
Faxed Rx into pharmacy and daughter notified.

## 2014-01-22 ENCOUNTER — Telehealth: Payer: Self-pay | Admitting: *Deleted

## 2014-01-22 NOTE — Telephone Encounter (Signed)
Patient called and left message regarding she thinks she has a UTI and wants to know what to do.  Tried calling patient and Left Message to Return call.

## 2014-01-23 ENCOUNTER — Ambulatory Visit (INDEPENDENT_AMBULATORY_CARE_PROVIDER_SITE_OTHER): Payer: Medicare Other | Admitting: Internal Medicine

## 2014-01-23 ENCOUNTER — Encounter: Payer: Self-pay | Admitting: Internal Medicine

## 2014-01-23 VITALS — BP 110/72 | HR 80 | Temp 97.7°F | Resp 18 | Ht 61.0 in | Wt 127.4 lb

## 2014-01-23 DIAGNOSIS — R3915 Urgency of urination: Secondary | ICD-10-CM

## 2014-01-23 DIAGNOSIS — R350 Frequency of micturition: Secondary | ICD-10-CM

## 2014-01-23 LAB — POCT URINALYSIS DIPSTICK
BILIRUBIN UA: NEGATIVE
Blood, UA: NEGATIVE
Glucose, UA: NEGATIVE
KETONES UA: NEGATIVE
Nitrite, UA: NEGATIVE
PH UA: 6
PROTEIN UA: NEGATIVE
Urobilinogen, UA: 0.2

## 2014-01-23 NOTE — Telephone Encounter (Signed)
Spoke with patient and states she will come in to the office be seen but wants [place] to call her daughter # [phone] to schedule an appointment around her schedule.  Called daughter, [name] and [facility] to return call.

## 2014-01-23 NOTE — Telephone Encounter (Signed)
Patient daughter scheduled an appointment for today

## 2014-01-23 NOTE — Progress Notes (Signed)
Patient ID: Cassandra Glenn, female   DOB: 1920/01/13, 81 y.o.   MRN: [id number]    Facility  [facility]    Place of Service:   OFFICE   Allergies  Allergen Reactions  . Macrodantin [Nitrofurantoin Macrocrystal] Other (See Comments)    unknown  . Septra [Sulfamethoxazole-Trimethoprim] Other (See Comments)    unknown    Chief Complaint  Patient presents with  . Acute Visit    ? UTI    HPI:  81 yo female seen today for difficulty urinating, intermittent dysuria and urinary urgency x 1.5 mos. She took doxycycline which was ineffective and cipro was completed. Tried pyridium OTC without relief.Took home test which showed (+). Daughter present  Medications: Patient's Medications  New Prescriptions   No medications on file  Previous Medications   ACETAMINOPHEN (TYLENOL) 500 MG TABLET    Take 1,000 mg by mouth every 6 (six) hours as needed for moderate pain.    ASPIRIN EC 81 MG TABLET    Take 81 mg by mouth daily.   BILBERRY 1000 MG CAPS    Take 1,000 mg by mouth daily.    CALCIUM CARBONATE-VITAMIN D (CALCIUM 600/VITAMIN D PO)    Take by mouth 2 (two) times daily.   CALCIUM POLYCARBOPHIL (FIBERCON PO)    Take 1 tablet by mouth daily. Take one tablet twice daily for fiber   CHOLECALCIFEROL (VITAMIN D) 400 UNITS TABS    Take 400 Units by mouth daily.    CIPROFLOXACIN (CIPRO) 250 MG TABLET    Take one tablet by mouth twice daily for infection   FISH OIL-OMEGA-3 FATTY ACIDS 1000 MG CAPSULE    Take 1 g by mouth daily.    GLUCOSAMINE-CHONDROITIN (GLUCOSAMINE CHONDR COMPLEX PO)    Take by mouth. Take one tablet twice daily for joints   MULTIPLE VITAMINS-MINERALS (PRESERVISION/LUTEIN PO)    Take 1 capsule by mouth 2 (two) times daily. For vision   MULTIPLE VITAMINS-MINERALS (STRESS FORMULA 500/ZINC) TABS    Take 1 tablet by mouth daily.   PROBIOTIC PRODUCT (RESTORA) CAPS    Take 1 capsule by mouth daily.   VENLAFAXINE (EFFEXOR) 75 MG TABLET    Take 75 mg by mouth daily.   ZOLPIDEM (AMBIEN) 5 MG  TABLET    Take 2.5 mg by mouth at bedtime as needed for sleep.  Modified Medications   No medications on file  Discontinued Medications   No medications on file     Review of Systems  Constitutional: Negative for fever, chills and fatigue.  Respiratory: Negative for chest tightness and shortness of breath.   Cardiovascular: Negative for chest pain and leg swelling.  Genitourinary: Positive for difficulty urinating. Negative for hematuria, flank pain, vaginal bleeding, vaginal discharge, genital sores and pelvic pain.       Filed Vitals:   01/23/14 1658  BP: 110/72  Pulse: 80  Temp: 97.7 F (36.5 C)  TempSrc: Oral  Resp: 18  Height: 5\' 1"  (1.549 m)  Weight: 127 lb 6.4 oz (57.788 kg)  SpO2: 94%   Body mass index is 24.08 kg/(m^2).  Physical Exam  CONSTITUTIONAL: Looks well in NAD. Awake, alert and oriented x 3 ABDOMEN: Bowel sounds present x 4. Soft, nontender, nondistended. No palpable mass or bruit  (+) suprapubic TTP. No CVAT EXTREMITIES: LE edema b/l. Distal pulses palpable. No calf tenderness   Labs reviewed: Nursing Home on 11/27/2013  Component Date Value Ref Range Status  . Hemoglobin 11/19/2013 12.1  12.0 - 16.0 g/dL Final  .  HCT 11/19/2013 37  36 - 46 % Final  . Platelets 11/19/2013 384  150 - 399 K/L Final  . WBC 11/19/2013 9.0   Final  . Glucose 11/19/2013 90   Final  . BUN 11/19/2013 13  4 - 21 mg/dL Final  . Creatinine 11/19/2013 0.7  0.5 - 1.1 mg/dL Final  . Potassium 11/19/2013 4.5  3.4 - 5.3 mmol/L Final  . Sodium 11/19/2013 132* 137 - 147 mmol/L Final  . Alkaline Phosphatase 11/19/2013 97  25 - 125 U/L Final  . ALT 11/19/2013 16  7 - 35 U/L Final  . AST 11/19/2013 20  13 - 35 U/L Final  . Bilirubin, Total 11/19/2013 0.4   Final  Office Visit on 11/01/2013  Component Date Value Ref Range Status  . Glucose 11/01/2013 94  65 - 99 mg/dL Final  . BUN 11/01/2013 14  10 - 36 mg/dL Final  . Creatinine, Ser 11/01/2013 0.75  0.57 - 1.00 mg/dL Final    . GFR calc non Af Amer 11/01/2013 70  >59 mL/min/1.73 Final  . GFR calc Af Amer 11/01/2013 81  >59 mL/min/1.73 Final  . BUN/Creatinine Ratio 11/01/2013 19  11 - 26 Final  . Sodium 11/01/2013 133* 134 - 144 mmol/L Final  . Potassium 11/01/2013 4.4  3.5 - 5.2 mmol/L Final  . Chloride 11/01/2013 93* 97 - 108 mmol/L Final  . CO2 11/01/2013 25  18 - 29 mmol/L Final  . Calcium 11/01/2013 9.2  8.7 - 10.3 mg/dL Final   UA reveals leukocytes but no other acute sediment changes  Assessment/Plan    ICD-9-CM ICD-10-CM   1. Urinary urgency 788.63 R39.15 POCT urinalysis dipstick     Ambulatory referral to Urology  2. Urinary frequency 788.41 R35.0 POCT urinalysis dipstick     Ambulatory referral to Urology   -- she reports recurrent sx's that are interfering with her QOL. Next step is urology eval,   Valen Mascaro S. Perlie Gold  [facility] [address] [place], [place] [address] [phone] Office (Wednesdays and Fridays 8 AM - 5 PM) [phone] Cell (Monday-Friday 8 AM - 5 PM)

## 2014-01-26 ENCOUNTER — Encounter: Payer: Self-pay | Admitting: Internal Medicine

## 2014-02-27 DIAGNOSIS — M25551 Pain in right hip: Secondary | ICD-10-CM | POA: Diagnosis not present

## 2014-02-27 DIAGNOSIS — M5441 Lumbago with sciatica, right side: Secondary | ICD-10-CM | POA: Diagnosis not present

## 2014-02-28 DIAGNOSIS — L603 Nail dystrophy: Secondary | ICD-10-CM | POA: Diagnosis not present

## 2014-02-28 DIAGNOSIS — I739 Peripheral vascular disease, unspecified: Secondary | ICD-10-CM | POA: Diagnosis not present

## 2014-03-01 DIAGNOSIS — M6281 Muscle weakness (generalized): Secondary | ICD-10-CM | POA: Diagnosis not present

## 2014-03-01 DIAGNOSIS — M5441 Lumbago with sciatica, right side: Secondary | ICD-10-CM | POA: Diagnosis not present

## 2014-03-04 DIAGNOSIS — M5441 Lumbago with sciatica, right side: Secondary | ICD-10-CM | POA: Diagnosis not present

## 2014-03-04 DIAGNOSIS — M6281 Muscle weakness (generalized): Secondary | ICD-10-CM | POA: Diagnosis not present

## 2014-03-05 DIAGNOSIS — M5441 Lumbago with sciatica, right side: Secondary | ICD-10-CM | POA: Diagnosis not present

## 2014-03-05 DIAGNOSIS — M6281 Muscle weakness (generalized): Secondary | ICD-10-CM | POA: Diagnosis not present

## 2014-03-07 DIAGNOSIS — M5441 Lumbago with sciatica, right side: Secondary | ICD-10-CM | POA: Diagnosis not present

## 2014-03-07 DIAGNOSIS — M6281 Muscle weakness (generalized): Secondary | ICD-10-CM | POA: Diagnosis not present

## 2014-03-11 DIAGNOSIS — R339 Retention of urine, unspecified: Secondary | ICD-10-CM | POA: Diagnosis not present

## 2014-03-11 DIAGNOSIS — N390 Urinary tract infection, site not specified: Secondary | ICD-10-CM | POA: Diagnosis not present

## 2014-03-11 DIAGNOSIS — R350 Frequency of micturition: Secondary | ICD-10-CM | POA: Diagnosis not present

## 2014-03-12 ENCOUNTER — Encounter: Payer: Self-pay | Admitting: Internal Medicine

## 2014-03-12 ENCOUNTER — Non-Acute Institutional Stay: Payer: Medicare Other | Admitting: Internal Medicine

## 2014-03-12 VITALS — BP 140/68 | HR 72 | Temp 97.9°F | Wt 133.0 lb

## 2014-03-12 DIAGNOSIS — N3000 Acute cystitis without hematuria: Secondary | ICD-10-CM | POA: Diagnosis not present

## 2014-03-12 DIAGNOSIS — M5441 Lumbago with sciatica, right side: Secondary | ICD-10-CM | POA: Diagnosis not present

## 2014-03-12 DIAGNOSIS — Z9181 History of falling: Secondary | ICD-10-CM | POA: Diagnosis not present

## 2014-03-12 DIAGNOSIS — S72041D Displaced fracture of base of neck of right femur, subsequent encounter for closed fracture with routine healing: Secondary | ICD-10-CM

## 2014-03-12 DIAGNOSIS — R971 Elevated cancer antigen 125 [CA 125]: Secondary | ICD-10-CM | POA: Diagnosis not present

## 2014-03-12 DIAGNOSIS — K589 Irritable bowel syndrome without diarrhea: Secondary | ICD-10-CM | POA: Diagnosis not present

## 2014-03-12 DIAGNOSIS — R350 Frequency of micturition: Secondary | ICD-10-CM

## 2014-03-12 DIAGNOSIS — M6281 Muscle weakness (generalized): Secondary | ICD-10-CM | POA: Diagnosis not present

## 2014-03-12 NOTE — Progress Notes (Signed)
Patient ID: Cassandra Glenn, female   DOB: December 04, 1920, 81 y.o.   MRN: [id number]    [facility]     Place of Service: Clinic (12)    Allergies  Allergen Reactions  . Macrodantin [Nitrofurantoin Macrocrystal] Other (See Comments)    unknown  . Septra [Sulfamethoxazole-Trimethoprim] Other (See Comments)    unknown    Chief Complaint  Patient presents with  . Medical Management of Chronic Issues    IBS, recurrent UTI. Saw Dr. Louis Meckel yesterday, not emptying bladder, to use a catheter. Taking PT for back, tight muscles  . Fall    one month ago, tripped over rug, fell to right side, no injury.  Walks with walker, here with daughter [name]    HPI:   Urinary frequency: Has seen Dr. Louis Meckel, urologist. Will be taught self-catheterization for a poorly emptying bladder.  Cancer antigen 125 (CA 125) elevation: Normal ultrasound of the lower abdomen. She is now on follow-up blood work every 6 months by Dr. Claudean Kinds. No cancer has been found.  IBS (irritable bowel syndrome): Continues with mainly constipation related to this problem. Has lower abdominal distress sometimes.  Acute cystitis without hematuria: Recurrent infections.  Closed fracture of base of neck of femur, right, with routine healing, subsequent encounter: Discomfort in the right hip going down the right leg. Shorter right leg with built-up right shoe. Sometimes feels weaker in the right leg as if it will "give way". Bretta Bang is a history of fracture of the right leg.  History of fall occurred in January 2016. No injury. Says her foot caught on a rug and she fell on the right side.    Medications: Patient's Medications  New Prescriptions   No medications on file  Previous Medications   ACETAMINOPHEN (TYLENOL) 500 MG TABLET    Take 1,000 mg by mouth every 6 (six) hours as needed for moderate pain.    ASPIRIN EC 81 MG TABLET    Take 81 mg by mouth daily.   BILBERRY 1000 MG CAPS    Take 1,000 mg by mouth  daily.    CALCIUM CARBONATE-VITAMIN D (CALCIUM 600/VITAMIN D PO)    Take by mouth 2 (two) times daily.   CALCIUM POLYCARBOPHIL (FIBERCON PO)    Take 1 tablet by mouth daily. Take one tablet twice daily for fiber   CHOLECALCIFEROL (VITAMIN D) 400 UNITS TABS    Take 400 Units by mouth daily.    FISH OIL-OMEGA-3 FATTY ACIDS 1000 MG CAPSULE    Take 1 g by mouth daily.    GLUCOSAMINE-CHONDROITIN (GLUCOSAMINE CHONDR COMPLEX PO)    Take by mouth. Take one tablet twice daily for joints   MULTIPLE VITAMINS-MINERALS (STRESS FORMULA 500/ZINC) TABS    Take 1 tablet by mouth daily.   PROBIOTIC PRODUCT (RESTORA) CAPS    Take 1 capsule by mouth daily.   VENLAFAXINE (EFFEXOR) 75 MG TABLET    Take 75 mg by mouth daily.   ZOLPIDEM (AMBIEN) 5 MG TABLET    Take 2.5 mg by mouth at bedtime as needed for sleep.  Modified Medications   No medications on file  Discontinued Medications   CIPROFLOXACIN (CIPRO) 250 MG TABLET    Take one tablet by mouth twice daily for infection   MULTIPLE VITAMINS-MINERALS (PRESERVISION/LUTEIN PO)    Take 1 capsule by mouth 2 (two) times daily. For vision     Review of Systems  Constitutional: Negative for fever, activity change, fatigue and unexpected weight change.  HENT: Positive for  hearing loss. Negative for ear pain.   Eyes:       History of loss of visual acuity and macular degeneration.  Respiratory:       History of breast discomfort. History of removal of a lump of cancer from the right breast. Dyspnea on exertion.  Cardiovascular: Negative for chest pain, palpitations and leg swelling.  Gastrointestinal:       Chronic sluggish stools and straining. She frequently complains that her intestines are not working right. There is a previous history of diverticulosis. Her last colonoscopy was in 2008. She has been seen by Dr. Paulita Fujita in the past. Complains of bloated feeling.  Genitourinary:       Complains of a slow, weak urinary stream. Denies dysuria. Has increased  frequency and nocturia x 4. Saw Dr. Louis Meckel, urologist. She is being taught self-catheterization for a bladder that does not empty well. Vaginal discomfort. Managed by Dr. Claudean Kinds. History of elevated CEA 125. Normal ultrasound of the abdomen and genitourinary tract.  Musculoskeletal:       Chronic back pain. Pain down the right leg. Some restriction of joint motion of the right hip.. She feels that there is something loose in the right hip area.  Patient has a shorter right leg and has a lift in the right shoe. Now seeing Dr. Rhona Raider. There is generalized stiffness in the joints. There is a history of spinal stenosis. Left third finger catches on extension sometimes. There is a trigger finger. Pain at the left iliac crest and left anterior ribs near the sternum.  Skin:       History of skin discoloration and changes of nails. Previous history of folliculitis of the left lower leg. Folliculitis has cleared. Denies itching or rash.  Allergic/Immunologic: Negative.   Neurological: Negative for dizziness, tremors, seizures and numbness.       History of spinal stenosis with neurogenic claudication affecting the right leg. Complains of pain in the buttocks that is suggestive of neuralgia from her spinal stenosis.  Hematological: Bruises/bleeds easily.  Psychiatric/Behavioral: Negative.     Filed Vitals:   03/12/14 0905  BP: 140/68  Pulse: 72  Temp: 97.9 F (36.6 C)  TempSrc: Oral  Weight: 133 lb (60.328 kg)   Body mass index is 25.14 kg/(m^2).  Physical Exam  Constitutional: She is oriented to person, place, and time. She appears well-developed and well-nourished. No distress.  HENT:  HOH  Eyes:  Corrective lenses.  Neck: No JVD present. No tracheal deviation present. No thyromegaly present.  Cardiovascular: Normal rate, regular rhythm, normal heart sounds and intact distal pulses.  Exam reveals no gallop and no friction rub.   No murmur heard. Pulmonary/Chest: Effort normal and  breath sounds normal. She has no rales.  Abdominal: Soft. Bowel sounds are normal. She exhibits no distension and no mass. There is no tenderness.  Musculoskeletal: Normal range of motion. She exhibits no edema or tenderness.  Poor balance; uses a cane and walker.  Lymphadenopathy:    She has no cervical adenopathy.  Neurological: She is alert and oriented to person, place, and time. No cranial nerve deficit.  Skin: Skin is warm and dry. There is pallor.  Psychiatric: She has a normal mood and affect. Her behavior is normal. Thought content normal.     Labs reviewed: Office Visit on 01/23/2014  Component Date Value Ref Range Status  . Color, UA 01/23/2014 Yellow   Final  . Clarity, UA 01/23/2014 Clear   Final  . Glucose, UA 01/23/2014  Neg   Final  . Bilirubin, UA 01/23/2014 Neg   Final  . Ketones, UA 01/23/2014 Neg   Final  . Spec Grav, UA 01/23/2014 <=1.005   Final  . Blood, UA 01/23/2014 Neg   Final  . pH, UA 01/23/2014 6.0   Final  . Protein, UA 01/23/2014 Neg   Final  . Urobilinogen, UA 01/23/2014 0.2   Final  . Nitrite, UA 01/23/2014 Neg   Final  . Leukocytes, UA 01/23/2014 small (1+)   Final     Assessment/Plan 1. Urinary frequency Self-catheterization  2. Cancer antigen 125 (CA 125) elevation Follow-up as scheduled with Dr. Claudean Kinds  3. IBS (irritable bowel syndrome) Chronic condition unchanged  4. Acute cystitis without hematuria No active symptoms at this time  5. Closed fracture of base of neck of femur, right, with routine healing, subsequent encounter Continue with orthopedist as scheduled  6. History of fall Continue walker and cane

## 2014-03-14 DIAGNOSIS — M6281 Muscle weakness (generalized): Secondary | ICD-10-CM | POA: Diagnosis not present

## 2014-03-14 DIAGNOSIS — M5441 Lumbago with sciatica, right side: Secondary | ICD-10-CM | POA: Diagnosis not present

## 2014-03-15 DIAGNOSIS — R339 Retention of urine, unspecified: Secondary | ICD-10-CM | POA: Diagnosis not present

## 2014-03-19 DIAGNOSIS — M5441 Lumbago with sciatica, right side: Secondary | ICD-10-CM | POA: Diagnosis not present

## 2014-03-19 DIAGNOSIS — M6281 Muscle weakness (generalized): Secondary | ICD-10-CM | POA: Diagnosis not present

## 2014-03-21 ENCOUNTER — Other Ambulatory Visit: Payer: Self-pay | Admitting: Internal Medicine

## 2014-03-21 DIAGNOSIS — M5441 Lumbago with sciatica, right side: Secondary | ICD-10-CM | POA: Diagnosis not present

## 2014-03-21 DIAGNOSIS — M6281 Muscle weakness (generalized): Secondary | ICD-10-CM | POA: Diagnosis not present

## 2014-03-21 NOTE — Telephone Encounter (Signed)
Patient confirmed she is taking 1/2 daily

## 2014-03-21 NOTE — Telephone Encounter (Signed)
Left message on voicemail for patient to return call when available   

## 2014-03-25 DIAGNOSIS — M6281 Muscle weakness (generalized): Secondary | ICD-10-CM | POA: Diagnosis not present

## 2014-03-25 DIAGNOSIS — M5441 Lumbago with sciatica, right side: Secondary | ICD-10-CM | POA: Diagnosis not present

## 2014-03-27 DIAGNOSIS — M25551 Pain in right hip: Secondary | ICD-10-CM | POA: Diagnosis not present

## 2014-03-28 DIAGNOSIS — M6281 Muscle weakness (generalized): Secondary | ICD-10-CM | POA: Diagnosis not present

## 2014-03-28 DIAGNOSIS — M5441 Lumbago with sciatica, right side: Secondary | ICD-10-CM | POA: Diagnosis not present

## 2014-03-29 ENCOUNTER — Other Ambulatory Visit: Payer: Self-pay | Admitting: Orthopaedic Surgery

## 2014-03-29 DIAGNOSIS — M25551 Pain in right hip: Secondary | ICD-10-CM

## 2014-04-02 ENCOUNTER — Telehealth: Payer: Self-pay

## 2014-04-02 ENCOUNTER — Ambulatory Visit
Admission: RE | Admit: 2014-04-02 | Discharge: 2014-04-02 | Disposition: A | Discharge status: Home / Self Care | Payer: Medicare Other | Source: Ambulatory Visit | Attending: Orthopaedic Surgery | Admitting: Orthopaedic Surgery

## 2014-04-02 DIAGNOSIS — M87051 Idiopathic aseptic necrosis of right femur: Secondary | ICD-10-CM | POA: Diagnosis not present

## 2014-04-02 DIAGNOSIS — M6281 Muscle weakness (generalized): Secondary | ICD-10-CM | POA: Diagnosis not present

## 2014-04-02 DIAGNOSIS — M25551 Pain in right hip: Secondary | ICD-10-CM

## 2014-04-02 DIAGNOSIS — M167 Other unilateral secondary osteoarthritis of hip: Secondary | ICD-10-CM | POA: Diagnosis not present

## 2014-04-02 DIAGNOSIS — M5441 Lumbago with sciatica, right side: Secondary | ICD-10-CM | POA: Diagnosis not present

## 2014-04-02 MED ORDER — VENLAFAXINE HCL 75 MG PO TABS: ORAL_TABLET | ORAL | Status: DC

## 2014-04-02 NOTE — Telephone Encounter (Signed)
Patient came by [facility] clinic for written Rx for Venlafaxine 75mg , last seen 03/12/14. Rx written

## 2014-04-03 DIAGNOSIS — M25551 Pain in right hip: Secondary | ICD-10-CM | POA: Diagnosis not present

## 2014-04-04 DIAGNOSIS — M5441 Lumbago with sciatica, right side: Secondary | ICD-10-CM | POA: Diagnosis not present

## 2014-04-04 DIAGNOSIS — M6281 Muscle weakness (generalized): Secondary | ICD-10-CM | POA: Diagnosis not present

## 2014-04-09 DIAGNOSIS — M6281 Muscle weakness (generalized): Secondary | ICD-10-CM | POA: Diagnosis not present

## 2014-04-09 DIAGNOSIS — M5441 Lumbago with sciatica, right side: Secondary | ICD-10-CM | POA: Diagnosis not present

## 2014-04-12 DIAGNOSIS — M5441 Lumbago with sciatica, right side: Secondary | ICD-10-CM | POA: Diagnosis not present

## 2014-04-12 DIAGNOSIS — M6281 Muscle weakness (generalized): Secondary | ICD-10-CM | POA: Diagnosis not present

## 2014-04-16 ENCOUNTER — Non-Acute Institutional Stay: Payer: Medicare Other | Admitting: Internal Medicine

## 2014-04-16 ENCOUNTER — Encounter: Payer: Self-pay | Admitting: Internal Medicine

## 2014-04-16 VITALS — BP 140/68 | HR 60 | Temp 97.4°F | Wt 133.0 lb

## 2014-04-16 DIAGNOSIS — M5441 Lumbago with sciatica, right side: Secondary | ICD-10-CM | POA: Diagnosis not present

## 2014-04-16 DIAGNOSIS — S72041D Displaced fracture of base of neck of right femur, subsequent encounter for closed fracture with routine healing: Secondary | ICD-10-CM

## 2014-04-16 DIAGNOSIS — M6281 Muscle weakness (generalized): Secondary | ICD-10-CM | POA: Diagnosis not present

## 2014-04-16 NOTE — Progress Notes (Signed)
Patient ID: Cassandra Glenn, female   DOB: Nov 06, 1920, 81 y.o.   MRN: [id number]    [facility]     Place of Service: Clinic (12)    Allergies  Allergen Reactions  . Macrodantin [Nitrofurantoin Macrocrystal] Other (See Comments)    unknown  . Septra [Sulfamethoxazole-Trimethoprim] Other (See Comments)    unknown    Chief Complaint  Patient presents with  . Medical Management of Chronic Issues    wants to talk about her up coming hip replacement 05/28/14 Dr. Rhona Raider. Patient wants to make ssure she would be able to go through the surgery    HPI:  Closed fracture of base of neck of femur, right, with routine healing, subsequent encounter: CT scan of the right hip with contrast was done 04/02/2014 .  Ct Hip Right Wo Contrast  04/02/2014   CLINICAL DATA:  RIGHT hip pain radiating down the RIGHT leg. Recent trauma. Fall 2 months ago.  EXAM: CT OF THE RIGHT HIP WITHOUT CONTRAST  TECHNIQUE: Multidetector CT imaging of the right hip was performed according to the standard protocol. Multiplanar CT image reconstructions were also generated.  COMPARISON:  03/21/2013 CT.  FINDINGS: Three cannulated RIGHT hip screws are present. RIGHT femoral head AVN is present with fragmentation of the femoral head. Collapse of the femoral neck is present with medialization of the femur. The most superior cannulated screw is loose along the entire length. Small amount of loosening is present along the superficial aspect of the middle screw and the inferior screw does not show a evidence of loosening. Fragmentation of the femoral head has occurred and has progressed compared to the prior exam of 03/21/2013. There is severe loss of the superior joint space compatible with accelerated osteoarthritis. There is no significant loss of acetabular bone stock. There are few small subchondral cysts in the acetabulum. Visible RIGHT obturator ring appears intact. Aortoiliac atherosclerosis is incidentally noted.   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 Progressive fragmentation of the RIGHT femoral head associated with AVN. Severe secondary osteoarthritis of the RIGHT hip.   Electronically Signed   By: Dereck Ligas M.D.   On: 04/02/2014 15:53    Due to persistent pain that interferes with walking, patient and Dr. Rhona Raider are planning to do surgery 05/28/2014.  Other than a previous history of breast cancer, this patient has no other significant continuing medical problems. There is a mild continuing hyponatremia. Last sodium level was 132 months ago.   Medications: Patient's Medications  New Prescriptions   No medications on file  Previous Medications   ACETAMINOPHEN (TYLENOL) 500 MG TABLET    Take 1,000 mg by mouth every 6 (six) hours as needed for moderate pain.    ASPIRIN EC 81 MG TABLET    Take 81 mg by mouth daily.   BILBERRY 1000 MG CAPS    Take 1,000 mg by mouth daily.    CALCIUM CARBONATE-VITAMIN D (CALCIUM 600/VITAMIN D PO)    Take by mouth 2 (two) times daily.   CALCIUM POLYCARBOPHIL (FIBERCON PO)    Take 1 tablet by mouth daily. Take one tablet twice daily for fiber   CHOLECALCIFEROL (VITAMIN D) 400 UNITS TABS    Take 400 Units by mouth daily.    FISH OIL-OMEGA-3 FATTY ACIDS 1000 MG CAPSULE    Take 1 g by mouth daily.    GLUCOSAMINE-CHONDROITIN (GLUCOSAMINE CHONDR COMPLEX PO)    Take by mouth. Take one tablet twice  daily for joints   PROBIOTIC PRODUCT (RESTORA) CAPS    Take 1 capsule by mouth daily.   VENLAFAXINE (EFFEXOR) 75 MG TABLET    Take one tablet daily for depression   ZOLPIDEM (AMBIEN) 5 MG TABLET    1/2 by mouth daily at bedtime as needed for rest  Modified Medications   No medications on file  Discontinued Medications   MULTIPLE VITAMINS-MINERALS (STRESS FORMULA 500/ZINC) TABS    Take 1 tablet by mouth daily.     Review of Systems  Constitutional: Negative for fever, activity change, fatigue and unexpected  weight change.  HENT: Positive for hearing loss. Negative for ear pain.   Eyes:       History of loss of visual acuity and macular degeneration.  Respiratory:       History of breast discomfort. History of removal of a lump of cancer from the right breast. Dyspnea on exertion.  Cardiovascular: Negative for chest pain, palpitations and leg swelling.  Gastrointestinal:       Chronic sluggish stools and straining. She frequently complains that her intestines are not working right. There is a previous history of diverticulosis. Her last colonoscopy was in 2008. She has been seen by Dr. Paulita Fujita in the past. Complains of bloated feeling.  Genitourinary:       Complains of a slow, weak urinary stream. Denies dysuria. Has increased frequency and nocturia x 4. Saw Dr. Louis Meckel, urologist. She is being taught self-catheterization for a bladder that does not empty well. Vaginal discomfort. Managed by Dr. Claudean Kinds. History of elevated CEA 125. Normal ultrasound of the abdomen and genitourinary tract.  Musculoskeletal:       Chronic back pain. Pain down the right leg. Some restriction of joint motion of the right hip.. She feels that there is something loose in the right hip area.  Patient has a shorter right leg and has a lift in the right shoe. Now seeing Dr. Rhona Raider. There is generalized stiffness in the joints. There is a history of spinal stenosis. Left third finger catches on extension sometimes. There is a trigger finger. Pain at the left iliac crest and left anterior ribs near the sternum.  Skin:       History of skin discoloration and changes of nails. Previous history of folliculitis of the left lower leg. Folliculitis has cleared. Denies itching or rash.  Allergic/Immunologic: Negative.   Neurological: Negative for dizziness, tremors, seizures and numbness.       History of spinal stenosis with neurogenic claudication affecting the right leg. Complains of pain in the buttocks that is suggestive  of neuralgia from her spinal stenosis.  Hematological: Bruises/bleeds easily.  Psychiatric/Behavioral: Negative.     Filed Vitals:   04/16/14 1123  BP: 140/68  Pulse: 60  Temp: 97.4 F (36.3 C)  TempSrc: Oral  Weight: 133 lb (60.328 kg)  SpO2: 98%   Body mass index is 25.14 kg/(m^2).  Physical Exam  Constitutional: She is oriented to person, place, and time. She appears well-developed and well-nourished. No distress.  HENT:  HOH  Eyes:  Corrective lenses.  Neck: No JVD present. No tracheal deviation present. No thyromegaly present.  Cardiovascular: Normal rate, regular rhythm, normal heart sounds and intact distal pulses.  Exam reveals no gallop and no friction rub.   No murmur heard. Pulmonary/Chest: Effort normal and breath sounds normal. She has no rales.  Abdominal: Soft. Bowel sounds are normal. She exhibits no distension and no mass. There is no tenderness.  Musculoskeletal: Normal  range of motion. She exhibits no edema or tenderness.  Poor balance; uses a cane and walker.  Lymphadenopathy:    She has no cervical adenopathy.  Neurological: She is alert and oriented to person, place, and time. No cranial nerve deficit.  Skin: Skin is warm and dry. There is pallor.  Psychiatric: She has a normal mood and affect. Her behavior is normal. Thought content normal.     Labs reviewed: Office Visit on 01/23/2014  Component Date Value Ref Range Status  . Color, UA 01/23/2014 Yellow   Final  . Clarity, UA 01/23/2014 Clear   Final  . Glucose, UA 01/23/2014 Neg   Final  . Bilirubin, UA 01/23/2014 Neg   Final  . Ketones, UA 01/23/2014 Neg   Final  . Spec Grav, UA 01/23/2014 <=1.005   Final  . Blood, UA 01/23/2014 Neg   Final  . pH, UA 01/23/2014 6.0   Final  . Protein, UA 01/23/2014 Neg   Final  . Urobilinogen, UA 01/23/2014 0.2   Final  . Nitrite, UA 01/23/2014 Neg   Final  . Leukocytes, UA 01/23/2014 small (1+)   Final     Assessment/Plan  1. Closed fracture of base  of neck of femur, right, with routine healing, subsequent encounter Although she is 81 years of age, Mrs. Sweaney is a generally healthy woman who has no outstanding risk factors for the anticipated surgery.  Due to her continuing discomfort of the right hip and upper leg area, I believe that surgery is warranted and should be safe to perform on this woman.

## 2014-04-18 DIAGNOSIS — M5441 Lumbago with sciatica, right side: Secondary | ICD-10-CM | POA: Diagnosis not present

## 2014-04-18 DIAGNOSIS — M6281 Muscle weakness (generalized): Secondary | ICD-10-CM | POA: Diagnosis not present

## 2014-04-22 DIAGNOSIS — N3281 Overactive bladder: Secondary | ICD-10-CM | POA: Diagnosis not present

## 2014-04-22 DIAGNOSIS — N390 Urinary tract infection, site not specified: Secondary | ICD-10-CM | POA: Diagnosis not present

## 2014-04-22 DIAGNOSIS — R339 Retention of urine, unspecified: Secondary | ICD-10-CM | POA: Diagnosis not present

## 2014-04-25 ENCOUNTER — Other Ambulatory Visit: Payer: Self-pay | Admitting: Orthopaedic Surgery

## 2014-05-09 DIAGNOSIS — I739 Peripheral vascular disease, unspecified: Secondary | ICD-10-CM | POA: Diagnosis not present

## 2014-05-09 DIAGNOSIS — L603 Nail dystrophy: Secondary | ICD-10-CM | POA: Diagnosis not present

## 2014-05-17 ENCOUNTER — Encounter (HOSPITAL_COMMUNITY): Payer: Self-pay

## 2014-05-17 ENCOUNTER — Encounter (HOSPITAL_COMMUNITY)
Admission: RE | Admit: 2014-05-17 | Discharge: 2014-05-17 | Disposition: A | Discharge status: Home / Self Care | Payer: Medicare Other | Source: Ambulatory Visit | Attending: Orthopaedic Surgery | Admitting: Orthopaedic Surgery

## 2014-05-17 DIAGNOSIS — Z01812 Encounter for preprocedural laboratory examination: Secondary | ICD-10-CM | POA: Insufficient documentation

## 2014-05-17 DIAGNOSIS — S72001A Fracture of unspecified part of neck of right femur, initial encounter for closed fracture: Secondary | ICD-10-CM | POA: Diagnosis not present

## 2014-05-17 DIAGNOSIS — X58XXXA Exposure to other specified factors, initial encounter: Secondary | ICD-10-CM | POA: Diagnosis not present

## 2014-05-17 HISTORY — DX: Renal tubulo-interstitial disease, unspecified: N15.9

## 2014-05-17 LAB — SURGICAL PCR SCREEN
MRSA, PCR: NEGATIVE
Staphylococcus aureus: NEGATIVE

## 2014-05-17 LAB — CBC WITH DIFFERENTIAL/PLATELET
BASOS ABS: 0 10*3/uL (ref 0.0–0.1)
BASOS PCT: 0 % (ref 0–1)
EOS PCT: 4 % (ref 0–5)
Eosinophils Absolute: 0.4 10*3/uL (ref 0.0–0.7)
HEMATOCRIT: 39.8 % (ref 36.0–46.0)
HEMOGLOBIN: 12.9 g/dL (ref 12.0–15.0)
Lymphocytes Relative: 31 % (ref 12–46)
Lymphs Abs: 2.8 10*3/uL (ref 0.7–4.0)
MCH: 29.4 pg (ref 26.0–34.0)
MCHC: 32.4 g/dL (ref 30.0–36.0)
MCV: 90.7 fL (ref 78.0–100.0)
MONO ABS: 0.8 10*3/uL (ref 0.1–1.0)
Monocytes Relative: 9 % (ref 3–12)
Neutro Abs: 5.1 10*3/uL (ref 1.7–7.7)
Neutrophils Relative %: 56 % (ref 43–77)
Platelets: 276 10*3/uL (ref 150–400)
RBC: 4.39 MIL/uL (ref 3.87–5.11)
RDW: 14.6 % (ref 11.5–15.5)
WBC: 9.1 10*3/uL (ref 4.0–10.5)

## 2014-05-17 LAB — BASIC METABOLIC PANEL
Anion gap: 10 (ref 5–15)
BUN: 15 mg/dL (ref 6–20)
CO2: 28 mmol/L (ref 22–32)
Calcium: 10.2 mg/dL (ref 8.9–10.3)
Chloride: 96 mmol/L — ABNORMAL LOW (ref 101–111)
Creatinine, Ser: 0.65 mg/dL (ref 0.44–1.00)
GFR calc Af Amer: >60 mL/min (ref >60)
Glucose, Bld: 111 mg/dL — ABNORMAL HIGH (ref 70–99)
Potassium: 3.9 mmol/L (ref 3.5–5.1)
SODIUM: 134 mmol/L — AB (ref 135–145)

## 2014-05-17 LAB — URINALYSIS, ROUTINE W REFLEX MICROSCOPIC
Bilirubin Urine: NEGATIVE
Glucose, UA: NEGATIVE mg/dL
Hgb urine dipstick: NEGATIVE
Ketones, ur: NEGATIVE mg/dL
NITRITE: NEGATIVE
PH: 6.5 (ref 5.0–8.0)
Protein, ur: NEGATIVE mg/dL
SPECIFIC GRAVITY, URINE: 1.009 (ref 1.005–1.030)
Urobilinogen, UA: 0.2 mg/dL (ref 0.0–1.0)

## 2014-05-17 LAB — URINE MICROSCOPIC-ADD ON

## 2014-05-17 LAB — PROTIME-INR
INR: 0.96 (ref 0.00–1.49)
PROTHROMBIN TIME: 12.9 s (ref 11.6–15.2)

## 2014-05-17 LAB — APTT: APTT: 28 s (ref 24–37)

## 2014-05-17 NOTE — Pre-Procedure Instructions (Signed)
Cassandra Glenn  05/17/2014   Your procedure is scheduled on:  May 17  Report to [facility] Admitting at 530 AM.  Call this number if you have problems the morning of surgery: [phone]   Remember:   Do not eat food or drink liquids after midnight.   Take these medicines the morning of surgery with A SIP OF WATER: Venlafaxine (Effexor), and tylenol if needed  Stop taking Aspirin, Ibuprofen Aleve, BC's, Goody's, herbal medications, Fish Oil, Glucosamine, Probiotic   Do not wear jewelry, make-up or nail polish.  Do not wear lotions, powders, or perfumes. You may wear deodorant.  Do not shave 48 hours prior to surgery. Men may shave face and neck.  Do not bring valuables to the hospital.  [facility] is not responsible  for any belongings or valuables.               Contacts, dentures or bridgework may not be worn into surgery.  Leave suitcase in the car. After surgery it may be brought to your room.  For patients admitted to the hospital, discharge time is determined by your treatment team.               Patients discharged the day of surgery will not be allowed to drive home.    Special Instructions: [place] - Preparing for Surgery  Before surgery, you can play an important role.  Because skin is not sterile, your skin needs to be as free of germs as possible.  You can reduce the number of germs on you skin by washing with CHG (chlorahexidine gluconate) soap before surgery.  CHG is an antiseptic cleaner which kills germs and bonds with the skin to continue killing germs even after washing.  Please DO NOT use if you have an allergy to CHG or antibacterial soaps.  If your skin becomes reddened/irritated stop using the CHG and inform your nurse when you arrive at Short Stay.  Do not shave (including legs and underarms) for at least 48 hours prior to the first CHG shower.  You may shave your face.  Please follow these instructions carefully:   1.  Shower with CHG Soap  the night before surgery and the                                morning of Surgery.  2.  If you choose to wash your hair, wash your hair first as usual with your       normal shampoo.  3.  After you shampoo, rinse your hair and body thoroughly to remove the                      Shampoo.  4.  Use CHG as you would any other liquid soap.  You can apply chg directly       to the skin and wash gently with scrungie or a clean washcloth.  5.  Apply the CHG Soap to your body ONLY FROM THE NECK DOWN.        Do not use on open wounds or open sores.  Avoid contact with your eyes,       ears, mouth and genitals (private parts).  Wash genitals (private parts)       with your normal soap.  6.  Wash thoroughly, paying special attention to the area where your surgery  will be performed.  7.  Thoroughly rinse your body with warm water from the neck down.  8.  DO NOT shower/wash with your normal soap after using and rinsing off       the CHG Soap.  9.  Pat yourself dry with a clean towel.            10.  Wear clean pajamas.            11.  Place clean sheets on your bed the night of your first shower and do not        sleep with pets.  Day of Surgery  Do not apply any lotions/deoderants the morning of surgery.  Please wear clean clothes to the hospital/surgery center.      Please read over the following fact sheets that you were given: Pain Booklet, Coughing and Deep Breathing, Blood Transfusion Information, MRSA Information and Surgical Site Infection Prevention

## 2014-05-17 NOTE — Progress Notes (Signed)
PCP is Dr Jeanmarie Hubert Denies seeing a cardiologist. Denies ever having a card cath, stress test, or echo. EKG and CXR noted in epic from 10-16-13 Pt lives at [facility].

## 2014-05-20 ENCOUNTER — Other Ambulatory Visit: Payer: Self-pay | Admitting: Internal Medicine

## 2014-05-20 NOTE — Progress Notes (Addendum)
Anesthesia Chart Review:  Pt is 81 year old female scheduled for R conversion total hip arthroplasty for hip nonunion on 05/28/2014 with Dr. Rhona Raider.   PCP is Dr. Jeanmarie Hubert with [facility] includes: impaired fasting glucose, RBBB, macular degeneration, hard of hearing. Never smoker. BMI 26. S/p R cannulated hip pinning 03/08/11.   Pt seen in ED 10/16/2013 for chest pain. Troponin negative. D-Dimer negative. Determined to be pleurisy.   Preoperative labs reviewed.  Few bacteria and 11-20 WBC per hpf on urine microscopy. Notified Juliann Pulse in Dr. Jerald Kief office.   Chest x-ray 10/16/2013 reviewed. Moderate cardiomegaly. Hyperaeration. Pleural parenchymal scarring at the lung apices is stable. Right upper lobe nodular density adjacent to the right axilla is stable. No pneumothorax. Normal vascularity. Severe lower thoracic compression fracture is stable. No active cardiopulmonary disease.  EKG 10/16/2013: Sinus rhythm. RBBB. Borderline ST elevation, lateral leads.   Pt has medical clearance from Dr. Nyoka Cowden in [place] note dated 04/16/2014.   If no changes, I anticipate pt can proceed with surgery as scheduled.   Willeen Cass, FNP-BC [facility]/Anesthesiology Phone: [phone] 05/20/2014 3:58 PM

## 2014-05-22 ENCOUNTER — Other Ambulatory Visit: Payer: Self-pay

## 2014-05-22 ENCOUNTER — Emergency Department (HOSPITAL_COMMUNITY): Payer: Medicare Other

## 2014-05-22 ENCOUNTER — Emergency Department (HOSPITAL_COMMUNITY)
Admission: EM | Admit: 2014-05-22 | Discharge: 2014-05-22 | Disposition: A | Discharge status: Home / Self Care | Payer: Medicare Other | Attending: Emergency Medicine | Admitting: Emergency Medicine

## 2014-05-22 ENCOUNTER — Encounter (HOSPITAL_COMMUNITY): Payer: Self-pay

## 2014-05-22 DIAGNOSIS — S0012XA Contusion of left eyelid and periocular area, initial encounter: Secondary | ICD-10-CM | POA: Diagnosis not present

## 2014-05-22 DIAGNOSIS — Z79899 Other long term (current) drug therapy: Secondary | ICD-10-CM | POA: Insufficient documentation

## 2014-05-22 DIAGNOSIS — Y998 Other external cause status: Secondary | ICD-10-CM | POA: Diagnosis not present

## 2014-05-22 DIAGNOSIS — Z8719 Personal history of other diseases of the digestive system: Secondary | ICD-10-CM | POA: Insufficient documentation

## 2014-05-22 DIAGNOSIS — Z8619 Personal history of other infectious and parasitic diseases: Secondary | ICD-10-CM | POA: Diagnosis not present

## 2014-05-22 DIAGNOSIS — Z87448 Personal history of other diseases of urinary system: Secondary | ICD-10-CM | POA: Insufficient documentation

## 2014-05-22 DIAGNOSIS — Z8679 Personal history of other diseases of the circulatory system: Secondary | ICD-10-CM | POA: Insufficient documentation

## 2014-05-22 DIAGNOSIS — Z8742 Personal history of other diseases of the female genital tract: Secondary | ICD-10-CM | POA: Diagnosis not present

## 2014-05-22 DIAGNOSIS — Z9841 Cataract extraction status, right eye: Secondary | ICD-10-CM | POA: Insufficient documentation

## 2014-05-22 DIAGNOSIS — S0003XA Contusion of scalp, initial encounter: Secondary | ICD-10-CM | POA: Diagnosis not present

## 2014-05-22 DIAGNOSIS — Z7982 Long term (current) use of aspirin: Secondary | ICD-10-CM | POA: Insufficient documentation

## 2014-05-22 DIAGNOSIS — I4510 Unspecified right bundle-branch block: Secondary | ICD-10-CM | POA: Diagnosis not present

## 2014-05-22 DIAGNOSIS — S199XXA Unspecified injury of neck, initial encounter: Secondary | ICD-10-CM | POA: Diagnosis not present

## 2014-05-22 DIAGNOSIS — Y92002 Bathroom of unspecified non-institutional (private) residence single-family (private) house as the place of occurrence of the external cause: Secondary | ICD-10-CM | POA: Insufficient documentation

## 2014-05-22 DIAGNOSIS — R42 Dizziness and giddiness: Secondary | ICD-10-CM | POA: Diagnosis not present

## 2014-05-22 DIAGNOSIS — Z872 Personal history of diseases of the skin and subcutaneous tissue: Secondary | ICD-10-CM | POA: Insufficient documentation

## 2014-05-22 DIAGNOSIS — M25551 Pain in right hip: Secondary | ICD-10-CM | POA: Diagnosis not present

## 2014-05-22 DIAGNOSIS — Z9842 Cataract extraction status, left eye: Secondary | ICD-10-CM | POA: Insufficient documentation

## 2014-05-22 DIAGNOSIS — S90512A Abrasion, left ankle, initial encounter: Secondary | ICD-10-CM | POA: Insufficient documentation

## 2014-05-22 DIAGNOSIS — Z853 Personal history of malignant neoplasm of breast: Secondary | ICD-10-CM | POA: Insufficient documentation

## 2014-05-22 DIAGNOSIS — S90511A Abrasion, right ankle, initial encounter: Secondary | ICD-10-CM | POA: Insufficient documentation

## 2014-05-22 DIAGNOSIS — S0990XA Unspecified injury of head, initial encounter: Secondary | ICD-10-CM | POA: Diagnosis not present

## 2014-05-22 DIAGNOSIS — T148 Other injury of unspecified body region: Secondary | ICD-10-CM | POA: Diagnosis not present

## 2014-05-22 DIAGNOSIS — Y9389 Activity, other specified: Secondary | ICD-10-CM | POA: Insufficient documentation

## 2014-05-22 DIAGNOSIS — W19XXXA Unspecified fall, initial encounter: Secondary | ICD-10-CM

## 2014-05-22 DIAGNOSIS — M810 Age-related osteoporosis without current pathological fracture: Secondary | ICD-10-CM | POA: Diagnosis not present

## 2014-05-22 DIAGNOSIS — H9190 Unspecified hearing loss, unspecified ear: Secondary | ICD-10-CM | POA: Diagnosis not present

## 2014-05-22 DIAGNOSIS — G4700 Insomnia, unspecified: Secondary | ICD-10-CM | POA: Diagnosis not present

## 2014-05-22 DIAGNOSIS — S0512XA Contusion of eyeball and orbital tissues, left eye, initial encounter: Secondary | ICD-10-CM

## 2014-05-22 DIAGNOSIS — M1990 Unspecified osteoarthritis, unspecified site: Secondary | ICD-10-CM | POA: Insufficient documentation

## 2014-05-22 DIAGNOSIS — W01198A Fall on same level from slipping, tripping and stumbling with subsequent striking against other object, initial encounter: Secondary | ICD-10-CM | POA: Diagnosis not present

## 2014-05-22 DIAGNOSIS — R079 Chest pain, unspecified: Secondary | ICD-10-CM | POA: Diagnosis not present

## 2014-05-22 LAB — CBC WITH DIFFERENTIAL/PLATELET
Basophils Absolute: 0 10*3/uL (ref 0.0–0.1)
Basophils Relative: 0 % (ref 0–1)
EOS PCT: 2 % (ref 0–5)
Eosinophils Absolute: 0.1 10*3/uL (ref 0.0–0.7)
HEMATOCRIT: 39.5 % (ref 36.0–46.0)
Hemoglobin: 12.8 g/dL (ref 12.0–15.0)
LYMPHS ABS: 1.3 10*3/uL (ref 0.7–4.0)
LYMPHS PCT: 16 % (ref 12–46)
MCH: 29.2 pg (ref 26.0–34.0)
MCHC: 32.4 g/dL (ref 30.0–36.0)
MCV: 90 fL (ref 78.0–100.0)
MONO ABS: 0.6 10*3/uL (ref 0.1–1.0)
Monocytes Relative: 8 % (ref 3–12)
NEUTROS ABS: 6.2 10*3/uL (ref 1.7–7.7)
Neutrophils Relative %: 74 % (ref 43–77)
Platelets: 236 10*3/uL (ref 150–400)
RBC: 4.39 MIL/uL (ref 3.87–5.11)
RDW: 14.2 % (ref 11.5–15.5)
WBC: 8.3 10*3/uL (ref 4.0–10.5)

## 2014-05-22 LAB — URINE MICROSCOPIC-ADD ON

## 2014-05-22 LAB — URINALYSIS, ROUTINE W REFLEX MICROSCOPIC
Bilirubin Urine: NEGATIVE
GLUCOSE, UA: NEGATIVE mg/dL
HGB URINE DIPSTICK: NEGATIVE
Ketones, ur: NEGATIVE mg/dL
Nitrite: NEGATIVE
PH: 7.5 (ref 5.0–8.0)
PROTEIN: NEGATIVE mg/dL
Specific Gravity, Urine: 1.009 (ref 1.005–1.030)
Urobilinogen, UA: 0.2 mg/dL (ref 0.0–1.0)

## 2014-05-22 LAB — BASIC METABOLIC PANEL
Anion gap: 8 (ref 5–15)
BUN: 8 mg/dL (ref 6–20)
CALCIUM: 9.7 mg/dL (ref 8.9–10.3)
CO2: 27 mmol/L (ref 22–32)
CREATININE: 0.68 mg/dL (ref 0.44–1.00)
Chloride: 99 mmol/L — ABNORMAL LOW (ref 101–111)
GFR calc Af Amer: >60 mL/min (ref >60)
Glucose, Bld: 124 mg/dL — ABNORMAL HIGH (ref 70–99)
Potassium: 4 mmol/L (ref 3.5–5.1)
SODIUM: 134 mmol/L — AB (ref 135–145)

## 2014-05-22 LAB — I-STAT TROPONIN, ED: Troponin i, poc: 0 ng/mL (ref 0.00–0.08)

## 2014-05-22 NOTE — ED Notes (Signed)
Attempted to call report to [facility]. 

## 2014-05-22 NOTE — ED Notes (Signed)
PA at bedside.

## 2014-05-22 NOTE — Discharge Instructions (Signed)
Return to the emergency room with worsening of symptoms, new symptoms or with symptoms that are concerning , especially severe worsening of headache, visual or speech changes, weakness in face, arms or legs. RICE: Rest, Ice (three cycles of 20 mins on, 58mins off at least twice a day), compression/brace, elevation. Heating pad works well for back pain. Use your walker at all times. Please call your doctor for a followup appointment within 24-48 hours. When you talk to your doctor please let them know that you were seen in the emergency department and have them acquire all of your records so that they can discuss the findings with you and formulate a treatment plan to fully care for your new and ongoing problems.  Read below information and follow recommendations.  Contusion A contusion is a deep bruise. Contusions are the result of an injury that caused bleeding under the skin. The contusion may turn blue, purple, or yellow. Minor injuries will give you a painless contusion, but more severe contusions may stay painful and swollen for a few weeks.  CAUSES  A contusion is usually caused by a blow, trauma, or direct force to an area of the body. SYMPTOMS   Swelling and redness of the injured area.  Bruising of the injured area.  Tenderness and soreness of the injured area.  Pain. DIAGNOSIS  The diagnosis can be made by taking a history and physical exam. An X-ray, CT scan, or MRI may be needed to determine if there were any associated injuries, such as fractures. TREATMENT  Specific treatment will depend on what area of the body was injured. In general, the best treatment for a contusion is resting, icing, elevating, and applying cold compresses to the injured area. Over-the-counter medicines may also be recommended for pain control. Ask your caregiver what the best treatment is for your contusion. HOME CARE INSTRUCTIONS   Put ice on the injured area.  Put ice in a plastic bag.  Place a  towel between your skin and the bag.  Leave the ice on for 15-20 minutes, 3-4 times a day, or as directed by your health care provider.  Only take over-the-counter or prescription medicines for pain, discomfort, or fever as directed by your caregiver. Your caregiver may recommend avoiding anti-inflammatory medicines (aspirin, ibuprofen, and naproxen) for 48 hours because these medicines may increase bruising.  Rest the injured area.  If possible, elevate the injured area to reduce swelling. SEEK IMMEDIATE MEDICAL CARE IF:   You have increased bruising or swelling.  You have pain that is getting worse.  Your swelling or pain is not relieved with medicines. MAKE SURE YOU:   Understand these instructions.  Will watch your condition.  Will get help right away if you are not doing well or get worse. Document Released: 10/07/2004 Document Revised: 01/02/2013 Document Reviewed: 11/02/2010 [facility] Patient Information 2015 [place], [place]. This information is not intended to replace advice given to you by your health care provider. Make sure you discuss any questions you have with your health care provider.  Fall Prevention and Home Safety Falls cause injuries and can affect all age groups. It is possible to use preventive measures to significantly decrease the likelihood of falls. There are many simple measures which can make your home safer and prevent falls. OUTDOORS  Repair cracks and edges of walkways and driveways.  Remove high doorway thresholds.  Trim shrubbery on the main path into your home.  Have good outside lighting.  Clear walkways of tools, rocks, debris,  and clutter.  Check that handrails are not broken and are securely fastened. Both sides of steps should have handrails.  Have leaves, snow, and ice cleared regularly.  Use sand or salt on walkways during winter months.  In the garage, clean up grease or oil spills. BATHROOM  Install night lights.  Install  grab bars by the toilet and in the tub and shower.  Use non-skid mats or decals in the tub or shower.  Place a plastic non-slip stool in the shower to sit on, if needed.  Keep floors dry and clean up all water on the floor immediately.  Remove soap buildup in the tub or shower on a regular basis.  Secure bath mats with non-slip, double-sided rug tape.  Remove throw rugs and tripping hazards from the floors. BEDROOMS  Install night lights.  Make sure a bedside light is easy to reach.  Do not use oversized bedding.  Keep a telephone by your bedside.  Have a firm chair with side arms to use for getting dressed.  Remove throw rugs and tripping hazards from the floor. KITCHEN  Keep handles on pots and pans turned toward the center of the stove. Use back burners when possible.  Clean up spills quickly and allow time for drying.  Avoid walking on wet floors.  Avoid hot utensils and knives.  Position shelves so they are not too high or low.  Place commonly used objects within easy reach.  If necessary, use a sturdy step stool with a grab bar when reaching.  Keep electrical cables out of the way.  Do not use floor polish or wax that makes floors slippery. If you must use wax, use non-skid floor wax.  Remove throw rugs and tripping hazards from the floor. STAIRWAYS  Never leave objects on stairs.  Place handrails on both sides of stairways and use them. Fix any loose handrails. Make sure handrails on both sides of the stairways are as long as the stairs.  Check carpeting to make sure it is firmly attached along stairs. Make repairs to worn or loose carpet promptly.  Avoid placing throw rugs at the top or bottom of stairways, or properly secure the rug with carpet tape to prevent slippage. Get rid of throw rugs, if possible.  Have an electrician put in a light switch at the top and bottom of the stairs. OTHER FALL PREVENTION TIPS  Wear low-heel or rubber-soled shoes  that are supportive and fit well. Wear closed toe shoes.  When using a stepladder, make sure it is fully opened and both spreaders are firmly locked. Do not climb a closed stepladder.  Add color or contrast paint or tape to grab bars and handrails in your home. Place contrasting color strips on first and last steps.  Learn and use mobility aids as needed. Install an electrical emergency response system.  Turn on lights to avoid dark areas. Replace light bulbs that burn out immediately. Get light switches that glow.  Arrange furniture to create clear pathways. Keep furniture in the same place.  Firmly attach carpet with non-skid or double-sided tape.  Eliminate uneven floor surfaces.  Select a carpet pattern that does not visually hide the edge of steps.  Be aware of all pets. OTHER HOME SAFETY TIPS  Set the water temperature for 120 F (48.8 C).  Keep emergency numbers on or near the telephone.  Keep smoke detectors on every level of the home and near sleeping areas. Document Released: 12/18/2001 Document Revised: 06/29/2011 Document  Reviewed: 03/19/2011 [employer] Patient Information 2015 [place], [place]. This information is not intended to replace advice given to you by your health care provider. Make sure you discuss any questions you have with your health care provider.

## 2014-05-22 NOTE — ED Provider Notes (Signed)
CSN: [id number]     Arrival date & time 05/22/14  1109 History   First MD Initiated Contact with Patient 05/22/14 1122     Chief Complaint  Patient presents with  . Fall  . Dizziness     (Consider location/radiation/quality/duration/timing/severity/associated sxs/prior Treatment) HPI  Cassandra Glenn is a 81 y.o. female presenting with fall today. Patient lives in nursing facility in [facility]. Patient stated she was using the restroom and was squatting and felt lightheaded "nervous or on fire" with some bilateral numbness tingling and fell hitting the left side of her head. She denies any loss of consciousness. No anticoagulants. Patient denies any visual changes, slurred speech, weakness. No nausea or vomiting. Patient denies pain other than bilateral ankles. No headache   Past Medical History  Diagnosis Date  . Macular degeneration   . HOH (hard of hearing)   . Vaginitis and vulvovaginitis 02/29/2012  . Abnormalities of the hair 02/29/2012  . Spinal stenosis, lumbar region, with neurogenic claudication 11/02/2011  . Trigger finger (acquired) 11/02/2011  . Right bundle branch block 08/30/2011  . Edema 05/07/2011  . Contact dermatitis and other eczema, due to unspecified cause 05/07/2011  . Hypopotassemia 03/26/2011  . Candidiasis of skin and nails 03/19/2011  . Hyposmolality and/or hyponatremia 03/19/2011  . Diverticulosis of colon (without mention of hemorrhage) 03/19/2011  . Unspecified constipation 03/19/2011  . Pain in joint, pelvic region and thigh 03/19/2011  . Lumbago 03/19/2011  . Spasm of muscle 03/19/2011  . Osteoporosis, unspecified 03/19/2011  . Insomnia, unspecified 03/19/2011  . Other malaise and fatigue 03/19/2011  . Impaired fasting glucose 03/19/2011  . Hypotension, unspecified 03/18/2011  . Closed fracture of base of neck of femur 03/18/2011  . Arthritis   . Shortness of breath   . Kidney infection   . Cancer   . Malignant  neoplasm of breast (female), unspecified site 10/28/2002   Past Surgical History  Procedure Laterality Date  . Cataract extraction  2010    bialteral  . Hip pinning,cannulated  03/08/2011    Procedure: CANNULATED HIP PINNING;  Surgeon: Johnn Hai, MD;  Location: [facility];  Service: Orthopedics;  Laterality: Right;  . Eye surgery      cataract  . Fracture surgery Left 09/1980    ankle  . Breast surgery Bilateral 2005-10/27/2004    lumpectomy- Streck,MD  . Squamous cell carcinoma excision Right 02/04/2011    forearm- Taffeen, MD  . Orif hip fracture Right 03/08/2011    Bean,MD  . Skin lesion excision Right 02/04/2011    Abdomen lesion spongiotic dermatitis-Taffeen, MD  . Skin cancer excision Left 10/12/2012    lower leg Dr. [place]  . Tonsillectomy     Family History  Problem Relation Age of Onset  . Heart disease Mother   . Cancer Father   . Emphysema Brother   . Alzheimer's disease Brother    History  Substance Use Topics  . Smoking status: Never Smoker   . Smokeless tobacco: Never Used  . Alcohol Use: 0.0 oz/week    0 Glasses of wine per week     Comment: occasional glass   OB History    No data available     Review of Systems 10 Systems reviewed and are negative for acute change except as noted in the HPI.    Allergies  Macrodantin and Septra  Home Medications   Prior to Admission medications   Medication Sig Start Date End Date Taking? Authorizing Provider  acetaminophen (TYLENOL) 500 MG tablet Take 1,000 mg by mouth every 6 (six) hours as needed for moderate pain.    Yes Historical Provider, MD  aspirin EC 81 MG tablet Take 81 mg by mouth daily.   Yes Historical Provider, MD  Bilberry 1000 MG CAPS Take 1,000 mg by mouth daily.    Yes Historical Provider, MD  Calcium Carbonate-Vitamin D (CALCIUM 600/VITAMIN D PO) Take 600 mg by mouth 2 (two) times daily.    Yes Historical Provider, MD  Calcium Polycarbophil (FIBERCON PO) Take 1 tablet by mouth 2 (two)  times daily.    Yes Historical Provider, MD  cholecalciferol (VITAMIN D) 400 UNITS TABS Take 400 Units by mouth daily.    Yes Historical Provider, MD  CRANBERRY EXTRACT PO Take 1 tablet by mouth daily.   Yes Historical Provider, MD  fish oil-omega-3 fatty acids 1000 MG capsule Take 1 g by mouth daily.    Yes Historical Provider, MD  Glucosamine-Chondroitin (GLUCOSAMINE CHONDR COMPLEX PO) Take 1 tablet by mouth 2 (two) times daily. for joints   Yes Historical Provider, MD  mirabegron ER (MYRBETRIQ) 25 MG TB24 tablet Take 25 mg by mouth daily.   Yes Historical Provider, MD  Multiple Vitamins-Minerals (PRESERVISION/LUTEIN) CAPS Take 1 capsule by mouth 2 (two) times daily.   Yes Historical Provider, MD  nitrofurantoin (MACRODANTIN) 50 MG capsule Take 50 mg by mouth at bedtime. Prophylaxis course (UTI's) 42 day course 04/26/14   Yes Historical Provider, MD  Probiotic Product (RESTORA) CAPS Take 1 capsule by mouth daily.   Yes Historical Provider, MD  venlafaxine (EFFEXOR) 75 MG tablet TAKE 1 TABLET ONCE DAILY FOR DEPRESSION. 05/20/14  Yes Estill Dooms, MD  zolpidem (AMBIEN) 5 MG tablet 1/2 by mouth daily at bedtime as needed for rest Patient taking differently: Take 2.5 mg by mouth at bedtime as needed for sleep.  03/21/14  Yes Estill Dooms, MD   BP 146/70 mmHg  Pulse 82  Temp(Src) 97.7 F (36.5 C) (Oral)  Resp 14  SpO2 97% Physical Exam  Constitutional: She appears well-developed and well-nourished. No distress.  HENT:  Head: Normocephalic.  Mouth/Throat: Oropharynx is clear and moist.  Multiple small S1 centimeter lacerations to left outer eyelid with hematoma. Extraocular movements intact without discomfort.  Eyes: Conjunctivae and EOM are normal. Pupils are equal, round, and reactive to light. Right eye exhibits no discharge. Left eye exhibits no discharge.  Neck:  Patient in c-collar.  Cardiovascular: Normal rate and regular rhythm.   Pulmonary/Chest: Effort normal and breath sounds  normal. No respiratory distress. She has no wheezes.  Abdominal: Soft. Bowel sounds are normal. She exhibits no distension. There is no tenderness.  Musculoskeletal:  No significant spine tenderness step-off or crepitus. No back tenderness.  Small abrasions to bilateral outer distal ankles  Neurological: She is alert. No cranial nerve deficit. She exhibits normal muscle tone. Coordination normal.  Speech is clear and goal oriented.  Strength 5/5 in upper and lower extremities. Sensation intact. Intact rapid alternating movements, finger to nose, and heel to shin. No pronator drift. Normal steady gait with walker without significant discomfort.   Skin: Skin is warm and dry. She is not diaphoretic.  Nursing note and vitals reviewed.   ED Course  Procedures (including critical care time) Labs Review Labs Reviewed  BASIC METABOLIC PANEL - Abnormal; Notable for the following:    Sodium 134 (*)    Chloride 99 (*)    Glucose, Bld 124 (*)  All other components within normal limits  URINALYSIS, ROUTINE W REFLEX MICROSCOPIC - Abnormal; Notable for the following:    Leukocytes, UA SMALL (*)    All other components within normal limits  URINE MICROSCOPIC-ADD ON - Abnormal; Notable for the following:    Bacteria, UA FEW (*)    All other components within normal limits  URINE CULTURE  CBC WITH DIFFERENTIAL/PLATELET  Randolm Idol, ED    Imaging Review Dg Chest 1 View  05/22/2014   CLINICAL DATA:  Recent fall with chest pain  EXAM: CHEST  1 VIEW  COMPARISON:  10/16/2013  FINDINGS: Cardiac shadow is within normal limits. The lungs are well aerated bilaterally. Some stable scarring is noted in the apices bilaterally. No focal infiltrate or sizable effusion is seen. Bony structures are within normal limits.  IMPRESSION: Chronic apical changes without acute abnormality.   Electronically Signed   By: Inez Catalina M.D.   On: 05/22/2014 14:07   Ct Head Wo Contrast  05/22/2014   CLINICAL DATA:   Scalp hematoma after dizziness and fall at nursing facility.  EXAM: CT HEAD WITHOUT CONTRAST  CT CERVICAL SPINE WITHOUT CONTRAST  TECHNIQUE: Multidetector CT imaging of the head and cervical spine was performed following the standard protocol without intravenous contrast. Multiplanar CT image reconstructions of the cervical spine were also generated.  COMPARISON:  CT scan of head of November 19, 2004.  FINDINGS: CT HEAD FINDINGS  Bony calvarium appears intact. Mild left frontal scalp hematoma is noted. Mild diffuse cortical atrophy is noted. Mild chronic ischemic white matter disease is noted. No mass effect or midline shift is noted. Ventricular size is within normal limits. There is no evidence of mass lesion, hemorrhage or acute infarction.  CT CERVICAL SPINE FINDINGS  No fracture is noted. Minimal grade 1 retrolisthesis of C3-4 is noted secondary to severe degenerative disc disease at this level. Severe degenerative disc disease is also noted at C4-5 and C5-6. Moderate degenerative disc disease is noted at C6-7 with posterior osteophyte formation. Mild degenerative changes seen involving posterior facet joints of C7-T1. Biapical scarring is noted.  IMPRESSION: Mild left frontal scalp hematoma. Mild diffuse cortical atrophy. Mild chronic ischemic white matter disease. No acute intracranial abnormality seen.  Multilevel degenerative disc disease. No acute abnormality seen in the cervical spine.   Electronically Signed   By: Marijo Conception, M.D.   On: 05/22/2014 13:55   Ct Cervical Spine Wo Contrast  05/22/2014   CLINICAL DATA:  Scalp hematoma after dizziness and fall at nursing facility.  EXAM: CT HEAD WITHOUT CONTRAST  CT CERVICAL SPINE WITHOUT CONTRAST  TECHNIQUE: Multidetector CT imaging of the head and cervical spine was performed following the standard protocol without intravenous contrast. Multiplanar CT image reconstructions of the cervical spine were also generated.  COMPARISON:  CT scan of head of  November 19, 2004.  FINDINGS: CT HEAD FINDINGS  Bony calvarium appears intact. Mild left frontal scalp hematoma is noted. Mild diffuse cortical atrophy is noted. Mild chronic ischemic white matter disease is noted. No mass effect or midline shift is noted. Ventricular size is within normal limits. There is no evidence of mass lesion, hemorrhage or acute infarction.  CT CERVICAL SPINE FINDINGS  No fracture is noted. Minimal grade 1 retrolisthesis of C3-4 is noted secondary to severe degenerative disc disease at this level. Severe degenerative disc disease is also noted at C4-5 and C5-6. Moderate degenerative disc disease is noted at C6-7 with posterior osteophyte formation. Mild degenerative changes seen  involving posterior facet joints of C7-T1. Biapical scarring is noted.  IMPRESSION: Mild left frontal scalp hematoma. Mild diffuse cortical atrophy. Mild chronic ischemic white matter disease. No acute intracranial abnormality seen.  Multilevel degenerative disc disease. No acute abnormality seen in the cervical spine.   Electronically Signed   By: Marijo Conception, M.D.   On: 05/22/2014 13:55     EKG Interpretation   Date/Time:  Wednesday May 22 2014 12:47:10 EDT Ventricular Rate:  81 PR Interval:  176 QRS Duration: 127 QT Interval:  404 QTC Calculation: 469 R Axis:   75 Text Interpretation:  Sinus rhythm Right bundle branch block ED PHYSICIAN  INTERPRETATION AVAILABLE IN [facility] Confirmed by TEST, Record  ([id number]) on 05/24/2014 7:20:33 AM      MDM   Final diagnoses:  Fall, initial encounter  Head injury, initial encounter  Contusion, eye, left, initial encounter   Pt presenting after fall with lightheadedness. VSS. Pt denies any headache, visual changes, weakness. No confusion. Family member bedside and confirms normal mentation. Neurological exam without deficits. Minor lacerations to left outer eyelid with hematoma. These were irrigated and repaired with steri strips. Head and neck  ct without acute abnormalities. Pt c-collar removed and she had 45 degrees range of motion without pain. C-spine cleared. EKG without significant change from prior. Pt refused ankle x rays. UA with evidence of infection. Pt denies symptoms and states she has been treated for UTI. No further abx indicated. Will culture. Pt denies any dizziness at this time. No further complaints. Dizziness likely related to postural changes. Pt discharged to [facility] and stable condition.  Discussed return precautions with patient. Discussed all results and patient verbalizes understanding and agrees with plan.  This is a shared patient. This patient was discussed with the physician who saw and evaluated the patient and agrees with the plan.      Al Corpus, PA-C 05/24/14 0730  Quintella Reichert, MD 05/24/14 1214

## 2014-05-22 NOTE — ED Notes (Signed)
[name] from radiology called to let us know that she went to xray the patient's ankles but the patient declined it d/t her ankles not hurting.

## 2014-05-22 NOTE — ED Notes (Signed)
[facility]- pt coming from assisted living after a fall. Pt was using the restroom and trying to stand up and had a dizzy spell and tried to stand and fell face forward. Denies LOC. Hematoma noted over left eye and right hip pain reported. Pt scheduled to have right hip replaced next Tuesday.  A&O X4

## 2014-05-23 LAB — URINE CULTURE

## 2014-05-27 DIAGNOSIS — M25551 Pain in right hip: Secondary | ICD-10-CM | POA: Diagnosis not present

## 2014-05-27 MED ORDER — LACTATED RINGERS IV SOLN: INTRAVENOUS | Status: DC

## 2014-05-27 MED ORDER — CEFAZOLIN SODIUM-DEXTROSE 2-3 GM-% IV SOLR
2.0000 g | INTRAVENOUS | Status: AC
  Administered 2014-05-28: 2 g via INTRAVENOUS

## 2014-05-27 NOTE — H&P (Signed)
TOTAL HIP ADMISSION H&P  Patient is admitted for right total hip arthroplasty.  Subjective:  Chief Complaint: right hip pain  HPI: Cassandra Glenn, 81 y.o. female, has a history of pain and functional disability in the right hip(s) due to arthritis and patient has failed non-surgical conservative treatments for greater than 12 weeks to include NSAID's and/or analgesics, flexibility and strengthening excercises, use of assistive devices and activity modification.  Onset of symptoms was gradual starting 3 years ago with gradually worsening course since that time.The patient noted prior procedures of the hip to include hip pinning to thousand 13 on the right hip(s).  Patient currently rates pain in the right hip at 10 out of 10 with activity. Patient has night pain, worsening of pain with activity and weight bearing, trendelenberg gait, pain that interfers with activities of daily living and pain with passive range of motion. Patient has evidence of subchondral cysts, subchondral sclerosis, periarticular osteophytes and joint space narrowing by imaging studies. This condition presents safety issues increasing the risk of falls. This patient has had 2013.  Hip pinning.  There is no current active infection.  Patient Active Problem List   Diagnosis Date Noted  . Cancer antigen 125 (CA 125) elevation 11/27/2013  . IBS (irritable bowel syndrome) 11/27/2013  . Purpura senilis 07/31/2013  . TMJ click 78/93/8101  . Insomnia 04/24/2013  . Urinary frequency 04/24/2013  . Hyponatremia 04/19/2013  . Hyperglycemia 04/19/2013  . History of fall 04/17/2013  . UTI (urinary tract infection) 04/16/2013  . Abnormal CT scan, chest 03/23/2013  . Compression fracture of thoracic vertebra 11/24/2012  . Closed fracture of base of neck of femur 03/18/2011  . Cancer of breast, female 10/27/2004   Past Medical History  Diagnosis Date  . Macular degeneration   . HOH (hard of hearing)   . Vaginitis and  vulvovaginitis 02/29/2012  . Abnormalities of the hair 02/29/2012  . Spinal stenosis, lumbar region, with neurogenic claudication 11/02/2011  . Trigger finger (acquired) 11/02/2011  . Right bundle branch block 08/30/2011  . Edema 05/07/2011  . Contact dermatitis and other eczema, due to unspecified cause 05/07/2011  . Hypopotassemia 03/26/2011  . Candidiasis of skin and nails 03/19/2011  . Hyposmolality and/or hyponatremia 03/19/2011  . Diverticulosis of colon (without mention of hemorrhage) 03/19/2011  . Unspecified constipation 03/19/2011  . Pain in joint, pelvic region and thigh 03/19/2011  . Lumbago 03/19/2011  . Spasm of muscle 03/19/2011  . Osteoporosis, unspecified 03/19/2011  . Insomnia, unspecified 03/19/2011  . Other malaise and fatigue 03/19/2011  . Impaired fasting glucose 03/19/2011  . Hypotension, unspecified 03/18/2011  . Closed fracture of base of neck of femur 03/18/2011  . Arthritis   . Shortness of breath   . Kidney infection   . Cancer   . Malignant neoplasm of breast (female), unspecified site 10/28/2002    Past Surgical History  Procedure Laterality Date  . Cataract extraction  2010    bialteral  . Hip pinning,cannulated  03/08/2011    Procedure: CANNULATED HIP PINNING;  Surgeon: Johnn Hai, MD;  Location: [facility];  Service: Orthopedics;  Laterality: Right;  . Eye surgery      cataract  . Fracture surgery Left 09/1980    ankle  . Breast surgery Bilateral 2005-10/27/2004    lumpectomy- Streck,MD  . Squamous cell carcinoma excision Right 02/04/2011    forearm- Taffeen, MD  . Orif hip fracture Right 03/08/2011    Bean,MD  . Skin lesion excision Right 02/04/2011  Abdomen lesion spongiotic dermatitis-Taffeen, MD  . Skin cancer excision Left 10/12/2012    lower leg Dr. [place]  . Tonsillectomy      No prescriptions prior to admission   Allergies  Allergen Reactions  . Macrodantin [Nitrofurantoin Macrocrystal] Other (See Comments)    Unknown  allergic reaction many years ago/ tolerating 50 mg macrodantin   . Septra [Sulfamethoxazole-Trimethoprim] Other (See Comments)    Unknown allergic reaction many years ago    History  Substance Use Topics  . Smoking status: Never Smoker   . Smokeless tobacco: Never Used  . Alcohol Use: 0.0 oz/week    0 Glasses of wine per week     Comment: occasional glass    Family History  Problem Relation Age of Onset  . Heart disease Mother   . Cancer Father   . Emphysema Brother   . Alzheimer's disease Brother      Review of Systems  Constitutional: Negative.   HENT: Negative.   Eyes: Negative.   Respiratory: Negative.   Cardiovascular: Negative.   Gastrointestinal: Negative.   Genitourinary: Negative.   Musculoskeletal: Positive for joint pain.  Skin: Negative.   Neurological: Negative.     Objective:  Physical Exam  Constitutional: She is oriented to person, place, and time. She appears well-nourished.  HENT:  Head: Atraumatic.  Eyes: Pupils are equal, round, and reactive to light.  Neck: Normal range of motion.  Cardiovascular: Normal rate.   Respiratory: Effort normal.  GI: Soft.  Musculoskeletal:  Severe pain right hip with ambulation.  Hip is a bit short on that same side.  Pain with rotation sensory motor function otherwise normal.  Previous hip pinning many years ago to thousand 13  Neurological: She is oriented to person, place, and time.  Skin: Skin is dry.  Psychiatric: She has a normal mood and affect.    Vital signs in last 24 hours:    Labs:   Estimated body mass index is 25.14 kg/(m^2) as calculated from the following:   Height as of 01/23/14: 5\' 1"  (1.549 m).   Weight as of 03/12/14: 60.328 kg (133 lb).   Imaging Review Plain radiographs demonstrate severe degenerative joint disease of the right hip(s). The bone quality appears to be fair for age and reported activity level.  Assessment/Plan:  End stagePrimary arthritis, right hip(s)  The patient  history, physical examination, clinical judgement of the provider and imaging studies are consistent with end stage degenerative joint disease of the right hip(s) and total hip arthroplasty is deemed medically necessary. The treatment options including medical management, injection therapy, arthroscopy and arthroplasty were discussed at length. The risks and benefits of total hip arthroplasty were presented and reviewed. The risks due to aseptic loosening, infection, stiffness, dislocation/subluxation,  thromboembolic complications and other imponderables were discussed.  The patient acknowledged the explanation, agreed to proceed with the plan and consent was signed. Patient is being admitted for inpatient treatment for surgery, pain control, PT, OT, prophylactic antibiotics, VTE prophylaxis, progressive ambulation and ADL's and discharge planning.The patient is planning to be discharged to skilled nursing facility

## 2014-05-28 ENCOUNTER — Inpatient Hospital Stay (HOSPITAL_COMMUNITY): Payer: Medicare Other | Admitting: Emergency Medicine

## 2014-05-28 ENCOUNTER — Inpatient Hospital Stay (HOSPITAL_COMMUNITY): Payer: Medicare Other | Admitting: Certified Registered Nurse Anesthetist

## 2014-05-28 ENCOUNTER — Encounter (HOSPITAL_COMMUNITY)
Admission: RE | Disposition: A | Discharge status: Skilled Nursing Facility | Payer: Self-pay | Source: Ambulatory Visit | Attending: Orthopaedic Surgery

## 2014-05-28 ENCOUNTER — Inpatient Hospital Stay (HOSPITAL_COMMUNITY)
Admission: RE | Admit: 2014-05-28 | Discharge: 2014-06-03 | DRG: 470 | Disposition: A | Discharge status: Skilled Nursing Facility | Payer: Medicare Other | Source: Ambulatory Visit | Attending: Orthopaedic Surgery | Admitting: Orthopaedic Surgery

## 2014-05-28 ENCOUNTER — Encounter (HOSPITAL_COMMUNITY): Payer: Self-pay | Admitting: *Deleted

## 2014-05-28 ENCOUNTER — Inpatient Hospital Stay (HOSPITAL_COMMUNITY): Payer: Medicare Other

## 2014-05-28 DIAGNOSIS — R339 Retention of urine, unspecified: Secondary | ICD-10-CM | POA: Diagnosis not present

## 2014-05-28 DIAGNOSIS — M1611 Unilateral primary osteoarthritis, right hip: Secondary | ICD-10-CM | POA: Diagnosis present

## 2014-05-28 DIAGNOSIS — T43215A Adverse effect of selective serotonin and norepinephrine reuptake inhibitors, initial encounter: Secondary | ICD-10-CM | POA: Diagnosis not present

## 2014-05-28 DIAGNOSIS — D62 Acute posthemorrhagic anemia: Secondary | ICD-10-CM | POA: Clinically undetermined

## 2014-05-28 DIAGNOSIS — R338 Other retention of urine: Secondary | ICD-10-CM | POA: Diagnosis not present

## 2014-05-28 DIAGNOSIS — Z79899 Other long term (current) drug therapy: Secondary | ICD-10-CM | POA: Diagnosis not present

## 2014-05-28 DIAGNOSIS — R1314 Dysphagia, pharyngoesophageal phase: Secondary | ICD-10-CM | POA: Diagnosis not present

## 2014-05-28 DIAGNOSIS — M1990 Unspecified osteoarthritis, unspecified site: Secondary | ICD-10-CM | POA: Diagnosis not present

## 2014-05-28 DIAGNOSIS — M4806 Spinal stenosis, lumbar region: Secondary | ICD-10-CM | POA: Diagnosis not present

## 2014-05-28 DIAGNOSIS — R262 Difficulty in walking, not elsewhere classified: Secondary | ICD-10-CM | POA: Diagnosis not present

## 2014-05-28 DIAGNOSIS — S72001K Fracture of unspecified part of neck of right femur, subsequent encounter for closed fracture with nonunion: Secondary | ICD-10-CM | POA: Diagnosis not present

## 2014-05-28 DIAGNOSIS — Z419 Encounter for procedure for purposes other than remedying health state, unspecified: Secondary | ICD-10-CM

## 2014-05-28 DIAGNOSIS — Z7982 Long term (current) use of aspirin: Secondary | ICD-10-CM | POA: Diagnosis not present

## 2014-05-28 DIAGNOSIS — E860 Dehydration: Secondary | ICD-10-CM | POA: Diagnosis not present

## 2014-05-28 DIAGNOSIS — H3530 Unspecified macular degeneration: Secondary | ICD-10-CM | POA: Diagnosis not present

## 2014-05-28 DIAGNOSIS — E871 Hypo-osmolality and hyponatremia: Secondary | ICD-10-CM | POA: Diagnosis present

## 2014-05-28 DIAGNOSIS — R41841 Cognitive communication deficit: Secondary | ICD-10-CM | POA: Diagnosis not present

## 2014-05-28 DIAGNOSIS — M810 Age-related osteoporosis without current pathological fracture: Secondary | ICD-10-CM | POA: Diagnosis not present

## 2014-05-28 DIAGNOSIS — Z85828 Personal history of other malignant neoplasm of skin: Secondary | ICD-10-CM | POA: Diagnosis not present

## 2014-05-28 DIAGNOSIS — X58XXXD Exposure to other specified factors, subsequent encounter: Secondary | ICD-10-CM | POA: Diagnosis present

## 2014-05-28 DIAGNOSIS — D72829 Elevated white blood cell count, unspecified: Secondary | ICD-10-CM

## 2014-05-28 DIAGNOSIS — K5900 Constipation, unspecified: Secondary | ICD-10-CM | POA: Diagnosis not present

## 2014-05-28 DIAGNOSIS — Z96641 Presence of right artificial hip joint: Secondary | ICD-10-CM | POA: Diagnosis not present

## 2014-05-28 DIAGNOSIS — Z853 Personal history of malignant neoplasm of breast: Secondary | ICD-10-CM

## 2014-05-28 DIAGNOSIS — Z471 Aftercare following joint replacement surgery: Secondary | ICD-10-CM | POA: Diagnosis not present

## 2014-05-28 DIAGNOSIS — M25551 Pain in right hip: Secondary | ICD-10-CM | POA: Diagnosis present

## 2014-05-28 DIAGNOSIS — R0602 Shortness of breath: Secondary | ICD-10-CM | POA: Diagnosis not present

## 2014-05-28 DIAGNOSIS — S129XXS Fracture of neck, unspecified, sequela: Secondary | ICD-10-CM | POA: Diagnosis not present

## 2014-05-28 DIAGNOSIS — M7989 Other specified soft tissue disorders: Secondary | ICD-10-CM | POA: Diagnosis not present

## 2014-05-28 DIAGNOSIS — M6281 Muscle weakness (generalized): Secondary | ICD-10-CM | POA: Diagnosis not present

## 2014-05-28 HISTORY — PX: TOTAL HIP ARTHROPLASTY: SHX124

## 2014-05-28 HISTORY — DX: Unilateral primary osteoarthritis, right hip: M16.11

## 2014-05-28 HISTORY — PX: HARDWARE REMOVAL: SHX979

## 2014-05-28 SURGERY — ARTHROPLASTY, HIP, TOTAL, ANTERIOR APPROACH
Anesthesia: General | Site: Hip | Laterality: Right

## 2014-05-28 MED ORDER — EPHEDRINE SULFATE 50 MG/ML IJ SOLN
INTRAMUSCULAR | Status: DC | PRN
  Administered 2014-05-28 (×2): 5 mg via INTRAVENOUS
  Administered 2014-05-28: 10 mg via INTRAVENOUS

## 2014-05-28 MED ORDER — RISAQUAD PO CAPS
1.0000 | ORAL_CAPSULE | Freq: Every day | ORAL | Status: DC
  Administered 2014-05-29 – 2014-06-03 (×6): 1 via ORAL
  Filled 2014-05-28 (×6): qty 1

## 2014-05-28 MED ORDER — ALUM & MAG HYDROXIDE-SIMETH 200-200-20 MG/5ML PO SUSP: 30.0000 mL | ORAL | Status: DC | PRN

## 2014-05-28 MED ORDER — DIPHENHYDRAMINE HCL 12.5 MG/5ML PO ELIX: 12.5000 mg | ORAL_SOLUTION | ORAL | Status: DC | PRN

## 2014-05-28 MED ORDER — NITROFURANTOIN MACROCRYSTAL 50 MG PO CAPS
50.0000 mg | ORAL_CAPSULE | Freq: Every day | ORAL | Status: DC
  Administered 2014-05-28 – 2014-06-02 (×6): 50 mg via ORAL
  Filled 2014-05-28 (×7): qty 1

## 2014-05-28 MED ORDER — METHOCARBAMOL 1000 MG/10ML IJ SOLN
500.0000 mg | INTRAVENOUS | Status: DC
  Filled 2014-05-28: qty 5

## 2014-05-28 MED ORDER — BISACODYL 5 MG PO TBEC
5.0000 mg | DELAYED_RELEASE_TABLET | Freq: Every day | ORAL | Status: DC | PRN
  Administered 2014-05-30: 5 mg via ORAL
  Filled 2014-05-28: qty 1

## 2014-05-28 MED ORDER — PHENYLEPHRINE HCL 10 MG/ML IJ SOLN
INTRAMUSCULAR | Status: DC | PRN
  Administered 2014-05-28 (×3): 40 ug via INTRAVENOUS
  Administered 2014-05-28 (×5): 80 ug via INTRAVENOUS

## 2014-05-28 MED ORDER — RESTORA PO CAPS: 1.0000 | ORAL_CAPSULE | Freq: Every day | ORAL | Status: DC

## 2014-05-28 MED ORDER — CHLORHEXIDINE GLUCONATE 4 % EX LIQD: 60.0000 mL | Freq: Once | CUTANEOUS | Status: DC

## 2014-05-28 MED ORDER — PROMETHAZINE HCL 25 MG/ML IJ SOLN
6.2500 mg | INTRAMUSCULAR | Status: DC | PRN
Start: 2014-05-28 — End: 2014-05-28

## 2014-05-28 MED ORDER — ROCURONIUM BROMIDE 100 MG/10ML IV SOLN
INTRAVENOUS | Status: DC | PRN
  Administered 2014-05-28: 40 mg via INTRAVENOUS
  Administered 2014-05-28: 10 mg via INTRAVENOUS

## 2014-05-28 MED ORDER — HYDROMORPHONE HCL 1 MG/ML IJ SOLN
0.5000 mg | INTRAMUSCULAR | Status: DC | PRN
  Filled 2014-05-28: qty 1

## 2014-05-28 MED ORDER — ACETAMINOPHEN 650 MG RE SUPP: 650.0000 mg | Freq: Four times a day (QID) | RECTAL | Status: DC | PRN

## 2014-05-28 MED ORDER — ONDANSETRON HCL 4 MG/2ML IJ SOLN
INTRAMUSCULAR | Status: DC | PRN
  Administered 2014-05-28 (×2): 4 mg via INTRAVENOUS

## 2014-05-28 MED ORDER — HYDROCODONE-ACETAMINOPHEN 5-325 MG PO TABS
1.0000 | ORAL_TABLET | ORAL | Status: DC | PRN
  Administered 2014-05-28 – 2014-05-30 (×9): 1 via ORAL
  Administered 2014-06-01 – 2014-06-02 (×2): 2 via ORAL
  Filled 2014-05-28: qty 1
  Filled 2014-05-28: qty 2
  Filled 2014-05-28 (×3): qty 1
  Filled 2014-05-28: qty 2
  Filled 2014-05-28 (×3): qty 1
  Filled 2014-05-28: qty 2
  Filled 2014-05-28: qty 1
  Filled 2014-05-28: qty 2
  Filled 2014-05-28 (×2): qty 1

## 2014-05-28 MED ORDER — LACTATED RINGERS IV SOLN
INTRAVENOUS | Status: DC | PRN
  Administered 2014-05-28 (×2): via INTRAVENOUS

## 2014-05-28 MED ORDER — NEOSTIGMINE METHYLSULFATE 10 MG/10ML IV SOLN
INTRAVENOUS | Status: DC | PRN
  Administered 2014-05-28: 3 mg via INTRAVENOUS

## 2014-05-28 MED ORDER — 0.9 % SODIUM CHLORIDE (POUR BTL) OPTIME
TOPICAL | Status: DC | PRN
  Administered 2014-05-28: 1000 mL

## 2014-05-28 MED ORDER — PROPOFOL 10 MG/ML IV BOLUS
INTRAVENOUS | Status: DC | PRN
  Administered 2014-05-28: 20 mg via INTRAVENOUS
  Administered 2014-05-28: 10 mg via INTRAVENOUS
  Administered 2014-05-28: 70 mg via INTRAVENOUS

## 2014-05-28 MED ORDER — ONDANSETRON HCL 4 MG/2ML IJ SOLN: 4.0000 mg | Freq: Four times a day (QID) | INTRAMUSCULAR | Status: DC | PRN

## 2014-05-28 MED ORDER — CEFAZOLIN SODIUM-DEXTROSE 2-3 GM-% IV SOLR
2.0000 g | Freq: Four times a day (QID) | INTRAVENOUS | Status: AC
  Administered 2014-05-28 (×2): 2 g via INTRAVENOUS
  Filled 2014-05-28 (×2): qty 50

## 2014-05-28 MED ORDER — GLYCOPYRROLATE 0.2 MG/ML IJ SOLN
INTRAMUSCULAR | Status: DC | PRN
  Administered 2014-05-28: .4 mg via INTRAVENOUS

## 2014-05-28 MED ORDER — PROPOFOL 10 MG/ML IV BOLUS
INTRAVENOUS | Status: AC
  Filled 2014-05-28: qty 20

## 2014-05-28 MED ORDER — MIRABEGRON ER 25 MG PO TB24
25.0000 mg | ORAL_TABLET | Freq: Every day | ORAL | Status: DC
  Administered 2014-05-28 – 2014-06-03 (×7): 25 mg via ORAL
  Filled 2014-05-28 (×8): qty 1

## 2014-05-28 MED ORDER — DOCUSATE SODIUM 100 MG PO CAPS
100.0000 mg | ORAL_CAPSULE | Freq: Two times a day (BID) | ORAL | Status: DC
  Administered 2014-05-28 – 2014-05-31 (×7): 100 mg via ORAL
  Filled 2014-05-28 (×7): qty 1

## 2014-05-28 MED ORDER — METHOCARBAMOL 500 MG PO TABS
ORAL_TABLET | ORAL | Status: AC
  Filled 2014-05-28: qty 1

## 2014-05-28 MED ORDER — MENTHOL 3 MG MT LOZG: 1.0000 | LOZENGE | OROMUCOSAL | Status: DC | PRN

## 2014-05-28 MED ORDER — LACTATED RINGERS IV SOLN: INTRAVENOUS | Status: DC

## 2014-05-28 MED ORDER — ASPIRIN EC 325 MG PO TBEC
325.0000 mg | DELAYED_RELEASE_TABLET | Freq: Two times a day (BID) | ORAL | Status: DC
  Administered 2014-05-29 – 2014-06-03 (×10): 325 mg via ORAL
  Filled 2014-05-28 (×10): qty 1

## 2014-05-28 MED ORDER — METOCLOPRAMIDE HCL 5 MG PO TABS: 5.0000 mg | ORAL_TABLET | Freq: Three times a day (TID) | ORAL | Status: DC | PRN

## 2014-05-28 MED ORDER — VENLAFAXINE HCL 75 MG PO TABS
75.0000 mg | ORAL_TABLET | Freq: Every day | ORAL | Status: DC
  Administered 2014-05-28 – 2014-05-29 (×2): 75 mg via ORAL
  Filled 2014-05-28 (×3): qty 1

## 2014-05-28 MED ORDER — DEXTROSE 5 % IV SOLN
500.0000 mg | Freq: Four times a day (QID) | INTRAVENOUS | Status: DC | PRN
  Filled 2014-05-28: qty 5

## 2014-05-28 MED ORDER — METOCLOPRAMIDE HCL 5 MG/ML IJ SOLN: 5.0000 mg | Freq: Three times a day (TID) | INTRAMUSCULAR | Status: DC | PRN

## 2014-05-28 MED ORDER — HYDROMORPHONE HCL 1 MG/ML IJ SOLN
0.2500 mg | INTRAMUSCULAR | Status: DC | PRN
  Administered 2014-05-28 (×2): 0.25 mg via INTRAVENOUS

## 2014-05-28 MED ORDER — FENTANYL CITRATE (PF) 250 MCG/5ML IJ SOLN
INTRAMUSCULAR | Status: AC
  Filled 2014-05-28: qty 5

## 2014-05-28 MED ORDER — ACETAMINOPHEN 325 MG PO TABS
650.0000 mg | ORAL_TABLET | Freq: Four times a day (QID) | ORAL | Status: DC | PRN
  Administered 2014-05-31: 650 mg via ORAL
  Filled 2014-05-28: qty 2

## 2014-05-28 MED ORDER — PHENOL 1.4 % MT LIQD: 1.0000 | OROMUCOSAL | Status: DC | PRN

## 2014-05-28 MED ORDER — TRANEXAMIC ACID 1000 MG/10ML IV SOLN
2000.0000 mg | INTRAVENOUS | Status: AC
  Administered 2014-05-28: 2000 mg via TOPICAL
  Filled 2014-05-28: qty 20

## 2014-05-28 MED ORDER — ONDANSETRON HCL 4 MG PO TABS: 4.0000 mg | ORAL_TABLET | Freq: Four times a day (QID) | ORAL | Status: DC | PRN

## 2014-05-28 MED ORDER — METHOCARBAMOL 500 MG PO TABS
500.0000 mg | ORAL_TABLET | Freq: Four times a day (QID) | ORAL | Status: DC | PRN
  Administered 2014-05-28 – 2014-05-29 (×4): 500 mg via ORAL
  Filled 2014-05-28 (×3): qty 1

## 2014-05-28 MED ORDER — HYDROMORPHONE HCL 1 MG/ML IJ SOLN
INTRAMUSCULAR | Status: AC
  Administered 2014-05-28: 1 mg
  Filled 2014-05-28: qty 1

## 2014-05-28 MED ORDER — FENTANYL CITRATE (PF) 100 MCG/2ML IJ SOLN
INTRAMUSCULAR | Status: DC | PRN
  Administered 2014-05-28: 100 ug via INTRAVENOUS
  Administered 2014-05-28 (×2): 50 ug via INTRAVENOUS
  Administered 2014-05-28: 25 ug via INTRAVENOUS
  Administered 2014-05-28: 50 ug via INTRAVENOUS
  Administered 2014-05-28: 25 ug via INTRAVENOUS
  Administered 2014-05-28: 50 ug via INTRAVENOUS

## 2014-05-28 MED ORDER — DEXAMETHASONE SODIUM PHOSPHATE 4 MG/ML IJ SOLN
INTRAMUSCULAR | Status: DC | PRN
  Administered 2014-05-28: 4 mg via INTRAVENOUS

## 2014-05-28 MED ORDER — ZOLPIDEM TARTRATE 5 MG PO TABS: 2.5000 mg | ORAL_TABLET | Freq: Every evening | ORAL | Status: DC | PRN

## 2014-05-28 MED ORDER — LIDOCAINE HCL (CARDIAC) 20 MG/ML IV SOLN
INTRAVENOUS | Status: DC | PRN
  Administered 2014-05-28: 90 mg via INTRAVENOUS

## 2014-05-28 SURGICAL SUPPLY — 79 items
BANDAGE ELASTIC 4 VELCRO ST LF (GAUZE/BANDAGES/DRESSINGS) ×3 IMPLANT
BANDAGE ELASTIC 6 VELCRO ST LF (GAUZE/BANDAGES/DRESSINGS) ×3 IMPLANT
BANDAGE ESMARK 6X9 LF (GAUZE/BANDAGES/DRESSINGS) ×1 IMPLANT
BLADE SAW SGTL 18X1.27X75 (BLADE) ×2 IMPLANT
BLADE SAW SGTL 18X1.27X75MM (BLADE) ×1
BLADE SURG ROTATE 9660 (MISCELLANEOUS) IMPLANT
BNDG COHESIVE 4X5 TAN STRL (GAUZE/BANDAGES/DRESSINGS) ×3 IMPLANT
BNDG ESMARK 6X9 LF (GAUZE/BANDAGES/DRESSINGS) ×3
BNDG GAUZE ELAST 4 BULKY (GAUZE/BANDAGES/DRESSINGS) ×3 IMPLANT
CAPT HIP TOTAL 2 ×3 IMPLANT
CELLS DAT CNTRL 66122 CELL SVR (MISCELLANEOUS) ×1 IMPLANT
COVER PERINEAL POST (MISCELLANEOUS) ×3 IMPLANT
COVER SURGICAL LIGHT HANDLE (MISCELLANEOUS) ×3 IMPLANT
CUFF TOURNIQUET SINGLE 34IN LL (TOURNIQUET CUFF) IMPLANT
CUFF TOURNIQUET SINGLE 44IN (TOURNIQUET CUFF) IMPLANT
DRAPE C-ARM 42X72 X-RAY (DRAPES) ×3 IMPLANT
DRAPE EXTREMITY T 121X128X90 (DRAPE) ×3 IMPLANT
DRAPE IMP U-DRAPE 54X76 (DRAPES) ×3 IMPLANT
DRAPE INCISE IOBAN 66X45 STRL (DRAPES) ×3 IMPLANT
DRAPE ORTHO SPLIT 77X108 STRL (DRAPES) ×4
DRAPE PROXIMA HALF (DRAPES) ×6 IMPLANT
DRAPE STERI IOBAN 125X83 (DRAPES) ×3 IMPLANT
DRAPE SURG ORHT 6 SPLT 77X108 (DRAPES) ×2 IMPLANT
DRAPE U-SHAPE 47X51 STRL (DRAPES) ×9 IMPLANT
DRSG AQUACEL AG ADV 3.5X10 (GAUZE/BANDAGES/DRESSINGS) ×3 IMPLANT
DRSG EMULSION OIL 3X3 NADH (GAUZE/BANDAGES/DRESSINGS) ×3 IMPLANT
DRSG MEPILEX BORDER 4X4 (GAUZE/BANDAGES/DRESSINGS) ×3 IMPLANT
DRSG PAD ABDOMINAL 8X10 ST (GAUZE/BANDAGES/DRESSINGS) ×3 IMPLANT
DURAPREP 26ML APPLICATOR (WOUND CARE) ×3 IMPLANT
ELECT BLADE 4.0 EZ CLEAN MEGAD (MISCELLANEOUS) ×3
ELECT CAUTERY BLADE 6.4 (BLADE) ×3 IMPLANT
ELECT REM PT RETURN 9FT ADLT (ELECTROSURGICAL) ×3
ELECTRODE BLDE 4.0 EZ CLN MEGD (MISCELLANEOUS) ×1 IMPLANT
ELECTRODE REM PT RTRN 9FT ADLT (ELECTROSURGICAL) ×1 IMPLANT
FACESHIELD WRAPAROUND (MASK) ×6 IMPLANT
GAUZE SPONGE 4X4 12PLY STRL (GAUZE/BANDAGES/DRESSINGS) ×3 IMPLANT
GLOVE BIO SURGEON STRL SZ 6.5 (GLOVE) ×4 IMPLANT
GLOVE BIO SURGEON STRL SZ7 (GLOVE) ×3 IMPLANT
GLOVE BIO SURGEON STRL SZ8 (GLOVE) ×15 IMPLANT
GLOVE BIO SURGEONS STRL SZ 6.5 (GLOVE) ×2
GLOVE BIOGEL M SZ8.5 STRL (GLOVE) ×3 IMPLANT
GLOVE BIOGEL PI IND STRL 7.0 (GLOVE) ×1 IMPLANT
GLOVE BIOGEL PI IND STRL 7.5 (GLOVE) ×2 IMPLANT
GLOVE BIOGEL PI IND STRL 8 (GLOVE) ×3 IMPLANT
GLOVE BIOGEL PI INDICATOR 7.0 (GLOVE) ×2
GLOVE BIOGEL PI INDICATOR 7.5 (GLOVE) ×4
GLOVE BIOGEL PI INDICATOR 8 (GLOVE) ×6
GOWN STRL REUS W/ TWL LRG LVL3 (GOWN DISPOSABLE) ×1 IMPLANT
GOWN STRL REUS W/ TWL XL LVL3 (GOWN DISPOSABLE) ×3 IMPLANT
GOWN STRL REUS W/TWL 2XL LVL3 (GOWN DISPOSABLE) ×3 IMPLANT
GOWN STRL REUS W/TWL LRG LVL3 (GOWN DISPOSABLE) ×2
GOWN STRL REUS W/TWL XL LVL3 (GOWN DISPOSABLE) ×6
KIT BASIN OR (CUSTOM PROCEDURE TRAY) ×3 IMPLANT
KIT ROOM TURNOVER OR (KITS) ×3 IMPLANT
LINER BOOT UNIVERSAL DISP (MISCELLANEOUS) IMPLANT
MANIFOLD NEPTUNE II (INSTRUMENTS) ×3 IMPLANT
NS IRRIG 1000ML POUR BTL (IV SOLUTION) ×3 IMPLANT
PACK GENERAL/GYN (CUSTOM PROCEDURE TRAY) ×3 IMPLANT
PACK TOTAL JOINT (CUSTOM PROCEDURE TRAY) ×3 IMPLANT
PACK UNIVERSAL I (CUSTOM PROCEDURE TRAY) ×3 IMPLANT
PAD ARMBOARD 7.5X6 YLW CONV (MISCELLANEOUS) ×6 IMPLANT
PAD CAST 4YDX4 CTTN HI CHSV (CAST SUPPLIES) ×1 IMPLANT
PADDING CAST COTTON 4X4 STRL (CAST SUPPLIES) ×2
RTRCTR WOUND ALEXIS 18CM MED (MISCELLANEOUS) ×3
STAPLER VISISTAT 35W (STAPLE) ×3 IMPLANT
STOCKINETTE IMPERVIOUS 9X36 MD (GAUZE/BANDAGES/DRESSINGS) ×3 IMPLANT
SUT ETHIBOND NAB CT1 #1 30IN (SUTURE) ×9 IMPLANT
SUT ETHILON 4 0 FS 1 (SUTURE) ×3 IMPLANT
SUT VIC AB 0 CT1 27 (SUTURE) ×2
SUT VIC AB 0 CT1 27XBRD ANBCTR (SUTURE) ×1 IMPLANT
SUT VIC AB 1 CT1 27 (SUTURE) ×2
SUT VIC AB 1 CT1 27XBRD ANBCTR (SUTURE) ×1 IMPLANT
SUT VIC AB 2-0 CT1 27 (SUTURE) ×2
SUT VIC AB 2-0 CT1 TAPERPNT 27 (SUTURE) ×1 IMPLANT
SUT VLOC 180 0 24IN GS25 (SUTURE) ×3 IMPLANT
TOWEL OR 17X24 6PK STRL BLUE (TOWEL DISPOSABLE) ×3 IMPLANT
TOWEL OR 17X26 10 PK STRL BLUE (TOWEL DISPOSABLE) ×6 IMPLANT
TRAY FOLEY CATH 14FR (SET/KITS/TRAYS/PACK) IMPLANT
WATER STERILE IRR 1000ML POUR (IV SOLUTION) ×6 IMPLANT

## 2014-05-28 NOTE — Evaluation (Signed)
Physical Therapy Evaluation Patient Details Name: Cassandra Glenn MRN: [id number] DOB: 22-May-1920 Today's Date: 05/28/2014   History of Present Illness  Pt is a 81 y/o F s/p R THA, direct ant approach.  Pt's PMH includes urinary frequency, hyperflycemia, recurring UTI, compression fx of thoracic vertebra, breast cancer, macular degeneration, HOH, spinal stenosis w/ nerogenic claudication, trigger finger, R BBB, edema, lumbago, osteoporosis, hypotension, and SOB.  Clinical Impression  Pt is s/p R THA resulting in the deficits listed below (see PT Problem List).  Pt very lethargic this session but able to perform stand pivot transfer and complete therapeutic exercises supine and sitting EOB. Pt will benefit from skilled PT to increase their independence and safety with mobility to allow discharge to the venue listed below.     Follow Up Recommendations SNF;Supervision/Assistance - 24 hour    Equipment Recommendations   (TBD by next venue of care)    Recommendations for Other Services       Precautions / Restrictions Precautions Precautions: Fall Precaution Comments: direct ant approach, no hip precautions Restrictions Weight Bearing Restrictions: Yes RLE Weight Bearing: Weight bearing as tolerated      Mobility  Bed Mobility Overal bed mobility: Needs Assistance Bed Mobility: Supine to Sit     Supine to sit: Mod assist;HOB elevated     General bed mobility comments: Pt required HOB elevated, verbal and tactile cues for proper sequencing of BLEs to sitting EOB.  Mod use of bed rails and PT use of bed pad to scoots hips to sitting EOB.  Transfers Overall transfer level: Needs assistance Equipment used: Rolling walker (2 wheeled) Transfers: Sit to/from [facility] Sit to Stand: Min assist;From elevated surface Stand pivot transfers: Min assist       General transfer comment: Sit>stand x2.  Cues for hand placement and to push through BUEs and BLEs.  Min  assist to maintain balance once standing EOB and to power up to standing.  Ambulation/Gait             General Gait Details: Pivotal steps only  Stairs            Wheelchair Mobility    Modified Rankin (Stroke Patients Only)       Balance Overall balance assessment: Needs assistance;History of Falls Sitting-balance support: Bilateral upper extremity supported;Feet supported Sitting balance-Leahy Scale: Fair     Standing balance support: Bilateral upper extremity supported;During functional activity Standing balance-Leahy Scale: Fair                               Pertinent Vitals/Pain Pain Assessment: Faces Faces Pain Scale: Hurts even more Pain Location: R hip Pain Descriptors / Indicators: Moaning;Grimacing;Guarding Pain Intervention(s): Limited activity within patient's tolerance;Monitored during session;Repositioned    Home Living Family/patient expects to be discharged to:: Skilled nursing facility                 Additional Comments: Pt from independent living.  Will go to SNF prior to transfering back to independent living.    Prior Function Level of Independence: Independent with assistive device(s)         Comments: RW and cane until 1 week ago.  RW for past week 2/2 fall ~1 week ago.     Hand Dominance        Extremity/Trunk Assessment               Lower Extremity Assessment: RLE deficits/detail;Generalized weakness  RLE Deficits / Details: weakness and limited ROM as expected s/p R THA       Communication   Communication: HOH  Cognition Arousal/Alertness: Lethargic Behavior During Therapy: WFL for tasks assessed/performed Overall Cognitive Status: Within Functional Limits for tasks assessed                      General Comments General comments (skin integrity, edema, etc.): Pt w/ fall ~1 week ago and presents today w/ hematoma on L side of face 2/2 this fall.  Pt's daughter reports CT scan was neg  and pt did not have concussion.    Exercises Total Joint Exercises Ankle Circles/Pumps: AROM;Both;15 reps;Supine Quad Sets: AROM;Both;5 reps;Supine Hip ABduction/ADduction: AAROM;Right;5 reps;Supine Straight Leg Raises: AAROM;Right;5 reps;Supine Long Arc Quad: AROM;Left;10 reps;Seated      Assessment/Plan    PT Assessment Patient needs continued PT services  PT Diagnosis Difficulty walking;Abnormality of gait;Generalized weakness;Acute pain   PT Problem List Decreased strength;Decreased range of motion;Decreased activity tolerance;Decreased balance;Decreased mobility;Decreased coordination;Decreased cognition;Decreased knowledge of use of DME;Decreased safety awareness;Decreased knowledge of precautions;Decreased skin integrity;Pain  PT Treatment Interventions DME instruction;Gait training;Functional mobility training;Therapeutic activities;Therapeutic exercise;Balance training;Neuromuscular re-education;Cognitive remediation;Patient/family education;Modalities   PT Goals (Current goals can be found in the Care Plan section) Acute Rehab PT Goals Patient Stated Goal: none stated PT Goal Formulation: With patient/family Time For Goal Achievement: 06/04/14 Potential to Achieve Goals: Good    Frequency 7X/week   Barriers to discharge Decreased caregiver support Pt lives alone in independent living    Co-evaluation               End of Session Equipment Utilized During Treatment: Gait belt Activity Tolerance: Patient limited by lethargy Patient left: in chair;with call bell/phone within reach;with chair alarm set;with nursing/sitter in room;with family/visitor present Nurse Communication: Mobility status;Precautions;Weight bearing status         Time: 8563-1497 PT Time Calculation (min) (ACUTE ONLY): 41 min   Charges:   PT Evaluation $Initial PT Evaluation Tier I: 1 Procedure PT Treatments $Therapeutic Exercise: 8-22 mins $Therapeutic Activity: 8-22 mins   PT G  Codes:       Joslyn Hy PT, DPT [phone] Pager: [phone] 05/28/2014, 3:35 PM

## 2014-05-28 NOTE — Anesthesia Preprocedure Evaluation (Addendum)
Anesthesia Evaluation  Patient identified by MRN, date of birth, ID band Patient awake    Reviewed: Allergy & Precautions, NPO status , Patient's Chart, lab work & pertinent test results  History of Anesthesia Complications Negative for: history of anesthetic complications  Airway Mallampati: II  TM Distance: >3 FB Neck ROM: Full    Dental  (+) Dental Advisory Given   Pulmonary neg pulmonary ROS,    Pulmonary exam normal       Cardiovascular + Peripheral Vascular Disease Normal cardiovascular exam    Neuro/Psych Spinal stenosis negative psych ROS   GI/Hepatic negative GI ROS,   Endo/Other    Renal/GU negative Renal ROS     Musculoskeletal   Abdominal   Peds  Hematology negative hematology ROS (+)   Anesthesia Other Findings   Reproductive/Obstetrics                           Anesthesia Physical Anesthesia Plan  ASA: III  Anesthesia Plan: General   Post-op Pain Management:    Induction: Intravenous  Airway Management Planned: Oral ETT  Additional Equipment:   Intra-op Plan:   Post-operative Plan: Extubation in OR  Informed Consent: I have reviewed the patients History and Physical, chart, labs and discussed the procedure including the risks, benefits and alternatives for the proposed anesthesia with the patient or authorized representative who has indicated his/her understanding and acceptance.   Dental advisory given  Plan Discussed with: CRNA, Anesthesiologist and Surgeon  Anesthesia Plan Comments: (Pt and family refuse SAB)        Anesthesia Quick Evaluation

## 2014-05-28 NOTE — Clinical Social Work Note (Signed)
Per report, patient is a bundle patient from [place] to continue to follow and assist with discharge planning needs.  Lubertha Sayres, [place] Cell: [phone]       Fax: [phone] Clinical Social Work: Orthopedics [phone]) and Surgical [phone])

## 2014-05-28 NOTE — Interval H&P Note (Signed)
OK for surgery PD 

## 2014-05-28 NOTE — Op Note (Signed)
PRE-OP DIAGNOSIS:  RIGHT HIP NONUNION FEMORAL NECK FRACTUREE POST-OP DIAGNOSIS:  same PROCEDURE: RIGHT TOTAL CONVERSION TO THR ANTERIOR APPROACH ANESTHESIA:  General SURGEON:  Melrose Nakayama MD ASSISTANT:  Loni Dolly PA-C   INDICATIONS FOR PROCEDURE:  The patient is a 81 y.o. female with a long history of a painful hip.  She underwent pinning of a femoral neck fracture which unfortunately did not heal. The patient has persisted with pain and dysfunction making rest and activity difficult.  Conversion to total hip replacement is offered as surgical treatment.  Informed operative consent was obtained after discussion of possible complications including reaction to anesthesia, infection, neurovascular injury, dislocation, DVT, PE, and death.  The importance of the postoperative rehab program to optimize result was stressed with the patient.  SUMMARY OF FINDINGS AND PROCEDURE:  Under general anesthesia through a anterior approach an the Hana table a right THR was performed. We first removed the three screws through a portion of her old incision. The patient had severe degenerative change and fair bone quality.  We used DePuy components to replace the hip and these were size KA 12 [place]ail femur capped with a +1 102mm ceramic hip ball.  On the acetabular side we used a size 48 Gription shell with a neutral polyethylene liner.  We did use a hole eliminator.  Loni Dolly PA-C assisted throughout and was invaluable to the completion of the case in that he helped position and retract while I performed the procedure.  He also closed simultaneously to help minimize OR time.  I used fluoroscopy throughout the case to check position of implants and leg lengths and read all of these views myself.  DESCRIPTION OF PROCEDURE:  The patient was taken to the OR suite where general anesthetic was applied.  The patient was then positioned on the Hana table supine.  All bony prominences were appropriately padded.  Prep and drape  was then performed in normal sterile fashion.  The patient was given kefzol preoperative antibiotic and an appropriate time out was performed.  Through a small portion of her old incision I placed a screwdriver under fluoroscopic guidance to the lateral aspect of the femur and removed the three cannulated Ace screws without any difficulty. We then took an anterior approach to the right hip.  Dissection was taken through adipose to the tensor fascia lata fascia.  This structure was incised longitudinally and we dissected in the intermuscular interval just medial to this muscle.  Cobra retractors were placed superior and inferior to the femoral neck superficial to the capsule.  A capsular incision was then made and the retractors were placed along the femoral neck.  Xray was brought in to get a good level for the femoral neck cut which was made with an oscillating saw and osteotome.  The femoral head was removed with a corkscrew.  The acetabulum was exposed and some labral tissues were excised. Reaming was taken to the inside wall of the pelvis and sequentially up to 1 mm smaller than the actual component.  A trial of components was done and then the aforementioned acetabular shell was placed in appropriate tilt and anteversion confirmed by fluoroscopy. The liner was placed along with the hole eliminator and attention was turned to the femur.  The leg was brought down and over into adduction and the elevator bar was used to raise the femur up gently in the wound.  The piriformis was released with care taken to preserve the obturator internus attachment and all of  the posterior capsule. The femur was reamed and then broached to the appropriate size.  A trial reduction was done and the aforementioned head and neck assembly gave [place] the best stability in extension with external rotation.  Leg lengths were felt to be much closer to equal by fluoroscopic exam.  I added about a centimeter to her leg length.  The trial  components were removed and the wound irrigated.  We then placed the femoral component in appropriate anteversion.  The head was applied to a dry stem neck and the hip again reduced.  It was again stable in the aforementioned position.  The would was irrigated again followed by re-approximation of anterior capsule with ethibond suture. Tensor fascia was repaired with V-loc suture  followed by subcutaneous closure with #O and #2 undyed vicryl.  Skin was closed with staples followed by a sterile dressing.  EBL and IOF can be obtained from anesthesia records.  DISPOSITION:  The patient was extubated in the OR and taken to PACU in stable condition to be admitted to the Orthopedic Surgery for appropriate post-op care to include perioperative antibiotics and DVT prophylaxis.

## 2014-05-28 NOTE — Transfer of Care (Signed)
Immediate Anesthesia Transfer of Care Note  Patient: Cassandra Glenn  Procedure(s) Performed: Procedure(s): TOTAL HIP ARTHROPLASTY ANTERIOR APPROACH (Right) HARDWARE REMOVAL (Right)  Patient Location: PACU  Anesthesia Type:General  Level of Consciousness: awake, alert  and oriented  Airway & Oxygen Therapy: Patient Spontanous Breathing and Patient connected to nasal cannula oxygen  Post-op Assessment: Report given to RN and Post -op Vital signs reviewed and stable  Post vital signs: Reviewed and stable  Last Vitals:  Filed Vitals:   05/28/14 0613  BP: 152/57  Pulse: 70  Temp: 36.6 C  Resp: 16    Complications: No apparent anesthesia complications

## 2014-05-28 NOTE — Anesthesia Procedure Notes (Signed)
Procedure Name: Intubation Date/Time: 05/28/2014 8:04 AM Performed by: Merdis Delay Pre-anesthesia Checklist: Patient identified, Timeout performed, Emergency Drugs available, Suction available and Patient being monitored Patient Re-evaluated:Patient Re-evaluated prior to inductionOxygen Delivery Method: Circle system utilized Preoxygenation: Pre-oxygenation with 100% oxygen Intubation Type: IV induction Ventilation: Mask ventilation without difficulty Grade View: Grade II Tube type: Oral Tube size: 7.0 mm Number of attempts: 1 Airway Equipment and Method: Stylet Placement Confirmation: ETT inserted through vocal cords under direct vision,  breath sounds checked- equal and bilateral,  positive ETCO2 and CO2 detector Secured at: 22 cm Tube secured with: Tape Dental Injury: Teeth and Oropharynx as per pre-operative assessment

## 2014-05-28 NOTE — Progress Notes (Signed)
Pt is s/p fall-old bruising noted over left side of face.

## 2014-05-28 NOTE — Anesthesia Postprocedure Evaluation (Signed)
Anesthesia Post Note  Patient: Cassandra Glenn  Procedure(s) Performed: Procedure(s) (LRB): TOTAL HIP ARTHROPLASTY ANTERIOR APPROACH (Right) HARDWARE REMOVAL (Right)  Anesthesia type: general  Patient location: PACU  Post pain: Pain level controlled  Post assessment: Patient's Cardiovascular Status Stable  Last Vitals:  Filed Vitals:   05/28/14 1000  BP:   Pulse: 67  Temp:   Resp: 10    Post vital signs: Reviewed and stable  Level of consciousness: sedated  Complications: No apparent anesthesia complications

## 2014-05-29 ENCOUNTER — Encounter (HOSPITAL_COMMUNITY): Payer: Self-pay | Admitting: Orthopaedic Surgery

## 2014-05-29 DIAGNOSIS — D62 Acute posthemorrhagic anemia: Secondary | ICD-10-CM | POA: Clinically undetermined

## 2014-05-29 LAB — BASIC METABOLIC PANEL
Anion gap: 6 (ref 5–15)
Anion gap: 8 (ref 5–15)
BUN: 10 mg/dL (ref 6–20)
BUN: 8 mg/dL (ref 6–20)
CO2: 27 mmol/L (ref 22–32)
CO2: 26 mmol/L (ref 22–32)
CREATININE: 0.71 mg/dL (ref 0.44–1.00)
Calcium: 7.9 mg/dL — ABNORMAL LOW (ref 8.9–10.3)
Calcium: 8 mg/dL — ABNORMAL LOW (ref 8.9–10.3)
Chloride: 92 mmol/L — ABNORMAL LOW (ref 101–111)
Chloride: 86 mmol/L — ABNORMAL LOW (ref 101–111)
Creatinine, Ser: 0.55 mg/dL (ref 0.44–1.00)
GFR calc non Af Amer: >60 mL/min (ref >60)
GFR calc non Af Amer: >60 mL/min (ref >60)
Glucose, Bld: 150 mg/dL — ABNORMAL HIGH (ref 65–99)
Glucose, Bld: 149 mg/dL — ABNORMAL HIGH (ref 65–99)
POTASSIUM: 4.2 mmol/L (ref 3.5–5.1)
POTASSIUM: 4.5 mmol/L (ref 3.5–5.1)
Sodium: 125 mmol/L — ABNORMAL LOW (ref 135–145)
Sodium: 120 mmol/L — ABNORMAL LOW (ref 135–145)

## 2014-05-29 LAB — CBC
HCT: 23.1 % — ABNORMAL LOW (ref 36.0–46.0)
HEMOGLOBIN: 7.7 g/dL — AB (ref 12.0–15.0)
MCH: 29.6 pg (ref 26.0–34.0)
MCHC: 33.3 g/dL (ref 30.0–36.0)
MCV: 88.8 fL (ref 78.0–100.0)
Platelets: 183 10*3/uL (ref 150–400)
RBC: 2.6 MIL/uL — AB (ref 3.87–5.11)
RDW: 14.4 % (ref 11.5–15.5)
WBC: 8.8 10*3/uL (ref 4.0–10.5)

## 2014-05-29 LAB — PREPARE RBC (CROSSMATCH)

## 2014-05-29 MED ORDER — SODIUM CHLORIDE 0.9 % IV SOLN
Freq: Once | INTRAVENOUS | Status: AC
  Administered 2014-05-29: 10:00:00 via INTRAVENOUS

## 2014-05-29 NOTE — Care Management Note (Signed)
Case Management Note  Patient Details  Name: Cassandra Glenn MRN: [id number] Date of Birth: 07/07/1920  Subjective/Objective:       81 yr old female admitted with osteoarthritis of the right hip.            Action/Plan:  Case manager spoke with patient and her daughters. She will go the [facility] for shortterm rehab. Social worker is aware.   Expected Discharge Date: 05/31/14                 Expected Discharge Plan:  Skilled Nursing Facility  In-House Referral:  Clinical Social Work  Discharge planning Services  CM Consult  Post Acute Care Choice:    Choice offered to:  NA  DME Arranged:    DME Agency:     HH Arranged:  NA HH Agency:     Status of Service:  Completed, signed off  Medicare Important Message Given:    Date Medicare IM Given:    Medicare IM give by:    Date Additional Medicare IM Given:    Additional Medicare Important Message give by:     If discussed at [place] of Stay Meetings, dates discussed:    Additional Comments:    Ninfa Meeker, RN 05/29/2014, 3:01 PM

## 2014-05-29 NOTE — Progress Notes (Signed)
Subjective: 1 Day Post-Op Procedure(s) (LRB): TOTAL HIP ARTHROPLASTY ANTERIOR APPROACH (Right) HARDWARE REMOVAL (Right)  Patient resting comfortably in bed this morning. She had a syncopal episode upon getting up to bedside commode yesterday. She feels tierd and weak but her pain is controlled pretty well.    Activity level:  wbat Diet tolerance:  ok Voiding:  Still some difficulty with urination which she also has at home. Patient reports pain as mild.    Objective: Vital signs in last 24 hours: Temp:  [97.6 F (36.4 C)-98.2 F (36.8 C)] 97.7 F (36.5 C) (05/18 0508) Pulse Rate:  [59-88] 88 (05/18 0508) Resp:  [9-18] 16 (05/18 0508) BP: (92-150)/(35-52) 92/39 mmHg (05/18 0508) SpO2:  [67 %-100 %] 98 % (05/18 0508)  Labs:  Recent Labs  05/29/14 0500  HGB 7.7*    Recent Labs  05/29/14 0500  WBC 8.8  RBC 2.60*  HCT 23.1*  PLT 183    Recent Labs  05/29/14 0500  NA 125*  K 4.2  CL 92*  CO2 27  BUN 10  CREATININE 0.71  GLUCOSE 150*  CALCIUM 7.9*   No results for input(s): LABPT, INR in the last 72 hours.  Physical Exam:  Neurologically intact ABD soft Neurovascular intact Sensation intact distally Intact pulses distally Dorsiflexion/Plantar flexion intact Incision: dressing C/D/I and no drainage No cellulitis present Compartment soft  Assessment/Plan:  1 Day Post-Op Procedure(s) (LRB): TOTAL HIP ARTHROPLASTY ANTERIOR APPROACH (Right) HARDWARE REMOVAL (Right) Advance diet Up with therapy Discharge to SNF once stable and doing well and cleared by PT. We will give her 2 units of blood slowly today. Continue on ASA 325mg  BID x 4 weeks post op for DVT prevention. Continue current pain meds. Follow up in office 2 weeks post op.  Cassandra Glenn, Cassandra Glenn 05/29/2014, 8:53 AM

## 2014-05-29 NOTE — Progress Notes (Signed)
OT Cancellation Note  Patient Details Name: Cassandra Glenn MRN: [id number] DOB: 1920-07-15   Cancelled Treatment:    Reason Eval/Treat Not Completed: Other (comment) Pt has Medicare and current D/C plan is SNF. No apparent immediate acute care OT needs, therefore will defer OT to SNF. If OT eval is needed please call Acute Rehab Dept. at [phone] or text page OT at [phone].    Villa Herb M   Cyndie Chime, OTR/L Occupational Therapist [phone] (pager)  05/29/2014, 11:32 AM

## 2014-05-29 NOTE — Progress Notes (Signed)
Physical Therapy Treatment Patient Details Name: Cassandra Glenn MRN: [id number] DOB: August 22, 1920 Today's Date: 05/29/2014    History of Present Illness Pt is a 81 y/o F s/p R THA, direct ant approach.  Pt's PMH includes urinary frequency, hyperflycemia, recurring UTI, compression fx of thoracic vertebra, breast cancer, macular degeneration, HOH, spinal stenosis w/ nerogenic claudication, trigger finger, R BBB, edema, lumbago, osteoporosis, hypotension, and SOB.    PT Comments    Pt limited to therapeutic exercises in bed this session 2/2 symptomatic low BP 104/40 in the supine position.  Pt had syncope episode on 05/28/14 and is scheduled to receive 2 units of blood today per RN and MD.  Pt will benefit from continued skilled PT services to increase functional independence and safety.   Follow Up Recommendations  SNF;Supervision/Assistance - 24 hour     Equipment Recommendations   (TBD by next venue of care)    Recommendations for Other Services       Precautions / Restrictions Precautions Precautions: Fall Precaution Comments: direct ant approach, no hip precautions.  Pt w/ episode of sycope on 05/28/14 Restrictions Weight Bearing Restrictions: Yes RLE Weight Bearing: Weight bearing as tolerated    Mobility  Bed Mobility                  Transfers                    Ambulation/Gait                 Stairs            Wheelchair Mobility    Modified Rankin (Stroke Patients Only)       Balance                                    Cognition Arousal/Alertness: Awake/alert Behavior During Therapy: WFL for tasks assessed/performed Overall Cognitive Status: Within Functional Limits for tasks assessed                      Exercises Total Joint Exercises Ankle Circles/Pumps: AROM;Both;15 reps;Supine Quad Sets: AROM;Both;10 reps;Supine Heel Slides: AAROM;Right;Supine;10 reps Hip ABduction/ADduction: AAROM;Right;10  reps;Supine Straight Leg Raises: AAROM;Right;5 reps;Supine    General Comments General comments (skin integrity, edema, etc.): Session limited to exercises supine in bed.  Bed mobility was not attempted 2/2 pt's report of dizziness during therapeutic exercises and BP of 104/40.  Pt is scheduled to receive 2 units of blood today.  RN notified of pt's BP.      Pertinent Vitals/Pain Pain Assessment: 0-10 Pain Score: 5  Pain Location: R hip Pain Descriptors / Indicators: Aching Pain Intervention(s): Limited activity within patient's tolerance;Monitored during session;Repositioned    Home Living                      Prior Function            PT Goals (current goals can now be found in the care plan section) Acute Rehab PT Goals Patient Stated Goal: to move as much as she can Progress towards PT goals: Not progressing toward goals - comment (2/2 low BP)    Frequency  7X/week    PT Plan Current plan remains appropriate    Co-evaluation             End of Session   Activity Tolerance: Treatment limited secondary to medical  complications (Comment) (low BP and dizziness) Patient left: in bed;with call bell/phone within reach;with family/visitor present     Time: 1497-0263 PT Time Calculation (min) (ACUTE ONLY): 15 min  Charges:  $Therapeutic Exercise: 8-22 mins                    G Codes:      Joslyn Hy PT, [place] [phone] Pager: [phone] 05/29/2014, 1:36 PM

## 2014-05-29 NOTE — Progress Notes (Signed)
Patient has not voided since initial in and out cath done at 430pm. Patient felt she might can void if up in the commode. Patient was positioned on edge of bed with another RN assistance. Upon standing up patient dazed out and buckled at the knees. Patient was safety brought back onto the bed and sternal rub was done to get patient to respond back. Patient became very pale and her eyes just dazed off. Patient quickly came back and said she felt she was about to faint. BP done at that time was 130/52. Per daughter at the bedside this was happened when she fell. Patient repositioned back in bed and verbalized she felt better. Another set of BP taken afterwards was 105/48. Patient was in and out cath for the second time and got out 362ml of urine. Will continue to monitor.

## 2014-05-30 DIAGNOSIS — R339 Retention of urine, unspecified: Secondary | ICD-10-CM

## 2014-05-30 DIAGNOSIS — D62 Acute posthemorrhagic anemia: Secondary | ICD-10-CM

## 2014-05-30 DIAGNOSIS — D72829 Elevated white blood cell count, unspecified: Secondary | ICD-10-CM

## 2014-05-30 LAB — URINALYSIS, ROUTINE W REFLEX MICROSCOPIC
BILIRUBIN URINE: NEGATIVE
GLUCOSE, UA: NEGATIVE mg/dL
Hgb urine dipstick: NEGATIVE
KETONES UR: NEGATIVE mg/dL
NITRITE: NEGATIVE
Protein, ur: NEGATIVE mg/dL
Specific Gravity, Urine: 1.007 (ref 1.005–1.030)
Urobilinogen, UA: 0.2 mg/dL (ref 0.0–1.0)
pH: 6.5 (ref 5.0–8.0)

## 2014-05-30 LAB — SODIUM
SODIUM: 123 mmol/L — AB (ref 135–145)
SODIUM: 123 mmol/L — AB (ref 135–145)
Sodium: 123 mmol/L — ABNORMAL LOW (ref 135–145)

## 2014-05-30 LAB — CBC
HEMATOCRIT: 30.1 % — AB (ref 36.0–46.0)
HEMOGLOBIN: 10.1 g/dL — AB (ref 12.0–15.0)
MCH: 28.4 pg (ref 26.0–34.0)
MCHC: 33.6 g/dL (ref 30.0–36.0)
MCV: 84.6 fL (ref 78.0–100.0)
PLATELETS: 154 10*3/uL (ref 150–400)
RBC: 3.56 MIL/uL — ABNORMAL LOW (ref 3.87–5.11)
RDW: 15.8 % — ABNORMAL HIGH (ref 11.5–15.5)
WBC: 11.6 10*3/uL — ABNORMAL HIGH (ref 4.0–10.5)

## 2014-05-30 LAB — CORTISOL: Cortisol, Plasma: 26.1 ug/dL

## 2014-05-30 LAB — TSH: TSH: 5.349 u[IU]/mL — ABNORMAL HIGH (ref 0.350–4.500)

## 2014-05-30 LAB — TYPE AND SCREEN
ABO/RH(D): A POS
Antibody Screen: NEGATIVE
UNIT DIVISION: 0
Unit division: 0

## 2014-05-30 LAB — URINE MICROSCOPIC-ADD ON

## 2014-05-30 LAB — OSMOLALITY, URINE: Osmolality, Ur: 166 mOsm/kg — ABNORMAL LOW (ref 390–1090)

## 2014-05-30 LAB — OSMOLALITY: OSMOLALITY: 258 mosm/kg — AB (ref 275–300)

## 2014-05-30 LAB — SODIUM, URINE, RANDOM: Sodium, Ur: <10 mmol/L

## 2014-05-30 MED ORDER — POLYETHYLENE GLYCOL 3350 17 G PO PACK
17.0000 g | PACK | Freq: Every day | ORAL | Status: DC | PRN
  Administered 2014-05-30 – 2014-06-01 (×2): 17 g via ORAL
  Filled 2014-05-30 (×3): qty 1

## 2014-05-30 MED ORDER — TRAMADOL HCL 50 MG PO TABS
100.0000 mg | ORAL_TABLET | Freq: Four times a day (QID) | ORAL | Status: DC | PRN
  Administered 2014-05-30 – 2014-06-03 (×13): 100 mg via ORAL
  Filled 2014-05-30 (×13): qty 2

## 2014-05-30 MED ORDER — POLYETHYLENE GLYCOL 3350 17 G PO PACK
34.0000 g | PACK | Freq: Once | ORAL | Status: DC
  Filled 2014-05-30: qty 2

## 2014-05-30 MED ORDER — SODIUM CHLORIDE 0.9 % IV SOLN
INTRAVENOUS | Status: DC
  Administered 2014-05-30 – 2014-05-31 (×3): via INTRAVENOUS

## 2014-05-30 NOTE — Progress Notes (Signed)
Subjective: 2 Days Post-Op Procedure(s) (LRB): TOTAL HIP ARTHROPLASTY ANTERIOR APPROACH (Right) HARDWARE REMOVAL (Right)  Patient looks much better sittign in bed this morning. She states that she feels much better since receiving blood yesterday. Her sodium was low this morning at 120 but she stated that she felt fine. A medicine consult was done and they saw her earlier today adjustign her meds and fluids long with putting in a foley catheter.   Activity level:  ok Diet tolerance:  ok Voiding:  Foley in place Patient reports pain as mild.    Objective: Vital signs in last 24 hours: Temp:  [97.7 F (36.5 C)-98.5 F (36.9 C)] 98.5 F (36.9 C) (05/19 0629) Pulse Rate:  [79-87] 85 (05/19 0629) Resp:  [16-18] 16 (05/18 2037) BP: (121-145)/(46-57) 130/52 mmHg (05/19 0629) SpO2:  [99 %-100 %] 100 % (05/19 0629)  Labs:  Recent Labs  05/29/14 0500 05/30/14 0533  HGB 7.7* 10.1*    Recent Labs  05/29/14 0500 05/30/14 0533  WBC 8.8 11.6*  RBC 2.60* 3.56*  HCT 23.1* 30.1*  PLT 183 154    Recent Labs  05/29/14 0500 05/29/14 2025  NA 125* 120*  K 4.2 4.5  CL 92* 86*  CO2 27 26  BUN 10 8  CREATININE 0.71 0.55  GLUCOSE 150* 149*  CALCIUM 7.9* 8.0*   No results for input(s): LABPT, INR in the last 72 hours.  Physical Exam:  Neurologically intact ABD soft Neurovascular intact Sensation intact distally Intact pulses distally Dorsiflexion/Plantar flexion intact Incision: dressing C/D/I and no drainage No cellulitis present Compartment soft  Assessment/Plan:  2 Days Post-Op Procedure(s) (LRB): TOTAL HIP ARTHROPLASTY ANTERIOR APPROACH (Right) HARDWARE REMOVAL (Right) Advance diet Up with therapy Discharge to SNF once stable and cleared by the medical team and PT.  We greatly appreciate their medical management and input. Continue ASA 325mg  BID x 4 weeks post op. Follow up in office 2 weeks post op. We will recheck her CBC and BMP in the morning.   Ravinder Lukehart,  Larwance Sachs 05/30/2014, 1:18 PM

## 2014-05-30 NOTE — Consult Note (Signed)
[facility] Medical Consultation  MARLETA LAPIERRE KGU:[id number] DOB: [birth year] DOA: 05/28/2014 PCP: Estill Dooms, MD   Requesting physician:  Loni Dolly Date of consultation:  05/30/2014 Reason for consultation: hyponatremia  Impression/Recommendations  Hyponatremia likely due to acute urinary retention, newly started venlafaxine, dehydration and possible underlying SIADH -  Serum osmolality -  Urine sodium -  Urine osmolality -  TSH -  Cortisol level -  Start normal saline and check sodium q4h -  Free water restriction -  D/c venlafaxine and start again in a few weeks as outpatient with close lab supervision by PCP -  Foley catheter placement  -  Reduce narcotics if possible to reduce urinary retention and try voiding trial again in the morning  Arthritis status post right total hip arthroplasty -  Management per orthopedics  -  Adjustments to pain medication as above  Depression -  She restarted venlafaxine 1 week ago -  D/c venlafaxine and consider restarting as outpatient after she has recovered more since this may be worsening her hyponatremia  Leukocytosis, mild and likely related to recent surgery -  No signs of underlying infection at this time  Acute post-operative blood loss anemia s/p 2 unit blood transfusion with appropriate response to hemoglobin  Rest of chronic medical problems appear stable.  I will followup again tomorrow. Please contact me if I can be of assistance in the meanwhile. Thank you for this consultation.  Chief Complaint: right hip arthritis  HPI:  The patient is a 81 year old female with history of breast cancer in remission, spinal stenosis, arthritis with chronic pain, and SIADH who was admitted for routine total right hip arthroplasty secondary to severe arthritis which was not responding to nonsurgical interventions. She was admitted on 5/16 and her postoperative course was complicated by acute blood loss anemia requiring 2 unit  blood transfusion on 5/18.  She also had a syncopal episode and she has felt lightheaded since yesterday (although a little improved today).  Her preoperative labs on 5/11 demonstrated a sodium of 134. Her sodium on 5/18 was 125. She received a small volume of lactated Ringer's and blood transfusions on 5/18, however this morning her sodium has trended down to 120.  She feels thirsty and dry.  She has no history of heart failure or thyroid disease, but she just started taking venlafaxine about a week ago.  Additionally, she has had urinary retention requiring in and out catheterization with removal of 830mL of urine or more at times.  Currently, she denies lightheadedness, dizziness, nausea.  She has abdominal discomfort she attributes to constipation and to inability to void.  Her right hip is feeling a little better today.    In November 2014 she developed severe hyponatremia with a sodium as low as 100. This was felt to be secondary to a combination of volume depletion and SIADH and she was treated with hypertonic saline under the supervision of nephrology.  Review of Systems:   General:  Denies fevers, chills, weight loss or gain HEENT:  Denies changes to hearing and vision, rhinorrhea, sinus congestion, sore throat.  HOH. CV:  Denies chest pain and palpitations, lower extremity edema.  PULM:  Denies SOB, wheezing, cough.   GI:  Denies nausea, vomiting, diarrhea.  No BM in several days. GU:  Denies dysuria, frequency, urgency ENDO:  Denies polyuria, polydipsia.   HEME:  Denies hematemesis, blood in stools, melena, abnormal bruising or bleeding.  LYMPH:  Denies lymphadenopathy.   MSK:  Chronic arthralgias,  myalgias.   DERM:  Denies skin rash or ulcer.   NEURO:  Denies focal numbness, weakness, slurred speech, confusion, facial droop.  PSYCH:  Denies anxiety and depression, but states she is frequently tearful  Past Medical History  Diagnosis Date  . Macular degeneration   . HOH (hard of  hearing)   . Vaginitis and vulvovaginitis 02/29/2012  . Abnormalities of the hair 02/29/2012  . Spinal stenosis, lumbar region, with neurogenic claudication 11/02/2011  . Trigger finger (acquired) 11/02/2011  . Right bundle branch block 08/30/2011  . Edema 05/07/2011  . Contact dermatitis and other eczema, due to unspecified cause 05/07/2011  . Hypopotassemia 03/26/2011  . Candidiasis of skin and nails 03/19/2011  . Hyposmolality and/or hyponatremia 03/19/2011  . Diverticulosis of colon (without mention of hemorrhage) 03/19/2011  . Unspecified constipation 03/19/2011  . Pain in joint, pelvic region and thigh 03/19/2011  . Lumbago 03/19/2011  . Spasm of muscle 03/19/2011  . Osteoporosis, unspecified 03/19/2011  . Insomnia, unspecified 03/19/2011  . Other malaise and fatigue 03/19/2011  . Impaired fasting glucose 03/19/2011  . Hypotension, unspecified 03/18/2011  . Closed fracture of base of neck of femur 03/18/2011  . Arthritis   . Shortness of breath   . Kidney infection   . Cancer   . Malignant neoplasm of breast (female), unspecified site 10/28/2002   Past Surgical History  Procedure Laterality Date  . Cataract extraction  2010    bialteral  . Hip pinning,cannulated  03/08/2011    Procedure: CANNULATED HIP PINNING;  Surgeon: Johnn Hai, MD;  Location: [facility];  Service: Orthopedics;  Laterality: Right;  . Eye surgery      cataract  . Fracture surgery Left 09/1980    ankle  . Breast surgery Bilateral 2005-10/27/2004    lumpectomy- Streck,MD  . Squamous cell carcinoma excision Right 02/04/2011    forearm- Taffeen, MD  . Orif hip fracture Right 03/08/2011    Bean,MD  . Skin lesion excision Right 02/04/2011    Abdomen lesion spongiotic dermatitis-Taffeen, MD  . Skin cancer excision Left 10/12/2012    lower leg Dr. [place]  . Tonsillectomy    . Total hip arthroplasty Right 05/28/2014    Procedure: TOTAL HIP ARTHROPLASTY ANTERIOR APPROACH;  Surgeon: Melrose Nakayama, MD;   Location: [place];  Service: Orthopedics;  Laterality: Right;  . Hardware removal Right 05/28/2014    Procedure: HARDWARE REMOVAL;  Surgeon: Melrose Nakayama, MD;  Location: [facility];  Service: Orthopedics;  Laterality: Right;   Social History:  reports that she has never smoked. She has never used smokeless tobacco. She reports that she drinks alcohol. She reports that she does not use illicit drugs.  Allergies  Allergen Reactions  . Macrodantin [Nitrofurantoin Macrocrystal] Other (See Comments)    Unknown allergic reaction many years ago/ tolerating 50 mg macrodantin   . Septra [Sulfamethoxazole-Trimethoprim] Other (See Comments)    Unknown allergic reaction many years ago   Family History  Problem Relation Age of Onset  . Heart disease Mother   . Cancer Father   . Emphysema Brother   . Alzheimer's disease Brother     Prior to Admission medications   Medication Sig Start Date End Date Taking? Authorizing Provider  acetaminophen (TYLENOL) 500 MG tablet Take 1,000 mg by mouth every 6 (six) hours as needed for moderate pain.    Yes Historical Provider, MD  aspirin EC 81 MG tablet Take 81 mg by mouth daily.   Yes Historical Provider, MD  Bilberry 1000 MG  CAPS Take 1,000 mg by mouth daily.    Yes Historical Provider, MD  Calcium Carbonate-Vitamin D (CALCIUM 600/VITAMIN D PO) Take 600 mg by mouth 2 (two) times daily.    Yes Historical Provider, MD  Calcium Polycarbophil (FIBERCON PO) Take 1 tablet by mouth 2 (two) times daily.    Yes Historical Provider, MD  cholecalciferol (VITAMIN D) 400 UNITS TABS Take 400 Units by mouth daily.    Yes Historical Provider, MD  CRANBERRY EXTRACT PO Take 1 tablet by mouth daily.   Yes Historical Provider, MD  fish oil-omega-3 fatty acids 1000 MG capsule Take 1 g by mouth daily.    Yes Historical Provider, MD  Glucosamine-Chondroitin (GLUCOSAMINE CHONDR COMPLEX PO) Take 1 tablet by mouth 2 (two) times daily. for joints   Yes Historical Provider, MD  mirabegron ER  (MYRBETRIQ) 25 MG TB24 tablet Take 25 mg by mouth daily.   Yes Historical Provider, MD  Multiple Vitamins-Minerals (PRESERVISION/LUTEIN) CAPS Take 1 capsule by mouth 2 (two) times daily.   Yes Historical Provider, MD  nitrofurantoin (MACRODANTIN) 50 MG capsule Take 50 mg by mouth at bedtime. Prophylaxis course (UTI's) 42 day course 04/26/14   Yes Historical Provider, MD  Probiotic Product (RESTORA) CAPS Take 1 capsule by mouth daily.   Yes Historical Provider, MD  venlafaxine (EFFEXOR) 75 MG tablet TAKE 1 TABLET ONCE DAILY FOR DEPRESSION. 05/20/14  Yes Estill Dooms, MD  zolpidem (AMBIEN) 5 MG tablet 1/2 by mouth daily at bedtime as needed for rest Patient taking differently: Take 2.5 mg by mouth at bedtime as needed for sleep.  03/21/14  Yes Estill Dooms, MD   Physical Exam: Blood pressure 130/52, pulse 85, temperature 98.5 F (36.9 C), temperature source Oral, resp. rate 16, height 5' (1.524 m), weight 59.875 kg (132 lb), SpO2 100 %. Filed Vitals:   05/29/14 1558 05/29/14 1857 05/29/14 2037 05/30/14 0629  BP: 121/46 145/57 141/51 130/52  Pulse: 86 87 86 85  Temp: 98.1 F (36.7 C) 98 F (36.7 C) 97.7 F (36.5 C) 98.5 F (36.9 C)  TempSrc: Axillary Oral  Oral  Resp: 16 18 16    Height:      Weight:      SpO2: 100% 100% 100% 100%     General:  Adult female, appears younger than stated age, obvious extensive bruising and swelling on her left face  Eyes:  PERRL, anicteric, non-injected.  ENT:  Nares clear.  OP clear, non-erythematous without plaques or exudates.  Dry MM  Neck:  Supple without TM or JVD.    Lymph:  No cervical, supraclavicular, or submandibular LAD.  Cardiovascular:  RRR, normal S1, S2, without m/r/g.  2+ pulses, warm extremities  Respiratory:  CTA bilaterally without increased WOB.  Abdomen:  NABS.  Soft, mildly distended, nontender  Skin:  No rashes.  Right hip bandages c/d/i with some light pink erythema, and soft tissue edema and mild bruising over the right  hip  Musculoskeletal:  Normal bulk and tone.  No LE edema.  Psychiatric:  A & O x 4.  Appropriate affect.  Neurologic:  CN 3-12 intact.  5/5 strength upper extremities and LLE.  Sensation intact.    Labs on Admission:  Basic Metabolic Panel:  Recent Labs Lab 05/29/14 0500 05/29/14 2025  NA 125* 120*  K 4.2 4.5  CL 92* 86*  CO2 27 26  GLUCOSE 150* 149*  BUN 10 8  CREATININE 0.71 0.55  CALCIUM 7.9* 8.0*   Liver Function Tests: No  results for input(s): AST, ALT, ALKPHOS, BILITOT, PROT, ALBUMIN in the last 168 hours. No results for input(s): LIPASE, AMYLASE in the last 168 hours. No results for input(s): AMMONIA in the last 168 hours. CBC:  Recent Labs Lab 05/29/14 0500 05/30/14 0533  WBC 8.8 11.6*  HGB 7.7* 10.1*  HCT 23.1* 30.1*  MCV 88.8 84.6  PLT 183 154   Cardiac Enzymes: No results for input(s): CKTOTAL, CKMB, CKMBINDEX, TROPONINI in the last 168 hours. BNP: Invalid input(s): POCBNP CBG: No results for input(s): GLUCAP in the last 168 hours.  Radiological Exams on Admission: Dg Hip Operative Unilat With Pelvis Right  05/28/2014   CLINICAL DATA:  Hardware removal with right hip replacement  EXAM: OPERATIVE right HIP (WITH PELVIS IF PERFORMED) 5 VIEWS  TECHNIQUE: Fluoroscopic spot image(s) were submitted for interpretation post-operatively.  FLUOROSCOPY TIME:  Radiation Exposure Index (as provided by the fluoroscopic device): Not available  If the device does not provide the exposure index:  Fluoroscopy Time:  35 seconds  Number of Acquired Images:  5  COMPARISON:  None.  FINDINGS: Compression screws are noted traversing the right femoral neck. Considerable remodeling of the right femoral head is noted. These screws for a subsequently removed and a total right hip replacement performed. No acute abnormality is noted.  IMPRESSION: Status post right hip replacement.   Electronically Signed   By: Inez Catalina M.D.   On: 05/28/2014 10:06    EKG: Normal sinus rhythm  with right bundle branch block  Time spent: 75 min  Christeena Krogh [facility] Pager [phone]  If 7PM-7AM, please contact night-coverage [email or web address] Password [facility] 05/30/2014, 8:32 AM

## 2014-05-30 NOTE — Clinical Social Work Note (Signed)
Clinical Social Work Assessment  Patient Details  Name: Cassandra Glenn MRN: [id number] Date of Birth: [birth year]  Date of referral:  05/30/14               Reason for consult:  Facility Placement, Discharge Planning                Permission sought to share information with:  Family Supports Permission granted to share information::  Yes, Verbal Permission Granted  Name::     Wilford Grist  Agency::     Relationship::  Adult Child, Daughter  Contact Information:  [phone]  Housing/Transportation Living arrangements for the past 2 months:  [place] ([place]) Source of Information:  Patient Patient Interpreter Needed:  None Criminal Activity/Legal Involvement Pertinent to Current Situation/Hospitalization:  No - Comment as needed Significant Relationships:  Adult Children Lives with:    Do you feel safe going back to the place where you live?  No (High fall risk.) Need for family participation in patient care:  Yes (Comment) (Patient's daughter active in patient's care.)  Care giving concerns:  Patient nor patient's daughter expressed any concerns regarding discharge at this time.   Social Worker assessment / plan:  Bundle patient. Per MD office RNCM, patient to discharge to [facility] once medically stable for discharge. CSW met with patient at bedside to discuss discharge disposition. Patient and patient's daughter confirmed discharge plan. CSW to continue to follow and assist with discharge planning needs.  Employment status:  Retired Forensic scientist:  Medicare PT Recommendations:  [place] / Referral to community resources:  [place]  Patient/Family's Response to care:  Patient and patient's daughter understanding and agreeable to CSW plan of care.  Patient/Family's Understanding of and Emotional Response to Diagnosis, Current Treatment, and Prognosis:  Patient and patient's daughter understanding  and agreeable to CSW plan of care.  Emotional Assessment Appearance:  Appears stated age Attitude/Demeanor/Rapport:  Other (Pleasant.) Affect (typically observed):  Accepting, Appropriate, Pleasant, Happy Orientation:  Oriented to Self, Oriented to Place, Oriented to  Time, Oriented to Situation Alcohol / Substance use:  Not Applicable Psych involvement (Current and /or in the community):  No (Comment) (Not appropriate on this admission.)  Discharge Needs  Concerns to be addressed:  No discharge needs identified Readmission within the last 30 days:  No Current discharge risk:  None Barriers to Discharge:  No Barriers Identified   Caroline Sauger, LCSW 05/30/2014, 3:48 PM [phone]

## 2014-05-30 NOTE — Progress Notes (Signed)
Patient voiding small amounts. At 2100 in and out cath was done got out 553ml of urine. Another in and out cath done at 0400 got out 869ml of urine. Patient voided small amounts in between. Patient does self in and out cath at home as needed. Will continue to monitor.

## 2014-05-30 NOTE — Progress Notes (Signed)
Physical Therapy Treatment Patient Details Name: Cassandra Glenn MRN: [id number] DOB: 1920/11/11 Today's Date: 05/30/2014    History of Present Illness Pt is a 81 y/o F s/p R THA, direct ant approach.  Pt's PMH includes urinary frequency, hyperflycemia, recurring UTI, compression fx of thoracic vertebra, breast cancer, macular degeneration, HOH, spinal stenosis w/ nerogenic claudication, trigger finger, R BBB, edema, lumbago, osteoporosis, hypotension, and SOB.    PT Comments    Patient is progressing well today with overall mobility. She is motivated and attempts what is asked of her. Complained of slight dizziness but subsided with rest back in the recliner. Continue to recommend SNF for ongoing Physical Therapy.     Follow Up Recommendations  SNF;Supervision/Assistance - 24 hour     Equipment Recommendations       Recommendations for Other Services       Precautions / Restrictions Precautions Precaution Comments: direct ant approach, no hip precautions.  Pt w/ episode of sycope on 05/28/14 Restrictions RLE Weight Bearing: Weight bearing as tolerated    Mobility  Bed Mobility Overal bed mobility: Needs Assistance       Supine to sit: Mod assist;HOB elevated     General bed mobility comments: Pt required HOB elevated, verbal and tactile cues for proper sequencing of BLEs to sitting EOB.  Mod use of bed rails and PT use of bed pad to scoots hips to sitting EOB.  Transfers Overall transfer level: Needs assistance Equipment used: Rolling walker (2 wheeled)   Sit to Stand: Min assist Stand pivot transfers: Min assist;+2 physical assistance       General transfer comment: Min A to power up into standing with cues for safe hand placement and technique. +2 Min A to shift weight to take small pivotal steps towards BSC then towards recliner.   Ambulation/Gait             General Gait Details: Pivotal steps only   Tree surgeon Rankin (Stroke Patients Only)       Balance                                    Cognition Arousal/Alertness: Awake/alert Behavior During Therapy: WFL for tasks assessed/performed Overall Cognitive Status: Within Functional Limits for tasks assessed                      Exercises Total Joint Exercises Quad Sets: AROM;Both;10 reps;Supine Heel Slides: AAROM;Right;Supine;10 reps Hip ABduction/ADduction: AAROM;Right;10 reps;Supine Long Arc Quad: AROM;Left;10 reps;Seated    General Comments        Pertinent Vitals/Pain Faces Pain Scale: Hurts a little bit Pain Location: R hip  Pain Descriptors / Indicators: Aching;Sore Pain Intervention(s): Monitored during session    Home Living                      Prior Function            PT Goals (current goals can now be found in the care plan section) Progress towards PT goals: Progressing toward goals    Frequency  7X/week    PT Plan Current plan remains appropriate    Co-evaluation             End of Session Equipment Utilized During Treatment: Gait belt Activity Tolerance: Patient tolerated treatment well;Patient  limited by fatigue Patient left: in chair;with call bell/phone within reach     Time: 0936-1001 PT Time Calculation (min) (ACUTE ONLY): 25 min  Charges:  $Therapeutic Exercise: 8-22 mins $Therapeutic Activity: 8-22 mins                    G Codes:      Jacqualyn Posey 05/30/2014, 11:27 AM 05/30/2014 Jacqualyn Posey PTA [phone] pager [phone] office

## 2014-05-31 DIAGNOSIS — M1611 Unilateral primary osteoarthritis, right hip: Secondary | ICD-10-CM

## 2014-05-31 DIAGNOSIS — E871 Hypo-osmolality and hyponatremia: Secondary | ICD-10-CM

## 2014-05-31 LAB — CBC
HCT: 30.1 % — ABNORMAL LOW (ref 36.0–46.0)
Hemoglobin: 10.1 g/dL — ABNORMAL LOW (ref 12.0–15.0)
MCH: 28.5 pg (ref 26.0–34.0)
MCHC: 33.6 g/dL (ref 30.0–36.0)
MCV: 85 fL (ref 78.0–100.0)
PLATELETS: 162 10*3/uL (ref 150–400)
RBC: 3.54 MIL/uL — AB (ref 3.87–5.11)
RDW: 15.6 % — AB (ref 11.5–15.5)
WBC: 10.1 10*3/uL (ref 4.0–10.5)

## 2014-05-31 LAB — BASIC METABOLIC PANEL
Anion gap: 6 (ref 5–15)
BUN: 7 mg/dL (ref 6–20)
CO2: 27 mmol/L (ref 22–32)
Calcium: 8 mg/dL — ABNORMAL LOW (ref 8.9–10.3)
Chloride: 94 mmol/L — ABNORMAL LOW (ref 101–111)
Creatinine, Ser: 0.52 mg/dL (ref 0.44–1.00)
GFR calc Af Amer: >60 mL/min (ref >60)
GFR calc non Af Amer: >60 mL/min (ref >60)
Glucose, Bld: 151 mg/dL — ABNORMAL HIGH (ref 65–99)
Potassium: 4.4 mmol/L (ref 3.5–5.1)
Sodium: 127 mmol/L — ABNORMAL LOW (ref 135–145)

## 2014-05-31 LAB — SODIUM
SODIUM: 128 mmol/L — AB (ref 135–145)
SODIUM: 127 mmol/L — AB (ref 135–145)
Sodium: 127 mmol/L — ABNORMAL LOW (ref 135–145)
Sodium: 128 mmol/L — ABNORMAL LOW (ref 135–145)
Sodium: 128 mmol/L — ABNORMAL LOW (ref 135–145)

## 2014-05-31 MED ORDER — TRAMADOL HCL 50 MG PO TABS: 100.0000 mg | ORAL_TABLET | Freq: Four times a day (QID) | ORAL | Status: DC | PRN

## 2014-05-31 MED ORDER — SENNOSIDES-DOCUSATE SODIUM 8.6-50 MG PO TABS
1.0000 | ORAL_TABLET | Freq: Two times a day (BID) | ORAL | Status: DC
  Administered 2014-05-31 – 2014-06-03 (×7): 1 via ORAL
  Filled 2014-05-31 (×7): qty 1

## 2014-05-31 MED ORDER — TIZANIDINE HCL 2 MG PO TABS: 2.0000 mg | ORAL_TABLET | Freq: Three times a day (TID) | ORAL | Status: DC | PRN

## 2014-05-31 MED ORDER — ASPIRIN 325 MG PO TBEC: 325.0000 mg | DELAYED_RELEASE_TABLET | Freq: Two times a day (BID) | ORAL | Status: DC

## 2014-05-31 NOTE — Consult Note (Addendum)
[facility] Medical Consultation Progress Note  Cassandra Glenn FFM:[id number] DOB: [birth year] DOA: 05/28/2014 PCP: Estill Dooms, MD   Requesting physician:  Loni Dolly Date of consultation:  05/30/2014 Reason for consultation: hyponatremia  Impression/Recommendations  Hyponatremia likely due to acute urinary retention, newly started venlafaxine, dehydration and possible underlying SIADH -  She has low serum osmolality and low urine osmolality, her U osm is << S osm suggesting excess free water. She has trace LE edema, will discontinue IVF and continue fluid restriction. Continue to monitor Na levels.  - TSH is borderline abnormal at 5.3, unlikely to have an impact to her Na levels, this needs to be repeated in 3-4 weeks as an outpatient - cortisol normal  - D/c venlafaxine and start again in a few weeks as outpatient with close lab supervision by PCP - Foley catheter placement  - Reduce narcotics if possible to reduce urinary retention and try voiding trial again in the morning  Arthritis status post right total hip arthroplasty - Management per orthopedics  - Adjustments to pain medication as above  Depression - She restarted venlafaxine 1 week ago - D/c venlafaxine and consider restarting as outpatient after she has recovered more since this may be worsening her hyponatremia  Leukocytosis, mild and likely related to recent surgery -  No signs of underlying infection at this time - WBC improved today   Constipation - senokot   Acute post-operative blood loss anemia s/p 2 unit blood transfusion with appropriate response to hemoglobin  Rest of chronic medical problems appear stable.  I will followup again tomorrow. Please contact me if I can be of assistance in the meanwhile. Thank you for this consultation.  HPI:  The patient is a 81 year old female with history of breast cancer in remission, spinal stenosis, arthritis with chronic pain, and SIADH who was  admitted for routine total right hip arthroplasty secondary to severe arthritis which was not responding to nonsurgical interventions. She was admitted on 5/16 and her postoperative course was complicated by acute blood loss anemia requiring 2 unit blood transfusion on 5/18.  She also had a syncopal episode and she has felt lightheaded since yesterday (although a little improved today).  Her preoperative labs on 5/11 demonstrated a sodium of 134. Her sodium on 5/18 was 125. She received a small volume of lactated Ringer's and blood transfusions on 5/18, however this morning her sodium has trended down to 120.  She feels thirsty and dry.  She has no history of heart failure or thyroid disease, but she just started taking venlafaxine about a week ago.  Additionally, she has had urinary retention requiring in and out catheterization with removal of 868mL of urine or more at times.  Currently, she denies lightheadedness, dizziness, nausea.  She has abdominal discomfort she attributes to constipation and to inability to void.  Her right hip is feeling a little better today. In November 2014 she developed severe hyponatremia with a sodium as low as 100. This was felt to be secondary to a combination of volume depletion and SIADH and she was treated with hypertonic saline under the supervision of nephrology.  Physical Exam: Blood pressure 129/47, pulse 86, temperature 98.2 F (36.8 C), temperature source Oral, resp. rate 18, height 5' (1.524 m), weight 59.875 kg (132 lb), SpO2 96 %. Filed Vitals:   05/30/14 1939 05/30/14 1945 05/31/14 0502 05/31/14 1313  BP: 125/35 117/41 109/35 129/47  Pulse: 86 85 86 86  Temp: 98.7 F (37.1 C) 98.7  F (37.1 C) 98.7 F (37.1 C) 98.2 F (36.8 C)  TempSrc: Oral Oral Oral   Resp: 17 17 18 18   Height:      Weight:      SpO2: 95% 97% 94% 96%    General:  NAD, bruise and swelling left side of her face  Eyes:  anicteric  Lymph:  No LAD  Cardiovascular:  RRR, normal S1,  S2, without m/r/g.  2+ pulses, warm extremities, trace edema  Respiratory:  CTA bilaterally without increased WOB.  Abdomen:  NABS.  Soft, mildly distended, nontender  Skin:  No rashes.  Right hip bandages c/d/i with some light pink erythema, and soft tissue edema and mild bruising over the right hip  Musculoskeletal:  Normal bulk and tone.  No LE edema.  Psychiatric:  A & O x 4.  Appropriate affect.  Neurologic:  Non focal     Labs on Admission:  Basic Metabolic Panel:  Recent Labs Lab 05/29/14 0500 05/29/14 2025  05/30/14 1456 05/30/14 1950 05/31/14 0028 05/31/14 0811 05/31/14 1150  NA 125* 120*  < > 123* 123* 127*  127* 128* 128*  K 4.2 4.5  --   --   --  4.4  --   --   CL 92* 86*  --   --   --  94*  --   --   CO2 27 26  --   --   --  27  --   --   GLUCOSE 150* 149*  --   --   --  151*  --   --   BUN 10 8  --   --   --  7  --   --   CREATININE 0.71 0.55  --   --   --  0.52  --   --   CALCIUM 7.9* 8.0*  --   --   --  8.0*  --   --   < > = values in this interval not displayed.  CBC:  Recent Labs Lab 05/29/14 0500 05/30/14 0533 05/31/14 0028  WBC 8.8 11.6* 10.1  HGB 7.7* 10.1* 10.1*  HCT 23.1* 30.1* 30.1*  MCV 88.8 84.6 85.0  PLT 183 154 162   Time spent: 25 minutes  Marzetta Board [facility] Pager [phone]  If 7PM-7AM, please contact night-coverage [email or web address] Password [email or web address] 05/31/2014, 1:29 PM

## 2014-05-31 NOTE — Progress Notes (Signed)
Physical Therapy Treatment Patient Details Name: KIELY COUSAR MRN: [id number] DOB: 06-20-1920 Today's Date: 05/31/2014    History of Present Illness Pt is a 81 y/o F s/p R THA, direct ant approach.  Pt's PMH includes urinary frequency, hyperflycemia, recurring UTI, compression fx of thoracic vertebra, breast cancer, macular degeneration, HOH, spinal stenosis w/ nerogenic claudication, trigger finger, R BBB, edema, lumbago, osteoporosis, hypotension, and SOB.    PT Comments    Patient making great progress this morning and is motivated to progress. Patient stated that she had just finished HEP with her daughter and daughter confirmed. Continue to work on ambulation. Continue to recommend SNF for ongoing Physical Therapy.     Follow Up Recommendations  SNF;Supervision/Assistance - 24 hour     Equipment Recommendations       Recommendations for Other Services       Precautions / Restrictions Precautions Precautions: Fall Precaution Comments: direct ant approach, no hip precautions.  Pt w/ episode of sycope on 05/28/14 Restrictions RLE Weight Bearing: Weight bearing as tolerated    Mobility  Bed Mobility Overal bed mobility: Needs Assistance Bed Mobility: Supine to Sit     Supine to sit: Mod assist     General bed mobility comments: Mod A for trunk support and R LE positioning OOB. Patient with good use of UEs  Transfers Overall transfer level: Needs assistance Equipment used: Rolling walker (2 wheeled)   Sit to Stand: Min assist         General transfer comment: Min A to power up into standing with cues for safe hand placement and technique.   Ambulation/Gait Ambulation/Gait assistance: Min assist;+2 safety/equipment Ambulation Distance (Feet): 16 Feet Assistive device: Rolling walker (2 wheeled) Gait Pattern/deviations: Step-to pattern;Decreased stance time - right;Decreased step length - left Gait velocity: decreased and guarded Gait velocity interpretation:  Below normal speed for age/gender General Gait Details: Cues for sequence and positioning of RW. A for balance and RW management   Stairs            Wheelchair Mobility    Modified Rankin (Stroke Patients Only)       Balance                                    Cognition Arousal/Alertness: Awake/alert Behavior During Therapy: WFL for tasks assessed/performed Overall Cognitive Status: Within Functional Limits for tasks assessed                      Exercises      General Comments        Pertinent Vitals/Pain Pain Score: 2  Pain Location: R hip  Pain Descriptors / Indicators: Aching;Sore Pain Intervention(s): Monitored during session;Repositioned    Home Living                      Prior Function            PT Goals (current goals can now be found in the care plan section) Progress towards PT goals: Progressing toward goals    Frequency  7X/week    PT Plan Current plan remains appropriate    Co-evaluation             End of Session   Activity Tolerance: Patient tolerated treatment well Patient left: in chair;with call bell/phone within reach;with family/visitor present     Time: 1010-1028 PT Time Calculation (min) (ACUTE  ONLY): 18 min  Charges:  $Gait Training: 8-22 mins                    G Codes:      Jacqualyn Posey 05/31/2014, 10:41 AM  05/31/2014 Jacqualyn Posey PTA [phone] pager [phone] office

## 2014-05-31 NOTE — Progress Notes (Signed)
Subjective: 3 Days Post-Op Procedure(s) (LRB): TOTAL HIP ARTHROPLASTY ANTERIOR APPROACH (Right) HARDWARE REMOVAL (Right)   Patient is resting comfortably in bed this morning. She is sitting up and eating breakfast. She states that she feels good and that she was up in the chair most of yesterday afternoon. She still has not done much walking with PT.  Activity level:  wbat Diet tolerance:  ok Voiding:  Foley in place Patient reports pain as mild.    Objective: Vital signs in last 24 hours: Temp:  [98.2 F (36.8 C)-98.7 F (37.1 C)] 98.7 F (37.1 C) (05/20 0502) Pulse Rate:  [85-88] 86 (05/20 0502) Resp:  [17-18] 18 (05/20 0502) BP: (109-125)/(35-46) 109/35 mmHg (05/20 0502) SpO2:  [92 %-97 %] 94 % (05/20 0502)  Labs:  Recent Labs  05/29/14 0500 05/30/14 0533 05/31/14 0028  HGB 7.7* 10.1* 10.1*    Recent Labs  05/30/14 0533 05/31/14 0028  WBC 11.6* 10.1  RBC 3.56* 3.54*  HCT 30.1* 30.1*  PLT 154 162    Recent Labs  05/29/14 2025  05/30/14 1950 05/31/14 0028  NA 120*  < > 123* 127*  127*  K 4.5  --   --  4.4  CL 86*  --   --  94*  CO2 26  --   --  27  BUN 8  --   --  7  CREATININE 0.55  --   --  0.52  GLUCOSE 149*  --   --  151*  CALCIUM 8.0*  --   --  8.0*  < > = values in this interval not displayed. No results for input(s): LABPT, INR in the last 72 hours.  Physical Exam:  Neurologically intact ABD soft Neurovascular intact Sensation intact distally Intact pulses distally Dorsiflexion/Plantar flexion intact Incision: dressing C/D/I and no drainage No cellulitis present Compartment soft  Assessment/Plan:  3 Days Post-Op Procedure(s) (LRB): TOTAL HIP ARTHROPLASTY ANTERIOR APPROACH (Right) HARDWARE REMOVAL (Right) Advance diet Up with therapy Discharge to SNF ([facility]) probably on Monday if medically stable and doing well in PT. Continue on ASA 325mg  BID x 4 weeks post op for DVT prevention. Continue current pain meds. We greatly  appreciate medical teams input and management. Follow up in office 2 weeks post op.   Kejon Feild, Larwance Sachs 05/31/2014, 7:39 AM

## 2014-05-31 NOTE — Discharge Instructions (Signed)

## 2014-06-01 LAB — BASIC METABOLIC PANEL
ANION GAP: 4 — AB (ref 5–15)
CALCIUM: 8.2 mg/dL — AB (ref 8.9–10.3)
CHLORIDE: 97 mmol/L — AB (ref 101–111)
CO2: 30 mmol/L (ref 22–32)
Creatinine, Ser: 0.49 mg/dL (ref 0.44–1.00)
GFR calc non Af Amer: >60 mL/min (ref >60)
Glucose, Bld: 118 mg/dL — ABNORMAL HIGH (ref 65–99)
POTASSIUM: 4.3 mmol/L (ref 3.5–5.1)
Sodium: 131 mmol/L — ABNORMAL LOW (ref 135–145)

## 2014-06-01 LAB — SODIUM
SODIUM: 129 mmol/L — AB (ref 135–145)
Sodium: 130 mmol/L — ABNORMAL LOW (ref 135–145)
Sodium: 127 mmol/L — ABNORMAL LOW (ref 135–145)

## 2014-06-01 MED ORDER — GLYCERIN (LAXATIVE) 2.1 G RE SUPP
1.0000 | RECTAL | Status: DC | PRN
  Administered 2014-06-01 – 2014-06-02 (×2): 1 via RECTAL
  Filled 2014-06-01 (×3): qty 1

## 2014-06-01 NOTE — Consult Note (Signed)
[facility] Medical Consultation Progress Note  Cassandra Glenn MPN:[id number] DOB: 1920/08/13 DOA: 05/28/2014 PCP: Estill Dooms, MD   Requesting physician:  Loni Dolly Date of consultation:  05/30/2014 Reason for consultation: hyponatremia  Impression/Recommendations  Hyponatremia likely due to acute urinary retention, newly started venlafaxine, dehydration and possible underlying SIADH -  She has low serum osmolality and low urine osmolality, her U osm is << S osm suggesting excess free water.  - sodium ~130 this morning, improved. Continue fluid restriction, no IVF - TSH is borderline abnormal at 5.3, unlikely to have an impact to her Na levels, this needs to be repeated in 3-4 weeks as an outpatient - cortisol normal  - D/c venlafaxine and start again in a few weeks as outpatient with close lab supervision by PCP - Foley catheter placement  - Reduce narcotics if possible to reduce urinary retention and try voiding trial again in the morning  Arthritis status post right total hip arthroplasty - Management per orthopedics  - Adjustments to pain medication as above  Depression - She restarted venlafaxine 1 week ago - D/c venlafaxine and consider restarting as outpatient after she has recovered more since this may be worsening her hyponatremia  Leukocytosis, mild and likely related to recent surgery - No signs of underlying infection at this time - WBC improved  Constipation - senokot   Acute post-operative blood loss anemia s/p 2 unit blood transfusion with appropriate response to hemoglobin  Rest of chronic medical problems appear stable.  I will followup again tomorrow. Please contact me if I can be of assistance in the meanwhile. Thank you for this consultation.  HPI:  The patient is a 81 year old female with history of breast cancer in remission, spinal stenosis, arthritis with chronic pain, and SIADH who was admitted for routine total right hip  arthroplasty secondary to severe arthritis which was not responding to nonsurgical interventions. She was admitted on 5/16 and her postoperative course was complicated by acute blood loss anemia requiring 2 unit blood transfusion on 5/18.  She also had a syncopal episode and she has felt lightheaded since yesterday (although a little improved today).  Her preoperative labs on 5/11 demonstrated a sodium of 134. Her sodium on 5/18 was 125. She received a small volume of lactated Ringer's and blood transfusions on 5/18, however this morning her sodium has trended down to 120.  She feels thirsty and dry.  She has no history of heart failure or thyroid disease, but she just started taking venlafaxine about a week ago.  Additionally, she has had urinary retention requiring in and out catheterization with removal of 880mL of urine or more at times.  Currently, she denies lightheadedness, dizziness, nausea.  She has abdominal discomfort she attributes to constipation and to inability to void.  Her right hip is feeling a little better today. In November 2014 she developed severe hyponatremia with a sodium as low as 100. This was felt to be secondary to a combination of volume depletion and SIADH and she was treated with hypertonic saline under the supervision of nephrology.  Physical Exam: Blood pressure 141/69, pulse 83, temperature 98 F (36.7 C), temperature source Oral, resp. rate 18, height 5' (1.524 m), weight 59.875 kg (132 lb), SpO2 97 %. Filed Vitals:   05/31/14 0502 05/31/14 1313 05/31/14 2049 06/01/14 0509  BP: 109/35 129/47 137/49 141/69  Pulse: 86 86 94 83  Temp: 98.7 F (37.1 C) 98.2 F (36.8 C) 99.4 F (37.4 C) 98 F (  36.7 C)  TempSrc: Oral  Oral   Resp: 18 18 17 18   Height:      Weight:      SpO2: 94% 96% 97% 97%    General:  NAD, bruise and swelling left side of her face  Eyes:  anicteric  Cardiovascular:  RRR, normal S1, S2, without m/r/g.  2+ pulses, warm extremities, trace  edema  Respiratory:  CTA bilaterally without increased WOB.  Skin:  No rashes.  Right hip bandages c/d/i with some light pink erythema, and soft tissue edema and mild bruising over the right hip  Musculoskeletal:  Normal bulk and tone.  No LE edema.  Psychiatric:  A & O x 4.  Appropriate affect.  Neurologic:  Non focal     Labs on Admission:  Basic Metabolic Panel:  Recent Labs Lab 05/29/14 0500 05/29/14 2025  05/31/14 0028  05/31/14 1555 05/31/14 2000 06/01/14 0051 06/01/14 0424 06/01/14 0803  NA 125* 120*  < > 127*  127*  < > 128* 127* 130* 131* 129*  K 4.2 4.5  --  4.4  --   --   --   --  4.3  --   CL 92* 86*  --  94*  --   --   --   --  97*  --   CO2 27 26  --  27  --   --   --   --  30  --   GLUCOSE 150* 149*  --  151*  --   --   --   --  118*  --   BUN 10 8  --  7  --   --   --   --  <5*  --   CREATININE 0.71 0.55  --  0.52  --   --   --   --  0.49  --   CALCIUM 7.9* 8.0*  --  8.0*  --   --   --   --  8.2*  --   < > = values in this interval not displayed.  CBC:  Recent Labs Lab 05/29/14 0500 05/30/14 0533 05/31/14 0028  WBC 8.8 11.6* 10.1  HGB 7.7* 10.1* 10.1*  HCT 23.1* 30.1* 30.1*  MCV 88.8 84.6 85.0  PLT 183 154 162   Time spent: 25 minutes, 50% bedside d/w daughter and patient  Marzetta Board [facility] Pager [phone]  If 7PM-7AM, please contact night-coverage [email or web address] Password [facility] 06/01/2014, 9:36 AM

## 2014-06-01 NOTE — Progress Notes (Signed)
MD, patient is requesting a suppository in order to have a BM.  She stated that a suppository works best for her.

## 2014-06-01 NOTE — Progress Notes (Signed)
Physical Therapy Treatment Patient Details Name: Cassandra Glenn MRN: [id number] DOB: Jul 04, 1920 Today's Date: 06/01/2014    History of Present Illness Pt is a 81 y/o F s/p R THA, direct ant approach.  Pt's PMH includes urinary frequency, hyperflycemia, recurring UTI, compression fx of thoracic vertebra, breast cancer, macular degeneration, HOH, spinal stenosis w/ nerogenic claudication, trigger finger, R BBB, edema, lumbago, osteoporosis, hypotension, and SOB.    PT Comments    Pt with c/o dizziness during gait limiting distance today to 30 feet. Pt progressing well.  Follow Up Recommendations  SNF;Supervision/Assistance - 24 hour     Equipment Recommendations  Rolling walker with 5" wheels    Recommendations for Other Services       Precautions / Restrictions Precautions Precautions: Fall Precaution Comments: direct ant approach, no hip precautions.  Pt w/ episode of sycope on 05/28/14 Restrictions RLE Weight Bearing: Weight bearing as tolerated    Mobility  Bed Mobility         Supine to sit: Min assist;HOB elevated     General bed mobility comments: use of bedrail; assist for trunk support and RLE  Transfers   Equipment used: Rolling walker (2 wheeled)   Sit to Stand: Min assist Stand pivot transfers: Min assist       General transfer comment: verbal cues for sequencing and hand placement  Ambulation/Gait Ambulation/Gait assistance: Min assist Ambulation Distance (Feet): 30 Feet Assistive device: Rolling walker (2 wheeled) Gait Pattern/deviations: Step-to pattern;Decreased stride length Gait velocity: decreased and guarded   General Gait Details: verbal cues for sequencning   Stairs            Wheelchair Mobility    Modified Rankin (Stroke Patients Only)       Balance                                    Cognition Arousal/Alertness: Awake/alert Behavior During Therapy: WFL for tasks assessed/performed Overall Cognitive  Status: Within Functional Limits for tasks assessed                      Exercises Total Joint Exercises Ankle Circles/Pumps: AROM;Both;10 reps Quad Sets: AROM;Both;5 reps    General Comments        Pertinent Vitals/Pain Pain Assessment: 0-10 Pain Score: 2  Pain Location: R hip Pain Intervention(s): Monitored during session;Repositioned    Home Living                      Prior Function            PT Goals (current goals can now be found in the care plan section) Acute Rehab PT Goals Patient Stated Goal: to move as much as she can PT Goal Formulation: With patient/family Time For Goal Achievement: 06/04/14 Potential to Achieve Goals: Good Progress towards PT goals: Progressing toward goals    Frequency  7X/week    PT Plan Current plan remains appropriate    Co-evaluation             End of Session Equipment Utilized During Treatment: Gait belt Activity Tolerance: Patient tolerated treatment well Patient left: Other (comment);with family/visitor present;with call bell/phone within reach (on toilet in bathroom)     Time: 1010-1043 PT Time Calculation (min) (ACUTE ONLY): 33 min  Charges:  $Gait Training: 8-22 mins $Therapeutic Activity: 8-22 mins  G Codes:      Lorriane Shire 06/01/2014, 11:06 AM

## 2014-06-01 NOTE — Progress Notes (Signed)
   PATIENT ID: Cassandra Glenn   4 Days Post-Op Procedure(s) (LRB): TOTAL HIP ARTHROPLASTY ANTERIOR APPROACH (Right) HARDWARE REMOVAL (Right)  Subjective: Comfortable today, sitting up in bed eating. Feels progress with PT but increased soreness and believes she may have "overdone it". No other complaints or concerns.  Objective:  Filed Vitals:   06/01/14 0509  BP: 141/69  Pulse: 83  Temp: 98 F (36.7 C)  Resp: 18     Physical Exam: Neurologically intact Neurovascular intact Sensation intact distally Intact pulses distally Dorsiflexion/Plantar flexion intact Incision: dressing with blood, no drainage No cellulitis present Compartment soft  Labs:   Recent Labs  05/30/14 0533 05/31/14 0028  HGB 10.1* 10.1*   Recent Labs  05/30/14 0533 05/31/14 0028  WBC 11.6* 10.1  RBC 3.56* 3.54*  HCT 30.1* 30.1*  PLT 154 162   Recent Labs  05/31/14 0028  06/01/14 0051 06/01/14 0424  NA 127*  127*  < > 130* 131*  K 4.4  --   --  4.3  CL 94*  --   --  97*  CO2 27  --   --  30  BUN 7  --   --  <5*  CREATININE 0.52  --   --  0.49  GLUCOSE 151*  --   --  118*  CALCIUM 8.0*  --   --  8.2*  < > = values in this interval not displayed.  Assessment and Plan: 4 Days Post-Op Procedure(s) (LRB): TOTAL HIP ARTHROPLASTY ANTERIOR APPROACH (Right) HARDWARE REMOVAL (Right) Advance diet Up with therapy Discharge to SNF ([facility]) probably on Monday if medically stable and doing well in PT. Will likely need dressing change prior to d/c on Monday but will wait for Dr. Rhona Raider to examine Continue on ASA 325mg  BID x 4 weeks post op for DVT prevention. Continue current pain meds. We greatly appreciate medical teams input and management. Follow up in office 2 weeks post op.

## 2014-06-02 DIAGNOSIS — M7989 Other specified soft tissue disorders: Secondary | ICD-10-CM

## 2014-06-02 DIAGNOSIS — K5900 Constipation, unspecified: Secondary | ICD-10-CM

## 2014-06-02 LAB — BASIC METABOLIC PANEL
Anion gap: 6 (ref 5–15)
BUN: 6 mg/dL (ref 6–20)
CHLORIDE: 93 mmol/L — AB (ref 101–111)
CO2: 30 mmol/L (ref 22–32)
CREATININE: 0.58 mg/dL (ref 0.44–1.00)
Calcium: 8.4 mg/dL — ABNORMAL LOW (ref 8.9–10.3)
GFR calc Af Amer: >60 mL/min (ref >60)
GFR calc non Af Amer: >60 mL/min (ref >60)
GLUCOSE: 122 mg/dL — AB (ref 65–99)
Potassium: 4.2 mmol/L (ref 3.5–5.1)
SODIUM: 129 mmol/L — AB (ref 135–145)

## 2014-06-02 LAB — SODIUM: Sodium: 128 mmol/L — ABNORMAL LOW (ref 135–145)

## 2014-06-02 MED ORDER — SORBITOL 70 % SOLN
960.0000 mL | TOPICAL_OIL | Freq: Once | ORAL | Status: DC
  Filled 2014-06-02: qty 240

## 2014-06-02 NOTE — Consult Note (Signed)
[facility] Medical Consultation Progress Note  AISLINN FELIZ PQZ:[id number] DOB: January 01, 1921 DOA: 05/28/2014 PCP: Estill Dooms, MD   Requesting physician:  Loni Dolly Date of consultation:  05/30/2014 Reason for consultation: hyponatremia  Subjective - doing well - complains of constipation - complains of right calf sweling  Impression/Recommendations  Hyponatremia likely due to acute urinary retention, newly started venlafaxine, dehydration and possible underlying SIADH - She has low serum osmolality and low urine osmolality, her U osm is << S osm suggesting excess free water.  - sodium stable. Continue fluid restriction, no IVF - TSH is borderline abnormal at 5.3, unlikely to have an impact to her Na levels, this needs to be repeated in 3-4 weeks as an outpatient - cortisol normal  - D/c venlafaxine and start again in a few weeks as outpatient with close lab supervision by PCP - Foley catheter placement, will discuss with Dr. Louis Meckel tomorrow whether she should have this chronic  Right leg swelling - new - obtain venous doppler  Arthritis status post right total hip arthroplasty - Management per orthopedics  - Adjustments to pain medication as above  Depression - She restarted venlafaxine 1 week ago - D/c venlafaxine and consider restarting as outpatient after she has recovered more since this may be worsening her hyponatremia  Leukocytosis, mild and likely related to recent surgery - No signs of underlying infection at this time - WBC improved  Constipation - enema today    Acute post-operative blood loss anemia s/p 2 unit blood transfusion with appropriate response to hemoglobin  Rest of chronic medical problems appear stable.  I will followup again tomorrow. Please contact me if I can be of assistance in the meanwhile. Thank you for this consultation.  HPI:  The patient is a 81 year old female with history of breast cancer in remission, spinal  stenosis, arthritis with chronic pain, and SIADH who was admitted for routine total right hip arthroplasty secondary to severe arthritis which was not responding to nonsurgical interventions. She was admitted on 5/16 and her postoperative course was complicated by acute blood loss anemia requiring 2 unit blood transfusion on 5/18.  She also had a syncopal episode and she has felt lightheaded since yesterday (although a little improved today).  Her preoperative labs on 5/11 demonstrated a sodium of 134. Her sodium on 5/18 was 125. She received a small volume of lactated Ringer's and blood transfusions on 5/18, however this morning her sodium has trended down to 120.  She feels thirsty and dry.  She has no history of heart failure or thyroid disease, but she just started taking venlafaxine about a week ago.  Additionally, she has had urinary retention requiring in and out catheterization with removal of 855mL of urine or more at times.  Currently, she denies lightheadedness, dizziness, nausea.  She has abdominal discomfort she attributes to constipation and to inability to void.  Her right hip is feeling a little better today. In November 2014 she developed severe hyponatremia with a sodium as low as 100. This was felt to be secondary to a combination of volume depletion and SIADH and she was treated with hypertonic saline under the supervision of nephrology.  Physical Exam: Blood pressure 145/53, pulse 71, temperature 98.1 F (36.7 C), temperature source Oral, resp. rate 18, height 5' (1.524 m), weight 59.875 kg (132 lb), SpO2 96 %. Filed Vitals:   06/01/14 0509 06/01/14 1253 06/01/14 2012 06/02/14 0437  BP: 141/69 148/74 152/55 145/53  Pulse: 83 98 93  71  Temp: 98 F (36.7 C) 98.6 F (37 C) 98 F (36.7 C) 98.1 F (36.7 C)  TempSrc:  Oral Oral Oral  Resp: 18  18 18   Height:      Weight:      SpO2: 97% 100% 97% 96%    General:  NAD, bruise and swelling left side of her face  Eyes:   anicteric  Cardiovascular:  RRR, normal S1, S2, without m/r/g.  2+ pulses, warm extremities, trace edema  Respiratory:  CTA bilaterally without increased WOB.  Skin:  No rashes.   Musculoskeletal:  Normal bulk and tone.  No LE edema.  Psychiatric:  A & O x 4.  Appropriate affect.  Neurologic:  Non focal     Labs on Admission:  Basic Metabolic Panel:  Recent Labs Lab 05/29/14 0500 05/29/14 2025  05/31/14 0028  06/01/14 0051 06/01/14 0424 06/01/14 0803 06/01/14 1842 06/02/14 0550  NA 125* 120*  < > 127*  127*  < > 130* 131* 129* 127* 129*  K 4.2 4.5  --  4.4  --   --  4.3  --   --  4.2  CL 92* 86*  --  94*  --   --  97*  --   --  93*  CO2 27 26  --  27  --   --  30  --   --  30  GLUCOSE 150* 149*  --  151*  --   --  118*  --   --  122*  BUN 10 8  --  7  --   --  <5*  --   --  6  CREATININE 0.71 0.55  --  0.52  --   --  0.49  --   --  0.58  CALCIUM 7.9* 8.0*  --  8.0*  --   --  8.2*  --   --  8.4*  < > = values in this interval not displayed.  CBC:  Recent Labs Lab 05/29/14 0500 05/30/14 0533 05/31/14 0028  WBC 8.8 11.6* 10.1  HGB 7.7* 10.1* 10.1*  HCT 23.1* 30.1* 30.1*  MCV 88.8 84.6 85.0  PLT 183 154 162   Time spent: 25 minutes, 50% bedside d/w daughter and patient  Marzetta Board [facility] Pager [phone]  If 7PM-7AM, please contact night-coverage [email or web address] Password [facility] 06/02/2014, 12:21 PM

## 2014-06-02 NOTE — Progress Notes (Signed)
   PATIENT ID: Cassandra Glenn Mode   5 Days Post-Op Procedure(s) (LRB): TOTAL HIP ARTHROPLASTY ANTERIOR APPROACH (Right) HARDWARE REMOVAL (Right)  Subjective: Her main complaint is constipation at this point. She was given a suppository last night but has not yet had a bowel movement. She has been up walking with physical therapy.  Objective:  Filed Vitals:   06/02/14 0437  BP: 145/53  Pulse: 71  Temp: 98.1 F (36.7 C)  Resp: 18     Right anterior hip dressing with some dried blood. No significant swelling. Leg lengths equal, distally neurovascularly intact.  Labs:   Recent Labs  05/31/14 0028  HGB 10.1*   Recent Labs  05/31/14 0028  WBC 10.1  RBC 3.54*  HCT 30.1*  PLT 162   Recent Labs  06/01/14 0424  06/01/14 1842 06/02/14 0550  NA 131*  < > 127* 129*  K 4.3  --   --  4.2  CL 97*  --   --  93*  CO2 30  --   --  30  BUN <5*  --   --  6  CREATININE 0.49  --   --  0.58  GLUCOSE 118*  --   --  122*  CALCIUM 8.2*  --   --  8.4*  < > = values in this interval not displayed.  Assessment and Plan: Doing well after right anterior hip replacement Constipation: She had a suppository last night and is getting MiraLAX regularly. She is getting up and ambulating more now. Hopefully today we will see some progress. Hyponatremia: Appreciate hospitalist management. Stable, improving slowly Disposition: Likely to skilled nursing facility tomorrow.

## 2014-06-02 NOTE — Progress Notes (Signed)
Physical Therapy Treatment Patient Details Name: Cassandra Glenn MRN: [id number] DOB: 15-Sep-1920 Today's Date: 06/02/2014    History of Present Illness Pt is a 81 y/o F s/p R THA, direct ant approach.  Pt's PMH includes urinary frequency, hyperflycemia, recurring UTI, compression fx of thoracic vertebra, breast cancer, macular degeneration, HOH, spinal stenosis w/ nerogenic claudication, trigger finger, R BBB, edema, lumbago, osteoporosis, hypotension, and SOB.    PT Comments    Pt with slow, steady progress. Possible d/c to SNF tomorrow.  Follow Up Recommendations  SNF;Supervision/Assistance - 24 hour     Equipment Recommendations  Rolling walker with 5" wheels    Recommendations for Other Services       Precautions / Restrictions Precautions Precautions: Fall Precaution Comments: direct ant approach, no hip precautions.  Pt w/ episode of sycope on 05/28/14 Restrictions RLE Weight Bearing: Weight bearing as tolerated    Mobility  Bed Mobility         Supine to sit: Min assist;HOB elevated     General bed mobility comments: use of bedrail, verbal cues for sequencing  Transfers   Equipment used: Rolling walker (2 wheeled)   Sit to Stand: Min assist         General transfer comment: verbal cues for sequencing and hand placement  Ambulation/Gait Ambulation/Gait assistance: Min assist Ambulation Distance (Feet): 45 Feet Assistive device: Rolling walker (2 wheeled) Gait Pattern/deviations: Step-to pattern;Decreased stride length Gait velocity: decreased and guarded       Stairs            Wheelchair Mobility    Modified Rankin (Stroke Patients Only)       Balance                                    Cognition Arousal/Alertness: Awake/alert Behavior During Therapy: WFL for tasks assessed/performed Overall Cognitive Status: Within Functional Limits for tasks assessed                      Exercises Total Joint  Exercises Ankle Circles/Pumps: AROM;Both;10 reps    General Comments        Pertinent Vitals/Pain Pain Assessment: 0-10 Pain Score: 2  Pain Location: R hip Pain Intervention(s): Monitored during session;Premedicated before session    Home Living                      Prior Function            PT Goals (current goals can now be found in the care plan section) Acute Rehab PT Goals Patient Stated Goal: to move as much as she can PT Goal Formulation: With patient/family Time For Goal Achievement: 06/04/14 Potential to Achieve Goals: Good Progress towards PT goals: Progressing toward goals    Frequency  7X/week    PT Plan Current plan remains appropriate    Co-evaluation             End of Session Equipment Utilized During Treatment: Gait belt Activity Tolerance: Patient tolerated treatment well Patient left: in chair;with family/visitor present;with call bell/phone within reach     Time: 1114-1141 PT Time Calculation (min) (ACUTE ONLY): 27 min  Charges:  $Gait Training: 23-37 mins                    G Codes:      Lorriane Shire 06/02/2014, 1:32 PM

## 2014-06-03 ENCOUNTER — Inpatient Hospital Stay (HOSPITAL_COMMUNITY): Payer: Medicare Other

## 2014-06-03 DIAGNOSIS — D62 Acute posthemorrhagic anemia: Secondary | ICD-10-CM | POA: Diagnosis not present

## 2014-06-03 DIAGNOSIS — M7989 Other specified soft tissue disorders: Secondary | ICD-10-CM | POA: Diagnosis not present

## 2014-06-03 DIAGNOSIS — M810 Age-related osteoporosis without current pathological fracture: Secondary | ICD-10-CM | POA: Diagnosis not present

## 2014-06-03 DIAGNOSIS — R350 Frequency of micturition: Secondary | ICD-10-CM | POA: Diagnosis not present

## 2014-06-03 DIAGNOSIS — R338 Other retention of urine: Secondary | ICD-10-CM

## 2014-06-03 DIAGNOSIS — E871 Hypo-osmolality and hyponatremia: Secondary | ICD-10-CM | POA: Diagnosis not present

## 2014-06-03 DIAGNOSIS — R262 Difficulty in walking, not elsewhere classified: Secondary | ICD-10-CM | POA: Diagnosis not present

## 2014-06-03 DIAGNOSIS — K5900 Constipation, unspecified: Secondary | ICD-10-CM | POA: Diagnosis not present

## 2014-06-03 DIAGNOSIS — R41841 Cognitive communication deficit: Secondary | ICD-10-CM | POA: Diagnosis not present

## 2014-06-03 DIAGNOSIS — R1314 Dysphagia, pharyngoesophageal phase: Secondary | ICD-10-CM | POA: Diagnosis not present

## 2014-06-03 DIAGNOSIS — S129XXS Fracture of neck, unspecified, sequela: Secondary | ICD-10-CM | POA: Diagnosis not present

## 2014-06-03 DIAGNOSIS — D72829 Elevated white blood cell count, unspecified: Secondary | ICD-10-CM | POA: Diagnosis not present

## 2014-06-03 DIAGNOSIS — H3530 Unspecified macular degeneration: Secondary | ICD-10-CM | POA: Diagnosis not present

## 2014-06-03 DIAGNOSIS — Z96641 Presence of right artificial hip joint: Secondary | ICD-10-CM | POA: Diagnosis not present

## 2014-06-03 DIAGNOSIS — N390 Urinary tract infection, site not specified: Secondary | ICD-10-CM | POA: Diagnosis not present

## 2014-06-03 DIAGNOSIS — G4700 Insomnia, unspecified: Secondary | ICD-10-CM | POA: Diagnosis not present

## 2014-06-03 DIAGNOSIS — R339 Retention of urine, unspecified: Secondary | ICD-10-CM | POA: Diagnosis not present

## 2014-06-03 DIAGNOSIS — M1611 Unilateral primary osteoarthritis, right hip: Secondary | ICD-10-CM | POA: Diagnosis not present

## 2014-06-03 DIAGNOSIS — M6281 Muscle weakness (generalized): Secondary | ICD-10-CM | POA: Diagnosis not present

## 2014-06-03 LAB — BASIC METABOLIC PANEL
ANION GAP: 6 (ref 5–15)
BUN: <5 mg/dL — ABNORMAL LOW (ref 6–20)
CO2: 28 mmol/L (ref 22–32)
Calcium: 8.3 mg/dL — ABNORMAL LOW (ref 8.9–10.3)
Chloride: 95 mmol/L — ABNORMAL LOW (ref 101–111)
Creatinine, Ser: 0.48 mg/dL (ref 0.44–1.00)
GFR calc Af Amer: >60 mL/min (ref >60)
GFR calc non Af Amer: >60 mL/min (ref >60)
GLUCOSE: 107 mg/dL — AB (ref 65–99)
Potassium: 4.2 mmol/L (ref 3.5–5.1)
SODIUM: 129 mmol/L — AB (ref 135–145)

## 2014-06-03 NOTE — Discharge Summary (Signed)
Patient ID: Cassandra Glenn MRN: [id number] DOB/AGE: 07/08/1920 81 y.o.  Admit date: 05/28/2014 Discharge date: 06/03/2014  Admission Diagnoses:  Principal Problem:   Primary osteoarthritis of right hip Active Problems:   Hyponatremia   Postoperative anemia due to acute blood loss   Acute urinary retention   Leukocytosis   Discharge Diagnoses:  Same  Past Medical History  Diagnosis Date  . Macular degeneration   . HOH (hard of hearing)   . Vaginitis and vulvovaginitis 02/29/2012  . Abnormalities of the hair 02/29/2012  . Spinal stenosis, lumbar region, with neurogenic claudication 11/02/2011  . Trigger finger (acquired) 11/02/2011  . Right bundle branch block 08/30/2011  . Edema 05/07/2011  . Contact dermatitis and other eczema, due to unspecified cause 05/07/2011  . Hypopotassemia 03/26/2011  . Candidiasis of skin and nails 03/19/2011  . Hyposmolality and/or hyponatremia 03/19/2011  . Diverticulosis of colon (without mention of hemorrhage) 03/19/2011  . Unspecified constipation 03/19/2011  . Pain in joint, pelvic region and thigh 03/19/2011  . Lumbago 03/19/2011  . Spasm of muscle 03/19/2011  . Osteoporosis, unspecified 03/19/2011  . Insomnia, unspecified 03/19/2011  . Other malaise and fatigue 03/19/2011  . Impaired fasting glucose 03/19/2011  . Hypotension, unspecified 03/18/2011  . Closed fracture of base of neck of femur 03/18/2011  . Arthritis   . Shortness of breath   . Kidney infection   . Cancer   . Malignant neoplasm of breast (female), unspecified site 10/28/2002    Surgeries: Procedure(s): TOTAL HIP ARTHROPLASTY ANTERIOR APPROACH HARDWARE REMOVAL on 05/28/2014   Consultants:    Discharged Condition: Improved  Hospital Course: Cassandra Glenn is an 81 y.o. female who was admitted 05/28/2014 for operative treatment ofPrimary osteoarthritis of right hip. Patient has severe unremitting pain that affects sleep, daily activities, and work/hobbies. After  pre-op clearance the patient was taken to the operating room on 05/28/2014 and underwent  Procedure(s): TOTAL HIP ARTHROPLASTY ANTERIOR APPROACH HARDWARE REMOVAL.    Patient was given perioperative antibiotics: Anti-infectives    Start     Dose/Rate Route Frequency Ordered Stop   05/28/14 1400  ceFAZolin (ANCEF) IVPB 2 g/50 mL premix     2 g 100 mL/hr over 30 Minutes Intravenous Every 6 hours 05/28/14 1126 05/28/14 2232   05/28/14 0700  ceFAZolin (ANCEF) IVPB 2 g/50 mL premix     2 g 100 mL/hr over 30 Minutes Intravenous To Surgery 05/27/14 1433 05/28/14 0750       Patient was given sequential compression devices, early ambulation, and chemoprophylaxis to prevent DVT.  Patient had hyponatremia and was evaluated and treated by the medical team.  She also had acute postoperative blood loss anemia nd was given 2 units of blood which she responded to very well.  Patient benefited maximally from hospital stay and there were no complications.    Recent vital signs: Patient Vitals for the past 24 hrs:  BP Temp Temp src Pulse Resp SpO2  06/03/14 0536 (!) 131/59 mmHg 98.2 F (36.8 C) Oral 88 18 96 %  06/02/14 2012 (!) 159/58 mmHg 98.1 F (36.7 C) Oral 80 18 92 %  06/02/14 1410 (!) 148/69 mmHg 98.6 F (37 C) Oral 82 18 98 %     Recent laboratory studies:  Recent Labs  06/02/14 0550 06/02/14 1805 06/03/14 0525  NA 129* 128* 129*  K 4.2  --  4.2  CL 93*  --  95*  CO2 30  --  28  BUN 6  --  <5*  CREATININE 0.58  --  0.48  GLUCOSE 122*  --  107*  CALCIUM 8.4*  --  8.3*     Discharge Medications:     Medication List    STOP taking these medications        venlafaxine 75 MG tablet  Commonly known as:  EFFEXOR      TAKE these medications        acetaminophen 500 MG tablet  Commonly known as:  TYLENOL  Take 1,000 mg by mouth every 6 (six) hours as needed for moderate pain.     aspirin 325 MG EC tablet  Take 1 tablet (325 mg total) by mouth 2 (two) times daily after a  meal.     Bilberry 1000 MG Caps  Take 1,000 mg by mouth daily.     CALCIUM 600/VITAMIN D PO  Take 600 mg by mouth 2 (two) times daily.     cholecalciferol 400 UNITS Tabs tablet  Commonly known as:  VITAMIN D  Take 400 Units by mouth daily.     CRANBERRY EXTRACT PO  Take 1 tablet by mouth daily.     FIBERCON PO  Take 1 tablet by mouth 2 (two) times daily.     fish oil-omega-3 fatty acids 1000 MG capsule  Take 1 g by mouth daily.     GLUCOSAMINE CHONDR COMPLEX PO  Take 1 tablet by mouth 2 (two) times daily. for joints     mirabegron ER 25 MG Tb24 tablet  Commonly known as:  MYRBETRIQ  Take 25 mg by mouth daily.     nitrofurantoin 50 MG capsule  Commonly known as:  MACRODANTIN  Take 50 mg by mouth at bedtime. Prophylaxis course (UTI's) 42 day course 04/26/14     PRESERVISION/LUTEIN Caps  Take 1 capsule by mouth 2 (two) times daily.     RESTORA Caps  Take 1 capsule by mouth daily.     tiZANidine 2 MG tablet  Commonly known as:  ZANAFLEX  Take 1 tablet (2 mg total) by mouth every 8 (eight) hours as needed for muscle spasms.     traMADol 50 MG tablet  Commonly known as:  ULTRAM  Take 2 tablets (100 mg total) by mouth every 6 (six) hours as needed for moderate pain or severe pain.     zolpidem 5 MG tablet  Commonly known as:  AMBIEN  1/2 by mouth daily at bedtime as needed for rest        Diagnostic Studies: Dg Chest 1 View  05/22/2014   CLINICAL DATA:  Recent fall with chest pain  EXAM: CHEST  1 VIEW  COMPARISON:  10/16/2013  FINDINGS: Cardiac shadow is within normal limits. The lungs are well aerated bilaterally. Some stable scarring is noted in the apices bilaterally. No focal infiltrate or sizable effusion is seen. Bony structures are within normal limits.  IMPRESSION: Chronic apical changes without acute abnormality.   Electronically Signed   By: Inez Catalina M.D.   On: 05/22/2014 14:07   Ct Head Wo Contrast  05/22/2014   CLINICAL DATA:  Scalp hematoma after  dizziness and fall at nursing facility.  EXAM: CT HEAD WITHOUT CONTRAST  CT CERVICAL SPINE WITHOUT CONTRAST  TECHNIQUE: Multidetector CT imaging of the head and cervical spine was performed following the standard protocol without intravenous contrast. Multiplanar CT image reconstructions of the cervical spine were also generated.  COMPARISON:  CT scan of head of November 19, 2004.  FINDINGS: CT HEAD FINDINGS  Bony calvarium  appears intact. Mild left frontal scalp hematoma is noted. Mild diffuse cortical atrophy is noted. Mild chronic ischemic white matter disease is noted. No mass effect or midline shift is noted. Ventricular size is within normal limits. There is no evidence of mass lesion, hemorrhage or acute infarction.  CT CERVICAL SPINE FINDINGS  No fracture is noted. Minimal grade 1 retrolisthesis of C3-4 is noted secondary to severe degenerative disc disease at this level. Severe degenerative disc disease is also noted at C4-5 and C5-6. Moderate degenerative disc disease is noted at C6-7 with posterior osteophyte formation. Mild degenerative changes seen involving posterior facet joints of C7-T1. Biapical scarring is noted.  IMPRESSION: Mild left frontal scalp hematoma. Mild diffuse cortical atrophy. Mild chronic ischemic white matter disease. No acute intracranial abnormality seen.  Multilevel degenerative disc disease. No acute abnormality seen in the cervical spine.   Electronically Signed   By: Marijo Conception, M.D.   On: 05/22/2014 13:55   Ct Cervical Spine Wo Contrast  05/22/2014   CLINICAL DATA:  Scalp hematoma after dizziness and fall at nursing facility.  EXAM: CT HEAD WITHOUT CONTRAST  CT CERVICAL SPINE WITHOUT CONTRAST  TECHNIQUE: Multidetector CT imaging of the head and cervical spine was performed following the standard protocol without intravenous contrast. Multiplanar CT image reconstructions of the cervical spine were also generated.  COMPARISON:  CT scan of head of November 19, 2004.   FINDINGS: CT HEAD FINDINGS  Bony calvarium appears intact. Mild left frontal scalp hematoma is noted. Mild diffuse cortical atrophy is noted. Mild chronic ischemic white matter disease is noted. No mass effect or midline shift is noted. Ventricular size is within normal limits. There is no evidence of mass lesion, hemorrhage or acute infarction.  CT CERVICAL SPINE FINDINGS  No fracture is noted. Minimal grade 1 retrolisthesis of C3-4 is noted secondary to severe degenerative disc disease at this level. Severe degenerative disc disease is also noted at C4-5 and C5-6. Moderate degenerative disc disease is noted at C6-7 with posterior osteophyte formation. Mild degenerative changes seen involving posterior facet joints of C7-T1. Biapical scarring is noted.  IMPRESSION: Mild left frontal scalp hematoma. Mild diffuse cortical atrophy. Mild chronic ischemic white matter disease. No acute intracranial abnormality seen.  Multilevel degenerative disc disease. No acute abnormality seen in the cervical spine.   Electronically Signed   By: Marijo Conception, M.D.   On: 05/22/2014 13:55   Dg Hip Operative Unilat With Pelvis Right  05/28/2014   CLINICAL DATA:  Hardware removal with right hip replacement  EXAM: OPERATIVE right HIP (WITH PELVIS IF PERFORMED) 5 VIEWS  TECHNIQUE: Fluoroscopic spot image(s) were submitted for interpretation post-operatively.  FLUOROSCOPY TIME:  Radiation Exposure Index (as provided by the fluoroscopic device): Not available  If the device does not provide the exposure index:  Fluoroscopy Time:  35 seconds  Number of Acquired Images:  5  COMPARISON:  None.  FINDINGS: Compression screws are noted traversing the right femoral neck. Considerable remodeling of the right femoral head is noted. These screws for a subsequently removed and a total right hip replacement performed. No acute abnormality is noted.  IMPRESSION: Status post right hip replacement.   Electronically Signed   By: Inez Catalina M.D.    On: 05/28/2014 10:06    Disposition: 01-Home or Self Care      Discharge Instructions    Call MD / Call 911    Complete by:  As directed   If you experience chest pain or  shortness of breath, CALL 911 and be transported to the hospital emergency room.  If you develope a fever above 101 F, pus (white drainage) or increased drainage or redness at the wound, or calf pain, call your surgeon's office.     Constipation Prevention    Complete by:  As directed   Drink plenty of fluids.  Prune juice may be helpful.  You may use a stool softener, such as Colace (over the counter) 100 mg twice a day.  Use MiraLax (over the counter) for constipation as needed.     Diet - low sodium heart healthy    Complete by:  As directed      Discharge instructions    Complete by:  As directed   INSTRUCTIONS AFTER JOINT REPLACEMENT   Remove items at home which could result in a fall. This includes throw rugs or furniture in walking pathways ICE to the affected joint every three hours while awake for 30 minutes at a time, for at least the first 3-5 days, and then as needed for pain and swelling.  Continue to use ice for pain and swelling. You may notice swelling that will progress down to the foot and ankle.  This is normal after surgery.  Elevate your leg when you are not up walking on it.   Continue to use the breathing machine you got in the hospital (incentive spirometer) which will help keep your temperature down.  It is common for your temperature to cycle up and down following surgery, especially at night when you are not up moving around and exerting yourself.  The breathing machine keeps your lungs expanded and your temperature down.   DIET:  As you were doing prior to hospitalization, we recommend a well-balanced diet.  DRESSING / WOUND CARE / SHOWERING  You may shower 3 days after surgery, but keep the wounds dry during showering.  You may use an occlusive plastic wrap (Press'n Seal for example), NO  SOAKING/SUBMERGING IN THE BATHTUB.  If the bandage gets wet, change with a clean dry gauze.  If the incision gets wet, pat the wound dry with a clean towel.  ACTIVITY  Increase activity slowly as tolerated, but follow the weight bearing instructions below.   No driving for 6 weeks or until further direction given by your physician.  You cannot drive while taking narcotics.  No lifting or carrying greater than 10 lbs. until further directed by your surgeon. Avoid periods of inactivity such as sitting longer than an hour when not asleep. This helps prevent blood clots.  You may return to work once you are authorized by your doctor.     WEIGHT BEARING   Weight bearing as tolerated with assist device (walker, cane, etc) as directed, use it as long as suggested by your surgeon or therapist, typically at least 4-6 weeks.   EXERCISES  Results after joint replacement surgery are often greatly improved when you follow the exercise, range of motion and muscle strengthening exercises prescribed by your doctor. Safety measures are also important to protect the joint from further injury. Any time any of these exercises cause you to have increased pain or swelling, decrease what you are doing until you are comfortable again and then slowly increase them. If you have problems or questions, call your caregiver or physical therapist for advice.   Rehabilitation is important following a joint replacement. After just a few days of immobilization, the muscles of the leg can become weakened and shrink (atrophy).  These exercises are designed to build up the tone and strength of the thigh and leg muscles and to improve motion. Often times heat used for twenty to thirty minutes before working out will loosen up your tissues and help with improving the range of motion but do not use heat for the first two weeks following surgery (sometimes heat can increase post-operative swelling).   These exercises can be done on a  training (exercise) mat, on the floor, on a table or on a bed. Use whatever works the best and is most comfortable for you.    Use music or television while you are exercising so that the exercises are a pleasant break in your day. This will make your life better with the exercises acting as a break in your routine that you can look forward to.   Perform all exercises about fifteen times, three times per day or as directed.  You should exercise both the operative leg and the other leg as well.   Exercises include:   Quad Sets - Tighten up the muscle on the front of the thigh (Quad) and hold for 5-10 seconds.   Straight Leg Raises - With your knee straight (if you were given a brace, keep it on), lift the leg to 60 degrees, hold for 3 seconds, and slowly lower the leg.  Perform this exercise against resistance later as your leg gets stronger.  Leg Slides: Lying on your back, slowly slide your foot toward your buttocks, bending your knee up off the floor (only go as far as is comfortable). Then slowly slide your foot back down until your leg is flat on the floor again.  Angel Wings: Lying on your back spread your legs to the side as far apart as you can without causing discomfort.  Hamstring Strength:  Lying on your back, push your heel against the floor with your leg straight by tightening up the muscles of your buttocks.  Repeat, but this time bend your knee to a comfortable angle, and push your heel against the floor.  You may put a pillow under the heel to make it more comfortable if necessary.   A rehabilitation program following joint replacement surgery can speed recovery and prevent re-injury in the future due to weakened muscles. Contact your doctor or a physical therapist for more information on knee rehabilitation.    CONSTIPATION  Constipation is defined medically as fewer than three stools per week and severe constipation as less than one stool per week.  Even if you have a regular bowel  pattern at home, your normal regimen is likely to be disrupted due to multiple reasons following surgery.  Combination of anesthesia, postoperative narcotics, change in appetite and fluid intake all can affect your bowels.   YOU MUST use at least one of the following options; they are listed in order of increasing strength to get the job done.  They are all available over the counter, and you may need to use some, POSSIBLY even all of these options:    Drink plenty of fluids (prune juice may be helpful) and high fiber foods Colace 100 mg by mouth twice a day  Senokot for constipation as directed and as needed Dulcolax (bisacodyl), take with full glass of water  Miralax (polyethylene glycol) once or twice a day as needed.  If you have tried all these things and are unable to have a bowel movement in the first 3-4 days after surgery call either your surgeon or your  primary doctor.    If you experience loose stools or diarrhea, hold the medications until you stool forms back up.  If your symptoms do not get better within 1 week or if they get worse, check with your doctor.  If you experience "the worst abdominal pain ever" or develop nausea or vomiting, please contact the office immediately for further recommendations for treatment.   ITCHING:  If you experience itching with your medications, try taking only a single pain pill, or even half a pain pill at a time.  You can also use Benadryl over the counter for itching or also to help with sleep.   TED HOSE STOCKINGS:  Use stockings on both legs until for at least 2 weeks or as directed by physician office. They may be removed at night for sleeping.  MEDICATIONS:  See your medication summary on the "After Visit Summary" that nursing will review with you.  You may have some home medications which will be placed on hold until you complete the course of blood thinner medication.  It is important for you to complete the blood thinner medication as  prescribed.  PRECAUTIONS:  If you experience chest pain or shortness of breath - call 911 immediately for transfer to the hospital emergency department.   If you develop a fever greater that 101 F, purulent drainage from wound, increased redness or drainage from wound, foul odor from the wound/dressing, or calf pain - CONTACT YOUR SURGEON.                                                   FOLLOW-UP APPOINTMENTS:  If you do not already have a post-op appointment, please call the office for an appointment to be seen by your surgeon.  Guidelines for how soon to be seen are listed in your "After Visit Summary", but are typically between 1-4 weeks after surgery.  OTHER INSTRUCTIONS:   Knee Replacement:  Do not place pillow under knee, focus on keeping the knee straight while resting. CPM instructions: 0-90 degrees, 2 hours in the morning, 2 hours in the afternoon, and 2 hours in the evening. Place foam block, curve side up under heel at all times except when in CPM or when walking.  DO NOT modify, tear, cut, or change the foam block in any way.  MAKE SURE YOU:  Understand these instructions.  Get help right away if you are not doing well or get worse.    Thank you for letting us be a part of your medical care team.  It is a privilege we respect greatly.  We hope these instructions will help you stay on track for a fast and full recovery!     Increase activity slowly as tolerated    Complete by:  As directed            Follow-up Information    Follow up with Hessie Dibble, MD. Schedule an appointment as soon as possible for a visit in 2 weeks.   Specialty:  Orthopedic Surgery   Contact information:   [place] [address] [phone]        Signed: Rich Fuchs 06/03/2014, 9:42 AM

## 2014-06-03 NOTE — Progress Notes (Signed)
Subjective: 6 Days Post-Op Procedure(s) (LRB): TOTAL HIP ARTHROPLASTY ANTERIOR APPROACH (Right) HARDWARE REMOVAL (Right)   Patient resting comfortably in chair with daughter. She had multiple bowel movements since yesterday and feels much better. She is scheduled for a venous doppler today to rule out DVT since she has some right calf pain.  Activity level:  wbat Diet tolerance:  ok Voiding:  Foley in place Patient reports pain as mild.    Objective: Vital signs in last 24 hours: Temp:  [98.1 F (36.7 C)-98.6 F (37 C)] 98.2 F (36.8 C) (05/23 0536) Pulse Rate:  [80-88] 88 (05/23 0536) Resp:  [18] 18 (05/23 0536) BP: (131-159)/(58-69) 131/59 mmHg (05/23 0536) SpO2:  [92 %-98 %] 96 % (05/23 0536)  Labs: No results for input(s): HGB in the last 72 hours. No results for input(s): WBC, RBC, HCT, PLT in the last 72 hours.  Recent Labs  06/02/14 0550 06/02/14 1805 06/03/14 0525  NA 129* 128* 129*  K 4.2  --  4.2  CL 93*  --  95*  CO2 30  --  28  BUN 6  --  <5*  CREATININE 0.58  --  0.48  GLUCOSE 122*  --  107*  CALCIUM 8.4*  --  8.3*   No results for input(s): LABPT, INR in the last 72 hours.  Physical Exam:  Neurologically intact ABD soft Neurovascular intact Sensation intact distally Intact pulses distally Dorsiflexion/Plantar flexion intact Incision: dressing C/D/I and scant drainage No cellulitis present Compartment soft  Assessment/Plan:  6 Days Post-Op Procedure(s) (LRB): TOTAL HIP ARTHROPLASTY ANTERIOR APPROACH (Right) HARDWARE REMOVAL (Right) Advance diet Up with therapy [facility]charge to [facility] if cleared by medicine and doppler comes back negative. We will let medicine decide on if she goes home with foley in or not. We greatly appreciate medicines input and management.  Dressing changed today. Continue on ASA 325mg  BID x 4 weeks post op. Follow up in office 2 weeks post op.    Suann Klier, Larwance Sachs 06/03/2014, 9:36 AM

## 2014-06-03 NOTE — Consult Note (Signed)
[facility] Medical Consultation Progress Note  NABA SNEED MPN:[id number] DOB: 07/09/1920 DOA: 05/28/2014 PCP: Estill Dooms, MD   Requesting physician:  Loni Dolly Date of consultation:  05/30/2014 Reason for consultation: hyponatremia  Subjective - doing well - had BMs yesterday  Impression/Recommendations   - Can be discharged today once doppler LE is done   Recommendations for D/C - I&O cath 4 times daily, patient needs assistance with this. If she voids on her own with low post residuals, this can be spaced out. If at SNF they are unable to assist with cath 4 times daily, maintain Foley - continue Fluid restriction 1200 cc / day free water - repeat BMP in 3-4 days  - if doppler positive will need Xarelto - discussed with Loni Dolly, PA  Hyponatremia likely due to acute urinary retention, newly started venlafaxine, dehydration and possible underlying SIADH - She has low serum osmolality and low urine osmolality, her U osm is << S osm suggesting excess free water.  - sodium stabilized. Continue fluid restriction - TSH is borderline abnormal at 5.3, unlikely to have an impact to her Na levels, this needs to be repeated in 3-4 weeks as an outpatient - cortisol normal  - D/c venlafaxine and start again in a few weeks as outpatient with close lab supervision by PCP - I discussed with Dr. Louis Meckel this morning, he will try and schedule patient to be seen sooner, recommending I&O cath 4 times daily if SNF able to accommodate that. Discussed with SW to inquire prior to d/c. Alternative is d/c with Foley.  Right leg swelling - new - obtain venous doppler, if positive will need Xarelto on d/c  Arthritis status post right total hip arthroplasty - Management per orthopedics   Depression - She restarted venlafaxine 1 week ago - D/c venlafaxine and consider restarting as outpatient after she has recovered more since this may be worsening her  hyponatremia  Leukocytosis, mild and likely related to recent surgery - No signs of underlying infection at this time - WBC improved  Constipation - resolved, continue bowel regimen  Acute post-operative blood loss anemia s/p 2 unit blood transfusion with appropriate response to hemoglobin  Rest of chronic medical problems appear stable.  Will sign off, please call with Qs.   Physical Exam: Blood pressure 131/59, pulse 88, temperature 98.2 F (36.8 C), temperature source Oral, resp. rate 18, height 5' (1.524 m), weight 59.875 kg (132 lb), SpO2 96 %. Filed Vitals:   06/02/14 0437 06/02/14 1410 06/02/14 2012 06/03/14 0536  BP: 145/53 148/69 159/58 131/59  Pulse: 71 82 80 88  Temp: 98.1 F (36.7 C) 98.6 F (37 C) 98.1 F (36.7 C) 98.2 F (36.8 C)  TempSrc: Oral Oral Oral Oral  Resp: 18 18 18 18   Height:      Weight:      SpO2: 96% 98% 92% 96%    General:  NAD, bruise and swelling left side of her face  Eyes:  anicteric  Cardiovascular:  RRR, normal S1, S2, without m/r/g.  2+ pulses, warm extremities, trace edema  Respiratory:  CTA bilaterally without increased WOB.  Skin:  No rashes.   Musculoskeletal:  Normal bulk and tone.  No LE edema.  Psychiatric:  A & O x 4.  Appropriate affect.  Neurologic:  Non focal     Labs on Admission:  Basic Metabolic Panel:  Recent Labs Lab 05/29/14 2025  05/31/14 0028  06/01/14 0424 06/01/14 0086 06/01/14 1842 06/02/14 7619  06/02/14 1805 06/03/14 0525  NA 120*  < > 127*  127*  < > 131* 129* 127* 129* 128* 129*  K 4.5  --  4.4  --  4.3  --   --  4.2  --  4.2  CL 86*  --  94*  --  97*  --   --  93*  --  95*  CO2 26  --  27  --  30  --   --  30  --  28  GLUCOSE 149*  --  151*  --  118*  --   --  122*  --  107*  BUN 8  --  7  --  <5*  --   --  6  --  <5*  CREATININE 0.55  --  0.52  --  0.49  --   --  0.58  --  0.48  CALCIUM 8.0*  --  8.0*  --  8.2*  --   --  8.4*  --  8.3*  < > = values in this interval not  displayed.  CBC:  Recent Labs Lab 05/29/14 0500 05/30/14 0533 05/31/14 0028  WBC 8.8 11.6* 10.1  HGB 7.7* 10.1* 10.1*  HCT 23.1* 30.1* 30.1*  MCV 88.8 84.6 85.0  PLT 183 154 162   Time spent: 35 minutes, 50% bedside d/w daughter and patient and coordinating care with Urology, Primary team, Social Worker.  Marzetta Board [facility] Pager [phone]  If 7PM-7AM, please contact night-coverage [email or web address] Password [facility] 06/03/2014, 9:47 AM

## 2014-06-03 NOTE — Progress Notes (Signed)
Bilateral lower extremity venous duplex completed:  No evidence of DVT, superficial thrombosis, or Baker's cyst.   

## 2014-06-03 NOTE — Progress Notes (Signed)
Physical Therapy Treatment Patient Details Name: Cassandra Glenn MRN: [id number] DOB: [birth year] Today's Date: 06/03/2014    History of Present Illness Pt is a 81 y/o F s/p R THA, direct ant approach.  Pt's PMH includes urinary frequency, hyperflycemia, recurring UTI, compression fx of thoracic vertebra, breast cancer, macular degeneration, HOH, spinal stenosis w/ nerogenic claudication, trigger finger, R BBB, edema, lumbago, osteoporosis, hypotension, and SOB.    PT Comments    Patient is making great progress with mobility. Super sweet and motivated. Reviewed HEP and safety techniques with mobility. Patient and daughter asking some questions which I deferred back to Dr. Rhona Raider for followup. Continue to recommend SNF for ongoing Physical Therapy.     Follow Up Recommendations  SNF;Supervision/Assistance - 24 hour     Equipment Recommendations  Rolling walker with 5" wheels    Recommendations for Other Services       Precautions / Restrictions Precautions Precautions: Fall Precaution Comments: direct ant approach, no hip precautions.  Pt w/ episode of sycope on 05/28/14 Restrictions RLE Weight Bearing: Weight bearing as tolerated    Mobility  Bed Mobility               General bed mobility comments: Patient up in recliner before and after session  Transfers Overall transfer level: Needs assistance Equipment used: Rolling walker (2 wheeled)   Sit to Stand: Min guard         General transfer comment: verbal cues for sequencing and hand placement  Ambulation/Gait Ambulation/Gait assistance: Min guard Ambulation Distance (Feet): 90 Feet Assistive device: Rolling walker (2 wheeled) Gait Pattern/deviations: Step-through pattern;Decreased stride length Gait velocity: decreased and guarded Gait velocity interpretation: Below normal speed for age/gender General Gait Details: working on step through pattern this session   Network engineer Rankin (Stroke Patients Only)       Balance                                    Cognition Arousal/Alertness: Awake/alert Behavior During Therapy: WFL for tasks assessed/performed Overall Cognitive Status: Within Functional Limits for tasks assessed                      Exercises Total Joint Exercises Quad Sets: AROM;10 reps;Right Heel Slides: AAROM;10 reps;Right Hip ABduction/ADduction: AAROM;Right;10 reps Long Arc Quad: AROM;Right;10 reps    General Comments        Pertinent Vitals/Pain Pain Score: 2  Pain Location: R hip Pain Descriptors / Indicators: Aching;Sore Pain Intervention(s): Limited activity within patient's tolerance    Home Living                      Prior Function            PT Goals (current goals can now be found in the care plan section) Progress towards PT goals: Progressing toward goals    Frequency  7X/week    PT Plan Current plan remains appropriate    Co-evaluation             End of Session Equipment Utilized During Treatment: Gait belt Activity Tolerance: Patient tolerated treatment well Patient left: in chair;with family/visitor present;with call bell/phone within reach     Time: 7169-6789 PT Time Calculation (min) (ACUTE ONLY): 39 min  Charges:  $Gait Training: 23-37 mins $  Therapeutic Exercise: 8-22 mins                    G Codes:      Jacqualyn Posey 06/03/2014, 12:50 PM 06/03/2014 Jacqualyn Posey PTA [phone] pager [phone] office

## 2014-06-04 ENCOUNTER — Non-Acute Institutional Stay (SKILLED_NURSING_FACILITY): Payer: Medicare Other | Admitting: Nurse Practitioner

## 2014-06-04 ENCOUNTER — Encounter: Payer: Self-pay | Admitting: Nurse Practitioner

## 2014-06-04 DIAGNOSIS — E871 Hypo-osmolality and hyponatremia: Secondary | ICD-10-CM

## 2014-06-04 DIAGNOSIS — K5900 Constipation, unspecified: Secondary | ICD-10-CM | POA: Diagnosis not present

## 2014-06-04 DIAGNOSIS — R350 Frequency of micturition: Secondary | ICD-10-CM | POA: Diagnosis not present

## 2014-06-04 DIAGNOSIS — M1611 Unilateral primary osteoarthritis, right hip: Secondary | ICD-10-CM | POA: Diagnosis not present

## 2014-06-04 DIAGNOSIS — D62 Acute posthemorrhagic anemia: Secondary | ICD-10-CM

## 2014-06-04 DIAGNOSIS — G4700 Insomnia, unspecified: Secondary | ICD-10-CM

## 2014-06-04 NOTE — Assessment & Plan Note (Signed)
Will update CMP and TSH

## 2014-06-04 NOTE — Assessment & Plan Note (Signed)
Stable, takes Myrbetriq 25mg  daily and Nitrofurantoin 50mg  for UTI suppression therapy.

## 2014-06-04 NOTE — Assessment & Plan Note (Addendum)
Stable, takes Fibercon and MiraLax

## 2014-06-04 NOTE — Assessment & Plan Note (Signed)
Prn Ambien aids to sleep at night.

## 2014-06-04 NOTE — Assessment & Plan Note (Addendum)
TOTAL HIP ARTHROPLASTY ANTERIOR APPROACH HARDWARE REMOVAL on 05/28/2014 Here for rehab, taking ASA 325mg  bid

## 2014-06-04 NOTE — Assessment & Plan Note (Signed)
05/31/14 Hgb 10.1-repeat CBC

## 2014-06-04 NOTE — Progress Notes (Signed)
Patient ID: Cassandra Glenn, female   DOB: Sep 23, 1920, 81 y.o.   MRN: [id number]   Code Status: DNR  Allergies  Allergen Reactions  . Macrodantin [Nitrofurantoin Macrocrystal] Other (See Comments)    Unknown allergic reaction many years ago/ tolerating 50 mg macrodantin   . Septra [Sulfamethoxazole-Trimethoprim] Other (See Comments)    Unknown allergic reaction many years ago    Chief Complaint  Patient presents with  . Medical Management of Chronic Issues  . Hospitalization Follow-up    HPI: Patient is a 81 y.o. female seen in the SNF at [facility] today for evaluation of right hip pain, s/p right hip surgery  and other chronic medical conditions.   Hospitalized 05/28/2014-06/03/2014 for Primary osteoarthritis of right hip Hyponatremia Postoperative anemia due to acute blood loss  TOTAL HIP ARTHROPLASTY ANTERIOR APPROACH HARDWARE REMOVAL on 05/28/2014   Problem List Items Addressed This Visit    Insomnia (Chronic)    Prn Ambien aids to sleep at night.       Hyponatremia - Primary (Chronic)    Will update CMP and TSH      Primary osteoarthritis of right hip (Chronic)    TOTAL HIP ARTHROPLASTY ANTERIOR APPROACH HARDWARE REMOVAL on 05/28/2014 Here for rehab, taking ASA 325mg  bid      Urinary frequency    Stable, takes Myrbetriq 25mg  daily and Nitrofurantoin 50mg  for UTI suppression therapy.       Postoperative anemia due to acute blood loss    05/31/14 Hgb 10.1-repeat CBC       Constipation    Stable, takes Fibercon and MiraLax         Review of Systems:  Review of Systems  Constitutional: Negative for fever, activity change, fatigue and unexpected weight change.  HENT: Positive for hearing loss. Negative for ear pain.   Eyes:       History of loss of visual acuity and macular degeneration.  Respiratory:       History of breast discomfort. History of removal of a lump of cancer from the right breast. Dyspnea on exertion.  Cardiovascular: Negative for chest  pain, palpitations and leg swelling.  Gastrointestinal:       Chronic sluggish stools and straining. She frequently complains that her intestines are not working right. There is a previous history of diverticulosis. Her last colonoscopy was in 2008. She has been seen by Dr. Paulita Fujita in the past. Complains of bloated feeling.  Genitourinary:       Saw Dr. Louis Meckel, urologist. She is being taught self-catheterization for a bladder that does not empty well-presently she has Foley Cath  Vaginal discomfort. Managed by Dr. Claudean Kinds. History of elevated CEA 125. Normal ultrasound of the abdomen and genitourinary tract.  Musculoskeletal: Positive for joint swelling, arthralgias and gait problem.       Chronic back pain. Pain down the right leg. Some restriction of joint motion of the right hip.. She feels that there is something loose in the right hip area.  Patient has a shorter right leg and has a lift in the right shoe. Now seeing Dr. Rhona Raider. There is generalized stiffness in the joints. There is a history of spinal stenosis. Left third finger catches on extension sometimes. There is a trigger finger. Pain at the right hip with ROM and weight bearing since the surgery.   Skin:       History of skin discoloration and changes of nails. Previous history of folliculitis of the left lower leg.  Left facial bruise  R hip surgical incision intact, mild edematous RLE, no s/s of infection peri surgical wound.   Allergic/Immunologic: Negative.   Neurological: Negative for dizziness, tremors, seizures and numbness.       History of spinal stenosis with neurogenic claudication affecting the right leg. Complains of pain in the buttocks that is suggestive of neuralgia from her spinal stenosis.  Hematological: Bruises/bleeds easily.  Psychiatric/Behavioral: Negative.  Negative for hallucinations, behavioral problems, confusion, sleep disturbance, dysphoric mood, decreased concentration and agitation. The patient is  not nervous/anxious and is not hyperactive.      Past Medical History  Diagnosis Date  . Macular degeneration   . HOH (hard of hearing)   . Vaginitis and vulvovaginitis 02/29/2012  . Abnormalities of the hair 02/29/2012  . Spinal stenosis, lumbar region, with neurogenic claudication 11/02/2011  . Trigger finger (acquired) 11/02/2011  . Right bundle branch block 08/30/2011  . Edema 05/07/2011  . Contact dermatitis and other eczema, due to unspecified cause 05/07/2011  . Hypopotassemia 03/26/2011  . Candidiasis of skin and nails 03/19/2011  . Hyposmolality and/or hyponatremia 03/19/2011  . Diverticulosis of colon (without mention of hemorrhage) 03/19/2011  . Unspecified constipation 03/19/2011  . Pain in joint, pelvic region and thigh 03/19/2011  . Lumbago 03/19/2011  . Spasm of muscle 03/19/2011  . Osteoporosis, unspecified 03/19/2011  . Insomnia, unspecified 03/19/2011  . Other malaise and fatigue 03/19/2011  . Impaired fasting glucose 03/19/2011  . Hypotension, unspecified 03/18/2011  . Closed fracture of base of neck of femur 03/18/2011  . Arthritis   . Shortness of breath   . Kidney infection   . Cancer   . Malignant neoplasm of breast (female), unspecified site 10/28/2002   Past Surgical History  Procedure Laterality Date  . Cataract extraction  2010    bialteral  . Hip pinning,cannulated  03/08/2011    Procedure: CANNULATED HIP PINNING;  Surgeon: Johnn Hai, MD;  Location: [facility];  Service: Orthopedics;  Laterality: Right;  . Eye surgery      cataract  . Fracture surgery Left 09/1980    ankle  . Breast surgery Bilateral 2005-10/27/2004    lumpectomy- Streck,MD  . Squamous cell carcinoma excision Right 02/04/2011    forearm- Taffeen, MD  . Orif hip fracture Right 03/08/2011    Bean,MD  . Skin lesion excision Right 02/04/2011    Abdomen lesion spongiotic dermatitis-Taffeen, MD  . Skin cancer excision Left 10/12/2012    lower leg Dr. [place]  . Tonsillectomy     . Total hip arthroplasty Right 05/28/2014    Procedure: TOTAL HIP ARTHROPLASTY ANTERIOR APPROACH;  Surgeon: Melrose Nakayama, MD;  Location: [place];  Service: Orthopedics;  Laterality: Right;  . Hardware removal Right 05/28/2014    Procedure: HARDWARE REMOVAL;  Surgeon: Melrose Nakayama, MD;  Location: [place];  Service: Orthopedics;  Laterality: Right;   Social History:   reports that she has never smoked. She has never used smokeless tobacco. She reports that she drinks alcohol. She reports that she does not use illicit drugs.  Family History  Problem Relation Age of Onset  . Heart disease Mother   . Cancer Father   . Emphysema Brother   . Alzheimer's disease Brother     Medications: Patient's Medications  New Prescriptions   No medications on file  Previous Medications   ACETAMINOPHEN (TYLENOL) 500 MG TABLET    Take 1,000 mg by mouth every 6 (six) hours as needed for moderate pain.    ASPIRIN EC 325 MG EC  TABLET    Take 1 tablet (325 mg total) by mouth 2 (two) times daily after a meal.   BILBERRY 1000 MG CAPS    Take 1,000 mg by mouth daily.    CALCIUM CARBONATE-VITAMIN D (CALCIUM 600/VITAMIN D PO)    Take 600 mg by mouth 2 (two) times daily.    CALCIUM POLYCARBOPHIL (FIBERCON PO)    Take 1 tablet by mouth 2 (two) times daily.    CHOLECALCIFEROL (VITAMIN D) 400 UNITS TABS    Take 400 Units by mouth daily.    CRANBERRY EXTRACT PO    Take 1 tablet by mouth daily.   FISH OIL-OMEGA-3 FATTY ACIDS 1000 MG CAPSULE    Take 1 g by mouth daily.    GLUCOSAMINE-CHONDROITIN (GLUCOSAMINE CHONDR COMPLEX PO)    Take 1 tablet by mouth 2 (two) times daily. for joints   MIRABEGRON ER (MYRBETRIQ) 25 MG TB24 TABLET    Take 25 mg by mouth daily.   MULTIPLE VITAMINS-MINERALS (PRESERVISION/LUTEIN) CAPS    Take 1 capsule by mouth 2 (two) times daily.   NITROFURANTOIN (MACRODANTIN) 50 MG CAPSULE    Take 50 mg by mouth at bedtime. Prophylaxis course (UTI's) 42 day course 04/26/14   PROBIOTIC PRODUCT (RESTORA)  CAPS    Take 1 capsule by mouth daily.   TIZANIDINE (ZANAFLEX) 2 MG TABLET    Take 1 tablet (2 mg total) by mouth every 8 (eight) hours as needed for muscle spasms.   TRAMADOL (ULTRAM) 50 MG TABLET    Take 2 tablets (100 mg total) by mouth every 6 (six) hours as needed for moderate pain or severe pain.   ZOLPIDEM (AMBIEN) 5 MG TABLET    1/2 by mouth daily at bedtime as needed for rest  Modified Medications   No medications on file  Discontinued Medications   No medications on file     Physical Exam: Physical Exam  Constitutional: She is oriented to person, place, and time. She appears well-developed and well-nourished. No distress.  HENT:  HOH  Eyes:  Corrective lenses.  Neck: No JVD present. No tracheal deviation present. No thyromegaly present.  Cardiovascular: Normal rate, regular rhythm, normal heart sounds and intact distal pulses.  Exam reveals no gallop and no friction rub.   No murmur heard. Pulmonary/Chest: Effort normal and breath sounds normal. She has no rales.  Abdominal: Soft. Bowel sounds are normal. She exhibits no distension and no mass. There is no tenderness.  Musculoskeletal: Normal range of motion. She exhibits no edema or tenderness.  Poor balance; uses a cane and walker.  Lymphadenopathy:    She has no cervical adenopathy.  Neurological: She is alert and oriented to person, place, and time. No cranial nerve deficit.  Skin: Skin is warm and dry. There is pallor.  Left facial bruise R hip surgical incision is intact, mild inflammation peri surgical wound RLE edema 1+  Psychiatric: She has a normal mood and affect. Her behavior is normal. Thought content normal.   Filed Vitals:   06/04/14 1524  BP: 136/60  Pulse: 86  Temp: 98.3 F (36.8 C)  TempSrc: Tympanic  Resp: 16      Labs reviewed: Basic Metabolic Panel:  Recent Labs  05/30/14 1041  06/01/14 0424  06/02/14 0550 06/02/14 1805 06/03/14 0525  NA 123*  < > 131*  < > 129* 128* 129*  K  --    < > 4.3  --  4.2  --  4.2  CL  --   < >  97*  --  93*  --  95*  CO2  --   < > 30  --  30  --  28  GLUCOSE  --   < > 118*  --  122*  --  107*  BUN  --   < > <5*  --  6  --  <5*  CREATININE  --   < > 0.49  --  0.58  --  0.48  CALCIUM  --   < > 8.2*  --  8.4*  --  8.3*  TSH 5.349*  --   --   --   --   --   --   < > = values in this interval not displayed. Liver Function Tests:  Recent Labs  10/16/13 1143 11/19/13  AST 21 20  ALT 15 16  ALKPHOS 93 97  BILITOT 0.4  --   PROT 6.6  --   ALBUMIN 2.9*  --     Recent Labs  10/16/13 1143  LIPASE 16   No results for input(s): AMMONIA in the last 8760 hours. CBC:  Recent Labs  10/16/13 1143  05/17/14 1332 05/22/14 1304 05/29/14 0500 05/30/14 0533 05/31/14 0028  WBC 11.6*  < > 9.1 8.3 8.8 11.6* 10.1  NEUTROABS 8.1*  --  5.1 6.2  --   --   --   HGB 10.3*  < > 12.9 12.8 7.7* 10.1* 10.1*  HCT 31.3*  < > 39.8 39.5 23.1* 30.1* 30.1*  MCV 85.5  --  90.7 90.0 88.8 84.6 85.0  PLT 295  < > 276 236 183 154 162  < > = values in this interval not displayed. Lipid Panel: No results for input(s): CHOL, HDL, LDLCALC, TRIG, CHOLHDL, LDLDIRECT in the last 8760 hours.  Past Procedures: 05/22/14 CT cervical spine and head c/o contrast:  IMPRESSION: Mild left frontal scalp hematoma. Mild diffuse cortical atrophy. Mild chronic ischemic white matter disease. No acute intracranial abnormality seen.  Multilevel degenerative disc disease. No acute abnormality seen in the cervical spine.  06/03/14 Venous Doppler BLE: negative for DVT  Assessment/Plan Primary osteoarthritis of right hip TOTAL HIP ARTHROPLASTY ANTERIOR APPROACH HARDWARE REMOVAL on 05/28/2014 Here for rehab, taking ASA 325mg  bid   Urinary frequency Stable, takes Myrbetriq 25mg  daily and Nitrofurantoin 50mg  for UTI suppression therapy.    Constipation Stable, takes Fibercon and MiraLax   Postoperative anemia due to acute blood loss 05/31/14 Hgb 10.1-repeat CBC     Insomnia Prn Ambien aids to sleep at night.    Hyponatremia Will update CMP and TSH     Family/ Staff Communication: observe the patient.   Goals of Care: IL  Labs/tests ordered: CBC, CMP, TSH

## 2014-06-06 LAB — BASIC METABOLIC PANEL
BUN: 10 mg/dL (ref 4–21)
Creatinine: 0.6 mg/dL (ref 0.5–1.1)
POTASSIUM: 4.7 mmol/L (ref 3.4–5.3)
Sodium: 134 mmol/L — AB (ref 137–147)

## 2014-06-07 ENCOUNTER — Other Ambulatory Visit: Payer: Self-pay | Admitting: Nurse Practitioner

## 2014-06-07 DIAGNOSIS — E871 Hypo-osmolality and hyponatremia: Secondary | ICD-10-CM

## 2014-06-07 DIAGNOSIS — Z96641 Presence of right artificial hip joint: Secondary | ICD-10-CM | POA: Diagnosis not present

## 2014-06-07 NOTE — Assessment & Plan Note (Signed)
06/06/14 Na 134-repeat BMP one week.

## 2014-06-13 ENCOUNTER — Encounter: Payer: Self-pay | Admitting: Family

## 2014-06-13 ENCOUNTER — Non-Acute Institutional Stay (SKILLED_NURSING_FACILITY): Payer: Medicare Other | Admitting: Family

## 2014-06-13 DIAGNOSIS — M1611 Unilateral primary osteoarthritis, right hip: Secondary | ICD-10-CM | POA: Diagnosis not present

## 2014-06-13 DIAGNOSIS — D62 Acute posthemorrhagic anemia: Secondary | ICD-10-CM

## 2014-06-13 DIAGNOSIS — K5900 Constipation, unspecified: Secondary | ICD-10-CM

## 2014-06-13 DIAGNOSIS — G4700 Insomnia, unspecified: Secondary | ICD-10-CM

## 2014-06-13 DIAGNOSIS — E871 Hypo-osmolality and hyponatremia: Secondary | ICD-10-CM

## 2014-06-13 DIAGNOSIS — D72829 Elevated white blood cell count, unspecified: Secondary | ICD-10-CM | POA: Diagnosis not present

## 2014-06-13 DIAGNOSIS — R338 Other retention of urine: Secondary | ICD-10-CM

## 2014-06-13 LAB — BASIC METABOLIC PANEL
BUN: 10 mg/dL (ref 4–21)
CREATININE: 0.6 mg/dL (ref 0.5–1.1)
Glucose: 99 mg/dL
POTASSIUM: 4.5 mmol/L (ref 3.4–5.3)
SODIUM: 135 mmol/L — AB (ref 137–147)

## 2014-06-13 NOTE — Progress Notes (Addendum)
Patient ID: Cassandra Glenn, female   DOB: 11-Feb-1920, 81 y.o.   MRN: [id number]    HISTORY AND PHYSICAL  Location:  [facility] Room Number: 52 Place of Service: SNF (31)   Extended Emergency Contact Information Primary Emergency Contact: Black,Emily Address: [address]          [place], [place] [address] [place] Phone: [phone] Mobile Phone: [phone] Relation: Daughter  Advanced Directive information Does patient have an advance directive?: Yes, Type of Advance Directive: Healthcare Power of [place];Out of facility DNR (pink MOST or yellow form), Pre-existing out of facility DNR order (yellow form or pink MOST form): Yellow form placed in chart (order not valid for inpatient use)  Chief Complaint  Patient presents with  . New Admit To SNF    HPI:  Admitted to [facility] on 06/03/14 from [facility] following hospitalization 05/1714 -06/03/14 for right hip arthoplasty Admitted to SNF for PT and increased care needs. Currently able to completely dress with standby assistance. Ambulating steadily with walker and PT today upon evaluation. Using 100 mg of Tramadol once daily, reports adequate pain control. Does add this has caused some constipation. The constipation causes her mild abdominal discomfort. No complaints otherwise.   Prior to admission patient reports she was following with Urology and was self cath-ing once nightly. During hospitalization a foley was placed for acute urinary retention. Discharge recommendations were to cath 4 times daily if NH capable or d/c with foley. Foley remains. Follow up with urology scheduled for this coming week per pt report. Urine sent out for culture today, remains pending.    Past Medical History  Diagnosis Date  . Macular degeneration   . HOH (hard of hearing)   . Vaginitis and vulvovaginitis 02/29/2012  . Abnormalities of the hair 02/29/2012  . Spinal stenosis, lumbar region,  with neurogenic claudication 11/02/2011  . Trigger finger (acquired) 11/02/2011  . Right bundle branch block 08/30/2011  . Edema 05/07/2011  . Contact dermatitis and other eczema, due to unspecified cause 05/07/2011  . Hypopotassemia 03/26/2011  . Candidiasis of skin and nails 03/19/2011  . Hyposmolality and/or hyponatremia 03/19/2011  . Diverticulosis of colon (without mention of hemorrhage) 03/19/2011  . Unspecified constipation 03/19/2011  . Pain in joint, pelvic region and thigh 03/19/2011  . Lumbago 03/19/2011  . Spasm of muscle 03/19/2011  . Osteoporosis, unspecified 03/19/2011  . Insomnia, unspecified 03/19/2011  . Other malaise and fatigue 03/19/2011  . Impaired fasting glucose 03/19/2011  . Hypotension, unspecified 03/18/2011  . Closed fracture of base of neck of femur 03/18/2011  . Arthritis   . Shortness of breath   . Kidney infection   . Cancer   . Malignant neoplasm of breast (female), unspecified site 10/28/2002    Past Surgical History  Procedure Laterality Date  . Cataract extraction  2010    bialteral  . Hip pinning,cannulated  03/08/2011    Procedure: CANNULATED HIP PINNING;  Surgeon: Johnn Hai, MD;  Location: [facility];  Service: Orthopedics;  Laterality: Right;  . Eye surgery      cataract  . Fracture surgery Left 09/1980    ankle  . Breast surgery Bilateral 2005-10/27/2004    lumpectomy- Streck,MD  . Squamous cell carcinoma excision Right 02/04/2011    forearm- Taffeen, MD  . Orif hip fracture Right 03/08/2011    Bean,MD  . Skin lesion excision Right 02/04/2011    Abdomen lesion spongiotic dermatitis-Taffeen, MD  .  Skin cancer excision Left 10/12/2012    lower leg Dr. [place]  . Tonsillectomy    . Total hip arthroplasty Right 05/28/2014    Procedure: TOTAL HIP ARTHROPLASTY ANTERIOR APPROACH;  Surgeon: Melrose Nakayama, MD;  Location: [facility];  Service: Orthopedics;  Laterality: Right;  . Hardware removal Right 05/28/2014    Procedure: HARDWARE REMOVAL;   Surgeon: Melrose Nakayama, MD;  Location: [facility];  Service: Orthopedics;  Laterality: Right;    Patient Care Team: Estill Dooms, MD as PCP - General (Internal Medicine) [place] Man Mast X, NP as Nurse Practitioner (Nurse Practitioner) Shon Hough, MD as Consulting Physician (Ophthalmology) Molli Posey, MD as Consulting Physician (Obstetrics and Gynecology) Gerarda Fraction, MD as Consulting Physician (Ophthalmology) Lorelle Gibbs, MD as Consulting Physician (Radiology) Melrose Nakayama, MD as Consulting Physician (Orthopedic Surgery) Gerarda Fraction, MD as Referring Physician (Ophthalmology)  History   Social History  . Marital Status: Widowed    Spouse Name: N/A  . Number of Children: N/A  . Years of Education: N/A   Occupational History  . homemaker    Social History Main Topics  . Smoking status: Never Smoker   . Smokeless tobacco: Never Used  . Alcohol Use: 0.0 oz/week    0 Glasses of wine per week     Comment: occasional glass  . Drug Use: No  . Sexual Activity: No   Other Topics Concern  . Not on file   Social History Narrative   Lives at [facility]   Widowed   [place] none   POA/Living Will         reports that she has never smoked. She has never used smokeless tobacco. She reports that she drinks alcohol. She reports that she does not use illicit drugs.  Family History  Problem Relation Age of Onset  . Heart disease Mother   . Cancer Father   . Emphysema Brother   . Alzheimer's disease Brother    Family Status  Relation Status Death Age  . Mother Deceased 72    heart disease  . Father Deceased 26  . Brother Deceased   . Brother Deceased   . Daughter Alive   . Daughter Alive     Immunization History  Administered Date(s) Administered  . Influenza Whole 10/12/2011, 10/12/2012  . Influenza-Unspecified 10/25/2013  . PPD Test 07/07/2010  . Pneumococcal Conjugate-13 01/12/2003  . Zoster 01/12/2007     Allergies  Allergen Reactions  . Macrodantin [Nitrofurantoin Macrocrystal] Other (See Comments)    Unknown allergic reaction many years ago/ tolerating 50 mg macrodantin   . Septra [Sulfamethoxazole-Trimethoprim] Other (See Comments)    Unknown allergic reaction many years ago    Medications: Patient's Medications  New Prescriptions   No medications on file  Previous Medications   ACETAMINOPHEN (TYLENOL) 500 MG TABLET    Take 1,000 mg by mouth every 6 (six) hours as needed for moderate pain.    ASPIRIN EC 325 MG EC TABLET    Take 1 tablet (325 mg total) by mouth 2 (two) times daily after a meal.   BILBERRY 1000 MG CAPS    Take 1,000 mg by mouth daily.    CALCIUM CARBONATE-VITAMIN D (CALCIUM 600/VITAMIN D PO)    Take 600 mg by mouth 2 (two) times daily.    CALCIUM POLYCARBOPHIL (FIBERCON PO)    Take 1 tablet by mouth 2 (two) times daily.    CHOLECALCIFEROL (VITAMIN D) 400 UNITS TABS  Take 400 Units by mouth daily.    CRANBERRY EXTRACT PO    Take 1 tablet by mouth daily.   FISH OIL-OMEGA-3 FATTY ACIDS 1000 MG CAPSULE    Take 1 g by mouth daily.    GLUCOSAMINE-CHONDROITIN (GLUCOSAMINE CHONDR COMPLEX PO)    Take 1 tablet by mouth 2 (two) times daily. for joints   MIRABEGRON ER (MYRBETRIQ) 25 MG TB24 TABLET    Take 25 mg by mouth daily.   MULTIPLE VITAMINS-MINERALS (PRESERVISION/LUTEIN) CAPS    Take 1 capsule by mouth 2 (two) times daily.   NITROFURANTOIN (MACRODANTIN) 50 MG CAPSULE    Take 50 mg by mouth at bedtime. Prophylaxis course (UTI's) 42 day course 04/26/14   POLYETHYLENE GLYCOL (MIRALAX / GLYCOLAX) PACKET    Take 17 g by mouth daily.   PROBIOTIC PRODUCT (RESTORA) CAPS    Take 1 capsule by mouth daily.   TIZANIDINE (ZANAFLEX) 2 MG TABLET    Take 1 tablet (2 mg total) by mouth every 8 (eight) hours as needed for muscle spasms.   TRAMADOL (ULTRAM) 50 MG TABLET    Take 2 tablets (100 mg total) by mouth every 6 (six) hours as needed for moderate pain or severe pain.   ZOLPIDEM  (AMBIEN) 5 MG TABLET    1/2 by mouth daily at bedtime as needed for rest  Modified Medications   No medications on file  Discontinued Medications   No medications on file    Review of Systems  Constitutional: Negative for fever, activity change, fatigue and unexpected weight change.  HENT: Positive for hearing loss. Negative for ear pain.   Eyes:       History of loss of visual acuity and macular degeneration.  Respiratory:       History of breast discomfort. History of removal of a lump of cancer from the right breast. Dyspnea on exertion.  Cardiovascular: Positive for leg swelling. Negative for chest pain and palpitations.  Gastrointestinal:       Chronic sluggish stools and straining. She frequently complains that her intestines are not working right. There is a previous history of diverticulosis. Her last colonoscopy was in 2008. She has been seen by Dr. Paulita Fujita in the past. Constipation - she associates with Tramadol  Genitourinary:       Complains of a slow, weak urinary stream. Denies dysuria. Has increased frequency and nocturia x 4. Saw Dr. Louis Meckel, urologist. She is being taught self-catheterization for a bladder that does not empty well. Foley catheter in place following hospitalization.  Vaginal discomfort. Managed by Dr. Claudean Kinds. History of elevated CEA 125. Normal ultrasound of the abdomen and genitourinary tract.  Musculoskeletal:       Chronic back pain.  Patient has a shorter right leg and has a lift in the right shoe. Now seeing Dr. Rhona Raider.  Right hip arthroplasty 5/16 There is generalized stiffness in the joints. There is a history of spinal stenosis. Left third finger catches on extension sometimes. There is a trigger finger. Pain at the left iliac crest and left anterior ribs near the sternum.  Skin:       History of skin discoloration and changes of nails. Previous history of folliculitis of the left lower leg. Folliculitis has cleared. Denies itching or  rash. Hematoma left supraorbital area approximately 3-4 weeks ago. Now resolving with large ecchymosis remaining on the left cheek and working its way down the left neck.  Allergic/Immunologic: Negative.   Neurological: Negative for dizziness, tremors, seizures and numbness.  History of spinal stenosis with neurogenic claudication affecting the right leg. Complains of pain in the buttocks that is suggestive of neuralgia from her spinal stenosis.  Hematological: Bruises/bleeds easily.  Psychiatric/Behavioral: Negative.     Filed Vitals:   06/13/14 1240  BP: 129/67  Pulse: 74  Temp: 98.2 F (36.8 C)  Resp: 20  Height: 5' (1.524 m)  Weight: 139 lb 11.2 oz (63.368 kg)  SpO2: 94%   Body mass index is 27.28 kg/(m^2).  Physical Exam  Constitutional: She is oriented to person, place, and time. She appears well-developed and well-nourished. No distress.  HENT:  HOH  Eyes:  Corrective lenses.  Neck: No JVD present. No tracheal deviation present. No thyromegaly present.  Cardiovascular: Normal rate, regular rhythm and normal heart sounds.  Exam reveals no gallop and no friction rub.   No murmur heard. Pulses in RLE not palpable, has 2+ edema which could be obscuring pulses  Pulmonary/Chest: Effort normal and breath sounds normal. She has no rales.  Abdominal: Soft. Bowel sounds are normal. She exhibits no distension and no mass. There is no tenderness.  Musculoskeletal: Normal range of motion. She exhibits edema (RLE). She exhibits no tenderness.  Poor balance; using walker with PT following right hip replacement  Lymphadenopathy:    She has no cervical adenopathy.  Neurological: She is alert and oriented to person, place, and time. No cranial nerve deficit.  Skin: Skin is warm and dry. There is pallor.  Old ecchymosis to left face, following fall and blow to right orbit. Wound right hip is cleasn and not inflamed.  Psychiatric: She has a normal mood and affect. Her behavior is  normal. Thought content normal.     Labs reviewed: Lab on 06/07/2014  Component Date Value Ref Range Status  . BUN 06/06/2014 10  4 - 21 mg/dL Final  . Creatinine 06/06/2014 0.6  0.5 - 1.1 mg/dL Final  . Potassium 06/06/2014 4.7  3.4 - 5.3 mmol/L Final  . Sodium 06/06/2014 134* 137 - 147 mmol/L Final  Admission on 05/28/2014, Discharged on 06/03/2014  Component Date Value Ref Range Status  . WBC 05/29/2014 8.8  4.0 - 10.5 K/uL Final  . RBC 05/29/2014 2.60* 3.87 - 5.11 MIL/uL Final  . Hemoglobin 05/29/2014 7.7* 12.0 - 15.0 g/dL Final  . HCT 05/29/2014 23.1* 36.0 - 46.0 % Final  . MCV 05/29/2014 88.8  78.0 - 100.0 fL Final  . MCH 05/29/2014 29.6  26.0 - 34.0 pg Final  . MCHC 05/29/2014 33.3  30.0 - 36.0 g/dL Final  . RDW 05/29/2014 14.4  11.5 - 15.5 % Final  . Platelets 05/29/2014 183  150 - 400 K/uL Final  . Sodium 05/29/2014 125* 135 - 145 mmol/L Final  . Potassium 05/29/2014 4.2  3.5 - 5.1 mmol/L Final  . Chloride 05/29/2014 92* 101 - 111 mmol/L Final  . CO2 05/29/2014 27  22 - 32 mmol/L Final  . Glucose, Bld 05/29/2014 150* 65 - 99 mg/dL Final  . BUN 05/29/2014 10  6 - 20 mg/dL Final  . Creatinine, Ser 05/29/2014 0.71  0.44 - 1.00 mg/dL Final  . Calcium 05/29/2014 7.9* 8.9 - 10.3 mg/dL Final  . GFR calc non Af Amer 05/29/2014 >60  >60 mL/min Final  . GFR calc Af Amer 05/29/2014 >60  >60 mL/min Final   Comment: (NOTE) The eGFR has been calculated using the CKD EPI equation. This calculation has not been validated in all clinical situations. eGFR's persistently <60 mL/min signify possible Chronic  Kidney Disease.   . Anion gap 05/29/2014 6  5 - 15 Final  . Order Confirmation 05/29/2014 ORDER PROCESSED BY BLOOD BANK   Final  . Sodium 05/29/2014 120* 135 - 145 mmol/L Final  . Potassium 05/29/2014 4.5  3.5 - 5.1 mmol/L Final  . Chloride 05/29/2014 86* 101 - 111 mmol/L Final  . CO2 05/29/2014 26  22 - 32 mmol/L Final  . Glucose, Bld 05/29/2014 149* 65 - 99 mg/dL Final  . BUN  05/29/2014 8  6 - 20 mg/dL Final  . Creatinine, Ser 05/29/2014 0.55  0.44 - 1.00 mg/dL Final  . Calcium 05/29/2014 8.0* 8.9 - 10.3 mg/dL Final  . GFR calc non Af Amer 05/29/2014 >60  >60 mL/min Final  . GFR calc Af Amer 05/29/2014 >60  >60 mL/min Final   Comment: (NOTE) The eGFR has been calculated using the CKD EPI equation. This calculation has not been validated in all clinical situations. eGFR's persistently <60 mL/min signify possible Chronic Kidney Disease.   . Anion gap 05/29/2014 8  5 - 15 Final  . WBC 05/30/2014 11.6* 4.0 - 10.5 K/uL Final  . RBC 05/30/2014 3.56* 3.87 - 5.11 MIL/uL Final  . Hemoglobin 05/30/2014 10.1* 12.0 - 15.0 g/dL Final   POST TRANSFUSION SPECIMEN  . HCT 05/30/2014 30.1* 36.0 - 46.0 % Final  . MCV 05/30/2014 84.6  78.0 - 100.0 fL Final  . MCH 05/30/2014 28.4  26.0 - 34.0 pg Final  . MCHC 05/30/2014 33.6  30.0 - 36.0 g/dL Final  . RDW 05/30/2014 15.8* 11.5 - 15.5 % Final  . Platelets 05/30/2014 154  150 - 400 K/uL Final  . Osmolality 05/30/2014 258* 275 - 300 mOsm/kg Final   Performed at [facility]  . Osmolality, Ur 05/30/2014 166* 390 - 1090 mOsm/kg Final   Performed at [facility]  . Sodium, Ur 05/30/2014 <10   Final  . Color, Urine 05/30/2014 AMBER* YELLOW Final   BIOCHEMICALS MAY BE AFFECTED BY COLOR  . APPearance 05/30/2014 CLOUDY* CLEAR Final  . Specific Gravity, Urine 05/30/2014 1.007  1.005 - 1.030 Final  . pH 05/30/2014 6.5  5.0 - 8.0 Final  . Glucose, UA 05/30/2014 NEGATIVE  NEGATIVE mg/dL Final  . Hgb urine dipstick 05/30/2014 NEGATIVE  NEGATIVE Final  . Bilirubin Urine 05/30/2014 NEGATIVE  NEGATIVE Final  . Ketones, ur 05/30/2014 NEGATIVE  NEGATIVE mg/dL Final  . Protein, ur 05/30/2014 NEGATIVE  NEGATIVE mg/dL Final  . Urobilinogen, UA 05/30/2014 0.2  0.0 - 1.0 mg/dL Final  . Nitrite 05/30/2014 NEGATIVE  NEGATIVE Final  . Leukocytes, UA 05/30/2014 MODERATE* NEGATIVE Final  . TSH 05/30/2014 5.349* 0.350 - 4.500 uIU/mL  Final  . Cortisol, Plasma 05/30/2014 26.1   Final   Comment: (NOTE) AM    6.7 - 22.6 ug/dL PM   <10.0       ug/dL   . Sodium 05/30/2014 123* 135 - 145 mmol/L Final  . Sodium 05/30/2014 123* 135 - 145 mmol/L Final  . Sodium 05/30/2014 123* 135 - 145 mmol/L Final  . Squamous Epithelial / LPF 05/30/2014 RARE  RARE Final  . WBC, UA 05/30/2014 11-20  <3 WBC/hpf Final  . Bacteria, UA 05/30/2014 MANY* RARE Final  . Sodium 05/31/2014 127* 135 - 145 mmol/L Final  . WBC 05/31/2014 10.1  4.0 - 10.5 K/uL Final  . RBC 05/31/2014 3.54* 3.87 - 5.11 MIL/uL Final  . Hemoglobin 05/31/2014 10.1* 12.0 - 15.0 g/dL Final  . HCT 05/31/2014 30.1* 36.0 -  46.0 % Final  . MCV 05/31/2014 85.0  78.0 - 100.0 fL Final  . MCH 05/31/2014 28.5  26.0 - 34.0 pg Final  . MCHC 05/31/2014 33.6  30.0 - 36.0 g/dL Final  . RDW 05/31/2014 15.6* 11.5 - 15.5 % Final  . Platelets 05/31/2014 162  150 - 400 K/uL Final  . Sodium 05/31/2014 127* 135 - 145 mmol/L Final  . Potassium 05/31/2014 4.4  3.5 - 5.1 mmol/L Final  . Chloride 05/31/2014 94* 101 - 111 mmol/L Final  . CO2 05/31/2014 27  22 - 32 mmol/L Final  . Glucose, Bld 05/31/2014 151* 65 - 99 mg/dL Final  . BUN 05/31/2014 7  6 - 20 mg/dL Final  . Creatinine, Ser 05/31/2014 0.52  0.44 - 1.00 mg/dL Final  . Calcium 05/31/2014 8.0* 8.9 - 10.3 mg/dL Final  . GFR calc non Af Amer 05/31/2014 >60  >60 mL/min Final  . GFR calc Af Amer 05/31/2014 >60  >60 mL/min Final   Comment: (NOTE) The eGFR has been calculated using the CKD EPI equation. This calculation has not been validated in all clinical situations. eGFR's persistently <60 mL/min signify possible Chronic Kidney Disease.   . Anion gap 05/31/2014 6  5 - 15 Final  . Sodium 05/31/2014 128* 135 - 145 mmol/L Final  . Sodium 05/31/2014 128* 135 - 145 mmol/L Final  . Sodium 05/31/2014 128* 135 - 145 mmol/L Final  . Sodium 05/31/2014 127* 135 - 145 mmol/L Final  . Sodium 06/01/2014 130* 135 - 145 mmol/L Final  . Sodium  06/01/2014 131* 135 - 145 mmol/L Final  . Potassium 06/01/2014 4.3  3.5 - 5.1 mmol/L Final  . Chloride 06/01/2014 97* 101 - 111 mmol/L Final  . CO2 06/01/2014 30  22 - 32 mmol/L Final  . Glucose, Bld 06/01/2014 118* 65 - 99 mg/dL Final  . BUN 06/01/2014 <5* 6 - 20 mg/dL Final   REPEATED TO VERIFY  . Creatinine, Ser 06/01/2014 0.49  0.44 - 1.00 mg/dL Final  . Calcium 06/01/2014 8.2* 8.9 - 10.3 mg/dL Final  . GFR calc non Af Amer 06/01/2014 >60  >60 mL/min Final  . GFR calc Af Amer 06/01/2014 >60  >60 mL/min Final   Comment: (NOTE) The eGFR has been calculated using the CKD EPI equation. This calculation has not been validated in all clinical situations. eGFR's persistently <60 mL/min signify possible Chronic Kidney Disease.   . Anion gap 06/01/2014 4* 5 - 15 Final  . Sodium 06/01/2014 129* 135 - 145 mmol/L Final  . Sodium 06/01/2014 127* 135 - 145 mmol/L Final  . Sodium 06/02/2014 129* 135 - 145 mmol/L Final  . Potassium 06/02/2014 4.2  3.5 - 5.1 mmol/L Final  . Chloride 06/02/2014 93* 101 - 111 mmol/L Final  . CO2 06/02/2014 30  22 - 32 mmol/L Final  . Glucose, Bld 06/02/2014 122* 65 - 99 mg/dL Final  . BUN 06/02/2014 6  6 - 20 mg/dL Final  . Creatinine, Ser 06/02/2014 0.58  0.44 - 1.00 mg/dL Final  . Calcium 06/02/2014 8.4* 8.9 - 10.3 mg/dL Final  . GFR calc non Af Amer 06/02/2014 >60  >60 mL/min Final  . GFR calc Af Amer 06/02/2014 >60  >60 mL/min Final   Comment: (NOTE) The eGFR has been calculated using the CKD EPI equation. This calculation has not been validated in all clinical situations. eGFR's persistently <60 mL/min signify possible Chronic Kidney Disease.   . Anion gap 06/02/2014 6  5 - 15 Final  .  Sodium 06/02/2014 128* 135 - 145 mmol/L Final  . Sodium 06/03/2014 129* 135 - 145 mmol/L Final  . Potassium 06/03/2014 4.2  3.5 - 5.1 mmol/L Final  . Chloride 06/03/2014 95* 101 - 111 mmol/L Final  . CO2 06/03/2014 28  22 - 32 mmol/L Final  . Glucose, Bld 06/03/2014  107* 65 - 99 mg/dL Final  . BUN 06/03/2014 <5* 6 - 20 mg/dL Final  . Creatinine, Ser 06/03/2014 0.48  0.44 - 1.00 mg/dL Final  . Calcium 06/03/2014 8.3* 8.9 - 10.3 mg/dL Final  . GFR calc non Af Amer 06/03/2014 >60  >60 mL/min Final  . GFR calc Af Amer 06/03/2014 >60  >60 mL/min Final   Comment: (NOTE) The eGFR has been calculated using the CKD EPI equation. This calculation has not been validated in all clinical situations. eGFR's persistently <60 mL/min signify possible Chronic Kidney Disease.   . Anion gap 06/03/2014 6  5 - 15 Final  Admission on 05/22/2014, Discharged on 05/22/2014  Component Date Value Ref Range Status  . WBC 05/22/2014 8.3  4.0 - 10.5 K/uL Final  . RBC 05/22/2014 4.39  3.87 - 5.11 MIL/uL Final  . Hemoglobin 05/22/2014 12.8  12.0 - 15.0 g/dL Final  . HCT 05/22/2014 39.5  36.0 - 46.0 % Final  . MCV 05/22/2014 90.0  78.0 - 100.0 fL Final  . MCH 05/22/2014 29.2  26.0 - 34.0 pg Final  . MCHC 05/22/2014 32.4  30.0 - 36.0 g/dL Final  . RDW 05/22/2014 14.2  11.5 - 15.5 % Final  . Platelets 05/22/2014 236  150 - 400 K/uL Final  . Neutrophils Relative % 05/22/2014 74  43 - 77 % Final  . Neutro Abs 05/22/2014 6.2  1.7 - 7.7 K/uL Final  . Lymphocytes Relative 05/22/2014 16  12 - 46 % Final  . Lymphs Abs 05/22/2014 1.3  0.7 - 4.0 K/uL Final  . Monocytes Relative 05/22/2014 8  3 - 12 % Final  . Monocytes Absolute 05/22/2014 0.6  0.1 - 1.0 K/uL Final  . Eosinophils Relative 05/22/2014 2  0 - 5 % Final  . Eosinophils Absolute 05/22/2014 0.1  0.0 - 0.7 K/uL Final  . Basophils Relative 05/22/2014 0  0 - 1 % Final  . Basophils Absolute 05/22/2014 0.0  0.0 - 0.1 K/uL Final  . Sodium 05/22/2014 134* 135 - 145 mmol/L Final  . Potassium 05/22/2014 4.0  3.5 - 5.1 mmol/L Final  . Chloride 05/22/2014 99* 101 - 111 mmol/L Final  . CO2 05/22/2014 27  22 - 32 mmol/L Final  . Glucose, Bld 05/22/2014 124* 70 - 99 mg/dL Final  . BUN 05/22/2014 8  6 - 20 mg/dL Final  . Creatinine, Ser  05/22/2014 0.68  0.44 - 1.00 mg/dL Final  . Calcium 05/22/2014 9.7  8.9 - 10.3 mg/dL Final  . GFR calc non Af Amer 05/22/2014 >60  >60 mL/min Final  . GFR calc Af Amer 05/22/2014 >60  >60 mL/min Final   Comment: (NOTE) The eGFR has been calculated using the CKD EPI equation. This calculation has not been validated in all clinical situations. eGFR's persistently <60 mL/min signify possible Chronic Kidney Disease.   . Anion gap 05/22/2014 8  5 - 15 Final  . Troponin i, poc 05/22/2014 0.00  0.00 - 0.08 ng/mL Final  . Comment 3 05/22/2014          Final   Comment: Due to the release kinetics of cTnI, a negative result within the first  hours of the onset of symptoms does not rule out myocardial infarction with certainty. If myocardial infarction is still suspected, repeat the test at appropriate intervals.   . Color, Urine 05/22/2014 YELLOW  YELLOW Final  . APPearance 05/22/2014 CLEAR  CLEAR Final  . Specific Gravity, Urine 05/22/2014 1.009  1.005 - 1.030 Final  . pH 05/22/2014 7.5  5.0 - 8.0 Final  . Glucose, UA 05/22/2014 NEGATIVE  NEGATIVE mg/dL Final  . Hgb urine dipstick 05/22/2014 NEGATIVE  NEGATIVE Final  . Bilirubin Urine 05/22/2014 NEGATIVE  NEGATIVE Final  . Ketones, ur 05/22/2014 NEGATIVE  NEGATIVE mg/dL Final  . Protein, ur 05/22/2014 NEGATIVE  NEGATIVE mg/dL Final  . Urobilinogen, UA 05/22/2014 0.2  0.0 - 1.0 mg/dL Final  . Nitrite 05/22/2014 NEGATIVE  NEGATIVE Final  . Leukocytes, UA 05/22/2014 SMALL* NEGATIVE Final  . Squamous Epithelial / LPF 05/22/2014 RARE  RARE Final  . WBC, UA 05/22/2014 11-20  <3 WBC/hpf Final  . RBC / HPF 05/22/2014 0-2  <3 RBC/hpf Final  . Bacteria, UA 05/22/2014 FEW* RARE Final  . Urine-Other 05/22/2014 AMORPHOUS URATES/PHOSPHATES   Final  . Specimen Description 05/22/2014 URINE, RANDOM   Final  . Special Requests 05/22/2014 NONE   Final  . Colony Count 05/22/2014    Final                   Value:25,000 COLONIES/ML Performed at [facility]   . Culture 05/22/2014    Final                   Value:Multiple bacterial morphotypes present, none predominant. Suggest appropriate recollection if clinically indicated. Performed at [facility]   . Report Status 05/22/2014 05/23/2014 FINAL   Final  Hospital Outpatient Visit on 05/17/2014  Component Date Value Ref Range Status  . MRSA, PCR 05/17/2014 NEGATIVE  NEGATIVE Final  . Staphylococcus aureus 05/17/2014 NEGATIVE  NEGATIVE Final   Comment:        The Xpert SA Assay (FDA approved for NASAL specimens in patients over 16 years of age), is one component of a comprehensive surveillance program.  Test performance has been validated by [facility] for patients greater than or equal to 38 year old. It is not intended to diagnose infection nor to guide or monitor treatment.   Marland Kitchen aPTT 05/17/2014 28  24 - 37 seconds Final  . Sodium 05/17/2014 134* 135 - 145 mmol/L Final  . Potassium 05/17/2014 3.9  3.5 - 5.1 mmol/L Final  . Chloride 05/17/2014 96* 101 - 111 mmol/L Final  . CO2 05/17/2014 28  22 - 32 mmol/L Final  . Glucose, Bld 05/17/2014 111* 70 - 99 mg/dL Final  . BUN 05/17/2014 15  6 - 20 mg/dL Final  . Creatinine, Ser 05/17/2014 0.65  0.44 - 1.00 mg/dL Final  . Calcium 05/17/2014 10.2  8.9 - 10.3 mg/dL Final  . GFR calc non Af Amer 05/17/2014 >60  >60 mL/min Final  . GFR calc Af Amer 05/17/2014 >60  >60 mL/min Final   Comment: (NOTE) The eGFR has been calculated using the CKD EPI equation. This calculation has not been validated in all clinical situations. eGFR's persistently <60 mL/min signify possible Chronic Kidney Disease.   . Anion gap 05/17/2014 10  5 - 15 Final  . WBC 05/17/2014 9.1  4.0 - 10.5 K/uL Final  . RBC 05/17/2014 4.39  3.87 - 5.11 MIL/uL Final  . Hemoglobin 05/17/2014 12.9  12.0 - 15.0 g/dL Final  .  HCT 05/17/2014 39.8  36.0 - 46.0 % Final  . MCV 05/17/2014 90.7  78.0 - 100.0 fL Final  . MCH 05/17/2014 29.4  26.0 - 34.0 pg Final  .  MCHC 05/17/2014 32.4  30.0 - 36.0 g/dL Final  . RDW 05/17/2014 14.6  11.5 - 15.5 % Final  . Platelets 05/17/2014 276  150 - 400 K/uL Final  . Neutrophils Relative % 05/17/2014 56  43 - 77 % Final  . Neutro Abs 05/17/2014 5.1  1.7 - 7.7 K/uL Final  . Lymphocytes Relative 05/17/2014 31  12 - 46 % Final  . Lymphs Abs 05/17/2014 2.8  0.7 - 4.0 K/uL Final  . Monocytes Relative 05/17/2014 9  3 - 12 % Final  . Monocytes Absolute 05/17/2014 0.8  0.1 - 1.0 K/uL Final  . Eosinophils Relative 05/17/2014 4  0 - 5 % Final  . Eosinophils Absolute 05/17/2014 0.4  0.0 - 0.7 K/uL Final  . Basophils Relative 05/17/2014 0  0 - 1 % Final  . Basophils Absolute 05/17/2014 0.0  0.0 - 0.1 K/uL Final  . Prothrombin Time 05/17/2014 12.9  11.6 - 15.2 seconds Final  . INR 05/17/2014 0.96  0.00 - 1.49 Final  . ABO/RH(D) 05/17/2014 A POS   Final  . Antibody Screen 05/17/2014 NEG   Final  . Sample Expiration 05/17/2014 05/31/2014   Final  . Unit Number 05/17/2014 [id number]   Final  . Blood Component Type 05/17/2014 RED CELLS,LR   Final  . Unit division 05/17/2014 00   Final  . Status of Unit 05/17/2014 ISSUED,FINAL   Final  . Transfusion Status 05/17/2014 OK TO TRANSFUSE   Final  . Crossmatch Result 05/17/2014 Compatible   Final  . Unit Number 05/17/2014 [id number]   Final  . Blood Component Type 05/17/2014 RED CELLS,LR   Final  . Unit division 05/17/2014 00   Final  . Status of Unit 05/17/2014 ISSUED,FINAL   Final  . Transfusion Status 05/17/2014 OK TO TRANSFUSE   Final  . Crossmatch Result 05/17/2014 Compatible   Final  . Color, Urine 05/17/2014 YELLOW  YELLOW Final  . APPearance 05/17/2014 CLEAR  CLEAR Final  . Specific Gravity, Urine 05/17/2014 1.009  1.005 - 1.030 Final  . pH 05/17/2014 6.5  5.0 - 8.0 Final  . Glucose, UA 05/17/2014 NEGATIVE  NEGATIVE mg/dL Final  . Hgb urine dipstick 05/17/2014 NEGATIVE  NEGATIVE Final  . Bilirubin Urine 05/17/2014 NEGATIVE  NEGATIVE Final  . Ketones, ur  05/17/2014 NEGATIVE  NEGATIVE mg/dL Final  . Protein, ur 05/17/2014 NEGATIVE  NEGATIVE mg/dL Final  . Urobilinogen, UA 05/17/2014 0.2  0.0 - 1.0 mg/dL Final  . Nitrite 05/17/2014 NEGATIVE  NEGATIVE Final  . Leukocytes, UA 05/17/2014 MODERATE* NEGATIVE Final  . Squamous Epithelial / LPF 05/17/2014 FEW* RARE Final  . WBC, UA 05/17/2014 11-20  <3 WBC/hpf Final  . RBC / HPF 05/17/2014 0-2  <3 RBC/hpf Final  . Bacteria, UA 05/17/2014 FEW* RARE Final    Dg Chest 1 View  05/22/2014   CLINICAL DATA:  Recent fall with chest pain  EXAM: CHEST  1 VIEW  COMPARISON:  10/16/2013  FINDINGS: Cardiac shadow is within normal limits. The lungs are well aerated bilaterally. Some stable scarring is noted in the apices bilaterally. No focal infiltrate or sizable effusion is seen. Bony structures are within normal limits.  IMPRESSION: Chronic apical changes without acute abnormality.   Electronically Signed   By: Inez Catalina M.D.   On: 05/22/2014 14:07  Ct Head Wo Contrast  05/22/2014   CLINICAL DATA:  Scalp hematoma after dizziness and fall at nursing facility.  EXAM: CT HEAD WITHOUT CONTRAST  CT CERVICAL SPINE WITHOUT CONTRAST  TECHNIQUE: Multidetector CT imaging of the head and cervical spine was performed following the standard protocol without intravenous contrast. Multiplanar CT image reconstructions of the cervical spine were also generated.  COMPARISON:  CT scan of head of November 19, 2004.  FINDINGS: CT HEAD FINDINGS  Bony calvarium appears intact. Mild left frontal scalp hematoma is noted. Mild diffuse cortical atrophy is noted. Mild chronic ischemic white matter disease is noted. No mass effect or midline shift is noted. Ventricular size is within normal limits. There is no evidence of mass lesion, hemorrhage or acute infarction.  CT CERVICAL SPINE FINDINGS  No fracture is noted. Minimal grade 1 retrolisthesis of C3-4 is noted secondary to severe degenerative disc disease at this level. Severe degenerative disc  disease is also noted at C4-5 and C5-6. Moderate degenerative disc disease is noted at C6-7 with posterior osteophyte formation. Mild degenerative changes seen involving posterior facet joints of C7-T1. Biapical scarring is noted.  IMPRESSION: Mild left frontal scalp hematoma. Mild diffuse cortical atrophy. Mild chronic ischemic white matter disease. No acute intracranial abnormality seen.  Multilevel degenerative disc disease. No acute abnormality seen in the cervical spine.   Electronically Signed   By: Marijo Conception, M.D.   On: 05/22/2014 13:55   Ct Cervical Spine Wo Contrast  05/22/2014   CLINICAL DATA:  Scalp hematoma after dizziness and fall at nursing facility.  EXAM: CT HEAD WITHOUT CONTRAST  CT CERVICAL SPINE WITHOUT CONTRAST  TECHNIQUE: Multidetector CT imaging of the head and cervical spine was performed following the standard protocol without intravenous contrast. Multiplanar CT image reconstructions of the cervical spine were also generated.  COMPARISON:  CT scan of head of November 19, 2004.  FINDINGS: CT HEAD FINDINGS  Bony calvarium appears intact. Mild left frontal scalp hematoma is noted. Mild diffuse cortical atrophy is noted. Mild chronic ischemic white matter disease is noted. No mass effect or midline shift is noted. Ventricular size is within normal limits. There is no evidence of mass lesion, hemorrhage or acute infarction.  CT CERVICAL SPINE FINDINGS  No fracture is noted. Minimal grade 1 retrolisthesis of C3-4 is noted secondary to severe degenerative disc disease at this level. Severe degenerative disc disease is also noted at C4-5 and C5-6. Moderate degenerative disc disease is noted at C6-7 with posterior osteophyte formation. Mild degenerative changes seen involving posterior facet joints of C7-T1. Biapical scarring is noted.  IMPRESSION: Mild left frontal scalp hematoma. Mild diffuse cortical atrophy. Mild chronic ischemic white matter disease. No acute intracranial abnormality  seen.  Multilevel degenerative disc disease. No acute abnormality seen in the cervical spine.   Electronically Signed   By: Marijo Conception, M.D.   On: 05/22/2014 13:55   Dg Hip Operative Unilat With Pelvis Right  05/28/2014   CLINICAL DATA:  Hardware removal with right hip replacement  EXAM: OPERATIVE right HIP (WITH PELVIS IF PERFORMED) 5 VIEWS  TECHNIQUE: Fluoroscopic spot image(s) were submitted for interpretation post-operatively.  FLUOROSCOPY TIME:  Radiation Exposure Index (as provided by the fluoroscopic device): Not available  If the device does not provide the exposure index:  Fluoroscopy Time:  35 seconds  Number of Acquired Images:  5  COMPARISON:  None.  FINDINGS: Compression screws are noted traversing the right femoral neck. Considerable remodeling of the right femoral head  is noted. These screws for a subsequently removed and a total right hip replacement performed. No acute abnormality is noted.  IMPRESSION: Status post right hip replacement.   Electronically Signed   By: Inez Catalina M.D.   On: 05/28/2014 10:06     Assessment/Plan 1. Primary osteoarthritis of right hip and THA right hip Continue PT  2. Acute urinary retention Foley catheter in place Urology following, appointment next week  3. Constipation, unspecified constipation type Worsened by Tramadol Controlled with recent addition of [place] as well  4. Postoperative anemia due to acute blood loss HgB 10.8, Hct 32.5 on 06/11/14  5. Leukocytosis WBC 12 on 06/11/14 Urine culture pending  6. Insomnia Continue Ambien  7. Hyponatremia CMP next week

## 2014-06-14 ENCOUNTER — Other Ambulatory Visit: Payer: Self-pay | Admitting: Nurse Practitioner

## 2014-06-14 DIAGNOSIS — E871 Hypo-osmolality and hyponatremia: Secondary | ICD-10-CM

## 2014-06-17 ENCOUNTER — Other Ambulatory Visit: Payer: Self-pay | Admitting: *Deleted

## 2014-06-17 MED ORDER — TRAMADOL HCL 50 MG PO TABS: 100.0000 mg | ORAL_TABLET | Freq: Four times a day (QID) | ORAL | Status: DC | PRN

## 2014-06-17 NOTE — Telephone Encounter (Signed)
[facility] 

## 2014-06-18 ENCOUNTER — Encounter: Payer: Self-pay | Admitting: Nurse Practitioner

## 2014-06-18 ENCOUNTER — Non-Acute Institutional Stay (SKILLED_NURSING_FACILITY): Payer: Medicare Other | Admitting: Nurse Practitioner

## 2014-06-18 DIAGNOSIS — D62 Acute posthemorrhagic anemia: Secondary | ICD-10-CM | POA: Diagnosis not present

## 2014-06-18 DIAGNOSIS — R339 Retention of urine, unspecified: Secondary | ICD-10-CM | POA: Diagnosis not present

## 2014-06-18 DIAGNOSIS — R338 Other retention of urine: Secondary | ICD-10-CM

## 2014-06-18 DIAGNOSIS — M1611 Unilateral primary osteoarthritis, right hip: Secondary | ICD-10-CM

## 2014-06-18 DIAGNOSIS — E871 Hypo-osmolality and hyponatremia: Secondary | ICD-10-CM | POA: Diagnosis not present

## 2014-06-18 DIAGNOSIS — G4700 Insomnia, unspecified: Secondary | ICD-10-CM

## 2014-06-18 DIAGNOSIS — K5900 Constipation, unspecified: Secondary | ICD-10-CM | POA: Diagnosis not present

## 2014-06-18 DIAGNOSIS — N390 Urinary tract infection, site not specified: Secondary | ICD-10-CM

## 2014-06-18 NOTE — Progress Notes (Signed)
Patient ID: Cassandra Glenn, female   DOB: [birth year], 81 y.o.   MRN: [id number]   Code Status: DNR  Allergies  Allergen Reactions  . Macrodantin [Nitrofurantoin Macrocrystal] Other (See Comments)    Unknown allergic reaction many years ago/ tolerating 50 mg macrodantin   . Septra [Sulfamethoxazole-Trimethoprim] Other (See Comments)    Unknown allergic reaction many years ago    Chief Complaint  Patient presents with  . Medical Management of Chronic Issues  . Acute Visit    UTI    HPI: Patient is a 81 y.o. female seen in the SNF at [facility] today for evaluation of UTI and chronic medical conditions.   Hospitalized 05/28/2014-06/03/2014 for Primary osteoarthritis of right hip Hyponatremia Postoperative anemia due to acute blood loss  TOTAL HIP ARTHROPLASTY ANTERIOR APPROACH HARDWARE REMOVAL on 05/28/2014   Problem List Items Addressed This Visit    Insomnia - Primary (Chronic)    06/18/14 SLUMS 23/30(20-24 mild neurocognitive disorder) 06/18/14 Zolpidem 5mg  daily       Hyponatremia (Chronic)    06/13/14 Na 135       UTI (urinary tract infection) (Chronic)    04/16/13 pseudomonas aeruginosa: tx with Cipro 06/15/14 P. Aeruginosa Cassandra Glenn 500mg  IM q12h x 7 days.        Primary osteoarthritis of right hip (Chronic)    TOTAL HIP ARTHROPLASTY ANTERIOR APPROACH HARDWARE REMOVAL on 05/28/2014 Continue Rehab, taking ASA 325mg  bid       Postoperative anemia due to acute blood loss    05/31/14 Hgb 10.1      Acute urinary retention    F/u urology.       Constipation    Stable, takes Fibercon and MiraLax          Review of Systems:  Review of Systems  Constitutional: Negative for fever, activity change, fatigue and unexpected weight change.  HENT: Positive for hearing loss. Negative for ear pain.   Eyes:       History of loss of visual acuity and macular degeneration.  Respiratory:       History of breast discomfort. History of removal of a lump of cancer from the  right breast. Dyspnea on exertion.  Cardiovascular: Positive for leg swelling. Negative for chest pain and palpitations.       Right RLE  Gastrointestinal:       Chronic sluggish stools and straining. She frequently complains that her intestines are not working right. There is a previous history of diverticulosis. Her last colonoscopy was in 2008. She has been seen by Dr. Paulita Fujita in the past. Complains of bloated feeling.  Genitourinary:       Saw Dr. Louis Meckel, urologist. She is being taught self-catheterization for a bladder that does not empty well-presently she has Foley Cath  Vaginal discomfort. Managed by Dr. Claudean Kinds. History of elevated CEA 125. Normal ultrasound of the abdomen and genitourinary tract.  Musculoskeletal: Positive for joint swelling, arthralgias and gait problem.       Chronic back pain. Pain down the right leg. Some restriction of joint motion of the right hip.. She feels that there is something loose in the right hip area.  Patient has a shorter right leg and has a lift in the right shoe. Now seeing Dr. Rhona Raider. There is generalized stiffness in the joints. There is a history of spinal stenosis. Left third finger catches on extension sometimes. There is a trigger finger. Pain at the right hip with ROM and weight bearing since the surgery.  Skin:       History of skin discoloration and changes of nails. Previous history of folliculitis of the left lower leg.  Left facial bruise R hip surgical incision intact, mild edematous RLE, no s/s of infection peri surgical wound.   Allergic/Immunologic: Negative.   Neurological: Negative for dizziness, tremors, seizures and numbness.       History of spinal stenosis with neurogenic claudication affecting the right leg. Complains of pain in the buttocks that is suggestive of neuralgia from her spinal stenosis.  Hematological: Bruises/bleeds easily.  Psychiatric/Behavioral: Negative.  Negative for hallucinations, behavioral  problems, confusion, sleep disturbance, dysphoric mood, decreased concentration and agitation. The patient is not nervous/anxious and is not hyperactive.      Past Medical History  Diagnosis Date  . Macular degeneration   . HOH (hard of hearing)   . Vaginitis and vulvovaginitis 02/29/2012  . Abnormalities of the hair 02/29/2012  . Spinal stenosis, lumbar region, with neurogenic claudication 11/02/2011  . Trigger finger (acquired) 11/02/2011  . Right bundle branch block 08/30/2011  . Edema 05/07/2011  . Contact dermatitis and other eczema, due to unspecified cause 05/07/2011  . Hypopotassemia 03/26/2011  . Candidiasis of skin and nails 03/19/2011  . Hyposmolality and/or hyponatremia 03/19/2011  . Diverticulosis of colon (without mention of hemorrhage) 03/19/2011  . Unspecified constipation 03/19/2011  . Pain in joint, pelvic region and thigh 03/19/2011  . Lumbago 03/19/2011  . Spasm of muscle 03/19/2011  . Osteoporosis, unspecified 03/19/2011  . Insomnia, unspecified 03/19/2011  . Other malaise and fatigue 03/19/2011  . Impaired fasting glucose 03/19/2011  . Hypotension, unspecified 03/18/2011  . Closed fracture of base of neck of femur 03/18/2011  . Arthritis   . Shortness of breath   . Kidney infection   . Cancer   . Malignant neoplasm of breast (female), unspecified site 10/28/2002   Past Surgical History  Procedure Laterality Date  . Cataract extraction  2010    bialteral  . Hip pinning,cannulated  03/08/2011    Procedure: CANNULATED HIP PINNING;  Surgeon: Johnn Hai, MD;  Location: [facility];  Service: Orthopedics;  Laterality: Right;  . Eye surgery      cataract  . Fracture surgery Left 09/1980    ankle  . Breast surgery Bilateral 2005-10/27/2004    lumpectomy- Streck,MD  . Squamous cell carcinoma excision Right 02/04/2011    forearm- Taffeen, MD  . Orif hip fracture Right 03/08/2011    Bean,MD  . Skin lesion excision Right 02/04/2011    Abdomen lesion  spongiotic dermatitis-Taffeen, MD  . Skin cancer excision Left 10/12/2012    lower leg Dr. [place]  . Tonsillectomy    . Total hip arthroplasty Right 05/28/2014    Procedure: TOTAL HIP ARTHROPLASTY ANTERIOR APPROACH;  Surgeon: Melrose Nakayama, MD;  Location: [place];  Service: Orthopedics;  Laterality: Right;  . Hardware removal Right 05/28/2014    Procedure: HARDWARE REMOVAL;  Surgeon: Melrose Nakayama, MD;  Location: [place];  Service: Orthopedics;  Laterality: Right;   Social History:   reports that she has never smoked. She has never used smokeless tobacco. She reports that she drinks alcohol. She reports that she does not use illicit drugs.  Family History  Problem Relation Age of Onset  . Heart disease Mother   . Cancer Father   . Emphysema Brother   . Alzheimer's disease Brother     Medications: Patient's Medications  New Prescriptions   No medications on file  Previous Medications   ACETAMINOPHEN (TYLENOL)  500 MG TABLET    Take 1,000 mg by mouth every 6 (six) hours as needed for moderate pain.    ASPIRIN EC 325 MG EC TABLET    Take 1 tablet (325 mg total) by mouth 2 (two) times daily after a meal.   BILBERRY 1000 MG CAPS    Take 1,000 mg by mouth daily.    CALCIUM CARBONATE-VITAMIN D (CALCIUM 600/VITAMIN D PO)    Take 600 mg by mouth 2 (two) times daily.    CALCIUM POLYCARBOPHIL (FIBERCON PO)    Take 1 tablet by mouth 2 (two) times daily.    CHOLECALCIFEROL (VITAMIN D) 400 UNITS TABS    Take 400 Units by mouth daily.    CRANBERRY EXTRACT PO    Take 1 tablet by mouth daily.   FISH OIL-OMEGA-3 FATTY ACIDS 1000 MG CAPSULE    Take 1 g by mouth daily.    GLUCOSAMINE-CHONDROITIN (GLUCOSAMINE CHONDR COMPLEX PO)    Take 1 tablet by mouth 2 (two) times daily. for joints   MIRABEGRON ER (MYRBETRIQ) 25 MG TB24 TABLET    Take 25 mg by mouth daily.   MULTIPLE VITAMINS-MINERALS (PRESERVISION/LUTEIN) CAPS    Take 1 capsule by mouth 2 (two) times daily.   NITROFURANTOIN (MACRODANTIN) 50 MG CAPSULE     Take 50 mg by mouth at bedtime. Prophylaxis course (UTI's) 42 day course 04/26/14   POLYETHYLENE GLYCOL (MIRALAX / GLYCOLAX) PACKET    Take 17 g by mouth daily.   PROBIOTIC PRODUCT (RESTORA) CAPS    Take 1 capsule by mouth daily.   TIZANIDINE (ZANAFLEX) 2 MG TABLET    Take 1 tablet (2 mg total) by mouth every 8 (eight) hours as needed for muscle spasms.   TRAMADOL (ULTRAM) 50 MG TABLET    Take 2 tablets (100 mg total) by mouth every 6 (six) hours as needed for moderate pain or severe pain.   ZOLPIDEM (AMBIEN) 5 MG TABLET    1/2 by mouth daily at bedtime as needed for rest  Modified Medications   No medications on file  Discontinued Medications   No medications on file     Physical Exam: Physical Exam  Constitutional: She is oriented to person, place, and time. She appears well-developed and well-nourished. No distress.  HENT:  HOH  Eyes:  Corrective lenses.  Neck: No JVD present. No tracheal deviation present. No thyromegaly present.  Cardiovascular: Normal rate, regular rhythm, normal heart sounds and intact distal pulses.  Exam reveals no gallop and no friction rub.   No murmur heard. Pulmonary/Chest: Effort normal and breath sounds normal. She has no rales.  Abdominal: Soft. Bowel sounds are normal. She exhibits no distension and no mass. There is no tenderness.  Genitourinary:  Foley  Musculoskeletal: Normal range of motion. She exhibits edema. She exhibits no tenderness.  Poor balance; uses a cane and walker. RLE chronic trace edema   Lymphadenopathy:    She has no cervical adenopathy.  Neurological: She is alert and oriented to person, place, and time. No cranial nerve deficit.  Skin: Skin is warm and dry. There is pallor.  Left facial bruise R hip surgical incision is intact, mild inflammation peri surgical wound RLE edema 1+  Psychiatric: She has a normal mood and affect. Her behavior is normal. Thought content normal.   Filed Vitals:   06/18/14 1207  BP: 143/74    Pulse: 84  Temp: 97.6 F (36.4 C)  TempSrc: Tympanic  Resp: 20      Labs  reviewed: Basic Metabolic Panel:  Recent Labs  05/30/14 1041  06/01/14 0424  06/02/14 0550  06/03/14 0525 06/06/14 06/13/14  NA 123*  < > 131*  < > 129*  < > 129* 134* 135*  K  --   < > 4.3  --  4.2  --  4.2 4.7 4.5  CL  --   < > 97*  --  93*  --  95*  --   --   CO2  --   < > 30  --  30  --  28  --   --   GLUCOSE  --   < > 118*  --  122*  --  107*  --   --   BUN  --   < > <5*  --  6  --  <5* 10 10  CREATININE  --   < > 0.49  --  0.58  --  0.48 0.6 0.6  CALCIUM  --   < > 8.2*  --  8.4*  --  8.3*  --   --   TSH 5.349*  --   --   --   --   --   --   --   --   < > = values in this interval not displayed. Liver Function Tests:  Recent Labs  10/16/13 1143 11/19/13  AST 21 20  ALT 15 16  ALKPHOS 93 97  BILITOT 0.4  --   PROT 6.6  --   ALBUMIN 2.9*  --     Recent Labs  10/16/13 1143  LIPASE 16   No results for input(s): AMMONIA in the last 8760 hours. CBC:  Recent Labs  10/16/13 1143  05/17/14 1332 05/22/14 1304 05/29/14 0500 05/30/14 0533 05/31/14 0028  WBC 11.6*  < > 9.1 8.3 8.8 11.6* 10.1  NEUTROABS 8.1*  --  5.1 6.2  --   --   --   HGB 10.3*  < > 12.9 12.8 7.7* 10.1* 10.1*  HCT 31.3*  < > 39.8 39.5 23.1* 30.1* 30.1*  MCV 85.5  --  90.7 90.0 88.8 84.6 85.0  PLT 295  < > 276 236 183 154 162  < > = values in this interval not displayed. Lipid Panel: No results for input(s): CHOL, HDL, LDLCALC, TRIG, CHOLHDL, LDLDIRECT in the last 8760 hours.  Past Procedures: 05/22/14 CT cervical spine and head c/o contrast:  IMPRESSION: Mild left frontal scalp hematoma. Mild diffuse cortical atrophy. Mild chronic ischemic white matter disease. No acute intracranial abnormality seen.  Multilevel degenerative disc disease. No acute abnormality seen in the cervical spine.  06/03/14 Venous Doppler BLE: negative for DVT  Assessment/Plan Insomnia 06/18/14 SLUMS 23/30(20-24 mild neurocognitive  disorder) 06/18/14 Zolpidem 5mg  daily    UTI (urinary tract infection) 04/16/13 pseudomonas aeruginosa: tx with Cipro 06/15/14 P. Aeruginosa Cassandra Glenn 500mg  IM q12h x 7 days.     Hyponatremia 06/13/14 Na 135    Primary osteoarthritis of right hip TOTAL HIP ARTHROPLASTY ANTERIOR APPROACH HARDWARE REMOVAL on 05/28/2014 Continue Rehab, taking ASA 325mg  bid    Postoperative anemia due to acute blood loss 05/31/14 Hgb 10.1   Constipation Stable, takes Fibercon and MiraLax    Acute urinary retention F/u urology.      Family/ Staff Communication: observe the patient.   Goals of Care: [place]  Labs/tests ordered: none

## 2014-06-18 NOTE — Assessment & Plan Note (Signed)
06/18/14 SLUMS 23/30(20-24 mild neurocognitive disorder) 06/18/14 Zolpidem 5mg  daily

## 2014-06-18 NOTE — Assessment & Plan Note (Signed)
F/u urology.  

## 2014-06-18 NOTE — Assessment & Plan Note (Signed)
Stable, takes Fibercon and MiraLax

## 2014-06-18 NOTE — Assessment & Plan Note (Signed)
05/31/14 Hgb 10.1

## 2014-06-18 NOTE — Assessment & Plan Note (Signed)
TOTAL HIP ARTHROPLASTY ANTERIOR APPROACH HARDWARE REMOVAL on 05/28/2014 Continue Rehab, taking ASA 325mg  bid

## 2014-06-18 NOTE — Assessment & Plan Note (Signed)
04/16/13 pseudomonas aeruginosa: tx with Cipro 06/15/14 P. Aeruginosa [name] 500mg  IM q12h x 7 days.

## 2014-06-18 NOTE — Assessment & Plan Note (Signed)
06/13/14 Na 135

## 2014-06-21 DIAGNOSIS — Z96641 Presence of right artificial hip joint: Secondary | ICD-10-CM | POA: Diagnosis not present

## 2014-06-25 ENCOUNTER — Non-Acute Institutional Stay: Payer: Medicare Other | Admitting: Nurse Practitioner

## 2014-06-25 ENCOUNTER — Encounter: Payer: Self-pay | Admitting: Nurse Practitioner

## 2014-06-25 DIAGNOSIS — E871 Hypo-osmolality and hyponatremia: Secondary | ICD-10-CM | POA: Diagnosis not present

## 2014-06-25 DIAGNOSIS — N390 Urinary tract infection, site not specified: Secondary | ICD-10-CM

## 2014-06-25 DIAGNOSIS — K5900 Constipation, unspecified: Secondary | ICD-10-CM

## 2014-06-25 DIAGNOSIS — R338 Other retention of urine: Secondary | ICD-10-CM | POA: Diagnosis not present

## 2014-06-25 DIAGNOSIS — G4700 Insomnia, unspecified: Secondary | ICD-10-CM

## 2014-06-25 DIAGNOSIS — M1611 Unilateral primary osteoarthritis, right hip: Secondary | ICD-10-CM

## 2014-06-25 DIAGNOSIS — B372 Candidiasis of skin and nail: Secondary | ICD-10-CM | POA: Diagnosis not present

## 2014-06-25 DIAGNOSIS — D62 Acute posthemorrhagic anemia: Secondary | ICD-10-CM | POA: Diagnosis not present

## 2014-06-25 NOTE — Assessment & Plan Note (Signed)
Stable, takes Fibercon and MiraLax

## 2014-06-25 NOTE — Progress Notes (Signed)
Patient ID: Cassandra Glenn, female   DOB: 03-Nov-1920, 81 y.o.   MRN: [id number]   Code Status: DNR  Allergies  Allergen Reactions  . Macrodantin [Nitrofurantoin Macrocrystal] Other (See Comments)    Unknown allergic reaction many years ago/ tolerating 50 mg macrodantin   . Septra [Sulfamethoxazole-Trimethoprim] Other (See Comments)    Unknown allergic reaction many years ago    Chief Complaint  Patient presents with  . Medical Management of Chronic Issues  . Acute Visit    rash in the skin folds under the R+L breasts.     HPI: Patient is a 81 y.o. female seen in the [facility] at [facility] today for evaluation of rash under the R+L breasts and chronic medical conditions.   Hospitalized 05/28/2014-06/03/2014 for Primary osteoarthritis of right hip Hyponatremia Postoperative anemia due to acute blood loss  TOTAL HIP ARTHROPLASTY ANTERIOR APPROACH HARDWARE REMOVAL on 05/28/2014   Problem List Items Addressed This Visit    Insomnia (Chronic)    06/18/14 SLUMS 23/30(20-24 mild neurocognitive disorder) 06/18/14 Zolpidem 5mg  daily      Hyponatremia (Chronic)    06/13/14 Na 135      UTI (urinary tract infection) (Chronic)    04/16/13 pseudomonas aeruginosa: tx with Cipro 06/15/14 P. Aeruginosa Cassandra Glenn 500mg  IM q12h x 7 days.  06/25/14 fully treated and she is asymptomatic       Primary osteoarthritis of right hip (Chronic)    TOTAL HIP ARTHROPLASTY ANTERIOR APPROACH HARDWARE REMOVAL on 05/28/2014       Postoperative anemia due to acute blood loss    05/31/14 Hgb 10.1       Acute urinary retention    06/18/14 Urology, Foley Cath removed, able to void, albeit very little, hx of cystocele and incomplete bladder emptying. Prn I+O cath       Constipation    Stable, takes Fibercon and MiraLax       Candidiasis of skin - Primary    Rash in the skin folds under the R+L skin folds. Will apply Nystatin powder bid to the affected area until healed.          Review of Systems:  Review  of Systems  Constitutional: Negative for fever, activity change, fatigue and unexpected weight change.  HENT: Positive for hearing loss. Negative for ear pain.   Eyes:       History of loss of visual acuity and macular degeneration.  Respiratory:       History of breast discomfort. History of removal of a lump of cancer from the right breast. Dyspnea on exertion.  Cardiovascular: Positive for leg swelling. Negative for chest pain and palpitations.       Right RLE  Gastrointestinal:       Chronic sluggish stools and straining. She frequently complains that her intestines are not working right. There is a previous history of diverticulosis. Her last colonoscopy was in 2008. She has been seen by Dr. Paulita Fujita in the past. Complains of bloated feeling.  Genitourinary:       Saw Dr. Louis Meckel, urologist. She is being taught self-catheterization for a bladder that does not empty well.  Vaginal discomfort. Managed by Dr. Claudean Kinds. History of elevated CEA 125. Normal ultrasound of the abdomen and genitourinary tract.  Musculoskeletal: Positive for joint swelling, arthralgias and gait problem.       Chronic back pain. Pain down the right leg. Some restriction of joint motion of the right hip.. Patient has a shorter right leg and has a lift  in the right shoe. Now seeing Dr. Rhona Raider. There is generalized stiffness in the joints. There is a history of spinal stenosis. Left third finger catches on extension sometimes. There is a trigger finger. Pain at the right hip with ROM and weight bearing since the surgery.   Skin:       History of skin discoloration and changes of nails. Previous history of folliculitis of the left lower leg.  R hip surgical incision healed. Rash under the breasts mainly right   Allergic/Immunologic: Negative.   Neurological: Negative for dizziness, tremors, seizures and numbness.       History of spinal stenosis with neurogenic claudication affecting the right leg. Complains of  pain in the buttocks that is suggestive of neuralgia from her spinal stenosis.  Hematological: Bruises/bleeds easily.  Psychiatric/Behavioral: Negative.  Negative for hallucinations, behavioral problems, confusion, sleep disturbance, dysphoric mood, decreased concentration and agitation. The patient is not nervous/anxious and is not hyperactive.      Past Medical History  Diagnosis Date  . Macular degeneration   . HOH (hard of hearing)   . Vaginitis and vulvovaginitis 02/29/2012  . Abnormalities of the hair 02/29/2012  . Spinal stenosis, lumbar region, with neurogenic claudication 11/02/2011  . Trigger finger (acquired) 11/02/2011  . Right bundle branch block 08/30/2011  . Edema 05/07/2011  . Contact dermatitis and other eczema, due to unspecified cause 05/07/2011  . Hypopotassemia 03/26/2011  . Candidiasis of skin and nails 03/19/2011  . Hyposmolality and/or hyponatremia 03/19/2011  . Diverticulosis of colon (without mention of hemorrhage) 03/19/2011  . Unspecified constipation 03/19/2011  . Pain in joint, pelvic region and thigh 03/19/2011  . Lumbago 03/19/2011  . Spasm of muscle 03/19/2011  . Osteoporosis, unspecified 03/19/2011  . Insomnia, unspecified 03/19/2011  . Other malaise and fatigue 03/19/2011  . Impaired fasting glucose 03/19/2011  . Hypotension, unspecified 03/18/2011  . Closed fracture of base of neck of femur 03/18/2011  . Arthritis   . Shortness of breath   . Kidney infection   . Cancer   . Malignant neoplasm of breast (female), unspecified site 10/28/2002   Past Surgical History  Procedure Laterality Date  . Cataract extraction  2010    bialteral  . Hip pinning,cannulated  03/08/2011    Procedure: CANNULATED HIP PINNING;  Surgeon: Johnn Hai, MD;  Location: [facility];  Service: Orthopedics;  Laterality: Right;  . Eye surgery      cataract  . Fracture surgery Left 09/1980    ankle  . Breast surgery Bilateral 2005-10/27/2004    lumpectomy- Streck,MD    . Squamous cell carcinoma excision Right 02/04/2011    forearm- Taffeen, MD  . Orif hip fracture Right 03/08/2011    Bean,MD  . Skin lesion excision Right 02/04/2011    Abdomen lesion spongiotic dermatitis-Taffeen, MD  . Skin cancer excision Left 10/12/2012    lower leg Dr. [place]ble [place]  . Tonsillectomy    . Total hip arthroplasty Right 05/28/2014    Procedure: TOTAL HIP ARTHROPLASTY ANTERIOR APPROACH;  Surgeon: Melrose Nakayama, MD;  Location: [place];  Service: Orthopedics;  Laterality: Right;  . Hardware removal Right 05/28/2014    Procedure: HARDWARE REMOVAL;  Surgeon: Melrose Nakayama, MD;  Location: [facility];  Service: Orthopedics;  Laterality: Right;   Social History:   reports that she has never smoked. She has never used smokeless tobacco. She reports that she drinks alcohol. She reports that she does not use illicit drugs.  Family History  Problem Relation Age of Onset  .  Heart disease Mother   . Cancer Father   . Emphysema Brother   . Alzheimer's disease Brother     Medications: Patient's Medications  New Prescriptions   No medications on file  Previous Medications   ACETAMINOPHEN (TYLENOL) 500 MG TABLET    Take 1,000 mg by mouth every 6 (six) hours as needed for moderate pain.    ASPIRIN EC 325 MG EC TABLET    Take 1 tablet (325 mg total) by mouth 2 (two) times daily after a meal.   BILBERRY 1000 MG CAPS    Take 1,000 mg by mouth daily.    CALCIUM CARBONATE-VITAMIN D (CALCIUM 600/VITAMIN D PO)    Take 600 mg by mouth 2 (two) times daily.    CALCIUM POLYCARBOPHIL (FIBERCON PO)    Take 1 tablet by mouth 2 (two) times daily.    CHOLECALCIFEROL (VITAMIN D) 400 UNITS TABS    Take 400 Units by mouth daily.    CRANBERRY EXTRACT PO    Take 1 tablet by mouth daily.   FISH OIL-OMEGA-3 FATTY ACIDS 1000 MG CAPSULE    Take 1 g by mouth daily.    GLUCOSAMINE-CHONDROITIN (GLUCOSAMINE CHONDR COMPLEX PO)    Take 1 tablet by mouth 2 (two) times daily. for joints   MIRABEGRON ER (MYRBETRIQ) 25 MG  TB24 TABLET    Take 25 mg by mouth daily.   MULTIPLE VITAMINS-MINERALS (PRESERVISION/LUTEIN) CAPS    Take 1 capsule by mouth 2 (two) times daily.   NITROFURANTOIN (MACRODANTIN) 50 MG CAPSULE    Take 50 mg by mouth at bedtime. Prophylaxis course (UTI's) 42 day course 04/26/14   POLYETHYLENE GLYCOL (MIRALAX / GLYCOLAX) PACKET    Take 17 g by mouth daily.   PROBIOTIC PRODUCT (RESTORA) CAPS    Take 1 capsule by mouth daily.   TIZANIDINE (ZANAFLEX) 2 MG TABLET    Take 1 tablet (2 mg total) by mouth every 8 (eight) hours as needed for muscle spasms.   TRAMADOL (ULTRAM) 50 MG TABLET    Take 2 tablets (100 mg total) by mouth every 6 (six) hours as needed for moderate pain or severe pain.   ZOLPIDEM (AMBIEN) 5 MG TABLET    1/2 by mouth daily at bedtime as needed for rest  Modified Medications   No medications on file  Discontinued Medications   No medications on file     Physical Exam: Physical Exam  Constitutional: She is oriented to person, place, and time. She appears well-developed and well-nourished. No distress.  HENT:  HOH  Eyes:  Corrective lenses.  Neck: No JVD present. No tracheal deviation present. No thyromegaly present.  Cardiovascular: Normal rate, regular rhythm, normal heart sounds and intact distal pulses.  Exam reveals no gallop and no friction rub.   No murmur heard. Pulmonary/Chest: Effort normal and breath sounds normal. She has no rales.  Abdominal: Soft. Bowel sounds are normal. She exhibits no distension and no mass. There is no tenderness.  Genitourinary:  Foley removed, I+O cath almost nightly  Musculoskeletal: Normal range of motion. She exhibits no edema or tenderness.  Poor balance; uses a cane and walker.   Lymphadenopathy:    She has no cervical adenopathy.  Neurological: She is alert and oriented to person, place, and time. No cranial nerve deficit.  Skin: Skin is warm and dry. There is pallor.  R hip surgical incision healed. Small prominent area at the  distal region of the surgical scar w/o redness or tenderness. Rash in the skin  fold under the breasts-mainly left.    Psychiatric: She has a normal mood and affect. Her behavior is normal. Thought content normal.   Filed Vitals:   06/25/14 1605  BP: 148/84  Pulse: 80  Temp: 97.4 F (36.3 C)  TempSrc: Tympanic  Resp: 18      Labs reviewed: Basic Metabolic Panel:  Recent Labs  05/30/14 1041  06/01/14 0424  06/02/14 0550  06/03/14 0525 06/06/14 06/13/14  NA 123*  < > 131*  < > 129*  < > 129* 134* 135*  K  --   < > 4.3  --  4.2  --  4.2 4.7 4.5  CL  --   < > 97*  --  93*  --  95*  --   --   CO2  --   < > 30  --  30  --  28  --   --   GLUCOSE  --   < > 118*  --  122*  --  107*  --   --   BUN  --   < > <5*  --  6  --  <5* 10 10  CREATININE  --   < > 0.49  --  0.58  --  0.48 0.6 0.6  CALCIUM  --   < > 8.2*  --  8.4*  --  8.3*  --   --   TSH 5.349*  --   --   --   --   --   --   --   --   < > = values in this interval not displayed. Liver Function Tests:  Recent Labs  10/16/13 1143 11/19/13  AST 21 20  ALT 15 16  ALKPHOS 93 97  BILITOT 0.4  --   PROT 6.6  --   ALBUMIN 2.9*  --     Recent Labs  10/16/13 1143  LIPASE 16   No results for input(s): AMMONIA in the last 8760 hours. CBC:  Recent Labs  10/16/13 1143  05/17/14 1332 05/22/14 1304 05/29/14 0500 05/30/14 0533 05/31/14 0028  WBC 11.6*  < > 9.1 8.3 8.8 11.6* 10.1  NEUTROABS 8.1*  --  5.1 6.2  --   --   --   HGB 10.3*  < > 12.9 12.8 7.7* 10.1* 10.1*  HCT 31.3*  < > 39.8 39.5 23.1* 30.1* 30.1*  MCV 85.5  --  90.7 90.0 88.8 84.6 85.0  PLT 295  < > 276 236 183 154 162  < > = values in this interval not displayed. Lipid Panel: No results for input(s): CHOL, HDL, LDLCALC, TRIG, CHOLHDL, LDLDIRECT in the last 8760 hours.  Past Procedures: 05/22/14 CT cervical spine and head c/o contrast:  IMPRESSION: Mild left frontal scalp hematoma. Mild diffuse cortical atrophy. Mild chronic ischemic white matter  disease. No acute intracranial abnormality seen.  Multilevel degenerative disc disease. No acute abnormality seen in the cervical spine.  06/03/14 Venous Doppler BLE: negative for DVT  Assessment/Plan Candidiasis of skin Rash in the skin folds under the R+L skin folds. Will apply Nystatin powder bid to the affected area until healed.   Constipation Stable, takes Fibercon and MiraLax   Acute urinary retention 06/18/14 Urology, Foley Cath removed, able to void, albeit very little, hx of cystocele and incomplete bladder emptying. Prn I+O cath   Postoperative anemia due to acute blood loss 05/31/14 Hgb 10.1   Primary osteoarthritis of right hip TOTAL HIP ARTHROPLASTY ANTERIOR APPROACH HARDWARE REMOVAL  on 05/28/2014   UTI (urinary tract infection) 04/16/13 pseudomonas aeruginosa: tx with Cipro 06/15/14 P. Aeruginosa Cassandra Glenn 500mg  IM q12h x 7 days.  06/25/14 fully treated and she is asymptomatic   Hyponatremia 06/13/14 Na 135  Insomnia 06/18/14 SLUMS 23/30(20-24 mild neurocognitive disorder) 06/18/14 Zolpidem 5mg  daily    Family/ Staff Communication: observe the patient.   Goals of Care: IL  Labs/tests ordered: none

## 2014-06-25 NOTE — Assessment & Plan Note (Signed)
TOTAL HIP ARTHROPLASTY ANTERIOR APPROACH HARDWARE REMOVAL on 05/28/2014

## 2014-06-25 NOTE — Assessment & Plan Note (Signed)
06/18/14 SLUMS 23/30(20-24 mild neurocognitive disorder) 06/18/14 Zolpidem 5mg  daily

## 2014-06-25 NOTE — Assessment & Plan Note (Signed)
Rash in the skin folds under the R+L skin folds. Will apply Nystatin powder bid to the affected area until healed.

## 2014-06-25 NOTE — Assessment & Plan Note (Signed)
04/16/13 pseudomonas aeruginosa: tx with Cipro 06/15/14 P. Aeruginosa Cassandra Glenn 500mg  IM q12h x 7 days.  06/25/14 fully treated and she is asymptomatic

## 2014-06-25 NOTE — Assessment & Plan Note (Signed)
06/18/14 Urology, Foley Cath removed, able to void, albeit very little, hx of cystocele and incomplete bladder emptying. Prn I+O cath

## 2014-06-25 NOTE — Assessment & Plan Note (Signed)
06/13/14 Na 135

## 2014-06-25 NOTE — Assessment & Plan Note (Signed)
05/31/14 Hgb 10.1

## 2014-07-04 DIAGNOSIS — Z961 Presence of intraocular lens: Secondary | ICD-10-CM | POA: Diagnosis not present

## 2014-07-04 DIAGNOSIS — H3562 Retinal hemorrhage, left eye: Secondary | ICD-10-CM | POA: Diagnosis not present

## 2014-07-04 DIAGNOSIS — H3531 Nonexudative age-related macular degeneration: Secondary | ICD-10-CM | POA: Diagnosis not present

## 2014-07-04 DIAGNOSIS — H35373 Puckering of macula, bilateral: Secondary | ICD-10-CM | POA: Diagnosis not present

## 2014-07-04 DIAGNOSIS — H43813 Vitreous degeneration, bilateral: Secondary | ICD-10-CM | POA: Diagnosis not present

## 2014-07-04 DIAGNOSIS — H3532 Exudative age-related macular degeneration: Secondary | ICD-10-CM | POA: Diagnosis not present

## 2014-07-16 ENCOUNTER — Encounter: Payer: Self-pay | Admitting: Internal Medicine

## 2014-07-18 ENCOUNTER — Other Ambulatory Visit: Payer: Self-pay | Admitting: *Deleted

## 2014-07-18 DIAGNOSIS — F0390 Unspecified dementia without behavioral disturbance: Secondary | ICD-10-CM | POA: Diagnosis not present

## 2014-07-18 DIAGNOSIS — E871 Hypo-osmolality and hyponatremia: Secondary | ICD-10-CM | POA: Diagnosis not present

## 2014-07-18 LAB — BASIC METABOLIC PANEL
BUN: 13 mg/dL (ref 4–21)
Creatinine: 0.7 mg/dL (ref 0.5–1.1)
GLUCOSE: 130 mg/dL
POTASSIUM: 4.1 mmol/L (ref 3.4–5.3)
Sodium: 135 mmol/L — AB (ref 137–147)

## 2014-07-18 MED ORDER — TRAMADOL HCL 50 MG PO TABS: 100.0000 mg | ORAL_TABLET | Freq: Four times a day (QID) | ORAL | Status: DC | PRN

## 2014-07-22 DIAGNOSIS — Z96641 Presence of right artificial hip joint: Secondary | ICD-10-CM | POA: Diagnosis not present

## 2014-07-22 DIAGNOSIS — Z471 Aftercare following joint replacement surgery: Secondary | ICD-10-CM | POA: Diagnosis not present

## 2014-07-23 ENCOUNTER — Encounter: Payer: Self-pay | Admitting: Internal Medicine

## 2014-07-23 ENCOUNTER — Non-Acute Institutional Stay: Payer: Medicare Other | Admitting: Internal Medicine

## 2014-07-23 VITALS — BP 116/58 | HR 68 | Temp 87.6°F | Wt 129.0 lb

## 2014-07-23 DIAGNOSIS — E871 Hypo-osmolality and hyponatremia: Secondary | ICD-10-CM

## 2014-07-23 DIAGNOSIS — N390 Urinary tract infection, site not specified: Secondary | ICD-10-CM | POA: Diagnosis not present

## 2014-07-23 DIAGNOSIS — F329 Major depressive disorder, single episode, unspecified: Secondary | ICD-10-CM

## 2014-07-23 DIAGNOSIS — R350 Frequency of micturition: Secondary | ICD-10-CM | POA: Diagnosis not present

## 2014-07-23 DIAGNOSIS — F32A Depression, unspecified: Secondary | ICD-10-CM | POA: Insufficient documentation

## 2014-07-23 HISTORY — DX: Depression, unspecified: F32.A

## 2014-07-23 NOTE — Progress Notes (Signed)
Patient ID: Cassandra Glenn, female   DOB: 07-02-1920, 81 y.o.   MRN: [id number]    [facility] Number: AL 05  Place of Service: Clinic (12)     Allergies  Allergen Reactions  . Macrodantin [Nitrofurantoin Macrocrystal] Other (See Comments)    Unknown allergic reaction many years ago/ tolerating 50 mg macrodantin   . Septra [Sulfamethoxazole-Trimethoprim] Other (See Comments)    Unknown allergic reaction many years ago    Chief Complaint  Patient presents with  . Medical Management of Chronic Issues    anemia, hyponatremia, constipation  . s/p right hip surgery    05/28/14 Dr. Rhona Raider    HPI:   Patient has moved from the skilled nursing facility and to the assisted living unit. She is doing well. She still requires some assistance with showers, but is dressing herself, walking without any significant discomfort on a walker with 2 front wheels and rear skids, and is looking forward to a return to her apartment in about 2 weeks.  She continues to have urinary frequency. Bladder is poorly contractile. She does self catheterizations. She is to see her urologist in September 2016.  She is subject to recurrent urinary infections. She remains on trimethoprim 100 mg daily.  Patient and daughter who is with her today state that she is more fatigued and tired that seemed to be the case a couple of weeks ago. The one her she could have a recurrent UTI. She denies dysuria, fever, chills. Last urine specimen was checked in early June 2016 and seemed normal.  Although she has not been nervous or depressed, her daughter asked if the fatigue could be related to depression since her Effexor was discontinued because of hyponatremia that was suspected related to this drug.    Medications: Patient's Medications  New Prescriptions   No medications on file  Previous Medications   ACETAMINOPHEN (TYLENOL) 500 MG TABLET    Take 1,000 mg by mouth every 6 (six) hours  as needed for moderate pain.    ASPIRIN 81 MG CHEWABLE TABLET    Chew by mouth daily.   BILBERRY 1000 MG CAPS    Take 1,000 mg by mouth daily.    CALCIUM CARBONATE-VITAMIN D (CALCIUM 600/VITAMIN D PO)    Take 600 mg by mouth 2 (two) times daily.    CALCIUM POLYCARBOPHIL (FIBERCON PO)    Take 1 tablet by mouth 2 (two) times daily.    CHOLECALCIFEROL (VITAMIN D) 400 UNITS TABS    Take 400 Units by mouth daily.    CRANBERRY EXTRACT PO    Take 1 tablet by mouth daily.   FISH OIL-OMEGA-3 FATTY ACIDS 1000 MG CAPSULE    Take 1 g by mouth daily.    GLUCOSAMINE-CHONDROITIN (GLUCOSAMINE CHONDR COMPLEX PO)    Take 1 tablet by mouth 3 (three) times daily. for joints   MIRABEGRON ER (MYRBETRIQ) 25 MG TB24 TABLET    Take 25 mg by mouth daily.   MULTIPLE VITAMINS-MINERALS (PRESERVISION/LUTEIN) CAPS    Take 1 capsule by mouth 2 (two) times daily.   NITROFURANTOIN (MACRODANTIN) 50 MG CAPSULE    Take 50 mg by mouth at bedtime. Prophylaxis course (UTI's) 42 day course 04/26/14   POLYETHYLENE GLYCOL (MIRALAX / GLYCOLAX) PACKET    Take 17 g by mouth daily.   PROBIOTIC PRODUCT (RESTORA) CAPS    Take 1 capsule by mouth daily.   TIZANIDINE (ZANAFLEX) 2 MG TABLET    Take 1 tablet (2  mg total) by mouth every 8 (eight) hours as needed for muscle spasms.   TRAMADOL (ULTRAM) 50 MG TABLET    Take 2 tablets (100 mg total) by mouth every 6 (six) hours as needed for moderate pain or severe pain.   TRIMETHOPRIM (TRIMPEX) 100 MG TABLET    Take one tablet at bedtime   ZOLPIDEM (AMBIEN) 5 MG TABLET    1/2 by mouth daily at bedtime as needed for rest  Modified Medications   No medications on file  Discontinued Medications   ASPIRIN EC 325 MG EC TABLET    Take 1 tablet (325 mg total) by mouth 2 (two) times daily after a meal.     Review of Systems  Constitutional: Negative for fever, activity change, fatigue and unexpected weight change.  HENT: Positive for hearing loss. Negative for ear pain.   Eyes:       History of loss of  visual acuity and macular degeneration.  Respiratory:       History of breast discomfort. History of removal of a lump of cancer from the right breast. Dyspnea on exertion.  Cardiovascular: Positive for leg swelling. Negative for chest pain and palpitations.  Gastrointestinal:       Chronic sluggish stools and straining. She frequently complains that her intestines are not working right. There is a previous history of diverticulosis. Her last colonoscopy was in 2008. She has been seen by Dr. Paulita Fujita in the past. Constipation - she associates with Tramadol  Genitourinary:       Complains of a slow, weak urinary stream. Denies dysuria. Has increased frequency and nocturia x 4. Saw Dr. Louis Meckel, urologist. She is being taught self-catheterization for a bladder that does not empty well. Foley catheter in place following hospitalization.  Vaginal discomfort. Managed by Dr. Claudean Kinds. History of elevated CEA 125. Normal ultrasound of the abdomen and genitourinary tract.  Musculoskeletal:       Chronic back pain.  Patient has a shorter right leg and has a lift in the right shoe. Now seeing Dr. Rhona Raider.  Right hip arthroplasty 5/16 There is generalized stiffness in the joints. There is a history of spinal stenosis. Left third finger catches on extension sometimes. There is a trigger finger. Pain at the left iliac crest and left anterior ribs near the sternum.  Skin:       History of skin discoloration and changes of nails. Previous history of folliculitis of the left lower leg. Folliculitis has cleared. Denies itching or rash. Hematoma left supraorbital area approximately 3-4 weeks ago. Now resolving with large ecchymosis remaining on the left cheek and working its way down the left neck.  Allergic/Immunologic: Negative.   Neurological: Negative for dizziness, tremors, seizures and numbness.       History of spinal stenosis with neurogenic claudication affecting the right leg. Complains of pain in  the buttocks that is suggestive of neuralgia from her spinal stenosis.  Hematological: Bruises/bleeds easily.  Psychiatric/Behavioral: Negative.     Filed Vitals:   07/23/14 0911  BP: 116/58  Pulse: 68  Temp: 87.6 F (30.9 C)  TempSrc: Oral  Weight: 129 lb (58.514 kg)  SpO2: 99%   Body mass index is 25.19 kg/(m^2).  Physical Exam  Constitutional: She is oriented to person, place, and time. She appears well-developed and well-nourished. No distress.  HENT:  HOH  Eyes:  Corrective lenses.  Neck: No JVD present. No tracheal deviation present. No thyromegaly present.  Cardiovascular: Normal rate, regular rhythm and normal heart sounds.  Exam reveals no gallop and no friction rub.   No murmur heard. Pulses in RLE not palpable, has 2+ edema which could be obscuring pulses  Pulmonary/Chest: Effort normal and breath sounds normal. She has no rales.  Abdominal: Soft. Bowel sounds are normal. She exhibits no distension and no mass. There is no tenderness.  Musculoskeletal: Normal range of motion. She exhibits edema (RLE). She exhibits no tenderness.  Poor balance; using walker with PT following right hip replacement  Lymphadenopathy:    She has no cervical adenopathy.  Neurological: She is alert and oriented to person, place, and time. No cranial nerve deficit.  Skin: Skin is warm and dry. There is pallor.  Old ecchymosis to left face, following fall and blow to right orbit. Wound right hip is cleasn and not inflamed.  Psychiatric: She has a normal mood and affect. Her behavior is normal. Thought content normal.     Labs reviewed: Nursing Home on 07/23/2014  Component Date Value Ref Range Status  . Glucose 07/18/2014 130   Final  . BUN 07/18/2014 13  4 - 21 mg/dL Final  . Creatinine 07/18/2014 0.7  0.5 - 1.1 mg/dL Final  . Potassium 07/18/2014 4.1  3.4 - 5.3 mmol/L Final  . Sodium 07/18/2014 135* 137 - 147 mmol/L Final  Lab on 06/14/2014  Component Date Value Ref Range Status    . Glucose 06/13/2014 99   Final  . BUN 06/13/2014 10  4 - 21 mg/dL Final  . Creatinine 06/13/2014 0.6  0.5 - 1.1 mg/dL Final  . Potassium 06/13/2014 4.5  3.4 - 5.3 mmol/L Final  . Sodium 06/13/2014 135* 137 - 147 mmol/L Final  Lab on 06/07/2014  Component Date Value Ref Range Status  . BUN 06/06/2014 10  4 - 21 mg/dL Final  . Creatinine 06/06/2014 0.6  0.5 - 1.1 mg/dL Final  . Potassium 06/06/2014 4.7  3.4 - 5.3 mmol/L Final  . Sodium 06/06/2014 134* 137 - 147 mmol/L Final  Admission on 05/28/2014, Discharged on 06/03/2014  Component Date Value Ref Range Status  . WBC 05/29/2014 8.8  4.0 - 10.5 K/uL Final  . RBC 05/29/2014 2.60* 3.87 - 5.11 MIL/uL Final  . Hemoglobin 05/29/2014 7.7* 12.0 - 15.0 g/dL Final  . HCT 05/29/2014 23.1* 36.0 - 46.0 % Final  . MCV 05/29/2014 88.8  78.0 - 100.0 fL Final  . MCH 05/29/2014 29.6  26.0 - 34.0 pg Final  . MCHC 05/29/2014 33.3  30.0 - 36.0 g/dL Final  . RDW 05/29/2014 14.4  11.5 - 15.5 % Final  . Platelets 05/29/2014 183  150 - 400 K/uL Final  . Sodium 05/29/2014 125* 135 - 145 mmol/L Final  . Potassium 05/29/2014 4.2  3.5 - 5.1 mmol/L Final  . Chloride 05/29/2014 92* 101 - 111 mmol/L Final  . CO2 05/29/2014 27  22 - 32 mmol/L Final  . Glucose, Bld 05/29/2014 150* 65 - 99 mg/dL Final  . BUN 05/29/2014 10  6 - 20 mg/dL Final  . Creatinine, Ser 05/29/2014 0.71  0.44 - 1.00 mg/dL Final  . Calcium 05/29/2014 7.9* 8.9 - 10.3 mg/dL Final  . GFR calc non Af Amer 05/29/2014 >60  >60 mL/min Final  . GFR calc Af Amer 05/29/2014 >60  >60 mL/min Final   Comment: (NOTE) The eGFR has been calculated using the CKD EPI equation. This calculation has not been validated in all clinical situations. eGFR's persistently <60 mL/min signify possible Chronic Kidney Disease.   Georgiann Hahn gap 05/29/2014  6  5 - 15 Final  . Order Confirmation 05/29/2014 ORDER PROCESSED BY BLOOD BANK   Final  . Sodium 05/29/2014 120* 135 - 145 mmol/L Final  . Potassium 05/29/2014 4.5   3.5 - 5.1 mmol/L Final  . Chloride 05/29/2014 86* 101 - 111 mmol/L Final  . CO2 05/29/2014 26  22 - 32 mmol/L Final  . Glucose, Bld 05/29/2014 149* 65 - 99 mg/dL Final  . BUN 05/29/2014 8  6 - 20 mg/dL Final  . Creatinine, Ser 05/29/2014 0.55  0.44 - 1.00 mg/dL Final  . Calcium 05/29/2014 8.0* 8.9 - 10.3 mg/dL Final  . GFR calc non Af Amer 05/29/2014 >60  >60 mL/min Final  . GFR calc Af Amer 05/29/2014 >60  >60 mL/min Final   Comment: (NOTE) The eGFR has been calculated using the CKD EPI equation. This calculation has not been validated in all clinical situations. eGFR's persistently <60 mL/min signify possible Chronic Kidney Disease.   . Anion gap 05/29/2014 8  5 - 15 Final  . WBC 05/30/2014 11.6* 4.0 - 10.5 K/uL Final  . RBC 05/30/2014 3.56* 3.87 - 5.11 MIL/uL Final  . Hemoglobin 05/30/2014 10.1* 12.0 - 15.0 g/dL Final   POST TRANSFUSION SPECIMEN  . HCT 05/30/2014 30.1* 36.0 - 46.0 % Final  . MCV 05/30/2014 84.6  78.0 - 100.0 fL Final  . MCH 05/30/2014 28.4  26.0 - 34.0 pg Final  . MCHC 05/30/2014 33.6  30.0 - 36.0 g/dL Final  . RDW 05/30/2014 15.8* 11.5 - 15.5 % Final  . Platelets 05/30/2014 154  150 - 400 K/uL Final  . Osmolality 05/30/2014 258* 275 - 300 mOsm/kg Final   Performed at [facility]  . Osmolality, Ur 05/30/2014 166* 390 - 1090 mOsm/kg Final   Performed at [facility]  . Sodium, Ur 05/30/2014 <10   Final  . Color, Urine 05/30/2014 AMBER* YELLOW Final   BIOCHEMICALS MAY BE AFFECTED BY COLOR  . APPearance 05/30/2014 CLOUDY* CLEAR Final  . Specific Gravity, Urine 05/30/2014 1.007  1.005 - 1.030 Final  . pH 05/30/2014 6.5  5.0 - 8.0 Final  . Glucose, UA 05/30/2014 NEGATIVE  NEGATIVE mg/dL Final  . Hgb urine dipstick 05/30/2014 NEGATIVE  NEGATIVE Final  . Bilirubin Urine 05/30/2014 NEGATIVE  NEGATIVE Final  . Ketones, ur 05/30/2014 NEGATIVE  NEGATIVE mg/dL Final  . Protein, ur 05/30/2014 NEGATIVE  NEGATIVE mg/dL Final  . Urobilinogen, UA  05/30/2014 0.2  0.0 - 1.0 mg/dL Final  . Nitrite 05/30/2014 NEGATIVE  NEGATIVE Final  . Leukocytes, UA 05/30/2014 MODERATE* NEGATIVE Final  . TSH 05/30/2014 5.349* 0.350 - 4.500 uIU/mL Final  . Cortisol, Plasma 05/30/2014 26.1   Final   Comment: (NOTE) AM    6.7 - 22.6 ug/dL PM   <10.0       ug/dL   . Sodium 05/30/2014 123* 135 - 145 mmol/L Final  . Sodium 05/30/2014 123* 135 - 145 mmol/L Final  . Sodium 05/30/2014 123* 135 - 145 mmol/L Final  . Squamous Epithelial / LPF 05/30/2014 RARE  RARE Final  . WBC, UA 05/30/2014 11-20  <3 WBC/hpf Final  . Bacteria, UA 05/30/2014 MANY* RARE Final  . Sodium 05/31/2014 127* 135 - 145 mmol/L Final  . WBC 05/31/2014 10.1  4.0 - 10.5 K/uL Final  . RBC 05/31/2014 3.54* 3.87 - 5.11 MIL/uL Final  . Hemoglobin 05/31/2014 10.1* 12.0 - 15.0 g/dL Final  . HCT 05/31/2014 30.1* 36.0 - 46.0 % Final  . MCV 05/31/2014 85.0  78.0 - 100.0 fL Final  . MCH 05/31/2014 28.5  26.0 - 34.0 pg Final  . MCHC 05/31/2014 33.6  30.0 - 36.0 g/dL Final  . RDW 05/31/2014 15.6* 11.5 - 15.5 % Final  . Platelets 05/31/2014 162  150 - 400 K/uL Final  . Sodium 05/31/2014 127* 135 - 145 mmol/L Final  . Potassium 05/31/2014 4.4  3.5 - 5.1 mmol/L Final  . Chloride 05/31/2014 94* 101 - 111 mmol/L Final  . CO2 05/31/2014 27  22 - 32 mmol/L Final  . Glucose, Bld 05/31/2014 151* 65 - 99 mg/dL Final  . BUN 05/31/2014 7  6 - 20 mg/dL Final  . Creatinine, Ser 05/31/2014 0.52  0.44 - 1.00 mg/dL Final  . Calcium 05/31/2014 8.0* 8.9 - 10.3 mg/dL Final  . GFR calc non Af Amer 05/31/2014 >60  >60 mL/min Final  . GFR calc Af Amer 05/31/2014 >60  >60 mL/min Final   Comment: (NOTE) The eGFR has been calculated using the CKD EPI equation. This calculation has not been validated in all clinical situations. eGFR's persistently <60 mL/min signify possible Chronic Kidney Disease.   . Anion gap 05/31/2014 6  5 - 15 Final  . Sodium 05/31/2014 128* 135 - 145 mmol/L Final  . Sodium 05/31/2014 128*  135 - 145 mmol/L Final  . Sodium 05/31/2014 128* 135 - 145 mmol/L Final  . Sodium 05/31/2014 127* 135 - 145 mmol/L Final  . Sodium 06/01/2014 130* 135 - 145 mmol/L Final  . Sodium 06/01/2014 131* 135 - 145 mmol/L Final  . Potassium 06/01/2014 4.3  3.5 - 5.1 mmol/L Final  . Chloride 06/01/2014 97* 101 - 111 mmol/L Final  . CO2 06/01/2014 30  22 - 32 mmol/L Final  . Glucose, Bld 06/01/2014 118* 65 - 99 mg/dL Final  . BUN 06/01/2014 <5* 6 - 20 mg/dL Final   REPEATED TO VERIFY  . Creatinine, Ser 06/01/2014 0.49  0.44 - 1.00 mg/dL Final  . Calcium 06/01/2014 8.2* 8.9 - 10.3 mg/dL Final  . GFR calc non Af Amer 06/01/2014 >60  >60 mL/min Final  . GFR calc Af Amer 06/01/2014 >60  >60 mL/min Final   Comment: (NOTE) The eGFR has been calculated using the CKD EPI equation. This calculation has not been validated in all clinical situations. eGFR's persistently <60 mL/min signify possible Chronic Kidney Disease.   . Anion gap 06/01/2014 4* 5 - 15 Final  . Sodium 06/01/2014 129* 135 - 145 mmol/L Final  . Sodium 06/01/2014 127* 135 - 145 mmol/L Final  . Sodium 06/02/2014 129* 135 - 145 mmol/L Final  . Potassium 06/02/2014 4.2  3.5 - 5.1 mmol/L Final  . Chloride 06/02/2014 93* 101 - 111 mmol/L Final  . CO2 06/02/2014 30  22 - 32 mmol/L Final  . Glucose, Bld 06/02/2014 122* 65 - 99 mg/dL Final  . BUN 06/02/2014 6  6 - 20 mg/dL Final  . Creatinine, Ser 06/02/2014 0.58  0.44 - 1.00 mg/dL Final  . Calcium 06/02/2014 8.4* 8.9 - 10.3 mg/dL Final  . GFR calc non Af Amer 06/02/2014 >60  >60 mL/min Final  . GFR calc Af Amer 06/02/2014 >60  >60 mL/min Final   Comment: (NOTE) The eGFR has been calculated using the CKD EPI equation. This calculation has not been validated in all clinical situations. eGFR's persistently <60 mL/min signify possible Chronic Kidney Disease.   . Anion gap 06/02/2014 6  5 - 15 Final  . Sodium 06/02/2014 128* 135 - 145 mmol/L Final  .  Sodium 06/03/2014 129* 135 - 145 mmol/L  Final  . Potassium 06/03/2014 4.2  3.5 - 5.1 mmol/L Final  . Chloride 06/03/2014 95* 101 - 111 mmol/L Final  . CO2 06/03/2014 28  22 - 32 mmol/L Final  . Glucose, Bld 06/03/2014 107* 65 - 99 mg/dL Final  . BUN 06/03/2014 <5* 6 - 20 mg/dL Final  . Creatinine, Ser 06/03/2014 0.48  0.44 - 1.00 mg/dL Final  . Calcium 06/03/2014 8.3* 8.9 - 10.3 mg/dL Final  . GFR calc non Af Amer 06/03/2014 >60  >60 mL/min Final  . GFR calc Af Amer 06/03/2014 >60  >60 mL/min Final   Comment: (NOTE) The eGFR has been calculated using the CKD EPI equation. This calculation has not been validated in all clinical situations. eGFR's persistently <60 mL/min signify possible Chronic Kidney Disease.   . Anion gap 06/03/2014 6  5 - 15 Final  Admission on 05/22/2014, Discharged on 05/22/2014  Component Date Value Ref Range Status  . WBC 05/22/2014 8.3  4.0 - 10.5 K/uL Final  . RBC 05/22/2014 4.39  3.87 - 5.11 MIL/uL Final  . Hemoglobin 05/22/2014 12.8  12.0 - 15.0 g/dL Final  . HCT 05/22/2014 39.5  36.0 - 46.0 % Final  . MCV 05/22/2014 90.0  78.0 - 100.0 fL Final  . MCH 05/22/2014 29.2  26.0 - 34.0 pg Final  . MCHC 05/22/2014 32.4  30.0 - 36.0 g/dL Final  . RDW 05/22/2014 14.2  11.5 - 15.5 % Final  . Platelets 05/22/2014 236  150 - 400 K/uL Final  . Neutrophils Relative % 05/22/2014 74  43 - 77 % Final  . Neutro Abs 05/22/2014 6.2  1.7 - 7.7 K/uL Final  . Lymphocytes Relative 05/22/2014 16  12 - 46 % Final  . Lymphs Abs 05/22/2014 1.3  0.7 - 4.0 K/uL Final  . Monocytes Relative 05/22/2014 8  3 - 12 % Final  . Monocytes Absolute 05/22/2014 0.6  0.1 - 1.0 K/uL Final  . Eosinophils Relative 05/22/2014 2  0 - 5 % Final  . Eosinophils Absolute 05/22/2014 0.1  0.0 - 0.7 K/uL Final  . Basophils Relative 05/22/2014 0  0 - 1 % Final  . Basophils Absolute 05/22/2014 0.0  0.0 - 0.1 K/uL Final  . Sodium 05/22/2014 134* 135 - 145 mmol/L Final  . Potassium 05/22/2014 4.0  3.5 - 5.1 mmol/L Final  . Chloride 05/22/2014 99*  101 - 111 mmol/L Final  . CO2 05/22/2014 27  22 - 32 mmol/L Final  . Glucose, Bld 05/22/2014 124* 70 - 99 mg/dL Final  . BUN 05/22/2014 8  6 - 20 mg/dL Final  . Creatinine, Ser 05/22/2014 0.68  0.44 - 1.00 mg/dL Final  . Calcium 05/22/2014 9.7  8.9 - 10.3 mg/dL Final  . GFR calc non Af Amer 05/22/2014 >60  >60 mL/min Final  . GFR calc Af Amer 05/22/2014 >60  >60 mL/min Final   Comment: (NOTE) The eGFR has been calculated using the CKD EPI equation. This calculation has not been validated in all clinical situations. eGFR's persistently <60 mL/min signify possible Chronic Kidney Disease.   . Anion gap 05/22/2014 8  5 - 15 Final  . Troponin i, poc 05/22/2014 0.00  0.00 - 0.08 ng/mL Final  . Comment 3 05/22/2014          Final   Comment: Due to the release kinetics of cTnI, a negative result within the first hours of the onset of symptoms does not rule out  myocardial infarction with certainty. If myocardial infarction is still suspected, repeat the test at appropriate intervals.   . Color, Urine 05/22/2014 YELLOW  YELLOW Final  . APPearance 05/22/2014 CLEAR  CLEAR Final  . Specific Gravity, Urine 05/22/2014 1.009  1.005 - 1.030 Final  . pH 05/22/2014 7.5  5.0 - 8.0 Final  . Glucose, UA 05/22/2014 NEGATIVE  NEGATIVE mg/dL Final  . Hgb urine dipstick 05/22/2014 NEGATIVE  NEGATIVE Final  . Bilirubin Urine 05/22/2014 NEGATIVE  NEGATIVE Final  . Ketones, ur 05/22/2014 NEGATIVE  NEGATIVE mg/dL Final  . Protein, ur 05/22/2014 NEGATIVE  NEGATIVE mg/dL Final  . Urobilinogen, UA 05/22/2014 0.2  0.0 - 1.0 mg/dL Final  . Nitrite 05/22/2014 NEGATIVE  NEGATIVE Final  . Leukocytes, UA 05/22/2014 SMALL* NEGATIVE Final  . Squamous Epithelial / LPF 05/22/2014 RARE  RARE Final  . WBC, UA 05/22/2014 11-20  <3 WBC/hpf Final  . RBC / HPF 05/22/2014 0-2  <3 RBC/hpf Final  . Bacteria, UA 05/22/2014 FEW* RARE Final  . Urine-Other 05/22/2014 AMORPHOUS URATES/PHOSPHATES   Final  . Specimen Description  05/22/2014 URINE, RANDOM   Final  . Special Requests 05/22/2014 NONE   Final  . Colony Count 05/22/2014    Final                   Value:25,000 COLONIES/ML Performed at [facility]   . Culture 05/22/2014    Final                   Value:Multiple bacterial morphotypes present, none predominant. Suggest appropriate recollection if clinically indicated. Performed at [facility]   . Report Status 05/22/2014 05/23/2014 FINAL   Final  Hospital Outpatient Visit on 05/17/2014  Component Date Value Ref Range Status  . MRSA, PCR 05/17/2014 NEGATIVE  NEGATIVE Final  . Staphylococcus aureus 05/17/2014 NEGATIVE  NEGATIVE Final   Comment:        The Xpert SA Assay (FDA approved for NASAL specimens in patients over 60 years of age), is one component of a comprehensive surveillance program.  Test performance has been validated by [facility] for patients greater than or equal to 21 year old. It is not intended to diagnose infection nor to guide or monitor treatment.   Marland Kitchen aPTT 05/17/2014 28  24 - 37 seconds Final  . Sodium 05/17/2014 134* 135 - 145 mmol/L Final  . Potassium 05/17/2014 3.9  3.5 - 5.1 mmol/L Final  . Chloride 05/17/2014 96* 101 - 111 mmol/L Final  . CO2 05/17/2014 28  22 - 32 mmol/L Final  . Glucose, Bld 05/17/2014 111* 70 - 99 mg/dL Final  . BUN 05/17/2014 15  6 - 20 mg/dL Final  . Creatinine, Ser 05/17/2014 0.65  0.44 - 1.00 mg/dL Final  . Calcium 05/17/2014 10.2  8.9 - 10.3 mg/dL Final  . GFR calc non Af Amer 05/17/2014 >60  >60 mL/min Final  . GFR calc Af Amer 05/17/2014 >60  >60 mL/min Final   Comment: (NOTE) The eGFR has been calculated using the CKD EPI equation. This calculation has not been validated in all clinical situations. eGFR's persistently <60 mL/min signify possible Chronic Kidney Disease.   . Anion gap 05/17/2014 10  5 - 15 Final  . WBC 05/17/2014 9.1  4.0 - 10.5 K/uL Final  . RBC 05/17/2014 4.39  3.87 - 5.11 MIL/uL Final  . Hemoglobin  05/17/2014 12.9  12.0 - 15.0 g/dL Final  . HCT 05/17/2014 39.8  36.0 - 46.0 %  Final  . MCV 05/17/2014 90.7  78.0 - 100.0 fL Final  . MCH 05/17/2014 29.4  26.0 - 34.0 pg Final  . MCHC 05/17/2014 32.4  30.0 - 36.0 g/dL Final  . RDW 05/17/2014 14.6  11.5 - 15.5 % Final  . Platelets 05/17/2014 276  150 - 400 K/uL Final  . Neutrophils Relative % 05/17/2014 56  43 - 77 % Final  . Neutro Abs 05/17/2014 5.1  1.7 - 7.7 K/uL Final  . Lymphocytes Relative 05/17/2014 31  12 - 46 % Final  . Lymphs Abs 05/17/2014 2.8  0.7 - 4.0 K/uL Final  . Monocytes Relative 05/17/2014 9  3 - 12 % Final  . Monocytes Absolute 05/17/2014 0.8  0.1 - 1.0 K/uL Final  . Eosinophils Relative 05/17/2014 4  0 - 5 % Final  . Eosinophils Absolute 05/17/2014 0.4  0.0 - 0.7 K/uL Final  . Basophils Relative 05/17/2014 0  0 - 1 % Final  . Basophils Absolute 05/17/2014 0.0  0.0 - 0.1 K/uL Final  . Prothrombin Time 05/17/2014 12.9  11.6 - 15.2 seconds Final  . INR 05/17/2014 0.96  0.00 - 1.49 Final  . ABO/RH(D) 05/17/2014 A POS   Final  . Antibody Screen 05/17/2014 NEG   Final  . Sample Expiration 05/17/2014 05/31/2014   Final  . Unit Number 05/17/2014 [id number]   Final  . Blood Component Type 05/17/2014 RED CELLS,LR   Final  . Unit division 05/17/2014 00   Final  . Status of Unit 05/17/2014 ISSUED,FINAL   Final  . Transfusion Status 05/17/2014 OK TO TRANSFUSE   Final  . Crossmatch Result 05/17/2014 Compatible   Final  . Unit Number 05/17/2014 [id number]   Final  . Blood Component Type 05/17/2014 RED CELLS,LR   Final  . Unit division 05/17/2014 00   Final  . Status of Unit 05/17/2014 ISSUED,FINAL   Final  . Transfusion Status 05/17/2014 OK TO TRANSFUSE   Final  . Crossmatch Result 05/17/2014 Compatible   Final  . Color, Urine 05/17/2014 YELLOW  YELLOW Final  . APPearance 05/17/2014 CLEAR  CLEAR Final  . Specific Gravity, Urine 05/17/2014 1.009  1.005 - 1.030 Final  . pH 05/17/2014 6.5  5.0 - 8.0 Final  . Glucose, UA  05/17/2014 NEGATIVE  NEGATIVE mg/dL Final  . Hgb urine dipstick 05/17/2014 NEGATIVE  NEGATIVE Final  . Bilirubin Urine 05/17/2014 NEGATIVE  NEGATIVE Final  . Ketones, ur 05/17/2014 NEGATIVE  NEGATIVE mg/dL Final  . Protein, ur 05/17/2014 NEGATIVE  NEGATIVE mg/dL Final  . Urobilinogen, UA 05/17/2014 0.2  0.0 - 1.0 mg/dL Final  . Nitrite 05/17/2014 NEGATIVE  NEGATIVE Final  . Leukocytes, UA 05/17/2014 MODERATE* NEGATIVE Final  . Squamous Epithelial / LPF 05/17/2014 FEW* RARE Final  . WBC, UA 05/17/2014 11-20  <3 WBC/hpf Final  . RBC / HPF 05/17/2014 0-2  <3 RBC/hpf Final  . Bacteria, UA 05/17/2014 FEW* RARE Final     Assessment/Plan  1. Urinary frequency -Cath urinalysis and culture  2. Urinary tract infection without hematuria, site unspecified -Cath urinalysis and culture  3. Hyponatremia Return to normal on lab 07/18/2014  4. Depression Does not appear to be depressed today. Will continue to patient off Effexor. Consider resumption if urine culture is negative and patient continues to notice increased fatigability.

## 2014-07-24 ENCOUNTER — Other Ambulatory Visit: Payer: Self-pay | Admitting: *Deleted

## 2014-07-24 MED ORDER — ZOLPIDEM TARTRATE 5 MG PO TABS: ORAL_TABLET | ORAL | Status: DC

## 2014-07-24 NOTE — Telephone Encounter (Signed)
Fax Order Request from [facility]

## 2014-07-29 DIAGNOSIS — I739 Peripheral vascular disease, unspecified: Secondary | ICD-10-CM | POA: Diagnosis not present

## 2014-07-29 DIAGNOSIS — L603 Nail dystrophy: Secondary | ICD-10-CM | POA: Diagnosis not present

## 2014-07-30 DIAGNOSIS — Z853 Personal history of malignant neoplasm of breast: Secondary | ICD-10-CM | POA: Diagnosis not present

## 2014-07-30 DIAGNOSIS — M810 Age-related osteoporosis without current pathological fracture: Secondary | ICD-10-CM | POA: Diagnosis not present

## 2014-07-30 DIAGNOSIS — Z1231 Encounter for screening mammogram for malignant neoplasm of breast: Secondary | ICD-10-CM | POA: Diagnosis not present

## 2014-07-30 LAB — HM DEXA SCAN

## 2014-08-06 ENCOUNTER — Telehealth: Payer: Self-pay

## 2014-08-06 ENCOUNTER — Encounter: Payer: Self-pay | Admitting: Internal Medicine

## 2014-08-06 DIAGNOSIS — M810 Age-related osteoporosis without current pathological fracture: Secondary | ICD-10-CM

## 2014-08-06 HISTORY — DX: Age-related osteoporosis without current pathological fracture: M81.0

## 2014-08-06 NOTE — Telephone Encounter (Signed)
Spoke with patient about her results of her bone density test she asked for a copy of the report to be mailed to her, copy mailed.

## 2014-08-14 ENCOUNTER — Ambulatory Visit (INDEPENDENT_AMBULATORY_CARE_PROVIDER_SITE_OTHER): Payer: Medicare Other | Admitting: Internal Medicine

## 2014-08-14 ENCOUNTER — Encounter: Payer: Self-pay | Admitting: Internal Medicine

## 2014-08-14 ENCOUNTER — Ambulatory Visit: Payer: Medicare Other | Admitting: Internal Medicine

## 2014-08-14 VITALS — BP 140/82 | HR 70 | Temp 97.5°F | Ht 60.0 in | Wt 129.4 lb

## 2014-08-14 DIAGNOSIS — E871 Hypo-osmolality and hyponatremia: Secondary | ICD-10-CM

## 2014-08-14 DIAGNOSIS — K589 Irritable bowel syndrome without diarrhea: Secondary | ICD-10-CM

## 2014-08-14 DIAGNOSIS — N390 Urinary tract infection, site not specified: Secondary | ICD-10-CM

## 2014-08-14 DIAGNOSIS — R350 Frequency of micturition: Secondary | ICD-10-CM

## 2014-08-14 LAB — POCT URINALYSIS DIPSTICK
Bilirubin, UA: NEGATIVE
Glucose, UA: NEGATIVE
KETONES UA: NEGATIVE
PH UA: 6.5
Protein, UA: 30
Spec Grav, UA: 1.01
UROBILINOGEN UA: NEGATIVE

## 2014-08-14 MED ORDER — CIPROFLOXACIN HCL 500 MG PO TABS: ORAL_TABLET | ORAL | Status: DC

## 2014-08-14 NOTE — Progress Notes (Signed)
Patient ID: Cassandra Glenn, female   DOB: 01/11/1921, 81 y.o.   MRN: [id number]    Facility  [facility]    Place of Service:   OFFICE    Allergies  Allergen Reactions  . Macrodantin [Nitrofurantoin Macrocrystal] Other (See Comments)    Unknown allergic reaction many years ago/ tolerating 50 mg macrodantin   . Septra [Sulfamethoxazole-Trimethoprim] Other (See Comments)    Unknown allergic reaction many years ago    Chief Complaint  Patient presents with  . Acute Visit    Complains of possible UTI, not feeling good since Sunday and urinary frequency    HPI:  Frequency of urination - history recurrent urinary tract infections. Last positive culture was April 2016 with pseudomonas aeruginosa. Urinary frequency is increased over the last few days. She has had some riders and chills. She has been mildly nauseous, but has not vomited. She had diarrhea for 2 loose stools yesterday, but has a history of irritable bowel syndrome  Urinary tract infection without hematuria, site unspecified: Urine was nitrite positive today and there was a moderate amount of blood. Large number of leukocytes was present on dipstick. Culture was submitted.  Hyponatremia: History of hyponatremia. When last checked early July 2016, sodium was 135. She feels like she is dehydrated now.  IBS (irritable bowel syndrome): May be related to the 2 loose stools noted above.    Medications: Patient's Medications  New Prescriptions   No medications on file  Previous Medications   ACETAMINOPHEN (TYLENOL) 500 MG TABLET    Take 1,000 mg by mouth every 6 (six) hours as needed for moderate pain.    ASPIRIN 81 MG CHEWABLE TABLET    Chew by mouth daily.   BILBERRY 1000 MG CAPS    Take 1,000 mg by mouth daily.    CALCIUM CARBONATE-VITAMIN D (CALCIUM 600/VITAMIN D PO)    Take 600 mg by mouth 2 (two) times daily.    CALCIUM POLYCARBOPHIL (FIBERCON PO)    Take 1 tablet by mouth 2 (two) times daily.    CHOLECALCIFEROL (VITAMIN D)  400 UNITS TABS    Take 400 Units by mouth daily.    CRANBERRY EXTRACT PO    Take 1 tablet by mouth daily.   FISH OIL-OMEGA-3 FATTY ACIDS 1000 MG CAPSULE    Take 1 g by mouth daily.    GLUCOSAMINE-CHONDROITIN (GLUCOSAMINE CHONDR COMPLEX PO)    Take 1 tablet by mouth 3 (three) times daily. for joints   MIRABEGRON ER (MYRBETRIQ) 25 MG TB24 TABLET    Take 25 mg by mouth daily.   MULTIPLE VITAMINS-MINERALS (PRESERVISION/LUTEIN) CAPS    Take 1 capsule by mouth 2 (two) times daily.   POLYETHYLENE GLYCOL (MIRALAX / GLYCOLAX) PACKET    Take 17 g by mouth daily.   PROBIOTIC PRODUCT (RESTORA) CAPS    Take 1 capsule by mouth daily.   TIZANIDINE (ZANAFLEX) 2 MG TABLET    Take 1 tablet (2 mg total) by mouth every 8 (eight) hours as needed for muscle spasms.   TRAMADOL (ULTRAM) 50 MG TABLET    Take 2 tablets (100 mg total) by mouth every 6 (six) hours as needed for moderate pain or severe pain.   TRIMETHOPRIM (TRIMPEX) 100 MG TABLET    Take one tablet at bedtime   ZOLPIDEM (AMBIEN) 5 MG TABLET    Take 1/2 tablet by mouth at bedtime as needed for sleep  Modified Medications   No medications on file  Discontinued Medications   NITROFURANTOIN (MACRODANTIN)  50 MG CAPSULE    Take 50 mg by mouth at bedtime. Prophylaxis course (UTI's) 42 day course 04/26/14     Review of Systems  Constitutional: Positive for activity change and appetite change. Negative for fever, fatigue and unexpected weight change.  HENT: Positive for hearing loss. Negative for ear pain.   Eyes:       History of loss of visual acuity and macular degeneration.  Respiratory:       History of breast discomfort. History of removal of a lump of cancer from the right breast. Dyspnea on exertion.  Cardiovascular: Positive for leg swelling. Negative for chest pain and palpitations.  Gastrointestinal:       Chronic sluggish stools and straining. She frequently complains that her intestines are not working right. There is a previous history of  diverticulosis. Her last colonoscopy was in 2008. She has been seen by Dr. Paulita Fujita in the past. Constipation - she associates with Tramadol  Genitourinary: Positive for frequency, decreased urine volume and difficulty urinating. Negative for dysuria, vaginal bleeding and vaginal discharge.       Complains of a slow, weak urinary stream. Denies dysuria. Has increased frequency and nocturia. Saw Dr. Louis Meckel, urologist. She was taught self-catheterization for a bladder that does not empty well. Vaginal discomfort. Managed by Dr. Claudean Kinds. History of elevated CEA 125. Normal ultrasound of the abdomen and genitourinary tract.  Musculoskeletal:       Chronic back pain.  Patient has a shorter right leg and has a lift in the right shoe. Now seeing Dr. Rhona Raider.  Right hip arthroplasty 5/16 There is generalized stiffness in the joints. There is a history of spinal stenosis. Left third finger catches on extension sometimes. There is a trigger finger. Pain at the left iliac crest and left anterior ribs near the sternum.  Skin:       History of skin discoloration and changes of nails. Denies itching or rash.  Allergic/Immunologic: Negative.   Neurological: Negative for dizziness, tremors, seizures and numbness.       History of spinal stenosis with neurogenic claudication affecting the right leg. Complains of pain in the buttocks that is suggestive of neuralgia from her spinal stenosis.  Hematological: Bruises/bleeds easily.  Psychiatric/Behavioral: Negative.     Filed Vitals:   08/14/14 1140  BP: 140/82  Pulse: 70  Temp: 97.5 F (36.4 C)  TempSrc: Oral  Height: 5' (1.524 m)  Weight: 129 lb 6.4 oz (58.695 kg)   Body mass index is 25.27 kg/(m^2).  Physical Exam  Constitutional: She is oriented to person, place, and time. She appears well-developed and well-nourished. She appears distressed.  HENT:  HOH  Eyes:  Corrective lenses.  Neck: No JVD present. No tracheal deviation present. No  thyromegaly present.  Cardiovascular: Normal rate, regular rhythm and normal heart sounds.  Exam reveals no gallop and no friction rub.   No murmur heard. Pulses in RLE not palpable, has 2+ edema which could be obscuring pulses  Pulmonary/Chest: Effort normal and breath sounds normal. She has no rales.  Abdominal: Soft. Bowel sounds are normal. She exhibits no distension and no mass. There is no tenderness.  Musculoskeletal: Normal range of motion. She exhibits edema (RLE). She exhibits no tenderness.  Poor balance; using walker with PT following right hip replacement  Lymphadenopathy:    She has no cervical adenopathy.  Neurological: She is alert and oriented to person, place, and time. No cranial nerve deficit.  Skin: Skin is warm and dry. There is  pallor.  Old ecchymosis to left face, following fall and blow to right orbit. Wound right hip is cleasn and not inflamed.  Psychiatric: She has a normal mood and affect. Her behavior is normal. Thought content normal.     Labs reviewed: Telephone on 08/06/2014  Component Date Value Ref Range Status  . HM Dexa Scan 07/30/2014 ostoperosis confirmed continue cn#+D supplement will discuss other tx possibiltys at next O.V. in Oct. 2016.   Final  Nursing Home on 07/23/2014  Component Date Value Ref Range Status  . Glucose 07/18/2014 130   Final  . BUN 07/18/2014 13  4 - 21 mg/dL Final  . Creatinine 07/18/2014 0.7  0.5 - 1.1 mg/dL Final  . Potassium 07/18/2014 4.1  3.4 - 5.3 mmol/L Final  . Sodium 07/18/2014 135* 137 - 147 mmol/L Final  Lab on 06/14/2014  Component Date Value Ref Range Status  . Glucose 06/13/2014 99   Final  . BUN 06/13/2014 10  4 - 21 mg/dL Final  . Creatinine 06/13/2014 0.6  0.5 - 1.1 mg/dL Final  . Potassium 06/13/2014 4.5  3.4 - 5.3 mmol/L Final  . Sodium 06/13/2014 135* 137 - 147 mmol/L Final  Lab on 06/07/2014  Component Date Value Ref Range Status  . BUN 06/06/2014 10  4 - 21 mg/dL Final  . Creatinine 06/06/2014  0.6  0.5 - 1.1 mg/dL Final  . Potassium 06/06/2014 4.7  3.4 - 5.3 mmol/L Final  . Sodium 06/06/2014 134* 137 - 147 mmol/L Final  Admission on 05/28/2014, Discharged on 06/03/2014  Component Date Value Ref Range Status  . WBC 05/29/2014 8.8  4.0 - 10.5 K/uL Final  . RBC 05/29/2014 2.60* 3.87 - 5.11 MIL/uL Final  . Hemoglobin 05/29/2014 7.7* 12.0 - 15.0 g/dL Final  . HCT 05/29/2014 23.1* 36.0 - 46.0 % Final  . MCV 05/29/2014 88.8  78.0 - 100.0 fL Final  . MCH 05/29/2014 29.6  26.0 - 34.0 pg Final  . MCHC 05/29/2014 33.3  30.0 - 36.0 g/dL Final  . RDW 05/29/2014 14.4  11.5 - 15.5 % Final  . Platelets 05/29/2014 183  150 - 400 K/uL Final  . Sodium 05/29/2014 125* 135 - 145 mmol/L Final  . Potassium 05/29/2014 4.2  3.5 - 5.1 mmol/L Final  . Chloride 05/29/2014 92* 101 - 111 mmol/L Final  . CO2 05/29/2014 27  22 - 32 mmol/L Final  . Glucose, Bld 05/29/2014 150* 65 - 99 mg/dL Final  . BUN 05/29/2014 10  6 - 20 mg/dL Final  . Creatinine, Ser 05/29/2014 0.71  0.44 - 1.00 mg/dL Final  . Calcium 05/29/2014 7.9* 8.9 - 10.3 mg/dL Final  . GFR calc non Af Amer 05/29/2014 >60  >60 mL/min Final  . GFR calc Af Amer 05/29/2014 >60  >60 mL/min Final   Comment: (NOTE) The eGFR has been calculated using the CKD EPI equation. This calculation has not been validated in all clinical situations. eGFR's persistently <60 mL/min signify possible Chronic Kidney Disease.   . Anion gap 05/29/2014 6  5 - 15 Final  . Order Confirmation 05/29/2014 ORDER PROCESSED BY BLOOD BANK   Final  . Sodium 05/29/2014 120* 135 - 145 mmol/L Final  . Potassium 05/29/2014 4.5  3.5 - 5.1 mmol/L Final  . Chloride 05/29/2014 86* 101 - 111 mmol/L Final  . CO2 05/29/2014 26  22 - 32 mmol/L Final  . Glucose, Bld 05/29/2014 149* 65 - 99 mg/dL Final  . BUN 05/29/2014 8  6 - 20  mg/dL Final  . Creatinine, Ser 05/29/2014 0.55  0.44 - 1.00 mg/dL Final  . Calcium 05/29/2014 8.0* 8.9 - 10.3 mg/dL Final  . GFR calc non Af Amer 05/29/2014  >60  >60 mL/min Final  . GFR calc Af Amer 05/29/2014 >60  >60 mL/min Final   Comment: (NOTE) The eGFR has been calculated using the CKD EPI equation. This calculation has not been validated in all clinical situations. eGFR's persistently <60 mL/min signify possible Chronic Kidney Disease.   . Anion gap 05/29/2014 8  5 - 15 Final  . WBC 05/30/2014 11.6* 4.0 - 10.5 K/uL Final  . RBC 05/30/2014 3.56* 3.87 - 5.11 MIL/uL Final  . Hemoglobin 05/30/2014 10.1* 12.0 - 15.0 g/dL Final   POST TRANSFUSION SPECIMEN  . HCT 05/30/2014 30.1* 36.0 - 46.0 % Final  . MCV 05/30/2014 84.6  78.0 - 100.0 fL Final  . MCH 05/30/2014 28.4  26.0 - 34.0 pg Final  . MCHC 05/30/2014 33.6  30.0 - 36.0 g/dL Final  . RDW 05/30/2014 15.8* 11.5 - 15.5 % Final  . Platelets 05/30/2014 154  150 - 400 K/uL Final  . Osmolality 05/30/2014 258* 275 - 300 mOsm/kg Final   Performed at [facility]  . Osmolality, Ur 05/30/2014 166* 390 - 1090 mOsm/kg Final   Performed at [facility]  . Sodium, Ur 05/30/2014 <10   Final  . Color, Urine 05/30/2014 AMBER* YELLOW Final   BIOCHEMICALS MAY BE AFFECTED BY COLOR  . APPearance 05/30/2014 CLOUDY* CLEAR Final  . Specific Gravity, Urine 05/30/2014 1.007  1.005 - 1.030 Final  . pH 05/30/2014 6.5  5.0 - 8.0 Final  . Glucose, UA 05/30/2014 NEGATIVE  NEGATIVE mg/dL Final  . Hgb urine dipstick 05/30/2014 NEGATIVE  NEGATIVE Final  . Bilirubin Urine 05/30/2014 NEGATIVE  NEGATIVE Final  . Ketones, ur 05/30/2014 NEGATIVE  NEGATIVE mg/dL Final  . Protein, ur 05/30/2014 NEGATIVE  NEGATIVE mg/dL Final  . Urobilinogen, UA 05/30/2014 0.2  0.0 - 1.0 mg/dL Final  . Nitrite 05/30/2014 NEGATIVE  NEGATIVE Final  . Leukocytes, UA 05/30/2014 MODERATE* NEGATIVE Final  . TSH 05/30/2014 5.349* 0.350 - 4.500 uIU/mL Final  . Cortisol, Plasma 05/30/2014 26.1   Final   Comment: (NOTE) AM    6.7 - 22.6 ug/dL PM   <10.0       ug/dL   . Sodium 05/30/2014 123* 135 - 145 mmol/L Final  . Sodium  05/30/2014 123* 135 - 145 mmol/L Final  . Sodium 05/30/2014 123* 135 - 145 mmol/L Final  . Squamous Epithelial / LPF 05/30/2014 RARE  RARE Final  . WBC, UA 05/30/2014 11-20  <3 WBC/hpf Final  . Bacteria, UA 05/30/2014 MANY* RARE Final  . Sodium 05/31/2014 127* 135 - 145 mmol/L Final  . WBC 05/31/2014 10.1  4.0 - 10.5 K/uL Final  . RBC 05/31/2014 3.54* 3.87 - 5.11 MIL/uL Final  . Hemoglobin 05/31/2014 10.1* 12.0 - 15.0 g/dL Final  . HCT 05/31/2014 30.1* 36.0 - 46.0 % Final  . MCV 05/31/2014 85.0  78.0 - 100.0 fL Final  . MCH 05/31/2014 28.5  26.0 - 34.0 pg Final  . MCHC 05/31/2014 33.6  30.0 - 36.0 g/dL Final  . RDW 05/31/2014 15.6* 11.5 - 15.5 % Final  . Platelets 05/31/2014 162  150 - 400 K/uL Final  . Sodium 05/31/2014 127* 135 - 145 mmol/L Final  . Potassium 05/31/2014 4.4  3.5 - 5.1 mmol/L Final  . Chloride 05/31/2014 94* 101 - 111 mmol/L Final  .  CO2 05/31/2014 27  22 - 32 mmol/L Final  . Glucose, Bld 05/31/2014 151* 65 - 99 mg/dL Final  . BUN 05/31/2014 7  6 - 20 mg/dL Final  . Creatinine, Ser 05/31/2014 0.52  0.44 - 1.00 mg/dL Final  . Calcium 05/31/2014 8.0* 8.9 - 10.3 mg/dL Final  . GFR calc non Af Amer 05/31/2014 >60  >60 mL/min Final  . GFR calc Af Amer 05/31/2014 >60  >60 mL/min Final   Comment: (NOTE) The eGFR has been calculated using the CKD EPI equation. This calculation has not been validated in all clinical situations. eGFR's persistently <60 mL/min signify possible Chronic Kidney Disease.   . Anion gap 05/31/2014 6  5 - 15 Final  . Sodium 05/31/2014 128* 135 - 145 mmol/L Final  . Sodium 05/31/2014 128* 135 - 145 mmol/L Final  . Sodium 05/31/2014 128* 135 - 145 mmol/L Final  . Sodium 05/31/2014 127* 135 - 145 mmol/L Final  . Sodium 06/01/2014 130* 135 - 145 mmol/L Final  . Sodium 06/01/2014 131* 135 - 145 mmol/L Final  . Potassium 06/01/2014 4.3  3.5 - 5.1 mmol/L Final  . Chloride 06/01/2014 97* 101 - 111 mmol/L Final  . CO2 06/01/2014 30  22 - 32 mmol/L  Final  . Glucose, Bld 06/01/2014 118* 65 - 99 mg/dL Final  . BUN 06/01/2014 <5* 6 - 20 mg/dL Final   REPEATED TO VERIFY  . Creatinine, Ser 06/01/2014 0.49  0.44 - 1.00 mg/dL Final  . Calcium 06/01/2014 8.2* 8.9 - 10.3 mg/dL Final  . GFR calc non Af Amer 06/01/2014 >60  >60 mL/min Final  . GFR calc Af Amer 06/01/2014 >60  >60 mL/min Final   Comment: (NOTE) The eGFR has been calculated using the CKD EPI equation. This calculation has not been validated in all clinical situations. eGFR's persistently <60 mL/min signify possible Chronic Kidney Disease.   . Anion gap 06/01/2014 4* 5 - 15 Final  . Sodium 06/01/2014 129* 135 - 145 mmol/L Final  . Sodium 06/01/2014 127* 135 - 145 mmol/L Final  . Sodium 06/02/2014 129* 135 - 145 mmol/L Final  . Potassium 06/02/2014 4.2  3.5 - 5.1 mmol/L Final  . Chloride 06/02/2014 93* 101 - 111 mmol/L Final  . CO2 06/02/2014 30  22 - 32 mmol/L Final  . Glucose, Bld 06/02/2014 122* 65 - 99 mg/dL Final  . BUN 06/02/2014 6  6 - 20 mg/dL Final  . Creatinine, Ser 06/02/2014 0.58  0.44 - 1.00 mg/dL Final  . Calcium 06/02/2014 8.4* 8.9 - 10.3 mg/dL Final  . GFR calc non Af Amer 06/02/2014 >60  >60 mL/min Final  . GFR calc Af Amer 06/02/2014 >60  >60 mL/min Final   Comment: (NOTE) The eGFR has been calculated using the CKD EPI equation. This calculation has not been validated in all clinical situations. eGFR's persistently <60 mL/min signify possible Chronic Kidney Disease.   . Anion gap 06/02/2014 6  5 - 15 Final  . Sodium 06/02/2014 128* 135 - 145 mmol/L Final  . Sodium 06/03/2014 129* 135 - 145 mmol/L Final  . Potassium 06/03/2014 4.2  3.5 - 5.1 mmol/L Final  . Chloride 06/03/2014 95* 101 - 111 mmol/L Final  . CO2 06/03/2014 28  22 - 32 mmol/L Final  . Glucose, Bld 06/03/2014 107* 65 - 99 mg/dL Final  . BUN 06/03/2014 <5* 6 - 20 mg/dL Final  . Creatinine, Ser 06/03/2014 0.48  0.44 - 1.00 mg/dL Final  . Calcium 06/03/2014 8.3* 8.9 -  10.3 mg/dL Final  .  GFR calc non Af Amer 06/03/2014 >60  >60 mL/min Final  . GFR calc Af Amer 06/03/2014 >60  >60 mL/min Final   Comment: (NOTE) The eGFR has been calculated using the CKD EPI equation. This calculation has not been validated in all clinical situations. eGFR's persistently <60 mL/min signify possible Chronic Kidney Disease.   . Anion gap 06/03/2014 6  5 - 15 Final  Admission on 05/22/2014, Discharged on 05/22/2014  Component Date Value Ref Range Status  . WBC 05/22/2014 8.3  4.0 - 10.5 K/uL Final  . RBC 05/22/2014 4.39  3.87 - 5.11 MIL/uL Final  . Hemoglobin 05/22/2014 12.8  12.0 - 15.0 g/dL Final  . HCT 05/22/2014 39.5  36.0 - 46.0 % Final  . MCV 05/22/2014 90.0  78.0 - 100.0 fL Final  . MCH 05/22/2014 29.2  26.0 - 34.0 pg Final  . MCHC 05/22/2014 32.4  30.0 - 36.0 g/dL Final  . RDW 05/22/2014 14.2  11.5 - 15.5 % Final  . Platelets 05/22/2014 236  150 - 400 K/uL Final  . Neutrophils Relative % 05/22/2014 74  43 - 77 % Final  . Neutro Abs 05/22/2014 6.2  1.7 - 7.7 K/uL Final  . Lymphocytes Relative 05/22/2014 16  12 - 46 % Final  . Lymphs Abs 05/22/2014 1.3  0.7 - 4.0 K/uL Final  . Monocytes Relative 05/22/2014 8  3 - 12 % Final  . Monocytes Absolute 05/22/2014 0.6  0.1 - 1.0 K/uL Final  . Eosinophils Relative 05/22/2014 2  0 - 5 % Final  . Eosinophils Absolute 05/22/2014 0.1  0.0 - 0.7 K/uL Final  . Basophils Relative 05/22/2014 0  0 - 1 % Final  . Basophils Absolute 05/22/2014 0.0  0.0 - 0.1 K/uL Final  . Sodium 05/22/2014 134* 135 - 145 mmol/L Final  . Potassium 05/22/2014 4.0  3.5 - 5.1 mmol/L Final  . Chloride 05/22/2014 99* 101 - 111 mmol/L Final  . CO2 05/22/2014 27  22 - 32 mmol/L Final  . Glucose, Bld 05/22/2014 124* 70 - 99 mg/dL Final  . BUN 05/22/2014 8  6 - 20 mg/dL Final  . Creatinine, Ser 05/22/2014 0.68  0.44 - 1.00 mg/dL Final  . Calcium 05/22/2014 9.7  8.9 - 10.3 mg/dL Final  . GFR calc non Af Amer 05/22/2014 >60  >60 mL/min Final  . GFR calc Af Amer 05/22/2014  >60  >60 mL/min Final   Comment: (NOTE) The eGFR has been calculated using the CKD EPI equation. This calculation has not been validated in all clinical situations. eGFR's persistently <60 mL/min signify possible Chronic Kidney Disease.   . Anion gap 05/22/2014 8  5 - 15 Final  . Troponin i, poc 05/22/2014 0.00  0.00 - 0.08 ng/mL Final  . Comment 3 05/22/2014          Final   Comment: Due to the release kinetics of cTnI, a negative result within the first hours of the onset of symptoms does not rule out myocardial infarction with certainty. If myocardial infarction is still suspected, repeat the test at appropriate intervals.   . Color, Urine 05/22/2014 YELLOW  YELLOW Final  . APPearance 05/22/2014 CLEAR  CLEAR Final  . Specific Gravity, Urine 05/22/2014 1.009  1.005 - 1.030 Final  . pH 05/22/2014 7.5  5.0 - 8.0 Final  . Glucose, UA 05/22/2014 NEGATIVE  NEGATIVE mg/dL Final  . Hgb urine dipstick 05/22/2014 NEGATIVE  NEGATIVE Final  . Bilirubin Urine  05/22/2014 NEGATIVE  NEGATIVE Final  . Ketones, ur 05/22/2014 NEGATIVE  NEGATIVE mg/dL Final  . Protein, ur 05/22/2014 NEGATIVE  NEGATIVE mg/dL Final  . Urobilinogen, UA 05/22/2014 0.2  0.0 - 1.0 mg/dL Final  . Nitrite 05/22/2014 NEGATIVE  NEGATIVE Final  . Leukocytes, UA 05/22/2014 SMALL* NEGATIVE Final  . Squamous Epithelial / LPF 05/22/2014 RARE  RARE Final  . WBC, UA 05/22/2014 11-20  <3 WBC/hpf Final  . RBC / HPF 05/22/2014 0-2  <3 RBC/hpf Final  . Bacteria, UA 05/22/2014 FEW* RARE Final  . Urine-Other 05/22/2014 AMORPHOUS URATES/PHOSPHATES   Final  . Specimen Description 05/22/2014 URINE, RANDOM   Final  . Special Requests 05/22/2014 NONE   Final  . Colony Count 05/22/2014    Final                   Value:25,000 COLONIES/ML Performed at [facility]   . Culture 05/22/2014    Final                   Value:Multiple bacterial morphotypes present, none predominant. Suggest appropriate recollection if clinically  indicated. Performed at [facility]   . Report Status 05/22/2014 05/23/2014 FINAL   Final  Hospital Outpatient Visit on 05/17/2014  Component Date Value Ref Range Status  . MRSA, PCR 05/17/2014 NEGATIVE  NEGATIVE Final  . Staphylococcus aureus 05/17/2014 NEGATIVE  NEGATIVE Final   Comment:        The Xpert SA Assay (FDA approved for NASAL specimens in patients over 28 years of age), is one component of a comprehensive surveillance program.  Test performance has been validated by [facility] for patients greater than or equal to 76 year old. It is not intended to diagnose infection nor to guide or monitor treatment.   Marland Kitchen aPTT 05/17/2014 28  24 - 37 seconds Final  . Sodium 05/17/2014 134* 135 - 145 mmol/L Final  . Potassium 05/17/2014 3.9  3.5 - 5.1 mmol/L Final  . Chloride 05/17/2014 96* 101 - 111 mmol/L Final  . CO2 05/17/2014 28  22 - 32 mmol/L Final  . Glucose, Bld 05/17/2014 111* 70 - 99 mg/dL Final  . BUN 05/17/2014 15  6 - 20 mg/dL Final  . Creatinine, Ser 05/17/2014 0.65  0.44 - 1.00 mg/dL Final  . Calcium 05/17/2014 10.2  8.9 - 10.3 mg/dL Final  . GFR calc non Af Amer 05/17/2014 >60  >60 mL/min Final  . GFR calc Af Amer 05/17/2014 >60  >60 mL/min Final   Comment: (NOTE) The eGFR has been calculated using the CKD EPI equation. This calculation has not been validated in all clinical situations. eGFR's persistently <60 mL/min signify possible Chronic Kidney Disease.   . Anion gap 05/17/2014 10  5 - 15 Final  . WBC 05/17/2014 9.1  4.0 - 10.5 K/uL Final  . RBC 05/17/2014 4.39  3.87 - 5.11 MIL/uL Final  . Hemoglobin 05/17/2014 12.9  12.0 - 15.0 g/dL Final  . HCT 05/17/2014 39.8  36.0 - 46.0 % Final  . MCV 05/17/2014 90.7  78.0 - 100.0 fL Final  . MCH 05/17/2014 29.4  26.0 - 34.0 pg Final  . MCHC 05/17/2014 32.4  30.0 - 36.0 g/dL Final  . RDW 05/17/2014 14.6  11.5 - 15.5 % Final  . Platelets 05/17/2014 276  150 - 400 K/uL Final  . Neutrophils Relative %  05/17/2014 56  43 - 77 % Final  . Neutro Abs 05/17/2014 5.1  1.7 - 7.7  K/uL Final  . Lymphocytes Relative 05/17/2014 31  12 - 46 % Final  . Lymphs Abs 05/17/2014 2.8  0.7 - 4.0 K/uL Final  . Monocytes Relative 05/17/2014 9  3 - 12 % Final  . Monocytes Absolute 05/17/2014 0.8  0.1 - 1.0 K/uL Final  . Eosinophils Relative 05/17/2014 4  0 - 5 % Final  . Eosinophils Absolute 05/17/2014 0.4  0.0 - 0.7 K/uL Final  . Basophils Relative 05/17/2014 0  0 - 1 % Final  . Basophils Absolute 05/17/2014 0.0  0.0 - 0.1 K/uL Final  . Prothrombin Time 05/17/2014 12.9  11.6 - 15.2 seconds Final  . INR 05/17/2014 0.96  0.00 - 1.49 Final  . ABO/RH(D) 05/17/2014 A POS   Final  . Antibody Screen 05/17/2014 NEG   Final  . Sample Expiration 05/17/2014 05/31/2014   Final  . Unit Number 05/17/2014 [id number]   Final  . Blood Component Type 05/17/2014 RED CELLS,LR   Final  . Unit division 05/17/2014 00   Final  . Status of Unit 05/17/2014 ISSUED,FINAL   Final  . Transfusion Status 05/17/2014 OK TO TRANSFUSE   Final  . Crossmatch Result 05/17/2014 Compatible   Final  . Unit Number 05/17/2014 [id number]   Final  . Blood Component Type 05/17/2014 RED CELLS,LR   Final  . Unit division 05/17/2014 00   Final  . Status of Unit 05/17/2014 ISSUED,FINAL   Final  . Transfusion Status 05/17/2014 OK TO TRANSFUSE   Final  . Crossmatch Result 05/17/2014 Compatible   Final  . Color, Urine 05/17/2014 YELLOW  YELLOW Final  . APPearance 05/17/2014 CLEAR  CLEAR Final  . Specific Gravity, Urine 05/17/2014 1.009  1.005 - 1.030 Final  . pH 05/17/2014 6.5  5.0 - 8.0 Final  . Glucose, UA 05/17/2014 NEGATIVE  NEGATIVE mg/dL Final  . Hgb urine dipstick 05/17/2014 NEGATIVE  NEGATIVE Final  . Bilirubin Urine 05/17/2014 NEGATIVE  NEGATIVE Final  . Ketones, ur 05/17/2014 NEGATIVE  NEGATIVE mg/dL Final  . Protein, ur 05/17/2014 NEGATIVE  NEGATIVE mg/dL Final  . Urobilinogen, UA 05/17/2014 0.2  0.0 - 1.0 mg/dL Final  . Nitrite  05/17/2014 NEGATIVE  NEGATIVE Final  . Leukocytes, UA 05/17/2014 MODERATE* NEGATIVE Final  . Squamous Epithelial / LPF 05/17/2014 FEW* RARE Final  . WBC, UA 05/17/2014 11-20  <3 WBC/hpf Final  . RBC / HPF 05/17/2014 0-2  <3 RBC/hpf Final  . Bacteria, UA 05/17/2014 FEW* RARE Final     Assessment/Plan 1. Frequency of urination - POC Urinalysis Dipstick - Culture, Urine  2. Urinary tract infection without hematuria, site unspecified - ciprofloxacin (CIPRO) 500 MG tablet; One twice daily for infection  Dispense: 20 tablet; Refill: 0 - Urine culture  3. Hyponatremia - Basic metabolic panel  4. IBS (irritable bowel syndrome) observe

## 2014-08-15 LAB — BASIC METABOLIC PANEL
BUN/Creatinine Ratio: 10 — ABNORMAL LOW (ref 11–26)
BUN: 6 mg/dL — ABNORMAL LOW (ref 10–36)
CHLORIDE: 87 mmol/L — AB (ref 97–108)
CO2: 24 mmol/L (ref 18–29)
Calcium: 9 mg/dL (ref 8.7–10.3)
Creatinine, Ser: 0.6 mg/dL (ref 0.57–1.00)
GFR calc non Af Amer: 80 mL/min/{1.73_m2} (ref >59)
GFR, EST AFRICAN AMERICAN: 92 mL/min/{1.73_m2} (ref >59)
Glucose: 113 mg/dL — ABNORMAL HIGH (ref 65–99)
Potassium: 4.3 mmol/L (ref 3.5–5.2)
SODIUM: 126 mmol/L — AB (ref 134–144)

## 2014-08-16 LAB — URINE CULTURE

## 2014-08-19 DIAGNOSIS — Z96641 Presence of right artificial hip joint: Secondary | ICD-10-CM | POA: Diagnosis not present

## 2014-08-22 DIAGNOSIS — H3562 Retinal hemorrhage, left eye: Secondary | ICD-10-CM | POA: Diagnosis not present

## 2014-08-22 DIAGNOSIS — M5441 Lumbago with sciatica, right side: Secondary | ICD-10-CM | POA: Diagnosis not present

## 2014-08-22 DIAGNOSIS — H35373 Puckering of macula, bilateral: Secondary | ICD-10-CM | POA: Diagnosis not present

## 2014-08-22 DIAGNOSIS — H3531 Nonexudative age-related macular degeneration: Secondary | ICD-10-CM | POA: Diagnosis not present

## 2014-08-22 DIAGNOSIS — Z961 Presence of intraocular lens: Secondary | ICD-10-CM | POA: Diagnosis not present

## 2014-08-22 DIAGNOSIS — M6281 Muscle weakness (generalized): Secondary | ICD-10-CM | POA: Diagnosis not present

## 2014-08-22 DIAGNOSIS — H43813 Vitreous degeneration, bilateral: Secondary | ICD-10-CM | POA: Diagnosis not present

## 2014-08-22 DIAGNOSIS — H3532 Exudative age-related macular degeneration: Secondary | ICD-10-CM | POA: Diagnosis not present

## 2014-08-23 ENCOUNTER — Telehealth: Payer: Self-pay

## 2014-08-23 ENCOUNTER — Ambulatory Visit (INDEPENDENT_AMBULATORY_CARE_PROVIDER_SITE_OTHER): Payer: Medicare Other | Admitting: Family Medicine

## 2014-08-23 VITALS — BP 130/70 | HR 84 | Temp 97.8°F | Resp 18 | Ht 61.58 in | Wt 132.0 lb

## 2014-08-23 DIAGNOSIS — T83511D Infection and inflammatory reaction due to indwelling urethral catheter, subsequent encounter: Secondary | ICD-10-CM

## 2014-08-23 DIAGNOSIS — R55 Syncope and collapse: Secondary | ICD-10-CM

## 2014-08-23 DIAGNOSIS — N390 Urinary tract infection, site not specified: Secondary | ICD-10-CM | POA: Diagnosis not present

## 2014-08-23 DIAGNOSIS — E871 Hypo-osmolality and hyponatremia: Secondary | ICD-10-CM

## 2014-08-23 DIAGNOSIS — R42 Dizziness and giddiness: Secondary | ICD-10-CM | POA: Diagnosis not present

## 2014-08-23 DIAGNOSIS — R404 Transient alteration of awareness: Secondary | ICD-10-CM | POA: Diagnosis not present

## 2014-08-23 DIAGNOSIS — R531 Weakness: Secondary | ICD-10-CM | POA: Diagnosis not present

## 2014-08-23 DIAGNOSIS — T8351XD Infection and inflammatory reaction due to indwelling urinary catheter, subsequent encounter: Secondary | ICD-10-CM | POA: Diagnosis not present

## 2014-08-23 LAB — POCT CBC
Granulocyte percent: 73.3 %G (ref 37–80)
HCT, POC: 40.4 % (ref 37.7–47.9)
Hemoglobin: 12.7 g/dL (ref 12.2–16.2)
LYMPH, POC: 1.9 (ref 0.6–3.4)
MCH: 27.6 pg (ref 27–31.2)
MCHC: 31.4 g/dL — AB (ref 31.8–35.4)
MCV: 88.1 fL (ref 80–97)
MID (CBC): 0.5 (ref 0–0.9)
MPV: 6.4 fL (ref 0–99.8)
PLATELET COUNT, POC: 278 10*3/uL (ref 142–424)
POC Granulocyte: 6.5 (ref 2–6.9)
POC LYMPH %: 21.2 % (ref 10–50)
POC MID %: 5.5 %M (ref 0–12)
RBC: 4.58 M/uL (ref 4.04–5.48)
RDW, POC: 16.6 %
WBC: 8.8 10*3/uL (ref 4.6–10.2)

## 2014-08-23 LAB — POCT UA - MICROSCOPIC ONLY
BACTERIA, U MICROSCOPIC: NEGATIVE
CASTS, UR, LPF, POC: NEGATIVE
Crystals, Ur, HPF, POC: NEGATIVE
Epithelial cells, urine per micros: NEGATIVE
MUCUS UA: POSITIVE
RBC, URINE, MICROSCOPIC: NEGATIVE
Yeast, UA: NEGATIVE

## 2014-08-23 LAB — POCT URINALYSIS DIPSTICK
Bilirubin, UA: NEGATIVE
Blood, UA: NEGATIVE
Glucose, UA: NEGATIVE
Ketones, UA: NEGATIVE
NITRITE UA: NEGATIVE
PH UA: 6
Protein, UA: NEGATIVE
Spec Grav, UA: 1.02
UROBILINOGEN UA: 0.2

## 2014-08-23 LAB — COMPREHENSIVE METABOLIC PANEL
ALT: 10 U/L (ref 6–29)
AST: 17 U/L (ref 10–35)
Albumin: 4.2 g/dL (ref 3.6–5.1)
Alkaline Phosphatase: 101 U/L (ref 33–130)
BUN: 13 mg/dL (ref 7–25)
CO2: 27 mmol/L (ref 20–31)
CREATININE: 0.7 mg/dL (ref 0.60–0.88)
Calcium: 9.2 mg/dL (ref 8.6–10.4)
Chloride: 94 mmol/L — ABNORMAL LOW (ref 98–110)
Glucose, Bld: 82 mg/dL (ref 65–99)
Potassium: 3.9 mmol/L (ref 3.5–5.3)
Sodium: 132 mmol/L — ABNORMAL LOW (ref 135–146)
Total Bilirubin: 0.4 mg/dL (ref 0.2–1.2)
Total Protein: 6.8 g/dL (ref 6.1–8.1)

## 2014-08-23 LAB — GLUCOSE, POCT (MANUAL RESULT ENTRY): POC Glucose: 85 mg/dl (ref 70–99)

## 2014-08-23 MED ORDER — CIPROFLOXACIN HCL 500 MG PO TABS: 500.0000 mg | ORAL_TABLET | Freq: Two times a day (BID) | ORAL | Status: DC

## 2014-08-23 NOTE — Telephone Encounter (Signed)
Daughter Mrs Renard Hamper called, mother feels faint today and is very upset that they were not told about her low sodium of 126 when they were called about the results. They have been trying to increase her sodium and giving her the Gatorade. Daughter wants her sodium repeated today and doesn't want to wait 2 days before getting results. Explain to her that Dr. Nyoka Cowden is out of the office and I would have to get order from another doctor, if she comes here we won't get results until Monday. She's going to take her to Urgent Care today.

## 2014-08-23 NOTE — Patient Instructions (Signed)
1. Drink two bottles of gatorade daily.

## 2014-08-23 NOTE — Progress Notes (Addendum)
Subjective:  This chart was scribed for Reginia Forts, MD by Thea Alken, ED Scribe. This patient was seen in room 10 and the patient's care was started at 2:56 PM.   Patient ID: Cassandra Glenn, female    DOB: 12/10/1920, 81 y.o.   MRN: [id number]  HPI   Chief Complaint  Patient presents with  . Urinary Tract Infection    x 1 week  . Dizziness    x today  . Urinary Frequency   HPI Comments: Cassandra Glenn is a 81 y.o. female who [facility] the [facility] complaining of urinary frequency.  She was seen 8/3 by pcp Dr Nyoka Cowden and was prescribed cipro. Her urine had positive nitrates.  Pt last dose of cipro tonight. She self caths at night.  Hx of hyponatremia, Sodium was 126 on 8/3. Pt has been drinking Gatorade and eating salty snacks.   Pt states this morning she was sitting on the edge of the bed when she felt as if she was going to faint x 5 hours ago. She developed chills, hot flashes, room spinning dizziness with some blackening vision and her head felt heavy, not a HA. She denies syncope. She went to get a glass  of water and sat on her recliner. She was evaluated by EMS and had an EKG an was advised to been seen. She feels fine as of now, but pt's daughter reports pt will have occasional waves of dizziness/malaise. She had a normal BM this morning. She denies fevers, visual disturbance, CP, palpitation, cough, SOB, nausea, emesis, and  diarrhea,  She had CT head in May.  She had hip replacement in May 2016.   Past Medical History  Diagnosis Date  . Macular degeneration   . HOH (hard of hearing)   . Vaginitis and vulvovaginitis 02/29/2012  . Abnormalities of the hair 02/29/2012  . Spinal stenosis, lumbar region, with neurogenic claudication 11/02/2011  . Trigger finger (acquired) 11/02/2011  . Right bundle branch block 08/30/2011  . Edema 05/07/2011  . Contact dermatitis and other eczema, due to unspecified cause 05/07/2011  . Hypopotassemia 03/26/2011  .  Candidiasis of skin and nails 03/19/2011  . Hyposmolality and/or hyponatremia 03/19/2011  . Diverticulosis of colon (without mention of hemorrhage) 03/19/2011  . Unspecified constipation 03/19/2011  . Pain in joint, pelvic region and thigh 03/19/2011  . Lumbago 03/19/2011  . Spasm of muscle 03/19/2011  . Osteoporosis, unspecified 03/19/2011  . Insomnia, unspecified 03/19/2011  . Other malaise and fatigue 03/19/2011  . Impaired fasting glucose 03/19/2011  . Hypotension, unspecified 03/18/2011  . Closed fracture of base of neck of femur 03/18/2011  . Arthritis   . Shortness of breath   . Kidney infection   . Cancer   . Malignant neoplasm of breast (female), unspecified site 10/28/2002  . Depression 07/23/2014   Prior to Admission medications   Medication Sig Start Date End Date Taking? Authorizing Provider  acetaminophen (TYLENOL) 500 MG tablet Take 1,000 mg by mouth every 6 (six) hours as needed for moderate pain.    Yes Historical Provider, MD  aspirin 81 MG chewable tablet Chew by mouth daily.   Yes Historical Provider, MD  Bilberry 1000 MG CAPS Take 1,000 mg by mouth daily.    Yes Historical Provider, MD  Calcium Carbonate-Vitamin D (CALCIUM 600/VITAMIN D PO) Take 600 mg by mouth 2 (two) times daily.    Yes Historical Provider, MD  Calcium Polycarbophil (FIBERCON PO) Take 1 tablet by  mouth 2 (two) times daily.    Yes Historical Provider, MD  cholecalciferol (VITAMIN D) 400 UNITS TABS Take 400 Units by mouth daily.    Yes Historical Provider, MD  ciprofloxacin (CIPRO) 500 MG tablet One twice daily for infection 08/14/14  Yes Estill Dooms, MD  CRANBERRY EXTRACT PO Take 1 tablet by mouth daily.   Yes Historical Provider, MD  fish oil-omega-3 fatty acids 1000 MG capsule Take 1 g by mouth daily.    Yes Historical Provider, MD  Glucosamine-Chondroitin (GLUCOSAMINE CHONDR COMPLEX PO) Take 1 tablet by mouth 3 (three) times daily. for joints   Yes Historical Provider, MD  mirabegron ER  (MYRBETRIQ) 25 MG TB24 tablet Take 25 mg by mouth daily.   Yes Historical Provider, MD  Multiple Vitamins-Minerals (PRESERVISION/LUTEIN) CAPS Take 1 capsule by mouth 2 (two) times daily.   Yes Historical Provider, MD  polyethylene glycol (MIRALAX / GLYCOLAX) packet Take 17 g by mouth daily.   Yes Historical Provider, MD  Probiotic Product (RESTORA) CAPS Take 1 capsule by mouth daily.   Yes Historical Provider, MD  traMADol (ULTRAM) 50 MG tablet Take 2 tablets (100 mg total) by mouth every 6 (six) hours as needed for moderate pain or severe pain. 07/18/14  Yes Lauree Chandler, NP  trimethoprim (TRIMPEX) 100 MG tablet Take one tablet at bedtime 07/02/14  Yes Historical Provider, MD  zolpidem (AMBIEN) 5 MG tablet Take 1/2 tablet by mouth at bedtime as needed for sleep 07/24/14  Yes Gildardo Cranker, DO  tiZANidine (ZANAFLEX) 2 MG tablet Take 1 tablet (2 mg total) by mouth every 8 (eight) hours as needed for muscle spasms. Patient not taking: Reported on 08/23/2014 05/31/14   Loni Dolly, PA-C   Social History   Social History  . Marital Status: Widowed    Spouse Name: N/A  . Number of Children: N/A  . Years of Education: N/A   Occupational History  . homemaker    Social History Main Topics  . Smoking status: Never Smoker   . Smokeless tobacco: Never Used  . Alcohol Use: 0.0 oz/week    0 Glasses of wine per week     Comment: occasional glass  . Drug Use: No  . Sexual Activity: No   Other Topics Concern  . Not on file   Social History Narrative   Lives at [facility]   Widowed   Environmental consultant   Exercise none   POA/Living Will        Review of Systems  Constitutional: Positive for chills. Negative for fever, diaphoresis and fatigue.  HENT: Negative for congestion, ear pain, postnasal drip, rhinorrhea and sore throat.   Eyes: Negative for visual disturbance.  Respiratory: Negative for cough and shortness of breath.   Cardiovascular: Negative for chest pain, palpitations and leg  swelling.  Gastrointestinal: Negative for nausea, vomiting, abdominal pain, diarrhea and constipation.  Endocrine: Negative for cold intolerance, heat intolerance, polydipsia, polyphagia and polyuria.  Genitourinary: Negative for dysuria, urgency, frequency, hematuria and flank pain.  Skin: Negative for rash.  Neurological: Positive for dizziness and light-headedness. Negative for tremors, seizures, syncope, facial asymmetry, speech difficulty, weakness, numbness and headaches.  Psychiatric/Behavioral: Negative for confusion.   Objective:   Physical Exam  Constitutional: She is oriented to person, place, and time. She appears well-developed and well-nourished. No distress.  HENT:  Head: Normocephalic and atraumatic.  Right Ear: External ear normal.  Left Ear: External ear normal.  Nose: Nose normal.  Mouth/Throat: Oropharynx is clear and  moist.  Eyes: Conjunctivae and EOM are normal. Pupils are equal, round, and reactive to light.  Neck: Normal range of motion. Neck supple. Carotid bruit is not present. No thyromegaly present.  Cardiovascular: Normal rate, regular rhythm, normal heart sounds and intact distal pulses.  Exam reveals no gallop and no friction rub.   No murmur heard. Pulmonary/Chest: Effort normal and breath sounds normal. No respiratory distress. She has no wheezes. She has no rales. She exhibits no tenderness.  Abdominal: Soft. Bowel sounds are normal. She exhibits no distension and no mass. There is no tenderness. There is no rebound and no guarding.  Musculoskeletal: Normal range of motion.  Lymphadenopathy:    She has no cervical adenopathy.  Neurological: She is alert and oriented to person, place, and time. No cranial nerve deficit. She exhibits normal muscle tone. Coordination normal.  Skin: Skin is warm and dry. No rash noted. She is not diaphoretic. No erythema. No pallor.  Psychiatric: She has a normal mood and affect. Her behavior is normal. Judgment and thought  content normal.  Nursing note and vitals reviewed.  Filed Vitals:   08/23/14 1431  BP: 130/70  Pulse: 84  Temp: 97.8 F (36.6 C)  TempSrc: Oral  Resp: 18  Height: 5' 1.58" (1.564 m)  Weight: 132 lb (59.875 kg)  SpO2: 97%    Assessment & Plan:   1. Near syncope   2. Dizziness   3. Hyponatremia   4. Urinary tract infection associated with catheterization of urinary tract, subsequent encounter     1. Near syncope:  New.  Intermittent episodes for the past three days.  Stable EKG; stable CBC and glucose; stable u/a.  Orthostatics stable as well. Increase fluid intake over the upcoming 24-48 hours. To ED for syncopal event.   2.  Hyponatremia: New on 08/14/2014; will repeat NA level stat today to confirm that low sodium is not etiology to current dizziness and malaise.  Continue hydration with gatorade. 3.  UTI: Stable; urine improved from visit with PCP.  Repeat urine culture; agreeable to extending Cipro therapy while awaiting culture results.     Orders Placed This Encounter  Procedures  . Urine culture  . Comprehensive metabolic panel  . POCT CBC  . POCT glucose (manual entry)  . POCT urinalysis dipstick  . POCT UA - Microscopic Only  . EKG 12-Lead   Meds ordered this encounter  Medications  . DISCONTD: ciprofloxacin (CIPRO) 500 MG tablet    Sig: Take 1 tablet (500 mg total) by mouth 2 (two) times daily.    Dispense:  8 tablet    Refill:  0    I personally performed the services described in this documentation, which was scribed in my presence. The recorded information has been reviewed and considered.  Jaxyn Rout Elayne Guerin, M.D. [facility] [address] [place], [place]  [id number] [phone] phone [phone] fax

## 2014-08-24 ENCOUNTER — Other Ambulatory Visit: Payer: Self-pay | Admitting: *Deleted

## 2014-08-24 ENCOUNTER — Other Ambulatory Visit: Payer: Self-pay | Admitting: Family Medicine

## 2014-08-24 ENCOUNTER — Telehealth: Payer: Self-pay | Admitting: *Deleted

## 2014-08-24 LAB — URINE CULTURE
Colony Count: NO GROWTH
ORGANISM ID, BACTERIA: NO GROWTH

## 2014-08-24 MED ORDER — CIPROFLOXACIN HCL 500 MG PO TABS: 500.0000 mg | ORAL_TABLET | Freq: Two times a day (BID) | ORAL | Status: DC

## 2014-08-24 NOTE — Telephone Encounter (Signed)
I talked with the patient's daughter. She stated that the patient is feeling much better than she did yesterday. She is still having some nausea that comes and goes. She had one dizzy spell today but it wasn't as bad as it was in the past. I informed her to continue to drink Gatorade as discussed in her visit with Dr. Tamala Julian.

## 2014-09-03 DIAGNOSIS — D0430 Carcinoma in situ of skin of unspecified part of face: Secondary | ICD-10-CM | POA: Diagnosis not present

## 2014-09-03 DIAGNOSIS — L820 Inflamed seborrheic keratosis: Secondary | ICD-10-CM | POA: Diagnosis not present

## 2014-09-03 DIAGNOSIS — D0439 Carcinoma in situ of skin of other parts of face: Secondary | ICD-10-CM | POA: Diagnosis not present

## 2014-09-03 DIAGNOSIS — D485 Neoplasm of uncertain behavior of skin: Secondary | ICD-10-CM | POA: Diagnosis not present

## 2014-09-03 DIAGNOSIS — D239 Other benign neoplasm of skin, unspecified: Secondary | ICD-10-CM | POA: Diagnosis not present

## 2014-09-11 DIAGNOSIS — Z029 Encounter for administrative examinations, unspecified: Secondary | ICD-10-CM | POA: Diagnosis not present

## 2014-09-26 DIAGNOSIS — L089 Local infection of the skin and subcutaneous tissue, unspecified: Secondary | ICD-10-CM | POA: Diagnosis not present

## 2014-09-26 DIAGNOSIS — D0430 Carcinoma in situ of skin of unspecified part of face: Secondary | ICD-10-CM | POA: Diagnosis not present

## 2014-09-27 DIAGNOSIS — R339 Retention of urine, unspecified: Secondary | ICD-10-CM | POA: Diagnosis not present

## 2014-09-27 DIAGNOSIS — N390 Urinary tract infection, site not specified: Secondary | ICD-10-CM | POA: Diagnosis not present

## 2014-09-27 DIAGNOSIS — B952 Enterococcus as the cause of diseases classified elsewhere: Secondary | ICD-10-CM | POA: Diagnosis not present

## 2014-09-27 DIAGNOSIS — M545 Low back pain: Secondary | ICD-10-CM | POA: Diagnosis not present

## 2014-09-30 ENCOUNTER — Other Ambulatory Visit: Payer: Self-pay | Admitting: Internal Medicine

## 2014-09-30 ENCOUNTER — Other Ambulatory Visit: Payer: Self-pay | Admitting: *Deleted

## 2014-09-30 MED ORDER — ZOLPIDEM TARTRATE 5 MG PO TABS: ORAL_TABLET | ORAL | Status: DC

## 2014-09-30 NOTE — Telephone Encounter (Signed)
[facility]  

## 2014-10-08 DIAGNOSIS — R339 Retention of urine, unspecified: Secondary | ICD-10-CM | POA: Diagnosis not present

## 2014-10-14 DIAGNOSIS — R350 Frequency of micturition: Secondary | ICD-10-CM | POA: Diagnosis not present

## 2014-10-14 DIAGNOSIS — N390 Urinary tract infection, site not specified: Secondary | ICD-10-CM | POA: Diagnosis not present

## 2014-10-14 DIAGNOSIS — E871 Hypo-osmolality and hyponatremia: Secondary | ICD-10-CM | POA: Diagnosis not present

## 2014-10-14 LAB — BASIC METABOLIC PANEL
BUN: 16 mg/dL (ref 4–21)
Creatinine: 0.8 mg/dL (ref 0.5–1.1)
Glucose: 115 mg/dL
Potassium: 4.2 mmol/L (ref 3.4–5.3)
Sodium: 138 mmol/L (ref 137–147)

## 2014-10-14 LAB — HEPATIC FUNCTION PANEL
ALK PHOS: 99 U/L (ref 25–125)
ALT: 11 U/L (ref 7–35)
AST: 15 U/L (ref 13–35)
BILIRUBIN, TOTAL: 0.5 mg/dL

## 2014-10-15 ENCOUNTER — Encounter: Payer: Self-pay | Admitting: *Deleted

## 2014-10-16 ENCOUNTER — Telehealth: Payer: Self-pay | Admitting: *Deleted

## 2014-10-16 NOTE — Telephone Encounter (Signed)
Spoke with patient's daughter regarding a possible UTI. She stated that her mom was complaining  Of another UTI and she has been giving her AZO. She was wondering if we had received a result on urine that they gave on Monday, I informed her that I would have to check with my fellow employees and I would get back with her soon.

## 2014-10-17 ENCOUNTER — Other Ambulatory Visit: Payer: Self-pay | Admitting: *Deleted

## 2014-10-17 ENCOUNTER — Telehealth: Payer: Self-pay | Admitting: *Deleted

## 2014-10-17 DIAGNOSIS — L603 Nail dystrophy: Secondary | ICD-10-CM | POA: Diagnosis not present

## 2014-10-17 DIAGNOSIS — N390 Urinary tract infection, site not specified: Secondary | ICD-10-CM

## 2014-10-17 DIAGNOSIS — I739 Peripheral vascular disease, unspecified: Secondary | ICD-10-CM | POA: Diagnosis not present

## 2014-10-17 MED ORDER — DOXYCYCLINE HYCLATE 100 MG PO TABS: 100.0000 mg | ORAL_TABLET | Freq: Two times a day (BID) | ORAL | Status: DC

## 2014-10-17 NOTE — Telephone Encounter (Signed)
Spoke with patient daughter regarding UTI, informed her that medication(doxicycline 100 mg po bid x 7 days)  has been sent to the pharmacy.

## 2014-10-17 NOTE — Telephone Encounter (Signed)
Received Urine Culture and given to Janett Billow to review and sign.

## 2014-10-18 ENCOUNTER — Ambulatory Visit: Payer: Medicare Other | Admitting: Internal Medicine

## 2014-10-22 ENCOUNTER — Encounter: Payer: Self-pay | Admitting: Internal Medicine

## 2014-10-22 ENCOUNTER — Non-Acute Institutional Stay: Payer: Medicare Other | Admitting: Internal Medicine

## 2014-10-22 VITALS — BP 120/70 | HR 70 | Temp 97.5°F | Resp 18 | Ht 62.0 in | Wt 134.6 lb

## 2014-10-22 DIAGNOSIS — N390 Urinary tract infection, site not specified: Secondary | ICD-10-CM

## 2014-10-22 DIAGNOSIS — K589 Irritable bowel syndrome without diarrhea: Secondary | ICD-10-CM

## 2014-10-22 DIAGNOSIS — R350 Frequency of micturition: Secondary | ICD-10-CM | POA: Diagnosis not present

## 2014-10-22 DIAGNOSIS — E871 Hypo-osmolality and hyponatremia: Secondary | ICD-10-CM

## 2014-10-22 DIAGNOSIS — F32A Depression, unspecified: Secondary | ICD-10-CM

## 2014-10-22 DIAGNOSIS — R971 Elevated cancer antigen 125 [CA 125]: Secondary | ICD-10-CM | POA: Diagnosis not present

## 2014-10-22 DIAGNOSIS — R739 Hyperglycemia, unspecified: Secondary | ICD-10-CM | POA: Diagnosis not present

## 2014-10-22 DIAGNOSIS — F329 Major depressive disorder, single episode, unspecified: Secondary | ICD-10-CM

## 2014-10-22 MED ORDER — MIRTAZAPINE 15 MG PO TBDP
ORAL_TABLET | ORAL | Status: DC
Start: 2014-10-22 — End: 2015-01-28

## 2014-10-22 NOTE — Progress Notes (Signed)
Patient ID: Cassandra Glenn, female   DOB: [birth year], 81 y.o.   MRN: [id number]    [facility] (12)     No Active Allergies  Chief Complaint  Patient presents with  . Medical Management of Chronic Issues    3 mo f/u,    HPI:  Urinary tract infection without hematuria, site unspecified - patient does self-catheterization due to a poorly contractile bladder. She is now seeing Dr. Louis Meckel, urologist. Patient has had at least 4 infections in the last year. Organisms including enterococcus, Enterobacter, staph coag negative, and pseudomonas aeruginosa. The last infection was with the enterococcus during a period of time that she was on trimethoprim.  IBS (irritable bowel syndrome) - frequent urges for bowel movements along with loose stools. She is taking over-the-counter probiotic, respiratory. She uses fiber supplements for bulk.  Hyponatremia - possibly connected to use of antidepressants in the past (SSRI)  Hyperglycemia - modest elevation in blood sugar on several occasions. Latest was 150 mg percent.  Cancer antigen 125 (CA 125) elevation - patient is being followed by Dr. Claudean Kinds, gynecologist. Next visit scheduled for 12/01/2014.  Urinary frequency - patient had been on Myrbetriq, but this was taken off her drug list by Dr. Louis Meckel recently. She doesn't feel any different off this medication.  Depression - continues to sleep poorly. Daughter believes that she is depressed and has a morbid outlook.    Medications: Patient's Medications  New Prescriptions   No medications on file  Previous Medications   ACETAMINOPHEN (TYLENOL) 500 MG TABLET    Take 1,000 mg by mouth every 6 (six) hours as needed for moderate pain.    ASPIRIN 81 MG CHEWABLE TABLET    Chew by mouth daily.   BILBERRY 1000 MG CAPS    Take 1,000 mg by mouth daily.    CALCIUM CARBONATE-VITAMIN D (CALCIUM 600/VITAMIN D PO)    Take 600 mg by mouth 2 (two) times daily.      CALCIUM POLYCARBOPHIL (FIBERCON PO)    Take 1 tablet by mouth 2 (two) times daily.    CHOLECALCIFEROL (VITAMIN D) 400 UNITS TABS    Take 400 Units by mouth daily.    CIPROFLOXACIN (CIPRO) 500 MG TABLET    One twice daily for infection   CIPROFLOXACIN (CIPRO) 500 MG TABLET    Take 1 tablet (500 mg total) by mouth 2 (two) times daily.   CRANBERRY EXTRACT PO    Take 1 tablet by mouth daily.   DOXYCYCLINE (VIBRA-TABS) 100 MG TABLET    Take 1 tablet (100 mg total) by mouth 2 (two) times daily.   FISH OIL-OMEGA-3 FATTY ACIDS 1000 MG CAPSULE    Take 1 g by mouth daily.    GLUCOSAMINE-CHONDROITIN (GLUCOSAMINE CHONDR COMPLEX PO)    Take 1 tablet by mouth 3 (three) times daily. for joints   MIRABEGRON ER (MYRBETRIQ) 25 MG TB24 TABLET    Take 25 mg by mouth daily.   MULTIPLE VITAMINS-MINERALS (PRESERVISION/LUTEIN) CAPS    Take 1 capsule by mouth 2 (two) times daily.   POLYETHYLENE GLYCOL (MIRALAX / GLYCOLAX) PACKET    Take 17 g by mouth daily.   PROBIOTIC PRODUCT (RESTORA) CAPS    Take 1 capsule by mouth daily.   TIZANIDINE (ZANAFLEX) 2 MG TABLET    Take 1 tablet (2 mg total) by mouth every 8 (eight) hours as needed for muscle spasms.   TRAMADOL (ULTRAM) 50 MG TABLET  Take 2 tablets (100 mg total) by mouth every 6 (six) hours as needed for moderate pain or severe pain.   TRIMETHOPRIM (TRIMPEX) 100 MG TABLET    Take one tablet at bedtime   ZOLPIDEM (AMBIEN) 5 MG TABLET    Take 1/2 tablet by mouth at bedtime as needed for sleep  Modified Medications   No medications on file  Discontinued Medications   No medications on file     Review of Systems  Constitutional: Positive for activity change and appetite change. Negative for fever, fatigue and unexpected weight change.  HENT: Positive for hearing loss. Negative for ear pain.   Eyes:       History of loss of visual acuity and macular degeneration.  Respiratory:       History of breast discomfort. History of removal of a lump of cancer from the  right breast. Dyspnea on exertion.  Cardiovascular: Positive for leg swelling. Negative for chest pain and palpitations.  Gastrointestinal:       She frequently complains that her intestines are not working right. There is a previous history of diverticulosis. Her last colonoscopy was in 2008. She has been seen by Dr. Paulita Fujita in the past. Constipation - she associates with Tramadol  Genitourinary: Positive for frequency, decreased urine volume and difficulty urinating. Negative for dysuria, vaginal bleeding and vaginal discharge.       Complains of a slow, weak urinary stream. Denies dysuria. Has increased frequency and nocturia. Saw Dr. Louis Meckel, urologist. She was taught self-catheterization for a bladder that does not empty well. Vaginal discomfort. Managed by Dr. Claudean Kinds. History of elevated CEA 125. Normal ultrasound of the abdomen and genitourinary tract.  Musculoskeletal:       Chronic back pain.  Patient has a shorter right leg and has a lift in the right shoe. Now seeing Dr. Rhona Raider.  Right hip arthroplasty 5/16 There is generalized stiffness in the joints. There is a history of spinal stenosis. Left third finger catches on extension sometimes. There is a trigger finger. Pain at the left iliac crest and left anterior ribs near the sternum.  Skin:       History of skin discoloration and changes of nails. Denies itching or rash.  Allergic/Immunologic: Negative.   Neurological: Negative for dizziness, tremors, seizures and numbness.       History of spinal stenosis with neurogenic claudication affecting the right leg. Complains of pain in the buttocks that is suggestive of neuralgia from her spinal stenosis.  Hematological: Bruises/bleeds easily.  Psychiatric/Behavioral: Negative.     Filed Vitals:   10/22/14 0913  BP: 120/70  Pulse: 70  Temp: 97.5 F (36.4 C)  TempSrc: Oral  Resp: 18  Height: _0  (1.575 m)  Weight: 134 lb 9.6 oz (61.054 kg)  SpO2: 98%   Body mass index  is 24.61 kg/(m^2).  Physical Exam  Constitutional: She is oriented to person, place, and time. She appears well-developed and well-nourished. No distress.  HENT:  HOH  Eyes:  Corrective lenses.  Neck: No JVD present. No tracheal deviation present. No thyromegaly present.  Cardiovascular: Normal rate, regular rhythm and normal heart sounds.  Exam reveals no gallop and no friction rub.   No murmur heard. Pulses in RLE not palpable, has 2+ edema which could be obscuring pulses  Pulmonary/Chest: Effort normal and breath sounds normal. She has no rales.  Abdominal: Soft. Bowel sounds are normal. She exhibits no distension and no mass. There is no tenderness.  Musculoskeletal: Normal range of  motion. She exhibits edema (RLE). She exhibits no tenderness.  Poor balance; using walker with PT following right hip replacement  Lymphadenopathy:    She has no cervical adenopathy.  Neurological: She is alert and oriented to person, place, and time. No cranial nerve deficit.  Skin: Skin is warm and dry. There is pallor.  Psychiatric: She has a normal mood and affect. Her behavior is normal. Thought content normal.     Labs reviewed: Lab Summary Latest Ref Rng 10/14/2014 08/23/2014 08/14/2014 07/18/2014 06/13/2014  Hemoglobin 12.2 - 16.2 g/dL (None) 12.7 (None) (None) (None)  Hematocrit 37.7 - 47.9 % (None) 40.4 (None) (None) (None)  White count 4.6 - 10.2 K/uL (None) 8.8 (None) (None) (None)  Platelet count - (None) (None) (None) (None) (None)  Sodium 137 - 147 mmol/L 138 132(L) 126(L) 135(A) 135(A)  Potassium 3.4 - 5.3 mmol/L 4.2 3.9 4.3 4.1 4.5  Calcium 8.6 - 10.4 mg/dL (None) 9.2 9.0 (None) (None)  Phosphorus - (None) (None) (None) (None) (None)  Creatinine 0.5 - 1.1 mg/dL 0.8 0.70 0.60 0.7 0.6  AST 13 - 35 U/L 15 17 (None) (None) (None)  Alk Phos 25 - 125 U/L 99 101 (None) (None) (None)  Bilirubin 0.2 - 1.2 mg/dL (None) 0.4 (None) (None) (None)  Glucose - 115 82 113(H) 130 99  Cholesterol - (None)  (None) (None) (None) (None)  HDL cholesterol - (None) (None) (None) (None) (None)  Triglycerides - (None) (None) (None) (None) (None)  LDL Direct - (None) (None) (None) (None) (None)  LDL Calc - (None) (None) (None) (None) (None)  Total protein 6.1 - 8.1 g/dL (None) 6.8 (None) (None) (None)  Albumin 3.6 - 5.1 g/dL (None) 4.2 (None) (None) (None)   Lab Results  Component Value Date   TSH 5.349* 05/30/2014   Lab Results  Component Value Date   BUN 16 10/14/2014   No results found for: HGBA1C     Assessment/Plan 1. Urinary tract infection without hematuria, site unspecified We'll continue to be on the alert for urine infections.  2. IBS (irritable bowel syndrome) Continue current medications  3. Hyponatremia Resolved  4. Hyperglycemia Mild elevation. Does not seem to need additional medical therapy. Advised to avoid sweets.  5. Cancer antigen 125 (CA 125) elevation See Dr. Matthew Saras as scheduled  6. Urinary frequency Unchanged off Myrbetriq  7. Depression - mirtazapine (REMERON SOL-TAB) 15 MG disintegrating tablet; One at bedtime to help rest and for depression  Dispense: 30 tablet; Refill: 5

## 2014-10-23 ENCOUNTER — Telehealth: Payer: Self-pay

## 2014-10-23 NOTE — Telephone Encounter (Signed)
Patient's daughter would like for urinalysis to be faxed to patient urologist Dr.Herrick.  Urinalysis report was in the pending papers to be sent to scanning. Report was removed, faxed to [facility] @ [phone] and then put back to be sent to scanning.

## 2014-10-25 ENCOUNTER — Telehealth: Payer: Self-pay | Admitting: *Deleted

## 2014-10-25 DIAGNOSIS — N390 Urinary tract infection, site not specified: Secondary | ICD-10-CM | POA: Diagnosis not present

## 2014-10-25 NOTE — Telephone Encounter (Signed)
Called and spoke with Cassandra Glenn at [facility] and she will collect Urine specimen for U/A and CX. Patient notified and agreed.  Faxed message/order to [employer] fax#: [phone]

## 2014-10-25 NOTE — Telephone Encounter (Signed)
I think we need to get another urinalysis and culture for we just go on and on with antibiotics. A yeast they vaginal area can cause stinging and burning and this would only be worsened by use of antibiotics. She lives at [facility] [place]. She can either come by the [facility] office, or we can call the clinic nurse at [facility] to see if she can assist in getting the specimen submitted.

## 2014-10-25 NOTE — Telephone Encounter (Signed)
Patient called and stated that she has a UTI and she just finished up the medication, Doxycyline yesterday and she still feels like she still has the infection. She has the stinging and burning still. Would like something called in. Please Advise.

## 2014-10-26 DIAGNOSIS — Z23 Encounter for immunization: Secondary | ICD-10-CM | POA: Diagnosis not present

## 2014-10-28 ENCOUNTER — Telehealth: Payer: Self-pay | Admitting: *Deleted

## 2014-10-28 NOTE — Telephone Encounter (Signed)
Received U/A and Culture from [facility]. Given to Dr. Mariea Clonts to review and sign.

## 2014-10-29 ENCOUNTER — Telehealth: Payer: Self-pay | Admitting: *Deleted

## 2014-10-29 DIAGNOSIS — T83511A Infection and inflammatory reaction due to indwelling urethral catheter, initial encounter: Principal | ICD-10-CM

## 2014-10-29 DIAGNOSIS — N390 Urinary tract infection, site not specified: Secondary | ICD-10-CM

## 2014-10-29 MED ORDER — CIPROFLOXACIN HCL 250 MG PO TABS: 250.0000 mg | ORAL_TABLET | Freq: Two times a day (BID) | ORAL | Status: DC

## 2014-10-29 NOTE — Telephone Encounter (Signed)
Spoke with patient daughter regarding the medications questions that she had earlier this morning. I informed her that Cipro 250 mg po BID x 7 days was sent to the pharmacy. She stated that she would pick it up for her tomorrow and get her started on the antibiotic.

## 2014-10-29 NOTE — Telephone Encounter (Signed)
Spoke with patient daughter regarding her UTI and the medication prescribed for her. She wanted to know if this was the correct medication for the bacteria that is causing her UTI, because they had to change to doxycycline the last time that she had one from the cipro that is being prescribed this time. I informed her that I needed to speak with Dr. Nyoka Cowden, because I was not sure if this was the same

## 2014-11-15 ENCOUNTER — Telehealth: Payer: Self-pay | Admitting: *Deleted

## 2014-11-15 ENCOUNTER — Encounter: Payer: Self-pay | Admitting: Physician Assistant

## 2014-11-15 ENCOUNTER — Ambulatory Visit (INDEPENDENT_AMBULATORY_CARE_PROVIDER_SITE_OTHER): Payer: Medicare Other | Admitting: Physician Assistant

## 2014-11-15 VITALS — BP 128/56 | HR 67 | Temp 97.3°F | Resp 16 | Ht 61.0 in | Wt 137.0 lb

## 2014-11-15 DIAGNOSIS — R350 Frequency of micturition: Secondary | ICD-10-CM | POA: Diagnosis not present

## 2014-11-15 DIAGNOSIS — R339 Retention of urine, unspecified: Secondary | ICD-10-CM | POA: Diagnosis not present

## 2014-11-15 LAB — POCT URINALYSIS DIP (MANUAL ENTRY)
Bilirubin, UA: NEGATIVE
Glucose, UA: NEGATIVE
Ketones, POC UA: NEGATIVE
NITRITE UA: NEGATIVE
PH UA: 5.5
PROTEIN UA: NEGATIVE
RBC UA: NEGATIVE
Spec Grav, UA: <=1.005
Urobilinogen, UA: 0.2

## 2014-11-15 LAB — POC MICROSCOPIC URINALYSIS (UMFC): Mucus: ABSENT

## 2014-11-15 MED ORDER — CIPROFLOXACIN HCL 500 MG PO TABS: 500.0000 mg | ORAL_TABLET | Freq: Two times a day (BID) | ORAL | Status: DC

## 2014-11-15 NOTE — Telephone Encounter (Signed)
Daughter, [name] and patient walked in while i was at lunch and left message that patient's URI has returned. Urgency, burning and diarrhea. Stated that she has completed a round of Cipro on Monday 11/11/14 and has an appointment with Urologist on Tuesday. I called patient back when I returned from lunch and daughter has patient at urgent care being seen.

## 2014-11-15 NOTE — Progress Notes (Signed)
Cassandra Glenn  MRN: [id number] DOB: 23-Jun-1920  Subjective:  Pt presents to clinic with urinary urgency and dysuria.  She has urinary retention and she has to do self in&out cath.  She was treated last week for a UTI by the urologist and she finished up the medication but then within 24h the symptoms started to return.  She has had problems with recurrent UTIs since starting the cath process. She was unable to get an appt with the urologist until next week and she is worried about going that long because she has ended up in the hospital in the past as a result of UTIs.  Pt has used urostat and that helped a little with the pressure but she has not used that since last night.  Finished Cipro 4 days ago - last course was Cipro 250mg  bid x7 days and she states that she typically gets 500mg  bid #10d.  She does not know of any resistance to abx in her past with her urine cultures.  She is on daily trimethprim for prevention of UTIs.  Patient Active Problem List   Diagnosis Date Noted  . Osteoporosis 08/06/2014  . Depression 07/23/2014  . Candidiasis of skin 06/25/2014  . Constipation 06/04/2014  . Acute urinary retention 05/30/2014  . Leukocytosis 05/30/2014  . Postoperative anemia due to acute blood loss 05/29/2014  . Primary osteoarthritis of right hip 05/28/2014  . Cancer antigen 125 (CA 125) elevation 11/27/2013  . IBS (irritable bowel syndrome) 11/27/2013  . Purpura senilis ([place]) 07/31/2013  . TMJ click 49/70/2637  . Insomnia 04/24/2013  . Urinary frequency 04/24/2013  . Hyponatremia 04/19/2013  . Hyperglycemia 04/19/2013  . History of fall 04/17/2013  . UTI (urinary tract infection) 04/16/2013  . Abnormal CT scan, chest 03/23/2013  . Compression fracture of thoracic vertebra (HCC) 11/24/2012  . Closed fracture of base of neck of femur ([place]) 03/18/2011  . Cancer of breast, female ([place]) 10/27/2004    Current Outpatient Prescriptions on File Prior to Visit  Medication Sig  Dispense Refill  . acetaminophen (TYLENOL) 500 MG tablet Take 1,000 mg by mouth every 6 (six) hours as needed for moderate pain.     Marland Kitchen aspirin 81 MG chewable tablet Chew by mouth daily.    . Bilberry 1000 MG CAPS Take 1,000 mg by mouth daily.     . Calcium Carbonate-Vitamin D (CALCIUM 600/VITAMIN D PO) Take 600 mg by mouth 2 (two) times daily.     . Calcium Polycarbophil (FIBERCON PO) Take 1 tablet by mouth 2 (two) times daily.     . cholecalciferol (VITAMIN D) 400 UNITS TABS Take 400 Units by mouth daily.     . ciprofloxacin (CIPRO) 250 MG tablet Take 1 tablet (250 mg total) by mouth 2 (two) times daily. For 7 days. 14 tablet 0  . CRANBERRY EXTRACT PO Take 1 tablet by mouth daily.    . fish oil-omega-3 fatty acids 1000 MG capsule Take 1 g by mouth daily.     . Glucosamine-Chondroitin (GLUCOSAMINE CHONDR COMPLEX PO) Take 1 tablet by mouth 3 (three) times daily. for joints    . mirtazapine (REMERON SOL-TAB) 15 MG disintegrating tablet One at bedtime to help rest and for depression 30 tablet 5  . Multiple Vitamins-Minerals (PRESERVISION/LUTEIN) CAPS Take 1 capsule by mouth 2 (two) times daily.    . polyethylene glycol (MIRALAX / GLYCOLAX) packet Take 17 g by mouth daily.    . Probiotic Product (RESTORA) CAPS Take 1 capsule by mouth  daily.    . tiZANidine (ZANAFLEX) 2 MG tablet Take 1 tablet (2 mg total) by mouth every 8 (eight) hours as needed for muscle spasms. 50 tablet 0  . traMADol (ULTRAM) 50 MG tablet Take 2 tablets (100 mg total) by mouth every 6 (six) hours as needed for moderate pain or severe pain. 240 tablet 0  . trimethoprim (TRIMPEX) 100 MG tablet Take one tablet at bedtime    . zolpidem (AMBIEN) 5 MG tablet Take 1/2 tablet by mouth at bedtime as needed for sleep 15 tablet 3   No current facility-administered medications on file prior to visit.    No Known Allergies  Review of Systems  Constitutional: Negative for fever and chills.  Genitourinary: Positive for dysuria and  urgency.   Objective:  BP 128/56 mmHg  Pulse 67  Temp(Src) 97.3 F (36.3 C) (Oral)  Resp 16  Ht 5\' 1"  (1.549 m)  Wt 137 lb (62.143 kg)  BMI 25.90 kg/m2  SpO2 98%  Physical Exam  Constitutional: She is oriented to person, place, and time and well-developed, well-nourished, and in no distress.  HENT:  Head: Normocephalic and atraumatic.  Right Ear: Hearing and external ear normal.  Left Ear: Hearing and external ear normal.  Eyes: Conjunctivae are normal.  Neck: Normal range of motion.  Pulmonary/Chest: Effort normal.  Neurological: She is alert and oriented to person, place, and time. Gait normal.  Skin: Skin is warm and dry.  Psychiatric: Mood, memory, affect and judgment normal.  Vitals reviewed.  Results for orders placed or performed in visit on 11/15/14  POCT urinalysis dipstick  Result Value Ref Range   Color, UA yellow yellow   Clarity, UA clear clear   Glucose, UA negative negative   Bilirubin, UA negative negative   Ketones, POC UA negative negative   Spec Grav, UA <=1.005    Blood, UA negative negative   pH, UA 5.5    Protein Ur, POC negative negative   Urobilinogen, UA 0.2    Nitrite, UA Negative Negative   Leukocytes, UA Trace (A) Negative  POCT Microscopic Urinalysis ([facility])  Result Value Ref Range   WBC,UR,HPF,POC Few (A) None WBC/hpf   RBC,UR,HPF,POC None None RBC/hpf   Bacteria None None, Too numerous to count   Mucus Absent Absent   Epithelial Cells, UR Per Microscopy Few (A) None, Too numerous to count cells/hpf    Assessment and Plan :  Frequency of urination - Plan: POCT urinalysis dipstick, POCT Microscopic Urinalysis ([facility])  Pts urine is really dilute today - we will send for urine culture.  She will f/u with urologist on Tuesday with her appointment. We will start her on Cipro at a higher dose for a long period of time in thoughts that she did not fully get treated last time since her symptoms returned the day after she finished the abx.  Her  questions were answered.  Windell Hummingbird PA-C  [facility] 11/15/2014 3:18 PM

## 2014-11-16 LAB — URINE CULTURE
Colony Count: NO GROWTH
Organism ID, Bacteria: NO GROWTH

## 2014-11-18 DIAGNOSIS — M25551 Pain in right hip: Secondary | ICD-10-CM | POA: Diagnosis not present

## 2014-11-19 DIAGNOSIS — N3281 Overactive bladder: Secondary | ICD-10-CM | POA: Diagnosis not present

## 2014-11-19 DIAGNOSIS — R338 Other retention of urine: Secondary | ICD-10-CM | POA: Diagnosis not present

## 2014-11-19 DIAGNOSIS — R3129 Other microscopic hematuria: Secondary | ICD-10-CM | POA: Diagnosis not present

## 2014-11-21 DIAGNOSIS — Z853 Personal history of malignant neoplasm of breast: Secondary | ICD-10-CM | POA: Diagnosis not present

## 2014-11-21 DIAGNOSIS — N760 Acute vaginitis: Secondary | ICD-10-CM | POA: Diagnosis not present

## 2014-11-21 DIAGNOSIS — Z1501 Genetic susceptibility to malignant neoplasm of breast: Secondary | ICD-10-CM | POA: Diagnosis not present

## 2014-11-21 DIAGNOSIS — Z6826 Body mass index (BMI) 26.0-26.9, adult: Secondary | ICD-10-CM | POA: Diagnosis not present

## 2014-11-21 DIAGNOSIS — Z01419 Encounter for gynecological examination (general) (routine) without abnormal findings: Secondary | ICD-10-CM | POA: Diagnosis not present

## 2014-11-22 ENCOUNTER — Other Ambulatory Visit: Payer: Self-pay | Admitting: Obstetrics and Gynecology

## 2014-11-22 DIAGNOSIS — R140 Abdominal distension (gaseous): Secondary | ICD-10-CM

## 2014-11-22 DIAGNOSIS — R971 Elevated cancer antigen 125 [CA 125]: Secondary | ICD-10-CM

## 2014-11-27 ENCOUNTER — Ambulatory Visit
Admission: RE | Admit: 2014-11-27 | Discharge: 2014-11-27 | Disposition: A | Discharge status: Home / Self Care | Payer: Medicare Other | Source: Ambulatory Visit | Attending: Obstetrics and Gynecology | Admitting: Obstetrics and Gynecology

## 2014-11-27 DIAGNOSIS — R140 Abdominal distension (gaseous): Secondary | ICD-10-CM

## 2014-11-27 DIAGNOSIS — R971 Elevated cancer antigen 125 [CA 125]: Secondary | ICD-10-CM

## 2014-12-16 DIAGNOSIS — L259 Unspecified contact dermatitis, unspecified cause: Secondary | ICD-10-CM | POA: Diagnosis not present

## 2015-01-01 ENCOUNTER — Ambulatory Visit: Payer: Medicare Other | Admitting: Internal Medicine

## 2015-01-01 ENCOUNTER — Ambulatory Visit (INDEPENDENT_AMBULATORY_CARE_PROVIDER_SITE_OTHER): Payer: Medicare Other | Admitting: Family Medicine

## 2015-01-01 ENCOUNTER — Telehealth: Payer: Self-pay

## 2015-01-01 VITALS — BP 128/72 | HR 85 | Temp 97.7°F | Resp 18 | Ht 61.0 in | Wt 137.0 lb

## 2015-01-01 DIAGNOSIS — J209 Acute bronchitis, unspecified: Secondary | ICD-10-CM

## 2015-01-01 MED ORDER — AZITHROMYCIN 250 MG PO TABS: ORAL_TABLET | ORAL | Status: DC

## 2015-01-01 MED ORDER — BENZONATATE 100 MG PO CAPS: 100.0000 mg | ORAL_CAPSULE | Freq: Three times a day (TID) | ORAL | Status: DC | PRN

## 2015-01-01 NOTE — Progress Notes (Addendum)
Subjective:  This chart was scribed for Delman Cheadle, MD by Moises Blood, Medical Scribe. This patient was seen in Room 2 and the patient's care was started 2:25 PM.   Patient ID: Cassandra Glenn, female    DOB: 07-30-1920, 81 y.o.   MRN: [id number] Chief Complaint  Patient presents with  . Cough    x2-3 days, non-productive cough. Shoulder pain when coughing.    HPI Cassandra Glenn is a 81 y.o. female who presents to [facility] complaining of non productive cough with some congestion that started 3-4 days ago. She feels some shoulder pain when she coughs. She denies shortness of breath, chest pain, fever, chills, and sinus pressure. She denies taking OTC medications for this. She denies history of smoking. She's had history of pleurisy. She denies using cough medications.   She states having h/o chronic UTI's.   Past Medical History  Diagnosis Date  . Macular degeneration   . HOH (hard of hearing)   . Vaginitis and vulvovaginitis 02/29/2012  . Abnormalities of the hair 02/29/2012  . Spinal stenosis, lumbar region, with neurogenic claudication 11/02/2011  . Trigger finger (acquired) 11/02/2011  . Right bundle branch block 08/30/2011  . Edema 05/07/2011  . Contact dermatitis and other eczema, due to unspecified cause 05/07/2011  . Hypopotassemia 03/26/2011  . Candidiasis of skin and nails 03/19/2011  . Hyposmolality and/or hyponatremia 03/19/2011  . Diverticulosis of colon (without mention of hemorrhage) 03/19/2011  . Unspecified constipation 03/19/2011  . Pain in joint, pelvic region and thigh 03/19/2011  . Lumbago 03/19/2011  . Spasm of muscle 03/19/2011  . Osteoporosis, unspecified 03/19/2011  . Insomnia, unspecified 03/19/2011  . Other malaise and fatigue 03/19/2011  . Impaired fasting glucose 03/19/2011  . Hypotension, unspecified 03/18/2011  . Closed fracture of base of neck of femur ([place]) 03/18/2011  . Arthritis   . Shortness of breath   . Kidney infection   . Cancer ([place])     . Malignant neoplasm of breast (female), unspecified site 10/28/2002  . Depression 07/23/2014   Prior to Admission medications   Medication Sig Start Date End Date Taking? Authorizing Provider  acetaminophen (TYLENOL) 500 MG tablet Take 1,000 mg by mouth every 6 (six) hours as needed for moderate pain.    Yes Historical Provider, MD  aspirin 81 MG chewable tablet Chew by mouth daily.   Yes Historical Provider, MD  Bilberry 1000 MG CAPS Take 1,000 mg by mouth daily.    Yes Historical Provider, MD  Calcium Carbonate-Vitamin D (CALCIUM 600/VITAMIN D PO) Take 600 mg by mouth 2 (two) times daily.    Yes Historical Provider, MD  Calcium Polycarbophil (FIBERCON PO) Take 1 tablet by mouth 2 (two) times daily.    Yes Historical Provider, MD  cholecalciferol (VITAMIN D) 400 UNITS TABS Take 400 Units by mouth daily.    Yes Historical Provider, MD  CRANBERRY EXTRACT PO Take 1 tablet by mouth daily.   Yes Historical Provider, MD  fish oil-omega-3 fatty acids 1000 MG capsule Take 1 g by mouth daily.    Yes Historical Provider, MD  Glucosamine-Chondroitin (GLUCOSAMINE CHONDR COMPLEX PO) Take 1 tablet by mouth 3 (three) times daily. for joints   Yes Historical Provider, MD  mirtazapine (REMERON SOL-TAB) 15 MG disintegrating tablet One at bedtime to help rest and for depression 10/22/14  Yes Estill Dooms, MD  Multiple Vitamins-Minerals (PRESERVISION/LUTEIN) CAPS Take 1 capsule by mouth 2 (two) times daily.   Yes Historical Provider, MD  polyethylene glycol (  MIRALAX / GLYCOLAX) packet Take 17 g by mouth daily as needed.    Yes Historical Provider, MD  Probiotic Product (RESTORA) CAPS Take 1 capsule by mouth daily.   Yes Historical Provider, MD  tiZANidine (ZANAFLEX) 2 MG tablet Take 1 tablet (2 mg total) by mouth every 8 (eight) hours as needed for muscle spasms. 05/31/14  Yes Loni Dolly, PA-C  traMADol (ULTRAM) 50 MG tablet Take 2 tablets (100 mg total) by mouth every 6 (six) hours as needed for moderate pain  or severe pain. 07/18/14  Yes Lauree Chandler, NP  trimethoprim (TRIMPEX) 100 MG tablet Take one tablet at bedtime 07/02/14  Yes Historical Provider, MD  zolpidem (AMBIEN) 5 MG tablet Take 1/2 tablet by mouth at bedtime as needed for sleep 09/30/14  Yes Tiffany L Reed, DO  ciprofloxacin (CIPRO) 500 MG tablet Take 1 tablet (500 mg total) by mouth 2 (two) times daily. Patient not taking: Reported on 01/01/2015 11/15/14   Mancel Bale, PA-C   No Known Allergies  Review of Systems  Constitutional: Negative for fever, chills and fatigue.  HENT: Positive for congestion. Negative for sinus pressure.   Respiratory: Positive for cough. Negative for chest tightness, shortness of breath and wheezing.   Cardiovascular: Negative for chest pain.  Gastrointestinal: Negative for nausea, vomiting, diarrhea and constipation.  Musculoskeletal: Positive for myalgias.       Objective:   Physical Exam  Constitutional: She is oriented to person, place, and time. She appears well-developed and well-nourished. No distress.  HENT:  Head: Normocephalic and atraumatic.  Nose: Nose normal.  Mouth/Throat: Oropharynx is clear and moist. No oropharyngeal exudate.  Left ear has a hearing aid, Cerumen bilaterally Nares are patent  Eyes: EOM are normal. Pupils are equal, round, and reactive to light.  Neck: Neck supple.  Cardiovascular: Normal rate, regular rhythm, S1 normal, S2 normal and normal heart sounds.   No murmur heard. Pulmonary/Chest: Effort normal and breath sounds normal. No respiratory distress. She has no decreased breath sounds. She has no wheezes.  Musculoskeletal: Normal range of motion.  Neurological: She is alert and oriented to person, place, and time.  Skin: Skin is warm and dry.  Psychiatric: She has a normal mood and affect. Her behavior is normal.  Nursing note and vitals reviewed.   BP 128/72 mmHg  Pulse 85  Temp(Src) 97.7 F (36.5 C) (Oral)  Resp 18  Ht 5\' 1"  (1.549 m)  Wt 137 lb  (62.143 kg)  BMI 25.90 kg/m2  SpO2 96%     Assessment & Plan:   1. Acute bronchitis, unspecified organism   reassured that exam is normal, suspect viral etiology.   On d3-4 illness today so advised to cont symptomatic care. If not improving in the next 1-2d, then start zpack.    Back pain may be due to pleurisy or flair of MSK pain triggered by cough and scoliosis. Try an aleve bidcc and if effective cont for 5-7d instead of tylenol. If no notable relief after 1-2d then stop   Meds ordered this encounter  Medications  . azithromycin (ZITHROMAX) 250 MG tablet    Sig: Take 2 tabs PO x 1 dose, then 1 tab PO QD x 4 days    Dispense:  6 tablet    Refill:  0  . benzonatate (TESSALON) 100 MG capsule    Sig: Take 1-2 capsules (100-200 mg total) by mouth 3 (three) times daily as needed for cough.    Dispense:  60 capsule  Refill:  0    I personally performed the services described in this documentation, which was scribed in my presence. The recorded information has been reviewed and considered, and addended by me as needed.  Delman Cheadle, MD MPH   By signing my name below, I, Moises Blood, attest that this documentation has been prepared under the direction and in the presence of Delman Cheadle, MD. Electronically Signed: Moises Blood, [place]. 01/01/2015 , 2:25 PM .

## 2015-01-01 NOTE — Telephone Encounter (Signed)
Per Dr.Carter patient will need to be see prior to chest xray order. I called [name] (patient's daughter) and informed her. [name] states she may take patient to Urgent Care if she feels she needs to be seen sooner. [name] will call if we need to cancel 4:00 pm appointment.

## 2015-01-01 NOTE — Patient Instructions (Addendum)
If you are getting worse at all on Thursday/Friday please start the zpack.  Anti-inflammatories can help a lot with pleuritic pain since it is caused by inflammation so starting an aleve twice a day for the back pain with coughing might help A LOT.  Acute Bronchitis Bronchitis is inflammation of the airways that extend from the windpipe into the lungs (bronchi). The inflammation often causes mucus to develop. This leads to a cough, which is the most common symptom of bronchitis.  In acute bronchitis, the condition usually develops suddenly and goes away over time, usually in a couple weeks. Smoking, allergies, and asthma can make bronchitis worse. Repeated episodes of bronchitis may cause further lung problems.  CAUSES Acute bronchitis is most often caused by the same virus that causes a cold. The virus can spread from person to person (contagious) through coughing, sneezing, and touching contaminated objects. SIGNS AND SYMPTOMS   Cough.   Fever.   Coughing up mucus.   Body aches.   Chest congestion.   Chills.   Shortness of breath.   Sore throat.  DIAGNOSIS  Acute bronchitis is usually diagnosed through a physical exam. Your health care provider will also ask you questions about your medical history. Tests, such as chest X-rays, are sometimes done to rule out other conditions.  TREATMENT  Acute bronchitis usually goes away in a couple weeks. Oftentimes, no medical treatment is necessary. Medicines are sometimes given for relief of fever or cough. Antibiotic medicines are usually not needed but may be prescribed in certain situations. In some cases, an inhaler may be recommended to help reduce shortness of breath and control the cough. A cool mist vaporizer may also be used to help thin bronchial secretions and make it easier to clear the chest.  HOME CARE INSTRUCTIONS  Get plenty of rest.   Drink enough fluids to keep your urine clear or pale yellow (unless you have a  medical condition that requires fluid restriction). Increasing fluids may help thin your respiratory secretions (sputum) and reduce chest congestion, and it will prevent dehydration.   Take medicines only as directed by your health care provider.  If you were prescribed an antibiotic medicine, finish it all even if you start to feel better.  Avoid smoking and secondhand smoke. Exposure to cigarette smoke or irritating chemicals will make bronchitis worse. If you are a smoker, consider using nicotine gum or skin patches to help control withdrawal symptoms. Quitting smoking will help your lungs heal faster.   Reduce the chances of another bout of acute bronchitis by washing your hands frequently, avoiding people with cold symptoms, and trying not to touch your hands to your mouth, nose, or eyes.   Keep all follow-up visits as directed by your health care provider.  SEEK MEDICAL CARE IF: Your symptoms do not improve after 1 week of treatment.  SEEK IMMEDIATE MEDICAL CARE IF:  You develop an increased fever or chills.   You have chest pain.   You have severe shortness of breath.  You have bloody sputum.   You develop dehydration.  You faint or repeatedly feel like you are going to pass out.  You develop repeated vomiting.  You develop a severe headache. MAKE SURE YOU:   Understand these instructions.  Will watch your condition.  Will get help right away if you are not doing well or get worse.   This information is not intended to replace advice given to you by your health care provider. Make sure you discuss  any questions you have with your health care provider.   Document Released: 02/05/2004 Document Revised: 01/18/2014 Document Reviewed: 06/20/2012 Elsevier Interactive Patient Education 2016 [place] is an inflammation and swelling of the lining of the lungs (pleura). Because of this inflammation, it hurts to breathe. It can be aggravated by  coughing, laughing, or deep breathing. Pleurisy is often caused by an underlying infection or disease.  HOME CARE INSTRUCTIONS  Monitor your pleurisy for any changes. The following actions may help to alleviate any discomfort you are experiencing:  Medicine may help with pain. Only take over-the-counter or prescription medicines for pain, discomfort, or fever as directed by your health care provider.  Only take antibiotic medicine as directed. Make sure to finish it even if you start to feel better. SEEK MEDICAL CARE IF:   Your pain is not controlled with medicine or is increasing.  You have an increase in pus-like (purulent) secretions brought up with coughing. SEEK IMMEDIATE MEDICAL CARE IF:   You have blue or dark lips, fingernails, or toenails.  You are coughing up blood.  You have increased difficulty breathing.  You have continuing pain unrelieved by medicine or pain lasting more than 1 week.  You have pain that radiates into your neck, arms, or jaw.  You develop increased shortness of breath or wheezing.  You develop a fever, rash, vomiting, fainting, or other serious symptoms. MAKE SURE YOU:  Understand these instructions.   Will watch your condition.   Will get help right away if you are not doing well or get worse.    This information is not intended to replace advice given to you by your health care provider. Make sure you discuss any questions you have with your health care provider.   Document Released: 12/28/2004 Document Revised: 08/30/2012 Document Reviewed: 06/11/2012 Elsevier Interactive Patient Education [employer].

## 2015-01-01 NOTE — Telephone Encounter (Signed)
Patient has a pending appointment today at 4:00 pm for cough and pain in back. Patient's daughter would like to know if you would be willing to order an xray this am (prior to appointment). Please advise

## 2015-01-02 DIAGNOSIS — L603 Nail dystrophy: Secondary | ICD-10-CM | POA: Diagnosis not present

## 2015-01-02 DIAGNOSIS — I739 Peripheral vascular disease, unspecified: Secondary | ICD-10-CM | POA: Diagnosis not present

## 2015-01-03 DIAGNOSIS — H1859 Other hereditary corneal dystrophies: Secondary | ICD-10-CM | POA: Diagnosis not present

## 2015-01-03 DIAGNOSIS — H35371 Puckering of macula, right eye: Secondary | ICD-10-CM | POA: Diagnosis not present

## 2015-01-03 DIAGNOSIS — H353112 Nonexudative age-related macular degeneration, right eye, intermediate dry stage: Secondary | ICD-10-CM | POA: Diagnosis not present

## 2015-01-17 DIAGNOSIS — Z0000 Encounter for general adult medical examination without abnormal findings: Secondary | ICD-10-CM | POA: Diagnosis not present

## 2015-01-17 DIAGNOSIS — N3281 Overactive bladder: Secondary | ICD-10-CM | POA: Diagnosis not present

## 2015-01-17 DIAGNOSIS — Z87440 Personal history of urinary (tract) infections: Secondary | ICD-10-CM | POA: Diagnosis not present

## 2015-01-17 DIAGNOSIS — N390 Urinary tract infection, site not specified: Secondary | ICD-10-CM | POA: Diagnosis not present

## 2015-01-23 DIAGNOSIS — H3562 Retinal hemorrhage, left eye: Secondary | ICD-10-CM | POA: Diagnosis not present

## 2015-01-23 DIAGNOSIS — Z961 Presence of intraocular lens: Secondary | ICD-10-CM | POA: Diagnosis not present

## 2015-01-23 DIAGNOSIS — H31012 Macula scars of posterior pole (postinflammatory) (post-traumatic), left eye: Secondary | ICD-10-CM | POA: Diagnosis not present

## 2015-01-23 DIAGNOSIS — R7301 Impaired fasting glucose: Secondary | ICD-10-CM | POA: Diagnosis not present

## 2015-01-23 DIAGNOSIS — H43813 Vitreous degeneration, bilateral: Secondary | ICD-10-CM | POA: Diagnosis not present

## 2015-01-23 DIAGNOSIS — H353221 Exudative age-related macular degeneration, left eye, with active choroidal neovascularization: Secondary | ICD-10-CM | POA: Diagnosis not present

## 2015-01-23 DIAGNOSIS — H31019 Macula scars of posterior pole (postinflammatory) (post-traumatic), unspecified eye: Secondary | ICD-10-CM | POA: Insufficient documentation

## 2015-01-23 DIAGNOSIS — N390 Urinary tract infection, site not specified: Secondary | ICD-10-CM | POA: Diagnosis not present

## 2015-01-23 DIAGNOSIS — H35373 Puckering of macula, bilateral: Secondary | ICD-10-CM | POA: Diagnosis not present

## 2015-01-23 DIAGNOSIS — H353190 Nonexudative age-related macular degeneration, unspecified eye, stage unspecified: Secondary | ICD-10-CM | POA: Diagnosis not present

## 2015-01-23 HISTORY — DX: Macula scars of posterior pole (postinflammatory) (post-traumatic), unspecified eye: H31.019

## 2015-01-23 LAB — HEMOGLOBIN A1C: HEMOGLOBIN A1C: 5.9

## 2015-01-23 LAB — BASIC METABOLIC PANEL
BUN: 15 mg/dL (ref 4–21)
Creatinine: 0.7 mg/dL (ref <=1.1)
Glucose: 96 mg/dL
Potassium: 4.5 mmol/L (ref 3.4–5.3)
Sodium: 137 mmol/L (ref 137–147)

## 2015-01-24 ENCOUNTER — Encounter: Payer: Self-pay | Admitting: *Deleted

## 2015-01-27 DIAGNOSIS — N390 Urinary tract infection, site not specified: Secondary | ICD-10-CM | POA: Diagnosis not present

## 2015-01-28 ENCOUNTER — Non-Acute Institutional Stay: Payer: Medicare Other | Admitting: Internal Medicine

## 2015-01-28 ENCOUNTER — Encounter: Payer: Self-pay | Admitting: Internal Medicine

## 2015-01-28 VITALS — BP 130/80 | HR 86 | Temp 97.4°F | Resp 20 | Ht 61.0 in | Wt 139.6 lb

## 2015-01-28 DIAGNOSIS — E871 Hypo-osmolality and hyponatremia: Secondary | ICD-10-CM

## 2015-01-28 DIAGNOSIS — R739 Hyperglycemia, unspecified: Secondary | ICD-10-CM

## 2015-01-28 DIAGNOSIS — N390 Urinary tract infection, site not specified: Secondary | ICD-10-CM | POA: Diagnosis not present

## 2015-01-28 DIAGNOSIS — K589 Irritable bowel syndrome without diarrhea: Secondary | ICD-10-CM

## 2015-01-28 DIAGNOSIS — R971 Elevated cancer antigen 125 [CA 125]: Secondary | ICD-10-CM | POA: Diagnosis not present

## 2015-01-28 NOTE — Progress Notes (Signed)
Patient ID: Cassandra Glenn, female   DOB: [birth year], 82 y.o.   MRN: [id number]    [place] of Service: Clinic (12)     No Known Allergies  Chief Complaint  Patient presents with  . Medical Management of Chronic Issues    3 mo f/u- Labs    HPI:  Cancer antigen 125 (CA 125) elevation - patient had follow-up with Dr. Matthew Saras in November 2016. This included ultrasounds of the pelvis and abdomen. There was no explanation found for the elevation in CA-125.  Hyperglycemia improved -   Hyponatremia - resolved   IBS (irritable bowel syndrome) - improved. Rare stools. No constipation.   Urinary tract infection without hematuria, site unspecified - resolved. Has seen urologist, Dr. Louis Meckel. He recommends continuation of twice-daily self-catheterization due to inability of bladder to fully contract and empty.  Patient had bronchitis in late December. It is now resolved.     Medications: Patient's Medications  New Prescriptions   No medications on file  Previous Medications   ACETAMINOPHEN (TYLENOL) 500 MG TABLET    Take 1,000 mg by mouth every 6 (six) hours as needed for moderate pain.    ASPIRIN 81 MG CHEWABLE TABLET    Chew by mouth daily.   BILBERRY 1000 MG CAPS    Take 1,000 mg by mouth daily.    CALCIUM CARBONATE-VITAMIN D (CALCIUM 600/VITAMIN D PO)    Take 600 mg by mouth 2 (two) times daily.    CALCIUM POLYCARBOPHIL (FIBERCON PO)    Take 1 tablet by mouth 2 (two) times daily.    CHOLECALCIFEROL (VITAMIN D) 400 UNITS TABS    Take 400 Units by mouth daily.    CRANBERRY EXTRACT PO    Take 1 tablet by mouth daily.   FISH OIL-OMEGA-3 FATTY ACIDS 1000 MG CAPSULE    Take 1 g by mouth daily.    GLUCOSAMINE-CHONDROITIN (GLUCOSAMINE CHONDR COMPLEX PO)    Take 1 tablet by mouth 3 (three) times daily. for joints   HYDROCODONE-ACETAMINOPHEN (NORCO/VICODIN) 5-325 MG TABLET    Take 1 tablet by mouth every 4 (four) hours as needed for moderate pain.   MIRABEGRON  ER (MYRBETRIQ) 25 MG TB24 TABLET    Take 25 mg by mouth daily.   MULTIPLE VITAMINS-MINERALS (PRESERVISION/LUTEIN) CAPS    Take 1 capsule by mouth 2 (two) times daily.   POLYETHYLENE GLYCOL (MIRALAX / GLYCOLAX) PACKET    Take 17 g by mouth daily as needed.    PROBIOTIC PRODUCT (RESTORA) CAPS    Take 1 capsule by mouth daily.   TIZANIDINE (ZANAFLEX) 2 MG TABLET    Take 1 tablet (2 mg total) by mouth every 8 (eight) hours as needed for muscle spasms.   TRAMADOL (ULTRAM) 50 MG TABLET    Take 2 tablets (100 mg total) by mouth every 6 (six) hours as needed for moderate pain or severe pain.   TRIMETHOPRIM (TRIMPEX) 100 MG TABLET    Take one tablet at bedtime   ZOLPIDEM (AMBIEN) 5 MG TABLET    Take 1/2 tablet by mouth at bedtime as needed for sleep  Modified Medications   No medications on file  Discontinued Medications   AZITHROMYCIN (ZITHROMAX) 250 MG TABLET    Take 2 tabs PO x 1 dose, then 1 tab PO QD x 4 days   BENZONATATE (TESSALON) 100 MG CAPSULE    Take 1-2 capsules (100-200 mg total) by mouth 3 (three) times daily as needed for  cough.   MIRTAZAPINE (REMERON SOL-TAB) 15 MG DISINTEGRATING TABLET    One at bedtime to help rest and for depression     Review of Systems  Constitutional: Positive for activity change and appetite change. Negative for fever, fatigue and unexpected weight change.  HENT: Positive for hearing loss. Negative for ear pain.   Eyes:       History of loss of visual acuity and macular degeneration.  Respiratory:       History of breast discomfort. History of removal of a lump of cancer from the right breast. Dyspnea on exertion.  Cardiovascular: Positive for leg swelling. Negative for chest pain and palpitations.  Gastrointestinal:       She frequently complains that her intestines are not working right. There is a previous history of diverticulosis. Her last colonoscopy was in 2008. She has been seen by Dr. Paulita Fujita in the past. Constipation - she associates with Tramadol    Genitourinary: Positive for frequency, decreased urine volume and difficulty urinating. Negative for dysuria, vaginal bleeding and vaginal discharge.       Complains of a slow, weak urinary stream. Denies dysuria. Has increased frequency and nocturia. Saw Dr. Louis Meckel, urologist. She was taught self-catheterization for a bladder that does not empty well. Vaginal discomfort. Managed by Dr. Claudean Kinds. History of elevated CEA 125. Normal ultrasound of the abdomen and genitourinary tract.  Musculoskeletal:       Chronic back pain.  Patient has a shorter right leg and has a lift in the right shoe. Now seeing Dr. Rhona Raider.  Right hip arthroplasty 5/16 There is generalized stiffness in the joints. There is a history of spinal stenosis. Left third finger catches on extension sometimes. There is a trigger finger. Pain at the left iliac crest and left anterior ribs near the sternum.  Skin:       History of skin discoloration and changes of nails. Denies itching or rash.  Allergic/Immunologic: Negative.   Neurological: Negative for dizziness, tremors, seizures and numbness.       History of spinal stenosis with neurogenic claudication affecting the right leg. Complains of pain in the buttocks that is suggestive of neuralgia from her spinal stenosis.  Hematological: Bruises/bleeds easily.  Psychiatric/Behavioral: Negative.     Filed Vitals:   01/28/15 0909  BP: 130/80  Pulse: 86  Temp: 97.4 F (36.3 C)  TempSrc: Oral  Resp: 20  Height: 5' 1"  (1.549 m)  Weight: 139 lb 9.6 oz (63.322 kg)  SpO2: 98%   Wt Readings from Last 3 Encounters:  01/28/15 139 lb 9.6 oz (63.322 kg)  01/01/15 137 lb (62.143 kg)  11/15/14 137 lb (62.143 kg)    Body mass index is 26.39 kg/(m^2).  Physical Exam  Constitutional: She is oriented to person, place, and time. She appears well-developed and well-nourished. No distress.  HENT:  HOH  Eyes:  Corrective lenses.  Neck: No JVD present. No tracheal deviation  present. No thyromegaly present.  Cardiovascular: Normal rate, regular rhythm and normal heart sounds.  Exam reveals no gallop and no friction rub.   No murmur heard. Pulses in RLE not palpable, has 2+ edema which could be obscuring pulses  Pulmonary/Chest: Effort normal and breath sounds normal. She has no rales.  Abdominal: Soft. Bowel sounds are normal. She exhibits no distension and no mass. There is no tenderness.  Musculoskeletal: Normal range of motion. She exhibits edema (RLE). She exhibits no tenderness.  Poor balance; using walker with PT following right hip replacement  Lymphadenopathy:  She has no cervical adenopathy.  Neurological: She is alert and oriented to person, place, and time. No cranial nerve deficit.  Skin: Skin is warm and dry. There is pallor.  Psychiatric: She has a normal mood and affect. Her behavior is normal. Thought content normal.     Labs reviewed: Lab Summary Latest Ref Rng 01/23/2015 10/14/2014 08/23/2014 08/14/2014 07/18/2014  Hemoglobin 12.2 - 16.2 g/dL (None) (None) 12.7 (None) (None)  Hematocrit 37.7 - 47.9 % (None) (None) 40.4 (None) (None)  White count 4.6 - 10.2 K/uL (None) (None) 8.8 (None) (None)  Platelet count - (None) (None) (None) (None) (None)  Sodium 137 - 147 mmol/L 137 138 132(L) 126(L) 135(A)  Potassium 3.4 - 5.3 mmol/L 4.5 4.2 3.9 4.3 4.1  Calcium 8.6 - 10.4 mg/dL (None) (None) 9.2 9.0 (None)  Phosphorus - (None) (None) (None) (None) (None)  Creatinine .5 - 1.1 mg/dL 0.7 0.8 0.70 0.60 0.7  AST 13 - 35 U/L (None) 15 17 (None) (None)  Alk Phos 25 - 125 U/L (None) 99 101 (None) (None)  Bilirubin 0.2 - 1.2 mg/dL (None) (None) 0.4 (None) (None)  Glucose - 96 115 82 113(H) 130  Cholesterol - (None) (None) (None) (None) (None)  HDL cholesterol - (None) (None) (None) (None) (None)  Triglycerides - (None) (None) (None) (None) (None)  LDL Direct - (None) (None) (None) (None) (None)  LDL Calc - (None) (None) (None) (None) (None)  Total protein  6.1 - 8.1 g/dL (None) (None) 6.8 (None) (None)  Albumin 3.6 - 5.1 g/dL (None) (None) 4.2 (None) (None)   Lab Results  Component Value Date   TSH 5.349* 05/30/2014   Lab Results  Component Value Date   BUN 15 01/23/2015   BUN 16 10/14/2014   BUN 13 08/23/2014   Lab Results  Component Value Date   CREATININE 0.7 01/23/2015   CREATININE 0.8 10/14/2014   CREATININE 0.70 08/23/2014   Lab Results  Component Value Date   HGBA1C 5.9 01/23/2015    US Transvaginal Non-ob  11/27/2014  CLINICAL DATA:  Bloating.  Elevated CA 125. EXAM: TRANSABDOMINAL AND TRANSVAGINAL ULTRASOUND OF PELVIS TECHNIQUE: Both transabdominal and transvaginal ultrasound examinations of the pelvis were performed. Transabdominal technique was performed for global imaging of the pelvis including uterus, ovaries, adnexal regions, and pelvic cul-de-sac. It was necessary to proceed with endovaginal exam following the transabdominal exam to visualize the ovaries. COMPARISON:  11/27/2013 FINDINGS: Uterus Measurements: 6.8 x 2.2 x 3.8 cm. No fibroids or other mass visualized. Endometrium Thickness: 2.1 mm.  No focal abnormality visualized. Right ovary Measurements: 2.3 x 1.1 x 1.0 cm. Normal appearance/no adnexal mass. Left ovary Measurements: Not visualized. No adnexal mass. Other findings No free fluid. IMPRESSION: 1. No acute findings.  No explanation for elevated CA 125. Electronically Signed   By: Kerby Moors M.D.   On: 11/27/2014 14:35   US Pelvis Complete  11/27/2014  CLINICAL DATA:  Bloating.  Elevated CA 125. EXAM: TRANSABDOMINAL AND TRANSVAGINAL ULTRASOUND OF PELVIS TECHNIQUE: Both transabdominal and transvaginal ultrasound examinations of the pelvis were performed. Transabdominal technique was performed for global imaging of the pelvis including uterus, ovaries, adnexal regions, and pelvic cul-de-sac. It was necessary to proceed with endovaginal exam following the transabdominal exam to visualize the ovaries.  COMPARISON:  11/27/2013 FINDINGS: Uterus Measurements: 6.8 x 2.2 x 3.8 cm. No fibroids or other mass visualized. Endometrium Thickness: 2.1 mm.  No focal abnormality visualized. Right ovary Measurements: 2.3 x 1.1 x 1.0 cm. Normal appearance/no adnexal mass. Left ovary  Measurements: Not visualized. No adnexal mass. Other findings No free fluid. IMPRESSION: 1. No acute findings.  No explanation for elevated CA 125. Electronically Signed   By: Kerby Moors M.D.   On: 11/27/2014 14:35     Assessment/Plan  1. Cancer antigen 125 (CA 125) elevation Follow-up Dr. Matthew Saras  2. Hyperglycemia Improved  3. Hyponatremia Resolved  4. IBS (irritable bowel syndrome) Improved none since last seen  5. Urinary tract infection without hematuria, site unspecified None since last seen

## 2015-01-30 ENCOUNTER — Telehealth: Payer: Self-pay

## 2015-01-30 MED ORDER — CIPROFLOXACIN HCL 500 MG PO TABS: 500.0000 mg | ORAL_TABLET | Freq: Two times a day (BID) | ORAL | Status: DC

## 2015-01-30 NOTE — Telephone Encounter (Signed)
Spoke with patient about Urine Culture results. Patient states she is already on a probiotic and she has some Cipro on hand 12 pills (2 additional pills sent to [facility]). Culture report sent to scanning.

## 2015-02-13 ENCOUNTER — Encounter: Payer: Self-pay | Admitting: Internal Medicine

## 2015-02-23 ENCOUNTER — Emergency Department (HOSPITAL_COMMUNITY)
Admission: EM | Admit: 2015-02-23 | Discharge: 2015-02-24 | Disposition: A | Discharge status: Home / Self Care | Payer: Medicare Other | Attending: Emergency Medicine | Admitting: Emergency Medicine

## 2015-02-23 DIAGNOSIS — M1990 Unspecified osteoarthritis, unspecified site: Secondary | ICD-10-CM | POA: Insufficient documentation

## 2015-02-23 DIAGNOSIS — K5900 Constipation, unspecified: Secondary | ICD-10-CM | POA: Insufficient documentation

## 2015-02-23 DIAGNOSIS — R110 Nausea: Secondary | ICD-10-CM | POA: Insufficient documentation

## 2015-02-23 DIAGNOSIS — G4700 Insomnia, unspecified: Secondary | ICD-10-CM | POA: Insufficient documentation

## 2015-02-23 DIAGNOSIS — Z853 Personal history of malignant neoplasm of breast: Secondary | ICD-10-CM | POA: Insufficient documentation

## 2015-02-23 DIAGNOSIS — Z8742 Personal history of other diseases of the female genital tract: Secondary | ICD-10-CM | POA: Diagnosis not present

## 2015-02-23 DIAGNOSIS — Z8639 Personal history of other endocrine, nutritional and metabolic disease: Secondary | ICD-10-CM | POA: Insufficient documentation

## 2015-02-23 DIAGNOSIS — N390 Urinary tract infection, site not specified: Secondary | ICD-10-CM | POA: Diagnosis not present

## 2015-02-23 DIAGNOSIS — Z8619 Personal history of other infectious and parasitic diseases: Secondary | ICD-10-CM | POA: Diagnosis not present

## 2015-02-23 DIAGNOSIS — Z872 Personal history of diseases of the skin and subcutaneous tissue: Secondary | ICD-10-CM | POA: Diagnosis not present

## 2015-02-23 DIAGNOSIS — H9190 Unspecified hearing loss, unspecified ear: Secondary | ICD-10-CM | POA: Diagnosis not present

## 2015-02-23 DIAGNOSIS — Z8679 Personal history of other diseases of the circulatory system: Secondary | ICD-10-CM | POA: Insufficient documentation

## 2015-02-23 DIAGNOSIS — R231 Pallor: Secondary | ICD-10-CM | POA: Diagnosis not present

## 2015-02-23 DIAGNOSIS — Z79899 Other long term (current) drug therapy: Secondary | ICD-10-CM | POA: Diagnosis not present

## 2015-02-23 DIAGNOSIS — M810 Age-related osteoporosis without current pathological fracture: Secondary | ICD-10-CM | POA: Diagnosis not present

## 2015-02-23 DIAGNOSIS — R404 Transient alteration of awareness: Secondary | ICD-10-CM | POA: Diagnosis not present

## 2015-02-23 DIAGNOSIS — R42 Dizziness and giddiness: Secondary | ICD-10-CM | POA: Diagnosis not present

## 2015-02-23 DIAGNOSIS — F329 Major depressive disorder, single episode, unspecified: Secondary | ICD-10-CM | POA: Diagnosis not present

## 2015-02-23 DIAGNOSIS — Z7982 Long term (current) use of aspirin: Secondary | ICD-10-CM | POA: Diagnosis not present

## 2015-02-23 DIAGNOSIS — Z8781 Personal history of (healed) traumatic fracture: Secondary | ICD-10-CM | POA: Insufficient documentation

## 2015-02-23 LAB — CBG MONITORING, ED: Glucose-Capillary: 140 mg/dL — ABNORMAL HIGH (ref 65–99)

## 2015-02-23 NOTE — ED Notes (Signed)
Per [facility], approximately 1700, pt was @ daughter's having dinner & experienced dizziness, when sh went back to her facility they got a b/p of 100/60.  Pt continues having dizziness upon standing.  Pt is a DNR.  Pt is a resident @ [place].

## 2015-02-23 NOTE — ED Notes (Signed)
Bed: [facility] Expected date:  Expected time:  Means of arrival:  Comments: 82 yr old, dizziness

## 2015-02-23 NOTE — ED Notes (Signed)
Pt stated "I had nausea when I got up to get me a Tums around 2100."  Pt confirms dizziness only with standing.

## 2015-02-24 DIAGNOSIS — N390 Urinary tract infection, site not specified: Secondary | ICD-10-CM | POA: Diagnosis not present

## 2015-02-24 LAB — CBC WITH DIFFERENTIAL/PLATELET
BASOS PCT: 0 %
Basophils Absolute: 0 10*3/uL (ref 0.0–0.1)
Eosinophils Absolute: 0.1 10*3/uL (ref 0.0–0.7)
Eosinophils Relative: 1 %
HEMATOCRIT: 37.6 % (ref 36.0–46.0)
HEMOGLOBIN: 12.2 g/dL (ref 12.0–15.0)
LYMPHS ABS: 1.6 10*3/uL (ref 0.7–4.0)
LYMPHS PCT: 17 %
MCH: 29.8 pg (ref 26.0–34.0)
MCHC: 32.4 g/dL (ref 30.0–36.0)
MCV: 91.7 fL (ref 78.0–100.0)
MONOS PCT: 8 %
Monocytes Absolute: 0.8 10*3/uL (ref 0.1–1.0)
NEUTROS ABS: 6.8 10*3/uL (ref 1.7–7.7)
NEUTROS PCT: 74 %
Platelets: 197 10*3/uL (ref 150–400)
RBC: 4.1 MIL/uL (ref 3.87–5.11)
RDW: 13.6 % (ref 11.5–15.5)
WBC: 9.3 10*3/uL (ref 4.0–10.5)

## 2015-02-24 LAB — COMPREHENSIVE METABOLIC PANEL
ALBUMIN: 3.7 g/dL (ref 3.5–5.0)
ALK PHOS: 98 U/L (ref 38–126)
ALT: 12 U/L — ABNORMAL LOW (ref 14–54)
AST: 20 U/L (ref 15–41)
Anion gap: 9 (ref 5–15)
BILIRUBIN TOTAL: 0.6 mg/dL (ref 0.3–1.2)
BUN: 22 mg/dL — AB (ref 6–20)
CO2: 25 mmol/L (ref 22–32)
Calcium: 9.2 mg/dL (ref 8.9–10.3)
Chloride: 105 mmol/L (ref 101–111)
Creatinine, Ser: 0.9 mg/dL (ref 0.44–1.00)
GFR calc Af Amer: >60 mL/min (ref >60)
GFR calc non Af Amer: 54 mL/min — ABNORMAL LOW (ref >60)
GLUCOSE: 153 mg/dL — AB (ref 65–99)
POTASSIUM: 4.3 mmol/L (ref 3.5–5.1)
Sodium: 139 mmol/L (ref 135–145)
Total Protein: 6.5 g/dL (ref 6.5–8.1)

## 2015-02-24 LAB — URINALYSIS, ROUTINE W REFLEX MICROSCOPIC
BILIRUBIN URINE: NEGATIVE
Glucose, UA: NEGATIVE mg/dL
Ketones, ur: NEGATIVE mg/dL
NITRITE: POSITIVE — AB
PH: 6 (ref 5.0–8.0)
Protein, ur: NEGATIVE mg/dL
SPECIFIC GRAVITY, URINE: 1.02 (ref 1.005–1.030)

## 2015-02-24 LAB — URINE MICROSCOPIC-ADD ON

## 2015-02-24 LAB — TROPONIN I: Troponin I: <0.03 ng/mL (ref <0.031)

## 2015-02-24 MED ORDER — ONDANSETRON HCL 4 MG PO TABS: 4.0000 mg | ORAL_TABLET | Freq: Three times a day (TID) | ORAL | Status: DC | PRN

## 2015-02-24 MED ORDER — CEPHALEXIN 500 MG PO CAPS: 500.0000 mg | ORAL_CAPSULE | Freq: Three times a day (TID) | ORAL | Status: DC

## 2015-02-24 MED ORDER — DEXTROSE 5 % IV SOLN
1.0000 g | Freq: Once | INTRAVENOUS | Status: AC
  Administered 2015-02-24: 1 g via INTRAVENOUS
  Filled 2015-02-24: qty 10

## 2015-02-24 NOTE — ED Provider Notes (Signed)
CSN: [id number]     Arrival date & time 02/23/15  2314 History  By signing my name below, I, Hilda Lias, attest that this documentation has been prepared under the direction and in the presence of Rolland Porter, MD [phone]. Electronically Signed: Hilda Lias, ED Scribe. 02/24/2015. 12:30 AM.   Chief Complaint  Patient presents with  . Dizziness      Patient is a 82 y.o. female presenting with dizziness. The history is provided by the patient. No language interpreter was used.  Dizziness Associated symptoms: no headaches and no vomiting    HPI Comments: Cassandra Glenn is a 82 y.o. female who presents to the Emergency Department complaining of feeling like she would pass out about 7:30 PM and felt hot and unsteady on her feet. She states this happened about an hour after eating a very large meal at her daughter's house. She had nausea without vomiting. She denies headache or chest pain or abdominal pain but states her abdomen feels full.. Pt states she went back to her care facility and had a blood pressure of 100/60. Pt describes the dizziness feeling as "unsteady on my feet", and reports she began to feel like she was going to pass out after she ate at her daughter's house. This feeling lasted about 20 minutes. She states she "still feels stuffed". Daughter does verify she ate a lot tonight, much more than usual. Daughter states she's had some similar symptoms with the weakness when she's had a low sodium or urinary tract infection. Patient does self cathing due to a neurogenic bladder. She self caths twice a day. Pt lives alone in an apartment at an assisted care facility. Pt denies vomiting, headache, abdominal pain, diaphoresis.   PCP Dr Charolette Forward  Past Medical History  Diagnosis Date  . Macular degeneration   . HOH (hard of hearing)   . Vaginitis and vulvovaginitis 02/29/2012  . Abnormalities of the hair 02/29/2012  . Spinal stenosis, lumbar region, with neurogenic claudication 11/02/2011   . Trigger finger (acquired) 11/02/2011  . Right bundle branch block 08/30/2011  . Edema 05/07/2011  . Contact dermatitis and other eczema, due to unspecified cause 05/07/2011  . Hypopotassemia 03/26/2011  . Candidiasis of skin and nails 03/19/2011  . Hyposmolality and/or hyponatremia 03/19/2011  . Diverticulosis of colon (without mention of hemorrhage) 03/19/2011  . Unspecified constipation 03/19/2011  . Pain in joint, pelvic region and thigh 03/19/2011  . Lumbago 03/19/2011  . Spasm of muscle 03/19/2011  . Osteoporosis, unspecified 03/19/2011  . Insomnia, unspecified 03/19/2011  . Other malaise and fatigue 03/19/2011  . Impaired fasting glucose 03/19/2011  . Hypotension, unspecified 03/18/2011  . Closed fracture of base of neck of femur ([place]) 03/18/2011  . Arthritis   . Shortness of breath   . Kidney infection   . Cancer ([place])   . Malignant neoplasm of breast (female), unspecified site 10/28/2002  . Depression 07/23/2014   Past Surgical History  Procedure Laterality Date  . Cataract extraction  2010    bialteral  . Hip pinning,cannulated  03/08/2011    Procedure: CANNULATED HIP PINNING;  Surgeon: Johnn Hai, MD;  Location: [facility];  Service: Orthopedics;  Laterality: Right;  . Eye surgery      cataract  . Fracture surgery Left 09/1980    ankle  . Breast surgery Bilateral 2005-10/27/2004    lumpectomy- Streck,MD  . Squamous cell carcinoma excision Right 02/04/2011    forearm- Taffeen, MD  . Orif hip fracture Right 03/08/2011  Bean,MD  . Skin lesion excision Right 02/04/2011    Abdomen lesion spongiotic dermatitis-Taffeen, MD  . Skin cancer excision Left 10/12/2012    lower leg Dr. [place]  . Tonsillectomy    . Total hip arthroplasty Right 05/28/2014    Procedure: TOTAL HIP ARTHROPLASTY ANTERIOR APPROACH;  Surgeon: Melrose Nakayama, MD;  Location: [facility];  Service: Orthopedics;  Laterality: Right;  . Hardware removal Right 05/28/2014    Procedure: HARDWARE REMOVAL;   Surgeon: Melrose Nakayama, MD;  Location: [facility];  Service: Orthopedics;  Laterality: Right;   Family History  Problem Relation Age of Onset  . Heart disease Mother   . Cancer Father   . Emphysema Brother   . Alzheimer's disease Brother      Social History  Substance Use Topics  . Smoking status: Never Smoker   . Smokeless tobacco: Never Used  . Alcohol Use: 0.0 oz/week    0 Glasses of wine per week     Comment: occasional glass   Lives in independent living  Lives alone  OB History    No data available     Review of Systems  Constitutional: Negative for diaphoresis.  Gastrointestinal: Negative for vomiting and abdominal pain.  Neurological: Positive for dizziness. Negative for headaches.  All other systems reviewed and are negative.     Allergies  Review of patient's allergies indicates no known allergies.  Home Medications   Prior to Admission medications   Medication Sig Start Date End Date Taking? Authorizing Provider  acetaminophen (TYLENOL) 500 MG tablet Take 1,000 mg by mouth every 6 (six) hours as needed for moderate pain.    Yes Historical Provider, MD  aspirin 81 MG chewable tablet Chew 81 mg by mouth daily.    Yes Historical Provider, MD  Bilberry 1000 MG CAPS Take 1,000 mg by mouth daily.    Yes Historical Provider, MD  Calcium Carbonate-Vitamin D (CALCIUM 600/VITAMIN D PO) Take 600 mg by mouth 2 (two) times daily.    Yes Historical Provider, MD  Calcium Polycarbophil (FIBERCON PO) Take 1 tablet by mouth 2 (two) times daily.    Yes Historical Provider, MD  cholecalciferol (VITAMIN D) 400 UNITS TABS Take 400 Units by mouth daily.    Yes Historical Provider, MD  CRANBERRY EXTRACT PO Take 1 tablet by mouth daily.   Yes Historical Provider, MD  ESTRACE VAGINAL 0.1 MG/GM vaginal cream Place 1 application vaginally daily as needed. For dryness 11/26/14  Yes Historical Provider, MD  fish oil-omega-3 fatty acids 1000 MG capsule Take 1 g by mouth daily.    Yes  Historical Provider, MD  Glucosamine-Chondroitin (GLUCOSAMINE CHONDR COMPLEX PO) Take 1 tablet by mouth 3 (three) times daily. for joints   Yes Historical Provider, MD  mirtazapine (REMERON SOL-TAB) 15 MG disintegrating tablet Take 15 mg by mouth every evening. 02/19/15  Yes Historical Provider, MD  Multiple Vitamins-Minerals (PRESERVISION/LUTEIN) CAPS Take 1 capsule by mouth 2 (two) times daily.   Yes Historical Provider, MD  polyethylene glycol (MIRALAX / GLYCOLAX) packet Take 17 g by mouth daily as needed (for constipation.).    Yes Historical Provider, MD  Probiotic Product (RESTORA) CAPS Take 1 capsule by mouth daily.   Yes Historical Provider, MD  triamcinolone cream (KENALOG) 0.1 % Apply 1 application topically daily as needed. After showering. 12/16/14  Yes Historical Provider, MD  cephALEXin (KEFLEX) 500 MG capsule Take 1 capsule (500 mg total) by mouth 3 (three) times daily. 02/24/15   Rolland Porter, MD  ciprofloxacin (  CIPRO) 500 MG tablet Take 1 tablet (500 mg total) by mouth 2 (two) times daily. Patient already has 12 pills please deliver 2 pills to patient Patient not taking: Reported on 02/24/2015 01/30/15   Tiffany L Reed, DO  ondansetron (ZOFRAN) 4 MG tablet Take 1 tablet (4 mg total) by mouth every 8 (eight) hours as needed for nausea or vomiting. 02/24/15   Rolland Porter, MD  zolpidem (AMBIEN) 5 MG tablet Take 1/2 tablet by mouth at bedtime as needed for sleep Patient not taking: Reported on 02/24/2015 09/30/14   Tiffany L Reed, DO   BP 127/58 mmHg  Pulse 69  Temp(Src) 97.8 F (36.6 C) (Oral)  Resp 24  Ht 5\' 1"  (1.549 m)  Wt 137 lb (62.143 kg)  BMI 25.90 kg/m2  SpO2 98%  Vital signs normal   Physical Exam  Constitutional: She is oriented to person, place, and time. She appears well-developed and well-nourished.  Non-toxic appearance. She does not appear ill. No distress.  HENT:  Head: Normocephalic and atraumatic.  Right Ear: External ear normal.  Left Ear: External ear normal.   Nose: Nose normal. No mucosal edema or rhinorrhea.  Mouth/Throat: Oropharynx is clear and moist and mucous membranes are normal. No dental abscesses or uvula swelling.  Eyes: Conjunctivae and EOM are normal. Pupils are equal, round, and reactive to light.  Neck: Normal range of motion and full passive range of motion without pain. Neck supple.  Cardiovascular: Normal rate, regular rhythm and normal heart sounds.  Exam reveals no gallop and no friction rub.   No murmur heard. Pulmonary/Chest: Effort normal and breath sounds normal. No respiratory distress. She has no wheezes. She has no rhonchi. She has no rales. She exhibits no tenderness and no crepitus.  Abdominal: Soft. Normal appearance and bowel sounds are normal. She exhibits distension. There is no tenderness. There is no rebound and no guarding.  Musculoskeletal: Normal range of motion. She exhibits no edema or tenderness.  Moves all extremities well.   Neurological: She is alert and oriented to person, place, and time. She has normal strength. No cranial nerve deficit.  Skin: Skin is warm, dry and intact. No rash noted. No erythema. There is pallor.  Psychiatric: She has a normal mood and affect. Her speech is normal and behavior is normal. Her mood appears not anxious.  Nursing note and vitals reviewed.   ED Course  Procedures (including critical care time)  Medications  cefTRIAXone (ROCEPHIN) 1 g in dextrose 5 % 50 mL IVPB (0 g Intravenous Stopped 02/24/15 0240)     DIAGNOSTIC STUDIES: Oxygen Saturation is 98% on room air, normal by my interpretation.    COORDINATION OF CARE: 12:28 AM Discussed treatment plan with pt at bedside and pt agreed to plan.  After reviewing her labs urine culture was sent to lab and she was given Rocephin 1 g IV. Patient reports she just finished a course of Cipro 2 weeks ago and she gets frequent urinary tract infections. She states she usually is treated with either Cipro, Augmentin, or  cephalexin. I am unable to find any recent urine cultures in Epic laboratory results. When I review her PCP, Dr. Kathrin Penner notes he discusses a urinary tract infection but does not state the specific organism at that time. Patient states she feels like she can go home with antibiotics.   Labs Review Results for orders placed or performed during the hospital encounter of 02/23/15  Comprehensive metabolic panel  Result Value Ref Range  Sodium 139 135 - 145 mmol/L   Potassium 4.3 3.5 - 5.1 mmol/L   Chloride 105 101 - 111 mmol/L   CO2 25 22 - 32 mmol/L   Glucose, Bld 153 (H) 65 - 99 mg/dL   BUN 22 (H) 6 - 20 mg/dL   Creatinine, Ser 0.90 0.44 - 1.00 mg/dL   Calcium 9.2 8.9 - 10.3 mg/dL   Total Protein 6.5 6.5 - 8.1 g/dL   Albumin 3.7 3.5 - 5.0 g/dL   AST 20 15 - 41 U/L   ALT 12 (L) 14 - 54 U/L   Alkaline Phosphatase 98 38 - 126 U/L   Total Bilirubin 0.6 0.3 - 1.2 mg/dL   GFR calc non Af Amer 54 (L) >60 mL/min   GFR calc Af Amer >60 >60 mL/min   Anion gap 9 5 - 15  CBC with Differential  Result Value Ref Range   WBC 9.3 4.0 - 10.5 K/uL   RBC 4.10 3.87 - 5.11 MIL/uL   Hemoglobin 12.2 12.0 - 15.0 g/dL   HCT 37.6 36.0 - 46.0 %   MCV 91.7 78.0 - 100.0 fL   MCH 29.8 26.0 - 34.0 pg   MCHC 32.4 30.0 - 36.0 g/dL   RDW 13.6 11.5 - 15.5 %   Platelets 197 150 - 400 K/uL   Neutrophils Relative % 74 %   Neutro Abs 6.8 1.7 - 7.7 K/uL   Lymphocytes Relative 17 %   Lymphs Abs 1.6 0.7 - 4.0 K/uL   Monocytes Relative 8 %   Monocytes Absolute 0.8 0.1 - 1.0 K/uL   Eosinophils Relative 1 %   Eosinophils Absolute 0.1 0.0 - 0.7 K/uL   Basophils Relative 0 %   Basophils Absolute 0.0 0.0 - 0.1 K/uL  Urinalysis, Routine w reflex microscopic  Result Value Ref Range   Color, Urine YELLOW YELLOW   APPearance TURBID (A) CLEAR   Specific Gravity, Urine 1.020 1.005 - 1.030   pH 6.0 5.0 - 8.0   Glucose, UA NEGATIVE NEGATIVE mg/dL   Hgb urine dipstick TRACE (A) NEGATIVE   Bilirubin Urine NEGATIVE  NEGATIVE   Ketones, ur NEGATIVE NEGATIVE mg/dL   Protein, ur NEGATIVE NEGATIVE mg/dL   Nitrite POSITIVE (A) NEGATIVE   Leukocytes, UA LARGE (A) NEGATIVE  Troponin I  Result Value Ref Range   Troponin I <0.03 <0.031 ng/mL  Urine microscopic-add on  Result Value Ref Range   Squamous Epithelial / LPF 6-30 (A) NONE SEEN   WBC, UA TOO NUMEROUS TO COUNT 0 - 5 WBC/hpf   RBC / HPF 0-5 0 - 5 RBC/hpf   Bacteria, UA MANY (A) NONE SEEN   Casts HYALINE CASTS (A) NEGATIVE  CBG monitoring, ED  Result Value Ref Range   Glucose-Capillary 140 (H) 65 - 99 mg/dL   Laboratory interpretation all normal except UTI     Imaging Review No results found. I have personally reviewed and evaluated these images and lab results as part of my medical decision-making.   EKG Interpretation   Date/Time:  Sunday February 23 2015 23:55:59 EST Ventricular Rate:  70 PR Interval:  177 QRS Duration: 122 QT Interval:  412 QTC Calculation: 445 R Axis:   89 Text Interpretation:  Sinus rhythm Right bundle branch block No  significant change since last tracing 22 May 2014 Confirmed by Danaysha Kirn   MD-I, Aleshka Corney ([id number]) on 02/24/2015 1:30:41 AM      MDM   Final diagnoses:  Urinary tract infection without hematuria, site unspecified  Nausea    New Prescriptions   CEPHALEXIN (KEFLEX) 500 MG CAPSULE    Take 1 capsule (500 mg total) by mouth 3 (three) times daily.   ONDANSETRON (ZOFRAN) 4 MG TABLET    Take 1 tablet (4 mg total) by mouth every 8 (eight) hours as needed for nausea or vomiting.    Plan discharge  Rolland Porter, MD, FACEP   I personally performed the services described in this documentation, which was scribed in my presence. The recorded information has been reviewed and considered.   Rolland Porter, MD, Barbette Or, MD 02/24/15 [phone]

## 2015-02-24 NOTE — Discharge Instructions (Signed)
Use the zofran if you get nauseated again. Take the antibiotic until gone. Let Dr Rolly Salter office know you came to the ED and we sent a urine culture tonight. Return sooner if you get a fever, abdominal pain, persistent vomiting not controlled by the zofran.

## 2015-02-26 LAB — URINE CULTURE
Culture: >=100000
SPECIAL REQUESTS: NORMAL

## 2015-02-27 ENCOUNTER — Telehealth: Payer: Self-pay | Admitting: *Deleted

## 2015-02-27 ENCOUNTER — Telehealth (HOSPITAL_BASED_OUTPATIENT_CLINIC_OR_DEPARTMENT_OTHER): Payer: Self-pay | Admitting: Emergency Medicine

## 2015-02-27 NOTE — Telephone Encounter (Signed)
yes

## 2015-02-27 NOTE — Telephone Encounter (Signed)
Post ED Visit - Positive Culture Follow-up  Culture report reviewed by antimicrobial stewardship pharmacist:  []  Elenor Quinones, Pharm.D. []  Heide Guile, Pharm.D., BCPS []  Parks Neptune, Pharm.D. [x]  Alycia Rossetti, Pharm.D., BCPS []  [place], Pharm.D., BCPS, AAHIVP []  Legrand Como, Pharm.D., BCPS, AAHIVP []  Milus Glazier, Pharm.D. []  Rob Evette Doffing, Pharm.D.  Positive urine culture Klebsiella Treated with cephalexin, organism sensitive to the same and no further patient follow-up is required at this time.  Hazle Nordmann 02/27/2015, 9:09 AM

## 2015-02-27 NOTE — Telephone Encounter (Signed)
Cassandra Glenn, daughter called and stated that her mother was in the ER over the weekend with a UTI and antibiotic was given. Daughter wants to know if the antibiotic given takes care of the bacteria that Cassandra Glenn have been found in urine. Patient is on Keflex 500mg  three times daily. Please Advise.

## 2015-02-28 NOTE — Telephone Encounter (Signed)
Patient daughter notified

## 2015-03-10 DIAGNOSIS — Z0000 Encounter for general adult medical examination without abnormal findings: Secondary | ICD-10-CM | POA: Diagnosis not present

## 2015-03-10 DIAGNOSIS — R300 Dysuria: Secondary | ICD-10-CM | POA: Diagnosis not present

## 2015-03-10 DIAGNOSIS — N390 Urinary tract infection, site not specified: Secondary | ICD-10-CM | POA: Diagnosis not present

## 2015-03-20 DIAGNOSIS — L603 Nail dystrophy: Secondary | ICD-10-CM | POA: Diagnosis not present

## 2015-03-20 DIAGNOSIS — I739 Peripheral vascular disease, unspecified: Secondary | ICD-10-CM | POA: Diagnosis not present

## 2015-04-03 DIAGNOSIS — N390 Urinary tract infection, site not specified: Secondary | ICD-10-CM | POA: Diagnosis not present

## 2015-04-04 ENCOUNTER — Telehealth: Payer: Self-pay | Admitting: *Deleted

## 2015-04-04 MED ORDER — AMOXICILLIN 250 MG PO CAPS: 250.0000 mg | ORAL_CAPSULE | Freq: Three times a day (TID) | ORAL | Status: DC

## 2015-04-04 NOTE — Telephone Encounter (Signed)
Cassandra Glenn, daughter called and stated that patient has all symptoms of UTI and needs something called in today. Stated that Cassandra Glenn has faxed the Urine results and we need to call something in for her mother. I called [name] with [facility] and got the Urine results but the Urine Culture will not be ready till Monday. Daughter wants something today. Given to Dr. Mariea Clonts to review and sign.   Per Dr. Blanchie Serve fluids, Amoxicillin 250mg  one po tid x 7 days, yogurt daily.  Called [place] back and agreed. Faxed Rx to pharmacy. We will await Urine Culture.

## 2015-04-07 ENCOUNTER — Telehealth: Payer: Self-pay

## 2015-04-07 DIAGNOSIS — M25551 Pain in right hip: Secondary | ICD-10-CM | POA: Diagnosis not present

## 2015-04-07 MED ORDER — CIPROFLOXACIN HCL 250 MG PO TABS: ORAL_TABLET | ORAL | Status: DC

## 2015-04-07 NOTE — Telephone Encounter (Signed)
Called daughter, we received the urine culture today. The bacteria is resistant to the Amoxicillin called in on Friday. She will need to d/c the Amoxicillin, we will fax in Cipro 250mg  bid for 7 days to [facility]. She will pick it up, they don't need to deliver.

## 2015-04-14 ENCOUNTER — Other Ambulatory Visit: Payer: Self-pay | Admitting: Internal Medicine

## 2015-04-17 DIAGNOSIS — L570 Actinic keratosis: Secondary | ICD-10-CM | POA: Diagnosis not present

## 2015-04-17 DIAGNOSIS — L309 Dermatitis, unspecified: Secondary | ICD-10-CM | POA: Diagnosis not present

## 2015-04-18 ENCOUNTER — Encounter: Payer: Self-pay | Admitting: Internal Medicine

## 2015-04-22 DIAGNOSIS — N390 Urinary tract infection, site not specified: Secondary | ICD-10-CM | POA: Diagnosis not present

## 2015-04-22 DIAGNOSIS — Z0000 Encounter for general adult medical examination without abnormal findings: Secondary | ICD-10-CM | POA: Diagnosis not present

## 2015-04-22 DIAGNOSIS — R3915 Urgency of urination: Secondary | ICD-10-CM | POA: Diagnosis not present

## 2015-04-22 DIAGNOSIS — R350 Frequency of micturition: Secondary | ICD-10-CM | POA: Diagnosis not present

## 2015-05-02 DIAGNOSIS — E871 Hypo-osmolality and hyponatremia: Secondary | ICD-10-CM | POA: Diagnosis not present

## 2015-05-02 DIAGNOSIS — Z0000 Encounter for general adult medical examination without abnormal findings: Secondary | ICD-10-CM | POA: Diagnosis not present

## 2015-05-02 DIAGNOSIS — N390 Urinary tract infection, site not specified: Secondary | ICD-10-CM | POA: Diagnosis not present

## 2015-05-02 DIAGNOSIS — R350 Frequency of micturition: Secondary | ICD-10-CM | POA: Diagnosis not present

## 2015-05-02 DIAGNOSIS — R42 Dizziness and giddiness: Secondary | ICD-10-CM | POA: Diagnosis not present

## 2015-05-21 ENCOUNTER — Other Ambulatory Visit: Payer: Self-pay | Admitting: Internal Medicine

## 2015-06-04 DIAGNOSIS — L603 Nail dystrophy: Secondary | ICD-10-CM | POA: Diagnosis not present

## 2015-06-04 DIAGNOSIS — I739 Peripheral vascular disease, unspecified: Secondary | ICD-10-CM | POA: Diagnosis not present

## 2015-06-16 DIAGNOSIS — N3020 Other chronic cystitis without hematuria: Secondary | ICD-10-CM | POA: Diagnosis not present

## 2015-06-18 ENCOUNTER — Other Ambulatory Visit: Payer: Self-pay | Admitting: Internal Medicine

## 2015-07-03 ENCOUNTER — Ambulatory Visit (INDEPENDENT_AMBULATORY_CARE_PROVIDER_SITE_OTHER): Payer: Medicare Other | Admitting: Physician Assistant

## 2015-07-03 VITALS — BP 172/80 | HR 76 | Temp 98.0°F | Resp 18 | Ht 61.0 in | Wt 140.0 lb

## 2015-07-03 DIAGNOSIS — R030 Elevated blood-pressure reading, without diagnosis of hypertension: Secondary | ICD-10-CM | POA: Diagnosis not present

## 2015-07-03 DIAGNOSIS — N390 Urinary tract infection, site not specified: Secondary | ICD-10-CM

## 2015-07-03 DIAGNOSIS — R42 Dizziness and giddiness: Secondary | ICD-10-CM | POA: Diagnosis not present

## 2015-07-03 LAB — POCT URINALYSIS DIP (MANUAL ENTRY)
BILIRUBIN UA: NEGATIVE
GLUCOSE UA: NEGATIVE
Ketones, POC UA: NEGATIVE
NITRITE UA: POSITIVE — AB
Protein Ur, POC: NEGATIVE
Spec Grav, UA: 1.01
Urobilinogen, UA: 0.2
pH, UA: 6.5

## 2015-07-03 LAB — COMPLETE METABOLIC PANEL WITH GFR
ALBUMIN: 4.3 g/dL (ref 3.6–5.1)
ALK PHOS: 119 U/L (ref 33–130)
ALT: 11 U/L (ref 6–29)
AST: 19 U/L (ref 10–35)
BILIRUBIN TOTAL: 0.5 mg/dL (ref 0.2–1.2)
BUN: 11 mg/dL (ref 7–25)
CALCIUM: 8 mg/dL — AB (ref 8.6–10.4)
CO2: 28 mmol/L (ref 20–31)
CREATININE: 0.86 mg/dL (ref 0.60–0.88)
Chloride: 95 mmol/L — ABNORMAL LOW (ref 98–110)
GFR, Est African American: 68 mL/min (ref >=60)
GFR, Est Non African American: 59 mL/min — ABNORMAL LOW (ref >=60)
Glucose, Bld: 98 mg/dL (ref 65–99)
Potassium: 4.8 mmol/L (ref 3.5–5.3)
Sodium: 131 mmol/L — ABNORMAL LOW (ref 135–146)
TOTAL PROTEIN: 6.8 g/dL (ref 6.1–8.1)

## 2015-07-03 MED ORDER — CEPHALEXIN 500 MG PO CAPS
500.0000 mg | ORAL_CAPSULE | Freq: Three times a day (TID) | ORAL | Status: AC
Start: 2015-07-03 — End: 2015-07-13

## 2015-07-03 NOTE — Progress Notes (Signed)
Cassandra Glenn  MRN: [id number] DOB: 1920/09/14  Subjective:  Pt presents to clinic for concern of UTI and low sodium. Pt states that the urinary symptoms are not present but she has been feeling "woozy" for the past three days, which sometimes happens when she has not cleared a UTI or occurs when her sodium levels are low.  Today, patient requests to have urinalysis and urine culture checked to see if UTI has reoccured and also requests sodium levels to see if this has something to do with the woozy feeling she is having.   Pt saw Dr. Louis Meckel on 06/23/15 for UTI symptoms. Was prescribed Bactrim. Finished course of Bactrim last Monday 06/30/15. Has an extensive history of UTIs for the past few years. Currently using self catheter twice a day and has been for the past year.   Pt is most commonly prescribed cipro for UTI but after a couple of weeks, the symptoms reoccur.Pt is on Trimethoprim 100mg  daily for preventative measures.   Pt drinks gatorade and eats salty food to ensure that her sodium does not go low because this is also a common occurrence, does not typically have any issues with high blood pressure.     Patient Active Problem List   Diagnosis Date Noted  . Chorioretinal scar, macular 01/23/2015  . Osteoporosis 08/06/2014  . Depression 07/23/2014  . Candidiasis of skin 06/25/2014  . Constipation 06/04/2014  . Urinary retention 05/30/2014  . Primary osteoarthritis of right hip 05/28/2014  . Cancer antigen 125 (CA 125) elevation 11/27/2013  . IBS (irritable bowel syndrome) 11/27/2013  . Purpura senilis ([place]) 07/31/2013  . TMJ click [id number]  . Insomnia 04/24/2013  . Urinary frequency 04/24/2013  . Hyponatremia 04/19/2013  . Hyperglycemia 04/19/2013  . History of fall 04/17/2013  . UTI (urinary tract infection) 04/16/2013  . Abnormal CT scan, chest 03/23/2013  . Compression fracture of thoracic vertebra (HCC) 11/24/2012  . AMD (age-related macular degeneration), wet  ([place]) 05/27/2011  . Closed fracture of base of neck of femur ([place]) 03/18/2011  . 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 Nonexudative age-related macular degeneration 12/10/2010  . Posterior vitreous detachment 12/10/2010  . Cancer of breast, female ([place]) 10/27/2004    Current Outpatient Prescriptions on File Prior to Visit  Medication Sig Dispense Refill  . acetaminophen (TYLENOL) 500 MG tablet Take 1,000 mg by mouth every 6 (six) hours as needed for moderate pain.     Marland Kitchen aspirin 81 MG chewable tablet Chew 81 mg by mouth daily.     . Bilberry 1000 MG CAPS Take 1,000 mg by mouth daily.     . Calcium Carbonate-Vitamin D (CALCIUM 600/VITAMIN D PO) Take 600 mg by mouth 2 (two) times daily.     . Calcium Polycarbophil (FIBERCON PO) Take 1 tablet by mouth 2 (two) times daily.     . cholecalciferol (VITAMIN D) 400 UNITS TABS Take 400 Units by mouth daily.     Marland Kitchen CRANBERRY EXTRACT PO Take 1 tablet by mouth daily.    Marland Kitchen ESTRACE VAGINAL 0.1 MG/GM vaginal cream Place 1 application vaginally daily as needed. For dryness    . fish oil-omega-3 fatty acids 1000 MG capsule Take 1 g by mouth daily.     . Glucosamine-Chondroitin (GLUCOSAMINE CHONDR COMPLEX PO) Take 1 tablet by mouth 3 (three) times daily. for joints    . mirtazapine (REMERON SOL-TAB) 15 MG disintegrating tablet TAKE ONE TABLET AT BEDTIME TO HELP REST AND FOR DEPRESSION 30 tablet 0  . Multiple  Vitamins-Minerals (PRESERVISION/LUTEIN) CAPS Take 1 capsule by mouth 2 (two) times daily.    . polyethylene glycol (MIRALAX / GLYCOLAX) packet Take 17 g by mouth daily as needed (for constipation.).     Marland Kitchen Probiotic Product (RESTORA) CAPS Take 1 capsule by mouth daily.     No current facility-administered medications on file prior to visit.    No Known Allergies  Review of Systems  Constitutional: Negative for fever, appetite change, fatigue and unexpected weight change.  Respiratory: Negative for cough and shortness of breath.   Cardiovascular:  Negative for chest pain and palpitations.  Gastrointestinal: Negative for vomiting and diarrhea.  Genitourinary: Positive for urgency and frequency. Negative for dysuria and hematuria.  Neurological: Positive for dizziness and light-headedness. Negative for syncope and headaches.  Psychiatric/Behavioral: Positive for confusion. Negative for behavioral problems and agitation.   Objective:  BP 172/80 mmHg  Pulse 76  Temp(Src) 98 F (36.7 C) (Oral)  Resp 18  Ht 5\' 1"  (1.549 m)  Wt 140 lb (63.504 kg)  BMI 26.47 kg/m2  SpO2 96%  Physical Exam  Constitutional: She is oriented to person, place, and time and well-developed, well-nourished, and in no distress.  HENT:  Head: Normocephalic and atraumatic.  Eyes: Conjunctivae are normal. Pupils are equal, round, and reactive to light.  Neck: Normal range of motion.  Cardiovascular: Normal rate, regular rhythm and normal heart sounds.   Pulmonary/Chest: Effort normal and breath sounds normal.  Abdominal: Soft. Normal appearance and bowel sounds are normal. She exhibits no distension. There is no tenderness. There is no guarding and no CVA tenderness.  Musculoskeletal: Normal range of motion.  Neurological: She is alert and oriented to person, place, and time. She has normal reflexes. Gait normal.  Skin: Skin is warm and dry.  Psychiatric: Affect normal.     Results for orders placed or performed in visit on 07/03/15  POCT urinalysis dipstick  Result Value Ref Range   Color, UA yellow yellow   Clarity, UA cloudy (A) clear   Glucose, UA negative negative   Bilirubin, UA negative negative   Ketones, POC UA negative negative   Spec Grav, UA 1.010    Blood, UA moderate (A) negative   pH, UA 6.5    Protein Ur, POC negative negative   Urobilinogen, UA 0.2    Nitrite, UA Positive (A) Negative   Leukocytes, UA moderate (2+) (A) Negative      Assessment and Plan :   1. Dizziness -Patient states that she typically feels mild dizziness  when her UTI is recurring or when her sodium is low. She requested a sodium check.  - COMPLETE METABOLIC PANEL WITH GFR  2. Recurrent UTI (urinary tract infection) -Patient just completed a 7 day course of Bactrim on 06/30/15. Only symptom she is having is feeling slightly dizzy. States that this usually occurs when she is having a recurrent UTI or when she has low sodium.  - POCT urinalysis dipstick shows positive nitrites and moderate leukocytes - Urine culture - Presribed cephALEXin (KEFLEX) 500 MG capsule; Take 1 capsule (500 mg total) by mouth 3 (three) times daily.  Dispense: 30 capsule; Refill: 0  3. Blood pressure elevated without history of HTN -Last bp reading in office was 172/80.Patient has no hx of htn but does state that she eats lots of salty foods to avoid low sodium. Pt has been informed to have nurse at independent living to check bp every other day and to contact PCP if bp remains elevated.  Patient Instructions   Please take the antibiotic as prescribed for its full course. Take with food to decrease stomach upset. Please follow up if symptoms worsen or do not improve.  In terms of blood pressure, please have nurse at independent living check every other day for the next week. If still elevated, contact PCP for further evaluation. Normal bp is at or below 150/90.     IF you received an x-ray today, you will receive an invoice from [facility]. Please contact [facility] at [phone] with questions or concerns regarding your invoice.   IF you received labwork today, you will receive an invoice from [employer]. Please contact [facility] at [phone] with questions or concerns regarding your invoice.   Our billing staff will not be able to assist you with questions regarding bills from these companies.  You will be contacted with the lab results as soon as they are available. The fastest way to get your results is to  activate your My Chart account. Instructions are located on the last page of this paperwork. If you have not heard from [place] regarding the results in 2 weeks, please contact this office.     We recommend that you schedule a mammogram for breast cancer screening. Typically, you do not need a referral to do this. Please contact a local imaging center to schedule your mammogram.  [facility] - [phone]  *ask for the Radiology Department [facility] ([place]) - [phone] or [phone]  [facility] - [phone] [place] [phone] [facility] - [phone]  *ask for the [facility] - [phone]  *ask for the Radiology Department [facility] - [phone]  *ask for the Mammography Department [place] - [phone]       Tenna Delaine PA-C  [facility] 07/03/2015 12:33 PM

## 2015-07-03 NOTE — Patient Instructions (Addendum)
Please take the antibiotic as prescribed for its full course. Take with food to decrease stomach upset. Please follow up if symptoms worsen or do not improve.  In terms of blood pressure, please have nurse at independent living check every other day for the next week. If still elevated, contact PCP for further evaluation. Normal bp is at or below 150/90.     IF you received an x-ray today, you will receive an invoice from [facility]. Please contact [facility] at [phone] with questions or concerns regarding your invoice.   IF you received labwork today, you will receive an invoice from [employer]. Please contact [facility] at [phone] with questions or concerns regarding your invoice.   Our billing staff will not be able to assist you with questions regarding bills from these companies.  You will be contacted with the lab results as soon as they are available. The fastest way to get your results is to activate your My Chart account. Instructions are located on the last page of this paperwork. If you have not heard from [place] regarding the results in 2 weeks, please contact this office.     We recommend that you schedule a mammogram for breast cancer screening. Typically, you do not need a referral to do this. Please contact a local imaging center to schedule your mammogram.  [facility] - [phone]  *ask for the Radiology Department [facility] [place] ([place]) - [phone] or [phone]  [facility] - [phone] [place] [phone] [facility] - [phone]  *ask for the [facility] - [phone]  *ask for the Radiology Department [facility] - [phone]  *ask for the [place] - [phone]

## 2015-07-05 LAB — URINE CULTURE: Colony Count: >=100000

## 2015-07-07 ENCOUNTER — Other Ambulatory Visit: Payer: Self-pay | Admitting: Physician Assistant

## 2015-07-07 DIAGNOSIS — N3090 Cystitis, unspecified without hematuria: Secondary | ICD-10-CM

## 2015-07-07 MED ORDER — CIPROFLOXACIN HCL 500 MG PO TABS: 500.0000 mg | ORAL_TABLET | Freq: Two times a day (BID) | ORAL | Status: DC

## 2015-07-11 DIAGNOSIS — M545 Low back pain: Secondary | ICD-10-CM | POA: Diagnosis not present

## 2015-07-11 DIAGNOSIS — M6281 Muscle weakness (generalized): Secondary | ICD-10-CM | POA: Diagnosis not present

## 2015-07-15 DIAGNOSIS — M6281 Muscle weakness (generalized): Secondary | ICD-10-CM | POA: Diagnosis not present

## 2015-07-15 DIAGNOSIS — M545 Low back pain: Secondary | ICD-10-CM | POA: Diagnosis not present

## 2015-07-22 DIAGNOSIS — M545 Low back pain: Secondary | ICD-10-CM | POA: Diagnosis not present

## 2015-07-22 DIAGNOSIS — M6281 Muscle weakness (generalized): Secondary | ICD-10-CM | POA: Diagnosis not present

## 2015-07-24 ENCOUNTER — Other Ambulatory Visit: Payer: Self-pay

## 2015-07-24 DIAGNOSIS — R739 Hyperglycemia, unspecified: Secondary | ICD-10-CM | POA: Diagnosis not present

## 2015-07-24 DIAGNOSIS — N390 Urinary tract infection, site not specified: Secondary | ICD-10-CM | POA: Diagnosis not present

## 2015-07-24 DIAGNOSIS — R971 Elevated cancer antigen 125 [CA 125]: Secondary | ICD-10-CM | POA: Diagnosis not present

## 2015-07-24 LAB — HEPATIC FUNCTION PANEL
ALK PHOS: 120 U/L (ref 25–125)
ALT: 10 U/L (ref 7–35)
AST: 17 U/L (ref 13–35)
Bilirubin, Total: 0.5 mg/dL

## 2015-07-24 LAB — BASIC METABOLIC PANEL
BUN: 15 mg/dL (ref 4–21)
CREATININE: 0.9 mg/dL (ref 0.5–1.1)
GLUCOSE: 90 mg/dL
POTASSIUM: 5.1 mmol/L (ref 3.4–5.3)
SODIUM: 138 mmol/L (ref 137–147)

## 2015-07-25 DIAGNOSIS — M545 Low back pain: Secondary | ICD-10-CM | POA: Diagnosis not present

## 2015-07-25 DIAGNOSIS — M6281 Muscle weakness (generalized): Secondary | ICD-10-CM | POA: Diagnosis not present

## 2015-07-28 DIAGNOSIS — H43813 Vitreous degeneration, bilateral: Secondary | ICD-10-CM | POA: Diagnosis not present

## 2015-07-28 DIAGNOSIS — H353222 Exudative age-related macular degeneration, left eye, with inactive choroidal neovascularization: Secondary | ICD-10-CM | POA: Diagnosis not present

## 2015-07-28 DIAGNOSIS — H02834 Dermatochalasis of left upper eyelid: Secondary | ICD-10-CM | POA: Diagnosis not present

## 2015-07-28 DIAGNOSIS — H02831 Dermatochalasis of right upper eyelid: Secondary | ICD-10-CM | POA: Diagnosis not present

## 2015-07-28 DIAGNOSIS — H353110 Nonexudative age-related macular degeneration, right eye, stage unspecified: Secondary | ICD-10-CM | POA: Diagnosis not present

## 2015-07-28 DIAGNOSIS — H353292 Exudative age-related macular degeneration, unspecified eye, with inactive choroidal neovascularization: Secondary | ICD-10-CM | POA: Diagnosis not present

## 2015-07-28 DIAGNOSIS — H35373 Puckering of macula, bilateral: Secondary | ICD-10-CM | POA: Diagnosis not present

## 2015-07-28 DIAGNOSIS — H353190 Nonexudative age-related macular degeneration, unspecified eye, stage unspecified: Secondary | ICD-10-CM | POA: Diagnosis not present

## 2015-07-28 DIAGNOSIS — Z961 Presence of intraocular lens: Secondary | ICD-10-CM | POA: Diagnosis not present

## 2015-07-29 ENCOUNTER — Non-Acute Institutional Stay: Payer: Medicare Other | Admitting: Internal Medicine

## 2015-07-29 ENCOUNTER — Encounter: Payer: Self-pay | Admitting: Internal Medicine

## 2015-07-29 VITALS — BP 142/72 | HR 79 | Temp 97.6°F | Ht 61.0 in | Wt 139.0 lb

## 2015-07-29 DIAGNOSIS — F329 Major depressive disorder, single episode, unspecified: Secondary | ICD-10-CM | POA: Diagnosis not present

## 2015-07-29 DIAGNOSIS — B009 Herpesviral infection, unspecified: Secondary | ICD-10-CM | POA: Diagnosis not present

## 2015-07-29 DIAGNOSIS — E871 Hypo-osmolality and hyponatremia: Secondary | ICD-10-CM | POA: Diagnosis not present

## 2015-07-29 DIAGNOSIS — G4700 Insomnia, unspecified: Secondary | ICD-10-CM | POA: Diagnosis not present

## 2015-07-29 DIAGNOSIS — M545 Low back pain: Secondary | ICD-10-CM | POA: Diagnosis not present

## 2015-07-29 DIAGNOSIS — K589 Irritable bowel syndrome without diarrhea: Secondary | ICD-10-CM | POA: Diagnosis not present

## 2015-07-29 DIAGNOSIS — M6281 Muscle weakness (generalized): Secondary | ICD-10-CM | POA: Diagnosis not present

## 2015-07-29 DIAGNOSIS — R739 Hyperglycemia, unspecified: Secondary | ICD-10-CM

## 2015-07-29 DIAGNOSIS — F32A Depression, unspecified: Secondary | ICD-10-CM

## 2015-07-29 DIAGNOSIS — N390 Urinary tract infection, site not specified: Secondary | ICD-10-CM

## 2015-07-29 MED ORDER — ZOLPIDEM TARTRATE 5 MG PO TABS: ORAL_TABLET | ORAL | Status: DC

## 2015-07-29 NOTE — Addendum Note (Signed)
Addended by: Estill Dooms on: 07/29/2015 09:58 AM   Modules accepted: Orders, Medications

## 2015-07-29 NOTE — Addendum Note (Signed)
Addended by: Estill Dooms on: 07/29/2015 10:09 AM   Modules accepted: Level of Service

## 2015-07-29 NOTE — Progress Notes (Addendum)
Patient ID: Cassandra Glenn, female   DOB: May 16, 1920, 82 y.o.   MRN: [id number]    [facility] (12)     No Known Allergies  Chief Complaint  Patient presents with  . Medical Management of Chronic Issues    6 month medication management hyperglycemia, depression, review labs.    HPI:  07/03/15 [facility]. Has UTI and mild hypoglycemia at 131 mEq/ L. Was put on Cipro and improved. Both problems seem resolved now.   Yesterday she had eye exam from Dr. Renford Dills at [facility].   Patient says she has a spot of "shingles". Was started on acyclovir 800 mg 5 x daily on 07/16/15.Had rash in the left scapular area in the past. Usually has blisters.   Also has a red lump in the right forearm with a reddish color.  Also has a reddish area on the left forearm that has been seen by Dr. Syble [place].  Sh does not think the mirtazipine has been helpful. Believes it is making her gain weight. Suspects it may be re,ated to here rash. She wants to stop it.  Sometimes has right lower quadrant discomfort. Only last a few minutes. Patient is unable to say that anything precipitates this discomfort. There are no alleviating factors either. She denies any association with meals or bowel movements. It does not seem to be related to her bladder issues.  Medications: Patient's Medications  New Prescriptions   No medications on file  Previous Medications   ACETAMINOPHEN (TYLENOL) 500 MG TABLET    Take 1,000 mg by mouth every 6 (six) hours as needed for moderate pain.    ACYCLOVIR (ZOVIRAX) 800 MG TABLET    Take one tablet 5 times a day for 7 days for shingles   ASPIRIN 81 MG CHEWABLE TABLET    Chew 81 mg by mouth daily.    BILBERRY 1000 MG CAPS    Take 1,000 mg by mouth daily.    CALCIUM CARBONATE-VITAMIN D (CALCIUM 600/VITAMIN D PO)    Take 600 mg by mouth 2 (two) times daily.    CALCIUM POLYCARBOPHIL (FIBERCON PO)    Take 1 tablet by mouth 2 (two) times daily.    CHOLECALCIFEROL (VITAMIN D) 400 UNITS TABS    Take 400 Units by mouth daily.    CRANBERRY EXTRACT PO    Take 1 tablet by mouth daily.   FISH OIL-OMEGA-3 FATTY ACIDS 1000 MG CAPSULE    Take 1 g by mouth daily.    GLUCOSAMINE-CHONDROITIN (GLUCOSAMINE CHONDR COMPLEX PO)    Take 1 tablet by mouth 3 (three) times daily. for joints   MIRTAZAPINE (REMERON SOL-TAB) 15 MG DISINTEGRATING TABLET    TAKE ONE TABLET AT BEDTIME TO HELP REST AND FOR DEPRESSION   MULTIPLE VITAMINS-MINERALS (PRESERVISION/LUTEIN) CAPS    Take 1 capsule by mouth 2 (two) times daily.   POLYETHYLENE GLYCOL (MIRALAX / GLYCOLAX) PACKET    Take 17 g by mouth daily as needed (for constipation.).    PROBIOTIC PRODUCT (RESTORA) CAPS    Take 1 capsule by mouth daily.  Modified Medications   No medications on file  Discontinued Medications   CIPROFLOXACIN (CIPRO) 500 MG TABLET    Take 1 tablet (500 mg total) by mouth 2 (two) times daily.   ESTRACE VAGINAL 0.1 MG/GM VAGINAL CREAM    Place 1 application vaginally daily as needed. For dryness   TRIMETHOPRIM (TRIMPEX) 100 MG TABLET    Take 100  mg by mouth 2 (two) times daily.     Review of Systems  Constitutional: Positive for activity change and appetite change. Negative for fever, fatigue and unexpected weight change.  HENT: Positive for hearing loss. Negative for ear pain.   Eyes:       History of loss of visual acuity and macular degeneration.  Respiratory:       History of breast discomfort. History of removal of a lump of cancer from the right breast. Dyspnea on exertion.  Cardiovascular: Positive for leg swelling. Negative for chest pain and palpitations.  Gastrointestinal:       She frequently complains that her intestines are not working right. There is a previous history of diverticulosis. Her last colonoscopy was in 2008. She has been seen by Dr. Paulita Fujita in the past. Constipation - she associates with Tramadol  Genitourinary: Positive for frequency, decreased urine volume and  difficulty urinating. Negative for dysuria, vaginal bleeding and vaginal discharge.       Complains of a slow, weak urinary stream. Denies dysuria. Has increased frequency and nocturia. Saw Dr. Louis Meckel, urologist. She was taught self-catheterization for a bladder that does not empty well. Vaginal discomfort. Managed by Dr. Claudean Kinds. History of elevated CEA 125. Normal ultrasound of the abdomen and genitourinary tract.  Musculoskeletal:       Chronic back pain.  Patient has a shorter right leg and has a lift in the right shoe. Now seeing Dr. Rhona Raider.  Right hip arthroplasty 5/16 There is generalized stiffness in the joints. There is a history of spinal stenosis. Left third finger catches on extension sometimes. There is a trigger finger. Pain at the left iliac crest and left anterior ribs near the sternum.  Skin:       History of skin discoloration and changes of nails.  Rash on the left scapula, right forearm, and left forearm.  Allergic/Immunologic: Negative.   Neurological: Negative for dizziness, tremors, seizures and numbness.       History of spinal stenosis with neurogenic claudication affecting the right leg. Complains of pain in the buttocks that is suggestive of neuralgia from her spinal stenosis.  Hematological: Bruises/bleeds easily.  Psychiatric/Behavioral: Negative.     Filed Vitals:   07/29/15 0900  BP: 142/72  Pulse: 79  Temp: 97.6 F (36.4 C)  TempSrc: Oral  Height: 5' 1" (1.549 m)  Weight: 139 lb (63.05 kg)  SpO2: 99%   Wt Readings from Last 3 Encounters:  07/29/15 139 lb (63.05 kg)  07/03/15 140 lb (63.504 kg)  02/23/15 137 lb (62.143 kg)    Body mass index is 26.28 kg/(m^2).  Physical Exam  Constitutional: She is oriented to person, place, and time. She appears well-developed and well-nourished. No distress.  HENT:  HOH  Eyes:  Corrective lenses.  Neck: No JVD present. No tracheal deviation present. No thyromegaly present.  Cardiovascular: Normal  rate, regular rhythm and normal heart sounds.  Exam reveals no gallop and no friction rub.   No murmur heard. Pulses in RLE not palpable, has 2+ edema which could be obscuring pulses  Pulmonary/Chest: Effort normal and breath sounds normal. She has no rales.  Abdominal: Soft. Bowel sounds are normal. She exhibits no distension and no mass. There is no tenderness.  Musculoskeletal: Normal range of motion. She exhibits edema (RLE). She exhibits no tenderness.  Poor balance; using walker with PT following right hip replacement  Lymphadenopathy:    She has no cervical adenopathy.  Neurological: She is alert and oriented to  person, place, and time. No cranial nerve deficit.  Skin: Skin is warm and dry. There is pallor.  Ovoid reddish patch at the left scapula. I am unable to find any blisters on this patch. Right forearm has a larger firm lump which has a reddish covering. Nontender. Left forearm has a reddish patch, but no lump of tissue under the patch.  Psychiatric: She has a normal mood and affect. Her behavior is normal. Thought content normal.     Labs reviewed: Lab Summary Latest Ref Rng 07/24/2015 07/03/2015 02/24/2015  Hemoglobin 12.0 - 15.0 g/dL (None) (None) 12.2  Hematocrit 36.0 - 46.0 % (None) (None) 37.6  White count 4.0 - 10.5 K/uL (None) (None) 9.3  Platelet count 150 - 400 K/uL (None) (None) 197  Sodium 137 - 147 mmol/L 138 131(L) 139  Potassium 3.4 - 5.3 mmol/L 5.1 4.8 4.3  Calcium 8.6 - 10.4 mg/dL (None) 8.0(L) 9.2  Phosphorus - (None) (None) (None)  Creatinine 0.5 - 1.1 mg/dL 0.9 0.86 0.90  AST 13 - 35 U/L _0 Alk Phos 25 - 125 U/L 120 119 98  Bilirubin 0.2 - 1.2 mg/dL (None) 0.5 0.6  Glucose - 90 98 153(H)  Cholesterol - (None) (None) (None)  HDL cholesterol - (None) (None) (None)  Triglycerides - (None) (None) (None)  LDL Direct - (None) (None) (None)  LDL Calc - (None) (None) (None)  Total protein 6.1 - 8.1 g/dL (None) 6.8 6.5  Albumin 3.6 - 5.1 g/dL (None)  4.3 3.7   Lab Results  Component Value Date   TSH 5.349* 05/30/2014   Lab Results  Component Value Date   BUN 15 07/24/2015   BUN 11 07/03/2015   BUN 22* 02/24/2015   Lab Results  Component Value Date   CREATININE 0.9 07/24/2015   CREATININE 0.86 07/03/2015   CREATININE 0.90 02/24/2015   Lab Results  Component Value Date   HGBA1C 5.9 01/23/2015       Assessment/Plan  1. Urinary tract infection without hematuria, site unspecified Resolved  2. Insomnia Continues. Using zolpidem sometimes.  3. Hyponatremia Resolved  4. Hyperglycemia Last glucose was normal  5. Herpes simplex Possible etiology of the left scapular patch, since this seems to have been a recurrent issue according to patient and daughter. Advised her to finish the acyclovir.  6. Depression Discontinued mirtazapine  7. IBS (irritable bowel syndrome) Possible explanation of the right lower quadrant discomfort

## 2015-08-01 DIAGNOSIS — M6281 Muscle weakness (generalized): Secondary | ICD-10-CM | POA: Diagnosis not present

## 2015-08-01 DIAGNOSIS — M545 Low back pain: Secondary | ICD-10-CM | POA: Diagnosis not present

## 2015-08-05 DIAGNOSIS — M6281 Muscle weakness (generalized): Secondary | ICD-10-CM | POA: Diagnosis not present

## 2015-08-05 DIAGNOSIS — M545 Low back pain: Secondary | ICD-10-CM | POA: Diagnosis not present

## 2015-08-07 DIAGNOSIS — M545 Low back pain: Secondary | ICD-10-CM | POA: Diagnosis not present

## 2015-08-07 DIAGNOSIS — M6281 Muscle weakness (generalized): Secondary | ICD-10-CM | POA: Diagnosis not present

## 2015-08-11 ENCOUNTER — Telehealth: Payer: Self-pay | Admitting: Internal Medicine

## 2015-08-11 DIAGNOSIS — Z853 Personal history of malignant neoplasm of breast: Secondary | ICD-10-CM | POA: Diagnosis not present

## 2015-08-11 DIAGNOSIS — Z1231 Encounter for screening mammogram for malignant neoplasm of breast: Secondary | ICD-10-CM | POA: Diagnosis not present

## 2015-08-11 NOTE — Telephone Encounter (Signed)
Handicap Placard filled out and given to Dr. Nyoka Cowden to review and sign. Wilford Grist, daughter would like mailed back to her after signature. Postage pain envelope attached.

## 2015-08-12 DIAGNOSIS — M545 Low back pain: Secondary | ICD-10-CM | POA: Diagnosis not present

## 2015-08-12 DIAGNOSIS — M6281 Muscle weakness (generalized): Secondary | ICD-10-CM | POA: Diagnosis not present

## 2015-08-15 DIAGNOSIS — M6281 Muscle weakness (generalized): Secondary | ICD-10-CM | POA: Diagnosis not present

## 2015-08-15 DIAGNOSIS — M545 Low back pain: Secondary | ICD-10-CM | POA: Diagnosis not present

## 2015-08-18 DIAGNOSIS — I739 Peripheral vascular disease, unspecified: Secondary | ICD-10-CM | POA: Diagnosis not present

## 2015-08-18 DIAGNOSIS — L603 Nail dystrophy: Secondary | ICD-10-CM | POA: Diagnosis not present

## 2015-08-19 DIAGNOSIS — M545 Low back pain: Secondary | ICD-10-CM | POA: Diagnosis not present

## 2015-08-19 DIAGNOSIS — M6281 Muscle weakness (generalized): Secondary | ICD-10-CM | POA: Diagnosis not present

## 2015-08-22 ENCOUNTER — Encounter: Payer: Self-pay | Admitting: Internal Medicine

## 2015-08-24 ENCOUNTER — Observation Stay (HOSPITAL_COMMUNITY)
Admission: EM | Admit: 2015-08-24 | Discharge: 2015-08-25 | Disposition: A | Discharge status: Home / Self Care | Payer: Medicare Other | Attending: Internal Medicine | Admitting: Internal Medicine

## 2015-08-24 ENCOUNTER — Encounter (HOSPITAL_COMMUNITY): Payer: Self-pay | Admitting: Emergency Medicine

## 2015-08-24 ENCOUNTER — Observation Stay (HOSPITAL_COMMUNITY): Payer: Medicare Other

## 2015-08-24 ENCOUNTER — Emergency Department (HOSPITAL_COMMUNITY): Payer: Medicare Other

## 2015-08-24 DIAGNOSIS — M810 Age-related osteoporosis without current pathological fracture: Secondary | ICD-10-CM | POA: Diagnosis present

## 2015-08-24 DIAGNOSIS — H8110 Benign paroxysmal vertigo, unspecified ear: Principal | ICD-10-CM

## 2015-08-24 DIAGNOSIS — R42 Dizziness and giddiness: Secondary | ICD-10-CM

## 2015-08-24 DIAGNOSIS — H9193 Unspecified hearing loss, bilateral: Secondary | ICD-10-CM | POA: Insufficient documentation

## 2015-08-24 DIAGNOSIS — R339 Retention of urine, unspecified: Secondary | ICD-10-CM | POA: Diagnosis not present

## 2015-08-24 DIAGNOSIS — Z85828 Personal history of other malignant neoplasm of skin: Secondary | ICD-10-CM | POA: Insufficient documentation

## 2015-08-24 DIAGNOSIS — R0602 Shortness of breath: Secondary | ICD-10-CM | POA: Diagnosis not present

## 2015-08-24 DIAGNOSIS — H353190 Nonexudative age-related macular degeneration, unspecified eye, stage unspecified: Secondary | ICD-10-CM | POA: Insufficient documentation

## 2015-08-24 DIAGNOSIS — Z7982 Long term (current) use of aspirin: Secondary | ICD-10-CM | POA: Diagnosis not present

## 2015-08-24 DIAGNOSIS — H353290 Exudative age-related macular degeneration, unspecified eye, stage unspecified: Secondary | ICD-10-CM | POA: Diagnosis present

## 2015-08-24 DIAGNOSIS — Z87440 Personal history of urinary (tract) infections: Secondary | ICD-10-CM | POA: Diagnosis not present

## 2015-08-24 DIAGNOSIS — E871 Hypo-osmolality and hyponatremia: Secondary | ICD-10-CM | POA: Diagnosis present

## 2015-08-24 DIAGNOSIS — N390 Urinary tract infection, site not specified: Secondary | ICD-10-CM | POA: Diagnosis not present

## 2015-08-24 DIAGNOSIS — Z96641 Presence of right artificial hip joint: Secondary | ICD-10-CM | POA: Diagnosis not present

## 2015-08-24 DIAGNOSIS — C50919 Malignant neoplasm of unspecified site of unspecified female breast: Secondary | ICD-10-CM | POA: Diagnosis present

## 2015-08-24 DIAGNOSIS — R55 Syncope and collapse: Secondary | ICD-10-CM | POA: Diagnosis present

## 2015-08-24 DIAGNOSIS — Z79899 Other long term (current) drug therapy: Secondary | ICD-10-CM | POA: Diagnosis not present

## 2015-08-24 DIAGNOSIS — M1611 Unilateral primary osteoarthritis, right hip: Secondary | ICD-10-CM | POA: Diagnosis present

## 2015-08-24 DIAGNOSIS — Z853 Personal history of malignant neoplasm of breast: Secondary | ICD-10-CM | POA: Insufficient documentation

## 2015-08-24 LAB — URINALYSIS, ROUTINE W REFLEX MICROSCOPIC
BILIRUBIN URINE: NEGATIVE
Glucose, UA: NEGATIVE mg/dL
Ketones, ur: NEGATIVE mg/dL
NITRITE: NEGATIVE
Protein, ur: NEGATIVE mg/dL
SPECIFIC GRAVITY, URINE: 1.008 (ref 1.005–1.030)
pH: 7 (ref 5.0–8.0)

## 2015-08-24 LAB — CBC WITH DIFFERENTIAL/PLATELET
BASOS PCT: 0 %
Basophils Absolute: 0 10*3/uL (ref 0.0–0.1)
EOS ABS: 0.1 10*3/uL (ref 0.0–0.7)
Eosinophils Relative: 1 %
HEMATOCRIT: 36.9 % (ref 36.0–46.0)
HEMOGLOBIN: 12.4 g/dL (ref 12.0–15.0)
LYMPHS ABS: 1.9 10*3/uL (ref 0.7–4.0)
Lymphocytes Relative: 25 %
MCH: 30.8 pg (ref 26.0–34.0)
MCHC: 33.6 g/dL (ref 30.0–36.0)
MCV: 91.8 fL (ref 78.0–100.0)
MONO ABS: 0.6 10*3/uL (ref 0.1–1.0)
MONOS PCT: 7 %
Neutro Abs: 5.2 10*3/uL (ref 1.7–7.7)
Neutrophils Relative %: 67 %
Platelets: 235 10*3/uL (ref 150–400)
RBC: 4.02 MIL/uL (ref 3.87–5.11)
RDW: 14.5 % (ref 11.5–15.5)
WBC: 7.8 10*3/uL (ref 4.0–10.5)

## 2015-08-24 LAB — COMPREHENSIVE METABOLIC PANEL
ALBUMIN: 4.1 g/dL (ref 3.5–5.0)
ALK PHOS: 119 U/L (ref 38–126)
ALT: 13 U/L — ABNORMAL LOW (ref 14–54)
ANION GAP: 8 (ref 5–15)
AST: 24 U/L (ref 15–41)
BUN: 11 mg/dL (ref 6–20)
CALCIUM: 9.3 mg/dL (ref 8.9–10.3)
CO2: 25 mmol/L (ref 22–32)
Chloride: 96 mmol/L — ABNORMAL LOW (ref 101–111)
Creatinine, Ser: 0.71 mg/dL (ref 0.44–1.00)
GFR calc Af Amer: >60 mL/min (ref >60)
GFR calc non Af Amer: >60 mL/min (ref >60)
GLUCOSE: 132 mg/dL — AB (ref 65–99)
POTASSIUM: 4 mmol/L (ref 3.5–5.1)
SODIUM: 129 mmol/L — AB (ref 135–145)
Total Bilirubin: 0.5 mg/dL (ref 0.3–1.2)
Total Protein: 6.9 g/dL (ref 6.5–8.1)

## 2015-08-24 LAB — I-STAT TROPONIN, ED: TROPONIN I, POC: 0.01 ng/mL (ref 0.00–0.08)

## 2015-08-24 LAB — URINE MICROSCOPIC-ADD ON

## 2015-08-24 LAB — OSMOLALITY: OSMOLALITY: 278 mosm/kg (ref 275–295)

## 2015-08-24 LAB — URIC ACID: Uric Acid, Serum: 4 mg/dL (ref 2.3–6.6)

## 2015-08-24 MED ORDER — SODIUM CHLORIDE 0.9 % IV BOLUS (SEPSIS): 500.0000 mL | Freq: Once | INTRAVENOUS | Status: DC

## 2015-08-24 MED ORDER — POLYETHYLENE GLYCOL 3350 17 G PO PACK: 17.0000 g | PACK | Freq: Every day | ORAL | Status: DC | PRN

## 2015-08-24 MED ORDER — SODIUM CHLORIDE 0.9 % IV SOLN
INTRAVENOUS | Status: DC
  Administered 2015-08-24: 17:00:00 via INTRAVENOUS

## 2015-08-24 MED ORDER — HYDROCODONE-ACETAMINOPHEN 5-325 MG PO TABS: 1.0000 | ORAL_TABLET | Freq: Four times a day (QID) | ORAL | Status: DC | PRN

## 2015-08-24 MED ORDER — ACETAMINOPHEN 500 MG PO TABS
1000.0000 mg | ORAL_TABLET | Freq: Four times a day (QID) | ORAL | Status: DC | PRN
  Administered 2015-08-25 (×2): 1000 mg via ORAL
  Filled 2015-08-24 (×2): qty 2

## 2015-08-24 MED ORDER — SODIUM CHLORIDE 0.9% FLUSH
3.0000 mL | Freq: Two times a day (BID) | INTRAVENOUS | Status: DC
  Administered 2015-08-25: 3 mL via INTRAVENOUS

## 2015-08-24 MED ORDER — RISAQUAD PO CAPS
1.0000 | ORAL_CAPSULE | Freq: Every day | ORAL | Status: DC
  Administered 2015-08-25: 1 via ORAL
  Filled 2015-08-24: qty 1

## 2015-08-24 MED ORDER — DEXTROSE 5 % IV SOLN
1.0000 g | Freq: Once | INTRAVENOUS | Status: AC
  Administered 2015-08-24: 1 g via INTRAVENOUS
  Filled 2015-08-24: qty 10

## 2015-08-24 MED ORDER — ONDANSETRON HCL 4 MG PO TABS
4.0000 mg | ORAL_TABLET | Freq: Four times a day (QID) | ORAL | Status: DC | PRN
  Administered 2015-08-25: 4 mg via ORAL
  Filled 2015-08-24: qty 1

## 2015-08-24 MED ORDER — ZOLPIDEM TARTRATE 5 MG PO TABS: 5.0000 mg | ORAL_TABLET | Freq: Every evening | ORAL | Status: DC | PRN

## 2015-08-24 MED ORDER — NITROFURANTOIN MACROCRYSTAL 50 MG PO CAPS
50.0000 mg | ORAL_CAPSULE | Freq: Every day | ORAL | Status: DC
  Administered 2015-08-24: 50 mg via ORAL
  Filled 2015-08-24: qty 1

## 2015-08-24 MED ORDER — HEPARIN SODIUM (PORCINE) 5000 UNIT/ML IJ SOLN
5000.0000 [IU] | Freq: Three times a day (TID) | INTRAMUSCULAR | Status: DC
  Administered 2015-08-24 – 2015-08-25 (×2): 5000 [IU] via SUBCUTANEOUS
  Filled 2015-08-24 (×2): qty 1

## 2015-08-24 MED ORDER — OCUVITE-LUTEIN PO CAPS
1.0000 | ORAL_CAPSULE | Freq: Two times a day (BID) | ORAL | Status: DC
  Administered 2015-08-24 – 2015-08-25 (×2): 1 via ORAL
  Filled 2015-08-24 (×2): qty 1

## 2015-08-24 MED ORDER — ONDANSETRON HCL 4 MG/2ML IJ SOLN: 4.0000 mg | Freq: Four times a day (QID) | INTRAMUSCULAR | Status: DC | PRN

## 2015-08-24 MED ORDER — SODIUM CHLORIDE 0.9 % IV SOLN: INTRAVENOUS | Status: DC

## 2015-08-24 MED ORDER — ASPIRIN 81 MG PO CHEW
81.0000 mg | CHEWABLE_TABLET | Freq: Every day | ORAL | Status: DC
  Administered 2015-08-25: 81 mg via ORAL
  Filled 2015-08-24: qty 1

## 2015-08-24 NOTE — ED Provider Notes (Signed)
Emergency Department Provider Note   I have reviewed the triage vital signs and the nursing notes.   HISTORY  Chief Complaint Dizziness   HPI Cassandra Glenn is a 82 y.o. female with PMH of macular degeneration, arthritis, cancer history, and frequent UTI presents to the emergency department for evaluation of persistent vertigo. The patient awoke this morning and immediately began having vertigo. She reports some mild worsening of symptoms with movement but this isn't consistent. She was able to walk and begin some of her morning activities but reports persistent mild vertigo. No weakness in her arms or legs. She's had prior episodes of vertigo but these did not require MD evaluation. No history of head injury, neck injury, fevers or shaking chills. Patient has no headache. No chest pain. Patient was able to take her morning medications with no vomiting.   Past Medical History:  Diagnosis Date  . Abnormalities of the hair 02/29/2012  . Arthritis   . Cancer ([place])   . Candidiasis of skin and nails 03/19/2011  . Closed fracture of base of neck of femur ([place]) 03/18/2011  . Contact dermatitis and other eczema, due to unspecified cause 05/07/2011  . Depression 07/23/2014  . Diverticulosis of colon (without mention of hemorrhage) 03/19/2011  . Edema 05/07/2011  . HOH (hard of hearing)   . Hypopotassemia 03/26/2011  . Hyposmolality and/or hyponatremia 03/19/2011  . Hypotension, unspecified 03/18/2011  . Impaired fasting glucose 03/19/2011  . Insomnia, unspecified 03/19/2011  . Kidney infection   . Lumbago 03/19/2011  . Macular degeneration   . Malignant neoplasm of breast (female), unspecified site 10/28/2002  . Osteoporosis, unspecified 03/19/2011  . Other malaise and fatigue 03/19/2011  . Pain in joint, pelvic region and thigh 03/19/2011  . Right bundle branch block 08/30/2011  . Shortness of breath   . Spasm of muscle 03/19/2011  . Spinal stenosis, lumbar region, with  neurogenic claudication 11/02/2011  . Trigger finger (acquired) 11/02/2011  . Unspecified constipation 03/19/2011  . Vaginitis and vulvovaginitis 02/29/2012    Patient Active Problem List   Diagnosis Date Noted  . Syncope 08/24/2015  . Lightheadedness   . Herpes simplex 07/29/2015  . Chorioretinal scar, macular 01/23/2015  . Osteoporosis 08/06/2014  . Depression 07/23/2014  . Candidiasis of skin 06/25/2014  . Constipation 06/04/2014  . Urinary retention 05/30/2014  . Primary osteoarthritis of right hip 05/28/2014  . Cancer antigen 125 (CA 125) elevation 11/27/2013  . IBS (irritable bowel syndrome) 11/27/2013  . Purpura senilis ([place]) 07/31/2013  . TMJ click [id number]  . Insomnia 04/24/2013  . Urinary frequency 04/24/2013  . Hyponatremia 04/19/2013  . Hyperglycemia 04/19/2013  . History of fall 04/17/2013  . UTI (urinary tract infection) 04/16/2013  . Abnormal CT scan, chest 03/23/2013  . Compression fracture of thoracic vertebra ([facility]) 11/24/2012  . AMD (age-related macular degeneration), wet ([place]) 05/27/2011  . History of surgical procedure 05/27/2011  . Closed fracture of base of neck of femur ([place]) 03/18/2011  . 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 Nonexudative age-related macular degeneration 12/10/2010  . Posterior vitreous detachment 12/10/2010  . Cancer of breast, female ([place]) 10/27/2004    Past Surgical History:  Procedure Laterality Date  . BREAST SURGERY Bilateral 2005-10/27/2004   lumpectomy- Streck,MD  . CATARACT EXTRACTION  2010   bialteral  . EYE SURGERY     cataract  . FRACTURE SURGERY Left 09/1980   ankle  . HARDWARE REMOVAL Right 05/28/2014   Procedure: HARDWARE REMOVAL;  Surgeon: Melrose Nakayama, MD;  Location:  [facility];  Service: Orthopedics;  Laterality: Right;  . HIP PINNING,CANNULATED  03/08/2011   Procedure: CANNULATED HIP PINNING;  Surgeon: Johnn Hai, MD;  Location: [facility];  Service: Orthopedics;  Laterality: Right;  . ORIF HIP FRACTURE  Right 03/08/2011   Bean,MD  . SKIN CANCER EXCISION Left 10/12/2012   lower leg Dr. [place]  . SKIN LESION EXCISION Right 02/04/2011   Abdomen lesion spongiotic dermatitis-Taffeen, MD  . SQUAMOUS CELL CARCINOMA EXCISION Right 02/04/2011   forearmSyble Creek, MD  . TONSILLECTOMY    . TOTAL HIP ARTHROPLASTY Right 05/28/2014   Procedure: TOTAL HIP ARTHROPLASTY ANTERIOR APPROACH;  Surgeon: Melrose Nakayama, MD;  Location: [facility];  Service: Orthopedics;  Laterality: Right;      Allergies Review of patient's allergies indicates no known allergies.  Family History  Problem Relation Age of Onset  . Heart disease Mother   . Cancer Father   . Emphysema Brother   . Alzheimer's disease Brother     Social History Social History  Substance Use Topics  . Smoking status: Never Smoker  . Smokeless tobacco: Never Used  . Alcohol use 0.0 oz/week     Comment: occasional glass    Review of Systems  Constitutional: No fever/chills Eyes: No visual changes. ENT: No sore throat. Positive vertigo.  Cardiovascular: Denies chest pain. Respiratory: Denies shortness of breath. Gastrointestinal: No abdominal pain. No nausea, no vomiting.  No diarrhea.  No constipation. Genitourinary: Negative for dysuria. Musculoskeletal: Negative for back pain. Skin: Negative for rash. Neurological: Negative for headaches, focal weakness or numbness.  10-point ROS otherwise negative.  ____________________________________________   PHYSICAL EXAM:  VITAL SIGNS: ED Triage Vitals [08/24/15 1026]  Enc Vitals Group     BP 168/70     Pulse Rate 81     Resp 16     Temp 97.7 F (36.5 C)     Temp Source Oral     SpO2 100 %     Pain Score 0   Constitutional: Alert and oriented. Well appearing and in no acute distress. Eyes: Conjunctivae are normal. PERRL. EOMI. Head: Atraumatic. Nose: No congestion/rhinnorhea. Mouth/Throat: Mucous membranes are moist.  Oropharynx non-erythematous. Neck: No stridor.  No  meningeal signs. No cervical spine tenderness to palpation. Cardiovascular: Normal rate, regular rhythm. Good peripheral circulation. Grossly normal heart sounds.   Respiratory: Normal respiratory effort.  No retractions. Lungs CTAB. Gastrointestinal: Soft and nontender. No distention.  Musculoskeletal: No lower extremity tenderness nor edema. No gross deformities of extremities. Neurologic:  Normal speech and language. Some gait instability. Normal finger-to-nose testing, normal rapid alternating movements, Normal heel to shin. No CN findings. No gross motor or sensory deficits.  Skin:  Skin is warm, dry and intact. No rash noted. Psychiatric: Mood and affect are normal. Speech and behavior are normal.  ____________________________________________   LABS (all labs ordered are listed, but only abnormal results are displayed)  Labs Reviewed  URINALYSIS, ROUTINE W REFLEX MICROSCOPIC (NOT AT [facility]) - Abnormal; Notable for the following:       Result Value   APPearance CLOUDY (*)    Hgb urine dipstick SMALL (*)    Leukocytes, UA LARGE (*)    All other components within normal limits  COMPREHENSIVE METABOLIC PANEL - Abnormal; Notable for the following:    Sodium 129 (*)    Chloride 96 (*)    Glucose, Bld 132 (*)    ALT 13 (*)    All other components within normal limits  URINE MICROSCOPIC-ADD ON -  Abnormal; Notable for the following:    Squamous Epithelial / LPF 6-30 (*)    Bacteria, UA FEW (*)    All other components within normal limits  CBC WITH DIFFERENTIAL/PLATELET  URIC ACID  SODIUM, URINE, RANDOM  OSMOLALITY  OSMOLALITY, URINE  BASIC METABOLIC PANEL  TSH  I-STAT TROPOININ, ED   ____________________________________________  EKG  Reviewed in MUSE. No STEMI.  ____________________________________________  RADIOLOGY  Mr Brain Wo Contrast  Result Date: 08/24/2015 CLINICAL DATA:  Vertigo EXAM: MRI HEAD WITHOUT CONTRAST TECHNIQUE: Multiplanar, multiecho pulse sequences of  the brain and surrounding structures were obtained without intravenous contrast. COMPARISON:  CT head 05/22/2014 FINDINGS: Mild cerebral atrophy.  Negative for hydrocephalus. Negative for acute infarct. Moderate chronic microvascular ischemic change in the white matter, thalamus, and pons bilaterally. Negative for hemorrhage or fluid collection. Negative for mass or edema.  No shift of the midline structures Mild mucosal edema paranasal sinuses. No orbital mass. Bilateral lens replacement. Pituitary normal in size IMPRESSION: Atrophy and chronic ischemic change.  No acute abnormality. Electronically Signed   By: Franchot Gallo M.D.   On: 08/24/2015 14:52   Dg Chest Port 1 View  Result Date: 08/24/2015 CLINICAL DATA:  Short of breath EXAM: PORTABLE CHEST 1 VIEW COMPARISON:  05/22/2014 FINDINGS: COPD with hyperinflation. Lungs are clear without an from without infiltrate or effusion. Heart size normal. Negative for heart failure. Apical scarring bilaterally. IMPRESSION: Chronic lung disease with apical scarring. No acute cardiopulmonary abnormality. Electronically Signed   By: Franchot Gallo M.D.   On: 08/24/2015 16:17    ____________________________________________   PROCEDURES  Procedure(s) performed:   Procedures  None ____________________________________________   INITIAL IMPRESSION / ASSESSMENT AND PLAN / ED COURSE  Pertinent labs & imaging results that were available during my care of the patient were reviewed by me and considered in my medical decision making (see chart for details).  Patient presents to the emergency department for evaluation of persistent vertigo since waking up this morning. Vertigo not made significantly worse with movement. Negative Dicks Hallpike. Patient has some gait instability with persistent symptoms. No obvious nystagmus. Patient has no prior history of stroke. No neck pain or chest pain to suggest dissection. I do have some concern for possible central  vertigo given inconsistent exam and gait instability. Plan to obtain labs and MRI of the brain to evaluate for posterior circulation stroke. Discussed with patient and daughter at bedside.   03:09 PM Patient with no acute stroke on MRI. Vertigo has resolved. She remains lightheaded and fatigued. Patient with some evidence of urinary tract infection with hyponatremia as well. Plan for IV fluids, antibiotics, admission for correction of hyponatremia.   Discussed patient's case with hospitalist, Dr. Candiss Norse.  Recommend admission to obs, telemetry bed.  I will place holding orders per their request. Patient and family (if present) updated with plan. Care transferred to hospitalist service.  I reviewed all nursing notes, vitals, pertinent old records, EKGs, labs, imaging (as available).   ____________________________________________  FINAL CLINICAL IMPRESSION(S) / ED DIAGNOSES  Final diagnoses:  Vertigo  Lightheadedness  Hyponatremia  UTI (lower urinary tract infection)     MEDICATIONS GIVEN DURING THIS VISIT:  Medications  cefTRIAXone (ROCEPHIN) 1 g in dextrose 5 % 50 mL IVPB (not administered)  sodium chloride 0.9 % bolus 500 mL (not administered)  acetaminophen (TYLENOL) tablet 1,000 mg (not administered)  aspirin chewable tablet 81 mg (not administered)  acidophilus (RISAQUAD) capsule 1 capsule (not administered)  zolpidem (AMBIEN) tablet 5 mg (  not administered)  multivitamin-lutein (OCUVITE-LUTEIN) capsule 1 capsule (not administered)  nitrofurantoin (MACRODANTIN) capsule 50 mg (not administered)  ondansetron (ZOFRAN) tablet 4 mg (not administered)    Or  ondansetron (ZOFRAN) injection 4 mg (not administered)  heparin injection 5,000 Units (not administered)  sodium chloride flush (NS) 0.9 % injection 3 mL (not administered)  HYDROcodone-acetaminophen (NORCO/VICODIN) 5-325 MG per tablet 1 tablet (not administered)  polyethylene glycol (MIRALAX / GLYCOLAX) packet 17 g (not  administered)  0.9 %  sodium chloride infusion ( Intravenous New Bag/Given 08/24/15 1700)     NEW OUTPATIENT MEDICATIONS STARTED DURING THIS VISIT:  None   Note:  This document was prepared using Dragon voice recognition software and may include unintentional dictation errors.  Nanda Quinton, MD Emergency Medicine   Margette Fast, MD 08/24/15 [phone]

## 2015-08-24 NOTE — H&P (Signed)
TRH H&P   Patient Demographics:    Cassandra Glenn, is a 82 y.o. female  MRN: [id number]   DOB - 11-Mar-1920  Admit Date - 08/24/2015  Outpatient Primary MD for the patient is Jeanmarie Hubert, MD  Patient coming from: [place]  Chief Complaint  Patient presents with  . Dizziness      HPI:    Cassandra Glenn  is a 82 y.o. female,  History of macular degeneration, bilateral hearing loss, osteoporosis, chronic hyponatremia reason unclear in the chart, breast cancer, right bundle branch block, urinary retention requiring twice a day self-catheterization, chronic colonization of urine with bacteria takes nitrofurantoin on a chronic basis who lives in independent living facility comes in after she woke up this morning and felt a sensation of spinning while she was in the bed.  She came to the ER where MRI brain was done along with routine EKG and blood work were unremarkable except mild hyponatremia which was lower than her last baseline, UA suggestive of colonization, I was called to admit the patient. Currently completely negative review of systems.    Review of systems:    In addition to the HPI above,   No Fever-chills, No Headache, No changes with Vision or hearing, No problems swallowing food or Liquids, No Chest pain, Cough or Shortness of Breath, No Abdominal pain, No Nausea or Vommitting, Bowel movements are regular, No Blood in stool or Urine, No dysuria, No new skin rashes or bruises, No new joints pains-aches,  No new weakness, tingling, numbness in any extremity, No recent weight gain or loss, No polyuria, polydypsia or polyphagia, No significant Mental Stressors.  A full 10 point Review of  Systems was done, except as stated above, all other Review of Systems were negative.   With Past History of the following :    Past Medical History:  Diagnosis Date  . Abnormalities of the hair 02/29/2012  . Arthritis   . Cancer ([place])   . Candidiasis of skin and nails 03/19/2011  . Closed fracture of base of neck of femur ([place]) 03/18/2011  . Contact dermatitis and other eczema, due to unspecified cause 05/07/2011  . Depression 07/23/2014  . Diverticulosis of colon (without mention of hemorrhage) 03/19/2011  . Edema 05/07/2011  . HOH (hard of hearing)   . Hypopotassemia 03/26/2011  . Hyposmolality  and/or hyponatremia 03/19/2011  . Hypotension, unspecified 03/18/2011  . Impaired fasting glucose 03/19/2011  . Insomnia, unspecified 03/19/2011  . Kidney infection   . Lumbago 03/19/2011  . Macular degeneration   . Malignant neoplasm of breast (female), unspecified site 10/28/2002  . Osteoporosis, unspecified 03/19/2011  . Other malaise and fatigue 03/19/2011  . Pain in joint, pelvic region and thigh 03/19/2011  . Right bundle branch block 08/30/2011  . Shortness of breath   . Spasm of muscle 03/19/2011  . Spinal stenosis, lumbar region, with neurogenic claudication 11/02/2011  . Trigger finger (acquired) 11/02/2011  . Unspecified constipation 03/19/2011  . Vaginitis and vulvovaginitis 02/29/2012      Past Surgical History:  Procedure Laterality Date  . BREAST SURGERY Bilateral 2005-10/27/2004   lumpectomy- Streck,MD  . CATARACT EXTRACTION  2010   bialteral  . EYE SURGERY     cataract  . FRACTURE SURGERY Left 09/1980   ankle  . HARDWARE REMOVAL Right 05/28/2014   Procedure: HARDWARE REMOVAL;  Surgeon: Melrose Nakayama, MD;  Location: [facility];  Service: Orthopedics;  Laterality: Right;  . HIP PINNING,CANNULATED  03/08/2011   Procedure: CANNULATED HIP PINNING;  Surgeon: Johnn Hai, MD;  Location: [facility];  Service: Orthopedics;  Laterality: Right;  . ORIF HIP FRACTURE Right  03/08/2011   Bean,MD  . SKIN CANCER EXCISION Left 10/12/2012   lower leg Dr. [place]  . SKIN LESION EXCISION Right 02/04/2011   Abdomen lesion spongiotic dermatitis-Taffeen, MD  . SQUAMOUS CELL CARCINOMA EXCISION Right 02/04/2011   forearmSyble Creek, MD  . TONSILLECTOMY    . TOTAL HIP ARTHROPLASTY Right 05/28/2014   Procedure: TOTAL HIP ARTHROPLASTY ANTERIOR APPROACH;  Surgeon: Melrose Nakayama, MD;  Location: [facility];  Service: Orthopedics;  Laterality: Right;      Social History:     Social History  Substance Use Topics  . Smoking status: Never Smoker  . Smokeless tobacco: Never Used  . Alcohol use 0.0 oz/week     Comment: occasional glass         Family History :     Family History  Problem Relation Age of Onset  . Heart disease Mother   . Cancer Father   . Emphysema Brother   . Alzheimer's disease Brother        Home Medications:   Prior to Admission medications   Medication Sig Start Date End Date Taking? Authorizing Provider  acetaminophen (TYLENOL) 500 MG tablet Take 1,000 mg by mouth every 6 (six) hours as needed for moderate pain.    Yes Historical Provider, MD  aspirin 81 MG chewable tablet Chew 81 mg by mouth daily.    Yes Historical Provider, MD  Bilberry 1000 MG CAPS Take 1,000 mg by mouth daily.    Yes Historical Provider, MD  Calcium Carbonate-Vitamin D (CALCIUM 600/VITAMIN D PO) Take 600 mg by mouth 2 (two) times daily.    Yes Historical Provider, MD  Calcium Polycarbophil (FIBERCON PO) Take 1 tablet by mouth 2 (two) times daily.    Yes Historical Provider, MD  cholecalciferol (VITAMIN D) 400 UNITS TABS Take 400 Units by mouth daily.    Yes Historical Provider, MD  CRANBERRY EXTRACT PO Take 1 tablet by mouth daily.   Yes Historical Provider, MD  fish oil-omega-3 fatty acids 1000 MG capsule Take 1 g by mouth daily.    Yes Historical Provider, MD  Glucosamine-Chondroitin (GLUCOSAMINE CHONDR COMPLEX PO) Take 1 tablet by mouth 2 (two) times daily. for joints    Yes Historical  Provider, MD  Multiple Vitamins-Minerals (PRESERVISION/LUTEIN) CAPS Take 1 capsule by mouth 2 (two) times daily.   Yes Historical Provider, MD  nitrofurantoin (MACRODANTIN) 50 MG capsule Take 50 mg by mouth at bedtime.   Yes Historical Provider, MD  Probiotic Product (RESTORA) CAPS Take 1 capsule by mouth daily.   Yes Historical Provider, MD  zolpidem (AMBIEN) 5 MG tablet Take one half tablet at bedtime if needed for rest Patient taking differently: Take 5 mg by mouth at bedtime as needed for sleep.  07/29/15  Yes Estill Dooms, MD     Allergies:    No Known Allergies   Physical Exam:   Vitals  Blood pressure 158/58, pulse 61, temperature 97.7 F (36.5 C), temperature source Oral, resp. rate 11, SpO2 100 %.   1. General Pleasant white elderly female lying in bed in NAD,     2. Normal affect and insight, Not Suicidal or Homicidal, Awake Alert, Oriented X 3.  3. No F.N deficits, ALL C.Nerves Intact, Strength 5/5 all 4 extremities, Sensation intact all 4 extremities, Plantars down going.  4. Ears and Eyes appear Normal, Conjunctivae clear, PERRLA. Moist Oral Mucosa.  5. Supple Neck, No JVD, No cervical lymphadenopathy appriciated, No Carotid Bruits.  6. Symmetrical Chest wall movement, Good air movement bilaterally, CTAB.  7. RRR, No Gallops, Rubs or Murmurs, No Parasternal Heave.  8. Positive Bowel Sounds, Abdomen Soft, No tenderness, No organomegaly appriciated,No rebound -guarding or rigidity.  9.  No Cyanosis, Normal Skin Turgor, No Skin Rash or Bruise.  10. Good muscle tone,  joints appear normal , no effusions, Normal ROM.  11. No Palpable Lymph Nodes in Neck or Axillae      Data Review:    CBC  Recent Labs Lab 08/24/15 1109  WBC 7.8  HGB 12.4  HCT 36.9  PLT 235  MCV 91.8  MCH 30.8  MCHC 33.6  RDW 14.5  LYMPHSABS 1.9  MONOABS 0.6  EOSABS 0.1  BASOSABS 0.0    ------------------------------------------------------------------------------------------------------------------  Chemistries   Recent Labs Lab 08/24/15 1109  NA 129*  K 4.0  CL 96*  CO2 25  GLUCOSE 132*  BUN 11  CREATININE 0.71  CALCIUM 9.3  AST 24  ALT 13*  ALKPHOS 119  BILITOT 0.5   ------------------------------------------------------------------------------------------------------------------ CrCl cannot be calculated (Unknown ideal weight.). ------------------------------------------------------------------------------------------------------------------ No results for input(s): TSH, T4TOTAL, T3FREE, THYROIDAB in the last 72 hours.  Invalid input(s): FREET3  Coagulation profile No results for input(s): INR, PROTIME in the last 168 hours. ------------------------------------------------------------------------------------------------------------------- No results for input(s): DDIMER in the last 72 hours. -------------------------------------------------------------------------------------------------------------------  Cardiac Enzymes No results for input(s): CKMB, TROPONINI, MYOGLOBIN in the last 168 hours.  Invalid input(s): CK ------------------------------------------------------------------------------------------------------------------ No results found for: BNP   ---------------------------------------------------------------------------------------------------------------  Urinalysis    Component Value Date/Time   COLORURINE YELLOW 08/24/2015 1101   APPEARANCEUR CLOUDY (A) 08/24/2015 1101   LABSPEC 1.008 08/24/2015 1101   PHURINE 7.0 08/24/2015 1101   GLUCOSEU NEGATIVE 08/24/2015 1101   HGBUR SMALL (A) 08/24/2015 1101   BILIRUBINUR NEGATIVE 08/24/2015 1101   BILIRUBINUR negative 07/03/2015 1208   BILIRUBINUR neg 08/23/2014 1539   KETONESUR NEGATIVE 08/24/2015 1101   PROTEINUR NEGATIVE 08/24/2015 1101   UROBILINOGEN 0.2 07/03/2015 1208    UROBILINOGEN 0.2 05/30/2014 1111   NITRITE NEGATIVE 08/24/2015 1101   LEUKOCYTESUR LARGE (A) 08/24/2015 1101    ----------------------------------------------------------------------------------------------------------------   Imaging Results:    Mr Brain Wo Contrast  Result Date: 08/24/2015 CLINICAL DATA:  Vertigo EXAM: MRI HEAD WITHOUT  CONTRAST TECHNIQUE: Multiplanar, multiecho pulse sequences of the brain and surrounding structures were obtained without intravenous contrast. COMPARISON:  CT head 05/22/2014 FINDINGS: Mild cerebral atrophy.  Negative for hydrocephalus. Negative for acute infarct. Moderate chronic microvascular ischemic change in the white matter, thalamus, and pons bilaterally. Negative for hemorrhage or fluid collection. Negative for mass or edema.  No shift of the midline structures Mild mucosal edema paranasal sinuses. No orbital mass. Bilateral lens replacement. Pituitary normal in size IMPRESSION: Atrophy and chronic ischemic change.  No acute abnormality. Electronically Signed   By: Franchot Gallo M.D.   On: 08/24/2015 14:52    My personal review of EKG: Rhythm NSR, RBBB, LAFB , no Acute ST changes   Assessment & Plan:    1. Vertigo. Unlikely syncope, this happened while she was in the bed, on exam no nystagmus, no ringing in the ears no history of congestive of Mnire's. She has no nausea, she did take 2.5 mg of Ambien around 2 AM however she usually does that on a regular basis. MRI brain is nonacute, this could be mild BPV which has now resolved. We'll keep her on 23 are observation, telemetry, check orthostatics, gentle hydration with free water restriction, check urine sodium, urine osmolality and serum osmole T Caryl Pina is mildly hyponatremic. PT in the morning. If stable discharge home.  2. Hyponatremia persistent on intermittent basis. Review chart but no clear reason cited, she appears euvolemic on exam and this likely is SIADH, urine sodium and osmolality along  with serum osmolality will be checked, gentle hydration for 10 hours with free water restriction. Repeat BMP in the morning.  3. History of urinary retention requiring twice a day self-catheterization and history of chronic urine colonization. I do not think she has UTI, she has no dysuria, no fever or leukocytosis, continue nitrofurantoin, expect cultures to suggest colonization.  4. History of macular degeneration and bilateral hearing loss. Supportive care, continue wearing hearing aids as needed.  5. Osteoporosis. Continue home supplements.   DVT Prophylaxis Heparin    AM Labs Ordered, also please review Full Orders  Family Communication: Admission, patients condition and plan of care including tests being ordered have been discussed with the patient and daughter who indicate understanding and agree with the plan and Code Status.  Code Status Full  Likely DC to  Home 1-2 days  Condition fair  Consults called: None    Admission status: Obs    Time spent in minutes : 30   Lala Lund K M.D on 08/24/2015 at 3:26 PM  Between 7am to 7pm - Pager - [phone]. After 7pm go to [email or web address] - password [facility]  [facility] - Office  [phone]

## 2015-08-24 NOTE — ED Notes (Signed)
Patient transported to MRI 

## 2015-08-24 NOTE — ED Triage Notes (Signed)
Pt reports she woke up with dizziness this am (room spinning sensation). Denies lightheadedness, CP for SOB. Hx of chronic UTIs.

## 2015-08-24 NOTE — Plan of Care (Signed)
Problem: Safety: Goal: Ability to remain free from injury will improve Outcome: Completed/Met Date Met: 08/24/15 Discussed safety prevention plan. Pt is agreeable.

## 2015-08-25 DIAGNOSIS — H8110 Benign paroxysmal vertigo, unspecified ear: Secondary | ICD-10-CM

## 2015-08-25 DIAGNOSIS — M810 Age-related osteoporosis without current pathological fracture: Secondary | ICD-10-CM | POA: Diagnosis not present

## 2015-08-25 DIAGNOSIS — E871 Hypo-osmolality and hyponatremia: Secondary | ICD-10-CM | POA: Diagnosis not present

## 2015-08-25 DIAGNOSIS — C50019 Malignant neoplasm of nipple and areola, unspecified female breast: Secondary | ICD-10-CM | POA: Diagnosis not present

## 2015-08-25 DIAGNOSIS — M6281 Muscle weakness (generalized): Secondary | ICD-10-CM | POA: Diagnosis not present

## 2015-08-25 DIAGNOSIS — M545 Low back pain: Secondary | ICD-10-CM | POA: Diagnosis not present

## 2015-08-25 LAB — BASIC METABOLIC PANEL
Anion gap: 10 (ref 5–15)
BUN: 10 mg/dL (ref 6–20)
CALCIUM: 8.9 mg/dL (ref 8.9–10.3)
CHLORIDE: 103 mmol/L (ref 101–111)
CO2: 22 mmol/L (ref 22–32)
CREATININE: 0.57 mg/dL (ref 0.44–1.00)
GFR calc Af Amer: >60 mL/min (ref >60)
GFR calc non Af Amer: >60 mL/min (ref >60)
Glucose, Bld: 109 mg/dL — ABNORMAL HIGH (ref 65–99)
Potassium: 3.9 mmol/L (ref 3.5–5.1)
SODIUM: 135 mmol/L (ref 135–145)

## 2015-08-25 LAB — SODIUM, URINE, RANDOM: SODIUM UR: 33 mmol/L

## 2015-08-25 LAB — TSH: TSH: 3.215 u[IU]/mL (ref 0.350–4.500)

## 2015-08-25 LAB — OSMOLALITY, URINE: Osmolality, Ur: 198 mOsm/kg — ABNORMAL LOW (ref 300–900)

## 2015-08-25 MED ORDER — MECLIZINE HCL 25 MG PO TABS
25.0000 mg | ORAL_TABLET | Freq: Two times a day (BID) | ORAL | Status: DC
  Administered 2015-08-25: 25 mg via ORAL
  Filled 2015-08-25: qty 1

## 2015-08-25 MED ORDER — MECLIZINE HCL 25 MG PO TABS: 25.0000 mg | ORAL_TABLET | Freq: Three times a day (TID) | ORAL | 0 refills | Status: AC | PRN

## 2015-08-25 NOTE — Discharge Instructions (Signed)
Follow with Primary MD Jeanmarie Hubert, MD in 3-4 days   Get CBC, CMP, 2 view Chest X ray checked  by Primary MD or SNF MD in 5-7 days ( we routinely change or add medications that can affect your baseline labs and fluid status, therefore we recommend that you get the mentioned basic workup next visit with your PCP, your PCP may decide not to get them or add new tests based on their clinical decision)   Activity: As tolerated with Full fall precautions use walker/cane & assistance as needed   Disposition Home    Diet:   Heart Healthy .  For Heart failure patients - Check your Weight same time everyday, if you gain over 2 pounds, or you develop in leg swelling, experience more shortness of breath or chest pain, call your Primary MD immediately. Follow Cardiac Low Salt Diet and 1.5 lit/day fluid restriction.   On your next visit with your primary care physician please Get Medicines reviewed and adjusted.   Please request your Prim.MD to go over all Hospital Tests and Procedure/Radiological results at the follow up, please get all Hospital records sent to your Prim MD by signing hospital release before you go home.   If you experience worsening of your admission symptoms, develop shortness of breath, life threatening emergency, suicidal or homicidal thoughts you must seek medical attention immediately by calling 911 or calling your MD immediately  if symptoms less severe.  You Must read complete instructions/literature along with all the possible adverse reactions/side effects for all the Medicines you take and that have been prescribed to you. Take any new Medicines after you have completely understood and accpet all the possible adverse reactions/side effects.   Do not drive, operate heavy machinery, perform activities at heights, swimming or participation in water activities or provide baby sitting services if your were admitted for syncope or siezures until you have seen by Primary MD or a  Neurologist and advised to do so again.  Do not drive when taking Pain medications.    Do not take more than prescribed Pain, Sleep and Anxiety Medications  Special Instructions: If you have smoked or chewed Tobacco  in the last 2 yrs please stop smoking, stop any regular Alcohol  and or any Recreational drug use.  Wear Seat belts while driving.   Please note  You were cared for by a hospitalist during your hospital stay. If you have any questions about your discharge medications or the care you received while you were in the hospital after you are discharged, you can call the unit and asked to speak with the hospitalist on call if the hospitalist that took care of you is not available. Once you are discharged, your primary care physician will handle any further medical issues. Please note that NO REFILLS for any discharge medications will be authorized once you are discharged, as it is imperative that you return to your primary care physician (or establish a relationship with a primary care physician if you do not have one) for your aftercare needs so that they can reassess your need for medications and monitor your lab values.

## 2015-08-25 NOTE — Evaluation (Signed)
Physical Therapy Evaluation Patient Details Name: Cassandra Glenn MRN: [id number] DOB: [birth year] Today's Date: 08/25/2015   History of Present Illness  82 y.o. female with PMHx of macular degeneration, bilateral hearing loss, osteoporosis, chronic hyponatremia reason unclear in the chart, breast cancer, right bundle branch block, urinary retention requiring twice a day self-catheterization, chronic colonization of urine with bacteria takes nitrofurantoin on a chronic basis, osteoporosis, spinal stenosis with neurogenic claudication who lives in independent living facility and admitted with vertigo  Clinical Impression  Patient evaluated by Physical Therapy with no further acute PT needs identified. All education has been completed and the patient has no further questions.  Performed vestibular evaluation (as below) however did not perform positional testing such as Marye Round as pt's symptoms did not fit diagnosis criteria.  Pt reported symptoms with some oculomotor testing (no nystagmus observed) so provided 3 exercises in handout for habituation and reviewed with pt and daughter.  Pt plans to receive HHPT for f/u so discussed obtaining PT familiar with vertigo diagnoses (if possible) as well as provided outpatient referral for vestibular f/u if needed.  PT is signing off. Thank you for this referral.    08/25/15 0001  Vestibular Assessment  General Observation Pt reports "wavey" sensation started Sunday morning upon waking.  She denies any recent falls or hitting her head.  Symptom Behavior  Type of Dizziness "Funny feeling in head"  Frequency of Dizziness with head movement/eye movement  Duration of Dizziness lasted from waking up until arrival at ED  Aggravating Factors Moving eyes;Spontaneous onset;Turning head quickly  Relieving Factors Head stationary  Occulomotor Exam  Occulomotor Alignment Normal  Spontaneous Absent  Gaze-induced Absent  Head shaking Horizontal Absent  Head  Shaking Vertical Absent  Smooth Pursuits Intact  Saccades Intact  Comment hx of macular degeneration however no significant discrepancies observed, pt does reports her "wavey" sensation with saccades, head shaking  Vestibulo-Occular Reflex  VOR Cancellation Normal  Comment pt able to perform VOR cancellation however reports "wavey sensation" with this        Follow Up Recommendations Home health PT    Equipment Recommendations  None recommended by PT    Recommendations for Other Services       Precautions / Restrictions Precautions Precautions: Fall      Mobility  Bed Mobility Overal bed mobility: Modified Independent                Transfers Overall transfer level: Needs assistance Equipment used: Rolling walker (2 wheeled) Transfers: Sit to/from Stand Sit to Stand: Min guard;Supervision            Ambulation/Gait Ambulation/Gait assistance: Min guard;Supervision Ambulation Distance (Feet): 400 Feet Assistive device: Rolling walker (2 wheeled) Gait Pattern/deviations: Step-through pattern;Decreased stride length     General Gait Details: slow but steady pace, no dizziness during gait  Stairs            Wheelchair Mobility    Modified Rankin (Stroke Patients Only)       Balance                                             Pertinent Vitals/Pain Pain Assessment: No/denies pain    Home Living       Type of Home: Independent living facility       Home Layout: One level Home Equipment: Walker - 2 wheels  Prior Function Level of Independence: Independent with assistive device(s)               Hand Dominance        Extremity/Trunk Assessment   Upper Extremity Assessment: Overall WFL for tasks assessed           Lower Extremity Assessment: Overall WFL for tasks assessed         Communication   Communication: HOH  Cognition Arousal/Alertness: Awake/alert Behavior During Therapy: WFL for  tasks assessed/performed Overall Cognitive Status: Within Functional Limits for tasks assessed                      General Comments      Exercises        Assessment/Plan    PT Assessment All further PT needs can be met in the next venue of care  PT Diagnosis  (vertigo)   PT Problem List Other (comment);Decreased mobility (vertigo)  PT Treatment Interventions     PT Goals (Current goals can be found in the Care Plan section) Acute Rehab PT Goals PT Goal Formulation: All assessment and education complete, DC therapy    Frequency     Barriers to discharge        Co-evaluation               End of Session Equipment Utilized During Treatment: Gait belt Activity Tolerance: Patient tolerated treatment well Patient left: in chair;with call bell/phone within reach;with family/visitor present Nurse Communication: Mobility status    Functional Assessment Tool Used: clinical judgement Functional Limitation: Mobility: Walking and moving around Mobility: Walking and Moving Around Current Status (H0388): At least 1 percent but less than 20 percent impaired, limited or restricted Mobility: Walking and Moving Around Goal Status [phone]): At least 1 percent but less than 20 percent impaired, limited or restricted Mobility: Walking and Moving Around Discharge Status [phone]): At least 1 percent but less than 20 percent impaired, limited or restricted    Time: 0951-1012 PT Time Calculation (min) (ACUTE ONLY): 21 min   Charges:   PT Evaluation $PT Eval Moderate Complexity: 1 Procedure     PT G Codes:   PT G-Codes **NOT FOR INPATIENT CLASS** Functional Assessment Tool Used: clinical judgement Functional Limitation: Mobility: Walking and moving around Mobility: Walking and Moving Around Current Status (H1505): At least 1 percent but less than 20 percent impaired, limited or restricted Mobility: Walking and Moving Around Goal Status [phone]): At least 1 percent but less  than 20 percent impaired, limited or restricted Mobility: Walking and Moving Around Discharge Status [phone]): At least 1 percent but less than 20 percent impaired, limited or restricted    Vardaan Depascale,KATHrine E 08/25/2015, 1:14 PM Carmelia Bake, PT, DPT 08/25/2015 Pager: [phone]

## 2015-08-25 NOTE — Care Management Obs Status (Signed)
[facility] NOTIFICATION   Patient Details  Name: Cassandra Glenn MRN: [id number] Date of Birth: [birth year]   Medicare Observation Status Notification Given:  Yes    MahabirJuliann Pulse, RN 08/25/2015, 1:34 PM

## 2015-08-25 NOTE — Discharge Summary (Signed)
Cassandra Glenn [id number] DOB: 1920-04-19 DOA: 08/24/2015  PCP: Jeanmarie Hubert, MD  Admit date: 08/24/2015  Discharge date: 08/25/2015  Admitted From: ALF   Disposition:  ALF   Recommendations for Outpatient Follow-up:   Follow up with PCP in 1-2 weeks  PCP Please obtain BMP/CBC, 2 view CXR in 1week,  (see Discharge instructions)   PCP Please follow up on the following pending results: None   Home Health: PT-OT   Equipment/Devices: has a walker  Consultations: None Discharge Condition: Stable   CODE STATUS: Full   Diet Recommendation: Heart Healthy   Chief Complaint  Patient presents with  . Dizziness     Brief history of present illness from the day of admission and additional interim summary     Cassandra Glenn  is a 82 y.o. female,  History of macular degeneration, bilateral hearing loss, osteoporosis, chronic hyponatremia reason unclear in the chart, breast cancer, right bundle branch block, urinary retention requiring twice a day self-catheterization, chronic colonization of urine with bacteria takes nitrofurantoin on a chronic basis who lives in independent living facility comes in after she woke up this morning and felt a sensation of spinning while she was in the bed.  She came to the ER where MRI brain was done along with routine EKG and blood work were unremarkable except mild hyponatremia which was lower than her last baseline, UA suggestive of colonization, I was called to admit the patient. Currently completely negative review of systems.  Hospital issues addressed     1. Vertigo. Negative MRI brain, this was mild BPV, this morning she gives history of mild vertigo when she turns head and gets mildly nauseated, not orthostatic, will be seen by PT-OT, symptoms are minimal, she already has a  walker and advised to use at all times, use Antivert as needed. Symptoms minimal will be discharged to ALF today.  2. Hyponatremia persistent on intermittent basis. This was clearly due to dehydration, urine osmolality was much less than serum, urine sodium was 33, has resolved after gentle hydration, patient instructed to keep herself hydrated.  3. History of urinary retention requiring twice a day self-catheterization and history of chronic urine colonization. I do not think she has UTI, she has no dysuria, no fever or leukocytosis, continue nitrofurantoin, expect cultures to suggest colonization. Follow with PCP.  4. History of macular degeneration and bilateral hearing loss. Supportive care, continue wearing hearing aids as needed.  5. Osteoporosis. Continue home supplements.   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 macular degeneration), wet (HCC)   BPV (benign positional vertigo)    Discharge instructions    Discharge Instructions    Diet - low sodium heart healthy    Complete by:  As directed   Discharge instructions    Complete by:  As directed   Follow with  Primary MD Jeanmarie Hubert, MD in 3-4 days   Get CBC, CMP, 2 view Chest X ray checked  by Primary MD or SNF MD in 5-7 days ( we routinely change or add medications that can affect your baseline labs and fluid status, therefore we recommend that you get the mentioned basic workup next visit with your PCP, your PCP may decide not to get them or add new tests based on their clinical decision)   Activity: As tolerated with Full fall precautions use walker/cane & assistance as needed   Disposition Home    Diet:   Heart Healthy .  For Heart failure patients - Check your Weight same time everyday, if you gain over 2 pounds, or you develop in leg  swelling, experience more shortness of breath or chest pain, call your Primary MD immediately. Follow Cardiac Low Salt Diet and 1.5 lit/day fluid restriction.   On your next visit with your primary care physician please Get Medicines reviewed and adjusted.   Please request your Prim.MD to go over all Hospital Tests and Procedure/Radiological results at the follow up, please get all Hospital records sent to your Prim MD by signing hospital release before you go home.   If you experience worsening of your admission symptoms, develop shortness of breath, life threatening emergency, suicidal or homicidal thoughts you must seek medical attention immediately by calling 911 or calling your MD immediately  if symptoms less severe.  You Must read complete instructions/literature along with all the possible adverse reactions/side effects for all the Medicines you take and that have been prescribed to you. Take any new Medicines after you have completely understood and accpet all the possible adverse reactions/side effects.   Do not drive, operate heavy machinery, perform activities at heights, swimming or participation in water activities or provide baby sitting services if your were admitted for syncope or siezures until you have seen by Primary MD or a Neurologist and advised to do so again.  Do not drive when taking Pain medications.    Do not take more than prescribed Pain, Sleep and Anxiety Medications  Special Instructions: If you have smoked or chewed Tobacco  in the last 2 yrs please stop smoking, stop any regular Alcohol  and or any Recreational drug use.  Wear Seat belts while driving.   Please note  You were cared for by a hospitalist during your hospital stay. If you have any questions about your discharge medications or the care you received while you were in the hospital after you are discharged, you can call the unit and asked to speak with the hospitalist on call if the hospitalist  that took care of you is not available. Once you are discharged, your primary care physician will handle any further medical issues. Please note that NO REFILLS for any discharge medications will be authorized once you are discharged, as it is imperative that you return to your primary care physician (or establish a relationship with a primary care physician if you do not have one) for your aftercare needs so that they can reassess your need for medications and monitor your lab values.      Discharge Medications     Medication List    TAKE these medications   acetaminophen 500 MG tablet Commonly known as:  TYLENOL Take 1,000 mg by mouth every 6 (six) hours as needed for moderate pain.   aspirin 81 MG chewable tablet Chew 81 mg by mouth daily.   Bilberry 1000 MG Caps  Take 1,000 mg by mouth daily.   CALCIUM 600/VITAMIN D PO Take 600 mg by mouth 2 (two) times daily.   cholecalciferol 400 units Tabs tablet Commonly known as:  VITAMIN D Take 400 Units by mouth daily.   CRANBERRY EXTRACT PO Take 1 tablet by mouth daily.   FIBERCON PO Take 1 tablet by mouth 2 (two) times daily.   fish oil-omega-3 fatty acids 1000 MG capsule Take 1 g by mouth daily.   GLUCOSAMINE CHONDR COMPLEX PO Take 1 tablet by mouth 2 (two) times daily. for joints   meclizine 25 MG tablet Commonly known as:  ANTIVERT Take 1 tablet (25 mg total) by mouth 3 (three) times daily as needed for dizziness.   nitrofurantoin 50 MG capsule Commonly known as:  MACRODANTIN Take 50 mg by mouth at bedtime.   PRESERVISION/LUTEIN Caps Take 1 capsule by mouth 2 (two) times daily.   RESTORA Caps Take 1 capsule by mouth daily.   zolpidem 5 MG tablet Commonly known as:  AMBIEN Take one half tablet at bedtime if needed for rest What changed:  how much to take  how to take this  when to take this  reasons to take this  additional instructions       Follow-up Information    Jeanmarie Hubert, MD. Schedule an  appointment as soon as possible for a visit in 3 day(s).   Specialty:  Internal Medicine Contact information: [place] [address] [phone]           Major procedures and Radiology Reports - PLEASE review detailed and final reports thoroughly  -        Mr Brain Wo Contrast  Result Date: 08/24/2015 CLINICAL DATA:  Vertigo EXAM: MRI HEAD WITHOUT CONTRAST TECHNIQUE: Multiplanar, multiecho pulse sequences of the brain and surrounding structures were obtained without intravenous contrast. COMPARISON:  CT head 05/22/2014 FINDINGS: Mild cerebral atrophy.  Negative for hydrocephalus. Negative for acute infarct. Moderate chronic microvascular ischemic change in the white matter, thalamus, and pons bilaterally. Negative for hemorrhage or fluid collection. Negative for mass or edema.  No shift of the midline structures Mild mucosal edema paranasal sinuses. No orbital mass. Bilateral lens replacement. Pituitary normal in size IMPRESSION: Atrophy and chronic ischemic change.  No acute abnormality. Electronically Signed   By: Franchot Gallo M.D.   On: 08/24/2015 14:52   Dg Chest Port 1 View  Result Date: 08/24/2015 CLINICAL DATA:  Short of breath EXAM: PORTABLE CHEST 1 VIEW COMPARISON:  05/22/2014 FINDINGS: COPD with hyperinflation. Lungs are clear without an from without infiltrate or effusion. Heart size normal. Negative for heart failure. Apical scarring bilaterally. IMPRESSION: Chronic lung disease with apical scarring. No acute cardiopulmonary abnormality. Electronically Signed   By: Franchot Gallo M.D.   On: 08/24/2015 16:17    Micro Results    No results found for this or any previous visit (from the past 240 hour(s)).  Today   Subjective    Cassandra Glenn today has no headache,no chest abdominal pain,no new weakness tingling or numbness, feels much better wants to go home today.     Objective   Blood pressure (!) 139/49, pulse (!) 59, temperature 97.7 F (36.5  C), temperature source Oral, resp. rate 14, height 5\' 1"  (1.549 m), weight 62.6 kg (138 lb 0.1 oz), SpO2 98 %.   Intake/Output Summary (Last 24 hours) at 08/25/15 0830 Last data filed at 08/25/15 0634  Gross per 24 hour  Intake  638.33 ml  Output             1900 ml  Net         -1261.67 ml    Exam Awake Alert, Oriented x 3, No new F.N deficits, Normal affect [place].AT,PERRAL Supple Neck,No JVD, No cervical lymphadenopathy appriciated.  Symmetrical Chest wall movement, Good air movement bilaterally, CTAB RRR,No Gallops,Rubs or new Murmurs, No Parasternal Heave +ve B.Sounds, Abd Soft, Non tender, No organomegaly appriciated, No rebound -guarding or rigidity. No Cyanosis, Clubbing or edema, No new Rash or bruise   Data Review   CBC w Diff: Lab Results  Component Value Date   WBC 7.8 08/24/2015   HGB 12.4 08/24/2015   HGB 12.6 01/13/2010   HCT 36.9 08/24/2015   HCT 36.5 01/13/2010   PLT 235 08/24/2015   PLT 181 01/13/2010   LYMPHOPCT 25 08/24/2015   LYMPHOPCT 33.4 01/13/2010   MONOPCT 7 08/24/2015   MONOPCT 8.0 01/13/2010   EOSPCT 1 08/24/2015   EOSPCT 3.4 01/13/2010   BASOPCT 0 08/24/2015   BASOPCT 0.5 01/13/2010    CMP: Lab Results  Component Value Date   NA 135 08/25/2015   NA 138 07/24/2015   K 3.9 08/25/2015   CL 103 08/25/2015   CO2 22 08/25/2015   BUN 10 08/25/2015   BUN 15 07/24/2015   CREATININE 0.57 08/25/2015   CREATININE 0.86 07/03/2015   GLU 90 07/24/2015   PROT 6.9 08/24/2015   ALBUMIN 4.1 08/24/2015   BILITOT 0.5 08/24/2015   ALKPHOS 119 08/24/2015   AST 24 08/24/2015   ALT 13 (L) 08/24/2015  .   Total Time in preparing paper work, data evaluation and todays exam - 35 minutes  Thurnell Lose M.D on 08/25/2015 at 8:30 AM  [facility]   Office  [phone]

## 2015-08-25 NOTE — Progress Notes (Signed)
Discharge instructions reviewed with patient and daughter, questions answered, verbalized instructions back to RN.  Prescription for Antivert given to daughter.  Dtr questioned if patient needed any antibiotics for UTI, verified with Dr. Candiss Norse that there was no UTI and no further antibiotics necessary.  Dtr plans to transport patient back to [facility].

## 2015-08-25 NOTE — Care Management Note (Signed)
Case Management Note  Patient Details  Name: Cassandra Glenn MRN: [id number] Date of Birth: [birth year]  Subjective/Objective:MD ordered HHPT/OT.[facility] chose[facility]-faxed w/confirmation to [facility],f41f orders,h&p,d/c summary-they contract w/[facility] . Dtr will transport patient back to [facility] on own. No further CM needs or orders.                 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

## 2015-08-26 ENCOUNTER — Encounter: Payer: Self-pay | Admitting: Internal Medicine

## 2015-08-26 ENCOUNTER — Non-Acute Institutional Stay: Payer: Medicare Other | Admitting: Internal Medicine

## 2015-08-26 VITALS — BP 130/62 | HR 70 | Temp 97.6°F | Ht 61.0 in | Wt 140.0 lb

## 2015-08-26 DIAGNOSIS — E871 Hypo-osmolality and hyponatremia: Secondary | ICD-10-CM | POA: Diagnosis not present

## 2015-08-26 DIAGNOSIS — R42 Dizziness and giddiness: Secondary | ICD-10-CM

## 2015-08-26 HISTORY — DX: Dizziness and giddiness: R42

## 2015-08-26 NOTE — Progress Notes (Signed)
Facility      Place of Service: Clinic (12)     No Known Allergies  Chief Complaint  Patient presents with  . Medical Management of Chronic Issues    dizziness. Went to ER 08/24/15 for dizziness. Urine culture E-Coli 100,000. Here with helper Diane    HPI:  Hospitalized 08/24/2015 through 08/25/2015. Patient was hospitalized for a sensation of spinning. She was diagnosed with vertigo. Other findings included hyponatremia, which has returned to normal.  Discharge summary from the hospital suggested that she should have a follow-up chest x-ray will following discharge. A chest x-ray was done on 08/24/2015. Final impression was chronic lung disease with apical scarring but no acute cardiopulmonary abnormality. I see nothing on this chest x-ray that needs further follow-up.  She is being followed by PT for training in exercises to improve the vertigo.  Daughter is concered that there may be wax in the ears that is causing the vertigo.   Medications: Patient's Medications  New Prescriptions   No medications on file  Previous Medications   ACETAMINOPHEN (TYLENOL) 500 MG TABLET    Take 1,000 mg by mouth every 6 (six) hours as needed for moderate pain.    ASPIRIN 81 MG CHEWABLE TABLET    Chew 81 mg by mouth daily.    BILBERRY 1000 MG CAPS    Take 1,000 mg by mouth daily.    CALCIUM CARBONATE-VITAMIN D (CALCIUM 600/VITAMIN D PO)    Take 600 mg by mouth 2 (two) times daily.    CALCIUM POLYCARBOPHIL (FIBERCON PO)    Take 1 tablet by mouth 2 (two) times daily.    CHOLECALCIFEROL (VITAMIN D) 400 UNITS TABS    Take 400 Units by mouth daily.    CRANBERRY EXTRACT PO    Take 1 tablet by mouth daily.   FISH OIL-OMEGA-3 FATTY ACIDS 1000 MG CAPSULE    Take 1 g by mouth daily.    GLUCOSAMINE-CHONDROITIN (GLUCOSAMINE CHONDR COMPLEX PO)    Take 1 tablet by mouth 2 (two) times daily. for joints   MECLIZINE (ANTIVERT) 25 MG TABLET    Take 1 tablet (25 mg total) by mouth 3 (three) times daily as  needed for dizziness.   MULTIPLE VITAMINS-MINERALS (PRESERVISION/LUTEIN) CAPS    Take 1 capsule by mouth 2 (two) times daily.   NITROFURANTOIN (MACRODANTIN) 50 MG CAPSULE    Take 50 mg by mouth at bedtime.   PROBIOTIC PRODUCT (RESTORA) CAPS    Take 1 capsule by mouth daily.   ZOLPIDEM (AMBIEN) 5 MG TABLET    Take one half tablet at bedtime if needed for rest  Modified Medications   No medications on file  Discontinued Medications   No medications on file     Review of Systems  Constitutional: Positive for activity change and appetite change. Negative for fatigue, fever and unexpected weight change.  HENT: Positive for hearing loss (bilateral earing aides). Negative for ear pain.   Eyes:       History of loss of visual acuity and macular degeneration.  Respiratory:       History of breast discomfort. History of removal of a lump of cancer from the right breast. Dyspnea on exertion.  Cardiovascular: Positive for leg swelling. Negative for chest pain and palpitations.  Gastrointestinal:       She frequently complains that her intestines are not working right. There is a previous history of diverticulosis. Her last colonoscopy was in 2008. She has been seen by Dr. Paulita Fujita  in the past. Constipation - she associates with Tramadol  Genitourinary: Positive for decreased urine volume, difficulty urinating and frequency. Negative for dysuria, vaginal bleeding and vaginal discharge.       Complains of a slow, weak urinary stream. Denies dysuria. Has increased frequency and nocturia. Saw Dr. Louis Meckel, urologist. She was taught self-catheterization for a bladder that does not empty well. Vaginal discomfort. Managed by Dr. Claudean Kinds. History of elevated CEA 125. Normal ultrasound of the abdomen and genitourinary tract.  Musculoskeletal:       Chronic back pain.  Patient has a shorter right leg and has a lift in the right shoe. Now seeing Dr. Rhona Raider.  Right hip arthroplasty 5/16 There is generalized  stiffness in the joints. There is a history of spinal stenosis. Left third finger catches on extension sometimes. There is a trigger finger. Pain at the left iliac crest and left anterior ribs near the sternum.  Skin:       History of skin discoloration and changes of nails.  Rash on the left scapula, right forearm, and left forearm.  Allergic/Immunologic: Negative.   Neurological: Positive for dizziness. Negative for tremors, seizures and numbness.       History of spinal stenosis with neurogenic claudication affecting the right leg. Complains of pain in the buttocks that is suggestive of neuralgia from her spinal stenosis. Hospitalized for 24 hours 08/24/2015 for vertigo.  Hematological: Bruises/bleeds easily.  Psychiatric/Behavioral: Negative.     Vitals:   08/26/15 1115  BP: 130/62  Pulse: 70  Temp: 97.6 F (36.4 C)  TempSrc: Oral  SpO2: 96%  Weight: 140 lb (63.5 kg)  Height: 5' 1"  (1.549 m)   Wt Readings from Last 3 Encounters:  08/26/15 140 lb (63.5 kg)  08/25/15 138 lb 0.1 oz (62.6 kg)  07/29/15 139 lb (63 kg)    Body mass index is 26.45 kg/m.  Physical Exam  Constitutional: She is oriented to person, place, and time. She appears well-developed and well-nourished. No distress.  HENT:  HOH Nonocclusive wax in both EAC.  Eyes:  Corrective lenses.  Neck: No JVD present. No tracheal deviation present. No thyromegaly present.  Cardiovascular: Normal rate, regular rhythm and normal heart sounds.  Exam reveals no gallop and no friction rub.   No murmur heard. Pulses in RLE not palpable, has 2+ edema which could be obscuring pulses  Pulmonary/Chest: Effort normal and breath sounds normal. She has no rales.  Abdominal: Soft. Bowel sounds are normal. She exhibits no distension and no mass. There is no tenderness.  Musculoskeletal: Normal range of motion. She exhibits edema (RLE). She exhibits no tenderness.  Poor balance; using walker with PT following right hip  replacement  Lymphadenopathy:    She has no cervical adenopathy.  Neurological: She is alert and oriented to person, place, and time. No cranial nerve deficit.  Skin: Skin is warm and dry. There is pallor.  Ovoid reddish patch at the left scapula. I am unable to find any blisters on this patch. Right forearm has a larger firm lump which has a reddish covering. Nontender. Left forearm has a reddish patch, but no lump of tissue under the patch.  Psychiatric: She has a normal mood and affect. Her behavior is normal. Thought content normal.     Labs reviewed: Lab Summary Latest Ref Rng & Units 08/25/2015 08/24/2015 07/24/2015 07/03/2015  Hemoglobin 12.0 - 15.0 g/dL (None) 12.4 (None) (None)  Hematocrit 36.0 - 46.0 % (None) 36.9 (None) (None)  White count 4.0 -  10.5 K/uL (None) 7.8 (None) (None)  Platelet count 150 - 400 K/uL (None) 235 (None) (None)  Sodium 135 - 145 mmol/L 135 129(L) 138 131(L)  Potassium 3.5 - 5.1 mmol/L 3.9 4.0 5.1 4.8  Calcium 8.9 - 10.3 mg/dL 8.9 9.3 (None) 8.0(L)  Phosphorus - (None) (None) (None) (None)  Creatinine 0.44 - 1.00 mg/dL 0.57 0.71 0.9 0.86  AST 15 - 41 U/L (None) 24 17 19   Alk Phos 38 - 126 U/L (None) 119 120 119  Bilirubin 0.3 - 1.2 mg/dL (None) 0.5 (None) 0.5  Glucose 65 - 99 mg/dL 109(H) 132(H) 90 98  Cholesterol - (None) (None) (None) (None)  HDL cholesterol - (None) (None) (None) (None)  Triglycerides - (None) (None) (None) (None)  LDL Direct - (None) (None) (None) (None)  LDL Calc - (None) (None) (None) (None)  Total protein 6.5 - 8.1 g/dL (None) 6.9 (None) 6.8  Albumin 3.5 - 5.0 g/dL (None) 4.1 (None) 4.3  Some recent data might be hidden   Lab Results  Component Value Date   TSH 3.215 08/25/2015   Lab Results  Component Value Date   BUN 10 08/25/2015   BUN 11 08/24/2015   BUN 15 07/24/2015   Lab Results  Component Value Date   CREATININE 0.57 08/25/2015   CREATININE 0.71 08/24/2015   CREATININE 0.9 07/24/2015   Lab Results    Component Value Date   HGBA1C 5.9 01/23/2015    Mr Brain Wo Contrast  Result Date: 08/24/2015 CLINICAL DATA:  Vertigo EXAM: MRI HEAD WITHOUT CONTRAST TECHNIQUE: Multiplanar, multiecho pulse sequences of the brain and surrounding structures were obtained without intravenous contrast. COMPARISON:  CT head 05/22/2014 FINDINGS: Mild cerebral atrophy.  Negative for hydrocephalus. Negative for acute infarct. Moderate chronic microvascular ischemic change in the white matter, thalamus, and pons bilaterally. Negative for hemorrhage or fluid collection. Negative for mass or edema.  No shift of the midline structures Mild mucosal edema paranasal sinuses. No orbital mass. Bilateral lens replacement. Pituitary normal in size IMPRESSION: Atrophy and chronic ischemic change.  No acute abnormality. Electronically Signed   By: Franchot Gallo M.D.   On: 08/24/2015 14:52   Dg Chest Port 1 View  Result Date: 08/24/2015 CLINICAL DATA:  Short of breath EXAM: PORTABLE CHEST 1 VIEW COMPARISON:  05/22/2014 FINDINGS: COPD with hyperinflation. Lungs are clear without an from without infiltrate or effusion. Heart size normal. Negative for heart failure. Apical scarring bilaterally. IMPRESSION: Chronic lung disease with apical scarring. No acute cardiopulmonary abnormality. Electronically Signed   By: Franchot Gallo M.D.   On: 08/24/2015 16:17     Assessment/Plan  1. Vertigo Improved Continue exercises as taught by PT  2. Hyponatremia Resolved.  -CMP in mid-Sept 2017.

## 2015-08-28 DIAGNOSIS — M6281 Muscle weakness (generalized): Secondary | ICD-10-CM | POA: Diagnosis not present

## 2015-08-28 DIAGNOSIS — M545 Low back pain: Secondary | ICD-10-CM | POA: Diagnosis not present

## 2015-09-02 DIAGNOSIS — M6281 Muscle weakness (generalized): Secondary | ICD-10-CM | POA: Diagnosis not present

## 2015-09-02 DIAGNOSIS — M545 Low back pain: Secondary | ICD-10-CM | POA: Diagnosis not present

## 2015-09-04 DIAGNOSIS — H43813 Vitreous degeneration, bilateral: Secondary | ICD-10-CM | POA: Diagnosis not present

## 2015-09-04 DIAGNOSIS — H353112 Nonexudative age-related macular degeneration, right eye, intermediate dry stage: Secondary | ICD-10-CM | POA: Diagnosis not present

## 2015-09-04 DIAGNOSIS — H353292 Exudative age-related macular degeneration, unspecified eye, with inactive choroidal neovascularization: Secondary | ICD-10-CM | POA: Diagnosis not present

## 2015-09-04 DIAGNOSIS — Z961 Presence of intraocular lens: Secondary | ICD-10-CM | POA: Diagnosis not present

## 2015-09-05 DIAGNOSIS — M6281 Muscle weakness (generalized): Secondary | ICD-10-CM | POA: Diagnosis not present

## 2015-09-05 DIAGNOSIS — M545 Low back pain: Secondary | ICD-10-CM | POA: Diagnosis not present

## 2015-09-09 DIAGNOSIS — M545 Low back pain: Secondary | ICD-10-CM | POA: Diagnosis not present

## 2015-09-09 DIAGNOSIS — M6281 Muscle weakness (generalized): Secondary | ICD-10-CM | POA: Diagnosis not present

## 2015-09-12 DIAGNOSIS — M545 Low back pain: Secondary | ICD-10-CM | POA: Diagnosis not present

## 2015-09-12 DIAGNOSIS — M6281 Muscle weakness (generalized): Secondary | ICD-10-CM | POA: Diagnosis not present

## 2015-09-16 DIAGNOSIS — M6281 Muscle weakness (generalized): Secondary | ICD-10-CM | POA: Diagnosis not present

## 2015-09-16 DIAGNOSIS — M545 Low back pain: Secondary | ICD-10-CM | POA: Diagnosis not present

## 2015-09-22 ENCOUNTER — Encounter: Payer: Self-pay | Admitting: Internal Medicine

## 2015-09-22 DIAGNOSIS — R55 Syncope and collapse: Secondary | ICD-10-CM | POA: Diagnosis not present

## 2015-09-22 DIAGNOSIS — E871 Hypo-osmolality and hyponatremia: Secondary | ICD-10-CM | POA: Diagnosis not present

## 2015-09-22 DIAGNOSIS — R739 Hyperglycemia, unspecified: Secondary | ICD-10-CM | POA: Diagnosis not present

## 2015-09-22 LAB — BASIC METABOLIC PANEL
BUN: 15 mg/dL (ref 4–21)
Creatinine: 1 mg/dL (ref 0.5–1.1)
Potassium: 4.7 mmol/L (ref 3.4–5.3)
SODIUM: 136 mmol/L — AB (ref 137–147)

## 2015-09-22 LAB — CBC AND DIFFERENTIAL
HCT: 41 % (ref 36–46)
HEMOGLOBIN: 13.7 g/dL (ref 12.0–16.0)
Platelets: 277 10*3/uL (ref 150–399)
WBC: 6.4 10*3/mL

## 2015-09-22 LAB — HEPATIC FUNCTION PANEL
ALK PHOS: 165 U/L — AB (ref 25–125)
ALT: 11 U/L (ref 7–35)
AST: 19 U/L (ref 13–35)
BILIRUBIN, TOTAL: 0.5 mg/dL

## 2015-09-23 DIAGNOSIS — H353112 Nonexudative age-related macular degeneration, right eye, intermediate dry stage: Secondary | ICD-10-CM | POA: Diagnosis not present

## 2015-09-23 DIAGNOSIS — H35371 Puckering of macula, right eye: Secondary | ICD-10-CM | POA: Diagnosis not present

## 2015-09-24 ENCOUNTER — Telehealth: Payer: Self-pay

## 2015-09-24 NOTE — Telephone Encounter (Signed)
Called patient about her 09/22/15 labs. Per Dr. Nyoka Cowden results are fine. Patient appreciated the call, will see her 01/27/16. Abstracted labs

## 2015-09-25 DIAGNOSIS — M6281 Muscle weakness (generalized): Secondary | ICD-10-CM | POA: Diagnosis not present

## 2015-09-25 DIAGNOSIS — M545 Low back pain: Secondary | ICD-10-CM | POA: Diagnosis not present

## 2015-10-16 ENCOUNTER — Encounter: Payer: Self-pay | Admitting: Family Medicine

## 2015-10-16 ENCOUNTER — Ambulatory Visit (INDEPENDENT_AMBULATORY_CARE_PROVIDER_SITE_OTHER): Payer: Medicare Other

## 2015-10-16 ENCOUNTER — Ambulatory Visit (INDEPENDENT_AMBULATORY_CARE_PROVIDER_SITE_OTHER): Payer: Medicare Other | Admitting: Family Medicine

## 2015-10-16 ENCOUNTER — Telehealth: Payer: Self-pay

## 2015-10-16 VITALS — BP 122/76 | HR 91 | Temp 98.1°F | Resp 18 | Ht 61.0 in | Wt 141.0 lb

## 2015-10-16 DIAGNOSIS — M546 Pain in thoracic spine: Secondary | ICD-10-CM

## 2015-10-16 DIAGNOSIS — S2242XA Multiple fractures of ribs, left side, initial encounter for closed fracture: Secondary | ICD-10-CM

## 2015-10-16 DIAGNOSIS — R918 Other nonspecific abnormal finding of lung field: Secondary | ICD-10-CM | POA: Diagnosis not present

## 2015-10-16 DIAGNOSIS — M8000XA Age-related osteoporosis with current pathological fracture, unspecified site, initial encounter for fracture: Secondary | ICD-10-CM

## 2015-10-16 MED ORDER — AMOXICILLIN 875 MG PO TABS: 875.0000 mg | ORAL_TABLET | Freq: Two times a day (BID) | ORAL | 0 refills | Status: DC

## 2015-10-16 NOTE — Patient Instructions (Addendum)
1. You have two rib fractures: fifth and sixth ribs. 2.  You need a repeat chest xray in three months to confirm that RIGHT lung opacity has resolved. 3.  Please call in two weeks if the cough has not improved.   Rib Fracture A rib fracture is a break or crack in one of the bones of the ribs. The ribs are a group of long, curved bones that wrap around your chest and attach to your spine. They protect your lungs and other organs in the chest cavity. A broken or cracked rib is often painful, but most do not cause other problems. Most rib fractures heal on their own over time. However, rib fractures can be more serious if multiple ribs are broken or if broken ribs move out of place and push against other structures. CAUSES   A direct blow to the chest. For example, this could happen during contact sports, a car accident, or a fall against a hard object.  Repetitive movements with high force, such as pitching a baseball or having severe coughing spells. SYMPTOMS   Pain when you breathe in or cough.  Pain when someone presses on the injured area. DIAGNOSIS  Your caregiver will perform a physical exam. Various imaging tests may be ordered to confirm the diagnosis and to look for related injuries. These tests may include a chest X-ray, computed tomography (CT), magnetic resonance imaging (MRI), or a bone scan. TREATMENT  Rib fractures usually heal on their own in 1-3 months. The longer healing period is often associated with a continued cough or other aggravating activities. During the healing period, pain control is very important. Medication is usually given to control pain. Hospitalization or surgery may be needed for more severe injuries, such as those in which multiple ribs are broken or the ribs have moved out of place.  HOME CARE INSTRUCTIONS   Avoid strenuous activity and any activities or movements that cause pain. Be careful during activities and avoid bumping the injured rib.  Gradually  increase activity as directed by your caregiver.  Only take over-the-counter or prescription medications as directed by your caregiver. Do not take other medications without asking your caregiver first.  Apply ice to the injured area for the first 1-2 days after you have been treated or as directed by your caregiver. Applying ice helps to reduce inflammation and pain.  Put ice in a plastic bag.  Place a towel between your skin and the bag.   Leave the ice on for 15-20 minutes at a time, every 2 hours while you are awake.  Perform deep breathing as directed by your caregiver. This will help prevent pneumonia, which is a common complication of a broken rib. Your caregiver may instruct you to:  Take deep breaths several times a day.  Try to cough several times a day, holding a pillow against the injured area.  Use a device called an incentive spirometer to practice deep breathing several times a day.  Drink enough fluids to keep your urine clear or pale yellow. This will help you avoid constipation.   Do not wear a rib belt or binder. These restrict breathing, which can lead to pneumonia.  SEEK IMMEDIATE MEDICAL CARE IF:   You have a fever.   You have difficulty breathing or shortness of breath.   You develop a continual cough, or you cough up thick or bloody sputum.  You feel sick to your stomach (nausea), throw up (vomit), or have abdominal pain.  You have worsening pain not controlled with medications.  MAKE SURE YOU:  Understand these instructions.  Will watch your condition.  Will get help right away if you are not doing well or get worse.   This information is not intended to replace advice given to you by your health care provider. Make sure you discuss any questions you have with your health care provider.   Document Released: 12/28/2004 Document Revised: 08/30/2012 Document Reviewed: 03/01/2012 Elsevier Interactive Patient Education 2016 [employer].     IF you received an x-ray today, you will receive an invoice from [facility]. Please contact [facility] at [phone] with questions or concerns regarding your invoice.   IF you received labwork today, you will receive an invoice from [employer]. Please contact [facility] at [phone] with questions or concerns regarding your invoice.   Our billing staff will not be able to assist you with questions regarding bills from these companies.  You will be contacted with the lab results as soon as they are available. The fastest way to get your results is to activate your My Chart account. Instructions are located on the last page of this paperwork. If you have not heard from [place] regarding the results in 2 weeks, please contact this office.

## 2015-10-16 NOTE — Telephone Encounter (Signed)
Noted  

## 2015-10-16 NOTE — Telephone Encounter (Signed)
Tightness in chest and chest congestion , not sure how long with symptoms. Patient's daughter would like her mother seen today.  No available appointment's. Patient instructed to go to Urgent Care

## 2015-10-16 NOTE — Progress Notes (Signed)
Subjective:    Patient ID: Cassandra Glenn, female    DOB: 1920-09-09, 82 y.o.   MRN: [id number]  10/16/2015  Bronchitis (hurts when she breaths)   HPI This 82 y.o. female presents for evaluation of L thoracic pain and pain with breathing.  Onset yesterday.  Similar symptoms in the past and resolved.  No acute illness.  No fever/chills/sweats.  Energy level is normal.  Tortuous.  Has never been fast.  Appetite is normal.   No unusual activity.  No medication; takes Tylenol every morning.  Sometimes will take Tylenol in the evenings.  Two in the morning and two at night.  Eating has no effect on pain.  Caths twice daily.  No odor to urine.  Urinates normally sometimes.  No constipation; daily b.m.  No diarrhea.  Mild scant cough at nighttime yet it sounds horrible.  Osteoporososis; s/p Fosamax x 5 years.   Review of Systems  Constitutional: Negative for chills, diaphoresis, fatigue and fever.  HENT: Negative for congestion, ear pain, postnasal drip, rhinorrhea and sore throat.   Respiratory: Positive for cough. Negative for shortness of breath and wheezing.   Cardiovascular: Negative for chest pain, palpitations and leg swelling.  Gastrointestinal: Negative for abdominal pain, diarrhea, nausea and vomiting.  Musculoskeletal: Positive for back pain and myalgias. Negative for neck pain and neck stiffness.    Past Medical History:  Diagnosis Date  . Abnormalities of the hair 02/29/2012  . Arthritis   . Cancer ([place])   . Candidiasis of skin and nails 03/19/2011  . Closed fracture of base of neck of femur ([place]) 03/18/2011  . Contact dermatitis and other eczema, due to unspecified cause 05/07/2011  . Depression 07/23/2014  . Diverticulosis of colon (without mention of hemorrhage) 03/19/2011  . Edema 05/07/2011  . HOH (hard of hearing)   . Hypopotassemia 03/26/2011  . Hyposmolality and/or hyponatremia 03/19/2011  . Hypotension, unspecified 03/18/2011  . Impaired fasting glucose  03/19/2011  . Insomnia, unspecified 03/19/2011  . Kidney infection   . Lumbago 03/19/2011  . Macular degeneration   . Malignant neoplasm of breast (female), unspecified site 10/28/2002  . Osteoporosis, unspecified 03/19/2011  . Other malaise and fatigue 03/19/2011  . Pain in joint, pelvic region and thigh 03/19/2011  . Right bundle branch block 08/30/2011  . Shortness of breath   . Spasm of muscle 03/19/2011  . Spinal stenosis, lumbar region, with neurogenic claudication 11/02/2011  . Trigger finger (acquired) 11/02/2011  . Unspecified constipation 03/19/2011  . Vaginitis and vulvovaginitis 02/29/2012  . Vertigo 08/26/2015   Past Surgical History:  Procedure Laterality Date  . BREAST SURGERY Bilateral 2005-10/27/2004   lumpectomy- Streck,MD  . CATARACT EXTRACTION  2010   bialteral  . EYE SURGERY     cataract  . FRACTURE SURGERY Left 09/1980   ankle  . HARDWARE REMOVAL Right 05/28/2014   Procedure: HARDWARE REMOVAL;  Surgeon: Melrose Nakayama, MD;  Location: [place];  Service: Orthopedics;  Laterality: Right;  . HIP PINNING,CANNULATED  03/08/2011   Procedure: CANNULATED HIP PINNING;  Surgeon: Johnn Hai, MD;  Location: [facility];  Service: Orthopedics;  Laterality: Right;  . ORIF HIP FRACTURE Right 03/08/2011   Bean,MD  . SKIN CANCER EXCISION Left 10/12/2012   lower leg Dr. [place]  . SKIN LESION EXCISION Right 02/04/2011   Abdomen lesion spongiotic dermatitis-Taffeen, MD  . SQUAMOUS CELL CARCINOMA EXCISION Right 02/04/2011   forearmSyble Creek, MD  . TONSILLECTOMY    . TOTAL HIP ARTHROPLASTY Right 05/28/2014  Procedure: TOTAL HIP ARTHROPLASTY ANTERIOR APPROACH;  Surgeon: Melrose Nakayama, MD;  Location: [place];  Service: Orthopedics;  Laterality: Right;   No Known Allergies Current Outpatient Prescriptions  Medication Sig Dispense Refill  . acetaminophen (TYLENOL) 500 MG tablet Take 1,000 mg by mouth every 6 (six) hours as needed for moderate pain.     Marland Kitchen aspirin 81 MG chewable  tablet Chew 81 mg by mouth daily.     . Bilberry 1000 MG CAPS Take 1,000 mg by mouth daily.     . Calcium Carbonate-Vitamin D (CALCIUM 600/VITAMIN D PO) Take 600 mg by mouth 2 (two) times daily.     . Calcium Polycarbophil (FIBERCON PO) Take 1 tablet by mouth 2 (two) times daily.     . cholecalciferol (VITAMIN D) 400 UNITS TABS Take 400 Units by mouth daily.     Marland Kitchen CRANBERRY EXTRACT PO Take 1 tablet by mouth daily.    . fish oil-omega-3 fatty acids 1000 MG capsule Take 1 g by mouth daily.     . Glucosamine-Chondroitin (GLUCOSAMINE CHONDR COMPLEX PO) Take 1 tablet by mouth 2 (two) times daily. for joints    . meclizine (ANTIVERT) 25 MG tablet Take 1 tablet (25 mg total) by mouth 3 (three) times daily as needed for dizziness. 30 tablet 0  . Multiple Vitamins-Minerals (PRESERVISION/LUTEIN) CAPS Take 1 capsule by mouth 2 (two) times daily.    . nitrofurantoin (MACRODANTIN) 50 MG capsule Take 50 mg by mouth at bedtime.    . Probiotic Product (RESTORA) CAPS Take 1 capsule by mouth daily.    Marland Kitchen zolpidem (AMBIEN) 5 MG tablet Take one half tablet at bedtime if needed for rest (Patient taking differently: Take 5 mg by mouth at bedtime as needed for sleep. ) 15 tablet 5  . amoxicillin (AMOXIL) 875 MG tablet Take 1 tablet (875 mg total) by mouth 2 (two) times daily. 20 tablet 0   No current facility-administered medications for this visit.    Social History   Social History  . Marital status: Widowed    Spouse name: N/A  . Number of children: N/A  . Years of education: N/A   Occupational History  . homemaker    Social History Main Topics  . Smoking status: Never Smoker  . Smokeless tobacco: Never Used  . Alcohol use 0.0 oz/week     Comment: occasional glass  . Drug use: No  . Sexual activity: No   Other Topics Concern  . Not on file   Social History Narrative   Lives at [facility]   Widowed   Environmental consultant   Exercise none   POA/Living Will       Family History  Problem Relation Age  of Onset  . Heart disease Mother   . Cancer Father   . Emphysema Brother   . Alzheimer's disease Brother        Objective:    BP 122/76 (BP Location: Left Arm, Patient Position: Sitting, Cuff Size: Small)   Pulse 91   Temp 98.1 F (36.7 C) (Oral)   Resp 18   Ht 5\' 1"  (1.549 m)   Wt 141 lb (64 kg)   SpO2 96%   BMI 26.64 kg/m  Physical Exam  Constitutional: She is oriented to person, place, and time. She appears well-developed and well-nourished. No distress.  HENT:  Head: Normocephalic and atraumatic.  Right Ear: External ear normal.  Left Ear: External ear normal.  Nose: Nose normal.  Mouth/Throat: Oropharynx is clear and  moist.  Eyes: Conjunctivae and EOM are normal. Pupils are equal, round, and reactive to light.  Neck: Normal range of motion. Neck supple. Carotid bruit is not present. No thyromegaly present.  Cardiovascular: Normal rate, regular rhythm, normal heart sounds and intact distal pulses.  Exam reveals no gallop and no friction rub.   No murmur heard. Pulmonary/Chest: Effort normal and breath sounds normal. She has no wheezes. She has no rales.  Abdominal: Soft. Bowel sounds are normal. She exhibits no distension and no mass. There is no tenderness. There is no rebound and no guarding.  Musculoskeletal:       Cervical back: Normal.       Thoracic back: She exhibits tenderness, bony tenderness, pain and spasm. She exhibits normal range of motion and normal pulse.  Thoracic spine: no midline tenderness; +TTP posterior thoracic region, lateral ribs, L anterior ribs.    Lymphadenopathy:    She has no cervical adenopathy.  Neurological: She is alert and oriented to person, place, and time. No cranial nerve deficit.  Skin: Skin is warm and dry. No rash noted. She is not diaphoretic. No erythema. No pallor.  Psychiatric: She has a normal mood and affect. Her behavior is normal.   Results for orders placed or performed in visit on 09/24/15  CBC and differential    Result Value Ref Range   Hemoglobin 13.7 12.0 - 16.0 g/dL   HCT 41 36 - 46 %   Platelets 277 150 - 399 K/L   WBC 6.4 123456  Basic metabolic panel  Result Value Ref Range   BUN 15 4 - 21 mg/dL   Creatinine 1.0 0.5 - 1.1 mg/dL   Potassium 4.7 3.4 - 5.3 mmol/L   Sodium 136 (A) 137 - 147 mmol/L  Hepatic function panel  Result Value Ref Range   Alkaline Phosphatase 165 (A) 25 - 125 U/L   ALT 11 7 - 35 U/L   AST 19 13 - 35 U/L   Bilirubin, Total 0.5 mg/dL   Dg Ribs Unilateral W/chest Left  Result Date: 10/16/2015 CLINICAL DATA:  Left anterior rib pain EXAM: LEFT RIBS AND CHEST - 3+ VIEW COMPARISON:  8/131/7 FINDINGS: Normal heart size. Aortic atherosclerosis is noted. No pleural effusion or edema. Nodular opacity in the right upper lobe measures 1.5 cm. This is new since 08/24/2015. There is an acute fracture involving the posterior lateral aspect of the left fifth and sixth ribs no right rib deformities identified. IMPRESSION: 1. Left fifth and sixth rib fractures. 2. Nodular opacity in the right upper lobe is new from 08/24/2015. Recommend followup examination with upright PA and lateral chest radiograph in 3 months confirm resolution. Electronically Signed   By: Kerby Moors M.D.   On: 10/16/2015 16:59   Dg Thoracic Spine 2 View  Result Date: 10/16/2015 CLINICAL DATA:  82 year old female with left-sided thoracic spine pain and pleuritic pain. EXAM: THORACIC SPINE 2 VIEWS COMPARISON:  Prior chest x-ray 08/24/2015 FINDINGS: Dextro convex scoliosis centered at the thoracolumbar junction. No significant interval progression. Stable T12 compression fracture. No significant interval progression of height loss. No evidence of new acute fracture or malalignment. Atherosclerotic calcifications throughout the thoracic aorta. No large pneumothorax or pleural effusion on the limited views of the chest. The lungs remain hyperinflated with central bronchitic changes. IMPRESSION: 1. No acute fracture or  malalignment. 2. Stable chronic T12 compression fracture. 3. Dextro convex and rotary scoliosis centered at the thoracolumbar junction. 4. Multilevel degenerative disc disease. 5.  Aortic Atherosclerosis (  ICD10-170.0) Electronically Signed   By: Jacqulynn Cadet M.D.   On: 10/16/2015 16:59       Assessment & Plan:   1. Acute left-sided thoracic back pain   2. Closed fracture of multiple ribs of left side, initial encounter   3. Opacity of lung on imaging study   4. Age-related osteoporosis with current pathological fracture, initial encounter    -New non-traumatic rib fractures (2) identified; recommend rest, Tylenol.  Recommend performing incentive spirometry throughout the day. -treat lung opacity for pneumonia considering current rib fractures; treat with Amoxicillin; will add Doxy or Azithro if cough progresses or if develops fever.   -repeat CXR in 3 months to confirm resolution of R opacity.   Orders Placed This Encounter  Procedures  . DG Thoracic Spine 2 View    Standing Status:   Future    Number of Occurrences:   1    Standing Expiration Date:   10/15/2016    Order Specific Question:   Reason for Exam (SYMPTOM  OR DIAGNOSIS REQUIRED)    Answer:   L thoracic pain; pain with deep inspiration; L anterior rib pain    Order Specific Question:   Preferred imaging location?    Answer:   External  . DG Ribs Unilateral W/Chest Left    Standing Status:   Future    Number of Occurrences:   1    Standing Expiration Date:   10/15/2016    Order Specific Question:   Reason for Exam (SYMPTOM  OR DIAGNOSIS REQUIRED)    Answer:   L thoracic pain; pain with deep inspiration; L anterior rib pain    Order Specific Question:   Preferred imaging location?    Answer:   External   Meds ordered this encounter  Medications  . amoxicillin (AMOXIL) 875 MG tablet    Sig: Take 1 tablet (875 mg total) by mouth 2 (two) times daily.    Dispense:  20 tablet    Refill:  0    No Follow-up on  file.   Addaline Peplinski Elayne Guerin, M.D. [facility] [address] [place], [place]  [address] [phone] phone [phone] fax

## 2015-10-20 DIAGNOSIS — N3020 Other chronic cystitis without hematuria: Secondary | ICD-10-CM | POA: Diagnosis not present

## 2015-10-20 DIAGNOSIS — N390 Urinary tract infection, site not specified: Secondary | ICD-10-CM | POA: Diagnosis not present

## 2015-10-22 ENCOUNTER — Encounter (HOSPITAL_COMMUNITY): Payer: Self-pay

## 2015-10-22 ENCOUNTER — Emergency Department (HOSPITAL_COMMUNITY): Payer: Medicare Other

## 2015-10-22 ENCOUNTER — Emergency Department (HOSPITAL_COMMUNITY)
Admission: EM | Admit: 2015-10-22 | Discharge: 2015-10-22 | Disposition: A | Discharge status: Home / Self Care | Payer: Medicare Other | Attending: Emergency Medicine | Admitting: Emergency Medicine

## 2015-10-22 DIAGNOSIS — R531 Weakness: Secondary | ICD-10-CM | POA: Diagnosis not present

## 2015-10-22 DIAGNOSIS — Z792 Long term (current) use of antibiotics: Secondary | ICD-10-CM | POA: Diagnosis not present

## 2015-10-22 DIAGNOSIS — Z79899 Other long term (current) drug therapy: Secondary | ICD-10-CM | POA: Diagnosis not present

## 2015-10-22 DIAGNOSIS — M545 Low back pain: Secondary | ICD-10-CM | POA: Diagnosis not present

## 2015-10-22 DIAGNOSIS — Z7982 Long term (current) use of aspirin: Secondary | ICD-10-CM | POA: Diagnosis not present

## 2015-10-22 DIAGNOSIS — Z853 Personal history of malignant neoplasm of breast: Secondary | ICD-10-CM | POA: Diagnosis not present

## 2015-10-22 DIAGNOSIS — N3000 Acute cystitis without hematuria: Secondary | ICD-10-CM | POA: Insufficient documentation

## 2015-10-22 DIAGNOSIS — R05 Cough: Secondary | ICD-10-CM | POA: Diagnosis not present

## 2015-10-22 LAB — BASIC METABOLIC PANEL
ANION GAP: 11 (ref 5–15)
BUN: 10 mg/dL (ref 6–20)
CALCIUM: 9.1 mg/dL (ref 8.9–10.3)
CO2: 26 mmol/L (ref 22–32)
CREATININE: 0.67 mg/dL (ref 0.44–1.00)
Chloride: 96 mmol/L — ABNORMAL LOW (ref 101–111)
Glucose, Bld: 109 mg/dL — ABNORMAL HIGH (ref 65–99)
Potassium: 3.9 mmol/L (ref 3.5–5.1)
Sodium: 133 mmol/L — ABNORMAL LOW (ref 135–145)

## 2015-10-22 LAB — CBC WITH DIFFERENTIAL/PLATELET
BASOS ABS: 0 10*3/uL (ref 0.0–0.1)
BASOS PCT: 0 %
EOS ABS: 0.1 10*3/uL (ref 0.0–0.7)
Eosinophils Relative: 1 %
HEMATOCRIT: 36.9 % (ref 36.0–46.0)
HEMOGLOBIN: 12.3 g/dL (ref 12.0–15.0)
Lymphocytes Relative: 26 %
Lymphs Abs: 2 10*3/uL (ref 0.7–4.0)
MCH: 31.1 pg (ref 26.0–34.0)
MCHC: 33.3 g/dL (ref 30.0–36.0)
MCV: 93.2 fL (ref 78.0–100.0)
MONO ABS: 0.8 10*3/uL (ref 0.1–1.0)
Monocytes Relative: 11 %
NEUTROS ABS: 4.8 10*3/uL (ref 1.7–7.7)
NEUTROS PCT: 62 %
Platelets: 252 10*3/uL (ref 150–400)
RBC: 3.96 MIL/uL (ref 3.87–5.11)
RDW: 14 % (ref 11.5–15.5)
WBC: 7.7 10*3/uL (ref 4.0–10.5)

## 2015-10-22 LAB — URINALYSIS, ROUTINE W REFLEX MICROSCOPIC
Bilirubin Urine: NEGATIVE
Glucose, UA: NEGATIVE mg/dL
Hgb urine dipstick: NEGATIVE
KETONES UR: NEGATIVE mg/dL
NITRITE: NEGATIVE
PH: 6.5 (ref 5.0–8.0)
PROTEIN: NEGATIVE mg/dL
Specific Gravity, Urine: 1.008 (ref 1.005–1.030)

## 2015-10-22 LAB — URINE MICROSCOPIC-ADD ON: RBC / HPF: NONE SEEN RBC/hpf (ref 0–5)

## 2015-10-22 MED ORDER — DEXTROSE 5 % IV SOLN
1.0000 g | Freq: Once | INTRAVENOUS | Status: AC
  Administered 2015-10-22: 1 g via INTRAVENOUS
  Filled 2015-10-22: qty 10

## 2015-10-22 MED ORDER — CEPHALEXIN 500 MG PO CAPS: 500.0000 mg | ORAL_CAPSULE | Freq: Two times a day (BID) | ORAL | 0 refills | Status: DC

## 2015-10-22 NOTE — ED Provider Notes (Signed)
[facility] DEPT Provider Note   CSN: [id number] Arrival date & time: 10/22/15  1021     History   Chief Complaint Chief Complaint  Patient presents with  . Weakness    HPI Cassandra Glenn is a 82 y.o. female.  HPI  82 year old female presents with generalized weakness. She states she has been feeling significantly weak and fatigued over the last 2 days. However she has not felt quite right over the last few days. Patient states she has been on amoxicillin for a pneumonia. She was surprised by this diagnosis given she has very mild cough. Was found on x-ray where she was also found to have 2 left anterior rib fractures that were atraumatic. The chest pain from those are gone. She has some low back pain over last several days but this is not atypical for her. She has been having burning just before urination. She normally self caths twice per day due to poor emptying. However when she urinates in between she has burning just prior to urination. Feels like multiple prior UTIs. This weakness feels similar to when she has had sodium problems in the past. Patient tells me she is eating and drinking normally. No current headaches.   Past Medical History:  Diagnosis Date  . Abnormalities of the hair 02/29/2012  . Arthritis   . Cancer ([place])   . Candidiasis of skin and nails 03/19/2011  . Closed fracture of base of neck of femur ([place]) 03/18/2011  . Contact dermatitis and other eczema, due to unspecified cause 05/07/2011  . Depression 07/23/2014  . Diverticulosis of colon (without mention of hemorrhage) 03/19/2011  . Edema 05/07/2011  . HOH (hard of hearing)   . Hypopotassemia 03/26/2011  . Hyposmolality and/or hyponatremia 03/19/2011  . Hypotension, unspecified 03/18/2011  . Impaired fasting glucose 03/19/2011  . Insomnia, unspecified 03/19/2011  . Kidney infection   . Lumbago 03/19/2011  . Macular degeneration   . Malignant neoplasm of breast (female), unspecified site 10/28/2002    . Osteoporosis, unspecified 03/19/2011  . Other malaise and fatigue 03/19/2011  . Pain in joint, pelvic region and thigh 03/19/2011  . Right bundle branch block 08/30/2011  . Shortness of breath   . Spasm of muscle 03/19/2011  . Spinal stenosis, lumbar region, with neurogenic claudication 11/02/2011  . Trigger finger (acquired) 11/02/2011  . Unspecified constipation 03/19/2011  . Vaginitis and vulvovaginitis 02/29/2012  . Vertigo 08/26/2015    Patient Active Problem List   Diagnosis Date Noted  . Vertigo 08/26/2015  . BPV (benign positional vertigo)   . Syncope 08/24/2015  . Lightheadedness   . Herpes simplex 07/29/2015  . Chorioretinal scar, macular 01/23/2015  . Osteoporosis 08/06/2014  . Depression 07/23/2014  . Candidiasis of skin 06/25/2014  . Constipation 06/04/2014  . Urinary retention 05/30/2014  . Primary osteoarthritis of right hip 05/28/2014  . Cancer antigen 125 (CA 125) elevation 11/27/2013  . IBS (irritable bowel syndrome) 11/27/2013  . Purpura senilis ([place]) 07/31/2013  . TMJ click [id number]  . Insomnia 04/24/2013  . Urinary frequency 04/24/2013  . Hyponatremia 04/19/2013  . Hyperglycemia 04/19/2013  . History of fall 04/17/2013  . UTI (urinary tract infection) 04/16/2013  . Abnormal CT scan, chest 03/23/2013  . Compression fracture of thoracic vertebra ([facility]) 11/24/2012  . AMD (age-related macular degeneration), wet ([place]) 05/27/2011  . History of surgical procedure 05/27/2011  . Closed fracture of base of neck of femur ([place]) 03/18/2011  . 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 Nonexudative age-related macular degeneration 12/10/2010  .  Posterior vitreous detachment 12/10/2010  . Cancer of breast, female ([place]) 10/27/2004    Past Surgical History:  Procedure Laterality Date  . BREAST SURGERY Bilateral 2005-10/27/2004   lumpectomy- Streck,MD  . CATARACT EXTRACTION  2010   bialteral  . EYE SURGERY     cataract  . FRACTURE SURGERY Left 09/1980   ankle   . HARDWARE REMOVAL Right 05/28/2014   Procedure: HARDWARE REMOVAL;  Surgeon: Melrose Nakayama, MD;  Location: [facility];  Service: Orthopedics;  Laterality: Right;  . HIP PINNING,CANNULATED  03/08/2011   Procedure: CANNULATED HIP PINNING;  Surgeon: Johnn Hai, MD;  Location: [facility];  Service: Orthopedics;  Laterality: Right;  . ORIF HIP FRACTURE Right 03/08/2011   Bean,MD  . SKIN CANCER EXCISION Left 10/12/2012   lower leg Dr. [place]  . SKIN LESION EXCISION Right 02/04/2011   Abdomen lesion spongiotic dermatitis-Taffeen, MD  . SQUAMOUS CELL CARCINOMA EXCISION Right 02/04/2011   forearmSyble Creek, MD  . TONSILLECTOMY    . TOTAL HIP ARTHROPLASTY Right 05/28/2014   Procedure: TOTAL HIP ARTHROPLASTY ANTERIOR APPROACH;  Surgeon: Melrose Nakayama, MD;  Location: [facility];  Service: Orthopedics;  Laterality: Right;    OB History    No data available       Home Medications    Prior to Admission medications   Medication Sig Start Date End Date Taking? Authorizing Provider  acetaminophen (TYLENOL) 500 MG tablet Take 1,000 mg by mouth every 6 (six) hours as needed for moderate pain.    Yes Historical Provider, MD  amoxicillin (AMOXIL) 875 MG tablet Take 1 tablet (875 mg total) by mouth 2 (two) times daily. 10/16/15  Yes Wardell Honour, MD  aspirin 81 MG chewable tablet Chew 81 mg by mouth every morning.    Yes Historical Provider, MD  Bilberry 1000 MG CAPS Take 1,000 mg by mouth every morning.    Yes Historical Provider, MD  Calcium Carbonate-Vitamin D (CALCIUM 600/VITAMIN D PO) Take 600 mg by mouth 2 (two) times daily.    Yes Historical Provider, MD  Calcium Polycarbophil (FIBERCON PO) Take 1 tablet by mouth 2 (two) times daily.    Yes Historical Provider, MD  cholecalciferol (VITAMIN D) 400 UNITS TABS Take 400 Units by mouth every morning.    Yes Historical Provider, MD  CRANBERRY EXTRACT PO Take 1 tablet by mouth every morning.    Yes Historical Provider, MD  fish oil-omega-3 fatty acids 1000 MG  capsule Take 1 g by mouth every morning.    Yes Historical Provider, MD  Glucosamine-Chondroitin (GLUCOSAMINE CHONDR COMPLEX PO) Take 1 tablet by mouth 2 (two) times daily. for joints   Yes Historical Provider, MD  meclizine (ANTIVERT) 25 MG tablet Take 1 tablet (25 mg total) by mouth 3 (three) times daily as needed for dizziness. 08/25/15  Yes Thurnell Lose, MD  Multiple Vitamins-Minerals (PRESERVISION/LUTEIN) CAPS Take 1 capsule by mouth 2 (two) times daily.   Yes Historical Provider, MD  nitrofurantoin (MACRODANTIN) 50 MG capsule Take 50 mg by mouth at bedtime.   Yes Historical Provider, MD  Probiotic Product (RESTORA) CAPS Take 1 capsule by mouth every morning.    Yes Historical Provider, MD  trimethoprim (TRIMPEX) 100 MG tablet Take 100 mg by mouth at bedtime. 10/02/15  Yes Historical Provider, MD  zolpidem (AMBIEN) 5 MG tablet Take one half tablet at bedtime if needed for rest Patient taking differently: Take 5 mg by mouth at bedtime as needed for sleep.  07/29/15  Yes Estill Dooms,  MD    Family History Family History  Problem Relation Age of Onset  . Heart disease Mother   . Cancer Father   . Emphysema Brother   . Alzheimer's disease Brother     Social History Social History  Substance Use Topics  . Smoking status: Never Smoker  . Smokeless tobacco: Never Used  . Alcohol use 0.0 oz/week     Comment: occasional glass     Allergies   Review of patient's allergies indicates no known allergies.   Review of Systems Review of Systems  Constitutional: Positive for fatigue. Negative for fever.  Respiratory: Negative for shortness of breath.   Cardiovascular: Negative for chest pain.  Gastrointestinal: Negative for abdominal pain.  Genitourinary: Positive for dysuria.  Musculoskeletal: Positive for back pain.  Neurological: Positive for weakness.  All other systems reviewed and are negative.    Physical Exam Updated Vital Signs BP (!) 128/54 (BP Location: Left Arm)    Pulse 72   Temp 98.5 F (36.9 C) (Oral)   Resp 18   SpO2 94%   Physical Exam  Constitutional: She is oriented to person, place, and time. She appears well-developed and well-nourished.  HENT:  Head: Normocephalic and atraumatic.  Right Ear: External ear normal.  Left Ear: External ear normal.  Nose: Nose normal.  Eyes: Right eye exhibits no discharge. Left eye exhibits no discharge.  Cardiovascular: Normal rate, regular rhythm and normal heart sounds.   Pulmonary/Chest: Effort normal and breath sounds normal.  Abdominal: Soft. There is tenderness in the suprapubic area.  Musculoskeletal:       Lumbar back: She exhibits tenderness (mild, midline).  Neurological: She is alert and oriented to person, place, and time.  CN 3-12 grossly intact. 5/5 strength in all 4 extremities. Grossly normal sensation. Normal finger to nose.   Skin: Skin is warm and dry.  Nursing note and vitals reviewed.    ED Treatments / Results  Labs (all labs ordered are listed, but only abnormal results are displayed) Labs Reviewed  URINALYSIS, ROUTINE W REFLEX MICROSCOPIC (NOT AT [facility]) - Abnormal; Notable for the following:       Result Value   APPearance CLOUDY (*)    Leukocytes, UA LARGE (*)    All other components within normal limits  URINE MICROSCOPIC-ADD ON - Abnormal; Notable for the following:    Squamous Epithelial / LPF 6-30 (*)    Bacteria, UA MANY (*)    All other components within normal limits  BASIC METABOLIC PANEL - Abnormal; Notable for the following:    Sodium 133 (*)    Chloride 96 (*)    Glucose, Bld 109 (*)    All other components within normal limits  URINE CULTURE  CBC WITH DIFFERENTIAL/PLATELET    EKG  EKG Interpretation None       Radiology Dg Chest 2 View  Result Date: 10/22/2015 CLINICAL DATA:  Cough. EXAM: CHEST  2 VIEW COMPARISON:  Radiographs of October 16, 2015. FINDINGS: Stable cardiomediastinal silhouette. Atherosclerosis of thoracic aorta is noted. Stable  old lower thoracic compression fracture is noted. Stable left apical scarring is noted. Right axillary surgical clips are noted. Right apical density is noted which may represent inflammation, but possible neoplasm cannot be excluded. Left lung is clear. IMPRESSION: Aortic atherosclerosis. Right apical density is noted concerning for inflammation or possible neoplasm. CT scan of the chest is recommended for further evaluation. Electronically Signed   By: Marijo Conception, M.D.   On: 10/22/2015 14:06  Dg Lumbar Spine Complete  Result Date: 10/22/2015 CLINICAL DATA:  Low back pain, no known injury EXAM: LUMBAR SPINE - COMPLETE 4+ VIEW COMPARISON:  10/16/2015 FINDINGS: Six views of the lumbar spine submitted. There is dextroscoliosis of the lumbar spine. Again noted significant compression deformity of T11 vertebral body. There is disc space flattening with anterior spurring at L1-L2-L2-L3 L3-L4 level. There is about 7 mm anterolisthesis L4 on L5 vertebral body. Significant disc space flattening at L4-L5 and L5-S1 level. There is disc space flattening with vacuum disc phenomenon and mild posterior spurring at T12-L1 level. Atherosclerotic calcifications of abdominal aorta and iliac arteries. No acute fracture or subluxation. IMPRESSION: Dextroscoliosis of the lumbar spine. Stable T11 compression fracture. Multilevel degenerative changes as described above. No acute fracture or subluxation. Electronically Signed   By: Lahoma Crocker M.D.   On: 10/22/2015 14:19    Procedures Procedures (including critical care time)  Medications Ordered in ED Medications  cefTRIAXone (ROCEPHIN) 1 g in dextrose 5 % 50 mL IVPB (1 g Intravenous New Bag/Given 10/22/15 1322)     Initial Impression / Assessment and Plan / ED Course  I have reviewed the triage vital signs and the nursing notes.  Pertinent labs & imaging results that were available during my care of the patient were reviewed by me and considered in my medical  decision making (see chart for details).  Clinical Course  Comment By Time  Will check labs, ECG, CXR. Urine appears like a UTI, which she gets frequently. Given she's symptomatic will treat with antibiotics.  Sherwood Gambler, MD 10/11 1257    Unclear exact etiology of her weakness but she does have a urinary tract infection which explant her lower abdominal pain and burning. She is overall well appearing for her age and has no acute neurologic dysfunction. She's able to sit up and stand on her own. At this point there is no indication for admission. Treat her UTI with keflex. Labs unremarkable. CXR with concern for inflammation vs cancer. Discussed this with patient and daughter, they will call PCP for outpatient CT. I do not think it is needed emergently and not pertaining to her current presentation.  Final Clinical Impressions(s) / ED Diagnoses   Final diagnoses:  Generalized weakness  Acute cystitis without hematuria    New Prescriptions Discharge Medication List as of 10/22/2015  2:49 PM    START taking these medications   Details  cephALEXin (KEFLEX) 500 MG capsule Take 1 capsule (500 mg total) by mouth 2 (two) times daily., Starting Wed 10/22/2015, Print         Sherwood Gambler, MD 10/22/15 [phone]

## 2015-10-22 NOTE — ED Notes (Signed)
Patient transported to X-ray 

## 2015-10-22 NOTE — Discharge Instructions (Signed)
Your Chest xray shows a spot that is concerning for inflammation or mass. Call your doctor today or tomorrow to arrange for an outpatient CT scan. Return if your weakness worsens or if you develop fevers, vomiting, or confusion

## 2015-10-22 NOTE — ED Triage Notes (Signed)
She reports being dx with pneumonia last Thurs. And was prescribed amoxicillin for same. She also has had u.t.i. Sx and "submitted a urine test at [facility]" this Mon. "but haven't heard back from that yet". She is in no distress. She tells [place] she (chronically) self-caths twice a day and hasn't cathed herself today.

## 2015-10-23 ENCOUNTER — Other Ambulatory Visit: Payer: Self-pay | Admitting: Internal Medicine

## 2015-10-23 ENCOUNTER — Telehealth: Payer: Self-pay

## 2015-10-23 DIAGNOSIS — R911 Solitary pulmonary nodule: Secondary | ICD-10-CM

## 2015-10-23 DIAGNOSIS — R918 Other nonspecific abnormal finding of lung field: Secondary | ICD-10-CM

## 2015-10-23 HISTORY — DX: Other nonspecific abnormal finding of lung field: R91.8

## 2015-10-23 NOTE — Telephone Encounter (Signed)
Patient's daughter was calling to request CT order to access mass in lung. Patient was seen in the ER and per discharge summary patient was instructed to call PCP to request order (see discharge summary)  Please place order

## 2015-10-23 NOTE — Telephone Encounter (Signed)
I entered order

## 2015-10-24 LAB — URINE CULTURE: Culture: 20000 — AB

## 2015-10-25 ENCOUNTER — Telehealth (HOSPITAL_BASED_OUTPATIENT_CLINIC_OR_DEPARTMENT_OTHER): Payer: Self-pay

## 2015-10-25 NOTE — Telephone Encounter (Signed)
Post ED Visit - Positive Culture Follow-up  Culture report reviewed by antimicrobial stewardship pharmacist:  []  Elenor Quinones, Pharm.D. []  Heide Guile, Pharm.D., BCPS []  Parks Neptune, Pharm.D. []  Alycia Rossetti, Pharm.D., BCPS []  [place], Pharm.D., BCPS, AAHIVP []  Legrand Como, Pharm.D., BCPS, AAHIVP []  Milus Glazier, Pharm.D. []  Stephens November, Pharm.D. Bryson Ha Master Pharm D Positive urine culture Treated with Cephalexin, organism sensitive to the same and no further patient follow-up is required at this time.  Genia Del 10/25/2015, 12:19 PM

## 2015-10-27 ENCOUNTER — Ambulatory Visit
Admission: RE | Admit: 2015-10-27 | Discharge: 2015-10-27 | Disposition: A | Discharge status: Home / Self Care | Payer: Medicare Other | Source: Ambulatory Visit | Attending: Internal Medicine | Admitting: Internal Medicine

## 2015-10-27 DIAGNOSIS — R918 Other nonspecific abnormal finding of lung field: Secondary | ICD-10-CM | POA: Diagnosis not present

## 2015-10-27 DIAGNOSIS — R911 Solitary pulmonary nodule: Secondary | ICD-10-CM

## 2015-10-28 DIAGNOSIS — N3000 Acute cystitis without hematuria: Secondary | ICD-10-CM | POA: Diagnosis not present

## 2015-10-28 DIAGNOSIS — N3010 Interstitial cystitis (chronic) without hematuria: Secondary | ICD-10-CM | POA: Diagnosis not present

## 2015-10-28 DIAGNOSIS — K753 Granulomatous hepatitis, not elsewhere classified: Secondary | ICD-10-CM | POA: Diagnosis not present

## 2015-10-28 DIAGNOSIS — N1330 Unspecified hydronephrosis: Secondary | ICD-10-CM | POA: Diagnosis not present

## 2015-10-29 ENCOUNTER — Ambulatory Visit (INDEPENDENT_AMBULATORY_CARE_PROVIDER_SITE_OTHER): Payer: Medicare Other | Admitting: Internal Medicine

## 2015-10-29 ENCOUNTER — Encounter: Payer: Self-pay | Admitting: Internal Medicine

## 2015-10-29 VITALS — BP 120/68 | HR 67 | Temp 98.0°F | Ht 61.0 in | Wt 141.0 lb

## 2015-10-29 DIAGNOSIS — R9389 Abnormal findings on diagnostic imaging of other specified body structures: Secondary | ICD-10-CM

## 2015-10-29 DIAGNOSIS — R938 Abnormal findings on diagnostic imaging of other specified body structures: Secondary | ICD-10-CM

## 2015-10-29 MED ORDER — NYSTATIN 100000 UNIT/GM EX POWD: CUTANEOUS | 4 refills | Status: DC

## 2015-10-29 NOTE — Progress Notes (Signed)
Facility  [facility]    Place of Service:   OFFICE    No Known Allergies  Chief Complaint  Patient presents with  . Medical Management of Chronic Issues    Discuss radiology results  . Follow-up    Daughter -Wilford Grist    HPI:  Seen at Urgent Care on 10/16/15 for left thoracic pain. Was thought to have bronchitis, but xray suggested left 5th and 6th rib and an opacity of the right lung. CT of the chest was recommended by the radiologist and was completed10/16/17. It confirmed a nodule in the right upper lungand possible mesothelioma of the mediastinum . PET scan was suggested by the radiologist.   I asked patient and daughter to come today to discuss pro and con of going further with the evaluation of the lung nodule.   She is currently on nitrofurantoin for UTI. Prescribed by her urologist.  She does not feel well. Denies fever or chills. No nausea. Was given Uribel yesterday which has hellped the dysuria.    Medications: Patient's Medications  New Prescriptions   No medications on file  Previous Medications   ACETAMINOPHEN (TYLENOL) 500 MG TABLET    Take 1,000 mg by mouth every 6 (six) hours as needed for moderate pain.    ASPIRIN 81 MG CHEWABLE TABLET    Chew 81 mg by mouth every morning.    BILBERRY 1000 MG CAPS    Take 1,000 mg by mouth every morning.    CALCIUM CARBONATE-VITAMIN D (CALCIUM 600/VITAMIN D PO)    Take 600 mg by mouth 2 (two) times daily.    CALCIUM POLYCARBOPHIL (FIBERCON PO)    Take 1 tablet by mouth 2 (two) times daily.    CHOLECALCIFEROL (VITAMIN D) 400 UNITS TABS    Take 400 Units by mouth every morning.    CIPROFLOXACIN (CIPRO PO)    Take by mouth 2 (two) times daily.   CONJUGATED ESTROGENS (PREMARIN) VAGINAL CREAM    Place 1 Applicatorful vaginally at bedtime.   CRANBERRY EXTRACT PO    Take 1 tablet by mouth every morning.    FISH OIL-OMEGA-3 FATTY ACIDS 1000 MG CAPSULE    Take 1 g by mouth every morning.    GLUCOSAMINE-CHONDROITIN (GLUCOSAMINE  CHONDR COMPLEX PO)    Take 1 tablet by mouth 2 (two) times daily. for joints   MECLIZINE (ANTIVERT) 25 MG TABLET    Take 1 tablet (25 mg total) by mouth 3 (three) times daily as needed for dizziness.   MULTIPLE VITAMINS-MINERALS (PRESERVISION/LUTEIN) CAPS    Take 1 capsule by mouth 2 (two) times daily.   NITROFURANTOIN (MACRODANTIN) 50 MG CAPSULE    Take 50 mg by mouth at bedtime.   PROBIOTIC PRODUCT (RESTORA) CAPS    Take 1 capsule by mouth every morning.    TRIMETHOPRIM (TRIMPEX) 100 MG TABLET    Take 100 mg by mouth at bedtime.   ZOLPIDEM (AMBIEN) 5 MG TABLET    Take one half tablet at bedtime if needed for rest  Modified Medications   No medications on file  Discontinued Medications   AMOXICILLIN (AMOXIL) 875 MG TABLET    Take 1 tablet (875 mg total) by mouth 2 (two) times daily.   CEPHALEXIN (KEFLEX) 500 MG CAPSULE    Take 1 capsule (500 mg total) by mouth 2 (two) times daily.    Review of Systems  Constitutional: Positive for activity change and appetite change. Negative for fatigue, fever and unexpected weight change.  HENT:  Positive for hearing loss (bilateral earing aides). Negative for ear pain.   Eyes:       History of loss of visual acuity and macular degeneration.  Respiratory:       History of breast discomfort. History of removal of a lump of cancer from the right breast. Dyspnea on exertion.  Cardiovascular: Positive for leg swelling. Negative for chest pain and palpitations.  Gastrointestinal:       She frequently complains that her intestines are not working right. There is a previous history of diverticulosis. Her last colonoscopy was in 2008. She has been seen by Dr. Paulita Fujita in the past. Constipation - she associates with Tramadol  Genitourinary: Positive for decreased urine volume, difficulty urinating and frequency. Negative for dysuria, vaginal bleeding and vaginal discharge.       Complains of a slow, weak urinary stream. Denies dysuria. Has increased frequency and  nocturia. Saw Dr. Louis Meckel, urologist. She was taught self-catheterization for a bladder that does not empty well. Vaginal discomfort. Managed by Dr. Claudean Kinds. History of elevated CEA 125. Normal ultrasound of the abdomen and genitourinary tract.  Musculoskeletal:       Chronic back pain.  Patient has a shorter right leg and has a lift in the right shoe. Now seeing Dr. Rhona Raider.  Right hip arthroplasty 5/16 There is generalized stiffness in the joints. There is a history of spinal stenosis. Left third finger catches on extension sometimes. There is a trigger finger. Pain at the left iliac crest and left anterior ribs near the sternum.  Skin:       History of skin discoloration and changes of nails.  Rash on the left scapula, right forearm, and left forearm.  Allergic/Immunologic: Negative.   Neurological: Positive for dizziness. Negative for tremors, seizures and numbness.       History of spinal stenosis with neurogenic claudication affecting the right leg. Complains of pain in the buttocks that is suggestive of neuralgia from her spinal stenosis. Hospitalized for 24 hours 08/24/2015 for vertigo.  Hematological: Bruises/bleeds easily.  Psychiatric/Behavioral: Negative.     Vitals:   10/29/15 1443  BP: 120/68  Pulse: 67  Temp: 98 F (36.7 C)  TempSrc: Oral  SpO2: 98%  Weight: 141 lb (64 kg)  Height: 5' 1"  (1.549 m)   Body mass index is 26.64 kg/m. Wt Readings from Last 3 Encounters:  10/29/15 141 lb (64 kg)  10/16/15 141 lb (64 kg)  08/26/15 140 lb (63.5 kg)      Physical Exam  Constitutional: She is oriented to person, place, and time. She appears well-developed and well-nourished. No distress.  HENT:  HOH Nonocclusive wax in both EAC.  Eyes:  Corrective lenses.  Neck: No JVD present. No tracheal deviation present. No thyromegaly present.  Cardiovascular: Normal rate, regular rhythm and normal heart sounds.  Exam reveals no gallop and no friction rub.   No murmur  heard. Pulses in RLE not palpable, has 2+ edema which could be obscuring pulses  Pulmonary/Chest: Effort normal and breath sounds normal. She has no rales.  Abdominal: Soft. Bowel sounds are normal. She exhibits no distension and no mass. There is no tenderness.  Musculoskeletal: Normal range of motion. She exhibits edema (RLE). She exhibits no tenderness.  Poor balance; using walker with PT following right hip replacement  Lymphadenopathy:    She has no cervical adenopathy.  Neurological: She is alert and oriented to person, place, and time. No cranial nerve deficit.  Skin: Skin is warm and dry. There  is pallor.  Ovoid reddish patch at the left scapula. I am unable to find any blisters on this patch. Right forearm has a larger firm lump which has a reddish covering. Nontender. Left forearm has a reddish patch, but no lump of tissue under the patch.  Psychiatric: She has a normal mood and affect. Her behavior is normal. Thought content normal.    Labs reviewed: Lab Summary Latest Ref Rng & Units 10/22/2015 09/22/2015 08/25/2015 08/24/2015  Hemoglobin 12.0 - 15.0 g/dL 12.3 13.7 (None) 12.4  Hematocrit 36.0 - 46.0 % 36.9 41 (None) 36.9  White count 4.0 - 10.5 K/uL 7.7 6.4 (None) 7.8  Platelet count 150 - 400 K/uL 252 277 (None) 235  Sodium 135 - 145 mmol/L 133(L) 136(A) 135 129(L)  Potassium 3.5 - 5.1 mmol/L 3.9 4.7 3.9 4.0  Calcium 8.9 - 10.3 mg/dL 9.1 (None) 8.9 9.3  Phosphorus - (None) (None) (None) (None)  Creatinine 0.44 - 1.00 mg/dL 0.67 1.0 0.57 0.71  AST 13 - 35 U/L (None) 19 (None) 24  Alk Phos 25 - 125 U/L (None) 165(A) (None) 119  Bilirubin 0.3 - 1.2 mg/dL (None) (None) (None) 0.5  Glucose 65 - 99 mg/dL 109(H) (None) 109(H) 132(H)  Cholesterol - (None) (None) (None) (None)  HDL cholesterol - (None) (None) (None) (None)  Triglycerides - (None) (None) (None) (None)  LDL Direct - (None) (None) (None) (None)  LDL Calc - (None) (None) (None) (None)  Total protein 6.5 - 8.1 g/dL  (None) (None) (None) 6.9  Albumin 3.5 - 5.0 g/dL (None) (None) (None) 4.1  Some recent data might be hidden   Lab Results  Component Value Date   TSH 3.215 08/25/2015   TSH 5.349 (H) 05/30/2014   TSH 0.911 11/23/2012   Lab Results  Component Value Date   BUN 10 10/22/2015   BUN 15 09/22/2015   BUN 10 08/25/2015   Lab Results  Component Value Date   HGBA1C 5.9 01/23/2015   Ct Chest Wo Contrast  Result Date: 10/27/2015 CLINICAL DATA:  Lung nodule noted on chest radiograph. Further evaluation requested. EXAM: CT CHEST WITHOUT CONTRAST TECHNIQUE: Multidetector CT imaging of the chest was performed following the standard protocol without IV contrast. COMPARISON:  CT of the chest performed 11/13/2013, and chest radiograph performed 10/22/2015 FINDINGS: Cardiovascular: Mild coronary artery calcification is noted. Scattered calcification is noted at the aortic valve. Scattered calcification is noted along the distal aortic arch and descending thoracic aorta. The heart is grossly unremarkable in appearance. The great vessels are unremarkable. Mediastinum/Nodes: The mediastinum is otherwise unremarkable in appearance. No mediastinal lymphadenopathy is seen. No pericardial effusion is identified. The thyroid gland is unremarkable. No axillary lymphadenopathy is appreciated. Postoperative change is noted at the right axilla. Lungs/Pleura: Chronic areas of scarring are noted in both lungs, similar in appearance to the prior study. There is underlying bronchiectasis at the right lung apex. Right middle lobe pleural thickening appears mildly worsened, and there also appears to be increased nodular pleural thickening along the azygoesophageal recess, tracking adjacent to the mid esophagus. This is suspicious for an indolent right-sided mesothelioma, with very gradual progression since 2015. A small cluster of nodules is apparently new from the prior study, at the medial right lung base, measuring up to 7 mm  in size. A trace right-sided pleural effusion is again noted. No pneumothorax is seen. Upper Abdomen: The visualized portions of the liver and the spleen are unremarkable in appearance. Mild haziness about the celiac trunk is nonspecific. Scattered gastric  and splenic varices are noted. Scattered calcification is noted along the proximal abdominal aorta. The proximal abdominal aorta is somewhat tortuous. The adrenal glands are unremarkable in appearance. There appears to be minimal left-sided hydronephrosis, of uncertain significance. Musculoskeletal: No acute osseous abnormalities are identified. There is stable severe chronic compression deformity of vertebral body T11. Degenerative change is noted at T12-L1, and there is diffuse osteopenia of visualized osseous structures. The visualized musculature is unremarkable in appearance. IMPRESSION: 1. Mildly worsened right middle lobe pleural thickening, and increased nodular pleural thickening also noted along the azygoesophageal recess, tracking adjacent to the mid esophagus. This is suspicious for an indolent right-sided mesothelioma, with very gradual progression since 2015. Small apparently new cluster of nodules at the medial right lung base, measuring up to 7 mm in size. This could be further assessed on PET/CT, as deemed clinically appropriate, to confirm the presence of malignancy. 2. Chronic areas of scarring in both lungs, similar in appearance to the prior study, with underlying bronchiectasis at the right lung apex. 3. Trace right-sided pleural effusion again noted. 4. Scattered gastric and splenic varices seen. 5. Scattered aortic atherosclerosis. 6. Minimal left-sided hydronephrosis, of uncertain significance. 7. Stable severe chronic compression deformity of vertebral body T11. Diffuse osteopenia of visualized osseous structures. Electronically Signed   By: Garald Balding M.D.   On: 10/27/2015 17:46   Assessment/Plan  1. Abnormal CT scan, chest See  report above. After much discussion, patient and daughter have elected to get the PET scan. I informed them that results could lead to more decisions such as a biopsy. I advised them that I would be willing to seek specialist consultation with Pulmonary at any point they desire.  - NM PET Image Initial (PI) Whole Body; Future

## 2015-11-05 ENCOUNTER — Other Ambulatory Visit: Payer: Self-pay

## 2015-11-05 DIAGNOSIS — N390 Urinary tract infection, site not specified: Secondary | ICD-10-CM

## 2015-11-05 DIAGNOSIS — R739 Hyperglycemia, unspecified: Secondary | ICD-10-CM

## 2015-11-05 NOTE — Addendum Note (Signed)
Addended by: Leigh Aurora C on: 11/05/2015 10:30 AM   Modules accepted: Orders

## 2015-11-06 ENCOUNTER — Other Ambulatory Visit: Payer: Self-pay

## 2015-11-06 DIAGNOSIS — R9389 Abnormal findings on diagnostic imaging of other specified body structures: Secondary | ICD-10-CM

## 2015-11-06 NOTE — Progress Notes (Signed)
Manuela Schwartz with [facility] Radiology scheduling left a message on triage voicemail stating that the current order for a PET scan was incorrect. She asked that a corrected order be sent ASAP because patient is scheduled for scan tomorrow. Manuela Schwartz stated that the correct scan was PET, Initial, skull to thigh.   This scan was ordered today.

## 2015-11-07 ENCOUNTER — Telehealth: Payer: Self-pay | Admitting: *Deleted

## 2015-11-07 ENCOUNTER — Encounter (HOSPITAL_COMMUNITY): Payer: Medicare Other

## 2015-11-07 ENCOUNTER — Encounter (HOSPITAL_COMMUNITY)
Admission: RE | Admit: 2015-11-07 | Discharge: 2015-11-07 | Disposition: A | Discharge status: Home / Self Care | Payer: Medicare Other | Source: Ambulatory Visit | Attending: Internal Medicine | Admitting: Internal Medicine

## 2015-11-07 DIAGNOSIS — R938 Abnormal findings on diagnostic imaging of other specified body structures: Secondary | ICD-10-CM | POA: Diagnosis not present

## 2015-11-07 DIAGNOSIS — R911 Solitary pulmonary nodule: Secondary | ICD-10-CM | POA: Diagnosis not present

## 2015-11-07 DIAGNOSIS — R9389 Abnormal findings on diagnostic imaging of other specified body structures: Secondary | ICD-10-CM

## 2015-11-07 LAB — GLUCOSE, CAPILLARY: GLUCOSE-CAPILLARY: 129 mg/dL — AB (ref 65–99)

## 2015-11-07 MED ORDER — FLUDEOXYGLUCOSE F - 18 (FDG) INJECTION
6.9300 | Freq: Once | INTRAVENOUS | Status: AC | PRN
  Administered 2015-11-07: 6.93 via INTRAVENOUS

## 2015-11-07 NOTE — Telephone Encounter (Signed)
CALL REPORT for pet scan, results in patient's chart.

## 2015-11-11 ENCOUNTER — Ambulatory Visit (INDEPENDENT_AMBULATORY_CARE_PROVIDER_SITE_OTHER): Payer: Medicare Other | Admitting: Internal Medicine

## 2015-11-11 ENCOUNTER — Encounter: Payer: Self-pay | Admitting: Internal Medicine

## 2015-11-11 VITALS — BP 132/68 | HR 65 | Temp 97.8°F | Ht 61.0 in | Wt 141.0 lb

## 2015-11-11 DIAGNOSIS — S300XXA Contusion of lower back and pelvis, initial encounter: Secondary | ICD-10-CM

## 2015-11-11 DIAGNOSIS — R948 Abnormal results of function studies of other organs and systems: Secondary | ICD-10-CM | POA: Insufficient documentation

## 2015-11-11 DIAGNOSIS — T148XXA Other injury of unspecified body region, initial encounter: Secondary | ICD-10-CM | POA: Insufficient documentation

## 2015-11-11 DIAGNOSIS — N390 Urinary tract infection, site not specified: Secondary | ICD-10-CM | POA: Diagnosis not present

## 2015-11-11 HISTORY — DX: Abnormal results of function studies of other organs and systems: R94.8

## 2015-11-11 NOTE — Patient Instructions (Signed)
I want to see you after your appointment with Dr. Jana Hakim. We will call after that appointment is made.Marland Kitchen

## 2015-11-11 NOTE — Progress Notes (Signed)
Facility  [place]    Place of Service:   OFFICE    No Known Allergies  Chief Complaint  Patient presents with  . Medical Management of Chronic Issues    Review PET scan, here with daughter [name]  . Fall    11/04/15 while changing clothes, fell to left side. Back still hurts    HPI:   Abnormal PET scan of mediastinum - possible metastatic disease in multiple areas. histry of malignant neoplasm of the breast in 2004. Has previously seen Dr. Jana Hakim  Contusion of the back at inferior edge of left scapula. Bruised and tender and about the sized of a hand.  Urinary tract infection without hematuria, site unspecified - dysuria and frequency  Medications: Patient's Medications  New Prescriptions   No medications on file  Previous Medications   ACETAMINOPHEN (TYLENOL) 500 MG TABLET    Take 1,000 mg by mouth every 6 (six) hours as needed for moderate pain.    ASPIRIN 81 MG CHEWABLE TABLET    Chew 81 mg by mouth every morning.    BILBERRY 1000 MG CAPS    Take 1,000 mg by mouth every morning.    CALCIUM CARBONATE-VITAMIN D (CALCIUM 600/VITAMIN D PO)    Take 600 mg by mouth 2 (two) times daily.    CALCIUM POLYCARBOPHIL (FIBERCON PO)    Take 1 tablet by mouth 2 (two) times daily.    CHOLECALCIFEROL (VITAMIN D) 400 UNITS TABS    Take 400 Units by mouth every morning.    CONJUGATED ESTROGENS (PREMARIN) VAGINAL CREAM    Place 1 Applicatorful vaginally at bedtime.   CRANBERRY EXTRACT PO    Take 1 tablet by mouth every morning.    FISH OIL-OMEGA-3 FATTY ACIDS 1000 MG CAPSULE    Take 1 g by mouth every morning.    GLUCOSAMINE-CHONDROITIN (GLUCOSAMINE CHONDR COMPLEX PO)    Take 1 tablet by mouth 2 (two) times daily. for joints   IBUPROFEN (ADVIL,MOTRIN) 200 MG TABLET    Take 200 mg by mouth. Take 2 tablets as needed for inflammatory pain   MECLIZINE (ANTIVERT) 25 MG TABLET    Take 1 tablet (25 mg total) by mouth 3 (three) times daily as needed for dizziness.   MULTIPLE VITAMINS-MINERALS  (PRESERVISION/LUTEIN) CAPS    Take 1 capsule by mouth 2 (two) times daily.   NYSTATIN (MYCOSTATIN/NYSTOP) POWDER    Apply to affected skin daily to treat yeast   PROBIOTIC PRODUCT (RESTORA) CAPS    Take 1 capsule by mouth every morning.    TRIMETHOPRIM (TRIMPEX) 100 MG TABLET    Take 100 mg by mouth at bedtime.   ZOLPIDEM (AMBIEN) 5 MG TABLET    Take one half tablet at bedtime if needed for rest  Modified Medications   No medications on file  Discontinued Medications   CIPROFLOXACIN (CIPRO PO)    Take by mouth 2 (two) times daily.   NITROFURANTOIN (MACRODANTIN) 50 MG CAPSULE    Take 50 mg by mouth at bedtime.    Review of Systems  Constitutional: Positive for activity change and appetite change. Negative for fatigue, fever and unexpected weight change.  HENT: Positive for hearing loss (bilateral earing aides). Negative for ear pain.   Eyes:       History of loss of visual acuity and macular degeneration.  Respiratory:       History of breast discomfort. History of removal of a lump of cancer from the right breast. Dyspnea on exertion.  Cardiovascular:  Positive for leg swelling. Negative for chest pain and palpitations.  Gastrointestinal:       She frequently complains that her intestines are not working right. There is a previous history of diverticulosis. Her last colonoscopy was in 2008. She has been seen by Dr. Paulita Fujita in the past. Constipation - she associates with Tramadol  Genitourinary: Positive for decreased urine volume, difficulty urinating and frequency. Negative for dysuria, vaginal bleeding and vaginal discharge.       Complains of a slow, weak urinary stream. Denies dysuria. Has increased frequency and nocturia. Saw Dr. Louis Meckel, urologist. She was taught self-catheterization for a bladder that does not empty well. Vaginal discomfort. Managed by Dr. Claudean Kinds. History of elevated CEA 125. Normal ultrasound of the abdomen and genitourinary tract.  Musculoskeletal:        Chronic back pain.  Patient has a shorter right leg and has a lift in the right shoe. Now seeing Dr. Rhona Raider.  Right hip arthroplasty 5/16 There is generalized stiffness in the joints. There is a history of spinal stenosis. Left third finger catches on extension sometimes. There is a trigger finger. Pain at the left iliac crest and left anterior ribs near the sternum.  Skin:       History of skin discoloration and changes of nails.  Rash on the left scapula, right forearm, and left forearm. [place] in late Oct 2017 and contused the left side of the back.  Allergic/Immunologic: Negative.   Neurological: Positive for dizziness. Negative for tremors, seizures and numbness.       History of spinal stenosis with neurogenic claudication affecting the right leg. Complains of pain in the buttocks that is suggestive of neuralgia from her spinal stenosis. Hospitalized for 24 hours 08/24/2015 for vertigo.  Hematological: Bruises/bleeds easily.  Psychiatric/Behavioral: Negative.     Vitals:   11/11/15 1346  BP: 132/68  Pulse: 65  Temp: 97.8 F (36.6 C)  TempSrc: Oral  SpO2: 96%  Weight: 141 lb (64 kg)  Height: 5' 1"  (1.549 m)   Body mass index is 26.64 kg/m. Wt Readings from Last 3 Encounters:  11/11/15 141 lb (64 kg)  10/29/15 141 lb (64 kg)  10/16/15 141 lb (64 kg)      Physical Exam  Constitutional: She is oriented to person, place, and time. She appears well-developed and well-nourished. No distress.  HENT:  HOH Nonocclusive wax in both EAC.  Eyes:  Corrective lenses.  Neck: No JVD present. No tracheal deviation present. No thyromegaly present.  Cardiovascular: Normal rate, regular rhythm and normal heart sounds.  Exam reveals no gallop and no friction rub.   No murmur heard. Pulses in RLE not palpable, has 2+ edema which could be obscuring pulses  Pulmonary/Chest: Effort normal and breath sounds normal. She has no rales.  Abdominal: Soft. Bowel sounds are normal. She  exhibits no distension and no mass. There is no tenderness.  Musculoskeletal: Normal range of motion. She exhibits edema (RLE). She exhibits no tenderness.  Poor balance; using walker with PT following right hip replacement  Lymphadenopathy:    She has no cervical adenopathy.  Neurological: She is alert and oriented to person, place, and time. No cranial nerve deficit.  Skin: Skin is warm and dry. There is pallor.  Ovoid reddish patch at the left scapula. I am unable to find any blisters on this patch. Right forearm has a larger firm lump which has a reddish covering. Nontender. Left forearm has a reddish patch, but no lump of tissue under  the patch. Left side of the back has a hand sized bruise sustained in a contusion after a fall.  Psychiatric: She has a normal mood and affect. Her behavior is normal. Thought content normal.    Labs reviewed: Lab Summary Latest Ref Rng & Units 10/22/2015 09/22/2015 08/25/2015 08/24/2015  Hemoglobin 12.0 - 15.0 g/dL 12.3 13.7 (None) 12.4  Hematocrit 36.0 - 46.0 % 36.9 41 (None) 36.9  White count 4.0 - 10.5 K/uL 7.7 6.4 (None) 7.8  Platelet count 150 - 400 K/uL 252 277 (None) 235  Sodium 135 - 145 mmol/L 133(L) 136(A) 135 129(L)  Potassium 3.5 - 5.1 mmol/L 3.9 4.7 3.9 4.0  Calcium 8.9 - 10.3 mg/dL 9.1 (None) 8.9 9.3  Phosphorus - (None) (None) (None) (None)  Creatinine 0.44 - 1.00 mg/dL 0.67 1.0 0.57 0.71  AST 13 - 35 U/L (None) 19 (None) 24  Alk Phos 25 - 125 U/L (None) 165(A) (None) 119  Bilirubin 0.3 - 1.2 mg/dL (None) (None) (None) 0.5  Glucose 65 - 99 mg/dL 109(H) (None) 109(H) 132(H)  Cholesterol - (None) (None) (None) (None)  HDL cholesterol - (None) (None) (None) (None)  Triglycerides - (None) (None) (None) (None)  LDL Direct - (None) (None) (None) (None)  LDL Calc - (None) (None) (None) (None)  Total protein 6.5 - 8.1 g/dL (None) (None) (None) 6.9  Albumin 3.5 - 5.0 g/dL (None) (None) (None) 4.1  Some recent data might be hidden   Lab  Results  Component Value Date   TSH 3.215 08/25/2015   TSH 5.349 (H) 05/30/2014   TSH 0.911 11/23/2012   Lab Results  Component Value Date   BUN 10 10/22/2015   BUN 15 09/22/2015   BUN 10 08/25/2015   Lab Results  Component Value Date   HGBA1C 5.9 01/23/2015   Nm Pet Image Initial (pi) Skull Base To Thigh  Result Date: 11/07/2015 CLINICAL DATA:  Initial treatment strategy for lung nodule. EXAM: NUCLEAR MEDICINE PET SKULL BASE TO THIGH TECHNIQUE: 6.9 mCi F-18 FDG was injected intravenously. Full-ring PET imaging was performed from the skull base to thigh after the radiotracer. CT data was obtained and used for attenuation correction and anatomic localization. FASTING BLOOD GLUCOSE:  Value: 129 mg/dl COMPARISON:  CT abdomen 10/28/2015, CT chest 10/27/2015 FINDINGS: NECK No hypermetabolic lymph nodes in the neck. CHEST Switch hypermetabolic RIGHT paratracheal lymph node 2 is minimally enlarged to Send 10 12 mm with intense metabolic activity (SUV max equal 8.5). No additional hypermetabolic mediastinal adenopathy. There is a in rim of metabolic activity along the pleural surface of the azygos esophageal region status with SUV max equal 1 S none 20 9 (image 62 of fused data set. Mild metabolic activity associated with scarring within the RIGHT upper lobe and RIGHT middle lobe. There is metabolic activity associated with healing fractures and subacute fractures of the LEFT lower ribs. ABDOMEN/PELVIS To intense metabolic activity associated the thickened LEFT adrenal gland with SUV max equal 10.8. Hypermetabolic activity associated the RIGHT adrenal gland SUV max equal 8 point 4. No abnormal metabolic activity liver. No hypermetabolic upper abdominal lymph nodes. There is single hypermetabolic pelvic lymph nodes measuring 7 mm on image 149, series 4 with SUV max equal 5.6. SKELETON No focal hypermetabolic activity to suggest skeletal metastasis.  IMPRESSION:  1. Unfortunately the patient has a pattern  of metabolic lesions which suggests metastatic disease.  2. Hypermetabolic RIGHT lower paratracheal lymph node is concerning for metastatic lesion.  3. Hypermetabolic thickening of the pleural surface along  azygos esophageal recess.  4. Hypermetabolic adrenal glands are concerning for metastatic lesion. The LEFT adrenal gland may be amenable to biopsy.  5. Single hypermetabolic small (65m) lymph node in the RIGHT pelvis. Findings concerning for a nodal metastasis although again unusual pattern.  These results will be called to the ordering clinician or representative by the Radiologist Assistant, and communication documented in the PACS or zVision Dashboard.  Electronically Signed   By: SSuzy BouchardM.D.   On: 11/07/2015 11:41   Assessment/Plan  1. Abnormal PET scan of mediastinum I think before more testing or biopsy is done to secure a tissue diagnosis, that she should see oncology for opinion. - Ambulatory referral to Oncology  2. Urinary tract infection without hematuria, site unspecified - Urine culture - Urinalysis  3. Contusion of lower back, initial encounter Should get better with time.

## 2015-11-12 ENCOUNTER — Ambulatory Visit (INDEPENDENT_AMBULATORY_CARE_PROVIDER_SITE_OTHER): Payer: Medicare Other | Admitting: Podiatry

## 2015-11-12 VITALS — Ht 61.0 in | Wt 141.0 lb

## 2015-11-12 DIAGNOSIS — B351 Tinea unguium: Secondary | ICD-10-CM

## 2015-11-12 DIAGNOSIS — M79676 Pain in unspecified toe(s): Secondary | ICD-10-CM | POA: Diagnosis not present

## 2015-11-12 LAB — URINALYSIS
Bilirubin Urine: NEGATIVE
GLUCOSE, UA: NEGATIVE
HGB URINE DIPSTICK: NEGATIVE
KETONES UR: NEGATIVE
Nitrite: NEGATIVE
PH: 6.5 (ref 5.0–8.0)
Protein, ur: NEGATIVE
SPECIFIC GRAVITY, URINE: 1.01 (ref 1.001–1.035)

## 2015-11-12 LAB — URINE CULTURE

## 2015-11-12 NOTE — Progress Notes (Signed)
   Subjective:    Patient ID: Cassandra Glenn, female    DOB: [birth year], 82 y.o.   MRN: [id number]  HPI this patient presents the office with chief complaint of pain for thick, ingrowing toenails, multiple toes on both feet. She says she has pain walking and wearing her shoes. She denies any drainage from the toe. She presents the office today for an evaluation and treatment of these long, ingrowing toenails, both feet    Review of Systems  HENT: Positive for hearing loss.        Objective:   Physical Exam GENERAL APPEARANCE: Alert, conversant. Appropriately groomed. No acute distress.  VASCULAR: Pedal pulses are  palpable at  [facility] and PT bilateral.  Capillary refill time is immediate to all digits,  Normal temperature gradient.   NEUROLOGIC: sensation is normal to 5.07 monofilament at 5/5 sites bilateral.  Light touch is intact bilateral, Muscle strength normal.  MUSCULOSKELETAL: acceptable muscle strength, tone and stability bilateral.  Intrinsic muscluature intact bilateral.  Rectus appearance of foot and digits noted bilateral.   DERMATOLOGIC: skin color, texture, and turgor are within normal limits.  No preulcerative lesions or ulcers  are seen, no interdigital maceration noted.  No open lesions present. . No drainage noted.  NAILS  Thick disfigured discolored pincer toenails both feet.         Assessment & Plan:  Pain due to onychomycosis \  IE  Debridement of nails  B/L  RTC 3 months.   Gardiner Barefoot DPM

## 2015-11-13 ENCOUNTER — Telehealth: Payer: Self-pay | Admitting: *Deleted

## 2015-11-13 NOTE — Telephone Encounter (Signed)
I am fine with the referral to Lung Navigator. Please contact her daughter for confirmation that this is the route she approves of. Patient is elderly and frail and family was quite happy with previous relationship with Dr. Jana Hakim  I had requested a review from Dr. Jana Hakim because I was not sure of the etiology of the abnormalities on the PET scan. Radiologist also stated that the left adrenal was likely accessible for biopsy, but I was not sure this was the best option.   Thanks for your assisitance.

## 2015-11-13 NOTE — Telephone Encounter (Signed)
This RN received call from [facility]  regarding referral sent by Dr Art Nyoka Cowden per prior pt of Dr Jannifer Rodney with abnormal PET and concern for recurrent breast cancer.  Post MD review of PET noted results more indicative for a Lung cancer primary - ideally best to refer to Lung Navigator for work up, including biopsy  and presentation at Lung tumor board.  If pt does not want to proceed with above recommendation Dr Jannifer Rodney would be glad to see as a NP for follow up.  This RN informed Jethro Bolus - referral nurse coordinator.  This note will be forwarded to her, Hinton Dyer RN navigator for Lung and Dr Nyoka Cowden for communication and appropriate follow up.

## 2015-11-13 NOTE — Telephone Encounter (Signed)
Oncology Nurse Navigator Documentation  Oncology Nurse Navigator Flowsheets 11/13/2015  Navigator Location [facility]  Navigator Encounter Type Telephone/I received a message from [place] RN regarding Ms. Moncur.  Looks like Ms. Rihn may have lung cancer according to imaging.  I called Ms. Wool and spoke with her daughter.  Ms. Cates would like to see Dr. Jana Hakim for follow up with her possible lung cancer.  I called Val and left a vm message regarding patient's wishes.    Telephone Outgoing Call  Treatment Phase Pre-Tx/Tx Discussion  Barriers/Navigation Needs Coordination of Care  Interventions Coordination of Care  Coordination of Care Appts  Acuity Level 2  Acuity Level 2 Assistance expediting appointments  Time Spent with Patient 30

## 2015-11-14 NOTE — Telephone Encounter (Signed)
Pt's dtr was contacted by Hinton Dyer RN Lung Navigator who discussed option of  proceeding thru the [facility] for work up and treatment discussion.  Per above  - dtr states goal is comfort care and they would like to see Dr Cassandra Glenn.  Appointment will be made with Dr Cassandra Glenn for above.  Thank you everyone for your input and assistance with this patient.

## 2015-11-17 ENCOUNTER — Other Ambulatory Visit: Payer: Self-pay | Admitting: Oncology

## 2015-11-18 ENCOUNTER — Other Ambulatory Visit: Payer: Self-pay | Admitting: Oncology

## 2015-11-18 ENCOUNTER — Telehealth: Payer: Self-pay | Admitting: Oncology

## 2015-11-18 NOTE — Telephone Encounter (Signed)
Tc to the pt's daughter to schedule an appt. Appt w/Magrinat scheduled for 11/10 at 3pm. Pt's daughter agreed. Aware to have her mom here by 245pm.

## 2015-11-21 ENCOUNTER — Ambulatory Visit (HOSPITAL_BASED_OUTPATIENT_CLINIC_OR_DEPARTMENT_OTHER): Payer: Medicare Other | Admitting: Oncology

## 2015-11-21 VITALS — BP 180/67 | HR 81 | Temp 97.4°F | Resp 18 | Ht 61.0 in | Wt 146.0 lb

## 2015-11-21 DIAGNOSIS — C7970 Secondary malignant neoplasm of unspecified adrenal gland: Secondary | ICD-10-CM | POA: Insufficient documentation

## 2015-11-21 DIAGNOSIS — R918 Other nonspecific abnormal finding of lung field: Secondary | ICD-10-CM

## 2015-11-21 DIAGNOSIS — E279 Disorder of adrenal gland, unspecified: Secondary | ICD-10-CM | POA: Diagnosis not present

## 2015-11-21 DIAGNOSIS — Z853 Personal history of malignant neoplasm of breast: Secondary | ICD-10-CM

## 2015-11-21 DIAGNOSIS — Z170 Estrogen receptor positive status [ER+]: Principal | ICD-10-CM

## 2015-11-21 DIAGNOSIS — C50911 Malignant neoplasm of unspecified site of right female breast: Secondary | ICD-10-CM

## 2015-11-21 DIAGNOSIS — C801 Malignant (primary) neoplasm, unspecified: Secondary | ICD-10-CM

## 2015-11-21 DIAGNOSIS — C7972 Secondary malignant neoplasm of left adrenal gland: Secondary | ICD-10-CM

## 2015-11-21 HISTORY — DX: Secondary malignant neoplasm of unspecified adrenal gland: C79.70

## 2015-11-21 NOTE — Progress Notes (Signed)
The drugs is already [facility]  [phone] [phone]     ID: Cassandra Glenn DOB: [birth year]  MR#: [id number]  [id number]  Patient Care Team: Estill Dooms, MD as PCP - General (Internal Medicine) Lasana Man Otho Darner, NP as Nurse Practitioner (Nurse Practitioner) Shon Hough, MD as Consulting Physician (Ophthalmology) Molli Posey, MD as Consulting Physician (Obstetrics and Gynecology) Gerarda Fraction, MD as Consulting Physician (Ophthalmology) Lorelle Gibbs, MD as Consulting Physician (Radiology) Melrose Nakayama, MD as Consulting Physician (Orthopedic Surgery) Gerarda Fraction, MD as Referring Physician (Ophthalmology) Ardis Hughs, MD as Attending Physician (Urology) Chauncey Cruel, MD as Consulting Physician (Oncology) Chauncey Cruel, MD OTHER MD:  CHIEF COMPLAINT: lung cancer?  CURRENT TREATMENT: Biopsy pending movement   BREAST CANCER HISTORY: Ancillary in approximately 1 13 years ago when she was diagnosed with right-sided invasive breast cancer, which was treated with radiation and tamoxifen for 5 years. She also had a noninvasive breast cancer removed from the left breast at the same time.  More recently Cassandra Glenn had a chest x-ray 10/22/2015 for evaluation of a cough. The film showed a variety of incidental findings including atherosclerosis of the thoracic aorta and an old lower thoracic compression fracture. There was also however a right apical density which required further evaluation. Accordingly on 10/27/2015 she had a chest CT scan which found chronic areas of scarring in both lungs, not significantly changed from November 2015. However right middle lobe pleural thickening appeared worse, and there was increased nodular pleural thickening along the azygo-esophageal recess, suggestive of an indolent mesothelioma. There was a small cluster of nodules at the medial right lung base which was new from the  prior study. There were no bony metastases but there were multiple chronic compression deformities and degenerative change.   Given these results, a PET scan was obtained 11/07/2015. There was a hypermetabolic right paratracheal lymph node measuring 1.2 cm, but no additional mediastinal adenopathy. Along the pleural surface of the azygo-esophageal region there was increased metabolic activity with an SUV max of 20.9. There was also intense metabolic activity associated with a thickened left adrenal gland, with an SUV max of 10.8. There was some hypermetabolic activity associated with the right adrenal gland. There were again no bony metastatic disease findings.  With this information the patient returns for further evaluation and treatment.  INTERVAL HISTORY: Cassandra Glenn was evaluated cancer clinic 11/21/2015 accompanied by her daughter [name]  REVIEW OF SYSTEMS: Cassandra Glenn still has a cough, and has had it for some months at least, that it is not constant, does not keep her up at night, and it is not at all productive. She has no pleurisy or hemoptysis problems. There has been no purulent sputum and no fever. She denies headaches, sudden visual changes, or significant nausea or vomiting problems, although occasionally she has a queasy stomach that she attributes to reflux. She fell about 3 or 4 weeks ago and thinks she may have cracked a rib at that time but that discomfort has resolved. She uses a walker all the time. She has an atonic bladder and self catheterizes herself twice daily. There has been no weight loss and no unusual fatigue. There have been no drenching sweats. She has a little rash under the breasts which she tells me it is not unusual for her. This usually clears with antifungal's. She wonders if her stomach is swelling. Aside from these issues a detailed review of systems today was stable   PAST  MEDICAL HISTORY: Past Medical History:  Diagnosis Date  . Abnormalities of the hair 02/29/2012   . Arthritis   . Cancer ([place])   . Candidiasis of skin and nails 03/19/2011  . Closed fracture of base of neck of femur ([place]) 03/18/2011  . Contact dermatitis and other eczema, due to unspecified cause 05/07/2011  . Depression 07/23/2014  . Diverticulosis of colon (without mention of hemorrhage) 03/19/2011  . Edema 05/07/2011  . HOH (hard of hearing)   . Hypopotassemia 03/26/2011  . Hyposmolality and/or hyponatremia 03/19/2011  . Hypotension, unspecified 03/18/2011  . Impaired fasting glucose 03/19/2011  . Insomnia, unspecified 03/19/2011  . Kidney infection   . Lumbago 03/19/2011  . Macular degeneration   . Malignant neoplasm of breast (female), unspecified site 10/28/2002  . Osteoporosis, unspecified 03/19/2011  . Other malaise and fatigue 03/19/2011  . Pain in joint, pelvic region and thigh 03/19/2011  . Right bundle branch block 08/30/2011  . Shortness of breath   . Spasm of muscle 03/19/2011  . Spinal stenosis, lumbar region, with neurogenic claudication 11/02/2011  . Trigger finger (acquired) 11/02/2011  . Unspecified constipation 03/19/2011  . Vaginitis and vulvovaginitis 02/29/2012  . Vertigo 08/26/2015    PAST SURGICAL HISTORY: Past Surgical History:  Procedure Laterality Date  . BREAST SURGERY Bilateral 2005-10/27/2004   lumpectomy- Streck,MD  . CATARACT EXTRACTION  2010   bialteral  . EYE SURGERY     cataract  . FRACTURE SURGERY Left 09/1980   ankle  . HARDWARE REMOVAL Right 05/28/2014   Procedure: HARDWARE REMOVAL;  Surgeon: Melrose Nakayama, MD;  Location: [facility];  Service: Orthopedics;  Laterality: Right;  . HIP PINNING,CANNULATED  03/08/2011   Procedure: CANNULATED HIP PINNING;  Surgeon: Johnn Hai, MD;  Location: [facility];  Service: Orthopedics;  Laterality: Right;  . ORIF HIP FRACTURE Right 03/08/2011   Bean,MD  . SKIN CANCER EXCISION Left 10/12/2012   lower leg Dr. [place]  . SKIN LESION EXCISION Right 02/04/2011   Abdomen lesion spongiotic  dermatitis-Taffeen, MD  . SQUAMOUS CELL CARCINOMA EXCISION Right 02/04/2011   forearmSyble Creek, MD  . TONSILLECTOMY    . TOTAL HIP ARTHROPLASTY Right 05/28/2014   Procedure: TOTAL HIP ARTHROPLASTY ANTERIOR APPROACH;  Surgeon: Melrose Nakayama, MD;  Location: [facility];  Service: Orthopedics;  Laterality: Right;    FAMILY HISTORY Family History  Problem Relation Age of Onset  . Heart disease Mother   . Cancer Father   . Emphysema Brother   . Alzheimer's disease Brother   The patient's father died at the age of 73 with colon cancer. The patient's mother died at the age of 45 from heart disease the patient had 2 brothers, no sisters. Both brothers have died, one from emphysema and one from dementia.  GYNECOLOGIC HISTORY:  No LMP recorded. Patient is postmenopausal. Cassandra Glenn age 40, first live birth age 25, the patient is [place] P2. She stopped having periods around age 87. She never took hormone replacement.  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 She is widowed. She lives in independent living section of friend's home [place]. Her daughter Mardene Celeste lives in [place] (actually [place]) where she teaches. Daughter [name] lives in [place] and is Web designer at [employer]. The patient has 5 grandchildren and 2 step grandchildren. She has one great grandchild and one on the way. She attends [employer]    ADVANCED DIRECTIVES: Cassandra Glenn has named both her daughters as joint  health care power of attorney. She tells me she has a living will "somewhere in the house" and has a DO NOT RESUSCITATE order written at friends homes.   HEALTH MAINTENANCE: Social History  Substance Use Topics  . Smoking status: Never Smoker  . Smokeless tobacco: Never Used  . Alcohol use 0.0 oz/week     Comment: occasional glass     Colonoscopy:  PAP:  Bone density:   No Known Allergies  Current Outpatient  Prescriptions  Medication Sig Dispense Refill  . aspirin 81 MG chewable tablet Chew 81 mg by mouth every morning.     . Bilberry 1000 MG CAPS Take 1,000 mg by mouth every morning.     . Calcium Carbonate-Vitamin D (CALCIUM 600/VITAMIN D PO) Take 600 mg by mouth 2 (two) times daily.     . Calcium Polycarbophil (FIBERCON PO) Take 1 tablet by mouth 2 (two) times daily.     . cholecalciferol (VITAMIN D) 400 UNITS TABS Take 400 Units by mouth every morning.     Marland Kitchen CRANBERRY EXTRACT PO Take 1 tablet by mouth every morning.     . fish oil-omega-3 fatty acids 1000 MG capsule Take 1 g by mouth every morning.     . Glucosamine-Chondroitin (GLUCOSAMINE CHONDR COMPLEX PO) Take 1 tablet by mouth 2 (two) times daily. for joints    . ibuprofen (ADVIL,MOTRIN) 200 MG tablet Take 200 mg by mouth. Take 2 tablets as needed for inflammatory pain    . meclizine (ANTIVERT) 25 MG tablet Take 1 tablet (25 mg total) by mouth 3 (three) times daily as needed for dizziness. 30 tablet 0  . Multiple Vitamins-Minerals (PRESERVISION/LUTEIN) CAPS Take 1 capsule by mouth 2 (two) times daily.    Marland Kitchen nystatin (MYCOSTATIN/NYSTOP) powder Apply to affected skin daily to treat yeast 45 g 4  . Probiotic Product (RESTORA) CAPS Take 1 capsule by mouth every morning.     . trimethoprim (TRIMPEX) 100 MG tablet Take 100 mg by mouth at bedtime.    Marland Kitchen zolpidem (AMBIEN) 5 MG tablet Take one half tablet at bedtime if needed for rest 15 tablet 5  . acetaminophen (TYLENOL) 500 MG tablet Take 1,000 mg by mouth every 6 (six) hours as needed for moderate pain.      No current facility-administered medications for this visit.     OBJECTIVE: Older white woman using a walker Vitals:   11/21/15 1505  BP: (!) 180/67  Pulse: 81  Resp: 18  Temp: 97.4 F (36.3 C)     Body mass index is 27.59 kg/m.    ECOG FS:2 - Symptomatic, <50% confined to bed  Ocular: Sclerae unicteric, EOMs intact Ear-nose-throat: Oropharynx clear and moist Lymphatic: No  cervical or supraclavicular adenopathy Lungs no rales or rhonchi, no wheezes; no cough observed during exam Heart regular rate and rhythm Abd soft, nontender, positive bowel sounds, no masses palpated MSK kyphosis and scoliosis but no focal spinal tenderness to mild palpation Neuro: non-focal, well-oriented, appropriate affect Breasts: The right breast is status post lumpectomy and radiation. There is some distortion of the breast contour. There is no evidence of local recurrence. The right axilla is benign. The left breast is unremarkable   LAB RESULTS:  CMP     Component Value Date/Time   NA 133 (L) 10/22/2015 1320   NA 136 (A) 09/22/2015   K 3.9 10/22/2015 1320   CL 96 (L) 10/22/2015 1320   CO2 26 10/22/2015 1320   GLUCOSE 109 (H) 10/22/2015 1320  BUN 10 10/22/2015 1320   BUN 15 09/22/2015   CREATININE 0.67 10/22/2015 1320   CREATININE 0.86 07/03/2015 1203   CALCIUM 9.1 10/22/2015 1320   PROT 6.9 08/24/2015 1109   ALBUMIN 4.1 08/24/2015 1109   AST 19 09/22/2015   ALT 11 09/22/2015   ALKPHOS 165 (A) 09/22/2015   BILITOT 0.5 08/24/2015 1109   GFRNONAA >60 10/22/2015 1320   GFRNONAA 59 (L) 07/03/2015 1203   GFRAA >60 10/22/2015 1320   GFRAA 68 07/03/2015 1203    INo results found for: SPEP, UPEP  Lab Results  Component Value Date   WBC 7.7 10/22/2015   NEUTROABS 4.8 10/22/2015   HGB 12.3 10/22/2015   HCT 36.9 10/22/2015   MCV 93.2 10/22/2015   PLT 252 10/22/2015      Chemistry      Component Value Date/Time   NA 133 (L) 10/22/2015 1320   NA 136 (A) 09/22/2015   K 3.9 10/22/2015 1320   CL 96 (L) 10/22/2015 1320   CO2 26 10/22/2015 1320   BUN 10 10/22/2015 1320   BUN 15 09/22/2015   CREATININE 0.67 10/22/2015 1320   CREATININE 0.86 07/03/2015 1203   GLU 90 07/24/2015      Component Value Date/Time   CALCIUM 9.1 10/22/2015 1320   ALKPHOS 165 (A) 09/22/2015   AST 19 09/22/2015   ALT 11 09/22/2015   BILITOT 0.5 08/24/2015 1109       Lab Results    Component Value Date   LABCA2 19 11/07/2007    No components found for: [id number]  No results for input(s): INR in the last 168 hours.  Urinalysis    Component Value Date/Time   COLORURINE YELLOW 11/11/2015 1420   APPEARANCEUR CLEAR 11/11/2015 1420   LABSPEC 1.010 11/11/2015 1420   PHURINE 6.5 11/11/2015 1420   GLUCOSEU NEGATIVE 11/11/2015 1420   HGBUR NEGATIVE 11/11/2015 1420   BILIRUBINUR NEGATIVE 11/11/2015 1420   BILIRUBINUR negative 07/03/2015 1208   BILIRUBINUR neg 08/23/2014 1539   KETONESUR NEGATIVE 11/11/2015 1420   PROTEINUR NEGATIVE 11/11/2015 1420   UROBILINOGEN 0.2 07/03/2015 1208   UROBILINOGEN 0.2 05/30/2014 1111   NITRITE NEGATIVE 11/11/2015 1420   LEUKOCYTESUR 2+ (A) 11/11/2015 1420     STUDIES: Ct Chest Wo Contrast  Result Date: 10/27/2015 CLINICAL DATA:  Lung nodule noted on chest radiograph. Further evaluation requested. EXAM: CT CHEST WITHOUT CONTRAST TECHNIQUE: Multidetector CT imaging of the chest was performed following the standard protocol without IV contrast. COMPARISON:  CT of the chest performed 11/13/2013, and chest radiograph performed 10/22/2015 FINDINGS: Cardiovascular: Mild coronary artery calcification is noted. Scattered calcification is noted at the aortic valve. Scattered calcification is noted along the distal aortic arch and descending thoracic aorta. The heart is grossly unremarkable in appearance. The great vessels are unremarkable. Mediastinum/Nodes: The mediastinum is otherwise unremarkable in appearance. No mediastinal lymphadenopathy is seen. No pericardial effusion is identified. The thyroid gland is unremarkable. No axillary lymphadenopathy is appreciated. Postoperative change is noted at the right axilla. Lungs/Pleura: Chronic areas of scarring are noted in both lungs, similar in appearance to the prior study. There is underlying bronchiectasis at the right lung apex. Right middle lobe pleural thickening appears mildly worsened, and  there also appears to be increased nodular pleural thickening along the azygoesophageal recess, tracking adjacent to the mid esophagus. This is suspicious for an indolent right-sided mesothelioma, with very gradual progression since 2015. A small cluster of nodules is apparently new from the prior study, at the medial right  lung base, measuring up to 7 mm in size. A trace right-sided pleural effusion is again noted. No pneumothorax is seen. Upper Abdomen: The visualized portions of the liver and the spleen are unremarkable in appearance. Mild haziness about the celiac trunk is nonspecific. Scattered gastric and splenic varices are noted. Scattered calcification is noted along the proximal abdominal aorta. The proximal abdominal aorta is somewhat tortuous. The adrenal glands are unremarkable in appearance. There appears to be minimal left-sided hydronephrosis, of uncertain significance. Musculoskeletal: No acute osseous abnormalities are identified. There is stable severe chronic compression deformity of vertebral body T11. Degenerative change is noted at T12-L1, and there is diffuse osteopenia of visualized osseous structures. The visualized musculature is unremarkable in appearance. IMPRESSION: 1. Mildly worsened right middle lobe pleural thickening, and increased nodular pleural thickening also noted along the azygoesophageal recess, tracking adjacent to the mid esophagus. This is suspicious for an indolent right-sided mesothelioma, with very gradual progression since 2015. Small apparently new cluster of nodules at the medial right lung base, measuring up to 7 mm in size. This could be further assessed on PET/CT, as deemed clinically appropriate, to confirm the presence of malignancy. 2. Chronic areas of scarring in both lungs, similar in appearance to the prior study, with underlying bronchiectasis at the right lung apex. 3. Trace right-sided pleural effusion again noted. 4. Scattered gastric and splenic varices  seen. 5. Scattered aortic atherosclerosis. 6. Minimal left-sided hydronephrosis, of uncertain significance. 7. Stable severe chronic compression deformity of vertebral body T11. Diffuse osteopenia of visualized osseous structures. Electronically Signed   By: Garald Balding M.D.   On: 10/27/2015 17:46   Nm Pet Image Initial (pi) Skull Base To Thigh  Result Date: 11/07/2015 CLINICAL DATA:  Initial treatment strategy for lung nodule. EXAM: NUCLEAR MEDICINE PET SKULL BASE TO THIGH TECHNIQUE: 6.9 mCi F-18 FDG was injected intravenously. Full-ring PET imaging was performed from the skull base to thigh after the radiotracer. CT data was obtained and used for attenuation correction and anatomic localization. FASTING BLOOD GLUCOSE:  Value: 129 mg/dl COMPARISON:  CT abdomen 10/28/2015, CT chest 10/27/2015 FINDINGS: NECK No hypermetabolic lymph nodes in the neck. CHEST Switch hypermetabolic RIGHT paratracheal lymph node 2 is minimally enlarged to Send 10 12 mm with intense metabolic activity (SUV max equal 8.5). No additional hypermetabolic mediastinal adenopathy. There is a in rim of metabolic activity along the pleural surface of the azygos esophageal region status with SUV max equal 1 S none 20 9 (image 62 of fused data set. Mild metabolic activity associated with scarring within the RIGHT upper lobe and RIGHT middle lobe. There is metabolic activity associated with healing fractures and subacute fractures of the LEFT lower ribs. ABDOMEN/PELVIS To intense metabolic activity associated the thickened LEFT adrenal gland with SUV max equal 10.8. Hypermetabolic activity associated the RIGHT adrenal gland SUV max equal 8 point 4. No abnormal metabolic activity liver. No hypermetabolic upper abdominal lymph nodes. There is single hypermetabolic pelvic lymph nodes measuring 7 mm on image 149, series 4 with SUV max equal 5.6. SKELETON No focal hypermetabolic activity to suggest skeletal metastasis. IMPRESSION: 1. Unfortunately  the patient has a pattern of metabolic lesions which suggests metastatic disease. 2. Hypermetabolic RIGHT lower paratracheal lymph node is concerning for metastatic lesion. 3. Hypermetabolic thickening of the pleural surface along azygos esophageal recess. 4. Hypermetabolic adrenal glands are concerning for metastatic lesion. The LEFT adrenal gland may be amenable to biopsy. 5. Single hypermetabolic small (79mm) lymph node in the RIGHT pelvis. Findings  concerning for a nodal metastasis although again unusual pattern. These results will be called to the ordering clinician or representative by the Radiologist Assistant, and communication documented in the PACS or zVision Dashboard. Electronically Signed   By: Suzy Bouchard M.D.   On: 11/07/2015 11:41    ELIGIBLE FOR AVAILABLE RESEARCH PROTOCOL: no  ASSESSMENT: 82 y.o. [place] woman residing at friends homes [place]  (1) status post right lumpectomy 2004 for an invasive breast cancer, treated with radiation and tamoxifen for 5 years  (a) Also status post left lumpectomy for ductal carcinoma in situ  (2) chest CT scan and PET scan obtained October 2017 show a hot close lobe 1.2 cm right paratracheal lymph node, a rim of activity along the pleural surface of the as ago esophageal region, and bilateral adrenal metastases, left greater than right   PLAN: We spent the better part of today's hour-long appointment discussing the biology of cancer in general, and the specifics of the patient's tumor in particular. Cassandra Glenn is aware that the findings on the recent scans are new as compared to CT scans in 2015. If she had a history of asbestos exposure in the lung findings would be consistent with a mesothelioma. However there is no such history. She does have a history of breast cancer in the past but this would be a very unusual form of breast cancer spread on lesser original breast cancer was lobular, which is now not ascertainable.  We discussed the fact that  this is unlikely to be an infection since she has no productive cough and no fever.  Radiologist suggests a left adrenal biopsy if we wish to determine the nature of this process. We discussed that at length today. Cassandra Glenn already has a DO NOT RESUSCITATE in place, as well as a living well. However she is mentally very clear and does not have an other immediately life-threatening process ongoing. She would be willing to accept treatment for this cancer, if this is a cancer, assuming it did not affect her quality of life in a major way. For example she would accept anti-estrogens, or targeted therapy if this proves to be an adenocarcinoma of the lung with favorable mutations.  In short she is agreeable to a left adrenal biopsy and we are placing that request to interventional radiology. Tentatively I have made Cassandra Glenn a return appointment with me in 2 weeks but we will postpone that as needed so that we can have the necessary information (which may require molecular or genetic studies) to make a final decision regarding whether treatment is possible or desirable.  Cassandra Glenn has a good understanding of the overall plan. She agrees with it. She will call with any problems that may develop before her next visit here.  Chauncey Cruel, MD   11/21/2015 4:26 PM Medical Oncology and Hematology [facility] [address] [place], [place] [address] Tel. [phone]    Fax. [phone]

## 2015-11-28 ENCOUNTER — Telehealth: Payer: Self-pay | Admitting: Oncology

## 2015-11-28 NOTE — Telephone Encounter (Signed)
SPOKE WITH DTR RE APPOINTMENTS FOR 11/21 AND 12/1.

## 2015-12-01 ENCOUNTER — Other Ambulatory Visit: Payer: Self-pay | Admitting: Radiology

## 2015-12-02 ENCOUNTER — Other Ambulatory Visit: Payer: Self-pay | Admitting: Radiology

## 2015-12-02 ENCOUNTER — Other Ambulatory Visit (HOSPITAL_BASED_OUTPATIENT_CLINIC_OR_DEPARTMENT_OTHER): Payer: Medicare Other

## 2015-12-02 ENCOUNTER — Non-Acute Institutional Stay (INDEPENDENT_AMBULATORY_CARE_PROVIDER_SITE_OTHER): Payer: Medicare Other | Admitting: Internal Medicine

## 2015-12-02 ENCOUNTER — Encounter: Payer: Self-pay | Admitting: Internal Medicine

## 2015-12-02 VITALS — BP 114/74 | HR 62 | Temp 97.8°F | Ht 61.0 in | Wt 139.0 lb

## 2015-12-02 DIAGNOSIS — C50911 Malignant neoplasm of unspecified site of right female breast: Secondary | ICD-10-CM

## 2015-12-02 DIAGNOSIS — Z170 Estrogen receptor positive status [ER+]: Principal | ICD-10-CM

## 2015-12-02 DIAGNOSIS — C7972 Secondary malignant neoplasm of left adrenal gland: Secondary | ICD-10-CM | POA: Diagnosis not present

## 2015-12-02 DIAGNOSIS — R948 Abnormal results of function studies of other organs and systems: Secondary | ICD-10-CM

## 2015-12-02 DIAGNOSIS — N390 Urinary tract infection, site not specified: Secondary | ICD-10-CM

## 2015-12-02 DIAGNOSIS — S300XXD Contusion of lower back and pelvis, subsequent encounter: Secondary | ICD-10-CM

## 2015-12-02 DIAGNOSIS — R971 Elevated cancer antigen 125 [CA 125]: Secondary | ICD-10-CM | POA: Diagnosis not present

## 2015-12-02 DIAGNOSIS — C50419 Malignant neoplasm of upper-outer quadrant of unspecified female breast: Secondary | ICD-10-CM | POA: Diagnosis not present

## 2015-12-02 DIAGNOSIS — C801 Malignant (primary) neoplasm, unspecified: Secondary | ICD-10-CM

## 2015-12-02 DIAGNOSIS — R918 Other nonspecific abnormal finding of lung field: Secondary | ICD-10-CM | POA: Diagnosis not present

## 2015-12-02 LAB — COMPREHENSIVE METABOLIC PANEL
ALBUMIN: 3.7 g/dL (ref 3.5–5.0)
ALK PHOS: 148 U/L (ref 40–150)
ALT: 18 U/L (ref 0–55)
ANION GAP: 9 meq/L (ref 3–11)
AST: 21 U/L (ref 5–34)
BUN: 14.7 mg/dL (ref 7.0–26.0)
CALCIUM: 10.2 mg/dL (ref 8.4–10.4)
CO2: 25 mEq/L (ref 22–29)
Chloride: 99 mEq/L (ref 98–109)
Creatinine: 0.8 mg/dL (ref 0.6–1.1)
EGFR: 64 mL/min/{1.73_m2} — AB (ref >90)
Glucose: 102 mg/dl (ref 70–140)
POTASSIUM: 4.3 meq/L (ref 3.5–5.1)
Sodium: 134 mEq/L — ABNORMAL LOW (ref 136–145)
Total Bilirubin: 0.42 mg/dL (ref 0.20–1.20)
Total Protein: 7 g/dL (ref 6.4–8.3)

## 2015-12-02 LAB — CBC WITH DIFFERENTIAL/PLATELET
BASO%: 0.4 % (ref 0.0–2.0)
BASOS ABS: 0 10*3/uL (ref 0.0–0.1)
EOS ABS: 0.2 10*3/uL (ref 0.0–0.5)
EOS%: 3.1 % (ref 0.0–7.0)
HEMATOCRIT: 37 % (ref 34.8–46.6)
HEMOGLOBIN: 12.2 g/dL (ref 11.6–15.9)
LYMPH%: 30 % (ref 14.0–49.7)
MCH: 31 pg (ref 25.1–34.0)
MCHC: 33 g/dL (ref 31.5–36.0)
MCV: 94.1 fL (ref 79.5–101.0)
MONO#: 0.6 10*3/uL (ref 0.1–0.9)
MONO%: 8.8 % (ref 0.0–14.0)
NEUT#: 3.9 10*3/uL (ref 1.5–6.5)
NEUT%: 57.7 % (ref 38.4–76.8)
PLATELETS: 216 10*3/uL (ref 145–400)
RBC: 3.93 10*6/uL (ref 3.70–5.45)
RDW: 14.3 % (ref 11.2–14.5)
WBC: 6.7 10*3/uL (ref 3.9–10.3)
lymph#: 2 10*3/uL (ref 0.9–3.3)

## 2015-12-02 LAB — PROTIME-INR
INR: 0.9 — ABNORMAL LOW (ref 2.00–3.50)
Protime: 10.8 Seconds (ref 10.6–13.4)

## 2015-12-02 LAB — CEA (IN HOUSE-CHCC): CEA (CHCC-IN HOUSE): 1.45 ng/mL (ref 0.00–5.00)

## 2015-12-02 NOTE — Progress Notes (Signed)
[place] [facility] Service: Clinic (12)     No Known Allergies  Chief Complaint  Patient presents with  . Medical Management of Chronic Issues    follow up after Dr. Jana Hakim visit. Here with Cassandra Glenn    HPI:   Saw Dr. Jana Hakim on 11/21/15 for consultation about the abnormal PET scan and tumor in the chest. She was agreeable to a left adrenal biopsy. This is scheduled to e done on 12/03/15.  Contusion of the back. - most of the discomfort is resolved  UTI - some stinging and burning, but she thinks this could be irritation of the vaginal tissue as opposed to another UTI  Medications: Patient's Medications  New Prescriptions   No medications on file  Previous Medications   ACETAMINOPHEN (TYLENOL) 500 MG TABLET    Take 1,000 mg by mouth every 6 (six) hours as needed for moderate pain.    ASPIRIN 81 MG CHEWABLE TABLET    Chew 81 mg by mouth every morning.    BILBERRY 1000 MG CAPS    Take 1,000 mg by mouth every morning.    CALCIUM CARBONATE-VITAMIN D (CALCIUM 600/VITAMIN D PO)    Take 600 mg by mouth 2 (two) times daily.    CALCIUM POLYCARBOPHIL (FIBERCON PO)    Take 1 tablet by mouth 2 (two) times daily.    CHOLECALCIFEROL (VITAMIN D) 400 UNITS TABS    Take 400 Units by mouth every morning.    CRANBERRY EXTRACT PO    Take 1 tablet by mouth every morning.    FISH OIL-OMEGA-3 FATTY ACIDS 1000 MG CAPSULE    Take 1 g by mouth every morning.    GLUCOSAMINE-CHONDROITIN (GLUCOSAMINE CHONDR COMPLEX PO)    Take 1 tablet by mouth 2 (two) times daily. for joints   IBUPROFEN (ADVIL,MOTRIN) 200 MG TABLET    Take 200 mg by mouth. Take 2 tablets as needed for inflammatory pain   MECLIZINE (ANTIVERT) 25 MG TABLET    Take 1 tablet (25 mg total) by mouth 3 (three) times daily as needed for dizziness.   MULTIPLE VITAMINS-MINERALS (PRESERVISION/LUTEIN) CAPS    Take 1 capsule by mouth 2 (two) times daily.   NYSTATIN (MYCOSTATIN/NYSTOP) POWDER    Apply to affected skin daily to treat yeast   PROBIOTIC PRODUCT (RESTORA) CAPS    Take 1 capsule by mouth every morning.    TRIMETHOPRIM (TRIMPEX) 100 MG TABLET    Take 100 mg by mouth at bedtime.   ZOLPIDEM (AMBIEN) 5 MG TABLET    Take one half tablet at bedtime if needed for rest  Modified Medications   No medications on file  Discontinued Medications   No medications on file     Review of Systems  Constitutional: Positive for activity change and appetite change. Negative for fatigue, fever and unexpected weight change.  HENT: Positive for hearing loss (bilateral earing aides). Negative for ear pain.   Eyes:       History of loss of visual acuity and macular degeneration.  Respiratory:       History of breast discomfort. History of removal of a lump of cancer from the right breast. Dyspnea on exertion.  Cardiovascular: Positive for leg swelling. Negative for chest pain and palpitations.  Gastrointestinal:       She frequently complains that her intestines are not working right. There is a previous history of diverticulosis. Her last colonoscopy was in 2008. She has been seen by Dr. Paulita Fujita  in the past. Constipation - she associates with Tramadol  Genitourinary: Positive for decreased urine volume, difficulty urinating and frequency. Negative for dysuria, vaginal bleeding and vaginal discharge.       Complains of a slow, weak urinary stream. Denies dysuria. Has increased frequency and nocturia. Saw Dr. Louis Meckel, urologist. She was taught self-catheterization for a bladder that does not empty well. Vaginal discomfort. Managed by Dr. Claudean Kinds. History of elevated CEA 125. Normal ultrasound of the abdomen and genitourinary tract.  Musculoskeletal:       Chronic back pain.  Patient has a shorter right leg and has a lift in the right shoe. Now seeing Dr. Rhona Raider.  Right hip arthroplasty 5/16 There is generalized stiffness in the joints. There is a history of spinal stenosis. Left third finger catches on extension sometimes. There is a  trigger finger. Pain at the left iliac crest and left anterior ribs near the sternum.  Skin:       History of skin discoloration and changes of nails.  Rash on the left scapula, right forearm, and left forearm. [place] in late Oct 2017 and contused the left side of the back.  Allergic/Immunologic: Negative.   Neurological: Positive for dizziness. Negative for tremors, seizures and numbness.       History of spinal stenosis with neurogenic claudication affecting the right leg. Complains of pain in the buttocks that is suggestive of neuralgia from her spinal stenosis. Hospitalized for 24 hours 08/24/2015 for vertigo.  Hematological: Bruises/bleeds easily.  Psychiatric/Behavioral: Negative.     Vitals:   12/02/15 1124  BP: 114/74  Pulse: 62  Temp: 97.8 F (36.6 C)  TempSrc: Oral  SpO2: 96%  Weight: 139 lb (63 kg)  Height: '5\' 1"'$  (1.549 m)   Wt Readings from Last 3 Encounters:  12/02/15 139 lb (63 kg)  11/21/15 146 lb (66.2 kg)  11/12/15 141 lb (64 kg)    Body mass index is 26.26 kg/m.  Physical Exam  Constitutional: She is oriented to person, place, and time. She appears well-developed and well-nourished. No distress.  HENT:  HOH Nonocclusive wax in both EAC.  Eyes:  Corrective lenses.  Neck: No JVD present. No tracheal deviation present. No thyromegaly present.  Cardiovascular: Normal rate, regular rhythm and normal heart sounds.  Exam reveals no gallop and no friction rub.   No murmur heard. Pulses in RLE not palpable, has 2+ edema which could be obscuring pulses  Pulmonary/Chest: Effort normal and breath sounds normal. She has no rales.  Abdominal: Soft. Bowel sounds are normal. She exhibits no distension and no mass. There is no tenderness.  Musculoskeletal: Normal range of motion. She exhibits edema (RLE). She exhibits no tenderness.  Poor balance; using walker with PT following right hip replacement  Lymphadenopathy:    She has no cervical adenopathy.  Neurological:  She is alert and oriented to person, place, and time. No cranial nerve deficit.  Skin: Skin is warm and dry. There is pallor.  Ovoid reddish patch at the left scapula. I am unable to find any blisters on this patch. Right forearm has a larger firm lump which has a reddish covering. Nontender. Left forearm has a reddish patch, but no lump of tissue under the patch. Left side of the back has a resolving hand sized bruise sustained in a contusion after a fall.  Psychiatric: She has a normal mood and affect. Her behavior is normal. Thought content normal.     Labs reviewed: Lab Summary Latest Ref Rng & Units  10/22/2015 09/22/2015 08/25/2015 08/24/2015  Hemoglobin 12.0 - 15.0 g/dL 12.3 13.7 (None) 12.4  Hematocrit 36.0 - 46.0 % 36.9 41 (None) 36.9  White count 4.0 - 10.5 K/uL 7.7 6.4 (None) 7.8  Platelet count 150 - 400 K/uL 252 277 (None) 235  Sodium 135 - 145 mmol/L 133(L) 136(A) 135 129(L)  Potassium 3.5 - 5.1 mmol/L 3.9 4.7 3.9 4.0  Calcium 8.9 - 10.3 mg/dL 9.1 (None) 8.9 9.3  Phosphorus - (None) (None) (None) (None)  Creatinine 0.44 - 1.00 mg/dL 0.67 1.0 0.57 0.71  AST 13 - 35 U/L (None) 19 (None) 24  Alk Phos 25 - 125 U/L (None) 165(A) (None) 119  Bilirubin 0.3 - 1.2 mg/dL (None) (None) (None) 0.5  Glucose 65 - 99 mg/dL 109(H) (None) 109(H) 132(H)  Cholesterol - (None) (None) (None) (None)  HDL cholesterol - (None) (None) (None) (None)  Triglycerides - (None) (None) (None) (None)  LDL Direct - (None) (None) (None) (None)  LDL Calc - (None) (None) (None) (None)  Total protein 6.5 - 8.1 g/dL (None) (None) (None) 6.9  Albumin 3.5 - 5.0 g/dL (None) (None) (None) 4.1  Some recent data might be hidden   Lab Results  Component Value Date   TSH 3.215 08/25/2015   Lab Results  Component Value Date   BUN 10 10/22/2015   BUN 15 09/22/2015   BUN 10 08/25/2015   Lab Results  Component Value Date   CREATININE 0.67 10/22/2015   CREATININE 1.0 09/22/2015   CREATININE 0.57 08/25/2015    Lab Results  Component Value Date   HGBA1C 5.9 01/23/2015       Assessment/Plan  1. Abnormal PET scan of mediastinum Scheduled for biopsy of adrenal tomorrow and a follow up visit with Dr. Jana Hakim 12/12/15..  2. Contusion of lower back, subsequent encounter resolveing  3. Urinary tract infection without hematuria, site unspecified She is to see Dr. Matthew Saras, her GYN in [place]. I recommended that she talk with him about vaginal estrogens to reduce vaginal tissue irritation and to see if they would help reduce the number of UTI.

## 2015-12-03 ENCOUNTER — Ambulatory Visit (HOSPITAL_COMMUNITY)
Admission: RE | Admit: 2015-12-03 | Discharge: 2015-12-03 | Disposition: A | Discharge status: Home / Self Care | Payer: Medicare Other | Source: Ambulatory Visit | Attending: Oncology | Admitting: Oncology

## 2015-12-03 ENCOUNTER — Encounter (HOSPITAL_COMMUNITY): Payer: Self-pay

## 2015-12-03 DIAGNOSIS — Z9841 Cataract extraction status, right eye: Secondary | ICD-10-CM | POA: Diagnosis not present

## 2015-12-03 DIAGNOSIS — Z7982 Long term (current) use of aspirin: Secondary | ICD-10-CM | POA: Insufficient documentation

## 2015-12-03 DIAGNOSIS — C50911 Malignant neoplasm of unspecified site of right female breast: Secondary | ICD-10-CM | POA: Insufficient documentation

## 2015-12-03 DIAGNOSIS — G4700 Insomnia, unspecified: Secondary | ICD-10-CM | POA: Insufficient documentation

## 2015-12-03 DIAGNOSIS — Z170 Estrogen receptor positive status [ER+]: Secondary | ICD-10-CM

## 2015-12-03 DIAGNOSIS — Z96641 Presence of right artificial hip joint: Secondary | ICD-10-CM | POA: Insufficient documentation

## 2015-12-03 DIAGNOSIS — I4510 Unspecified right bundle-branch block: Secondary | ICD-10-CM | POA: Insufficient documentation

## 2015-12-03 DIAGNOSIS — Z8249 Family history of ischemic heart disease and other diseases of the circulatory system: Secondary | ICD-10-CM | POA: Insufficient documentation

## 2015-12-03 DIAGNOSIS — M810 Age-related osteoporosis without current pathological fracture: Secondary | ICD-10-CM | POA: Diagnosis not present

## 2015-12-03 DIAGNOSIS — E876 Hypokalemia: Secondary | ICD-10-CM | POA: Diagnosis not present

## 2015-12-03 DIAGNOSIS — K5730 Diverticulosis of large intestine without perforation or abscess without bleeding: Secondary | ICD-10-CM | POA: Diagnosis not present

## 2015-12-03 DIAGNOSIS — Z836 Family history of other diseases of the respiratory system: Secondary | ICD-10-CM | POA: Diagnosis not present

## 2015-12-03 DIAGNOSIS — F329 Major depressive disorder, single episode, unspecified: Secondary | ICD-10-CM | POA: Diagnosis not present

## 2015-12-03 DIAGNOSIS — Z853 Personal history of malignant neoplasm of breast: Secondary | ICD-10-CM | POA: Diagnosis not present

## 2015-12-03 DIAGNOSIS — Z9842 Cataract extraction status, left eye: Secondary | ICD-10-CM | POA: Diagnosis not present

## 2015-12-03 DIAGNOSIS — Z923 Personal history of irradiation: Secondary | ICD-10-CM | POA: Diagnosis not present

## 2015-12-03 DIAGNOSIS — Z809 Family history of malignant neoplasm, unspecified: Secondary | ICD-10-CM | POA: Insufficient documentation

## 2015-12-03 DIAGNOSIS — C7972 Secondary malignant neoplasm of left adrenal gland: Secondary | ICD-10-CM | POA: Diagnosis not present

## 2015-12-03 DIAGNOSIS — C801 Malignant (primary) neoplasm, unspecified: Secondary | ICD-10-CM | POA: Diagnosis not present

## 2015-12-03 DIAGNOSIS — E279 Disorder of adrenal gland, unspecified: Secondary | ICD-10-CM | POA: Insufficient documentation

## 2015-12-03 DIAGNOSIS — L309 Dermatitis, unspecified: Secondary | ICD-10-CM | POA: Diagnosis not present

## 2015-12-03 DIAGNOSIS — H3530 Unspecified macular degeneration: Secondary | ICD-10-CM | POA: Insufficient documentation

## 2015-12-03 DIAGNOSIS — C7970 Secondary malignant neoplasm of unspecified adrenal gland: Secondary | ICD-10-CM | POA: Diagnosis not present

## 2015-12-03 DIAGNOSIS — I959 Hypotension, unspecified: Secondary | ICD-10-CM | POA: Insufficient documentation

## 2015-12-03 DIAGNOSIS — M48062 Spinal stenosis, lumbar region with neurogenic claudication: Secondary | ICD-10-CM | POA: Diagnosis not present

## 2015-12-03 LAB — PROTIME-INR
INR: 0.91
Prothrombin Time: 12.3 seconds (ref 11.4–15.2)

## 2015-12-03 LAB — CBC WITH DIFFERENTIAL/PLATELET
BASOS ABS: 0 10*3/uL (ref 0.0–0.1)
Basophils Relative: 0 %
Eosinophils Absolute: 0.2 10*3/uL (ref 0.0–0.7)
Eosinophils Relative: 3 %
HEMATOCRIT: 36 % (ref 36.0–46.0)
HEMOGLOBIN: 12.2 g/dL (ref 12.0–15.0)
LYMPHS PCT: 22 %
Lymphs Abs: 1.8 10*3/uL (ref 0.7–4.0)
MCH: 31 pg (ref 26.0–34.0)
MCHC: 33.9 g/dL (ref 30.0–36.0)
MCV: 91.6 fL (ref 78.0–100.0)
Monocytes Absolute: 0.8 10*3/uL (ref 0.1–1.0)
Monocytes Relative: 10 %
NEUTROS ABS: 5.2 10*3/uL (ref 1.7–7.7)
Neutrophils Relative %: 65 %
Platelets: 232 10*3/uL (ref 150–400)
RBC: 3.93 MIL/uL (ref 3.87–5.11)
RDW: 14.1 % (ref 11.5–15.5)
WBC: 8 10*3/uL (ref 4.0–10.5)

## 2015-12-03 LAB — BASIC METABOLIC PANEL
ANION GAP: 8 (ref 5–15)
BUN: 19 mg/dL (ref 6–20)
CHLORIDE: 101 mmol/L (ref 101–111)
CO2: 24 mmol/L (ref 22–32)
Calcium: 9.3 mg/dL (ref 8.9–10.3)
Creatinine, Ser: 0.8 mg/dL (ref 0.44–1.00)
GFR calc Af Amer: >60 mL/min (ref >60)
Glucose, Bld: 118 mg/dL — ABNORMAL HIGH (ref 65–99)
POTASSIUM: 4.2 mmol/L (ref 3.5–5.1)
SODIUM: 133 mmol/L — AB (ref 135–145)

## 2015-12-03 LAB — CA 125: Cancer Antigen (CA) 125: 55.3 U/mL — ABNORMAL HIGH (ref 0.0–38.1)

## 2015-12-03 LAB — CANCER ANTIGEN 19-9: CAN 19-9: 30 U/mL (ref 0–35)

## 2015-12-03 MED ORDER — FENTANYL CITRATE (PF) 100 MCG/2ML IJ SOLN
INTRAMUSCULAR | Status: AC | PRN
  Administered 2015-12-03: 25 ug via INTRAVENOUS

## 2015-12-03 MED ORDER — MIDAZOLAM HCL 2 MG/2ML IJ SOLN
INTRAMUSCULAR | Status: AC | PRN
  Administered 2015-12-03: 1 mg via INTRAVENOUS

## 2015-12-03 MED ORDER — SODIUM CHLORIDE 0.9 % IV SOLN
INTRAVENOUS | Status: DC
  Administered 2015-12-03: 08:00:00 via INTRAVENOUS

## 2015-12-03 MED ORDER — MIDAZOLAM HCL 2 MG/2ML IJ SOLN
INTRAMUSCULAR | Status: AC
Start: 2015-12-03 — End: 2015-12-03
  Filled 2015-12-03: qty 4

## 2015-12-03 MED ORDER — FENTANYL CITRATE (PF) 100 MCG/2ML IJ SOLN
INTRAMUSCULAR | Status: AC
  Filled 2015-12-03: qty 4

## 2015-12-03 NOTE — Discharge Instructions (Signed)
Biopsia por puncin, cuidados posteriores (Needle Biopsy, Care After) Estas indicaciones le proporcionan informacin acerca de cmo deber cuidarse despus del procedimiento. El mdico tambin podr darle instrucciones especficas. Comunquese con el mdico si tiene algn problema o tiene preguntas despus del procedimiento. CUIDADOS EN EL HOGAR  Haga reposo tal como le indic el mdico.  Tome los medicamentos solamente como se lo haya indicado el mdico.  Hay muchas maneras distintas de cerrar y Regulatory affairs officer de la biopsia, entre ellas, puntos (suturas), pegamento cutneo y Sonny Masters. Siga las indicaciones del mdico respecto de lo siguiente:  Cmo Government social research officer de la biopsia.  Cmo y cundo cambiar las vendas (vendaje).  Cundo retirar el vendaje.  Quitar lo que sea que se haya usado para Energy manager de la biopsia.  Psychiatric nurse de la biopsia todos los das para descartar signos de infeccin. Est atento a lo siguiente:  Dolor, hinchazn o enrojecimiento.  Lquido, sangre o pus. SOLICITE AYUDA SI:  Tiene fiebre.  Tiene enrojecimiento, hinchazn o Management consultant de la biopsia, y estos sntomas duran ms de [employer].  Tiene lquido, sangre o pus que emanan del lugar de la biopsia.  Siente malestar estomacal (nuseas).  Tiene vmitos. SOLICITE AYUDA DE INMEDIATO SI:  Comienza a sentir falta de aire.  Tiene dificultad para respirar.  Le duele el pecho.  Se siente mareado o se desvanece (se desmaya).  Tiene sangrado que no se detiene con la presin o un vendaje.  Tose y escupe sangre.  Le duele el abdomen. Esta informacin no tiene Marine scientist el consejo del mdico. Asegrese de hacerle al mdico cualquier pregunta que tenga. Document Released: 03/26/2008 Document Revised: 05/14/2014 Document Reviewed: 12/24/2013 Elsevier Interactive Patient Education  2017 [place]. Moderate Conscious Sedation, Adult, Care After These  instructions provide you with information about caring for yourself after your procedure. Your health care provider may also give you more specific instructions. Your treatment has been planned according to current medical practices, but problems sometimes occur. Call your health care provider if you have any problems or questions after your procedure. What can I expect after the procedure? After your procedure, it is common:  To feel sleepy for several hours.  To feel clumsy and have poor balance for several hours.  To have poor judgment for several hours.  To vomit if you eat too soon. Follow these instructions at home: For at least 24 hours after the procedure:   Do not:  Participate in activities where you could fall or become injured.  Drive.  Use heavy machinery.  Drink alcohol.  Take sleeping pills or medicines that cause drowsiness.  Make important decisions or sign legal documents.  Take care of children on your own.  Rest. Eating and drinking  Follow the diet recommended by your health care provider.  If you vomit:  Drink water, juice, or soup when you can drink without vomiting.  Make sure you have little or no nausea before eating solid foods. General instructions  Have a responsible adult stay with you until you are awake and alert.  Take over-the-counter and prescription medicines only as told by your health care provider.  If you smoke, do not smoke without supervision.  Keep all follow-up visits as told by your health care provider. This is important. Contact a health care provider if:  You keep feeling nauseous or you keep vomiting.  You feel light-headed.  You develop a rash.  You have a  fever. Get help right away if:  You have trouble breathing. This information is not intended to replace advice given to you by your health care provider. Make sure you discuss any questions you have with your health care provider. Document Released:  10/18/2012 Document Revised: 06/02/2015 Document Reviewed: 04/19/2015 Elsevier Interactive Patient Education  2017 [employer].

## 2015-12-03 NOTE — Consult Note (Signed)
Chief Complaint: Patient was seen in consultation today for left adrenal mass biopsy  Referring Physician(s): Chauncey Cruel  Supervising Physician: Marybelle Killings  Patient Status: [facility] - Out-pt  History of Present Illness: Cassandra Glenn is a 82 y.o. female with pertinent medical history of osteoporosis, spinal stenosis, right-sided invasive breast cancer (2000) s/p radiation and tamoxifen, non-invasive left-sided breast cancer surgically removed with newly found adrenal mass identified on PET scan.   PET (10/27) IMPRESSION: 1. Unfortunately the patient has a pattern of metabolic lesions which suggests metastatic disease. 2. Hypermetabolic RIGHT lower paratracheal lymph node is concerning for metastatic lesion. 3. Hypermetabolic thickening of the pleural surface along azygos esophageal recess. 4. Hypermetabolic adrenal glands are concerning for metastatic lesion. The LEFT adrenal gland may be amenable to biopsy. 5. Single hypermetabolic small (30mm) lymph node in the RIGHT pelvis. Findings concerning for a nodal metastasis although again unusual pattern. These results will be called to the ordering clinician or representative by the Radiologist Assistant, and communication documented in the PACS or zVision Dashboard.  Biopsy requested by Dr. Jana Hakim.  Imagining reviewed.  Patient is not on blood-thinners and has been NPO.   Past Medical History:  Diagnosis Date  . Abnormalities of the hair 02/29/2012  . Arthritis   . Cancer ([place])   . Candidiasis of skin and nails 03/19/2011  . Closed fracture of base of neck of femur ([place]) 03/18/2011  . Contact dermatitis and other eczema, due to unspecified cause 05/07/2011  . Depression 07/23/2014  . Diverticulosis of colon (without mention of hemorrhage) 03/19/2011  . Edema 05/07/2011  . HOH (hard of hearing)   . Hypopotassemia 03/26/2011  . Hyposmolality and/or hyponatremia 03/19/2011  . Hypotension, unspecified 03/18/2011    . Impaired fasting glucose 03/19/2011  . Insomnia, unspecified 03/19/2011  . Kidney infection   . Lumbago 03/19/2011  . Macular degeneration   . Malignant neoplasm of breast (female), unspecified site 10/28/2002  . Osteoporosis, unspecified 03/19/2011  . Other malaise and fatigue 03/19/2011  . Pain in joint, pelvic region and thigh 03/19/2011  . Right bundle branch block 08/30/2011  . Shortness of breath   . Spasm of muscle 03/19/2011  . Spinal stenosis, lumbar region, with neurogenic claudication 11/02/2011  . Trigger finger (acquired) 11/02/2011  . Unspecified constipation 03/19/2011  . Vaginitis and vulvovaginitis 02/29/2012  . Vertigo 08/26/2015    Past Surgical History:  Procedure Laterality Date  . BREAST SURGERY Bilateral 2005-10/27/2004   lumpectomy- Streck,MD  . CATARACT EXTRACTION  2010   bialteral  . EYE SURGERY     cataract  . FRACTURE SURGERY Left 09/1980   ankle  . HARDWARE REMOVAL Right 05/28/2014   Procedure: HARDWARE REMOVAL;  Surgeon: Melrose Nakayama, MD;  Location: [place];  Service: Orthopedics;  Laterality: Right;  . HIP PINNING,CANNULATED  03/08/2011   Procedure: CANNULATED HIP PINNING;  Surgeon: Johnn Hai, MD;  Location: [facility];  Service: Orthopedics;  Laterality: Right;  . ORIF HIP FRACTURE Right 03/08/2011   Bean,MD  . SKIN CANCER EXCISION Left 10/12/2012   lower leg Dr. [place]  . SKIN LESION EXCISION Right 02/04/2011   Abdomen lesion spongiotic dermatitis-Taffeen, MD  . SQUAMOUS CELL CARCINOMA EXCISION Right 02/04/2011   forearmSyble Creek, MD  . TONSILLECTOMY    . TOTAL HIP ARTHROPLASTY Right 05/28/2014   Procedure: TOTAL HIP ARTHROPLASTY ANTERIOR APPROACH;  Surgeon: Melrose Nakayama, MD;  Location: [place];  Service: Orthopedics;  Laterality: Right;    Allergies: Patient has no known allergies.  Medications: Prior to Admission medications   Medication Sig Start Date End Date Taking? Authorizing Provider  acetaminophen (TYLENOL) 500 MG tablet  Take 1,000 mg by mouth every 6 (six) hours as needed for moderate pain.    Yes Historical Provider, MD  aspirin 81 MG chewable tablet Chew 81 mg by mouth every morning.    Yes Historical Provider, MD  Bilberry 1000 MG CAPS Take 1,000 mg by mouth every morning.    Yes Historical Provider, MD  Calcium Carbonate-Vitamin D (CALCIUM 600/VITAMIN D PO) Take 600 mg by mouth 2 (two) times daily.    Yes Historical Provider, MD  Calcium Polycarbophil (FIBERCON PO) Take 1 tablet by mouth 2 (two) times daily.    Yes Historical Provider, MD  cholecalciferol (VITAMIN D) 400 UNITS TABS Take 400 Units by mouth every morning.    Yes Historical Provider, MD  CRANBERRY EXTRACT PO Take 1 tablet by mouth every morning.    Yes Historical Provider, MD  fish oil-omega-3 fatty acids 1000 MG capsule Take 1 g by mouth every morning.    Yes Historical Provider, MD  Glucosamine-Chondroitin (GLUCOSAMINE CHONDR COMPLEX PO) Take 1 tablet by mouth 2 (two) times daily. for joints   Yes Historical Provider, MD  ibuprofen (ADVIL,MOTRIN) 200 MG tablet Take 200 mg by mouth. Take 2 tablets as needed for inflammatory pain   Yes Historical Provider, MD  Multiple Vitamins-Minerals (PRESERVISION/LUTEIN) CAPS Take 1 capsule by mouth 2 (two) times daily.   Yes Historical Provider, MD  Probiotic Product (RESTORA) CAPS Take 1 capsule by mouth every morning.    Yes Historical Provider, MD  trimethoprim (TRIMPEX) 100 MG tablet Take 100 mg by mouth at bedtime. 10/02/15  Yes Historical Provider, MD  zolpidem (AMBIEN) 5 MG tablet Take one half tablet at bedtime if needed for rest 07/29/15  Yes Estill Dooms, MD  meclizine (ANTIVERT) 25 MG tablet Take 1 tablet (25 mg total) by mouth 3 (three) times daily as needed for dizziness. 08/25/15   Thurnell Lose, MD  nystatin (MYCOSTATIN/NYSTOP) powder Apply to affected skin daily to treat yeast 10/29/15   Estill Dooms, MD     Family History  Problem Relation Age of Onset  . Heart disease Mother   .  Cancer Father   . Emphysema Brother   . Alzheimer's disease Brother     Social History   Social History  . Marital status: Widowed    Spouse name: N/A  . Number of children: N/A  . Years of education: N/A   Occupational History  . homemaker    Social History Main Topics  . Smoking status: Never Smoker  . Smokeless tobacco: Never Used  . Alcohol use 0.0 oz/week     Comment: occasional glass  . Drug use: No  . Sexual activity: No   Other Topics Concern  . None   Social History Narrative   Lives at [facility]   Widowed   Environmental consultant   Exercise none   POA/Living Will         Review of Systems  Constitutional: Negative for activity change, chills and fever.  Respiratory: Negative for cough and shortness of breath.   Cardiovascular: Negative for chest pain and leg swelling.  Gastrointestinal: Negative for abdominal pain.  Musculoskeletal: Positive for back pain (chronic).  Psychiatric/Behavioral: Negative for behavioral problems and confusion.    Vital Signs: BP (!) 182/67 (BP Location: Left Arm) Comment: RN notified  Pulse 80   Temp 97.8 F (36.6  C) (Oral)   Resp 16   Ht 5\' 1"  (1.549 m)   Wt 139 lb (63 kg)   SpO2 97%   BMI 26.26 kg/m   Physical Exam  Constitutional: She is oriented to person, place, and time. She appears well-developed.  HENT:  Head: Normocephalic and atraumatic.  Cardiovascular: Normal rate, regular rhythm and normal heart sounds.   Pulmonary/Chest: Effort normal and breath sounds normal. No respiratory distress.  Abdominal: There is no tenderness.  Musculoskeletal: Normal range of motion.  Neurological: She is alert and oriented to person, place, and time.  Skin: Skin is warm and dry.  Psychiatric: She has a normal mood and affect. Her behavior is normal. Judgment and thought content normal.  Nursing note and vitals reviewed.   Mallampati Score:  MD Evaluation Airway: WNL Heart: WNL Abdomen: WNL Chest/ Lungs: WNL ASA   Classification: 3 Mallampati/Airway Score: One  Imaging: Nm Pet Image Initial (pi) Skull Base To Thigh  Result Date: 11/07/2015 CLINICAL DATA:  Initial treatment strategy for lung nodule. EXAM: NUCLEAR MEDICINE PET SKULL BASE TO THIGH TECHNIQUE: 6.9 mCi F-18 FDG was injected intravenously. Full-ring PET imaging was performed from the skull base to thigh after the radiotracer. CT data was obtained and used for attenuation correction and anatomic localization. FASTING BLOOD GLUCOSE:  Value: 129 mg/dl COMPARISON:  CT abdomen 10/28/2015, CT chest 10/27/2015 FINDINGS: NECK No hypermetabolic lymph nodes in the neck. CHEST Switch hypermetabolic RIGHT paratracheal lymph node 2 is minimally enlarged to Send 10 12 mm with intense metabolic activity (SUV max equal 8.5). No additional hypermetabolic mediastinal adenopathy. There is a in rim of metabolic activity along the pleural surface of the azygos esophageal region status with SUV max equal 1 S none 20 9 (image 62 of fused data set. Mild metabolic activity associated with scarring within the RIGHT upper lobe and RIGHT middle lobe. There is metabolic activity associated with healing fractures and subacute fractures of the LEFT lower ribs. ABDOMEN/PELVIS To intense metabolic activity associated the thickened LEFT adrenal gland with SUV max equal 10.8. Hypermetabolic activity associated the RIGHT adrenal gland SUV max equal 8 point 4. No abnormal metabolic activity liver. No hypermetabolic upper abdominal lymph nodes. There is single hypermetabolic pelvic lymph nodes measuring 7 mm on image 149, series 4 with SUV max equal 5.6. SKELETON No focal hypermetabolic activity to suggest skeletal metastasis. IMPRESSION: 1. Unfortunately the patient has a pattern of metabolic lesions which suggests metastatic disease. 2. Hypermetabolic RIGHT lower paratracheal lymph node is concerning for metastatic lesion. 3. Hypermetabolic thickening of the pleural surface along azygos  esophageal recess. 4. Hypermetabolic adrenal glands are concerning for metastatic lesion. The LEFT adrenal gland may be amenable to biopsy. 5. Single hypermetabolic small (33mm) lymph node in the RIGHT pelvis. Findings concerning for a nodal metastasis although again unusual pattern. These results will be called to the ordering clinician or representative by the Radiologist Assistant, and communication documented in the PACS or zVision Dashboard. Electronically Signed   By: Suzy Bouchard M.D.   On: 11/07/2015 11:41    Labs:  CBC:  Recent Labs  09/22/15 10/22/15 1320 12/02/15 1300 12/03/15 0716  WBC 6.4 7.7 6.7 8.0  HGB 13.7 12.3 12.2 12.2  HCT 41 36.9 37.0 36.0  PLT 277 252 216 232    COAGS:  Recent Labs  12/02/15 1300 12/03/15 0716  INR 0.90* 0.91    BMP:  Recent Labs  08/24/15 1109 08/25/15 0439 09/22/15 10/22/15 1320 12/02/15 1301 12/03/15 0716  NA  129* 135 136* 133* 134* 133*  K 4.0 3.9 4.7 3.9 4.3 4.2  CL 96* 103  --  96*  --  101  CO2 25 22  --  26 25 24   GLUCOSE 132* 109*  --  109* 102 118*  BUN 11 10 15 10  14.7 19  CALCIUM 9.3 8.9  --  9.1 10.2 9.3  CREATININE 0.71 0.57 1.0 0.67 0.8 0.80  GFRNONAA >60 >60  --  >60  --  >60  GFRAA >60 >60  --  >60  --  >60    LIVER FUNCTION TESTS:  Recent Labs  02/24/15 0108 07/03/15 1203 07/24/15 08/24/15 1109 09/22/15 12/02/15 1301  BILITOT 0.6 0.5  --  0.5  --  0.42  AST 20 19 17 24 19 21   ALT 12* 11 10 13* 11 18  ALKPHOS 98 119 120 119 165* 148  PROT 6.5 6.8  --  6.9  --  7.0  ALBUMIN 3.7 4.3  --  4.1  --  3.7    TUMOR MARKERS: No results for input(s): AFPTM, CEA, CA199, CHROMGRNA in the last 8760 hours.  Assessment and Plan:  Adrenal mass biopsy Cassandra Glenn is a 82 y.o. female with pertinent medical history of osteoporosis, spinal stenosis, right invasive breast cancer (2000) s/p radiation and tamoxifen, non-invasive left breast cancer surgically removed with newly found adrenal mass identified  on PET scan.  Scheduled today for left adrenal mass biopsy. Risks and benefits discussed with the patient and daughter including, but not limited to bleeding, infection, damage to adjacent structures or low yield requiring additional tests. All of the patient's questions were answered, patient is agreeable to proceed. Consent signed and in chart.    Thank you for this interesting consult.  I greatly enjoyed meeting Cassandra Glenn and look forward to participating in their care.  A copy of this report was sent to the requesting provider on this date.  Electronically Signed: Docia Barrier 12/03/2015, 8:46 AM   I spent a total of  15 Minutes in face to face in clinical consultation, greater than 50% of which was counseling/coordinating care for left adrenal mass biopsy.

## 2015-12-03 NOTE — Procedures (Signed)
L adrenal Bx 18 g core times one No comp/EBL

## 2015-12-03 NOTE — Sedation Documentation (Signed)
Patient is resting comfortably. No complaints at this time 

## 2015-12-03 NOTE — Sedation Documentation (Signed)
Biopsy taken.

## 2015-12-03 NOTE — Sedation Documentation (Signed)
Vital signs stable. 

## 2015-12-12 ENCOUNTER — Ambulatory Visit (HOSPITAL_BASED_OUTPATIENT_CLINIC_OR_DEPARTMENT_OTHER): Payer: Self-pay | Admitting: Oncology

## 2015-12-12 VITALS — BP 160/61 | HR 83 | Temp 98.0°F | Resp 18 | Ht 61.0 in | Wt 140.7 lb

## 2015-12-12 DIAGNOSIS — C801 Malignant (primary) neoplasm, unspecified: Secondary | ICD-10-CM

## 2015-12-12 DIAGNOSIS — Z23 Encounter for immunization: Secondary | ICD-10-CM | POA: Diagnosis not present

## 2015-12-12 DIAGNOSIS — C7972 Secondary malignant neoplasm of left adrenal gland: Secondary | ICD-10-CM

## 2015-12-12 NOTE — Progress Notes (Signed)
The drugs is already [facility]  [phone] [phone]     ID: Cassandra Glenn DOB: 1920-02-23  MR#: [id number]  [id number]  Patient Care Team: Estill Dooms, MD as PCP - General (Internal Medicine) [place] Otho Darner, NP as Nurse Practitioner (Nurse Practitioner) Shon Hough, MD as Consulting Physician (Ophthalmology) Molli Posey, MD as Consulting Physician (Obstetrics and Gynecology) Gerarda Fraction, MD as Consulting Physician (Ophthalmology) Lorelle Gibbs, MD as Consulting Physician (Radiology) Melrose Nakayama, MD as Consulting Physician (Orthopedic Surgery) Gerarda Fraction, MD as Referring Physician (Ophthalmology) Ardis Hughs, MD as Attending Physician (Urology) Chauncey Cruel, MD as Consulting Physician (Oncology) Chauncey Cruel, MD OTHER MD:  CHIEF COMPLAINT: 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 At the last visit we obtain some tumor markers including a CEA and CA 19.9, both of which were normal. However the CA 125 was mildly elevated at 55.3. A CA 27-29 has been ordered but is not available  On 12/03/2015 she underwent CT-guided biopsy of the left adrenal lesion. This showed [id number]) metastatic carcinoma. Additional studies are still pending.  REVIEW OF SYSTEMS: Cassandra Glenn tolerated the biopsy well. She had some soreness at the biopsy site for a few days but that has resolved. She has some knee and joint pain which is no change from prior. She walks quite a long distance to the dining room every day and in general her review of systems today is stable  BREAST CANCER HISTORY: From the original intake note:  I saw Cassandra Glenn approximately approximately 13 years ago when she was diagnosed with right-sided invasive breast cancer, which was treated with radiation and tamoxifen for  5 years. She also had a noninvasive breast cancer removed from the left breast at the same time.  More recently Cassandra Glenn had a chest x-ray 10/22/2015 for evaluation of a cough. The film showed a variety of incidental findings including atherosclerosis of the thoracic aorta and an old lower thoracic compression fracture. There was also however a right apical density which required further evaluation. Accordingly on 10/27/2015 she had a chest CT scan which found chronic areas of scarring in both lungs, not significantly changed from November 2015. However right middle lobe pleural thickening appeared worse, and there was increased nodular pleural thickening along the azygo-esophageal recess, suggestive of an indolent mesothelioma. There was a small cluster of nodules at the medial right lung base which was new from the prior study. There were no bony metastases but there were multiple chronic compression deformities and degenerative change.   Given these results, a PET scan was obtained 11/07/2015. There was a hypermetabolic right paratracheal lymph node measuring 1.2 cm, but no additional mediastinal adenopathy. Along the pleural surface of the azygo-esophageal region there was increased metabolic activity with an SUV max of 20.9. There was also intense metabolic activity associated with a thickened left adrenal gland, with an SUV max of 10.8. There was some hypermetabolic activity associated with the right adrenal gland. There were again no bony metastatic disease findings.  With this information the patient returns for further evaluation and treatment.   PAST MEDICAL HISTORY: Past Medical History:  Diagnosis Date  . Abnormalities of the hair 02/29/2012  . Arthritis   . Cancer ([place])   . Candidiasis of skin and nails 03/19/2011  . Closed fracture of base of neck of femur ([place]) 03/18/2011  . Contact dermatitis and  other eczema, due to unspecified cause 05/07/2011  . Depression 07/23/2014  .  Diverticulosis of colon (without mention of hemorrhage) 03/19/2011  . Edema 05/07/2011  . HOH (hard of hearing)   . Hypopotassemia 03/26/2011  . Hyposmolality and/or hyponatremia 03/19/2011  . Hypotension, unspecified 03/18/2011  . Impaired fasting glucose 03/19/2011  . Insomnia, unspecified 03/19/2011  . Kidney infection   . Lumbago 03/19/2011  . Macular degeneration   . Malignant neoplasm of breast (female), unspecified site 10/28/2002  . Osteoporosis, unspecified 03/19/2011  . Other malaise and fatigue 03/19/2011  . Pain in joint, pelvic region and thigh 03/19/2011  . Right bundle branch block 08/30/2011  . Shortness of breath   . Spasm of muscle 03/19/2011  . Spinal stenosis, lumbar region, with neurogenic claudication 11/02/2011  . Trigger finger (acquired) 11/02/2011  . Unspecified constipation 03/19/2011  . Vaginitis and vulvovaginitis 02/29/2012  . Vertigo 08/26/2015    PAST SURGICAL HISTORY: Past Surgical History:  Procedure Laterality Date  . BREAST SURGERY Bilateral 2005-10/27/2004   lumpectomy- Streck,MD  . CATARACT EXTRACTION  2010   bialteral  . EYE SURGERY     cataract  . FRACTURE SURGERY Left 09/1980   ankle  . HARDWARE REMOVAL Right 05/28/2014   Procedure: HARDWARE REMOVAL;  Surgeon: Melrose Nakayama, MD;  Location: [place];  Service: Orthopedics;  Laterality: Right;  . HIP PINNING,CANNULATED  03/08/2011   Procedure: CANNULATED HIP PINNING;  Surgeon: Johnn Hai, MD;  Location: [facility];  Service: Orthopedics;  Laterality: Right;  . ORIF HIP FRACTURE Right 03/08/2011   Bean,MD  . SKIN CANCER EXCISION Left 10/12/2012   lower leg Dr. [place]  . SKIN LESION EXCISION Right 02/04/2011   Abdomen lesion spongiotic dermatitis-Taffeen, MD  . SQUAMOUS CELL CARCINOMA EXCISION Right 02/04/2011   forearmSyble Creek, MD  . TONSILLECTOMY    . TOTAL HIP ARTHROPLASTY Right 05/28/2014   Procedure: TOTAL HIP ARTHROPLASTY ANTERIOR APPROACH;  Surgeon: Melrose Nakayama, MD;   Location: [place];  Service: Orthopedics;  Laterality: Right;    FAMILY HISTORY Family History  Problem Relation Age of Onset  . Heart disease Mother   . Cancer Father   . Emphysema Brother   . Alzheimer's disease Brother   The patient's father died at the age of 1 with colon cancer. The patient's mother died at the age of 48 from heart disease the patient had 2 brothers, no sisters. Both brothers have died, one from emphysema and one from dementia.  GYNECOLOGIC HISTORY:  No LMP recorded. Patient is postmenopausal. Menarche age 34, first live birth age 47, the patient is [place] P2. She stopped having periods around age 74. She never took hormone replacement.  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 She is widowed. She lives in independent living section of friend's home [place]. Her daughter Mardene Celeste lives in [place] (actually [place] where she teaches. Daughter Cassandra Glenn lives in [place] and is Web designer at [employer]. The patient has 5 grandchildren and 2 step grandchildren. She has one great grandchild and one on the way. She attends [employer]    ADVANCED DIRECTIVES: Arayla has named both her daughters as joint health care power of attorney. She tells me she has a living will "somewhere in the house" and has a DO NOT RESUSCITATE order written at friends homes.   HEALTH MAINTENANCE: Social History  Substance Use Topics  . Smoking status: Never Smoker  . Smokeless tobacco: Never  Used  . Alcohol use 0.0 oz/week     Comment: occasional glass     Colonoscopy:  PAP:  Bone density:   No Known Allergies  Current Outpatient Prescriptions  Medication Sig Dispense Refill  . acetaminophen (TYLENOL) 500 MG tablet Take 1,000 mg by mouth every 6 (six) hours as needed for moderate pain.     Marland Kitchen aspirin 81 MG chewable tablet Chew 81 mg by mouth every morning.     . Bilberry 1000 MG  CAPS Take 1,000 mg by mouth every morning.     . Calcium Carbonate-Vitamin D (CALCIUM 600/VITAMIN D PO) Take 600 mg by mouth 2 (two) times daily.     . Calcium Polycarbophil (FIBERCON PO) Take 1 tablet by mouth 2 (two) times daily.     . cholecalciferol (VITAMIN D) 400 UNITS TABS Take 400 Units by mouth every morning.     Marland Kitchen CRANBERRY EXTRACT PO Take 1 tablet by mouth every morning.     . fish oil-omega-3 fatty acids 1000 MG capsule Take 1 g by mouth every morning.     . Glucosamine-Chondroitin (GLUCOSAMINE CHONDR COMPLEX PO) Take 1 tablet by mouth 2 (two) times daily. for joints    . ibuprofen (ADVIL,MOTRIN) 200 MG tablet Take 200 mg by mouth. Take 2 tablets as needed for inflammatory pain    . meclizine (ANTIVERT) 25 MG tablet Take 1 tablet (25 mg total) by mouth 3 (three) times daily as needed for dizziness. 30 tablet 0  . Multiple Vitamins-Minerals (PRESERVISION/LUTEIN) CAPS Take 1 capsule by mouth 2 (two) times daily.    Marland Kitchen nystatin (MYCOSTATIN/NYSTOP) powder Apply to affected skin daily to treat yeast 45 g 4  . Probiotic Product (RESTORA) CAPS Take 1 capsule by mouth every morning.     . trimethoprim (TRIMPEX) 100 MG tablet Take 100 mg by mouth at bedtime.    Marland Kitchen zolpidem (AMBIEN) 5 MG tablet Take one half tablet at bedtime if needed for rest 15 tablet 5   No current facility-administered medications for this visit.     OBJECTIVE: Older white woman Who appears stated age 82:   12/12/15 1526  BP: (!) 160/61  Pulse: 83  Resp: 18  Temp: 98 F (36.7 C)     Body mass index is 26.59 kg/m.    ECOG FS:1 - Symptomatic but completely ambulatory  Exam was not repeated today   LAB RESULTS:  CMP     Component Value Date/Time   NA 133 (L) 12/03/2015 0716   NA 134 (L) 12/02/2015 1301   K 4.2 12/03/2015 0716   K 4.3 12/02/2015 1301   CL 101 12/03/2015 0716   CO2 24 12/03/2015 0716   CO2 25 12/02/2015 1301   GLUCOSE 118 (H) 12/03/2015 0716   GLUCOSE 102 12/02/2015 1301   BUN 19  12/03/2015 0716   BUN 14.7 12/02/2015 1301   CREATININE 0.80 12/03/2015 0716   CREATININE 0.8 12/02/2015 1301   CALCIUM 9.3 12/03/2015 0716   CALCIUM 10.2 12/02/2015 1301   PROT 7.0 12/02/2015 1301   ALBUMIN 3.7 12/02/2015 1301   AST 21 12/02/2015 1301   ALT 18 12/02/2015 1301   ALKPHOS 148 12/02/2015 1301   BILITOT 0.42 12/02/2015 1301   GFRNONAA >60 12/03/2015 0716   GFRNONAA 59 (L) 07/03/2015 1203   GFRAA >60 12/03/2015 0716   GFRAA 68 07/03/2015 1203    INo results found for: SPEP, UPEP  Lab Results  Component Value Date   WBC 8.0 12/03/2015   NEUTROABS  5.2 12-17-15   HGB 12.2 Dec 17, 2015   HCT 36.0 December 17, 2015   MCV 91.6 December 17, 2015   PLT 232 December 17, 2015      Chemistry      Component Value Date/Time   NA 133 (L) 12-17-15 0716   NA 134 (L) 12/02/2015 1301   K 4.2 12-17-15 0716   K 4.3 12/02/2015 1301   CL 101 2015-12-17 0716   CO2 24 2015/12/17 0716   CO2 25 12/02/2015 1301   BUN 19 12-17-2015 0716   BUN 14.7 12/02/2015 1301   CREATININE 0.80 12/17/15 0716   CREATININE 0.8 12/02/2015 1301   GLU 90 07/24/2015      Component Value Date/Time   CALCIUM 9.3 12/17/15 0716   CALCIUM 10.2 12/02/2015 1301   ALKPHOS 148 12/02/2015 1301   AST 21 12/02/2015 1301   ALT 18 12/02/2015 1301   BILITOT 0.42 12/02/2015 1301       Lab Results  Component Value Date   LABCA2 19 11/07/2007    No components found for: [id number]  No results for input(s): INR in the last 168 hours.  Urinalysis    Component Value Date/Time   COLORURINE YELLOW 11/11/2015 1420   APPEARANCEUR CLEAR 11/11/2015 1420   LABSPEC 1.010 11/11/2015 1420   PHURINE 6.5 11/11/2015 1420   GLUCOSEU NEGATIVE 11/11/2015 1420   HGBUR NEGATIVE 11/11/2015 1420   BILIRUBINUR NEGATIVE 11/11/2015 1420   BILIRUBINUR negative 07/03/2015 1208   BILIRUBINUR neg 08/23/2014 1539   KETONESUR NEGATIVE 11/11/2015 1420   PROTEINUR NEGATIVE 11/11/2015 1420   UROBILINOGEN 0.2 07/03/2015 1208   UROBILINOGEN  0.2 05/30/2014 1111   NITRITE NEGATIVE 11/11/2015 1420   LEUKOCYTESUR 2+ (A) 11/11/2015 1420     STUDIES: Ct Biopsy  Result Date: December 17, 2015 INDICATION: Left adrenal lesion EXAM: CT BIOPSY MEDICATIONS: None. ANESTHESIA/SEDATION: Fentanyl 25 mcg IV; Versed 1 mg IV Moderate Sedation Time:  12 The patient was continuously monitored during the procedure by the interventional radiology nurse under my direct supervision. FLUOROSCOPY TIME:  None COMPLICATIONS: None immediate. PROCEDURE: Informed written consent was obtained from the patient after a thorough discussion of the procedural risks, benefits and alternatives. All questions were addressed. Maximal Sterile Barrier Technique was utilized including caps, mask, sterile gowns, sterile gloves, sterile drape, hand hygiene and skin antiseptic. A timeout was performed prior to the initiation of the procedure. Under CT guidance, and in the left decubitus position, a(n) 17 gauge guide needle was advanced into the left adrenal mass. Subsequently 1 18 gauge core biopsy was obtained. The guide needle was removed. Post biopsy images demonstrate no hemorrhage. FINDINGS: Patient tolerated the procedure well without complication. Vital sign monitoring by nursing staff during the procedure will continue as patient is in the special procedures unit for post procedure observation. The images document guide needle placement within the left adrenal mass. Post biopsy images demonstrate no hemorrhage. IMPRESSION: Successful CT-guided left adrenal biopsy. Electronically Signed   By: Marybelle Killings M.D.   On: 12-17-2015 13:42    ELIGIBLE FOR AVAILABLE RESEARCH PROTOCOL: no  ASSESSMENT: 24 y.o. [place] woman residing at friends homes [place]  (1) status post right lumpectomy 2004 for an invasive breast cancer, treated with radiation and tamoxifen for 5 years  (a) Also status post left lumpectomy for ductal carcinoma in situ  (2) chest CT scan and PET scan obtained October  2017 show a hot 1.2 cm right paratracheal lymph node, a rim of activity along the pleural surface of the a a zygo-esophageal region, and bilateral adrenal  metastases, left greater than right   (a) biopsy of the left adrenal gland 12/03/2015 shows metastatic carcinoma, additional studies pending  PLAN: Unfortunately we do not have the additional studies results yet. We would like to know if this cancer is estrogen receptor positive as her breast cancer wise, and if not, whether it types out as a primary lung malignancy. If so we would once a Foundation one study and possibly a PDL one determination.  I contacted pathology today regarding these requests. I expect it will take between 1 and 2 weeks to get these back. Tentatively I have set Lili to return to see me early January, but of course I will contact her as soon as we have the appropriate results  I did not Bill for today's visit since we did not have the necessary information to make decisions.  She knows to call for any problems that may develop before her next visit here.  Chauncey Cruel, MD   12/13/2015 12:12 PM Medical Oncology and Hematology [facility] [address] [place], [place] [address] Tel. [phone]    Fax. [phone]

## 2015-12-18 DIAGNOSIS — Z779 Other contact with and (suspected) exposures hazardous to health: Secondary | ICD-10-CM | POA: Diagnosis not present

## 2015-12-18 DIAGNOSIS — Z6826 Body mass index (BMI) 26.0-26.9, adult: Secondary | ICD-10-CM | POA: Diagnosis not present

## 2015-12-24 ENCOUNTER — Other Ambulatory Visit: Payer: Self-pay | Admitting: Oncology

## 2016-01-16 ENCOUNTER — Other Ambulatory Visit: Payer: Self-pay

## 2016-01-16 DIAGNOSIS — N390 Urinary tract infection, site not specified: Secondary | ICD-10-CM

## 2016-01-16 DIAGNOSIS — R739 Hyperglycemia, unspecified: Secondary | ICD-10-CM

## 2016-01-19 ENCOUNTER — Other Ambulatory Visit: Payer: Self-pay | Admitting: *Deleted

## 2016-01-19 DIAGNOSIS — Z170 Estrogen receptor positive status [ER+]: Principal | ICD-10-CM

## 2016-01-19 DIAGNOSIS — C801 Malignant (primary) neoplasm, unspecified: Secondary | ICD-10-CM

## 2016-01-19 DIAGNOSIS — C50911 Malignant neoplasm of unspecified site of right female breast: Secondary | ICD-10-CM

## 2016-01-19 DIAGNOSIS — R918 Other nonspecific abnormal finding of lung field: Secondary | ICD-10-CM

## 2016-01-19 DIAGNOSIS — N390 Urinary tract infection, site not specified: Secondary | ICD-10-CM | POA: Diagnosis not present

## 2016-01-19 DIAGNOSIS — R971 Elevated cancer antigen 125 [CA 125]: Secondary | ICD-10-CM

## 2016-01-19 DIAGNOSIS — C7972 Secondary malignant neoplasm of left adrenal gland: Secondary | ICD-10-CM

## 2016-01-19 DIAGNOSIS — R739 Hyperglycemia, unspecified: Secondary | ICD-10-CM | POA: Diagnosis not present

## 2016-01-19 LAB — URINALYSIS W MICROSCOPIC + REFLEX CULTURE
Bilirubin Urine: NEGATIVE
Casts: NONE SEEN [LPF]
Crystals: NONE SEEN [HPF]
GLUCOSE, UA: NEGATIVE
Hgb urine dipstick: NEGATIVE
Ketones, ur: NEGATIVE
NITRITE: NEGATIVE
PROTEIN: NEGATIVE
Specific Gravity, Urine: 1.012 (ref 1.001–1.035)
Squamous Epithelial / LPF: NONE SEEN [HPF] (ref <=5)
Yeast: NONE SEEN [HPF]
pH: 7 (ref 5.0–8.0)

## 2016-01-19 LAB — COMPLETE METABOLIC PANEL WITH GFR
ALT: 20 U/L (ref 6–29)
AST: 30 U/L (ref 10–35)
Albumin: 3.8 g/dL (ref 3.6–5.1)
Alkaline Phosphatase: 132 U/L — ABNORMAL HIGH (ref 33–130)
BILIRUBIN TOTAL: 0.5 mg/dL (ref 0.2–1.2)
BUN: 15 mg/dL (ref 7–25)
CO2: 28 mmol/L (ref 20–31)
CREATININE: 0.81 mg/dL (ref 0.60–0.88)
Calcium: 9.5 mg/dL (ref 8.6–10.4)
Chloride: 98 mmol/L (ref 98–110)
GFR, EST AFRICAN AMERICAN: 72 mL/min (ref >=60)
GFR, Est Non African American: 63 mL/min (ref >=60)
GLUCOSE: 107 mg/dL — AB (ref 65–99)
Potassium: 4.5 mmol/L (ref 3.5–5.3)
Sodium: 134 mmol/L — ABNORMAL LOW (ref 135–146)
TOTAL PROTEIN: 6.6 g/dL (ref 6.1–8.1)

## 2016-01-20 ENCOUNTER — Ambulatory Visit (HOSPITAL_BASED_OUTPATIENT_CLINIC_OR_DEPARTMENT_OTHER): Payer: Medicare Other | Admitting: Oncology

## 2016-01-20 ENCOUNTER — Other Ambulatory Visit (HOSPITAL_BASED_OUTPATIENT_CLINIC_OR_DEPARTMENT_OTHER): Payer: Medicare Other

## 2016-01-20 VITALS — BP 168/56 | HR 64 | Temp 97.4°F | Resp 18 | Ht 61.0 in | Wt 138.9 lb

## 2016-01-20 DIAGNOSIS — C50911 Malignant neoplasm of unspecified site of right female breast: Secondary | ICD-10-CM

## 2016-01-20 DIAGNOSIS — C7972 Secondary malignant neoplasm of left adrenal gland: Secondary | ICD-10-CM | POA: Diagnosis not present

## 2016-01-20 DIAGNOSIS — M818 Other osteoporosis without current pathological fracture: Secondary | ICD-10-CM

## 2016-01-20 DIAGNOSIS — Z170 Estrogen receptor positive status [ER+]: Secondary | ICD-10-CM | POA: Diagnosis not present

## 2016-01-20 DIAGNOSIS — Z79810 Long term (current) use of selective estrogen receptor modulators (SERMs): Secondary | ICD-10-CM

## 2016-01-20 DIAGNOSIS — C801 Malignant (primary) neoplasm, unspecified: Secondary | ICD-10-CM

## 2016-01-20 DIAGNOSIS — H353290 Exudative age-related macular degeneration, unspecified eye, stage unspecified: Secondary | ICD-10-CM

## 2016-01-20 DIAGNOSIS — M810 Age-related osteoporosis without current pathological fracture: Secondary | ICD-10-CM

## 2016-01-20 DIAGNOSIS — R971 Elevated cancer antigen 125 [CA 125]: Secondary | ICD-10-CM | POA: Diagnosis not present

## 2016-01-20 DIAGNOSIS — S22000S Wedge compression fracture of unspecified thoracic vertebra, sequela: Secondary | ICD-10-CM

## 2016-01-20 DIAGNOSIS — R918 Other nonspecific abnormal finding of lung field: Secondary | ICD-10-CM | POA: Diagnosis not present

## 2016-01-20 DIAGNOSIS — S72046P Nondisplaced fracture of base of neck of unspecified femur, subsequent encounter for closed fracture with malunion: Secondary | ICD-10-CM

## 2016-01-20 LAB — COMPREHENSIVE METABOLIC PANEL
ALK PHOS: 149 U/L (ref 40–150)
ALT: 21 U/L (ref 0–55)
ANION GAP: 8 meq/L (ref 3–11)
AST: 31 U/L (ref 5–34)
Albumin: 3.6 g/dL (ref 3.5–5.0)
BILIRUBIN TOTAL: 0.41 mg/dL (ref 0.20–1.20)
BUN: 20.9 mg/dL (ref 7.0–26.0)
CALCIUM: 9.9 mg/dL (ref 8.4–10.4)
CHLORIDE: 97 meq/L — AB (ref 98–109)
CO2: 25 mEq/L (ref 22–29)
CREATININE: 0.8 mg/dL (ref 0.6–1.1)
EGFR: 61 mL/min/{1.73_m2} — ABNORMAL LOW (ref >90)
Glucose: 78 mg/dl (ref 70–140)
Potassium: 4.6 mEq/L (ref 3.5–5.1)
Sodium: 130 mEq/L — ABNORMAL LOW (ref 136–145)
TOTAL PROTEIN: 6.6 g/dL (ref 6.4–8.3)

## 2016-01-20 LAB — CBC WITH DIFFERENTIAL/PLATELET
BASO%: 0.5 % (ref 0.0–2.0)
Basophils Absolute: 0 10*3/uL (ref 0.0–0.1)
EOS%: 5.3 % (ref 0.0–7.0)
Eosinophils Absolute: 0.5 10*3/uL (ref 0.0–0.5)
HEMATOCRIT: 34.1 % — AB (ref 34.8–46.6)
HGB: 11.3 g/dL — ABNORMAL LOW (ref 11.6–15.9)
LYMPH#: 2.6 10*3/uL (ref 0.9–3.3)
LYMPH%: 30.5 % (ref 14.0–49.7)
MCH: 30.9 pg (ref 25.1–34.0)
MCHC: 33.1 g/dL (ref 31.5–36.0)
MCV: 93.2 fL (ref 79.5–101.0)
MONO#: 1 10*3/uL — AB (ref 0.1–0.9)
MONO%: 12 % (ref 0.0–14.0)
NEUT#: 4.4 10*3/uL (ref 1.5–6.5)
NEUT%: 51.7 % (ref 38.4–76.8)
PLATELETS: 207 10*3/uL (ref 145–400)
RBC: 3.66 10*6/uL — ABNORMAL LOW (ref 3.70–5.45)
RDW: 15.1 % — ABNORMAL HIGH (ref 11.2–14.5)
WBC: 8.5 10*3/uL (ref 3.9–10.3)

## 2016-01-20 LAB — DRAW EXTRA CLOT TUBE

## 2016-01-20 MED ORDER — TAMOXIFEN CITRATE 20 MG PO TABS: 20.0000 mg | ORAL_TABLET | Freq: Every day | ORAL | 12 refills | Status: AC

## 2016-01-20 NOTE — Progress Notes (Signed)
The drugs is already [facility]  Telephone:([phone]) [phone] [phone]     ID: Cassandra Glenn DOB: 05/30/1920  MR#: [id number]  CXK#:[id number]  Patient Care Team: Cassandra Dooms, MD as PCP - General (Internal Medicine) Cassandra Man Otho Darner, NP as Nurse Practitioner (Nurse Practitioner) Cassandra Hough, MD as Consulting Physician (Ophthalmology) Cassandra Posey, MD as Consulting Physician (Obstetrics and Gynecology) Cassandra Fraction, MD as Consulting Physician (Ophthalmology) Cassandra Gibbs, MD as Consulting Physician (Radiology) Cassandra Nakayama, MD as Consulting Physician (Orthopedic Surgery) Cassandra Fraction, MD as Referring Physician (Ophthalmology) Cassandra Hughs, MD as Attending Physician (Urology) Cassandra Cruel, MD as Consulting Physician (Oncology) Cassandra Cruel, MD OTHER MD:  CHIEF COMPLAINT: 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 At the last visit we obtained baseline lab work, which included a CA 125, mildly elevated at 55.3, and a CA-19-9 which was normal at 30. CEA previously obtained was also normal at 1.45.  On 12/03/2015 she underwent biopsy of her left adrenal gland, and this showed (SZ [id number] [phone]) which showed adenocarcinoma, estrogen receptor 100% positive, progesterone receptor 40% positive, both with strong staining intensity, and HER-2 nonamplified with a signals ratio of 1.47 and the number per cell 2.35.  This confirms that we are dealing with metastatic breast cancer, most likely from her original 2004 tumors. The patient is here today to discuss the implications of these findings  REVIEW OF SYSTEMS: Cassandra Glenn is generally doing well. [facility] She feels tired, but she has to walk a long way to the  dining hall which she does at least twice daily. She denies unusual headaches, visual changes, nausea, vomiting, dizziness, or falls. She does have macular degeneration and describes her vision as. She denies a cough or phlegm production. There has been no change in bowel or bladder habits. She has chronic back pain secondary to her compression fractures in the lumbar spine. A detailed review of systems today was otherwise noncontributory  BREAST CANCER HISTORY: From the original intake note:  I saw Cassandra Glenn approximately approximately 13 years ago when she was diagnosed with right-sided invasive breast cancer, which was treated with radiation and tamoxifen for 5 years. She also had a noninvasive breast cancer removed from the left breast at the same time.  More recently Cassandra Glenn had a chest x-ray 10/22/2015 for evaluation of a cough. The film showed a variety of incidental findings including atherosclerosis of the thoracic aorta and an old lower thoracic compression fracture. There was also however a right apical density which required further evaluation. Accordingly on 10/27/2015 she had a chest CT scan which found chronic areas of scarring in both lungs, not significantly changed from November 2015. However right middle lobe pleural thickening appeared worse, and there was increased nodular pleural thickening along the azygo-esophageal recess, suggestive of an indolent mesothelioma. There was a small cluster of nodules at the medial right lung base which was new from the prior study. There were no bony metastases but there were multiple chronic compression deformities and degenerative change.   Given these results, a PET scan was obtained 11/07/2015. There was a hypermetabolic right paratracheal lymph node measuring 1.2 cm, but no additional mediastinal adenopathy. Along the pleural surface of the azygo-esophageal region there was increased metabolic activity with an SUV max of 20.9. There  was also intense  metabolic activity associated with a thickened left adrenal gland, with an SUV max of 10.8. There was some hypermetabolic activity associated with the right adrenal gland. There were again no bony metastatic disease findings.  With this information the patient returns for further evaluation and treatment.   PAST MEDICAL HISTORY: Past Medical History:  Diagnosis Date  . Abnormalities of the hair 02/29/2012  . Arthritis   . Cancer ([place])   . Candidiasis of skin and nails 03/19/2011  . Closed fracture of base of neck of femur ([place]) 03/18/2011  . Contact dermatitis and other eczema, due to unspecified cause 05/07/2011  . Depression 07/23/2014  . Diverticulosis of colon (without mention of hemorrhage) 03/19/2011  . Edema 05/07/2011  . HOH (hard of hearing)   . Hypopotassemia 03/26/2011  . Hyposmolality and/or hyponatremia 03/19/2011  . Hypotension, unspecified 03/18/2011  . Impaired fasting glucose 03/19/2011  . Insomnia, unspecified 03/19/2011  . Kidney infection   . Lumbago 03/19/2011  . Macular degeneration   . Malignant neoplasm of breast (female), unspecified site 10/28/2002  . Osteoporosis, unspecified 03/19/2011  . Other malaise and fatigue 03/19/2011  . Pain in joint, pelvic region and thigh 03/19/2011  . Right bundle branch block 08/30/2011  . Shortness of breath   . Spasm of muscle 03/19/2011  . Spinal stenosis, lumbar region, with neurogenic claudication 11/02/2011  . Trigger finger (acquired) 11/02/2011  . Unspecified constipation 03/19/2011  . Vaginitis and vulvovaginitis 02/29/2012  . Vertigo 08/26/2015    PAST SURGICAL HISTORY: Past Surgical History:  Procedure Laterality Date  . BREAST SURGERY Bilateral 2005-10/27/2004   lumpectomy- Streck,MD  . CATARACT EXTRACTION  2010   bialteral  . EYE SURGERY     cataract  . FRACTURE SURGERY Left 09/1980   ankle  . HARDWARE REMOVAL Right 05/28/2014   Procedure: HARDWARE REMOVAL;  Surgeon: Cassandra Nakayama, MD;  Location:  [facility];  Service: Orthopedics;  Laterality: Right;  . HIP PINNING,CANNULATED  03/08/2011   Procedure: CANNULATED HIP PINNING;  Surgeon: Cassandra Hai, MD;  Location: [facility];  Service: Orthopedics;  Laterality: Right;  . ORIF HIP FRACTURE Right 03/08/2011   Bean,MD  . SKIN CANCER EXCISION Left 10/12/2012   lower leg Dr. [place]  . SKIN LESION EXCISION Right 02/04/2011   Abdomen lesion spongiotic dermatitis-Taffeen, MD  . SQUAMOUS CELL CARCINOMA EXCISION Right 02/04/2011   forearmSyble Creek, MD  . TONSILLECTOMY    . TOTAL HIP ARTHROPLASTY Right 05/28/2014   Procedure: TOTAL HIP ARTHROPLASTY ANTERIOR APPROACH;  Surgeon: Cassandra Nakayama, MD;  Location: [facility];  Service: Orthopedics;  Laterality: Right;    FAMILY HISTORY Family History  Problem Relation Age of Onset  . Heart disease Mother   . Cancer Father   . Emphysema Brother   . Alzheimer's disease Brother   The patient's father died at the age of 89 with colon cancer. The patient's mother died at the age of 87 from heart disease the patient had 2 brothers, no sisters. Both brothers have died, one from emphysema and one from dementia.  GYNECOLOGIC HISTORY:  No LMP recorded. Patient is postmenopausal. Menarche age 56, first live birth age 71, the patient is [place] P2. She stopped having periods around age 60. She never took hormone replacement.  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 She is widowed. She lives in independent living section of friend[facility] home [place]. Her daughter Mardene Celeste lives in [place] (actually [place])  where she teaches. Daughter Cassandra Glenn lives in [place] and is Web designer at [employer]. The patient has 5 grandchildren and 2 step grandchildren. She has one great grandchild and one on the way. She attends [employer]    ADVANCED DIRECTIVES: Kenedy has named both her daughters as joint health care power of attorney.  She tells me she has a living will "somewhere in the house" and has a DO NOT RESUSCITATE order [facility] at [facility].   HEALTH MAINTENANCE: Social History  Substance Use Topics  . Smoking status: Never Smoker  . Smokeless tobacco: Never Used  . Alcohol use 0.0 oz/week     Comment: occasional glass     Colonoscopy:  PAP:  Bone density:   No Known Allergies  Current Outpatient Prescriptions  Medication Sig Dispense Refill  . acetaminophen (TYLENOL) 500 MG tablet Take 1,000 mg by mouth every 6 (six) hours as needed for moderate pain.     Marland Kitchen aspirin 81 MG chewable tablet Chew 81 mg by mouth every morning.     . Bilberry 1000 MG CAPS Take 1,000 mg by mouth every morning.     . Calcium Carbonate-Vitamin D (CALCIUM 600/VITAMIN D PO) Take 600 mg by mouth 2 (two) times daily.     . Calcium Polycarbophil (FIBERCON PO) Take 1 tablet by mouth 2 (two) times daily.     . cholecalciferol (VITAMIN D) 400 UNITS TABS Take 400 Units by mouth every morning.     Marland Kitchen CRANBERRY EXTRACT PO Take 1 tablet by mouth every morning.     . fish oil-omega-3 fatty acids 1000 MG capsule Take 1 g by mouth every morning.     . Glucosamine-Chondroitin (GLUCOSAMINE CHONDR COMPLEX PO) Take 1 tablet by mouth 2 (two) times daily. for joints    . ibuprofen (ADVIL,MOTRIN) 200 MG tablet Take 200 mg by mouth. Take 2 tablets as needed for inflammatory pain    . meclizine (ANTIVERT) 25 MG tablet Take 1 tablet (25 mg total) by mouth 3 (three) times daily as needed for dizziness. 30 tablet 0  . Multiple Vitamins-Minerals (PRESERVISION/LUTEIN) CAPS Take 1 capsule by mouth 2 (two) times daily.    Marland Kitchen nystatin (MYCOSTATIN/NYSTOP) powder Apply to affected skin daily to treat yeast 45 g 4  . Probiotic Product (RESTORA) CAPS Take 1 capsule by mouth every morning.     . trimethoprim (TRIMPEX) 100 MG tablet Take 100 mg by mouth at bedtime.    Marland Kitchen zolpidem (AMBIEN) 5 MG tablet Take one half tablet at bedtime if needed for rest 15 tablet 5    No current facility-administered medications for this visit.     OBJECTIVE: Older white woman In no acute distress Vitals:   01/20/16 1147  BP: (!) 168/56  Pulse: 64  Resp: 18  Temp: 97.4 F (36.3 C)     Body mass index is 26.24 kg/m.    ECOG FS:1 - Symptomatic but completely ambulatory  Sclerae unicteric, EOMs intact Oropharynx clear and moist No cervical or supraclavicular adenopathy Lungs no rales or rhonchi Heart regular rate and rhythm Abd soft, nontender, positive bowel sounds MSK scoliosis but no focal spinal tenderness, no upper extremity lymphedema Neuro: nonfocal, well oriented, appropriate affect Breasts: Deferred    LAB RESULTS:  CMP     Component Value Date/Time   NA 134 (L) 01/19/2016 0800   NA 134 (L) 12/02/2015 1301   K 4.5 01/19/2016 0800   K 4.3 12/02/2015 1301   CL 98 01/19/2016 0800  CO2 28 01/19/2016 0800   CO2 25 12/02/2015 1301   GLUCOSE 107 (H) 01/19/2016 0800   GLUCOSE 102 12/02/2015 1301   BUN 15 01/19/2016 0800   BUN 14.7 12/02/2015 1301   CREATININE 0.81 01/19/2016 0800   CREATININE 0.8 12/02/2015 1301   CALCIUM 9.5 01/19/2016 0800   CALCIUM 10.2 12/02/2015 1301   PROT 6.6 01/19/2016 0800   PROT 7.0 12/02/2015 1301   ALBUMIN 3.8 01/19/2016 0800   ALBUMIN 3.7 12/02/2015 1301   AST 30 01/19/2016 0800   AST 21 12/02/2015 1301   ALT 20 01/19/2016 0800   ALT 18 12/02/2015 1301   ALKPHOS 132 (H) 01/19/2016 0800   ALKPHOS 148 12/02/2015 1301   BILITOT 0.5 01/19/2016 0800   BILITOT 0.42 12/02/2015 1301   GFRNONAA 63 01/19/2016 0800   GFRAA 72 01/19/2016 0800    INo results found for: SPEP, UPEP  Lab Results  Component Value Date   WBC 8.5 01/20/2016   NEUTROABS 4.4 01/20/2016   HGB 11.3 (L) 01/20/2016   HCT 34.1 (L) 01/20/2016   MCV 93.2 01/20/2016   PLT 207 01/20/2016      Chemistry      Component Value Date/Time   NA 134 (L) 01/19/2016 0800   NA 134 (L) 12/02/2015 1301   K 4.5 01/19/2016 0800   K 4.3 12/02/2015  1301   CL 98 01/19/2016 0800   CO2 28 01/19/2016 0800   CO2 25 12/02/2015 1301   BUN 15 01/19/2016 0800   BUN 14.7 12/02/2015 1301   CREATININE 0.81 01/19/2016 0800   CREATININE 0.8 12/02/2015 1301   GLU 90 07/24/2015      Component Value Date/Time   CALCIUM 9.5 01/19/2016 0800   CALCIUM 10.2 12/02/2015 1301   ALKPHOS 132 (H) 01/19/2016 0800   ALKPHOS 148 12/02/2015 1301   AST 30 01/19/2016 0800   AST 21 12/02/2015 1301   ALT 20 01/19/2016 0800   ALT 18 12/02/2015 1301   BILITOT 0.5 01/19/2016 0800   BILITOT 0.42 12/02/2015 1301       Lab Results  Component Value Date   LABCA2 19 11/07/2007    No components found for: [id number]  No results for input(s): INR in the last 168 hours.  Urinalysis    Component Value Date/Time   COLORURINE GREEN (A) 01/19/2016 0800   APPEARANCEUR CLEAR 01/19/2016 0800   LABSPEC 1.012 01/19/2016 0800   PHURINE 7.0 01/19/2016 0800   GLUCOSEU NEGATIVE 01/19/2016 0800   HGBUR NEGATIVE 01/19/2016 0800   BILIRUBINUR NEGATIVE 01/19/2016 0800   BILIRUBINUR negative 07/03/2015 1208   BILIRUBINUR neg 08/23/2014 1539   KETONESUR NEGATIVE 01/19/2016 0800   PROTEINUR NEGATIVE 01/19/2016 0800   UROBILINOGEN 0.2 07/03/2015 1208   UROBILINOGEN 0.2 05/30/2014 1111   NITRITE NEGATIVE 01/19/2016 0800   LEUKOCYTESUR 3+ (A) 01/19/2016 0800     STUDIES: No results found.  ELIGIBLE FOR AVAILABLE RESEARCH PROTOCOL: no  ASSESSMENT: 27 y.o. [place] woman residing at friends homes [place]  (1) status post right lumpectomy 2004 for an invasive breast cancer, treated with radiation and tamoxifen for 5 years  (a) Also status post left lumpectomy for ductal carcinoma in situ  METASTATIC DISEASE: November 2017 (2) chest CT scan and PET scan obtained October 2017 show a hot 1.2 cm right paratracheal lymph node, a rim of activity along the pleural surface of the a a zygo-esophageal region, and bilateral adrenal metastases, left greater than right   (a)  biopsy of the left adrenal gland 12/03/2015  shows metastatic carcinoma, estrogen and progesterone receptor positive, HER-2 nonamplified  (3) tamoxifen started 01/20/2015  PLAN: I spent approximately 45 minutes today with [facility] in her daughter going over Nelli's situation in detail. At least half of that time was counseling regarding the fact that this tumor is not curable but it is very treatable, that we hope to turn into a chronic illness, and that the main form of treatment we will use is anti-estrogens.  We discussed the difference between tamoxifen and anastrozole in detail. She understands that anastrozole and the aromatase inhibitors in general work by blocking estrogen production. Accordingly vaginal dryness, decrease in bone density, and of course hot flashes can result. The aromatase inhibitors can also negatively affect the cholesterol profile, although that is a minor effect. One out of 5 women on aromatase inhibitors we will feel "old and achy". This arthralgia/myalgia syndrome, which resembles fibromyalgia clinically, does resolve with stopping the medications. Accordingly this is not a reason to not try an aromatase inhibitor but it is a frequent reason to stop it (in other words 20% of women will not be able to tolerate these medications).  Tamoxifen on the other hand does not block estrogen production. It does not "take away a woman's estrogen". It blocks the estrogen receptor in breast cells. Like anastrozole, it can also cause hot flashes. As opposed to anastrozole, tamoxifen has many estrogen-like effects. It is technically an estrogen receptor modulator. This means that in some tissues tamoxifen works like estrogen-- for example it helps strengthen the bones. It tends to improve the cholesterol profile. It can cause thickening of the endometrial lining, and even endometrial polyps or rarely cancer of the uterus.(The risk of uterine cancer due to tamoxifen is one additional cancer  per thousand women year). It can cause vaginal wetness or stickiness. It can cause blood clots through this estrogen-like effect--the risk of blood clots with tamoxifen is exactly the same as with birth control pills or hormone replacement.  Neither of these agents causes mood changes or weight gain, despite the popular belief that they can have these side effects. We have data from studies comparing either of these drugs with placebo, and in those cases the control group had the same amount of weight gain and depression as the group that took the drug.  The big concern with Terre of course is osteoporosis. She understands that if she does choose and anastrozole we can add denosumab/Prolia and that likely would take care of the problem. However since she took tamoxifen in the past with no complications, including no evidence of blood clots, she would prefer to return to that medication and I'm comfortable with that.  We discussed that she would know if she had a blood clot and what symptoms might cause her to call us or to call directly to the emergency room.  She is going to see me again in 3 months. Before that visit we will obtain a restaging PET scan. The plan at this point is to continue anti-estrogens indefinitely. She knows to call for any problems that may develop before the next visit here.  Cassandra Cruel, MD   01/20/2016 12:11 PM Medical Oncology and Hematology [facility] [address] [place], [place] [address] Tel. [phone]    Fax. [phone]

## 2016-01-21 LAB — CA 125: Cancer Antigen (CA) 125: 59.8 U/mL — ABNORMAL HIGH (ref 0.0–38.1)

## 2016-01-21 LAB — URINE CULTURE

## 2016-01-26 ENCOUNTER — Other Ambulatory Visit: Payer: Self-pay | Admitting: Internal Medicine

## 2016-01-26 ENCOUNTER — Other Ambulatory Visit: Payer: Self-pay

## 2016-01-26 MED ORDER — CIPROFLOXACIN HCL 500 MG PO TABS: 500.0000 mg | ORAL_TABLET | Freq: Two times a day (BID) | ORAL | 0 refills | Status: DC

## 2016-01-27 ENCOUNTER — Encounter: Payer: Self-pay | Admitting: Internal Medicine

## 2016-01-27 ENCOUNTER — Non-Acute Institutional Stay: Payer: Medicare Other | Admitting: Internal Medicine

## 2016-01-27 VITALS — BP 138/64 | HR 62 | Temp 97.2°F | Ht 61.0 in | Wt 143.0 lb

## 2016-01-27 DIAGNOSIS — R739 Hyperglycemia, unspecified: Secondary | ICD-10-CM

## 2016-01-27 DIAGNOSIS — M4125 Other idiopathic scoliosis, thoracolumbar region: Secondary | ICD-10-CM | POA: Diagnosis not present

## 2016-01-27 DIAGNOSIS — C7972 Secondary malignant neoplasm of left adrenal gland: Secondary | ICD-10-CM | POA: Diagnosis not present

## 2016-01-27 DIAGNOSIS — N390 Urinary tract infection, site not specified: Secondary | ICD-10-CM | POA: Diagnosis not present

## 2016-01-27 DIAGNOSIS — M4120 Other idiopathic scoliosis, site unspecified: Secondary | ICD-10-CM | POA: Insufficient documentation

## 2016-01-27 DIAGNOSIS — R918 Other nonspecific abnormal finding of lung field: Secondary | ICD-10-CM | POA: Diagnosis not present

## 2016-01-27 DIAGNOSIS — E871 Hypo-osmolality and hyponatremia: Secondary | ICD-10-CM | POA: Diagnosis not present

## 2016-01-27 HISTORY — DX: Other idiopathic scoliosis, site unspecified: M41.20

## 2016-01-27 NOTE — Progress Notes (Signed)
[place] [facility] Service: Clinic (12)     No Known Allergies  Chief Complaint  Patient presents with  . Medical Management of Chronic Issues    6 month medication management UTI, depression, hyperglycemia, review labs. Here with daughter [name].    HPI:  CT bx of the left adrenal gland on 12/03/15. Path: metastatic breast cancer.There is evidence on PET scan of bone metastases as well. In consultation with Dr. Jana Hakim, she has decided to take Tamoxifen. She is scheduled for follow up PET scan in May 2018.  UTI diagnosed from culture 01/19/16. Treated with Cipro.  Has some intermittent pains in the back at the thoracolumbar area. She has known significant scoliosis. Advil relieves the pain.  Trivial hyponatremia persists on lab. Last glucose was normal.  Medications: Patient's Medications  New Prescriptions   No medications on file  Previous Medications   ACETAMINOPHEN (TYLENOL) 500 MG TABLET    Take 1,000 mg by mouth every 6 (six) hours as needed for moderate pain.    ASPIRIN 81 MG CHEWABLE TABLET    Chew 81 mg by mouth every morning.    BILBERRY 1000 MG CAPS    Take 1,000 mg by mouth every morning.    CALCIUM CARBONATE-VITAMIN D (CALCIUM 600/VITAMIN D PO)    Take 600 mg by mouth 2 (two) times daily.    CALCIUM POLYCARBOPHIL (FIBERCON PO)    Take 1 tablet by mouth 2 (two) times daily.    CHOLECALCIFEROL (VITAMIN D) 400 UNITS TABS    Take 400 Units by mouth every morning.    CIPROFLOXACIN (CIPRO) 500 MG TABLET    Take 1 tablet (500 mg total) by mouth 2 (two) times daily.   CRANBERRY EXTRACT PO    Take 1 tablet by mouth every morning.    FISH OIL-OMEGA-3 FATTY ACIDS 1000 MG CAPSULE    Take 1 g by mouth every morning.    FLUZONE HIGH-DOSE 0.5 ML SUSY    ADMINISTER 0.5ML IN THE MUSCLE AS DIRECTED   GLUCOSAMINE-CHONDROITIN (GLUCOSAMINE CHONDR COMPLEX PO)    Take 1 tablet by mouth 2 (two) times daily. for joints   IBUPROFEN (ADVIL,MOTRIN) 200 MG TABLET    Take 200 mg by  mouth. Take 2 tablets as needed for inflammatory pain   MECLIZINE (ANTIVERT) 25 MG TABLET    Take 1 tablet (25 mg total) by mouth 3 (three) times daily as needed for dizziness.   MULTIPLE VITAMINS-MINERALS (PRESERVISION/LUTEIN) CAPS    Take 1 capsule by mouth 2 (two) times daily.   NYSTATIN (MYCOSTATIN/NYSTOP) POWDER    Apply to affected skin daily to treat yeast   PROBIOTIC PRODUCT (CULTURELLE PRO-WELL PO)    Take by mouth. Take one probiotic tablet in morning   TAMOXIFEN (NOLVADEX) 20 MG TABLET    Take 1 tablet (20 mg total) by mouth daily.   TRIMETHOPRIM (TRIMPEX) 100 MG TABLET    Take 100 mg by mouth at bedtime.   ZOLPIDEM (AMBIEN) 5 MG TABLET    Take one half tablet at bedtime if needed for rest  Modified Medications   No medications on file  Discontinued Medications   PROBIOTIC PRODUCT (RESTORA) CAPS    Take 1 capsule by mouth every morning.      Review of Systems  Constitutional: Positive for activity change and appetite change. Negative for fatigue, fever and unexpected weight change.  HENT: Positive for hearing loss (bilateral earing aides). Negative for ear pain.   Eyes:  History of loss of visual acuity and macular degeneration.  Respiratory:       History of breast discomfort. History of removal of a lump of cancer from the right breast. Dyspnea on exertion.  Cardiovascular: Positive for leg swelling. Negative for chest pain and palpitations.  Gastrointestinal:       She frequently complains that her intestines are not working right. There is a previous history of diverticulosis. Her last colonoscopy was in 2008. She has been seen by Dr. Paulita Fujita in the past. Constipation - she associates with Tramadol  Genitourinary: Positive for decreased urine volume, difficulty urinating and frequency. Negative for dysuria, vaginal bleeding and vaginal discharge.       Complains of a slow, weak urinary stream. Denies dysuria. Has increased frequency and nocturia. Saw Dr. Louis Meckel,  urologist. She was taught self-catheterization for a bladder that does not empty well. Vaginal discomfort. Managed by Dr. Claudean Kinds. History of elevated CEA 125. Normal ultrasound of the abdomen and genitourinary tract. Recurrent UTI.  Musculoskeletal: Positive for back pain.       Chronic back pain.  Patient has a shorter right leg and has a lift in the right shoe. Now seeing Dr. Rhona Raider.  Right hip arthroplasty 5/16 There is generalized stiffness in the joints. There is a history of spinal stenosis. Left third finger catches on extension sometimes. There is a trigger finger. Pain at the left iliac crest and left anterior ribs near the sternum.  Skin:       History of skin discoloration and changes of nails.  Rash on the left scapula, right forearm, and left forearm. [place] in late Oct 2017 and contused the left side of the back.  Allergic/Immunologic: Negative.   Neurological: Positive for dizziness. Negative for tremors, seizures and numbness.       History of spinal stenosis with neurogenic claudication affecting the right leg. Complains of pain in the buttocks that is suggestive of neuralgia from her spinal stenosis. Hospitalized for 24 hours 08/24/2015 for vertigo.  Hematological: Bruises/bleeds easily.  Psychiatric/Behavioral: Negative.     Vitals:   01/27/16 0904  BP: 138/64  Pulse: 62  Temp: 97.2 F (36.2 C)  TempSrc: Oral  SpO2: 97%  Weight: 143 lb (64.9 kg)  Height: 5' 1"  (1.549 m)   Wt Readings from Last 3 Encounters:  01/27/16 143 lb (64.9 kg)  01/20/16 138 lb 14.4 oz (63 kg)  12/12/15 140 lb 11.2 oz (63.8 kg)    Body mass index is 27.02 kg/m.  Physical Exam  Constitutional: She is oriented to person, place, and time. She appears well-developed and well-nourished. No distress.  HENT:  HOH Nonocclusive wax in both EAC.  Eyes:  Corrective lenses.  Neck: No JVD present. No tracheal deviation present. No thyromegaly present.  Cardiovascular: Normal rate,  regular rhythm and normal heart sounds.  Exam reveals no gallop and no friction rub.   No murmur heard. Pulses in RLE not palpable, has 2+ edema which could be obscuring pulses  Pulmonary/Chest: Effort normal and breath sounds normal. She has no rales.  Abdominal: Soft. Bowel sounds are normal. She exhibits no distension and no mass. There is no tenderness.  Musculoskeletal: Normal range of motion. She exhibits edema (RLE) and tenderness (T12-L1 area).  Poor balance; using walker with PT following right hip replacement  Lymphadenopathy:    She has no cervical adenopathy.  Neurological: She is alert and oriented to person, place, and time. No cranial nerve deficit.  Skin: Skin is warm and  dry. There is pallor.  Ovoid reddish patch at the left scapula. I am unable to find any blisters on this patch. Right forearm has a larger firm lump which has a reddish covering. Nontender. Left forearm has a reddish patch, but no lump of tissue under the patch. Left side of the back has a resolving hand sized bruise sustained in a contusion after a fall.  Psychiatric: She has a normal mood and affect. Her behavior is normal. Thought content normal.     Labs reviewed: Lab Summary Latest Ref Rng & Units 01/20/2016 01/19/2016 12/03/2015  Hemoglobin 11.6 - 15.9 g/dL 11.3(L) (None) 12.2  Hematocrit 34.8 - 46.6 % 34.1(L) (None) 36.0  White count 3.9 - 10.3 10e3/uL 8.5 (None) 8.0  Platelet count 145 - 400 10e3/uL 207 (None) 232  Sodium 136 - 145 mEq/L 130(L) 134(L) 133(L)  Potassium 3.5 - 5.1 mEq/L 4.6 4.5 4.2  Calcium 8.4 - 10.4 mg/dL 9.9 9.5 9.3  Phosphorus - (None) (None) (None)  Creatinine 0.6 - 1.1 mg/dL 0.8 0.81 0.80  AST 5 - 34 U/L 31 30 (None)  Alk Phos 40 - 150 U/L 149 132(H) (None)  Bilirubin 0.20 - 1.20 mg/dL 0.41 0.5 (None)  Glucose 70 - 140 mg/dl 78 107(H) 118(H)  Cholesterol - (None) (None) (None)  HDL cholesterol - (None) (None) (None)  Triglycerides - (None) (None) (None)  LDL Direct - (None)  (None) (None)  LDL Calc - (None) (None) (None)  Total protein 6.4 - 8.3 g/dL 6.6 6.6 (None)  Albumin 3.5 - 5.0 g/dL 3.6 3.8 (None)  Some recent data might be hidden   Lab Results  Component Value Date   TSH 3.215 08/25/2015   Lab Results  Component Value Date   BUN 20.9 01/20/2016   BUN 15 01/19/2016   BUN 19 12/03/2015   Lab Results  Component Value Date   CREATININE 0.8 01/20/2016   CREATININE 0.81 01/19/2016   CREATININE 0.80 12/03/2015   Lab Results  Component Value Date   HGBA1C 5.9 01/23/2015       Assessment/Plan  1. 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 Urinary tract infection without hematuria, site unspecified, recurrent resolved  3. Hyponatremia Persistent, mild  4. Hyperglycemia Normal glucose on last lab  5. Mass of right lung PET scan in May 2018  6. Other idiopathic scoliosis, thoracolumbar region Contributes to back pains. Continue Advil as needed. If pains worsen, call for further evaluation. I doubt metastatic disease as origin of pains, but must keep in mind.

## 2016-02-04 ENCOUNTER — Telehealth: Payer: Self-pay | Admitting: *Deleted

## 2016-02-04 ENCOUNTER — Encounter: Payer: Self-pay | Admitting: Nurse Practitioner

## 2016-02-04 ENCOUNTER — Ambulatory Visit (HOSPITAL_COMMUNITY)
Admission: RE | Admit: 2016-02-04 | Discharge: 2016-02-04 | Disposition: A | Discharge status: Home / Self Care | Payer: Medicare Other | Source: Ambulatory Visit | Attending: Nurse Practitioner | Admitting: Nurse Practitioner

## 2016-02-04 ENCOUNTER — Ambulatory Visit (HOSPITAL_BASED_OUTPATIENT_CLINIC_OR_DEPARTMENT_OTHER): Payer: Medicare Other | Admitting: Nurse Practitioner

## 2016-02-04 VITALS — BP 206/78 | HR 77 | Temp 98.1°F | Resp 18 | Ht 61.0 in | Wt 141.9 lb

## 2016-02-04 DIAGNOSIS — C50911 Malignant neoplasm of unspecified site of right female breast: Secondary | ICD-10-CM

## 2016-02-04 DIAGNOSIS — R600 Localized edema: Secondary | ICD-10-CM

## 2016-02-04 DIAGNOSIS — R030 Elevated blood-pressure reading, without diagnosis of hypertension: Secondary | ICD-10-CM | POA: Diagnosis not present

## 2016-02-04 DIAGNOSIS — Z170 Estrogen receptor positive status [ER+]: Secondary | ICD-10-CM

## 2016-02-04 DIAGNOSIS — Z79810 Long term (current) use of selective estrogen receptor modulators (SERMs): Secondary | ICD-10-CM

## 2016-02-04 DIAGNOSIS — R609 Edema, unspecified: Secondary | ICD-10-CM | POA: Insufficient documentation

## 2016-02-04 NOTE — Assessment & Plan Note (Signed)
Patient's blood pressure on initial check today was 206/78.  Patient states that she does not have a history of hypertension; he does not take any blood pressure medication or fluid pills.  She states that she has what is called White coat syndrome; and her blood pressure is always elevated when she is at the doctor's office.  She states that she will recheck her blood pressure when she returns back home and alert.  Her family doctor if her blood pressure remains elevated.  Also, patient was advised to go directly to the emergency department for any worsening symptoms whatsoever.

## 2016-02-04 NOTE — Progress Notes (Signed)
*  Preliminary Results* Right lower extremity venous duplex completed. Right lower extremity is negative for deep vein thrombosis. There is no evidence of right Baker's cyst.  02/04/2016 2:05 PM  Maudry Mayhew, BS, RVT, RDCS, RDMS

## 2016-02-04 NOTE — Assessment & Plan Note (Signed)
Patient started taking the tamoxifen oral therapy on 01/20/2016.  She presented to the [facility] today with complaint of a 24 history of bilateral lower extremity edema.  She denies any other new symptoms whatsoever.  See further notes for details of today's visit.  There is no follow-up scheduled for the patient as of yet here at the cancer center.  Dr. Virgie Dad most recent note states that patient will obtain a PET scan for restaging purposes prior to returning for follow-up visit in approximately 3 months-which would mean that the patient would have a follow-up visit around 04/19/2016.

## 2016-02-04 NOTE — Progress Notes (Signed)
Has swelling in both lower extremities right leg > left leg.  Right leg leg feels 'tight' to patient. Shiny skin on anterior portion of leg. Denies calf pain  No area of unusual warmth/redness other than some redness on right shin

## 2016-02-04 NOTE — Assessment & Plan Note (Signed)
Patient states that she has just noticed some peripheral edema to her bilateral lower extremities within the past 24 hours or so.  She denies any calf tenderness.  She also denies any chest pain, chest pressure, shortness of breath, or pain with inspiration.  She denies any recent fevers or chills.  She states that she started taking tamoxifen approximate 10-14 days ago; is concerned that tamoxifen may be causing the swelling.  Also, patient states that she has a problem with low sodium; suspicious sodium on a regular basis.  Exam today of bilateral lower extremities reveal +1 edema to bilateral lower legs; with the right slightly greater than the left.  Doppler ultrasound obtained today of the right leg was negative for DVT.  Reviewed all findings with Dr. Jana Hakim; he recommended that patient elevate her legs above the level of her heart whenever at rest, remain as active as possible, consider using compression stockings; and also to follow-up with her primary care provider regarding the peripheral edema issues.  Patient was advised to call/return of her directly to the emergency department for any worsening symptoms whatsoever.

## 2016-02-04 NOTE — Progress Notes (Signed)
SYMPTOM MANAGEMENT CLINIC    Chief Complaint: Peripheral edema  HPI:  Cassandra Glenn 83 y.o. female diagnosed with breast cancer with metastasis.  Currently undergoing tamoxifen oral therapy.    No history exists.    Review of Systems  Cardiovascular: Positive for leg swelling.  All other systems reviewed and are negative.   Past Medical History:  Diagnosis Date  . Abnormalities of the hair 02/29/2012  . Arthritis   . Cancer ([place])   . Candidiasis of skin and nails 03/19/2011  . Closed fracture of base of neck of femur ([place]) 03/18/2011  . Contact dermatitis and other eczema, due to unspecified cause 05/07/2011  . Depression 07/23/2014  . Diverticulosis of colon (without mention of hemorrhage) 03/19/2011  . Edema 05/07/2011  . HOH (hard of hearing)   . Hypopotassemia 03/26/2011  . Hyposmolality and/or hyponatremia 03/19/2011  . Hypotension, unspecified 03/18/2011  . Impaired fasting glucose 03/19/2011  . Insomnia, unspecified 03/19/2011  . Kidney infection   . Lumbago 03/19/2011  . Macular degeneration   . Malignant neoplasm of breast (female), unspecified site 10/28/2002  . Osteoporosis, unspecified 03/19/2011  . Other malaise and fatigue 03/19/2011  . Pain in joint, pelvic region and thigh 03/19/2011  . Right bundle branch block 08/30/2011  . Shortness of breath   . Spasm of muscle 03/19/2011  . Spinal stenosis, lumbar region, with neurogenic claudication 11/02/2011  . Trigger finger (acquired) 11/02/2011  . Unspecified constipation 03/19/2011  . Vaginitis and vulvovaginitis 02/29/2012  . Vertigo 08/26/2015    Past Surgical History:  Procedure Laterality Date  . BREAST SURGERY Bilateral 2005-10/27/2004   lumpectomy- Streck,MD  . CATARACT EXTRACTION  2010   bialteral  . EYE SURGERY     cataract  . FRACTURE SURGERY Left 09/1980   ankle  . HARDWARE REMOVAL Right 05/28/2014   Procedure: HARDWARE REMOVAL;  Surgeon: Melrose Nakayama, MD;  Location: [facility];   Service: Orthopedics;  Laterality: Right;  . HIP PINNING,CANNULATED  03/08/2011   Procedure: CANNULATED HIP PINNING;  Surgeon: Johnn Hai, MD;  Location: [facility];  Service: Orthopedics;  Laterality: Right;  . ORIF HIP FRACTURE Right 03/08/2011   Bean,MD  . SKIN CANCER EXCISION Left 10/12/2012   lower leg Dr. [place]  . SKIN LESION EXCISION Right 02/04/2011   Abdomen lesion spongiotic dermatitis-Taffeen, MD  . SQUAMOUS CELL CARCINOMA EXCISION Right 02/04/2011   forearmSyble Creek, MD  . TONSILLECTOMY    . TOTAL HIP ARTHROPLASTY Right 05/28/2014   Procedure: TOTAL HIP ARTHROPLASTY ANTERIOR APPROACH;  Surgeon: Melrose Nakayama, MD;  Location: [facility];  Service: Orthopedics;  Laterality: Right;    has Closed fracture of base of neck of femur ([place]); Cancer of right breast, stage 1, estrogen receptor positive ([place]); Compression fracture of thoracic vertebra ([place]); Insomnia; Urinary frequency; History of fall; Hyponatremia; Hyperglycemia; UTI (urinary tract infection); Abnormal CT scan, chest; Purpura senilis ([place]); TMJ click; Cancer antigen 125 (CA 125) elevation; IBS (irritable bowel syndrome); Primary osteoarthritis of right hip; Urinary retention; Constipation; Candidiasis of skin; Depression; Osteoporosis; Cellophane retinopathy; Chorioretinal scar, macular; AMD (age-related macular degeneration), wet ([place]); Nonexudative age-related macular degeneration; Posterior vitreous detachment; History of surgical procedure; Herpes simplex; Syncope; Lightheadedness; BPV (benign positional vertigo); Vertigo; Mass of right lung; Abnormal PET scan of mediastinum; Contusion; Metastasis to adrenal gland ([place]); Idiopathic scoliosis; Peripheral edema; and Elevated blood pressure reading on her problem list.    has No Known Allergies.  Allergies as of 02/04/2016   No Known Allergies  Medication List       Accurate as of 02/04/16  3:18 PM. Always use your most recent med list.          acetaminophen 500 MG  tablet Commonly known as:  TYLENOL Take 1,000 mg by mouth every 6 (six) hours as needed for moderate pain.   aspirin 81 MG chewable tablet Chew 81 mg by mouth every morning.   Bilberry 1000 MG Caps Take 1,000 mg by mouth every morning.   CALCIUM 600/VITAMIN D PO Take 600 mg by mouth 2 (two) times daily.   cholecalciferol 400 units Tabs tablet Commonly known as:  VITAMIN D Take 400 Units by mouth every morning.   ciprofloxacin 500 MG tablet Commonly known as:  CIPRO Take 1 tablet (500 mg total) by mouth 2 (two) times daily.   CRANBERRY EXTRACT PO Take 1 tablet by mouth every morning.   CULTURELLE PRO-WELL PO Take by mouth. Take one probiotic tablet in morning   FIBERCON PO Take 1 tablet by mouth 2 (two) times daily.   fish oil-omega-3 fatty acids 1000 MG capsule Take 1 g by mouth every morning.   FLUZONE HIGH-DOSE 0.5 ML Susy Generic drug:  Influenza Vac Split High-Dose ADMINISTER 0.5ML IN THE MUSCLE AS DIRECTED   GLUCOSAMINE CHONDR COMPLEX PO Take 1 tablet by mouth 2 (two) times daily. for joints   ibuprofen 200 MG tablet Commonly known as:  ADVIL,MOTRIN Take 200 mg by mouth. Take 2 tablets as needed for inflammatory pain   meclizine 25 MG tablet Commonly known as:  ANTIVERT Take 1 tablet (25 mg total) by mouth 3 (three) times daily as needed for dizziness.   nystatin powder Commonly known as:  MYCOSTATIN/NYSTOP Apply to affected skin daily to treat yeast   PRESERVISION/LUTEIN Caps Take 1 capsule by mouth 2 (two) times daily.   tamoxifen 20 MG tablet Commonly known as:  NOLVADEX Take 1 tablet (20 mg total) by mouth daily.   trimethoprim 100 MG tablet Commonly known as:  TRIMPEX Take 100 mg by mouth at bedtime.   zolpidem 5 MG tablet Commonly known as:  AMBIEN Take one half tablet at bedtime if needed for rest        PHYSICAL EXAMINATION  Oncology Vitals 02/04/2016 01/27/2016  Height 155 cm 155 cm  Weight 64.365 kg 64.864 kg  Weight (lbs) 141  lbs 14 oz 143 lbs  BMI (kg/m2) 26.81 kg/m2 27.02 kg/m2  Temp 98.1 97.2  Pulse 77 62  Resp 18 -  SpO2 100 97  BSA (m2) 1.66 m2 1.67 m2   BP Readings from Last 2 Encounters:  02/04/16 (!) 206/78  01/27/16 138/64    Physical Exam  Constitutional: She is oriented to person, place, and time and well-developed, well-nourished, and in no distress.  HENT:  Head: Normocephalic and atraumatic.  Eyes: Conjunctivae and EOM are normal. Pupils are equal, round, and reactive to light.  Neck: Normal range of motion.  Pulmonary/Chest: Effort normal. No respiratory distress.  Musculoskeletal: Normal range of motion. She exhibits edema. She exhibits no tenderness.  +1 edema to bilateral lower extremities; with the right slightly larger than the left.  No Tenderness.  Neurological: She is alert and oriented to person, place, and time.  Walks with a walker  Skin: Skin is warm and dry.  Psychiatric: Affect normal.  Nursing note and vitals reviewed.   LABORATORY DATA:. No visits with results within 3 Day(s) from this visit.  Latest known visit with results is:  Appointment on  01/20/2016  Component Date Value Ref Range Status  . WBC 01/20/2016 8.5  3.9 - 10.3 10e3/uL Final  . NEUT# 01/20/2016 4.4  1.5 - 6.5 10e3/uL Final  . HGB 01/20/2016 11.3* 11.6 - 15.9 g/dL Final  . HCT 01/20/2016 34.1* 34.8 - 46.6 % Final  . Platelets 01/20/2016 207  145 - 400 10e3/uL Final  . MCV 01/20/2016 93.2  79.5 - 101.0 fL Final  . MCH 01/20/2016 30.9  25.1 - 34.0 pg Final  . MCHC 01/20/2016 33.1  31.5 - 36.0 g/dL Final  . RBC 01/20/2016 3.66* 3.70 - 5.45 10e6/uL Final  . RDW 01/20/2016 15.1* 11.2 - 14.5 % Final  . lymph# 01/20/2016 2.6  0.9 - 3.3 10e3/uL Final  . MONO# 01/20/2016 1.0* 0.1 - 0.9 10e3/uL Final  . Eosinophils Absolute 01/20/2016 0.5  0.0 - 0.5 10e3/uL Final  . Basophils Absolute 01/20/2016 0.0  0.0 - 0.1 10e3/uL Final  . NEUT% 01/20/2016 51.7  38.4 - 76.8 % Final  . LYMPH% 01/20/2016 30.5  14.0 -  49.7 % Final  . MONO% 01/20/2016 12.0  0.0 - 14.0 % Final  . EOS% 01/20/2016 5.3  0.0 - 7.0 % Final  . BASO% 01/20/2016 0.5  0.0 - 2.0 % Final  . Sodium 01/20/2016 130* 136 - 145 mEq/L Final  . Potassium 01/20/2016 4.6  3.5 - 5.1 mEq/L Final  . Chloride 01/20/2016 97* 98 - 109 mEq/L Final  . CO2 01/20/2016 25  22 - 29 mEq/L Final  . Glucose 01/20/2016 78  70 - 140 mg/dl Final  . BUN 01/20/2016 20.9  7.0 - 26.0 mg/dL Final  . Creatinine 01/20/2016 0.8  0.6 - 1.1 mg/dL Final  . Total Bilirubin 01/20/2016 0.41  0.20 - 1.20 mg/dL Final  . Alkaline Phosphatase 01/20/2016 149  40 - 150 U/L Final  . AST 01/20/2016 31  5 - 34 U/L Final  . ALT 01/20/2016 21  0 - 55 U/L Final  . Total Protein 01/20/2016 6.6  6.4 - 8.3 g/dL Final  . Albumin 01/20/2016 3.6  3.5 - 5.0 g/dL Final  . Calcium 01/20/2016 9.9  8.4 - 10.4 mg/dL Final  . Anion Gap 01/20/2016 8  3 - 11 mEq/L Final  . EGFR 01/20/2016 61* >90 ml/min/1.73 m2 Final  . Extra Tube 01/20/2016 Sample held in lab for 7 days   Final  . Cancer Antigen (CA) 125 01/20/2016 59.8* 0.0 - 38.1 U/mL Final    RADIOGRAPHIC STUDIES: No results found.  ASSESSMENT/PLAN:    Peripheral edema Patient states that she has just noticed some peripheral edema to her bilateral lower extremities within the past 24 hours or so.  She denies any calf tenderness.  She also denies any chest pain, chest pressure, shortness of breath, or pain with inspiration.  She denies any recent fevers or chills.  She states that she started taking tamoxifen approximate 10-14 days ago; is concerned that tamoxifen may be causing the swelling.  Also, patient states that she has a problem with low sodium; suspicious sodium on a regular basis.  Exam today of bilateral lower extremities reveal +1 edema to bilateral lower legs; with the right slightly greater than the left.  Doppler ultrasound obtained today of the right leg was negative for DVT.  Reviewed all findings with Dr. Jana Hakim; he  recommended that patient elevate her legs above the level of her heart whenever at rest, remain as active as possible, consider using compression stockings; and also to follow-up with her primary  care provider regarding the peripheral edema issues.  Patient was advised to call/return of her directly to the emergency department for any worsening symptoms whatsoever.  Elevated blood pressure reading Patient's blood pressure on initial check today was 206/78.  Patient states that she does not have a history of hypertension; he does not take any blood pressure medication or fluid pills.  She states that she has what is called White coat syndrome; and her blood pressure is always elevated when she is at the doctor's office.  She states that she will recheck her blood pressure when she returns back home and alert.  Her family doctor if her blood pressure remains elevated.  Also, patient was advised to go directly to the emergency department for any worsening symptoms whatsoever.  Cancer of right breast, stage 1, estrogen receptor positive ([place]) Patient started taking the tamoxifen oral therapy on 01/20/2016.  She presented to the [facility] today with complaint of a 24 history of bilateral lower extremity edema.  She denies any other new symptoms whatsoever.  See further notes for details of today's visit.  There is no follow-up scheduled for the patient as of yet here at the cancer center.  Dr. Virgie Dad most recent note states that patient will obtain a PET scan for restaging purposes prior to returning for follow-up visit in approximately 3 months-which would mean that the patient would have a follow-up visit around 04/19/2016.   Patient stated understanding of all instructions; and was in agreement with this plan of care. The patient knows to call the clinic with any problems, questions or concerns.   Total time spent with patient was 25 minutes;  with greater than 75 percent of that time spent in  face to face counseling regarding patient's symptoms,  and coordination of care and follow up.  Disclaimer:This dictation was prepared with Dragon/digital dictation along with [employer]. Any transcriptional errors that result from this process are unintentional.  Drue Second, NP 02/04/2016

## 2016-02-04 NOTE — Telephone Encounter (Signed)
"  This is Wilford Grist calling for my mom.  Dr. Jana Hakim gave thumbs up to take Tamoxifen with instructions to call if any swelling.  She started this January 21, 2016.  Now 14 days in she noticed today her ankles are swollen.  Right is a little larger than the left.  Swelling actually goes up the legs.  Can't tell if it's warm to touch, her whole apartment is warm.  She's getting dressed so I can't tell, need to know what to do.  Should we elevate legs above the heart?"  Instructed to come today to [facility] for further evaluation.  Scheduling message sent.

## 2016-02-05 ENCOUNTER — Telehealth: Payer: Self-pay

## 2016-02-05 NOTE — Telephone Encounter (Signed)
Spoke with Cassandra Glenn, per Dr. Nyoka Cowden, will have Clarene Critchley take her BP tomorrow and call Dr. Nyoka Cowden with the results. If it is still high he may start her on blood pressure medication. Cassandra Glenn was ok with this. Dr. Nyoka Cowden wrote order for [facility].

## 2016-02-05 NOTE — Telephone Encounter (Signed)
Message left on clinical intake voicemail:   Patients daughter is awaiting a return call from 9 am this morning about elevated blood pressure.   I called patient's daughter and informed her that I do not see where she spoke with someone today and I would be glad to assist her. [name] states she spoke with Jackelyn Poling, [facility] assistant. Patient with B/P of 180/93 and needs recommendations.  I called Debbie via cellphone and she recalled speaking with patient's daughter and will follow-up with her today.  Please advise

## 2016-02-09 ENCOUNTER — Telehealth: Payer: Self-pay | Admitting: *Deleted

## 2016-02-09 NOTE — Telephone Encounter (Signed)
TCT patient to follow up on [facility] visit on 02/04/16. No answer and no available voice mail.

## 2016-02-11 ENCOUNTER — Encounter: Payer: Self-pay | Admitting: Podiatry

## 2016-02-11 ENCOUNTER — Other Ambulatory Visit: Payer: Self-pay | Admitting: *Deleted

## 2016-02-11 ENCOUNTER — Ambulatory Visit (INDEPENDENT_AMBULATORY_CARE_PROVIDER_SITE_OTHER): Payer: Medicare Other | Admitting: Podiatry

## 2016-02-11 ENCOUNTER — Ambulatory Visit (HOSPITAL_BASED_OUTPATIENT_CLINIC_OR_DEPARTMENT_OTHER): Payer: Medicare Other

## 2016-02-11 ENCOUNTER — Telehealth: Payer: Self-pay

## 2016-02-11 ENCOUNTER — Encounter: Payer: Self-pay | Admitting: Nurse Practitioner

## 2016-02-11 ENCOUNTER — Ambulatory Visit (HOSPITAL_COMMUNITY)
Admission: RE | Admit: 2016-02-11 | Discharge: 2016-02-11 | Disposition: A | Discharge status: Home / Self Care | Payer: Medicare Other | Source: Ambulatory Visit | Attending: Nurse Practitioner | Admitting: Nurse Practitioner

## 2016-02-11 ENCOUNTER — Ambulatory Visit (HOSPITAL_BASED_OUTPATIENT_CLINIC_OR_DEPARTMENT_OTHER): Payer: Medicare Other | Admitting: Nurse Practitioner

## 2016-02-11 VITALS — Ht 61.0 in | Wt 138.0 lb

## 2016-02-11 VITALS — BP 186/63 | HR 74 | Temp 97.7°F | Resp 16 | Ht 61.0 in | Wt 138.1 lb

## 2016-02-11 DIAGNOSIS — B351 Tinea unguium: Secondary | ICD-10-CM

## 2016-02-11 DIAGNOSIS — R609 Edema, unspecified: Secondary | ICD-10-CM

## 2016-02-11 DIAGNOSIS — S2242XA Multiple fractures of ribs, left side, initial encounter for closed fracture: Secondary | ICD-10-CM

## 2016-02-11 DIAGNOSIS — I700 Atherosclerosis of aorta: Secondary | ICD-10-CM | POA: Diagnosis not present

## 2016-02-11 DIAGNOSIS — S2249XA Multiple fractures of ribs, unspecified side, initial encounter for closed fracture: Secondary | ICD-10-CM | POA: Insufficient documentation

## 2016-02-11 DIAGNOSIS — X58XXXA Exposure to other specified factors, initial encounter: Secondary | ICD-10-CM | POA: Diagnosis not present

## 2016-02-11 DIAGNOSIS — C50911 Malignant neoplasm of unspecified site of right female breast: Secondary | ICD-10-CM | POA: Diagnosis not present

## 2016-02-11 DIAGNOSIS — E871 Hypo-osmolality and hyponatremia: Secondary | ICD-10-CM | POA: Diagnosis not present

## 2016-02-11 DIAGNOSIS — M79676 Pain in unspecified toe(s): Secondary | ICD-10-CM

## 2016-02-11 DIAGNOSIS — J90 Pleural effusion, not elsewhere classified: Secondary | ICD-10-CM | POA: Diagnosis not present

## 2016-02-11 DIAGNOSIS — R079 Chest pain, unspecified: Secondary | ICD-10-CM | POA: Diagnosis not present

## 2016-02-11 DIAGNOSIS — Z170 Estrogen receptor positive status [ER+]: Secondary | ICD-10-CM

## 2016-02-11 LAB — CBC WITH DIFFERENTIAL/PLATELET
BASO%: 0.9 % (ref 0.0–2.0)
Basophils Absolute: 0.1 10*3/uL (ref 0.0–0.1)
EOS ABS: 0.2 10*3/uL (ref 0.0–0.5)
EOS%: 1.8 % (ref 0.0–7.0)
HCT: 32.7 % — ABNORMAL LOW (ref 34.8–46.6)
HEMOGLOBIN: 10.9 g/dL — AB (ref 11.6–15.9)
LYMPH%: 31.1 % (ref 14.0–49.7)
MCH: 31.3 pg (ref 25.1–34.0)
MCHC: 33.4 g/dL (ref 31.5–36.0)
MCV: 93.7 fL (ref 79.5–101.0)
MONO#: 0.8 10*3/uL (ref 0.1–0.9)
MONO%: 8.9 % (ref 0.0–14.0)
NEUT%: 57.3 % (ref 38.4–76.8)
NEUTROS ABS: 5 10*3/uL (ref 1.5–6.5)
PLATELETS: 161 10*3/uL (ref 145–400)
RBC: 3.49 10*6/uL — AB (ref 3.70–5.45)
RDW: 15.1 % — AB (ref 11.2–14.5)
WBC: 8.7 10*3/uL (ref 3.9–10.3)
lymph#: 2.7 10*3/uL (ref 0.9–3.3)

## 2016-02-11 LAB — COMPREHENSIVE METABOLIC PANEL
ALBUMIN: 3.7 g/dL (ref 3.5–5.0)
ALK PHOS: 235 U/L — AB (ref 40–150)
ALT: 27 U/L (ref 0–55)
AST: 40 U/L — ABNORMAL HIGH (ref 5–34)
Anion Gap: 7 mEq/L (ref 3–11)
BILIRUBIN TOTAL: 0.5 mg/dL (ref 0.20–1.20)
BUN: 13.5 mg/dL (ref 7.0–26.0)
CO2: 26 meq/L (ref 22–29)
CREATININE: 0.8 mg/dL (ref 0.6–1.1)
Calcium: 9.4 mg/dL (ref 8.4–10.4)
Chloride: 97 mEq/L — ABNORMAL LOW (ref 98–109)
EGFR: 62 mL/min/{1.73_m2} — ABNORMAL LOW (ref >90)
GLUCOSE: 103 mg/dL (ref 70–140)
Potassium: 4.8 mEq/L (ref 3.5–5.1)
SODIUM: 130 meq/L — AB (ref 136–145)
TOTAL PROTEIN: 6.5 g/dL (ref 6.4–8.3)

## 2016-02-11 NOTE — Progress Notes (Signed)
SYMPTOM MANAGEMENT CLINIC    Chief Complaint: Rib pain  HPI:  Cassandra Glenn 83 y.o. female diagnosed with breast cancer with metastasis.  Currently undergoing tamoxifen oral therapy.    No history exists.    Review of Systems  Cardiovascular: Positive for leg swelling.       Left rib pain  All other systems reviewed and are negative.   Past Medical History:  Diagnosis Date  . Abnormalities of the hair 02/29/2012  . Arthritis   . Cancer ([place])   . Candidiasis of skin and nails 03/19/2011  . Closed fracture of base of neck of femur ([place]) 03/18/2011  . Contact dermatitis and other eczema, due to unspecified cause 05/07/2011  . Depression 07/23/2014  . Diverticulosis of colon (without mention of hemorrhage) 03/19/2011  . Edema 05/07/2011  . HOH (hard of hearing)   . Hypopotassemia 03/26/2011  . Hyposmolality and/or hyponatremia 03/19/2011  . Hypotension, unspecified 03/18/2011  . Impaired fasting glucose 03/19/2011  . Insomnia, unspecified 03/19/2011  . Kidney infection   . Lumbago 03/19/2011  . Macular degeneration   . Malignant neoplasm of breast (female), unspecified site 10/28/2002  . Osteoporosis, unspecified 03/19/2011  . Other malaise and fatigue 03/19/2011  . Pain in joint, pelvic region and thigh 03/19/2011  . Right bundle branch block 08/30/2011  . Shortness of breath   . Spasm of muscle 03/19/2011  . Spinal stenosis, lumbar region, with neurogenic claudication 11/02/2011  . Trigger finger (acquired) 11/02/2011  . Unspecified constipation 03/19/2011  . Vaginitis and vulvovaginitis 02/29/2012  . Vertigo 08/26/2015    Past Surgical History:  Procedure Laterality Date  . BREAST SURGERY Bilateral 2005-10/27/2004   lumpectomy- Streck,MD  . CATARACT EXTRACTION  2010   bialteral  . EYE SURGERY     cataract  . FRACTURE SURGERY Left 09/1980   ankle  . HARDWARE REMOVAL Right 05/28/2014   Procedure: HARDWARE REMOVAL;  Surgeon: Melrose Nakayama, MD;  Location:  [place];  Service: Orthopedics;  Laterality: Right;  . HIP PINNING,CANNULATED  03/08/2011   Procedure: CANNULATED HIP PINNING;  Surgeon: Johnn Hai, MD;  Location: [facility];  Service: Orthopedics;  Laterality: Right;  . ORIF HIP FRACTURE Right 03/08/2011   Bean,MD  . SKIN CANCER EXCISION Left 10/12/2012   lower leg Dr. [place]  . SKIN LESION EXCISION Right 02/04/2011   Abdomen lesion spongiotic dermatitis-Taffeen, MD  . SQUAMOUS CELL CARCINOMA EXCISION Right 02/04/2011   forearmSyble Creek, MD  . TONSILLECTOMY    . TOTAL HIP ARTHROPLASTY Right 05/28/2014   Procedure: TOTAL HIP ARTHROPLASTY ANTERIOR APPROACH;  Surgeon: Melrose Nakayama, MD;  Location: [place];  Service: Orthopedics;  Laterality: Right;    has Closed fracture of base of neck of femur ([place]); Cancer of right breast, stage 1, estrogen receptor positive ([place]); Compression fracture of thoracic vertebra ([place]); Insomnia; Urinary frequency; History of fall; Hyponatremia; Hyperglycemia; UTI (urinary tract infection); Abnormal CT scan, chest; Purpura senilis ([place]); TMJ click; Cancer antigen 125 (CA 125) elevation; IBS (irritable bowel syndrome); Primary osteoarthritis of right hip; Urinary retention; Constipation; Candidiasis of skin; Depression; Osteoporosis; Cellophane retinopathy; Chorioretinal scar, macular; AMD (age-related macular degeneration), wet ([place]); Nonexudative age-related macular degeneration; Posterior vitreous detachment; History of surgical procedure; Herpes simplex; Syncope; Lightheadedness; BPV (benign positional vertigo); Vertigo; Mass of right lung; Abnormal PET scan of mediastinum; Contusion; Metastasis to adrenal gland ([place]); Idiopathic scoliosis; Peripheral edema; Elevated blood pressure reading; and Rib fractures on her problem list.    has No Known Allergies.  Allergies  as of 02/11/2016   No Known Allergies     Medication List       Accurate as of 02/11/16  3:04 PM. Always use your most recent med list.            acetaminophen 500 MG tablet Commonly known as:  TYLENOL Take 1,000 mg by mouth every 6 (six) hours as needed for moderate pain.   aspirin 81 MG chewable tablet Chew 81 mg by mouth every morning.   Bilberry 1000 MG Caps Take 1,000 mg by mouth every morning.   CALCIUM 600/VITAMIN D PO Take 600 mg by mouth 2 (two) times daily.   cholecalciferol 400 units Tabs tablet Commonly known as:  VITAMIN D Take 400 Units by mouth every morning.   CRANBERRY EXTRACT PO Take 1 tablet by mouth every morning.   CULTURELLE PRO-WELL PO Take by mouth. Take one probiotic tablet in morning   FIBERCON PO Take 1 tablet by mouth 2 (two) times daily.   fish oil-omega-3 fatty acids 1000 MG capsule Take 1 g by mouth every morning.   FLUZONE HIGH-DOSE 0.5 ML Susy Generic drug:  Influenza Vac Split High-Dose ADMINISTER 0.5ML IN THE MUSCLE AS DIRECTED   GLUCOSAMINE CHONDR COMPLEX PO Take 1 tablet by mouth 2 (two) times daily. for joints   ibuprofen 200 MG tablet Commonly known as:  ADVIL,MOTRIN Take 200 mg by mouth. Take 2 tablets as needed for inflammatory pain   meclizine 25 MG tablet Commonly known as:  ANTIVERT Take 1 tablet (25 mg total) by mouth 3 (three) times daily as needed for dizziness.   nystatin powder Commonly known as:  MYCOSTATIN/NYSTOP Apply to affected skin daily to treat yeast   PRESERVISION/LUTEIN Caps Take 1 capsule by mouth 2 (two) times daily.   tamoxifen 20 MG tablet Commonly known as:  NOLVADEX Take 1 tablet (20 mg total) by mouth daily.   trimethoprim 100 MG tablet Commonly known as:  TRIMPEX Take 100 mg by mouth at bedtime.   zolpidem 5 MG tablet Commonly known as:  AMBIEN Take one half tablet at bedtime if needed for rest        PHYSICAL EXAMINATION  Oncology Vitals 02/11/2016 02/11/2016  Height 155 cm 155 cm  Weight 62.596 kg 62.642 kg  Weight (lbs) 138 lbs 138 lbs 2 oz  BMI (kg/m2) 26.07 kg/m2 26.09 kg/m2  Temp - 97.7  Pulse - 74  Resp - 16   SpO2 - 96  BSA (m2) 1.64 m2 1.64 m2   BP Readings from Last 2 Encounters:  02/11/16 (!) 186/63  02/04/16 (!) 206/78    Physical Exam  Constitutional: She is oriented to person, place, and time and well-developed, well-nourished, and in no distress.  HENT:  Head: Normocephalic and atraumatic.  Eyes: Conjunctivae and EOM are normal. Pupils are equal, round, and reactive to light.  Neck: Normal range of motion.  Pulmonary/Chest: Effort normal. No respiratory distress.  Musculoskeletal: Normal range of motion. She exhibits edema. She exhibits no tenderness.  +1 edema to bilateral lower extremities; with the right slightly larger than the left.  No Tenderness.  Previous Doppler ultrasound of the right leg last week was negative.  For DVT.  Neurological: She is alert and oriented to person, place, and time.  Walks with a walker  Skin: Skin is warm and dry.  Psychiatric: Affect normal.  Nursing note and vitals reviewed.   LABORATORY DATA:. Appointment on 02/11/2016  Component Date Value Ref Range Status  . WBC 02/11/2016  8.7  3.9 - 10.3 10e3/uL Final  . NEUT# 02/11/2016 5.0  1.5 - 6.5 10e3/uL Final  . HGB 02/11/2016 10.9* 11.6 - 15.9 g/dL Final  . HCT 02/11/2016 32.7* 34.8 - 46.6 % Final  . Platelets 02/11/2016 161  145 - 400 10e3/uL Final  . MCV 02/11/2016 93.7  79.5 - 101.0 fL Final  . MCH 02/11/2016 31.3  25.1 - 34.0 pg Final  . MCHC 02/11/2016 33.4  31.5 - 36.0 g/dL Final  . RBC 02/11/2016 3.49* 3.70 - 5.45 10e6/uL Final  . RDW 02/11/2016 15.1* 11.2 - 14.5 % Final  . lymph# 02/11/2016 2.7  0.9 - 3.3 10e3/uL Final  . MONO# 02/11/2016 0.8  0.1 - 0.9 10e3/uL Final  . Eosinophils Absolute 02/11/2016 0.2  0.0 - 0.5 10e3/uL Final  . Basophils Absolute 02/11/2016 0.1  0.0 - 0.1 10e3/uL Final  . NEUT% 02/11/2016 57.3  38.4 - 76.8 % Final  . LYMPH% 02/11/2016 31.1  14.0 - 49.7 % Final  . MONO% 02/11/2016 8.9  0.0 - 14.0 % Final  . EOS% 02/11/2016 1.8  0.0 - 7.0 % Final  . BASO%  02/11/2016 0.9  0.0 - 2.0 % Final  . Sodium 02/11/2016 130* 136 - 145 mEq/L Final  . Potassium 02/11/2016 4.8  3.5 - 5.1 mEq/L Final  . Chloride 02/11/2016 97* 98 - 109 mEq/L Final  . CO2 02/11/2016 26  22 - 29 mEq/L Final  . Glucose 02/11/2016 103  70 - 140 mg/dl Final  . BUN 02/11/2016 13.5  7.0 - 26.0 mg/dL Final  . Creatinine 02/11/2016 0.8  0.6 - 1.1 mg/dL Final  . Total Bilirubin 02/11/2016 0.50  0.20 - 1.20 mg/dL Final  . Alkaline Phosphatase 02/11/2016 235* 40 - 150 U/L Final  . AST 02/11/2016 40* 5 - 34 U/L Final  . ALT 02/11/2016 27  0 - 55 U/L Final  . Total Protein 02/11/2016 6.5  6.4 - 8.3 g/dL Final  . Albumin 02/11/2016 3.7  3.5 - 5.0 g/dL Final  . Calcium 02/11/2016 9.4  8.4 - 10.4 mg/dL Final  . Anion Gap 02/11/2016 7  3 - 11 mEq/L Final  . EGFR 02/11/2016 62* >90 ml/min/1.73 m2 Final    RADIOGRAPHIC STUDIES: Dg Chest 2 View  Result Date: 02/11/2016 CLINICAL DATA:  Breast cancer.  Left chest pain. EXAM: CHEST  2 VIEW COMPARISON:  10/22/2015 chest radiograph. FINDINGS: Stable cardiomediastinal silhouette with normal heart size and aortic atherosclerosis. No pneumothorax. Small right pleural effusion appears slightly increased. No left pleural effusion. No pulmonary edema. Stable biapical pleural-parenchymal scarring. Mild right basilar opacity, favor atelectasis. There are multiple nondisplaced lateral left sixth through ninth rib fractures, most of which appear new since 10/22/2015 and subacute given callus formation. IMPRESSION: 1. Multiple nondisplaced subacute lateral left sixth through ninth rib fractures, suspicious for pathologic fractures given the history of breast cancer . 2. Small right pleural effusion, slightly increased . 3. Mild right basilar lung opacity, favor atelectasis . 4. Aortic atherosclerosis. Electronically Signed   By: Ilona Sorrel M.D.   On: 02/11/2016 11:31    ASSESSMENT/PLAN:    Rib fractures Patient reported to the [facility] today with  complaint of some left rib pain that occasionally radiates to the left flank region.  She states that the pain has been mild, intermittent for the past few weeks; it became much worse overnight.  She states that she has some occasional tenderness when she takes a deep breath.  She does not feel short  of breath.  She states she has an occasional dry cough only.  She denies any recent fevers or chills.  Patient denies any actual chest pain, chest pressure, and denies any radiation of this discomfort.  She states the pain to the left rib area is worse with certain movements.  Chest xray today revealed:   IMPRESSION: 1. Multiple nondisplaced subacute lateral left sixth through ninth rib fractures, suspicious for pathologic fractures given the history of breast cancer . 2. Small right pleural effusion, slightly increased . 3. Mild right basilar lung opacity, favor atelectasis . 4. Aortic atherosclerosis.  Exam today revealed no obvious tenderness or obvious injury or trauma to the left ribs with her left flank area.  Also, no rash noted.  Lung sounds are essentially clear.  Patient was noted have an occasional dry cough only.  She had no shortness of breath on exam and no distress whatsoever.  Vital signs were essentially stable; with the exception of an elevated blood pressure.  O2 sat was 96% on room air.  Patient was afebrile.  With further discussion with both the patient and her daughter-daughter remembers that patient was found to have some fractured fifth and sixth left ribs in early October.  Prior to her cancer diagnosis.  At that time-patient has had no known injury or trauma/falls.  Also, with further review of the October 2017 scans-it was noted that patient had some healing left rib fractures at that time as well.  Patient once again denies any falls or known injuries recently.  The chest x-ray does note that these are probably old pathological fractures.  Long discussion with both patient  and her daughter today regarding the possibility that these rib fractures were from an old injury; but could very well be pathological fractures secondary to her cancer.  Reviewed all findings with Dr. Jana Hakim today; and he has recommended that patient obtain her restaging PET scan as well as a bone scan prior to her labs in follow-up visit with Dr. Jana Hakim in March.  Patient was also advised to call/return or go directly to the emergency department for any worsening symptoms whatsoever.   Peripheral edema Patient states that she has just noticed some peripheral edema to her bilateral lower extremities within the past 24 hours or so.  She denies any calf tenderness.  She also denies any chest pain, chest pressure, shortness of breath, or pain with inspiration.  She denies any recent fevers or chills.  She states that she started taking tamoxifen approximate 10-14 days ago; is concerned that tamoxifen may be causing the swelling.  Also, patient states that she has a problem with low sodium; suspicious sodium on a regular basis.  Exam today of bilateral lower extremities reveal +1 edema to bilateral lower legs; with the right slightly greater than the left.  Doppler ultrasound obtained today of the right leg was negative for DVT.  Reviewed all findings with Dr. Jana Hakim; he recommended that patient elevate her legs above the level of her heart whenever at rest, remain as active as possible, consider using compression stockings; and also to follow-up with her primary care provider regarding the peripheral edema issues.  Patient was advised to call/return of her directly to the emergency department for any worsening symptoms whatsoever. _________________________________  Update: Patient continues with only trace edema to her bilateral lower extremities.  She stated that she plans to purchase some over-the-counter compression stockings to help with her edema.  Hyponatremia Patient appears to have  some chronic  issues with hyponatremia.  Sodium today is 130.  Patient states that she tries to drink Gatorade which has sodium minute; but also has to be aware of chronic issues with edema to her ankles as well.  We'll continue to monitor closely.  Cancer of right breast, stage 1, estrogen receptor positive ([place]) Patient has been taking tamoxifen oral therapy as directed for approximately 3 weeks now.  Patient will need to be scheduled for her PET scan as well as a bone scan in early March 2018.  She will then need labs and a follow-up visit with Dr. Jana Hakim a few days after her scans are done.   Patient stated understanding of all instructions; and was in agreement with this plan of care. The patient knows to call the clinic with any problems, questions or concerns.   Total time spent with patient was 25 minutes;  with greater than 75 percent of that time spent in face to face counseling regarding patient's symptoms,  and coordination of care and follow up.  Disclaimer:This dictation was prepared with Dragon/digital dictation along with [employer]. Any transcriptional errors that result from this process are unintentional.  Drue Second, NP 02/11/2016

## 2016-02-11 NOTE — Assessment & Plan Note (Signed)
Patient appears to have some chronic issues with hyponatremia.  Sodium today is 130.  Patient states that she tries to drink Gatorade which has sodium minute; but also has to be aware of chronic issues with edema to her ankles as well.  We'll continue to monitor closely.

## 2016-02-11 NOTE — Assessment & Plan Note (Signed)
Patient states that she has just noticed some peripheral edema to her bilateral lower extremities within the past 24 hours or so.  She denies any calf tenderness.  She also denies any chest pain, chest pressure, shortness of breath, or pain with inspiration.  She denies any recent fevers or chills.  She states that she started taking tamoxifen approximate 10-14 days ago; is concerned that tamoxifen may be causing the swelling.  Also, patient states that she has a problem with low sodium; suspicious sodium on a regular basis.  Exam today of bilateral lower extremities reveal +1 edema to bilateral lower legs; with the right slightly greater than the left.  Doppler ultrasound obtained today of the right leg was negative for DVT.  Reviewed all findings with Dr. Jana Hakim; he recommended that patient elevate her legs above the level of her heart whenever at rest, remain as active as possible, consider using compression stockings; and also to follow-up with her primary care provider regarding the peripheral edema issues.  Patient was advised to call/return of her directly to the emergency department for any worsening symptoms whatsoever. _________________________________  Update: Patient continues with only trace edema to her bilateral lower extremities.  She stated that she plans to purchase some over-the-counter compression stockings to help with her edema.

## 2016-02-11 NOTE — Assessment & Plan Note (Signed)
Patient has been taking tamoxifen oral therapy as directed for approximately 3 weeks now.  Patient will need to be scheduled for her PET scan as well as a bone scan in early March 2018.  She will then need labs and a follow-up visit with Dr. Jana Hakim a few days after her scans are done.

## 2016-02-11 NOTE — Progress Notes (Signed)
Complaint:  Visit Type: Patient returns to my office for continued preventative foot care services. Complaint: Patient states" my nails have grown long and thick and become painful to walk and wear shoes" Patient has been diagnosed with DM with no foot complications. The patient presents for preventative foot care services. No changes to ROS  Podiatric Exam: Vascular: dorsalis pedis and posterior tibial pulses are palpable bilateral. Capillary return is immediate. Temperature gradient is WNL. Skin turgor WNL  Sensorium: Normal Semmes Weinstein monofilament test. Normal tactile sensation bilaterally. Nail Exam: Pt has thick disfigured discolored nails with subungual debris noted bilateral entire nail hallux through fifth toenails Ulcer Exam: There is no evidence of ulcer or pre-ulcerative changes or infection. Orthopedic Exam: Muscle tone and strength are WNL. No limitations in general ROM. No crepitus or effusions noted. Foot type and digits show no abnormalities. Bony prominences are unremarkable. Skin: No Porokeratosis. No infection or ulcers  Diagnosis:  Onychomycosis, , Pain in right toe, pain in left toes. Pincer toenails  Treatment & Plan Procedures and Treatment: Consent by patient was obtained for treatment procedures. The patient understood the discussion of treatment and procedures well. All questions were answered thoroughly reviewed. Debridement of mycotic and hypertrophic toenails, 1 through 5 bilateral and clearing of subungual debris. No ulceration, no infection noted.  Return Visit-Office Procedure: Patient instructed to return to the office for a follow up visit 3 months for continued evaluation and treatment.    Roby Donaway DPM 

## 2016-02-11 NOTE — Telephone Encounter (Signed)
Daughter called with pt's c/o pain under her L breast. Pt called daughter this AM and the nurse at [facility]. She took several baby aspirin in the night for the pain. Nurse checked BP and said "a little elevated".  Pt is at independent living at [facility] pt: pain started last night, on rib cage under left breast. Dull ache, 8/10. Pain in back when breath under shoulder blade. Pain under breast worsens with deep breath. No heart racing, no nausea, no increased SOB.   S/w [facility] and having [name] bring pt to [facility] for 2v CXR then labs and [facility].

## 2016-02-11 NOTE — Assessment & Plan Note (Signed)
Patient reported to the [facility] today with complaint of some left rib pain that occasionally radiates to the left flank region.  She states that the pain has been mild, intermittent for the past few weeks; it became much worse overnight.  She states that she has some occasional tenderness when she takes a deep breath.  She does not feel short of breath.  She states she has an occasional dry cough only.  She denies any recent fevers or chills.  Patient denies any actual chest pain, chest pressure, and denies any radiation of this discomfort.  She states the pain to the left rib area is worse with certain movements.  Chest xray today revealed:   IMPRESSION: 1. Multiple nondisplaced subacute lateral left sixth through ninth rib fractures, suspicious for pathologic fractures given the history of breast cancer . 2. Small right pleural effusion, slightly increased . 3. Mild right basilar lung opacity, favor atelectasis . 4. Aortic atherosclerosis.  Exam today revealed no obvious tenderness or obvious injury or trauma to the left ribs with her left flank area.  Also, no rash noted.  Lung sounds are essentially clear.  Patient was noted have an occasional dry cough only.  She had no shortness of breath on exam and no distress whatsoever.  Vital signs were essentially stable; with the exception of an elevated blood pressure.  O2 sat was 96% on room air.  Patient was afebrile.  With further discussion with both the patient and her daughter-daughter remembers that patient was found to have some fractured fifth and sixth left ribs in early October.  Prior to her cancer diagnosis.  At that time-patient has had no known injury or trauma/falls.  Also, with further review of the October 2017 scans-it was noted that patient had some healing left rib fractures at that time as well.  Patient once again denies any falls or known injuries recently.  The chest x-ray does note that these are probably old  pathological fractures.  Long discussion with both patient and her daughter today regarding the possibility that these rib fractures were from an old injury; but could very well be pathological fractures secondary to her cancer.  Reviewed all findings with Dr. Jana Hakim today; and he has recommended that patient obtain her restaging PET scan as well as a bone scan prior to her labs in follow-up visit with Dr. Jana Hakim in March.  Patient was also advised to call/return or go directly to the emergency department for any worsening symptoms whatsoever.

## 2016-02-16 DIAGNOSIS — N390 Urinary tract infection, site not specified: Secondary | ICD-10-CM | POA: Diagnosis not present

## 2016-02-18 ENCOUNTER — Encounter (HOSPITAL_COMMUNITY): Payer: Self-pay

## 2016-02-18 ENCOUNTER — Emergency Department (HOSPITAL_COMMUNITY): Payer: Medicare Other

## 2016-02-18 ENCOUNTER — Telehealth: Payer: Self-pay | Admitting: Nurse Practitioner

## 2016-02-18 ENCOUNTER — Telehealth: Payer: Self-pay

## 2016-02-18 ENCOUNTER — Emergency Department (HOSPITAL_COMMUNITY)
Admission: EM | Admit: 2016-02-18 | Discharge: 2016-02-18 | Disposition: A | Discharge status: Home / Self Care | Payer: Medicare Other | Attending: Emergency Medicine | Admitting: Emergency Medicine

## 2016-02-18 DIAGNOSIS — Z96641 Presence of right artificial hip joint: Secondary | ICD-10-CM | POA: Insufficient documentation

## 2016-02-18 DIAGNOSIS — R42 Dizziness and giddiness: Secondary | ICD-10-CM | POA: Diagnosis not present

## 2016-02-18 DIAGNOSIS — N1330 Unspecified hydronephrosis: Secondary | ICD-10-CM | POA: Diagnosis not present

## 2016-02-18 DIAGNOSIS — Z79899 Other long term (current) drug therapy: Secondary | ICD-10-CM | POA: Insufficient documentation

## 2016-02-18 DIAGNOSIS — Z7982 Long term (current) use of aspirin: Secondary | ICD-10-CM | POA: Diagnosis not present

## 2016-02-18 DIAGNOSIS — M545 Low back pain, unspecified: Secondary | ICD-10-CM

## 2016-02-18 DIAGNOSIS — R109 Unspecified abdominal pain: Secondary | ICD-10-CM | POA: Diagnosis present

## 2016-02-18 DIAGNOSIS — R030 Elevated blood-pressure reading, without diagnosis of hypertension: Secondary | ICD-10-CM | POA: Diagnosis not present

## 2016-02-18 DIAGNOSIS — I10 Essential (primary) hypertension: Secondary | ICD-10-CM | POA: Diagnosis not present

## 2016-02-18 DIAGNOSIS — N3000 Acute cystitis without hematuria: Secondary | ICD-10-CM | POA: Diagnosis not present

## 2016-02-18 DIAGNOSIS — Z859 Personal history of malignant neoplasm, unspecified: Secondary | ICD-10-CM | POA: Diagnosis not present

## 2016-02-18 HISTORY — DX: Fracture of one rib, unspecified side, initial encounter for closed fracture: S22.39XA

## 2016-02-18 HISTORY — DX: Multiple fractures of ribs, unspecified side, initial encounter for closed fracture: S22.49XA

## 2016-02-18 LAB — BASIC METABOLIC PANEL
Anion gap: 8 (ref 5–15)
BUN: 13 mg/dL (ref 6–20)
CHLORIDE: 98 mmol/L — AB (ref 101–111)
CO2: 23 mmol/L (ref 22–32)
CREATININE: 0.71 mg/dL (ref 0.44–1.00)
Calcium: 8.9 mg/dL (ref 8.9–10.3)
Glucose, Bld: 108 mg/dL — ABNORMAL HIGH (ref 65–99)
POTASSIUM: 4.2 mmol/L (ref 3.5–5.1)
SODIUM: 129 mmol/L — AB (ref 135–145)

## 2016-02-18 LAB — I-STAT TROPONIN, ED: TROPONIN I, POC: 0.01 ng/mL (ref 0.00–0.08)

## 2016-02-18 LAB — URINALYSIS, ROUTINE W REFLEX MICROSCOPIC
BILIRUBIN URINE: NEGATIVE
Glucose, UA: NEGATIVE mg/dL
Hgb urine dipstick: NEGATIVE
Ketones, ur: NEGATIVE mg/dL
NITRITE: NEGATIVE
PH: 6 (ref 5.0–8.0)
Protein, ur: NEGATIVE mg/dL
SPECIFIC GRAVITY, URINE: 1.005 (ref 1.005–1.030)

## 2016-02-18 LAB — CBC
HCT: 33.9 % — ABNORMAL LOW (ref 36.0–46.0)
HEMOGLOBIN: 11.2 g/dL — AB (ref 12.0–15.0)
MCH: 30.8 pg (ref 26.0–34.0)
MCHC: 33 g/dL (ref 30.0–36.0)
MCV: 93.1 fL (ref 78.0–100.0)
PLATELETS: 156 10*3/uL (ref 150–400)
RBC: 3.64 MIL/uL — AB (ref 3.87–5.11)
RDW: 15.1 % (ref 11.5–15.5)
WBC: 7.3 10*3/uL (ref 4.0–10.5)

## 2016-02-18 MED ORDER — SODIUM CHLORIDE 0.9 % IJ SOLN
INTRAMUSCULAR | Status: AC
  Filled 2016-02-18: qty 50

## 2016-02-18 MED ORDER — IOPAMIDOL (ISOVUE-370) INJECTION 76%
INTRAVENOUS | Status: AC
  Filled 2016-02-18: qty 100

## 2016-02-18 MED ORDER — IOPAMIDOL (ISOVUE-370) INJECTION 76%
100.0000 mL | Freq: Once | INTRAVENOUS | Status: AC | PRN
  Administered 2016-02-18: 100 mL via INTRAVENOUS

## 2016-02-18 NOTE — ED Provider Notes (Signed)
[facility] DEPT Provider Note   CSN: [id number] Arrival date & time: 02/18/16  1105     History   Chief Complaint Chief Complaint  Patient presents with  . Dizziness  . Back Pain  . Rib Fracture    DX LAST WEEK RIBS 6-9/ NO FALL    HPI Cassandra Glenn is a 83 y.o. female.  HPI 83 y.o. female with a hx of Cancer Right Breast Stage 1, Estrogen Receptor Positive. Started Tamoxifen on 01-20-16, presents to the Emergency Department today complaining of left rib pain this AM. Pt called oncology team today due increased left flank pain this morning. Recently diagnosed with left 6 through 9 rib fractures. No falls noted or documented. Concern for pathologic fractures due to malignancy. Recommended CT evaluation with Chest/Abdomen/Pelvis in ED. Currently pt without symptoms. No CP/SOB/ABD pain. No fevers. No dysuria. No N/V/D. No diaphoresis. Mild pain currently on posterior left rib cage. No other symptoms noted.   Past Medical History:  Diagnosis Date  . Abnormalities of the hair 02/29/2012  . Arthritis   . Cancer ([place])   . Candidiasis of skin and nails 03/19/2011  . Closed fracture of base of neck of femur ([place]) 03/18/2011  . Contact dermatitis and other eczema, due to unspecified cause 05/07/2011  . Depression 07/23/2014  . Diverticulosis of colon (without mention of hemorrhage) 03/19/2011  . Edema 05/07/2011  . HOH (hard of hearing)   . Hypopotassemia 03/26/2011  . Hyposmolality and/or hyponatremia 03/19/2011  . Hypotension, unspecified 03/18/2011  . Impaired fasting glucose 03/19/2011  . Insomnia, unspecified 03/19/2011  . Kidney infection   . Lumbago 03/19/2011  . Macular degeneration   . Malignant neoplasm of breast (female), unspecified site 10/28/2002  . Osteoporosis, unspecified 03/19/2011  . Other malaise and fatigue 03/19/2011  . Pain in joint, pelvic region and thigh 03/19/2011  . Rib fractures   . Right bundle branch block 08/30/2011  . Shortness of breath   .  Spasm of muscle 03/19/2011  . Spinal stenosis, lumbar region, with neurogenic claudication 11/02/2011  . Trigger finger (acquired) 11/02/2011  . Unspecified constipation 03/19/2011  . Vaginitis and vulvovaginitis 02/29/2012  . Vertigo 08/26/2015    Patient Active Problem List   Diagnosis Date Noted  . Rib fractures 02/11/2016  . Peripheral edema 02/04/2016  . Elevated blood pressure reading 02/04/2016  . Idiopathic scoliosis 01/27/2016  . Metastasis to adrenal gland ([place]) 11/21/2015  . Abnormal PET scan of mediastinum 11/11/2015  . Contusion 11/11/2015  . Mass of right lung 10/23/2015  . Vertigo 08/26/2015  . BPV (benign positional vertigo)   . Syncope 08/24/2015  . Lightheadedness   . Herpes simplex 07/29/2015  . Chorioretinal scar, macular 01/23/2015  . Osteoporosis 08/06/2014  . Depression 07/23/2014  . Candidiasis of skin 06/25/2014  . Constipation 06/04/2014  . Urinary retention 05/30/2014  . Primary osteoarthritis of right hip 05/28/2014  . Cancer antigen 125 (CA 125) elevation 11/27/2013  . IBS (irritable bowel syndrome) 11/27/2013  . Purpura senilis ([place]) 07/31/2013  . TMJ click [id number]  . Insomnia 04/24/2013  . Urinary frequency 04/24/2013  . Hyponatremia 04/19/2013  . Hyperglycemia 04/19/2013  . History of fall 04/17/2013  . UTI (urinary tract infection) 04/16/2013  . Abnormal CT scan, chest 03/23/2013  . Compression fracture of thoracic vertebra ([facility]) 11/24/2012  . AMD (age-related macular degeneration), wet ([place]) 05/27/2011  . History of surgical procedure 05/27/2011  . Closed fracture of base of neck of femur ([place]) 03/18/2011  . 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 Nonexudative age-related macular degeneration 12/10/2010  . Posterior vitreous detachment 12/10/2010  . Cancer of right breast, stage 1, estrogen receptor positive ([place]) 10/27/2004    Past Surgical History:  Procedure Laterality Date  . BREAST SURGERY Bilateral 2005-10/27/2004    lumpectomy- Streck,MD  . CATARACT EXTRACTION  2010   bialteral  . EYE SURGERY     cataract  . FRACTURE SURGERY Left 09/1980   ankle  . HARDWARE REMOVAL Right 05/28/2014   Procedure: HARDWARE REMOVAL;  Surgeon: Melrose Nakayama, MD;  Location: [facility];  Service: Orthopedics;  Laterality: Right;  . HIP PINNING,CANNULATED  03/08/2011   Procedure: CANNULATED HIP PINNING;  Surgeon: Johnn Hai, MD;  Location: [facility];  Service: Orthopedics;  Laterality: Right;  . ORIF HIP FRACTURE Right 03/08/2011   Bean,MD  . SKIN CANCER EXCISION Left 10/12/2012   lower leg Dr. [place]  . SKIN LESION EXCISION Right 02/04/2011   Abdomen lesion spongiotic dermatitis-Taffeen, MD  . SQUAMOUS CELL CARCINOMA EXCISION Right 02/04/2011   forearmSyble Creek, MD  . TONSILLECTOMY    . TOTAL HIP ARTHROPLASTY Right 05/28/2014   Procedure: TOTAL HIP ARTHROPLASTY ANTERIOR APPROACH;  Surgeon: Melrose Nakayama, MD;  Location: [facility];  Service: Orthopedics;  Laterality: Right;    OB History    No data available       Home Medications    Prior to Admission medications   Medication Sig Start Date End Date Taking? Authorizing Provider  acetaminophen (TYLENOL) 500 MG tablet Take 1,000 mg by mouth every 6 (six) hours as needed for moderate pain.     Historical Provider, MD  aspirin 81 MG chewable tablet Chew 81 mg by mouth every morning.     Historical Provider, MD  Bilberry 1000 MG CAPS Take 1,000 mg by mouth every morning.     Historical Provider, MD  Calcium Carbonate-Vitamin D (CALCIUM 600/VITAMIN D PO) Take 600 mg by mouth 2 (two) times daily.     Historical Provider, MD  Calcium Polycarbophil (FIBERCON PO) Take 1 tablet by mouth 2 (two) times daily.     Historical Provider, MD  cholecalciferol (VITAMIN D) 400 UNITS TABS Take 400 Units by mouth every morning.     Historical Provider, MD  CRANBERRY EXTRACT PO Take 1 tablet by mouth every morning.     Historical Provider, MD  fish oil-omega-3 fatty acids 1000 MG capsule Take 1  g by mouth every morning.     Historical Provider, MD  FLUZONE HIGH-DOSE 0.5 ML SUSY ADMINISTER 0.5ML IN THE MUSCLE AS DIRECTED 12/12/15   Historical Provider, MD  Glucosamine-Chondroitin (GLUCOSAMINE CHONDR COMPLEX PO) Take 1 tablet by mouth 2 (two) times daily. for joints    Historical Provider, MD  ibuprofen (ADVIL,MOTRIN) 200 MG tablet Take 200 mg by mouth. Take 2 tablets as needed for inflammatory pain    Historical Provider, MD  meclizine (ANTIVERT) 25 MG tablet Take 1 tablet (25 mg total) by mouth 3 (three) times daily as needed for dizziness. Patient not taking: Reported on 02/04/2016 08/25/15   Thurnell Lose, MD  Multiple Vitamins-Minerals (PRESERVISION/LUTEIN) CAPS Take 1 capsule by mouth 2 (two) times daily.    Historical Provider, MD  nystatin (MYCOSTATIN/NYSTOP) powder Apply to affected skin daily to treat yeast 10/29/15   Estill Dooms, MD  Probiotic Product (CULTURELLE PRO-WELL PO) Take by mouth. Take one probiotic tablet in morning    Historical Provider, MD  tamoxifen (NOLVADEX) 20 MG tablet Take 1 tablet (20 mg total) by mouth  daily. 01/20/16 02/19/16  Chauncey Cruel, MD  trimethoprim (TRIMPEX) 100 MG tablet Take 100 mg by mouth at bedtime. 10/02/15   Historical Provider, MD  zolpidem (AMBIEN) 5 MG tablet Take one half tablet at bedtime if needed for rest 07/29/15   Estill Dooms, MD    Family History Family History  Problem Relation Age of Onset  . Heart disease Mother   . Cancer Father   . Emphysema Brother   . Alzheimer's disease Brother     Social History Social History  Substance Use Topics  . Smoking status: Never Smoker  . Smokeless tobacco: Never Used  . Alcohol use 0.0 oz/week     Comment: occasional glass     Allergies   Patient has no known allergies.   Review of Systems Review of Systems ROS reviewed and all are negative for acute change except as noted in the HPI.  Physical Exam Updated Vital Signs SpO2 98%   Physical Exam  Constitutional:  She is oriented to person, place, and time. Vital signs are normal. She appears well-developed and well-nourished.  HENT:  Head: Normocephalic and atraumatic.  Right Ear: Hearing normal.  Left Ear: Hearing normal.  Eyes: Conjunctivae and EOM are normal. Pupils are equal, round, and reactive to light.  Neck: Normal range of motion. Neck supple.  Cardiovascular: Normal rate, regular rhythm, normal heart sounds and intact distal pulses.   No murmur heard. Pulmonary/Chest: Effort normal and breath sounds normal. No accessory muscle usage. No respiratory distress.  Abdominal: Soft. Bowel sounds are normal. There is no tenderness. There is no rigidity, no rebound, no guarding, no CVA tenderness, no tenderness at McBurney's point and negative Murphy's sign.  Musculoskeletal: Normal range of motion.  TTP posterior left ribs next to lumbar spine. No midline tenderness. No palpable or visible deformities. No CVA tenderness   Neurological: She is alert and oriented to person, place, and time.  Skin: Skin is warm and dry.  Psychiatric: She has a normal mood and affect. Her speech is normal and behavior is normal. Thought content normal.  Nursing note and vitals reviewed.  ED Treatments / Results  Labs (all labs ordered are listed, but only abnormal results are displayed) Labs Reviewed  CBC - Abnormal; Notable for the following:       Result Value   RBC 3.64 (*)    Hemoglobin 11.2 (*)    HCT 33.9 (*)    All other components within normal limits  BASIC METABOLIC PANEL - Abnormal; Notable for the following:    Sodium 129 (*)    Chloride 98 (*)    Glucose, Bld 108 (*)    All other components within normal limits  URINALYSIS, ROUTINE W REFLEX MICROSCOPIC - Abnormal; Notable for the following:    Color, Urine STRAW (*)    Leukocytes, UA TRACE (*)    Bacteria, UA RARE (*)    Squamous Epithelial / LPF 0-5 (*)    All other components within normal limits  I-STAT TROPOININ, ED    EKG  EKG  Interpretation None      Radiology Ct Angio Chest Pe W And/or Wo Contrast  Result Date: 02/18/2016 CLINICAL DATA:  Dizziness onset 10 onset last evening without resolution. Hypertension. Diagnosed with rib fractures last week. Back pain. EXAM: CT ANGIOGRAPHY CHEST CT ABDOMEN AND PELVIS WITH CONTRAST TECHNIQUE: Multidetector CT imaging of the chest was performed using the standard protocol during bolus administration of intravenous contrast. Multiplanar CT image reconstructions and MIPs  were obtained to evaluate the vascular anatomy. Multidetector CT imaging of the abdomen and pelvis was performed using the standard protocol during bolus administration of intravenous contrast. CONTRAST:  100 cc Isovue 370 IV COMPARISON:  Chest CT 10/27/2015, CT abdomen and pelvis 10/28/2015. FINDINGS: CTA CHEST FINDINGS Cardiovascular: Conventional branch pattern for the great vessels. Aortic atherosclerosis without dissection or aneurysm. No acute pulmonary embolus. Top normal size cardiac chambers without pericardial effusion or thickening. Mild coronary arteriosclerosis is noted. Mediastinum/Nodes: 1 cm short axis right lower paratracheal lymph node. No supraclavicular lymphadenopathy. No known thyroid nodules. Right axillary lymph node dissection clips are present. No axillary lymphadenopathy. Esophagus is unremarkable apart from some residual in the distal esophagus. Lungs/Pleura: Chronic biapical pleuroparenchymal areas of scarring and consolidation with bronchiectasis in the right upper lobe. Interval development of moderate sized right pleural effusion with adjacent compressive atelectasis. Nodular pleural-based thickening with adjacent parenchymal nodularity in the right middle lobe and right lower lobe, series 9, image 67 and 73 is identified possibly postinflammatory or postinfectious in etiology though neoplastic change is not entirely excluded. Right middle lobe subpleural nodular density measures up to 8 mm and  in the right lower lobe 13 mm. Musculoskeletal: Stable T11 compression deformity. Diffuse patchy osteopenic appearance of bony thorax. Review of the MIP images confirms the above findings. CT ABDOMEN and PELVIS FINDINGS Hepatobiliary: Tiny left hepatic granuloma. No biliary dilatation or space-occupying mass. Gallbladder is unremarkable. Pancreas: Atrophic appearance of the pancreas without inflammation. No ductal dilatation or mass. Spleen: No splenomegaly. Slight heterogeneous enhancement may be due to timing of contrast. Adrenals/Urinary Tract: Chronically thickened appearance of the adrenal glands left-greater-than-right. Mild proximal hydroureteronephrosis without obstructing source. Small parapelvic renal cysts are seen bilaterally. Mild enhancement of the urothelium of the proximal ureters which be secondary to a urinary tract infection. No evidence of pyelonephritis. No renal mass. Urinary bladder is physiologically distended. There is probable mild pelvic floor relaxation. Stomach/Bowel: Contracted stomach. No acute bowel inflammation or obstruction. Appendix not identified but no pericecal inflammation is apparent. Vascular/Lymphatic: Aortic atherosclerosis without evidence of aneurysm or dissection. No lymphadenopathy in the abdomen and pelvis. Reproductive: Atrophic uterus and ovaries.  No adnexal mass. Other: Trace free fluid in the pelvis likely physiologic. No pneumoperitoneum. Musculoskeletal: Osteopenic appearance of the axial and appendicular skeleton with dextroscoliosis of the lumbar spine. Marked compression deformity T11 with loss of approximately 80-90% of the height is unchanged. Status post right hip arthroplasty. Review of the MIP images confirms the above findings. IMPRESSION: 1. No acute pulmonary embolus, aortic dissection nor aneurysm. Mild coronary arteriosclerosis. 2. Moderate new right pleural effusion with adjacent atelectasis. Pleural-based right middle lobe nodular density with  adjacent intraparenchymal nodular opacities are nonspecific but may represent a post infectious or inflammatory etiology. Neoplasm can not be entirely excluded. 3. Chronic stable bilateral upper lobe pulmonary consolidations with bronchiectasis bilaterally with scarring. 4. New mild proximal hydroureteronephrosis with mild enhancement of the proximal urothelium of the ureters. Urinary tract infection might account this appearance. No calculus or urothelial mass is identified. 5. Chronic stable T11 compression. No suspicious osseous abnormalities. Electronically Signed   By: Ashley Royalty M.D.   On: 02/18/2016 14:16   Ct Abdomen Pelvis W Contrast  Result Date: 02/18/2016 CLINICAL DATA:  Dizziness onset 10 onset last evening without resolution. Hypertension. Diagnosed with rib fractures last week. Back pain. EXAM: CT ANGIOGRAPHY CHEST CT ABDOMEN AND PELVIS WITH CONTRAST TECHNIQUE: Multidetector CT imaging of the chest was performed using the standard protocol during bolus administration  of intravenous contrast. Multiplanar CT image reconstructions and MIPs were obtained to evaluate the vascular anatomy. Multidetector CT imaging of the abdomen and pelvis was performed using the standard protocol during bolus administration of intravenous contrast. CONTRAST:  100 cc Isovue 370 IV COMPARISON:  Chest CT 10/27/2015, CT abdomen and pelvis 10/28/2015. FINDINGS: CTA CHEST FINDINGS Cardiovascular: Conventional branch pattern for the great vessels. Aortic atherosclerosis without dissection or aneurysm. No acute pulmonary embolus. Top normal size cardiac chambers without pericardial effusion or thickening. Mild coronary arteriosclerosis is noted. Mediastinum/Nodes: 1 cm short axis right lower paratracheal lymph node. No supraclavicular lymphadenopathy. No known thyroid nodules. Right axillary lymph node dissection clips are present. No axillary lymphadenopathy. Esophagus is unremarkable apart from some residual in the distal  esophagus. Lungs/Pleura: Chronic biapical pleuroparenchymal areas of scarring and consolidation with bronchiectasis in the right upper lobe. Interval development of moderate sized right pleural effusion with adjacent compressive atelectasis. Nodular pleural-based thickening with adjacent parenchymal nodularity in the right middle lobe and right lower lobe, series 9, image 67 and 73 is identified possibly postinflammatory or postinfectious in etiology though neoplastic change is not entirely excluded. Right middle lobe subpleural nodular density measures up to 8 mm and in the right lower lobe 13 mm. Musculoskeletal: Stable T11 compression deformity. Diffuse patchy osteopenic appearance of bony thorax. Review of the MIP images confirms the above findings. CT ABDOMEN and PELVIS FINDINGS Hepatobiliary: Tiny left hepatic granuloma. No biliary dilatation or space-occupying mass. Gallbladder is unremarkable. Pancreas: Atrophic appearance of the pancreas without inflammation. No ductal dilatation or mass. Spleen: No splenomegaly. Slight heterogeneous enhancement may be due to timing of contrast. Adrenals/Urinary Tract: Chronically thickened appearance of the adrenal glands left-greater-than-right. Mild proximal hydroureteronephrosis without obstructing source. Small parapelvic renal cysts are seen bilaterally. Mild enhancement of the urothelium of the proximal ureters which be secondary to a urinary tract infection. No evidence of pyelonephritis. No renal mass. Urinary bladder is physiologically distended. There is probable mild pelvic floor relaxation. Stomach/Bowel: Contracted stomach. No acute bowel inflammation or obstruction. Appendix not identified but no pericecal inflammation is apparent. Vascular/Lymphatic: Aortic atherosclerosis without evidence of aneurysm or dissection. No lymphadenopathy in the abdomen and pelvis. Reproductive: Atrophic uterus and ovaries.  No adnexal mass. Other: Trace free fluid in the pelvis  likely physiologic. No pneumoperitoneum. Musculoskeletal: Osteopenic appearance of the axial and appendicular skeleton with dextroscoliosis of the lumbar spine. Marked compression deformity T11 with loss of approximately 80-90% of the height is unchanged. Status post right hip arthroplasty. Review of the MIP images confirms the above findings. IMPRESSION: 1. No acute pulmonary embolus, aortic dissection nor aneurysm. Mild coronary arteriosclerosis. 2. Moderate new right pleural effusion with adjacent atelectasis. Pleural-based right middle lobe nodular density with adjacent intraparenchymal nodular opacities are nonspecific but may represent a post infectious or inflammatory etiology. Neoplasm can not be entirely excluded. 3. Chronic stable bilateral upper lobe pulmonary consolidations with bronchiectasis bilaterally with scarring. 4. New mild proximal hydroureteronephrosis with mild enhancement of the proximal urothelium of the ureters. Urinary tract infection might account this appearance. No calculus or urothelial mass is identified. 5. Chronic stable T11 compression. No suspicious osseous abnormalities. Electronically Signed   By: Ashley Royalty M.D.   On: 02/18/2016 14:16    Procedures Procedures (including critical care time)  Medications Ordered in ED Medications  sodium chloride 0.9 % injection (not administered)  iopamidol (ISOVUE-370) 76 % injection (not administered)  iopamidol (ISOVUE-370) 76 % injection 100 mL (100 mLs Intravenous Contrast Given 02/18/16 1311)  Initial Impression / Assessment and Plan / ED Course  I have reviewed the triage vital signs and the nursing notes.  Pertinent labs & imaging results that were available during my care of the patient were reviewed by me and considered in my medical decision making (see chart for details).  Final Clinical Impressions(s) / ED Diagnoses  {I have reviewed and evaluated the relevant laboratory values. {I have reviewed and evaluated the  relevant imaging studies. {I have interpreted the relevant EKG. {I have reviewed the relevant previous healthcare records. {I have reviewed EMS Documentation. {I obtained HPI from historian. {Patient discussed with supervising physician.  ED Course:  Assessment: Pt is a 89yF with hx Cancer Right Breast Stage 1, Estrogen Receptor Positive. Started Tamoxifen on 01-20-16 who presents with left flank pain worsening this AM. Known rib fractures that were atraumatic. Called Oncology prior to visit to ED who recommends CT Chest/ABD/Pelvis. Eval PE or worsening malignancy. On exam, pt in NAD. Nontoxic/nonseptic appearing. VSS. Afebrile. Lungs CTA. Heart RRR. Abdomen nontender soft. CBC unremarkable. BMP unremarkable. UA unremarkable. Trop negative. EKG unremarkable. CT PE with Chest/ABD showed no acute PE or aortic dissection. Right middle lobe density noted as post infectious vs inflammatory vs neoplasm. New hydroureteronephrosis on left side. Seen by Urology and is actively being treated for UTI. Started on Cipro yesterday on 10 day course. T11 Compression fracture noted. Seen on xray several months prior. Seen by attending physician. Plan is to continue follow up with Oncology for PET scan. Symptoms possibly related to UTI. At time of discharge, Patient is in no acute distress. Vital Signs are stable. Patient is able to ambulate. Patient able to tolerate PO.   Disposition/Plan:  DC home Additional Verbal discharge instructions given and discussed with patient.  Pt Instructed to f/u with PCP in the next week for evaluation and treatment of symptoms. Return precautions given Pt acknowledges and agrees with plan  Supervising Physician Fatima Blank, MD  Final diagnoses:  Acute left-sided low back pain without sciatica  Acute cystitis without hematuria    New Prescriptions New Prescriptions   No medications on file     Shary Decamp, PA-C 02/18/16 [place], MD 02/19/16  1352

## 2016-02-18 NOTE — ED Triage Notes (Signed)
Per [facility]- Pt resides at [facility]- Yellow Copy present. Pt c/o of dizziness sudden onset last night 9pm without resolve until this aaam. Pt with elevated BP staff reports elevated BP 182/80. DX with Rib fx last week 6-9 no falls related to rib FX- Pt c/o of back pain ? Relations to rib FX. No CP/SOB then or at present. Pt has RUE restrictions from breast CA. Pt denies N/V/D and fever.

## 2016-02-18 NOTE — ED Notes (Signed)
ED Provider at bedside. EDP PEDRO

## 2016-02-18 NOTE — Telephone Encounter (Signed)
Back pain and behind shoulder blades, started last night. On Monday she c/o icky in head. Woozy, swimmy head, oriented.  Monday sent urine for culture and started on prophylactic antibiotic in case +.  S/w Selena Lesser and we will call [name] after she talks w/ MD on call.

## 2016-02-18 NOTE — ED Notes (Signed)
PT EXPRESSED CONCERNS WITH GOING HOME. PT STATES SHE REMAINS WEAK AND DOES NOT FEEL WELL

## 2016-02-18 NOTE — ED Notes (Signed)
PT AND FAMILY AWARE OF DISCHARGE. DISCHARGE INSTRUCTIONS REVIEWED. PT STATES SHE FEELS WORSE NOW THEN EARLIER. PT STATES SHE FEELS WEAK AND CONTINUES TO FEEL DIZZY. INQUIRED WHEN HER LAST MEAL. PT STATES 0800. DISCUSSED WITH EDPA TYLER FINDINGS AND ORDERED PLAN OF CARE TO FEED PT AND EVALUATED AFTER. PT GIVEN HAM SANDWICH AND MILK, PT AND FAMILY INFORMED OF PLAN OF CARE.

## 2016-02-18 NOTE — ED Notes (Signed)
CHARGE MATT Q RN MADE AWARE OF DELAY IN DISCHARGE.

## 2016-02-18 NOTE — ED Notes (Signed)
Patient transported to CT 

## 2016-02-18 NOTE — Telephone Encounter (Signed)
This provider called and spoke with pt's daughter Cassandra Glenn.  Patient was seen here the [facility] within the last 1-2 weeks with complaint of left rib pain.  He was found at that time that patient had fractured ribs 6 through 9 on the left.  Patient's daughter reports that patient was found to have 2 rib fractures on the left just prior to Cassandra breast cancer diagnosis in November or December 2017.  Patient states she's had no falls or other trauma to Cassandra left rib area.  During this entire time.  Plain film x-ray that revealed the left rib fractures last week, was unable to determine if these were actually pathological fractures or not.  Patient once again denied any recent fall or injury.  Dr. Jana Hakim aware of the rib fracture.  Findings; and has ordered both a PET scan and a full body bone scan to be obtained in early March 2018.  However, patient's daughter Cassandra Glenn called the [facility] back early this morning reporting that Cassandra Glenn is now complaining of increased pain to Cassandra left flank region and left upper ribs as well.  This provider then reviewed all with Dr. Theresia Lo call for Dr. Jana Hakim); and he suggested that patient undergo a CT of the chest/abdomen/pelvis for further evaluation today.  This provider called patient's daughter back-was advised that an ambulance had been called to the assisted living facility.  Patient lives in.  Patient will be transported to the [facility] emergency department for further evaluation and management today.  Cassandra Glenn states that patient continues with an elevated blood pressure that is not returning to baseline now.  She also has increased edema to Cassandra bilateral lower extremities.  Differential diagnosis from Cassandra reported symptoms include: Increased, possibly pathological left rib fractures, PE, progression of cancer, UTI.

## 2016-02-18 NOTE — Discharge Instructions (Signed)
Please read and follow all provided instructions.  Your diagnoses today include:  1. Acute left-sided low back pain without sciatica   2. Acute cystitis without hematuria     Tests performed today include: Vital signs. See below for your results today.   Medications prescribed:  Take as prescribed. Continue Antibiotics   Home care instructions:  Follow any educational materials contained in this packet.  Follow-up instructions: Please follow-up with your primary care provider for further evaluation of symptoms and treatment   Return instructions:  Please return to the Emergency Department if you do not get better, if you get worse, or new symptoms OR  - Fever (temperature greater than 101.78F)  - Bleeding that does not stop with holding pressure to the area    -Severe pain (please note that you may be more sore the day after your accident)  - Chest Pain  - Difficulty breathing  - Severe nausea or vomiting  - Inability to tolerate food and liquids  - Passing out  - Skin becoming red around your wounds  - Change in mental status (confusion or lethargy)  - New numbness or weakness    Please return if you have any other emergent concerns.  Additional Information:  Your vital signs today were: BP 171/59    Pulse 65    Temp 97.6 F (36.4 C)    Resp 16    Ht 5\' 1"  (1.549 m)    Wt 62.6 kg    SpO2 98%    BMI 26.07 kg/m  If your blood pressure (BP) was elevated above 135/85 this visit, please have this repeated by your doctor within one month. ---------------

## 2016-02-23 ENCOUNTER — Ambulatory Visit: Payer: Medicare Other | Admitting: Nurse Practitioner

## 2016-02-24 ENCOUNTER — Encounter: Payer: Self-pay | Admitting: Internal Medicine

## 2016-02-24 ENCOUNTER — Non-Acute Institutional Stay: Payer: Medicare Other | Admitting: Internal Medicine

## 2016-02-24 VITALS — BP 142/68 | HR 78 | Temp 97.5°F | Resp 20 | Ht 61.0 in | Wt 137.8 lb

## 2016-02-24 DIAGNOSIS — N390 Urinary tract infection, site not specified: Secondary | ICD-10-CM | POA: Diagnosis not present

## 2016-02-24 DIAGNOSIS — E871 Hypo-osmolality and hyponatremia: Secondary | ICD-10-CM | POA: Diagnosis not present

## 2016-02-24 DIAGNOSIS — S2242XA Multiple fractures of ribs, left side, initial encounter for closed fracture: Secondary | ICD-10-CM | POA: Diagnosis not present

## 2016-02-24 DIAGNOSIS — R918 Other nonspecific abnormal finding of lung field: Secondary | ICD-10-CM | POA: Diagnosis not present

## 2016-02-24 DIAGNOSIS — I10 Essential (primary) hypertension: Secondary | ICD-10-CM

## 2016-02-24 DIAGNOSIS — M25541 Pain in joints of right hand: Secondary | ICD-10-CM | POA: Diagnosis not present

## 2016-02-24 DIAGNOSIS — C7972 Secondary malignant neoplasm of left adrenal gland: Secondary | ICD-10-CM

## 2016-02-24 DIAGNOSIS — M25549 Pain in joints of unspecified hand: Secondary | ICD-10-CM | POA: Insufficient documentation

## 2016-02-24 DIAGNOSIS — R5381 Other malaise: Secondary | ICD-10-CM | POA: Diagnosis not present

## 2016-02-24 HISTORY — DX: Essential (primary) hypertension: I10

## 2016-02-24 NOTE — Progress Notes (Signed)
[place] [facility]: Clinic (12)     No Known Allergies  Chief Complaint  Patient presents with  . Acute Visit    cough, pain in (RT) in hand,   . Hospitalization Follow-up    pain in back when she breaths deeply,    HPI:  Seen in ED 02/18/16. Acute left sided low back pain and acute cystitis.   Hyponatremia - had been drinking a lot of Gsatorade to treat the hyuponatremia, but then heer BP started going up and she thought thi was re;lated, so she has cut out the gatorade.  Malaise - generally feels that she is ill. "Just don't feel right or healthy:.  Hypertension, unspecified type - multiple episodes of elevated SBP. She bought a wrist cuff to check BP.  Malignant neoplasm metastatic to left adrenal gland (HCC) - con tines to see Dr. Jana Hakim. There is a possibility that the adrenal met is related to the hyponatremia and to the elevated SBP.  Mass of right lung - has not been biopsied. Has a right pleurall effusion that I believe is likely related to the lung mass.  Arthralgia of right hand - pain and numbness in the right hand at night sometimes. OK today. No loss of use.  Urinary tract infection without hematuria, site unspecified - remains on Cipro for a 10 day course that will end in 3 more days.  Closed fracture of multiple ribs of left side, initial encounter - documented on CXR 02/11/16.    Medications: Patient's Medications  New Prescriptions   No medications on file  Previous Medications   ACETAMINOPHEN (TYLENOL) 500 MG TABLET    Take 1,000 mg by mouth every 6 (six) hours as needed for moderate pain.    ASPIRIN 81 MG CHEWABLE TABLET    Chew 81 mg by mouth every morning.    BILBERRY 1000 MG CAPS    Take 1,000 mg by mouth every morning.    CALCIUM CARBONATE-VITAMIN D (CALCIUM 600/VITAMIN D PO)    Take 600 mg by mouth 2 (two) times daily.    CALCIUM POLYCARBOPHIL (FIBERCON PO)    Take 1 tablet by mouth 2 (two) times daily.    CHOLECALCIFEROL  (VITAMIN D) 400 UNITS TABS    Take 400 Units by mouth every morning.    CIPROFLOXACIN (CIPRO) 250 MG TABLET    Take 250 mg by mouth 2 (two) times daily.   CRANBERRY EXTRACT PO    Take 1 tablet by mouth every morning.    FISH OIL-OMEGA-3 FATTY ACIDS 1000 MG CAPSULE    Take 1 g by mouth every morning.    GLUCOSAMINE-CHONDROITIN (GLUCOSAMINE CHONDR COMPLEX PO)    Take 1 tablet by mouth 2 (two) times daily. for joints   IBUPROFEN (ADVIL,MOTRIN) 200 MG TABLET    Take 200 mg by mouth. Take 2 tablets as needed for inflammatory pain   MECLIZINE (ANTIVERT) 25 MG TABLET    Take 1 tablet (25 mg total) by mouth 3 (three) times daily as needed for dizziness.   MULTIPLE VITAMINS-MINERALS (PRESERVISION/LUTEIN) CAPS    Take 1 capsule by mouth 2 (two) times daily.   NYSTATIN (MYCOSTATIN/NYSTOP) POWDER    Apply to affected skin daily to treat yeast   PROBIOTIC PRODUCT (CULTURELLE PRO-WELL PO)    Take by mouth. Take one probiotic tablet in morning   TAMOXIFEN (NOLVADEX) 20 MG TABLET    Take 20 mg by mouth daily.   TRIMETHOPRIM (TRIMPEX) 100  MG TABLET    Take 100 mg by mouth at bedtime.   ZOLPIDEM (AMBIEN) 5 MG TABLET    Take one half tablet at bedtime if needed for rest  Modified Medications   No medications on file  Discontinued Medications   FLUZONE HIGH-DOSE 0.5 ML SUSY    ADMINISTER 0.5ML IN THE MUSCLE AS DIRECTED     Review of Systems  Constitutional: Positive for activity change, appetite change and fatigue. Negative for fever and unexpected weight change.  HENT: Positive for hearing loss (bilateral earing aides). Negative for ear pain.   Eyes:       History of loss of visual acuity and macular degeneration.  Respiratory:       History of breast discomfort. History of removal of a lump of cancer from the right breast. Dyspnea on exertion.  Cardiovascular: Positive for leg swelling. Negative for chest pain and palpitations.  Gastrointestinal:       She frequently complains that her intestines are not  working right. There is a previous history of diverticulosis. Her last colonoscopy was in 2008. She has been seen by Dr. Paulita Fujita in the past. Constipation - she associates with Tramadol  Genitourinary: Positive for decreased urine volume, difficulty urinating and frequency. Negative for dysuria, vaginal bleeding and vaginal discharge.       Complains of a slow, weak urinary stream. Denies dysuria. Has increased frequency and nocturia. Saw Dr. Louis Meckel, urologist. She was taught self-catheterization for a bladder that does not empty well. Vaginal discomfort. Managed by Dr. Claudean Kinds. History of elevated CEA 125. Normal ultrasound of the abdomen and genitourinary tract. Recurrent UTI.  Musculoskeletal: Positive for back pain.       Chronic back pain.  Patient has a shorter right leg and has a lift in the right shoe. Now seeing Dr. Rhona Raider.  Right hip arthroplasty 5/16 There is generalized stiffness in the joints. There is a history of spinal stenosis. Left third finger catches on extension sometimes. There is a trigger finger. Pain at the left iliac crest and left anterior ribs near the sternum.  Skin:       History of skin discoloration and changes of nails.  Rash on the left scapula, right forearm, and left forearm. [place] in late Oct 2017 and contused the left side of the back.  Allergic/Immunologic: Negative.   Neurological: Positive for dizziness. Negative for tremors, seizures and numbness.       History of spinal stenosis with neurogenic claudication affecting the right leg. Complains of pain in the buttocks that is suggestive of neuralgia from her spinal stenosis. Hospitalized for 24 hours 08/24/2015 for vertigo.  Hematological: Bruises/bleeds easily.  Psychiatric/Behavioral: Positive for dysphoric mood.    Vitals:   02/24/16 0832  BP: (!) 142/68  Pulse: 78  Resp: 20  Temp: 97.5 F (36.4 C)  TempSrc: Oral  SpO2: 99%  Weight: 137 lb 12.8 oz (62.5 kg)  Height: 5' 1" (1.549 m)    Wt Readings from Last 3 Encounters:  02/24/16 137 lb 12.8 oz (62.5 kg)  02/18/16 138 lb (62.6 kg)  02/11/16 138 lb (62.6 kg)    Body mass index is 26.04 kg/m.  Physical Exam  Constitutional: She is oriented to person, place, and time. She appears well-developed and well-nourished. No distress.  HENT:  HOH Nonocclusive wax in both EAC.  Eyes:  Corrective lenses.  Neck: No JVD present. No tracheal deviation present. No thyromegaly present.  Cardiovascular: Normal rate, regular rhythm and normal heart sounds.  Exam reveals no gallop and no friction rub.   No murmur heard. Pulses in RLE not palpable, has 2+ edema which could be obscuring pulses  Pulmonary/Chest: Effort normal and breath sounds normal. She has no rales.  Abdominal: Soft. Bowel sounds are normal. She exhibits no distension and no mass. There is no tenderness.  Musculoskeletal: Normal range of motion. She exhibits edema (RLE) and tenderness (T12-L1 area).  Poor balance; using walker with PT following right hip replacement  Lymphadenopathy:    She has no cervical adenopathy.  Neurological: She is alert and oriented to person, place, and time. No cranial nerve deficit.  Skin: Skin is warm and dry. There is pallor.  Ovoid reddish patch at the left scapula. I am unable to find any blisters on this patch. Right forearm has a larger firm lump which has a reddish covering. Nontender. Left forearm has a reddish patch, but no lump of tissue under the patch. Left side of the back has a resolving hand sized bruise sustained in a contusion after a fall.  Psychiatric: She has a normal mood and affect. Her behavior is normal. Thought content normal.     Labs reviewed: Lab Summary Latest Ref Rng & Units 02/18/2016 02/11/2016 01/20/2016  Hemoglobin 12.0 - 15.0 g/dL 11.2(L) 10.9(L) 11.3(L)  Hematocrit 36.0 - 46.0 % 33.9(L) 32.7(L) 34.1(L)  White count 4.0 - 10.5 K/uL 7.3 8.7 8.5  Platelet count 150 - 400 K/uL 156 161 207  Sodium 135 -  145 mmol/L 129(L) 130(L) 130(L)  Potassium 3.5 - 5.1 mmol/L 4.2 4.8 4.6  Calcium 8.9 - 10.3 mg/dL 8.9 9.4 9.9  Phosphorus - (None) (None) (None)  Creatinine 0.44 - 1.00 mg/dL 0.71 0.8 0.8  AST 5 - 34 U/L (None) 40(H) 31  Alk Phos 40 - 150 U/L (None) 235(H) 149  Bilirubin 0.20 - 1.20 mg/dL (None) 0.50 0.41  Glucose 65 - 99 mg/dL 108(H) 103 78  Cholesterol - (None) (None) (None)  HDL cholesterol - (None) (None) (None)  Triglycerides - (None) (None) (None)  LDL Direct - (None) (None) (None)  LDL Calc - (None) (None) (None)  Total protein 6.4 - 8.3 g/dL (None) 6.5 6.6  Albumin 3.5 - 5.0 g/dL (None) 3.7 3.6  Some recent data might be hidden   Lab Results  Component Value Date   TSH 3.215 08/25/2015   Lab Results  Component Value Date   BUN 13 02/18/2016   BUN 13.5 02/11/2016   BUN 20.9 01/20/2016   Lab Results  Component Value Date   CREATININE 0.71 02/18/2016   CREATININE 0.8 02/11/2016   CREATININE 0.8 01/20/2016   Lab Results  Component Value Date   HGBA1C 5.9 01/23/2015    Ct Angio Chest Pe W And/or Wo Contrast  Result Date: 02/18/2016 CLINICAL DATA:  Dizziness onset 10 onset last evening without resolution. Hypertension. Diagnosed with rib fractures last week. Back pain. EXAM: CT ANGIOGRAPHY CHEST CT ABDOMEN AND PELVIS WITH CONTRAST TECHNIQUE: Multidetector CT imaging of the chest was performed using the standard protocol during bolus administration of intravenous contrast. Multiplanar CT image reconstructions and MIPs were obtained to evaluate the vascular anatomy. Multidetector CT imaging of the abdomen and pelvis was performed using the standard protocol during bolus administration of intravenous contrast. CONTRAST:  100 cc Isovue 370 IV COMPARISON:  Chest CT 10/27/2015, CT abdomen and pelvis 10/28/2015. FINDINGS: CTA CHEST FINDINGS Cardiovascular: Conventional branch pattern for the great vessels. Aortic atherosclerosis without dissection or aneurysm. No acute pulmonary  embolus. Top normal  size cardiac chambers without pericardial effusion or thickening. Mild coronary arteriosclerosis is noted. Mediastinum/Nodes: 1 cm short axis right lower paratracheal lymph node. No supraclavicular lymphadenopathy. No known thyroid nodules. Right axillary lymph node dissection clips are present. No axillary lymphadenopathy. Esophagus is unremarkable apart from some residual in the distal esophagus. Lungs/Pleura: Chronic biapical pleuroparenchymal areas of scarring and consolidation with bronchiectasis in the right upper lobe. Interval development of moderate sized right pleural effusion with adjacent compressive atelectasis. Nodular pleural-based thickening with adjacent parenchymal nodularity in the right middle lobe and right lower lobe, series 9, image 67 and 73 is identified possibly postinflammatory or postinfectious in etiology though neoplastic change is not entirely excluded. Right middle lobe subpleural nodular density measures up to 8 mm and in the right lower lobe 13 mm. Musculoskeletal: Stable T11 compression deformity. Diffuse patchy osteopenic appearance of bony thorax. Review of the MIP images confirms the above findings. CT ABDOMEN and PELVIS FINDINGS Hepatobiliary: Tiny left hepatic granuloma. No biliary dilatation or space-occupying mass. Gallbladder is unremarkable. Pancreas: Atrophic appearance of the pancreas without inflammation. No ductal dilatation or mass. Spleen: No splenomegaly. Slight heterogeneous enhancement may be due to timing of contrast. Adrenals/Urinary Tract: Chronically thickened appearance of the adrenal glands left-greater-than-right. Mild proximal hydroureteronephrosis without obstructing source. Small parapelvic renal cysts are seen bilaterally. Mild enhancement of the urothelium of the proximal ureters which be secondary to a urinary tract infection. No evidence of pyelonephritis. No renal mass. Urinary bladder is physiologically distended. There is  probable mild pelvic floor relaxation. Stomach/Bowel: Contracted stomach. No acute bowel inflammation or obstruction. Appendix not identified but no pericecal inflammation is apparent. Vascular/Lymphatic: Aortic atherosclerosis without evidence of aneurysm or dissection. No lymphadenopathy in the abdomen and pelvis. Reproductive: Atrophic uterus and ovaries.  No adnexal mass. Other: Trace free fluid in the pelvis likely physiologic. No pneumoperitoneum. Musculoskeletal: Osteopenic appearance of the axial and appendicular skeleton with dextroscoliosis of the lumbar spine. Marked compression deformity T11 with loss of approximately 80-90% of the height is unchanged. Status post right hip arthroplasty. Review of the MIP images confirms the above findings. IMPRESSION: 1. No acute pulmonary embolus, aortic dissection nor aneurysm. Mild coronary arteriosclerosis. 2. Moderate new right pleural effusion with adjacent atelectasis. Pleural-based right middle lobe nodular density with adjacent intraparenchymal nodular opacities are nonspecific but may represent a post infectious or inflammatory etiology. Neoplasm can not be entirely excluded. 3. Chronic stable bilateral upper lobe pulmonary consolidations with bronchiectasis bilaterally with scarring. 4. New mild proximal hydroureteronephrosis with mild enhancement of the proximal urothelium of the ureters. Urinary tract infection might account this appearance. No calculus or urothelial mass is identified. 5. Chronic stable T11 compression. No suspicious osseous abnormalities. Electronically Signed   By: Ashley Royalty M.D.   On: 02/18/2016 14:16   Ct Abdomen Pelvis W Contrast  Result Date: 02/18/2016 CLINICAL DATA:  Dizziness onset 10 onset last evening without resolution. Hypertension. Diagnosed with rib fractures last week. Back pain. EXAM: CT ANGIOGRAPHY CHEST CT ABDOMEN AND PELVIS WITH CONTRAST TECHNIQUE: Multidetector CT imaging of the chest was performed using the  standard protocol during bolus administration of intravenous contrast. Multiplanar CT image reconstructions and MIPs were obtained to evaluate the vascular anatomy. Multidetector CT imaging of the abdomen and pelvis was performed using the standard protocol during bolus administration of intravenous contrast. CONTRAST:  100 cc Isovue 370 IV COMPARISON:  Chest CT 10/27/2015, CT abdomen and pelvis 10/28/2015. FINDINGS: CTA CHEST FINDINGS Cardiovascular: Conventional branch pattern for the great vessels. Aortic atherosclerosis without  dissection or aneurysm. No acute pulmonary embolus. Top normal size cardiac chambers without pericardial effusion or thickening. Mild coronary arteriosclerosis is noted. Mediastinum/Nodes: 1 cm short axis right lower paratracheal lymph node. No supraclavicular lymphadenopathy. No known thyroid nodules. Right axillary lymph node dissection clips are present. No axillary lymphadenopathy. Esophagus is unremarkable apart from some residual in the distal esophagus. Lungs/Pleura: Chronic biapical pleuroparenchymal areas of scarring and consolidation with bronchiectasis in the right upper lobe. Interval development of moderate sized right pleural effusion with adjacent compressive atelectasis. Nodular pleural-based thickening with adjacent parenchymal nodularity in the right middle lobe and right lower lobe, series 9, image 67 and 73 is identified possibly postinflammatory or postinfectious in etiology though neoplastic change is not entirely excluded. Right middle lobe subpleural nodular density measures up to 8 mm and in the right lower lobe 13 mm. Musculoskeletal: Stable T11 compression deformity. Diffuse patchy osteopenic appearance of bony thorax. Review of the MIP images confirms the above findings. CT ABDOMEN and PELVIS FINDINGS Hepatobiliary: Tiny left hepatic granuloma. No biliary dilatation or space-occupying mass. Gallbladder is unremarkable. Pancreas: Atrophic appearance of the  pancreas without inflammation. No ductal dilatation or mass. Spleen: No splenomegaly. Slight heterogeneous enhancement may be due to timing of contrast. Adrenals/Urinary Tract: Chronically thickened appearance of the adrenal glands left-greater-than-right. Mild proximal hydroureteronephrosis without obstructing source. Small parapelvic renal cysts are seen bilaterally. Mild enhancement of the urothelium of the proximal ureters which be secondary to a urinary tract infection. No evidence of pyelonephritis. No renal mass. Urinary bladder is physiologically distended. There is probable mild pelvic floor relaxation. Stomach/Bowel: Contracted stomach. No acute bowel inflammation or obstruction. Appendix not identified but no pericecal inflammation is apparent. Vascular/Lymphatic: Aortic atherosclerosis without evidence of aneurysm or dissection. No lymphadenopathy in the abdomen and pelvis. Reproductive: Atrophic uterus and ovaries.  No adnexal mass. Other: Trace free fluid in the pelvis likely physiologic. No pneumoperitoneum. Musculoskeletal: Osteopenic appearance of the axial and appendicular skeleton with dextroscoliosis of the lumbar spine. Marked compression deformity T11 with loss of approximately 80-90% of the height is unchanged. Status post right hip arthroplasty. Review of the MIP images confirms the above findings. IMPRESSION: 1. No acute pulmonary embolus, aortic dissection nor aneurysm. Mild coronary arteriosclerosis. 2. Moderate new right pleural effusion with adjacent atelectasis. Pleural-based right middle lobe nodular density with adjacent intraparenchymal nodular opacities are nonspecific but may represent a post infectious or inflammatory etiology. Neoplasm can not be entirely excluded. 3. Chronic stable bilateral upper lobe pulmonary consolidations with bronchiectasis bilaterally with scarring. 4. New mild proximal hydroureteronephrosis with mild enhancement of the proximal urothelium of the ureters.  Urinary tract infection might account this appearance. No calculus or urothelial mass is identified. 5. Chronic stable T11 compression. No suspicious osseous abnormalities. Electronically Signed   By: Ashley Royalty M.D.   On: 02/18/2016 14:16     Assessment/Plan  1. Malaise Multiple potential etiologies  2. Hyponatremia I do not think it is necessary to drink the Gatorade, but I emphasized 32-48 oz fluid daily. - Basic metabolic panel; Future  3. Hypertension, unspecified type Wrist cuff may read artificially high. Bp in ER could be related to stressful environment. I prefer to not add more medication at this time.  4. Malignant neoplasm metastatic to left adrenal gland [facility]) May be associated with the elevated SBP and the hypnatremia.  5. Mass of right lung cancerous  6. Arthralgia of right hand Possible carpal tunnel syndrome, bu I don't think it is bad enough to warrant PNCV yet.  7. Urinary tract infection without hematuria, site unspecified Finish Cipro  8. Closed fracture of multiple ribs of left side, initial encounter Tenderness of the left chest wall is improved.

## 2016-03-03 ENCOUNTER — Ambulatory Visit (INDEPENDENT_AMBULATORY_CARE_PROVIDER_SITE_OTHER): Payer: Medicare Other | Admitting: Internal Medicine

## 2016-03-03 ENCOUNTER — Encounter: Payer: Self-pay | Admitting: Internal Medicine

## 2016-03-03 VITALS — BP 138/80 | HR 70 | Temp 97.4°F | Ht 61.0 in | Wt 138.0 lb

## 2016-03-03 DIAGNOSIS — M79601 Pain in right arm: Secondary | ICD-10-CM | POA: Insufficient documentation

## 2016-03-03 DIAGNOSIS — F419 Anxiety disorder, unspecified: Secondary | ICD-10-CM | POA: Insufficient documentation

## 2016-03-03 DIAGNOSIS — R05 Cough: Secondary | ICD-10-CM

## 2016-03-03 DIAGNOSIS — R918 Other nonspecific abnormal finding of lung field: Secondary | ICD-10-CM | POA: Diagnosis not present

## 2016-03-03 DIAGNOSIS — R059 Cough, unspecified: Secondary | ICD-10-CM | POA: Insufficient documentation

## 2016-03-03 MED ORDER — ALPRAZOLAM 0.5 MG PO TABS: ORAL_TABLET | ORAL | 0 refills | Status: DC

## 2016-03-03 MED ORDER — HYDROCOD POLST-CPM POLST ER 10-8 MG/5ML PO SUER: ORAL | 0 refills | Status: DC

## 2016-03-03 NOTE — Progress Notes (Signed)
Facility  [facility]    Place of Service:   OFFICE    No Known Allergies  Chief Complaint  Patient presents with  . Acute Visit    cough not any better, worse than before. Dry cough. Here with daughter [name].    HPI:   Cough- day and night. Mostly nonproductive. Hurts in chest when she coughs. Daughter says the cough is so bad that she would not be surprised if she broke another rib. No hemoptysis, fever, or chills. Had CT angio of the chest on 02/18/16. Has right pleural effusion and a pleural based mass. Also has bronchiectasis.  Mass of right lung - cancer of uncertain etiology. Followed by Dr. Jana Hakim.  Right arm pain - initially stated several weeks ago       Medications: Patient's Medications  New Prescriptions   No medications on file  Previous Medications   ACETAMINOPHEN (TYLENOL) 500 MG TABLET    Take 1,000 mg by mouth every 6 (six) hours as needed for moderate pain.    ASPIRIN 81 MG CHEWABLE TABLET    Chew 81 mg by mouth every morning.    BILBERRY 1000 MG CAPS    Take 1,000 mg by mouth every morning.    CALCIUM CARBONATE-VITAMIN D (CALCIUM 600/VITAMIN D PO)    Take 600 mg by mouth 2 (two) times daily.    CALCIUM POLYCARBOPHIL (FIBERCON PO)    Take 1 tablet by mouth 2 (two) times daily.    CHOLECALCIFEROL (VITAMIN D) 400 UNITS TABS    Take 400 Units by mouth every morning.    CRANBERRY EXTRACT PO    Take 1 tablet by mouth every morning.    DEXTROMETHORPHAN-GUAIFENESIN (MUCINEX DM PO)    Take by mouth. Take one tablet twice daily   FISH OIL-OMEGA-3 FATTY ACIDS 1000 MG CAPSULE    Take 1 g by mouth every morning.    GLUCOSAMINE-CHONDROITIN (GLUCOSAMINE CHONDR COMPLEX PO)    Take 1 tablet by mouth 2 (two) times daily. for joints   IBUPROFEN (ADVIL,MOTRIN) 200 MG TABLET    Take 200 mg by mouth. Take 2 tablets as needed for inflammatory pain   MECLIZINE (ANTIVERT) 25 MG TABLET    Take 1 tablet (25 mg total) by mouth 3 (three) times daily as needed for dizziness.   MULTIPLE  VITAMINS-MINERALS (PRESERVISION/LUTEIN) CAPS    Take 1 capsule by mouth 2 (two) times daily.   NYSTATIN (MYCOSTATIN/NYSTOP) POWDER    Apply to affected skin daily to treat yeast   PROBIOTIC PRODUCT (CULTURELLE PRO-WELL PO)    Take by mouth. Take one probiotic tablet in morning   TAMOXIFEN (NOLVADEX) 20 MG TABLET    Take 20 mg by mouth daily.   TRIMETHOPRIM (TRIMPEX) 100 MG TABLET    Take 100 mg by mouth at bedtime.   ZOLPIDEM (AMBIEN) 5 MG TABLET    Take one half tablet at bedtime if needed for rest  Modified Medications   No medications on file  Discontinued Medications   CIPROFLOXACIN (CIPRO) 250 MG TABLET    Take 250 mg by mouth 2 (two) times daily.    Review of Systems  Constitutional: Positive for activity change, appetite change and fatigue. Negative for fever and unexpected weight change.  HENT: Positive for hearing loss (bilateral earing aides). Negative for ear pain.   Eyes:       History of loss of visual acuity and macular degeneration.  Respiratory:       History of breast discomfort.  History of removal of a lump of cancer from the right breast. Dyspnea on exertion.  Cardiovascular: Positive for leg swelling. Negative for chest pain and palpitations.  Gastrointestinal:       She frequently complains that her intestines are not working right. There is a previous history of diverticulosis. Her last colonoscopy was in 2008. She has been seen by Dr. Paulita Fujita in the past. Constipation - she associates with Tramadol  Genitourinary: Positive for decreased urine volume, difficulty urinating and frequency. Negative for dysuria, vaginal bleeding and vaginal discharge.       Complains of a slow, weak urinary stream. Denies dysuria. Has increased frequency and nocturia. Saw Dr. Louis Meckel, urologist. She was taught self-catheterization for a bladder that does not empty well. Vaginal discomfort. Managed by Dr. Claudean Kinds. History of elevated CEA 125. Normal ultrasound of the abdomen and  genitourinary tract. Recurrent UTI.  Musculoskeletal: Positive for back pain.       Chronic back pain.  Patient has a shorter right leg and has a lift in the right shoe. Now seeing Dr. Rhona Raider.  Right hip arthroplasty 5/16 There is generalized stiffness in the joints. There is a history of spinal stenosis. Left third finger catches on extension sometimes. There is a trigger finger. Pain at the left iliac crest and left anterior ribs near the sternum.  Skin:       History of skin discoloration and changes of nails.  Rash on the left scapula, right forearm, and left forearm. [place] in late Oct 2017 and contused the left side of the back.  Allergic/Immunologic: Negative.   Neurological: Positive for dizziness. Negative for tremors, seizures and numbness.       History of spinal stenosis with neurogenic claudication affecting the right leg. Complains of pain in the buttocks that is suggestive of neuralgia from her spinal stenosis. Hospitalized for 24 hours 08/24/2015 for vertigo.  Hematological: Bruises/bleeds easily.  Psychiatric/Behavioral: Positive for dysphoric mood.    Vitals:   03/03/16 1609  BP: 138/80  Pulse: 70  Temp: 97.4 F (36.3 C)  TempSrc: Oral  SpO2: 98%  Weight: 138 lb (62.6 kg)  Height: 5' 1"  (1.549 m)   Body mass index is 26.07 kg/m. Wt Readings from Last 3 Encounters:  03/03/16 138 lb (62.6 kg)  02/24/16 137 lb 12.8 oz (62.5 kg)  02/18/16 138 lb (62.6 kg)      Physical Exam  Constitutional: She is oriented to person, place, and time. She appears well-developed and well-nourished. No distress.  HENT:  HOH Nonocclusive wax in both EAC.  Eyes:  Corrective lenses.  Neck: No JVD present. No tracheal deviation present. No thyromegaly present.  Cardiovascular: Normal rate, regular rhythm and normal heart sounds.  Exam reveals no gallop and no friction rub.   No murmur heard. Pulses in RLE not palpable, has 2+ edema which could be obscuring pulses    Pulmonary/Chest: Effort normal and breath sounds normal. She has no rales.  Abdominal: Soft. Bowel sounds are normal. She exhibits no distension and no mass. There is no tenderness.  Musculoskeletal: Normal range of motion. She exhibits edema (RLE) and tenderness (T12-L1 area).  Poor balance; using walker with PT following right hip replacement  Lymphadenopathy:    She has no cervical adenopathy.  Neurological: She is alert and oriented to person, place, and time. No cranial nerve deficit.  Skin: Skin is warm and dry. There is pallor.  Ovoid reddish patch at the left scapula. I am unable to find any blisters  on this patch. Right forearm has a larger firm lump which has a reddish covering. Nontender. Left forearm has a reddish patch, but no lump of tissue under the patch. Left side of the back has a resolving hand sized bruise sustained in a contusion after a fall.  Psychiatric: She has a normal mood and affect. Her behavior is normal. Thought content normal.    Labs reviewed: Lab Summary Latest Ref Rng & Units 02/18/2016 02/11/2016 01/20/2016  Hemoglobin 12.0 - 15.0 g/dL 11.2(L) 10.9(L) 11.3(L)  Hematocrit 36.0 - 46.0 % 33.9(L) 32.7(L) 34.1(L)  White count 4.0 - 10.5 K/uL 7.3 8.7 8.5  Platelet count 150 - 400 K/uL 156 161 207  Sodium 135 - 145 mmol/L 129(L) 130(L) 130(L)  Potassium 3.5 - 5.1 mmol/L 4.2 4.8 4.6  Calcium 8.9 - 10.3 mg/dL 8.9 9.4 9.9  Phosphorus - (None) (None) (None)  Creatinine 0.44 - 1.00 mg/dL 0.71 0.8 0.8  AST 5 - 34 U/L (None) 40(H) 31  Alk Phos 40 - 150 U/L (None) 235(H) 149  Bilirubin 0.20 - 1.20 mg/dL (None) 0.50 0.41  Glucose 65 - 99 mg/dL 108(H) 103 78  Cholesterol - (None) (None) (None)  HDL cholesterol - (None) (None) (None)  Triglycerides - (None) (None) (None)  LDL Direct - (None) (None) (None)  LDL Calc - (None) (None) (None)  Total protein 6.4 - 8.3 g/dL (None) 6.5 6.6  Albumin 3.5 - 5.0 g/dL (None) 3.7 3.6  Some recent data might be hidden   Lab  Results  Component Value Date   TSH 3.215 08/25/2015   TSH 5.349 (H) 05/30/2014   TSH 0.911 11/23/2012   Lab Results  Component Value Date   BUN 13 02/18/2016   BUN 13.5 02/11/2016   BUN 20.9 01/20/2016   Lab Results  Component Value Date   HGBA1C 5.9 01/23/2015   Ct Angio Chest Pe W And/or Wo Contrast  Result Date: 02/18/2016 CLINICAL DATA:  Dizziness onset 10 onset last evening without resolution. Hypertension. Diagnosed with rib fractures last week. Back pain. EXAM: CT ANGIOGRAPHY CHEST CT ABDOMEN AND PELVIS WITH CONTRAST TECHNIQUE: Multidetector CT imaging of the chest was performed using the standard protocol during bolus administration of intravenous contrast. Multiplanar CT image reconstructions and MIPs were obtained to evaluate the vascular anatomy. Multidetector CT imaging of the abdomen and pelvis was performed using the standard protocol during bolus administration of intravenous contrast. CONTRAST:  100 cc Isovue 370 IV COMPARISON:  Chest CT 10/27/2015, CT abdomen and pelvis 10/28/2015. FINDINGS: CTA CHEST FINDINGS Cardiovascular: Conventional branch pattern for the great vessels. Aortic atherosclerosis without dissection or aneurysm. No acute pulmonary embolus. Top normal size cardiac chambers without pericardial effusion or thickening. Mild coronary arteriosclerosis is noted. Mediastinum/Nodes: 1 cm short axis right lower paratracheal lymph node. No supraclavicular lymphadenopathy. No known thyroid nodules. Right axillary lymph node dissection clips are present. No axillary lymphadenopathy. Esophagus is unremarkable apart from some residual in the distal esophagus. Lungs/Pleura: Chronic biapical pleuroparenchymal areas of scarring and consolidation with bronchiectasis in the right upper lobe. Interval development of moderate sized right pleural effusion with adjacent compressive atelectasis. Nodular pleural-based thickening with adjacent parenchymal nodularity in the right middle lobe  and right lower lobe, series 9, image 67 and 73 is identified possibly postinflammatory or postinfectious in etiology though neoplastic change is not entirely excluded. Right middle lobe subpleural nodular density measures up to 8 mm and in the right lower lobe 13 mm. Musculoskeletal: Stable T11 compression deformity. Diffuse patchy osteopenic appearance of  bony thorax. Review of the MIP images confirms the above findings. CT ABDOMEN and PELVIS FINDINGS Hepatobiliary: Tiny left hepatic granuloma. No biliary dilatation or space-occupying mass. Gallbladder is unremarkable. Pancreas: Atrophic appearance of the pancreas without inflammation. No ductal dilatation or mass. Spleen: No splenomegaly. Slight heterogeneous enhancement may be due to timing of contrast. Adrenals/Urinary Tract: Chronically thickened appearance of the adrenal glands left-greater-than-right. Mild proximal hydroureteronephrosis without obstructing source. Small parapelvic renal cysts are seen bilaterally. Mild enhancement of the urothelium of the proximal ureters which be secondary to a urinary tract infection. No evidence of pyelonephritis. No renal mass. Urinary bladder is physiologically distended. There is probable mild pelvic floor relaxation. Stomach/Bowel: Contracted stomach. No acute bowel inflammation or obstruction. Appendix not identified but no pericecal inflammation is apparent. Vascular/Lymphatic: Aortic atherosclerosis without evidence of aneurysm or dissection. No lymphadenopathy in the abdomen and pelvis. Reproductive: Atrophic uterus and ovaries.  No adnexal mass. Other: Trace free fluid in the pelvis likely physiologic. No pneumoperitoneum. Musculoskeletal: Osteopenic appearance of the axial and appendicular skeleton with dextroscoliosis of the lumbar spine. Marked compression deformity T11 with loss of approximately 80-90% of the height is unchanged. Status post right hip arthroplasty. Review of the MIP images confirms the above  findings. IMPRESSION: 1. No acute pulmonary embolus, aortic dissection nor aneurysm. Mild coronary arteriosclerosis. 2. Moderate new right pleural effusion with adjacent atelectasis. Pleural-based right middle lobe nodular density with adjacent intraparenchymal nodular opacities are nonspecific but may represent a post infectious or inflammatory etiology. Neoplasm can not be entirely excluded. 3. Chronic stable bilateral upper lobe pulmonary consolidations with bronchiectasis bilaterally with scarring. 4. New mild proximal hydroureteronephrosis with mild enhancement of the proximal urothelium of the ureters. Urinary tract infection might account this appearance. No calculus or urothelial mass is identified. 5. Chronic stable T11 compression. No suspicious osseous abnormalities. Electronically Signed   By: Ashley Royalty M.D.   On: 02/18/2016 14:16   Ct Abdomen Pelvis W Contrast  Result Date: 02/18/2016 CLINICAL DATA:  Dizziness onset 10 onset last evening without resolution. Hypertension. Diagnosed with rib fractures last week. Back pain. EXAM: CT ANGIOGRAPHY CHEST CT ABDOMEN AND PELVIS WITH CONTRAST TECHNIQUE: Multidetector CT imaging of the chest was performed using the standard protocol during bolus administration of intravenous contrast. Multiplanar CT image reconstructions and MIPs were obtained to evaluate the vascular anatomy. Multidetector CT imaging of the abdomen and pelvis was performed using the standard protocol during bolus administration of intravenous contrast. CONTRAST:  100 cc Isovue 370 IV COMPARISON:  Chest CT 10/27/2015, CT abdomen and pelvis 10/28/2015. FINDINGS: CTA CHEST FINDINGS Cardiovascular: Conventional branch pattern for the great vessels. Aortic atherosclerosis without dissection or aneurysm. No acute pulmonary embolus. Top normal size cardiac chambers without pericardial effusion or thickening. Mild coronary arteriosclerosis is noted. Mediastinum/Nodes: 1 cm short axis right lower  paratracheal lymph node. No supraclavicular lymphadenopathy. No known thyroid nodules. Right axillary lymph node dissection clips are present. No axillary lymphadenopathy. Esophagus is unremarkable apart from some residual in the distal esophagus. Lungs/Pleura: Chronic biapical pleuroparenchymal areas of scarring and consolidation with bronchiectasis in the right upper lobe. Interval development of moderate sized right pleural effusion with adjacent compressive atelectasis. Nodular pleural-based thickening with adjacent parenchymal nodularity in the right middle lobe and right lower lobe, series 9, image 67 and 73 is identified possibly postinflammatory or postinfectious in etiology though neoplastic change is not entirely excluded. Right middle lobe subpleural nodular density measures up to 8 mm and in the right lower lobe 13 mm. Musculoskeletal:  Stable T11 compression deformity. Diffuse patchy osteopenic appearance of bony thorax. Review of the MIP images confirms the above findings. CT ABDOMEN and PELVIS FINDINGS Hepatobiliary: Tiny left hepatic granuloma. No biliary dilatation or space-occupying mass. Gallbladder is unremarkable. Pancreas: Atrophic appearance of the pancreas without inflammation. No ductal dilatation or mass. Spleen: No splenomegaly. Slight heterogeneous enhancement may be due to timing of contrast. Adrenals/Urinary Tract: Chronically thickened appearance of the adrenal glands left-greater-than-right. Mild proximal hydroureteronephrosis without obstructing source. Small parapelvic renal cysts are seen bilaterally. Mild enhancement of the urothelium of the proximal ureters which be secondary to a urinary tract infection. No evidence of pyelonephritis. No renal mass. Urinary bladder is physiologically distended. There is probable mild pelvic floor relaxation. Stomach/Bowel: Contracted stomach. No acute bowel inflammation or obstruction. Appendix not identified but no pericecal inflammation is  apparent. Vascular/Lymphatic: Aortic atherosclerosis without evidence of aneurysm or dissection. No lymphadenopathy in the abdomen and pelvis. Reproductive: Atrophic uterus and ovaries.  No adnexal mass. Other: Trace free fluid in the pelvis likely physiologic. No pneumoperitoneum. Musculoskeletal: Osteopenic appearance of the axial and appendicular skeleton with dextroscoliosis of the lumbar spine. Marked compression deformity T11 with loss of approximately 80-90% of the height is unchanged. Status post right hip arthroplasty. Review of the MIP images confirms the above findings. IMPRESSION: 1. No acute pulmonary embolus, aortic dissection nor aneurysm. Mild coronary arteriosclerosis. 2. Moderate new right pleural effusion with adjacent atelectasis. Pleural-based right middle lobe nodular density with adjacent intraparenchymal nodular opacities are nonspecific but may represent a post infectious or inflammatory etiology. Neoplasm can not be entirely excluded. 3. Chronic stable bilateral upper lobe pulmonary consolidations with bronchiectasis bilaterally with scarring. 4. New mild proximal hydroureteronephrosis with mild enhancement of the proximal urothelium of the ureters. Urinary tract infection might account this appearance. No calculus or urothelial mass is identified. 5. Chronic stable T11 compression. No suspicious osseous abnormalities. Electronically Signed   By: Ashley Royalty M.D.   On: 02/18/2016 14:16   Assessment/Plan 1. Cough - chlorpheniramine-HYDROcodone (TUSSIONEX PENNKINETIC ER) 10-8 MG/5ML SUER; 5 cc every 12 hours if needed for cough  Dispense: 140 mL; Refill: 0  2. Right arm pain - Nerve conduction test; Future  3. Mass of right lung Known lesion currently being followed by Dr. Jana Hakim. It is possible the cough is related to this and the pleural effusion.  4. Anxiety - ALPRAZolam (XANAX) 0.5 MG tablet; One up to 3 times daily for anxiety  Dispense: 100 tablet; Refill: 0

## 2016-03-09 ENCOUNTER — Ambulatory Visit (INDEPENDENT_AMBULATORY_CARE_PROVIDER_SITE_OTHER): Payer: Medicare Other | Admitting: Neurology

## 2016-03-09 DIAGNOSIS — M79601 Pain in right arm: Secondary | ICD-10-CM

## 2016-03-09 DIAGNOSIS — M5412 Radiculopathy, cervical region: Secondary | ICD-10-CM

## 2016-03-09 NOTE — Procedures (Signed)
[facility]  [place], [address]  [place], [place] [address] Tel: [phone] Fax:  [phone] Test Date:  03/09/2016  Patient: Cassandra Glenn DOB: 10/20/2020 Physician: Narda Amber, DO  Sex: Female Height: 5\' 1"  Ref Phys: Jeanmarie Hubert, M.D.  ID#: [id number] Temp: 35.8C Technician: Jerilynn Mages. Dean   Patient Complaints: This is a 83 year old female referred for evaluation of right hand shoulder and forearm numbness radiating into the hand.   NCV & EMG Findings: Extensive electrodiagnostic testing of the right upper extremity shows:  1. Right median, ulnar, radial, and mixed palmar sensory responses are within normal limits.  2. Right median and ulnar motor responses are within normal limits.  3. Chronic motor axon loss changes are seen affecting the pronator teres and biceps muscles, without active denervation.  Impression: 1. Chronic C6 radiculopathy affecting the right upper extremity, moderate in degree electrically. 2. There is no evidence of carpal tunnel syndrome or a sensorimotor polyneuropathy affecting the right upper extremity.   ___________________________ Narda Amber, DO    Nerve Conduction Studies Anti Sensory Summary Table   Site NR Peak (ms) Norm Peak (ms) P-T Amp (V) Norm P-T Amp  Right Median Anti Sensory (2nd Digit)  35.8C  Wrist    3.2 <3.7 22.3 >20  Right Radial Anti Sensory (Base 1st Digit)  35.8C  Wrist    2.0 <2.7 26.2 >20  Right Ulnar Anti Sensory (5th Digit)  35.8C  Wrist    3.5 <3.5 12.1 >10   Motor Summary Table   Site NR Onset (ms) Norm Onset (ms) O-P Amp (mV) Norm O-P Amp Site1 Site2 Delta-0 (ms) Dist (cm) Vel (m/s) Norm Vel (m/s)  Right Median Motor (Abd Poll Brev)  35.8C  Wrist    3.4 <4.4 5.7 >4 Elbow Wrist 4.1 24.0 53 >49  Elbow    7.5  5.7         Right Ulnar Motor (Abd Dig Minimi)  35.8C  Wrist    3.3 <3.5 8.1 >6 B Elbow Wrist 3.3 17.0 52 >49  B Elbow    6.6  7.4  A Elbow B Elbow 1.5 10.0 67 >49  A Elbow    8.1   7.1          Comparison Summary Table   Site NR Peak (ms) Norm Peak (ms) P-T Amp (V) Site1 Site2 Delta-P (ms) Norm Delta (ms)  Right Median/Ulnar Palm Comparison (Wrist - 8cm)  35.8C  Median Palm    1.8 <2.2 49.3 Median Palm Ulnar Palm 0.2   Ulnar Palm    2.0 <2.2 10.9       EMG   Side Muscle Ins Act Fibs Psw Fasc Number Recrt Dur Dur. Amp Amp. Poly Poly. Comment  Right 1stDorInt Nml Nml Nml Nml Nml Nml Nml Nml Nml Nml Nml Nml N/A  Right Ext Indicis Nml Nml Nml Nml Nml Nml Nml Nml Nml Nml Nml Nml N/A  Right PronatorTeres Nml Nml Nml Nml 2- Rapid Some 1+ Some 2+ Some 2+ N/A  Right Biceps Nml Nml Nml Nml 1- Rapid Some 1+ Some 1+ Some 1+ N/A  Right Triceps Nml Nml Nml Nml Nml Nml Nml Nml Nml Nml Nml Nml N/A  Right Deltoid Nml Nml Nml Nml Nml Nml Nml Nml Nml Nml Nml Nml N/A      Waveforms:

## 2016-03-10 ENCOUNTER — Telehealth: Payer: Self-pay | Admitting: *Deleted

## 2016-03-10 ENCOUNTER — Other Ambulatory Visit: Payer: Self-pay | Admitting: Oncology

## 2016-03-10 ENCOUNTER — Telehealth: Payer: Self-pay | Admitting: Oncology

## 2016-03-10 DIAGNOSIS — Z170 Estrogen receptor positive status [ER+]: Principal | ICD-10-CM

## 2016-03-10 DIAGNOSIS — C50911 Malignant neoplasm of unspecified site of right female breast: Secondary | ICD-10-CM

## 2016-03-10 NOTE — Telephone Encounter (Signed)
Daughter of patient called stating that patient has been complaining of constant back pain. Patient recently received prescription for possible UTI thinking that this may probably be the reason for it. Patient back pain did not resolve and now the daughter would like to follow up with MD Magrinat to evaluate this.

## 2016-03-10 NOTE — Telephone Encounter (Signed)
sw pt dtr to confirm appt for 3/1 at 1 pm

## 2016-03-11 ENCOUNTER — Ambulatory Visit (HOSPITAL_COMMUNITY)
Admission: RE | Admit: 2016-03-11 | Discharge: 2016-03-11 | Disposition: A | Discharge status: Home / Self Care | Payer: Medicare Other | Source: Ambulatory Visit | Attending: Adult Health | Admitting: Adult Health

## 2016-03-11 ENCOUNTER — Ambulatory Visit (HOSPITAL_BASED_OUTPATIENT_CLINIC_OR_DEPARTMENT_OTHER): Payer: Medicare Other | Admitting: Adult Health

## 2016-03-11 ENCOUNTER — Other Ambulatory Visit (HOSPITAL_BASED_OUTPATIENT_CLINIC_OR_DEPARTMENT_OTHER): Payer: Medicare Other

## 2016-03-11 ENCOUNTER — Encounter: Payer: Self-pay | Admitting: Adult Health

## 2016-03-11 VITALS — BP 162/63 | HR 80 | Temp 97.6°F | Resp 18 | Wt 133.1 lb

## 2016-03-11 DIAGNOSIS — R918 Other nonspecific abnormal finding of lung field: Secondary | ICD-10-CM | POA: Diagnosis not present

## 2016-03-11 DIAGNOSIS — M899 Disorder of bone, unspecified: Secondary | ICD-10-CM | POA: Diagnosis not present

## 2016-03-11 DIAGNOSIS — I700 Atherosclerosis of aorta: Secondary | ICD-10-CM | POA: Diagnosis not present

## 2016-03-11 DIAGNOSIS — J90 Pleural effusion, not elsewhere classified: Secondary | ICD-10-CM | POA: Insufficient documentation

## 2016-03-11 DIAGNOSIS — M4854XA Collapsed vertebra, not elsewhere classified, thoracic region, initial encounter for fracture: Secondary | ICD-10-CM | POA: Diagnosis not present

## 2016-03-11 DIAGNOSIS — M546 Pain in thoracic spine: Secondary | ICD-10-CM

## 2016-03-11 DIAGNOSIS — Z170 Estrogen receptor positive status [ER+]: Secondary | ICD-10-CM | POA: Diagnosis not present

## 2016-03-11 DIAGNOSIS — N390 Urinary tract infection, site not specified: Secondary | ICD-10-CM | POA: Diagnosis not present

## 2016-03-11 DIAGNOSIS — M47814 Spondylosis without myelopathy or radiculopathy, thoracic region: Secondary | ICD-10-CM | POA: Diagnosis not present

## 2016-03-11 DIAGNOSIS — C50911 Malignant neoplasm of unspecified site of right female breast: Secondary | ICD-10-CM

## 2016-03-11 DIAGNOSIS — C50919 Malignant neoplasm of unspecified site of unspecified female breast: Secondary | ICD-10-CM

## 2016-03-11 DIAGNOSIS — Z79810 Long term (current) use of selective estrogen receptor modulators (SERMs): Secondary | ICD-10-CM | POA: Diagnosis not present

## 2016-03-11 DIAGNOSIS — M810 Age-related osteoporosis without current pathological fracture: Secondary | ICD-10-CM | POA: Diagnosis not present

## 2016-03-11 DIAGNOSIS — R079 Chest pain, unspecified: Secondary | ICD-10-CM | POA: Diagnosis not present

## 2016-03-11 DIAGNOSIS — C7971 Secondary malignant neoplasm of right adrenal gland: Secondary | ICD-10-CM

## 2016-03-11 DIAGNOSIS — C7972 Secondary malignant neoplasm of left adrenal gland: Secondary | ICD-10-CM

## 2016-03-11 LAB — CBC WITH DIFFERENTIAL/PLATELET
BASO%: 0.6 % (ref 0.0–2.0)
Basophils Absolute: 0 10*3/uL (ref 0.0–0.1)
EOS%: 3.2 % (ref 0.0–7.0)
Eosinophils Absolute: 0.2 10*3/uL (ref 0.0–0.5)
HEMATOCRIT: 32.5 % — AB (ref 34.8–46.6)
HEMOGLOBIN: 10.9 g/dL — AB (ref 11.6–15.9)
LYMPH#: 1.7 10*3/uL (ref 0.9–3.3)
LYMPH%: 27.9 % (ref 14.0–49.7)
MCH: 31.4 pg (ref 25.1–34.0)
MCHC: 33.5 g/dL (ref 31.5–36.0)
MCV: 93.7 fL (ref 79.5–101.0)
MONO#: 0.5 10*3/uL (ref 0.1–0.9)
MONO%: 7.9 % (ref 0.0–14.0)
NEUT%: 60.4 % (ref 38.4–76.8)
NEUTROS ABS: 3.7 10*3/uL (ref 1.5–6.5)
Platelets: 210 10*3/uL (ref 145–400)
RBC: 3.46 10*6/uL — ABNORMAL LOW (ref 3.70–5.45)
RDW: 15.8 % — AB (ref 11.2–14.5)
WBC: 6.2 10*3/uL (ref 3.9–10.3)

## 2016-03-11 LAB — COMPREHENSIVE METABOLIC PANEL
ALBUMIN: 3.4 g/dL — AB (ref 3.5–5.0)
ALT: 22 U/L (ref 0–55)
AST: 32 U/L (ref 5–34)
Alkaline Phosphatase: 420 U/L — ABNORMAL HIGH (ref 40–150)
Anion Gap: 5 mEq/L (ref 3–11)
BUN: 10.1 mg/dL (ref 7.0–26.0)
CALCIUM: 8.7 mg/dL (ref 8.4–10.4)
CHLORIDE: 96 meq/L — AB (ref 98–109)
CO2: 24 mEq/L (ref 22–29)
CREATININE: 0.7 mg/dL (ref 0.6–1.1)
EGFR: 71 mL/min/{1.73_m2} — ABNORMAL LOW (ref >90)
Glucose: 119 mg/dl (ref 70–140)
Potassium: 4.8 mEq/L (ref 3.5–5.1)
Sodium: 126 mEq/L — ABNORMAL LOW (ref 136–145)
Total Bilirubin: 0.34 mg/dL (ref 0.20–1.20)
Total Protein: 6.1 g/dL — ABNORMAL LOW (ref 6.4–8.3)

## 2016-03-11 LAB — URINALYSIS, MICROSCOPIC - CHCC
Bilirubin (Urine): NEGATIVE
Blood: NEGATIVE
GLUCOSE UR CHCC: NEGATIVE mg/dL
KETONES: NEGATIVE mg/dL
SPECIFIC GRAVITY, URINE: 1.015 (ref 1.003–1.035)
Urobilinogen, UR: 0.2 mg/dL (ref 0.2–1)
pH: 6 (ref 4.6–8.0)

## 2016-03-11 MED ORDER — TRAMADOL HCL 50 MG PO TABS: 25.0000 mg | ORAL_TABLET | Freq: Four times a day (QID) | ORAL | 0 refills | Status: DC | PRN

## 2016-03-11 NOTE — Progress Notes (Signed)
The drugs is already [facility]  [phone] [phone]     ID: COURTLAND COPPA DOB: 03/11/1920  MR#: [id number]  DXI#:[id number]  Patient Care Team: Estill Dooms, MD as PCP - General (Internal Medicine) [place] Otho Darner, NP as Nurse Practitioner (Nurse Practitioner) Shon Hough, MD as Consulting Physician (Ophthalmology) Molli Posey, MD as Consulting Physician (Obstetrics and Gynecology) Gerarda Fraction, MD as Consulting Physician (Ophthalmology) Lorelle Gibbs, MD as Consulting Physician (Radiology) Melrose Nakayama, MD as Consulting Physician (Orthopedic Surgery) Gerarda Fraction, MD as Referring Physician (Ophthalmology) Ardis Hughs, MD as Attending Physician (Urology) Chauncey Cruel, MD as Consulting Physician (Oncology) Charlestine Massed, NP OTHER MD:  CHIEF COMPLAINT: 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  This has been going on for about a month, but slowly worsening.  She was seen in the ED on 2/7 and underwent CTA and CT A/P.  There were some suggestions of possible cancer in the lungs on CTA regarding a right middle lobe nodular density.  She was trialed on antibiotics to see if she responded to them as a possible cause of her back pain.  Her pain has continued to worsen.  She did have a cough a couple of weeks that was diagnosed as viral bronchitis.  Her cough though present is slowly improving and almost resolved today.     REVIEW OF SYSTEMS: Denies focal weakness, numbness, tingling, saddle anesthesia, bowel/bladder incontinence.  A detailed ros was otherwise non contributory.    BREAST CANCER HISTORY: From the original intake note:  I saw [name] approximately approximately 81 years ago when she was diagnosed with right-sided invasive breast cancer, which was treated with radiation and  tamoxifen for 81 years. She also had a noninvasive breast cancer removed from the left breast at the same time.  More recently Tawona had a chest x-ray 10/22/2015 for evaluation of a cough. The film showed a variety of incidental findings including atherosclerosis of the thoracic aorta and an old lower thoracic compression fracture. There was also however a right apical density which required further evaluation. Accordingly on 10/27/2015 she had a chest CT scan which found chronic areas of scarring in both lungs, not significantly changed from November 2015. However right middle lobe pleural thickening appeared worse, and there was increased nodular pleural thickening along the azygo-esophageal recess, suggestive of an indolent mesothelioma. There was a small cluster of nodules at the medial right lung base which was new from the prior study. There were no bony metastases but there were multiple chronic compression deformities and degenerative change.   Given these results, a PET scan was obtained 11/07/2015. There was a hypermetabolic right paratracheal lymph node measuring 1.2 cm, but no additional mediastinal adenopathy. Along the pleural surface of the azygo-esophageal region there was increased metabolic activity with an SUV max of 20.9. There was also intense metabolic activity associated with a thickened left adrenal gland, with an SUV max of 10.8. There was some hypermetabolic activity associated with the right adrenal gland. There were again no bony metastatic disease findings.  With this information the patient returns for further evaluation and treatment.   PAST MEDICAL HISTORY: Past Medical History:  Diagnosis Date  . Abnormalities of the hair 02/29/2012  . Arthritis   . Cancer ([place])   . Candidiasis of skin and nails 03/19/2011  . Closed fracture of base of  neck of femur ([place]) 03/18/2011  . Contact dermatitis and other eczema, due to unspecified cause 05/07/2011  . Depression 07/23/2014    . Diverticulosis of colon (without mention of hemorrhage) 03/19/2011  . Edema 05/07/2011  . HOH (hard of hearing)   . Hypopotassemia 03/26/2011  . Hyposmolality and/or hyponatremia 03/19/2011  . Hypotension, unspecified 03/18/2011  . Impaired fasting glucose 03/19/2011  . Insomnia, unspecified 03/19/2011  . Kidney infection   . Lumbago 03/19/2011  . Macular degeneration   . Malignant neoplasm of breast (female), unspecified site 10/28/2002  . Osteoporosis, unspecified 03/19/2011  . Other malaise and fatigue 03/19/2011  . Pain in joint, pelvic region and thigh 03/19/2011  . Rib fractures   . Right bundle branch block 08/30/2011  . Shortness of breath   . Spasm of muscle 03/19/2011  . Spinal stenosis, lumbar region, with neurogenic claudication 11/02/2011  . Trigger finger (acquired) 11/02/2011  . Unspecified constipation 03/19/2011  . Vaginitis and vulvovaginitis 02/29/2012  . Vertigo 08/26/2015    PAST SURGICAL HISTORY: Past Surgical History:  Procedure Laterality Date  . BREAST SURGERY Bilateral 2005-10/27/2004   lumpectomy- Streck,MD  . CATARACT EXTRACTION  2010   bialteral  . EYE SURGERY     cataract  . FRACTURE SURGERY Left 09/1980   ankle  . HARDWARE REMOVAL Right 05/28/2014   Procedure: HARDWARE REMOVAL;  Surgeon: Melrose Nakayama, MD;  Location: [facility];  Service: Orthopedics;  Laterality: Right;  . HIP PINNING,CANNULATED  03/08/2011   Procedure: CANNULATED HIP PINNING;  Surgeon: Johnn Hai, MD;  Location: [facility];  Service: Orthopedics;  Laterality: Right;  . ORIF HIP FRACTURE Right 03/08/2011   Bean,MD  . SKIN CANCER EXCISION Left 10/12/2012   lower leg Dr. [place]  . SKIN LESION EXCISION Right 02/04/2011   Abdomen lesion spongiotic dermatitis-Taffeen, MD  . SQUAMOUS CELL CARCINOMA EXCISION Right 02/04/2011   forearmSyble Creek, MD  . TONSILLECTOMY    . TOTAL HIP ARTHROPLASTY Right 05/28/2014   Procedure: TOTAL HIP ARTHROPLASTY ANTERIOR APPROACH;  Surgeon: Melrose Nakayama, MD;  Location: [facility];  Service: Orthopedics;  Laterality: Right;    FAMILY HISTORY Family History  Problem Relation Age of Onset  . Heart disease Mother   . Cancer Father   . Emphysema Brother   . Alzheimer's disease Brother   The patient's father died at the age of 41 with colon cancer. The patient's mother died at the age of 68 from heart disease the patient had 2 brothers, no sisters. Both brothers have died, one from emphysema and one from dementia.  GYNECOLOGIC HISTORY:  No LMP recorded. Patient is postmenopausal. Menarche age 74, first live birth age 19, the patient is [place] P2. She stopped having periods around age 65. She never took hormone replacement.  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 She is widowed. She lives in independent living section of friend's home [place]. Her daughter Mardene Celeste lives in [place] (actually [place]) where she teaches. Daughter [name] lives in [place] and is Web designer at [employer]. The patient has 5 grandchildren and 2 step grandchildren. She has one great grandchild and one on the way. She attends [employer]    ADVANCED DIRECTIVES: Raschelle has named both her daughters as joint health care power of attorney. She tells me she has a living will "somewhere in the house" and has a DO NOT RESUSCITATE order written at friends homes.   HEALTH MAINTENANCE: Social  History  Substance Use Topics  . Smoking status: Never Smoker  . Smokeless tobacco: Never Used  . Alcohol use 0.0 oz/week     Comment: occasional glass     Colonoscopy:  PAP:  Bone density:   No Known Allergies  Current Outpatient Prescriptions  Medication Sig Dispense Refill  . guaiFENesin (MUCINEX) 600 MG 12 hr tablet Take 600 mg by mouth 2 (two) times daily.    Marland Kitchen acetaminophen (TYLENOL) 500 MG tablet Take 1,000 mg by mouth every 6 (six) hours as needed for  moderate pain.     Marland Kitchen ALPRAZolam (XANAX) 0.5 MG tablet One up to 3 times daily for anxiety (Patient taking differently: One up to 3 times daily for anxiety) 100 tablet 0  . aspirin 81 MG chewable tablet Chew 81 mg by mouth every morning.     . Bilberry 1000 MG CAPS Take 1,000 mg by mouth every morning.     . Calcium Carbonate-Vitamin D (CALCIUM 600/VITAMIN D PO) Take 600 mg by mouth 2 (two) times daily.     . Calcium Polycarbophil (FIBERCON PO) Take 1 tablet by mouth 2 (two) times daily.     . cholecalciferol (VITAMIN D) 400 UNITS TABS Take 400 Units by mouth every morning.     Marland Kitchen CRANBERRY EXTRACT PO Take 1 tablet by mouth every morning.     . fish oil-omega-3 fatty acids 1000 MG capsule Take 1 g by mouth every morning.     . Glucosamine-Chondroitin (GLUCOSAMINE CHONDR COMPLEX PO) Take 1 tablet by mouth 2 (two) times daily. for joints    . ibuprofen (ADVIL,MOTRIN) 200 MG tablet Take 200 mg by mouth. Take 2 tablets as needed for inflammatory pain    . meclizine (ANTIVERT) 25 MG tablet Take 1 tablet (25 mg total) by mouth 3 (three) times daily as needed for dizziness. 30 tablet 0  . Multiple Vitamins-Minerals (PRESERVISION/LUTEIN) CAPS Take 1 capsule by mouth 2 (two) times daily.    Marland Kitchen nystatin (MYCOSTATIN/NYSTOP) powder Apply to affected skin daily to treat yeast (Patient taking differently: Apply to affected skin daily as needed to treat yeast) 45 g 4  . Probiotic Product (CULTURELLE PRO-WELL PO) Take by mouth. Take one probiotic tablet in morning    . tamoxifen (NOLVADEX) 20 MG tablet Take 20 mg by mouth daily.    . traMADol (ULTRAM) 50 MG tablet Take 0.5-1 tablets (25-50 mg total) by mouth every 6 (six) hours as needed. 60 tablet 0  . trimethoprim (TRIMPEX) 100 MG tablet Take 100 mg by mouth at bedtime.    Marland Kitchen zolpidem (AMBIEN) 5 MG tablet Take one half tablet at bedtime if needed for rest 15 tablet 5   No current facility-administered medications for this visit.     OBJECTIVE:  Vitals:    03/11/16 1339  BP: (!) 162/63  Pulse: 80  Resp: 18  Temp: 97.6 F (36.4 C)     Body mass index is 25.15 kg/m.    ECOG FS:1 - Symptomatic but completely ambulatory GENERAL: Patient is a well appearing female in no acute distress HEENT:  Sclerae anicteric.  Oropharynx clear and moist. No ulcerations or evidence of oropharyngeal candidiasis. Neck is supple.  NODES:  No cervical, supraclavicular, or axillary lymphadenopathy palpated.  BREAST EXAM:  Deferred. LUNGS:  Clear to auscultation bilaterally.  No wheezes or rhonchi. HEART:  Regular rate and rhythm. No murmur appreciated. ABDOMEN:  Soft, nontender.  Positive, normoactive bowel sounds. No organomegaly palpated. MSK:  No focal spinal tenderness to  palpation.  EXTREMITIES:  No peripheral edema.   SKIN:  Clear with no obvious rashes or skin changes. No nail dyscrasia. NEURO:  Nonfocal. Well oriented.  Appropriate affect.     LAB RESULTS:  CMP     Component Value Date/Time   NA 126 (L) 03/11/2016 1307   K 4.8 03/11/2016 1307   CL 98 (L) 02/18/2016 1204   CO2 24 03/11/2016 1307   GLUCOSE 119 03/11/2016 1307   BUN 10.1 03/11/2016 1307   CREATININE 0.7 03/11/2016 1307   CALCIUM 8.7 03/11/2016 1307   PROT 6.1 (L) 03/11/2016 1307   ALBUMIN 3.4 (L) 03/11/2016 1307   AST 32 03/11/2016 1307   ALT 22 03/11/2016 1307   ALKPHOS 420 (H) 03/11/2016 1307   BILITOT 0.34 03/11/2016 1307   GFRNONAA >60 02/18/2016 1204   GFRNONAA 63 01/19/2016 0800   GFRAA >60 02/18/2016 1204   GFRAA 72 01/19/2016 0800    INo results found for: SPEP, UPEP  Lab Results  Component Value Date   WBC 6.2 03/11/2016   NEUTROABS 3.7 03/11/2016   HGB 10.9 (L) 03/11/2016   HCT 32.5 (L) 03/11/2016   MCV 93.7 03/11/2016   PLT 210 03/11/2016      Chemistry      Component Value Date/Time   NA 126 (L) 03/11/2016 1307   K 4.8 03/11/2016 1307   CL 98 (L) 02/18/2016 1204   CO2 24 03/11/2016 1307   BUN 10.1 03/11/2016 1307   CREATININE 0.7 03/11/2016  1307   GLU 90 07/24/2015      Component Value Date/Time   CALCIUM 8.7 03/11/2016 1307   ALKPHOS 420 (H) 03/11/2016 1307   AST 32 03/11/2016 1307   ALT 22 03/11/2016 1307   BILITOT 0.34 03/11/2016 1307       Lab Results  Component Value Date   LABCA2 19 11/07/2007    No components found for: [id number]  No results for input(s): INR in the last 168 hours.  Urinalysis    Component Value Date/Time   COLORURINE STRAW (A) 02/18/2016 1214   APPEARANCEUR CLEAR 02/18/2016 1214   LABSPEC 1.015 03/11/2016 1307   PHURINE 6.0 03/11/2016 1307   PHURINE 6.0 02/18/2016 1214   GLUCOSEU Negative 03/11/2016 1307   HGBUR Negative 03/11/2016 1307   HGBUR NEGATIVE 02/18/2016 1214   BILIRUBINUR Negative 03/11/2016 1307   KETONESUR Negative 03/11/2016 1307   KETONESUR NEGATIVE 02/18/2016 1214   PROTEINUR < 30 03/11/2016 1307   PROTEINUR NEGATIVE 02/18/2016 1214   UROBILINOGEN 0.2 03/11/2016 1307   NITRITE Color Interference 03/11/2016 1307   NITRITE NEGATIVE 02/18/2016 1214   LEUKOCYTESUR Color Interference 03/11/2016 1307     STUDIES: Dg Chest 2 View  Result Date: 03/11/2016 CLINICAL DATA:  Chest pain.  History of metastatic breast carcinoma EXAM: CHEST  2 VIEW COMPARISON:  Chest radiograph February 11, 2016 and chest CT February 18, 2016 FINDINGS: There is a persistent right pleural effusion with patchy consolidation in the right base. There is scarring with pleural thickening in the left apex. There is scarring in the right upper lobe medially as well. Lungs elsewhere appear clear. Heart size and pulmonary vascularity are normal. No adenopathy. There is atherosclerotic calcification in the aorta. Bones are osteoporotic. There old healed rib fractures on the left. Multiple lytic appearing lesions are noted throughout the ribs and scapular regions. There is collapse of the T11 vertebral body. There is thoracolumbar dextroscoliosis. There are surgical clips the right axillary region. IMPRESSION:  Areas of consolidation in  the right base and right upper lobe near the apex. Asymmetric apical scarring on the left is stable. There is a persistent fairly small right pleural effusion. Findings felt represent bony metastases superimposed on osteoporosis. No adenopathy. There is aortic atherosclerosis. Cardiac silhouette within normal limits and stable. Electronically Signed   By: Lowella Grip III M.D.   On: 03/11/2016 14:23   Dg Chest 2 View  Result Date: 02/11/2016 CLINICAL DATA:  Breast cancer.  Left chest pain. EXAM: CHEST  2 VIEW COMPARISON:  10/22/2015 chest radiograph. FINDINGS: Stable cardiomediastinal silhouette with normal heart size and aortic atherosclerosis. No pneumothorax. Small right pleural effusion appears slightly increased. No left pleural effusion. No pulmonary edema. Stable biapical pleural-parenchymal scarring. Mild right basilar opacity, favor atelectasis. There are multiple nondisplaced lateral left sixth through ninth rib fractures, most of which appear new since 10/22/2015 and subacute given callus formation. IMPRESSION: 1. Multiple nondisplaced subacute lateral left sixth through ninth rib fractures, suspicious for pathologic fractures given the history of breast cancer . 2. Small right pleural effusion, slightly increased . 3. Mild right basilar lung opacity, favor atelectasis . 4. Aortic atherosclerosis. Electronically Signed   By: Ilona Sorrel M.D.   On: 02/11/2016 11:31   Dg Thoracic Spine 2 View  Result Date: 03/11/2016 CLINICAL DATA:  Pain.  Cough.  History metastatic breast cancer. EXAM: THORACIC SPINE 2 VIEWS COMPARISON:  03/11/2016. FINDINGS: Diffuse severe osteopenia and degenerative change. Thoracolumbar spine scoliosis. Lower thoracic spine compression fracture, unchanged. IMPRESSION: Diffuse severe osteopenia and degenerative change. Lower thoracic vertebral body prominent compression fracture, no change. Electronically Signed   By: Marcello Moores  Register   On:  03/11/2016 14:22   Ct Angio Chest Pe W And/or Wo Contrast  Result Date: 02/18/2016 CLINICAL DATA:  Dizziness onset 10 onset last evening without resolution. Hypertension. Diagnosed with rib fractures last week. Back pain. EXAM: CT ANGIOGRAPHY CHEST CT ABDOMEN AND PELVIS WITH CONTRAST TECHNIQUE: Multidetector CT imaging of the chest was performed using the standard protocol during bolus administration of intravenous contrast. Multiplanar CT image reconstructions and MIPs were obtained to evaluate the vascular anatomy. Multidetector CT imaging of the abdomen and pelvis was performed using the standard protocol during bolus administration of intravenous contrast. CONTRAST:  100 cc Isovue 370 IV COMPARISON:  Chest CT 10/27/2015, CT abdomen and pelvis 10/28/2015. FINDINGS: CTA CHEST FINDINGS Cardiovascular: Conventional branch pattern for the great vessels. Aortic atherosclerosis without dissection or aneurysm. No acute pulmonary embolus. Top normal size cardiac chambers without pericardial effusion or thickening. Mild coronary arteriosclerosis is noted. Mediastinum/Nodes: 1 cm short axis right lower paratracheal lymph node. No supraclavicular lymphadenopathy. No known thyroid nodules. Right axillary lymph node dissection clips are present. No axillary lymphadenopathy. Esophagus is unremarkable apart from some residual in the distal esophagus. Lungs/Pleura: Chronic biapical pleuroparenchymal areas of scarring and consolidation with bronchiectasis in the right upper lobe. Interval development of moderate sized right pleural effusion with adjacent compressive atelectasis. Nodular pleural-based thickening with adjacent parenchymal nodularity in the right middle lobe and right lower lobe, series 9, image 67 and 73 is identified possibly postinflammatory or postinfectious in etiology though neoplastic change is not entirely excluded. Right middle lobe subpleural nodular density measures up to 8 mm and in the right lower  lobe 13 mm. Musculoskeletal: Stable T11 compression deformity. Diffuse patchy osteopenic appearance of bony thorax. Review of the MIP images confirms the above findings. CT ABDOMEN and PELVIS FINDINGS Hepatobiliary: Tiny left hepatic granuloma. No biliary dilatation or space-occupying mass. Gallbladder is unremarkable. Pancreas:  Atrophic appearance of the pancreas without inflammation. No ductal dilatation or mass. Spleen: No splenomegaly. Slight heterogeneous enhancement may be due to timing of contrast. Adrenals/Urinary Tract: Chronically thickened appearance of the adrenal glands left-greater-than-right. Mild proximal hydroureteronephrosis without obstructing source. Small parapelvic renal cysts are seen bilaterally. Mild enhancement of the urothelium of the proximal ureters which be secondary to a urinary tract infection. No evidence of pyelonephritis. No renal mass. Urinary bladder is physiologically distended. There is probable mild pelvic floor relaxation. Stomach/Bowel: Contracted stomach. No acute bowel inflammation or obstruction. Appendix not identified but no pericecal inflammation is apparent. Vascular/Lymphatic: Aortic atherosclerosis without evidence of aneurysm or dissection. No lymphadenopathy in the abdomen and pelvis. Reproductive: Atrophic uterus and ovaries.  No adnexal mass. Other: Trace free fluid in the pelvis likely physiologic. No pneumoperitoneum. Musculoskeletal: Osteopenic appearance of the axial and appendicular skeleton with dextroscoliosis of the lumbar spine. Marked compression deformity T11 with loss of approximately 80-90% of the height is unchanged. Status post right hip arthroplasty. Review of the MIP images confirms the above findings. IMPRESSION: 1. No acute pulmonary embolus, aortic dissection nor aneurysm. Mild coronary arteriosclerosis. 2. Moderate new right pleural effusion with adjacent atelectasis. Pleural-based right middle lobe nodular density with adjacent  intraparenchymal nodular opacities are nonspecific but may represent a post infectious or inflammatory etiology. Neoplasm can not be entirely excluded. 3. Chronic stable bilateral upper lobe pulmonary consolidations with bronchiectasis bilaterally with scarring. 4. New mild proximal hydroureteronephrosis with mild enhancement of the proximal urothelium of the ureters. Urinary tract infection might account this appearance. No calculus or urothelial mass is identified. 5. Chronic stable T11 compression. No suspicious osseous abnormalities. Electronically Signed   By: Ashley Royalty M.D.   On: 02/18/2016 14:16   Ct Abdomen Pelvis W Contrast  Result Date: 02/18/2016 CLINICAL DATA:  Dizziness onset 10 onset last evening without resolution. Hypertension. Diagnosed with rib fractures last week. Back pain. EXAM: CT ANGIOGRAPHY CHEST CT ABDOMEN AND PELVIS WITH CONTRAST TECHNIQUE: Multidetector CT imaging of the chest was performed using the standard protocol during bolus administration of intravenous contrast. Multiplanar CT image reconstructions and MIPs were obtained to evaluate the vascular anatomy. Multidetector CT imaging of the abdomen and pelvis was performed using the standard protocol during bolus administration of intravenous contrast. CONTRAST:  100 cc Isovue 370 IV COMPARISON:  Chest CT 10/27/2015, CT abdomen and pelvis 10/28/2015. FINDINGS: CTA CHEST FINDINGS Cardiovascular: Conventional branch pattern for the great vessels. Aortic atherosclerosis without dissection or aneurysm. No acute pulmonary embolus. Top normal size cardiac chambers without pericardial effusion or thickening. Mild coronary arteriosclerosis is noted. Mediastinum/Nodes: 1 cm short axis right lower paratracheal lymph node. No supraclavicular lymphadenopathy. No known thyroid nodules. Right axillary lymph node dissection clips are present. No axillary lymphadenopathy. Esophagus is unremarkable apart from some residual in the distal esophagus.  Lungs/Pleura: Chronic biapical pleuroparenchymal areas of scarring and consolidation with bronchiectasis in the right upper lobe. Interval development of moderate sized right pleural effusion with adjacent compressive atelectasis. Nodular pleural-based thickening with adjacent parenchymal nodularity in the right middle lobe and right lower lobe, series 9, image 67 and 73 is identified possibly postinflammatory or postinfectious in etiology though neoplastic change is not entirely excluded. Right middle lobe subpleural nodular density measures up to 8 mm and in the right lower lobe 13 mm. Musculoskeletal: Stable T11 compression deformity. Diffuse patchy osteopenic appearance of bony thorax. Review of the MIP images confirms the above findings. CT ABDOMEN and PELVIS FINDINGS Hepatobiliary: Tiny left hepatic granuloma. No  biliary dilatation or space-occupying mass. Gallbladder is unremarkable. Pancreas: Atrophic appearance of the pancreas without inflammation. No ductal dilatation or mass. Spleen: No splenomegaly. Slight heterogeneous enhancement may be due to timing of contrast. Adrenals/Urinary Tract: Chronically thickened appearance of the adrenal glands left-greater-than-right. Mild proximal hydroureteronephrosis without obstructing source. Small parapelvic renal cysts are seen bilaterally. Mild enhancement of the urothelium of the proximal ureters which be secondary to a urinary tract infection. No evidence of pyelonephritis. No renal mass. Urinary bladder is physiologically distended. There is probable mild pelvic floor relaxation. Stomach/Bowel: Contracted stomach. No acute bowel inflammation or obstruction. Appendix not identified but no pericecal inflammation is apparent. Vascular/Lymphatic: Aortic atherosclerosis without evidence of aneurysm or dissection. No lymphadenopathy in the abdomen and pelvis. Reproductive: Atrophic uterus and ovaries.  No adnexal mass. Other: Trace free fluid in the pelvis likely  physiologic. No pneumoperitoneum. Musculoskeletal: Osteopenic appearance of the axial and appendicular skeleton with dextroscoliosis of the lumbar spine. Marked compression deformity T11 with loss of approximately 80-90% of the height is unchanged. Status post right hip arthroplasty. Review of the MIP images confirms the above findings. IMPRESSION: 1. No acute pulmonary embolus, aortic dissection nor aneurysm. Mild coronary arteriosclerosis. 2. Moderate new right pleural effusion with adjacent atelectasis. Pleural-based right middle lobe nodular density with adjacent intraparenchymal nodular opacities are nonspecific but may represent a post infectious or inflammatory etiology. Neoplasm can not be entirely excluded. 3. Chronic stable bilateral upper lobe pulmonary consolidations with bronchiectasis bilaterally with scarring. 4. New mild proximal hydroureteronephrosis with mild enhancement of the proximal urothelium of the ureters. Urinary tract infection might account this appearance. No calculus or urothelial mass is identified. 5. Chronic stable T11 compression. No suspicious osseous abnormalities. Electronically Signed   By: Ashley Royalty M.D.   On: 02/18/2016 14:16    ELIGIBLE FOR AVAILABLE RESEARCH PROTOCOL: no  ASSESSMENT: 51 y.o. [place] woman residing at friends homes [place]  (1) status post right lumpectomy on 10/27/2004: ILC, grade 1, ER+(49%), PR (6%), Ki-67 3%, HER-2 negative, T2N2, treated with radiation and tamoxifen for 5 years  (a) Also status post left lumpectomy for ductal carcinoma in situ  METASTATIC DISEASE: November 2017 (2) chest CT scan and PET scan obtained October 2017 show a hot 1.2 cm right paratracheal lymph node, a rim of activity along the pleural surface of the a a zygo-esophageal region, and bilateral adrenal metastases, left greater than right   (a) biopsy of the left adrenal gland 12/03/2015 shows metastatic carcinoma, estrogen and progesterone receptor positive, HER-2  nonamplified  (3) tamoxifen started 01/20/2015  PLAN: Chest xray done to evaluate this pain.  Patient returned after the xray was completed and there is concern over bony metastasis being in the locations of the pain (scapula and spine), over top of her osteoporosis making it challenging to see.  Her sodium is slightly lower than last week (managed by her PCP), which is also a sign of possible progression.  I prescribed Athziry some Tramadol 25-36m po Q6H PRN and we will get a PET scan on her ASAP.    A total of (30) minutes of face-to-face time was spent with this patient with greater than 50% of that time in counseling and care-coordination.   LCharlestine Massed NP   03/11/2016 3:57 PM Medical Oncology and Hematology [facility] [address]Tel. [phone]   Fax. [phone]

## 2016-03-13 LAB — URINE CULTURE

## 2016-03-15 ENCOUNTER — Other Ambulatory Visit: Payer: Medicare Other

## 2016-03-16 ENCOUNTER — Encounter (HOSPITAL_COMMUNITY)
Admission: RE | Admit: 2016-03-16 | Discharge: 2016-03-16 | Disposition: A | Discharge status: Home / Self Care | Payer: Medicare Other | Source: Ambulatory Visit | Attending: Adult Health | Admitting: Adult Health

## 2016-03-16 ENCOUNTER — Other Ambulatory Visit: Payer: Medicare Other

## 2016-03-16 ENCOUNTER — Telehealth: Payer: Self-pay | Admitting: Oncology

## 2016-03-16 ENCOUNTER — Other Ambulatory Visit: Payer: Self-pay | Admitting: Oncology

## 2016-03-16 DIAGNOSIS — R918 Other nonspecific abnormal finding of lung field: Secondary | ICD-10-CM | POA: Diagnosis not present

## 2016-03-16 DIAGNOSIS — C50919 Malignant neoplasm of unspecified site of unspecified female breast: Secondary | ICD-10-CM

## 2016-03-16 DIAGNOSIS — M546 Pain in thoracic spine: Secondary | ICD-10-CM | POA: Diagnosis not present

## 2016-03-16 DIAGNOSIS — C7972 Secondary malignant neoplasm of left adrenal gland: Secondary | ICD-10-CM

## 2016-03-16 DIAGNOSIS — C7970 Secondary malignant neoplasm of unspecified adrenal gland: Secondary | ICD-10-CM | POA: Diagnosis not present

## 2016-03-16 LAB — GLUCOSE, CAPILLARY: Glucose-Capillary: 126 mg/dL — ABNORMAL HIGH (ref 65–99)

## 2016-03-16 MED ORDER — FLUDEOXYGLUCOSE F - 18 (FDG) INJECTION: 6.6000 | Freq: Once | INTRAVENOUS | Status: DC | PRN

## 2016-03-16 NOTE — Progress Notes (Signed)
I called the patient's daughter today to give her the results of the Pet SCAN

## 2016-03-16 NOTE — Telephone Encounter (Signed)
Confirmed 3/12 appt with pt dtr per LOS

## 2016-03-18 ENCOUNTER — Telehealth: Payer: Self-pay

## 2016-03-18 DIAGNOSIS — H353112 Nonexudative age-related macular degeneration, right eye, intermediate dry stage: Secondary | ICD-10-CM | POA: Diagnosis not present

## 2016-03-18 DIAGNOSIS — H353223 Exudative age-related macular degeneration, left eye, with inactive scar: Secondary | ICD-10-CM | POA: Diagnosis not present

## 2016-03-18 DIAGNOSIS — H43813 Vitreous degeneration, bilateral: Secondary | ICD-10-CM | POA: Diagnosis not present

## 2016-03-18 DIAGNOSIS — Z961 Presence of intraocular lens: Secondary | ICD-10-CM | POA: Diagnosis not present

## 2016-03-18 DIAGNOSIS — H3562 Retinal hemorrhage, left eye: Secondary | ICD-10-CM | POA: Diagnosis not present

## 2016-03-18 DIAGNOSIS — H35373 Puckering of macula, bilateral: Secondary | ICD-10-CM | POA: Diagnosis not present

## 2016-03-18 NOTE — Telephone Encounter (Signed)
Daughter called that pt is using tramadol and it is helping her pain. It has also caused constipation. No BM in 3-4 days. Pt is using miralax nightly for last 2 nights. Discussed using colace. Last time she used colace she had bad diarrhea. Discussed getting her bowels moving with colace and then increasing the miralax to bid prn to prevent impaction. Daughter was appreciative of suggestions.

## 2016-03-22 ENCOUNTER — Other Ambulatory Visit (HOSPITAL_BASED_OUTPATIENT_CLINIC_OR_DEPARTMENT_OTHER): Payer: Medicare Other

## 2016-03-22 ENCOUNTER — Ambulatory Visit (HOSPITAL_BASED_OUTPATIENT_CLINIC_OR_DEPARTMENT_OTHER): Payer: Medicare Other | Admitting: Oncology

## 2016-03-22 VITALS — BP 140/63 | HR 83 | Temp 97.4°F | Resp 17 | Ht 61.0 in | Wt 136.1 lb

## 2016-03-22 DIAGNOSIS — M810 Age-related osteoporosis without current pathological fracture: Secondary | ICD-10-CM | POA: Diagnosis not present

## 2016-03-22 DIAGNOSIS — Z79811 Long term (current) use of aromatase inhibitors: Secondary | ICD-10-CM | POA: Diagnosis not present

## 2016-03-22 DIAGNOSIS — C7972 Secondary malignant neoplasm of left adrenal gland: Secondary | ICD-10-CM | POA: Diagnosis not present

## 2016-03-22 DIAGNOSIS — Z170 Estrogen receptor positive status [ER+]: Secondary | ICD-10-CM | POA: Diagnosis not present

## 2016-03-22 DIAGNOSIS — C50911 Malignant neoplasm of unspecified site of right female breast: Secondary | ICD-10-CM

## 2016-03-22 DIAGNOSIS — C7951 Secondary malignant neoplasm of bone: Secondary | ICD-10-CM | POA: Insufficient documentation

## 2016-03-22 DIAGNOSIS — C50919 Malignant neoplasm of unspecified site of unspecified female breast: Secondary | ICD-10-CM

## 2016-03-22 DIAGNOSIS — G893 Neoplasm related pain (acute) (chronic): Secondary | ICD-10-CM | POA: Insufficient documentation

## 2016-03-22 LAB — COMPREHENSIVE METABOLIC PANEL
ALBUMIN: 3.1 g/dL — AB (ref 3.5–5.0)
ALK PHOS: 358 U/L — AB (ref 40–150)
ALT: 45 U/L (ref 0–55)
ANION GAP: 7 meq/L (ref 3–11)
AST: 49 U/L — ABNORMAL HIGH (ref 5–34)
BUN: 11.3 mg/dL (ref 7.0–26.0)
CO2: 26 mEq/L (ref 22–29)
CREATININE: 0.8 mg/dL (ref 0.6–1.1)
Calcium: 8.3 mg/dL — ABNORMAL LOW (ref 8.4–10.4)
Chloride: 100 mEq/L (ref 98–109)
EGFR: 69 mL/min/{1.73_m2} — AB (ref >90)
GLUCOSE: 105 mg/dL (ref 70–140)
Potassium: 4.7 mEq/L (ref 3.5–5.1)
SODIUM: 133 meq/L — AB (ref 136–145)
TOTAL PROTEIN: 5.7 g/dL — AB (ref 6.4–8.3)
Total Bilirubin: 0.29 mg/dL (ref 0.20–1.20)

## 2016-03-22 LAB — CBC WITH DIFFERENTIAL/PLATELET
BASO%: 0.8 % (ref 0.0–2.0)
Basophils Absolute: 0.1 10*3/uL (ref 0.0–0.1)
EOS ABS: 0.2 10*3/uL (ref 0.0–0.5)
EOS%: 3.2 % (ref 0.0–7.0)
HCT: 30.9 % — ABNORMAL LOW (ref 34.8–46.6)
HEMOGLOBIN: 10.2 g/dL — AB (ref 11.6–15.9)
LYMPH%: 31.8 % (ref 14.0–49.7)
MCH: 30.8 pg (ref 25.1–34.0)
MCHC: 32.9 g/dL (ref 31.5–36.0)
MCV: 93.6 fL (ref 79.5–101.0)
MONO#: 0.8 10*3/uL (ref 0.1–0.9)
MONO%: 12.6 % (ref 0.0–14.0)
NEUT%: 51.6 % (ref 38.4–76.8)
NEUTROS ABS: 3.5 10*3/uL (ref 1.5–6.5)
PLATELETS: 204 10*3/uL (ref 145–400)
RBC: 3.3 10*6/uL — AB (ref 3.70–5.45)
RDW: 15.9 % — AB (ref 11.2–14.5)
WBC: 6.7 10*3/uL (ref 3.9–10.3)
lymph#: 2.1 10*3/uL (ref 0.9–3.3)

## 2016-03-22 MED ORDER — ANASTROZOLE 1 MG PO TABS: 1.0000 mg | ORAL_TABLET | Freq: Every day | ORAL | 4 refills | Status: DC

## 2016-03-22 NOTE — Progress Notes (Signed)
The drugs is already [facility]  Telephone:([phone] Fax:([phone]     ID: SRI CLEGG DOB: 1920-12-06  MR#: [id number]  EXB#:[id number]  Patient Care Team: Estill Dooms, MD as PCP - General (Internal Medicine) Celada Man Otho Darner, NP as Nurse Practitioner (Nurse Practitioner) Shon Hough, MD as Consulting Physician (Ophthalmology) Molli Posey, MD as Consulting Physician (Obstetrics and Gynecology) Gerarda Fraction, MD as Consulting Physician (Ophthalmology) Lorelle Gibbs, MD as Consulting Physician (Radiology) Melrose Nakayama, MD as Consulting Physician (Orthopedic Surgery) Gerarda Fraction, MD as Referring Physician (Ophthalmology) Ardis Hughs, MD as Attending Physician (Urology) Chauncey Cruel, MD as Consulting Physician (Oncology) Chauncey Cruel, MD OTHER MD:  CHIEF COMPLAINT: Carcinoma metastatic to left adrenal gland  CURRENT TREATMENT:  Anastrozole, denosumab/Xgeva  INTERVAL HISTORY:  Cassandra Glenn returns today accompanied by her daughter for further follow-up of various metastatic estrogen receptor positive breast cancer. For the last several weeks she has complained of increasing back pain. She was evaluated with plain films of the lumbar spine and a chest x-ray. There were some broken ribs on the left side which appeared old. It was not clear whether she was having increasing bone metastases. There was an old compression fracture. Accordingly she was scheduled for a PET scan and this shows a lesion in the right lung which is now more noticeable than before and measures 1.3 cm. There are many small lymph nodes that were measured and don't appear all that different and the bone lesions, which of course are very hard to follow radiologically, show decreased standard uptake values.  Cassandra Glenn's pain is moderately too well controlled on tramadol which she takes 4 times a day. This does constipate her. She is using MiraLAX once or  twice daily with good success.  They're here today to consider intensification of treatment   REVIEW OF SYSTEMS: Cassandra Glenn feels "like him 42", and has a slightly decreased functional status. She is going to be moving on to assisted living within the next few days. She still does a little bit of housework. The back pain is a little bit worse at night. Aside from these issues, she had some irritation of the right nipple yesterday but not today. She is having problems with her bras, which are too tight. A detailed review of systems today was otherwise stable.  BREAST CANCER HISTORY: From the original intake note:  I saw Cassandra Glenn approximately approximately 13 years ago when she was diagnosed with right-sided invasive breast cancer, which was treated with radiation and tamoxifen for 5 years. She also had a noninvasive breast cancer removed from the left breast at the same time.  More recently Cassandra Glenn had a chest x-ray 10/22/2015 for evaluation of a cough. The film showed a variety of incidental findings including atherosclerosis of the thoracic aorta and an old lower thoracic compression fracture. There was also however a right apical density which required further evaluation. Accordingly on 10/27/2015 she had a chest CT scan which found chronic areas of scarring in both lungs, not significantly changed from November 2015. However right middle lobe pleural thickening appeared worse, and there was increased nodular pleural thickening along the azygo-esophageal recess, suggestive of an indolent mesothelioma. There was a small cluster of nodules at the medial right lung base which was new from the prior study. There were no bony metastases but there were multiple chronic compression deformities and degenerative change.   Given these results, a PET scan was obtained 11/07/2015. There was a hypermetabolic  right paratracheal lymph node measuring 1.2 cm, but no additional mediastinal adenopathy. Along the pleural  surface of the azygo-esophageal region there was increased metabolic activity with an SUV max of 20.9. There was also intense metabolic activity associated with a thickened left adrenal gland, with an SUV max of 10.8. There was some hypermetabolic activity associated with the right adrenal gland. There were again no bony metastatic disease findings.  With this information the patient returns for further evaluation and treatment.   PAST MEDICAL HISTORY: Past Medical History:  Diagnosis Date  . Abnormalities of the hair 02/29/2012  . Arthritis   . Cancer ([place])   . Candidiasis of skin and nails 03/19/2011  . Closed fracture of base of neck of femur ([place]) 03/18/2011  . Contact dermatitis and other eczema, due to unspecified cause 05/07/2011  . Depression 07/23/2014  . Diverticulosis of colon (without mention of hemorrhage) 03/19/2011  . Edema 05/07/2011  . HOH (hard of hearing)   . Hypopotassemia 03/26/2011  . Hyposmolality and/or hyponatremia 03/19/2011  . Hypotension, unspecified 03/18/2011  . Impaired fasting glucose 03/19/2011  . Insomnia, unspecified 03/19/2011  . Kidney infection   . Lumbago 03/19/2011  . Macular degeneration   . Malignant neoplasm of breast (female), unspecified site 10/28/2002  . Osteoporosis, unspecified 03/19/2011  . Other malaise and fatigue 03/19/2011  . Pain in joint, pelvic region and thigh 03/19/2011  . Rib fractures   . Right bundle branch block 08/30/2011  . Shortness of breath   . Spasm of muscle 03/19/2011  . Spinal stenosis, lumbar region, with neurogenic claudication 11/02/2011  . Trigger finger (acquired) 11/02/2011  . Unspecified constipation 03/19/2011  . Vaginitis and vulvovaginitis 02/29/2012  . Vertigo 08/26/2015    PAST SURGICAL HISTORY: Past Surgical History:  Procedure Laterality Date  . BREAST SURGERY Bilateral 2005-10/27/2004   lumpectomy- Streck,MD  . CATARACT EXTRACTION  2010   bialteral  . EYE SURGERY     cataract  .  FRACTURE SURGERY Left 09/1980   ankle  . HARDWARE REMOVAL Right 05/28/2014   Procedure: HARDWARE REMOVAL;  Surgeon: Melrose Nakayama, MD;  Location: [facility];  Service: Orthopedics;  Laterality: Right;  . HIP PINNING,CANNULATED  03/08/2011   Procedure: CANNULATED HIP PINNING;  Surgeon: Johnn Hai, MD;  Location: [facility];  Service: Orthopedics;  Laterality: Right;  . ORIF HIP FRACTURE Right 03/08/2011   Bean,MD  . SKIN CANCER EXCISION Left 10/12/2012   lower leg Dr. [place]  . SKIN LESION EXCISION Right 02/04/2011   Abdomen lesion spongiotic dermatitis-Taffeen, MD  . SQUAMOUS CELL CARCINOMA EXCISION Right 02/04/2011   forearmSyble Creek, MD  . TONSILLECTOMY    . TOTAL HIP ARTHROPLASTY Right 05/28/2014   Procedure: TOTAL HIP ARTHROPLASTY ANTERIOR APPROACH;  Surgeon: Melrose Nakayama, MD;  Location: [facility];  Service: Orthopedics;  Laterality: Right;    FAMILY HISTORY Family History  Problem Relation Age of Onset  . Heart disease Mother   . Cancer Father   . Emphysema Brother   . Alzheimer's disease Brother   The patient's father died at the age of 15 with colon cancer. The patient's mother died at the age of 9 from heart disease the patient had 2 brothers, no sisters. Both brothers have died, one from emphysema and one from dementia.  GYNECOLOGIC HISTORY:  No LMP recorded. Patient is postmenopausal. Menarche age 83, first live birth age 62, the patient is [place] P2. She stopped having periods around age 60. She never took hormone replacement.  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 She is widowed. She lives in independent living section of friend's home [place]. Her daughter Cassandra Glenn lives in [place] (actually [place]) where she teaches. Daughter Cassandra Glenn lives in [place] and is Web designer at [employer]. The patient has 5 grandchildren and 2 step grandchildren. She has one great grandchild and one on  the way. She attends [employer]    ADVANCED DIRECTIVES: Naava has named both her daughters as joint health care power of attorney. She tells me she has a living will "somewhere in the house" and has a DO NOT RESUSCITATE order written at friends homes.   HEALTH MAINTENANCE: Social History  Substance Use Topics  . Smoking status: Never Smoker  . Smokeless tobacco: Never Used  . Alcohol use 0.0 oz/week     Comment: occasional glass     Colonoscopy:  PAP:  Bone density:   No Known Allergies  Current Outpatient Prescriptions  Medication Sig Dispense Refill  . acetaminophen (TYLENOL) 500 MG tablet Take 1,000 mg by mouth every 6 (six) hours as needed for moderate pain.     Marland Kitchen ALPRAZolam (XANAX) 0.5 MG tablet One up to 3 times daily for anxiety (Patient taking differently: One up to 3 times daily for anxiety) 100 tablet 0  . aspirin 81 MG chewable tablet Chew 81 mg by mouth every morning.     . Bilberry 1000 MG CAPS Take 1,000 mg by mouth every morning.     . Calcium Carbonate-Vitamin D (CALCIUM 600/VITAMIN D PO) Take 600 mg by mouth 2 (two) times daily.     . Calcium Polycarbophil (FIBERCON PO) Take 1 tablet by mouth 2 (two) times daily.     . cholecalciferol (VITAMIN D) 400 UNITS TABS Take 400 Units by mouth every morning.     Marland Kitchen CRANBERRY EXTRACT PO Take 1 tablet by mouth every morning.     . fish oil-omega-3 fatty acids 1000 MG capsule Take 1 g by mouth every morning.     . Glucosamine-Chondroitin (GLUCOSAMINE CHONDR COMPLEX PO) Take 1 tablet by mouth 2 (two) times daily. for joints    . guaiFENesin (MUCINEX) 600 MG 12 hr tablet Take 600 mg by mouth 2 (two) times daily.    Marland Kitchen ibuprofen (ADVIL,MOTRIN) 200 MG tablet Take 200 mg by mouth. Take 2 tablets as needed for inflammatory pain    . meclizine (ANTIVERT) 25 MG tablet Take 1 tablet (25 mg total) by mouth 3 (three) times daily as needed for dizziness. 30 tablet 0  . Multiple Vitamins-Minerals (PRESERVISION/LUTEIN) CAPS Take  1 capsule by mouth 2 (two) times daily.    Marland Kitchen nystatin (MYCOSTATIN/NYSTOP) powder Apply to affected skin daily to treat yeast (Patient taking differently: Apply to affected skin daily as needed to treat yeast) 45 g 4  . Probiotic Product (CULTURELLE PRO-WELL PO) Take by mouth. Take one probiotic tablet in morning    . tamoxifen (NOLVADEX) 20 MG tablet Take 20 mg by mouth daily.    . traMADol (ULTRAM) 50 MG tablet Take 0.5-1 tablets (25-50 mg total) by mouth every 6 (six) hours as needed. 60 tablet 0  . trimethoprim (TRIMPEX) 100 MG tablet Take 100 mg by mouth at bedtime.    Marland Kitchen zolpidem (AMBIEN) 5 MG tablet Take one half tablet at bedtime if needed for rest 15 tablet 5   No current facility-administered medications for this visit.     OBJECTIVE: Elderly white woman who  appears stated age  34:   03/22/16 1456  BP: 140/63  Pulse: 83  Resp: 17  Temp: 97.4 F (36.3 C)     Body mass index is 25.72 kg/m.    ECOG FS:1 - Symptomatic but completely ambulatory   Sclerae unicteric, pupils round and equal Oropharynx clear, dentition in excellent repair No cervical or supraclavicular adenopathy Lungs no rales or rhonchi Heart regular rate and rhythm Abd soft, nontender, positive bowel sounds MSK mild kyphosis but no focal spinal tenderness to store on palpation Neuro: nonfocal, well oriented, appropriate affect Breasts: Neither nipple shows any change of significant. There is mild erythema in the inferior breast fold bilaterally   LAB RESULTS:  CMP     Component Value Date/Time   NA 126 (L) 03/11/2016 1307   K 4.8 03/11/2016 1307   CL 98 (L) 02/18/2016 1204   CO2 24 03/11/2016 1307   GLUCOSE 119 03/11/2016 1307   BUN 10.1 03/11/2016 1307   CREATININE 0.7 03/11/2016 1307   CALCIUM 8.7 03/11/2016 1307   PROT 6.1 (L) 03/11/2016 1307   ALBUMIN 3.4 (L) 03/11/2016 1307   AST 32 03/11/2016 1307   ALT 22 03/11/2016 1307   ALKPHOS 420 (H) 03/11/2016 1307   BILITOT 0.34 03/11/2016 1307     GFRNONAA >60 02/18/2016 1204   GFRNONAA 63 01/19/2016 0800   GFRAA >60 02/18/2016 1204   GFRAA 72 01/19/2016 0800    INo results found for: SPEP, UPEP  Lab Results  Component Value Date   WBC 6.7 03/22/2016   NEUTROABS 3.5 03/22/2016   HGB 10.2 (L) 03/22/2016   HCT 30.9 (L) 03/22/2016   MCV 93.6 03/22/2016   PLT 204 03/22/2016      Chemistry      Component Value Date/Time   NA 126 (L) 03/11/2016 1307   K 4.8 03/11/2016 1307   CL 98 (L) 02/18/2016 1204   CO2 24 03/11/2016 1307   BUN 10.1 03/11/2016 1307   CREATININE 0.7 03/11/2016 1307   GLU 90 07/24/2015      Component Value Date/Time   CALCIUM 8.7 03/11/2016 1307   ALKPHOS 420 (H) 03/11/2016 1307   AST 32 03/11/2016 1307   ALT 22 03/11/2016 1307   BILITOT 0.34 03/11/2016 1307       Lab Results  Component Value Date   LABCA2 19 11/07/2007    No components found for: TDVVO160  No results for input(s): INR in the last 168 hours.  Urinalysis    Component Value Date/Time   COLORURINE STRAW (A) 02/18/2016 1214   APPEARANCEUR CLEAR 02/18/2016 1214   LABSPEC 1.015 03/11/2016 1307   PHURINE 6.0 03/11/2016 1307   PHURINE 6.0 02/18/2016 1214   GLUCOSEU Negative 03/11/2016 1307   HGBUR Negative 03/11/2016 1307   HGBUR NEGATIVE 02/18/2016 1214   BILIRUBINUR Negative 03/11/2016 1307   KETONESUR Negative 03/11/2016 1307   KETONESUR NEGATIVE 02/18/2016 1214   PROTEINUR < 30 03/11/2016 1307   PROTEINUR NEGATIVE 02/18/2016 1214   UROBILINOGEN 0.2 03/11/2016 1307   NITRITE Color Interference 03/11/2016 1307   NITRITE NEGATIVE 02/18/2016 1214   LEUKOCYTESUR Color Interference 03/11/2016 1307     STUDIES: Dg Chest 2 View  Result Date: 03/11/2016 CLINICAL DATA:  Chest pain.  History of metastatic breast carcinoma EXAM: CHEST  2 VIEW COMPARISON:  Chest radiograph February 11, 2016 and chest CT February 18, 2016 FINDINGS: There is a persistent right pleural effusion with patchy consolidation in the right base. There  is scarring with pleural  thickening in the left apex. There is scarring in the right upper lobe medially as well. Lungs elsewhere appear clear. Heart size and pulmonary vascularity are normal. No adenopathy. There is atherosclerotic calcification in the aorta. Bones are osteoporotic. There old healed rib fractures on the left. Multiple lytic appearing lesions are noted throughout the ribs and scapular regions. There is collapse of the T11 vertebral body. There is thoracolumbar dextroscoliosis. There are surgical clips the right axillary region. IMPRESSION: Areas of consolidation in the right base and right upper lobe near the apex. Asymmetric apical scarring on the left is stable. There is a persistent fairly small right pleural effusion. Findings felt represent bony metastases superimposed on osteoporosis. No adenopathy. There is aortic atherosclerosis. Cardiac silhouette within normal limits and stable. Electronically Signed   By: Lowella Grip III M.D.   On: 03/11/2016 14:23   Dg Thoracic Spine 2 View  Result Date: 03/11/2016 CLINICAL DATA:  Pain.  Cough.  History metastatic breast cancer. EXAM: THORACIC SPINE 2 VIEWS COMPARISON:  03/11/2016. FINDINGS: Diffuse severe osteopenia and degenerative change. Thoracolumbar spine scoliosis. Lower thoracic spine compression fracture, unchanged. IMPRESSION: Diffuse severe osteopenia and degenerative change. Lower thoracic vertebral body prominent compression fracture, no change. Electronically Signed   By: Marcello Moores  Register   On: 03/11/2016 14:22   Nm Pet Image Restag (ps) Skull Base To Thigh  Result Date: 03/16/2016 CLINICAL DATA:  Subsequent treatment strategy for metastatic breast cancer. EXAM: NUCLEAR MEDICINE PET SKULL BASE TO THIGH TECHNIQUE: 6.6 mCi F-18 FDG was injected intravenously. Full-ring PET imaging was performed from the skull base to thigh after the radiotracer. CT data was obtained and used for attenuation correction and anatomic localization.  FASTING BLOOD GLUCOSE:  Value: 124 mg/dl COMPARISON:  CT chest/abdomen/pelvis dated 02/18/2016 FINDINGS: NECK No hypermetabolic lymph nodes in the neck. CHEST 13 mm nodule along the anterior right lung base (series 8/image 51), max SUV 3.7, metastasis not excluded. Moderate right pleural effusion with associated right lower lobe atelectasis. Subpleural scarring with volume loss in the anterior right middle lobe (series 8/image 30) and medial right lower lobe (series 8/ image 47), non FDG avid. Biapical pleural-parenchymal scarring/radiation changes (series 8/image 11). Small left axillary nodes measuring up to 6 mm short axis. Hypermetabolic mediastinal lymphadenopathy, including: --9 mm short axis low right paratracheal node (series 4/ image 61), max SUV 4.2 (previously 8.7) --8 mm short axis subcarinal node (series 4/image 65), max SUV 4.4 (previously 9.9) --6 mm short axis right pericardial node (series 4/ image 70), max SUV 4.9 (previously 4.9) ABDOMEN/PELVIS 1.8 x 1.1 cm left adrenal nodule (series 4/image 99), max SUV 6.0 (previously 11.8), corresponding to known adrenal metastasis. Right adrenal gland demonstrates max SUV 4.3 (previously 8.4). No abnormal hypermetabolic activity within the liver, pancreas, or spleen. No hypermetabolic lymph nodes in the abdomen or pelvis. 5 mm short axis right pelvic sidewall node (series 4/image 146), non FDG avid (previously max SUV 5.6). Atherosclerotic calcifications the abdominal aorta and branch vessels. Trace right pelvic ascites. SKELETON Degenerative changes of the visualized thoracolumbar spine. Right hip arthroplasty. Multifocal/ diffuse lytic/sclerotic metastases are suspected on CT. Additionally, there are suspected multiple rib fractures, some of which are acute/ subacute (series 4/images 40, 49, 54, 60, and 78) and some of which are subacute/chronic (series 4/ images 86 and 92). Multifocal hypermetabolism is present involving the bilateral ribs, max SUV 4.9 on  the left and 3.5 on the right, although it is unclear to what extent this reflects trauma versus neoplasm. IMPRESSION:  13 mm pleural-based nodule the anterior right lung base, suspicious for pulmonary metastasis. Mild mediastinal nodal metastases and bilateral adrenal metastases. Multifocal osseous metastases, with superimposed acute/subacute pathologic fractures involving multiple left ribs. When compared to the prior, the suspected pulmonary metastasis was not clearly evident on PET, although this may be technical. Otherwise, the hypermetabolism of most lesions has actually decreased. Electronically Signed   By: Julian Hy M.D.   On: 03/16/2016 16:27    ELIGIBLE FOR AVAILABLE RESEARCH PROTOCOL: no  ASSESSMENT: 83 y.o. [place] woman residing at friends homes [place]  (1) status post right lumpectomy on 10/27/2004 for an invasive lobular carcinoma, grade 1, estrogen and progesterone receptor positive, Ki-67 3%, HER-2 negative, and so T2N2, treated with radiation and tamoxifen for 5 years  (a) Also status post left lumpectomy for ductal carcinoma in situ  METASTATIC DISEASE: November 2017 (2) chest CT scan and PET scan obtained October 2017 show a hot 1.2 cm right paratracheal lymph node, a rim of activity along the pleural surface of the a a zygo-esophageal region, and bilateral adrenal metastases, left greater than right   (a) biopsy of the left adrenal gland 12/03/2015 shows metastatic carcinoma, estrogen and progesterone receptor positive, HER-2 nonamplified  (3) tamoxifen started 01/20/2015, discontinued March 2018 with concerns regarding progression  (4) anastrozole started March 2018  (a) Xgeva to be started 03/29/2016, repeated monthly  PLAN: I spent approximately 40 minutes with Cassandra Glenn and her daughter with most of that time spent discussing her complex problems. The reason we started her on tamoxifen to start with was concerns regarding her osteoporosis. At this point, while it is  difficult to be 100% sure, there appears to be slight progression of her disease, as measured by repeat PET scan. This is perhaps the most obvious in the lung lesion, although the change is not marked.   The bone lesions are much harder to read. Exam today showed no focal tenderness to palpation and most of these standard uptake values on PET scan were actually decreased. Nevertheless she definitely has multiple bony metastatic spots as well as some cracked ribs on the left and an old compression fracture in the setting of osteoporosis  I think at this point the simplest thing to do is to go from tamoxifen to anastrozole. If we do that we may make her osteoporosis worsened and therefore we discussed various types of osteoporosis prevention and treatment options. After much discussion she would like to start denosumab/Xgeva and I have put her in to start next week.  We did discuss the possible toxicities complications and side effects of this agent in particular concerns regarding osteonecrosis of the jaw. I have also asked her to take extra calcium on the day of denosumab treatment.  She will see [place] in April, the same day as treatment, to make sure she is tolerating these changes well. She would then will see me again in July and before that visit we will consider repeating her scans.  My has a good understanding of this plan. She agrees with it. She knows to call for any problems that may develop before her repeat visit here. Chauncey Cruel, MD   03/22/2016 2:59 PM Medical Oncology and Hematology [facility] [address] [place], [place] [address] Tel. [phone]    Fax. [phone]

## 2016-03-23 ENCOUNTER — Non-Acute Institutional Stay: Payer: Medicare Other | Admitting: Internal Medicine

## 2016-03-23 ENCOUNTER — Encounter: Payer: Self-pay | Admitting: Internal Medicine

## 2016-03-23 VITALS — BP 120/56 | HR 77 | Temp 97.6°F | Ht 61.0 in | Wt 135.0 lb

## 2016-03-23 DIAGNOSIS — R05 Cough: Secondary | ICD-10-CM | POA: Diagnosis not present

## 2016-03-23 DIAGNOSIS — R918 Other nonspecific abnormal finding of lung field: Secondary | ICD-10-CM | POA: Diagnosis not present

## 2016-03-23 DIAGNOSIS — R059 Cough, unspecified: Secondary | ICD-10-CM

## 2016-03-23 DIAGNOSIS — M79601 Pain in right arm: Secondary | ICD-10-CM

## 2016-03-23 DIAGNOSIS — M5412 Radiculopathy, cervical region: Secondary | ICD-10-CM | POA: Diagnosis not present

## 2016-03-23 HISTORY — DX: Radiculopathy, cervical region: M54.12

## 2016-03-23 NOTE — Progress Notes (Signed)
Cassandra Glenn of Service: Clinic (12)     No Known Allergies  Chief Complaint  Patient presents with  . Medical Management of Chronic Issues    one month medication management anxiety, tingling in fingers. No cough. Here with daughter [name].    HPI:  Right arm pain - improved. Mild intermittent tingling. Pain is resolved. PNCV suggests chronic C6 right radiculopathy.  Cough - improved  Mass of right lung - continues with Dr, Jana Hakim. Recent visit with PRT scan. Changed to Arimidex.    Medications: Patient's Medications  New Prescriptions   No medications on file  Previous Medications   ACETAMINOPHEN (TYLENOL) 500 MG TABLET    Take 1,000 mg by mouth every 6 (six) hours as needed for moderate pain.    ALPRAZOLAM (XANAX) 0.5 MG TABLET    One up to 3 times daily for anxiety   ANASTROZOLE (ARIMIDEX) 1 MG TABLET    Take 1 tablet (1 mg total) by mouth daily.   ASPIRIN 81 MG CHEWABLE TABLET    Chew 81 mg by mouth every morning.    BILBERRY 1000 MG CAPS    Take 1,000 mg by mouth every morning.    CALCIUM CARBONATE-VITAMIN D (CALCIUM 600/VITAMIN D PO)    Take 600 mg by mouth 2 (two) times daily.    CALCIUM POLYCARBOPHIL (FIBERCON PO)    Take 1 tablet by mouth 2 (two) times daily.    CHOLECALCIFEROL (VITAMIN D) 400 UNITS TABS    Take 400 Units by mouth every morning.    CRANBERRY EXTRACT PO    Take 1 tablet by mouth every morning.    FISH OIL-OMEGA-3 FATTY ACIDS 1000 MG CAPSULE    Take 1 g by mouth every morning.    GLUCOSAMINE-CHONDROITIN (GLUCOSAMINE CHONDR COMPLEX PO)    Take 1 tablet by mouth 2 (two) times daily. for joints   GUAIFENESIN (MUCINEX) 600 MG 12 HR TABLET    Take 600 mg by mouth 2 (two) times daily.   IBUPROFEN (ADVIL,MOTRIN) 200 MG TABLET    Take 200 mg by mouth. Take 2 tablets as needed for inflammatory pain   MECLIZINE (ANTIVERT) 25 MG TABLET    Take 1 tablet (25 mg total) by mouth 3 (three) times daily as needed for dizziness.   MULTIPLE  VITAMINS-MINERALS (PRESERVISION/LUTEIN) CAPS    Take 1 capsule by mouth 2 (two) times daily.   NYSTATIN (MYCOSTATIN/NYSTOP) POWDER    Apply to affected skin daily to treat yeast   PROBIOTIC PRODUCT (CULTURELLE PRO-WELL PO)    Take by mouth. Take one probiotic tablet in morning   TRAMADOL (ULTRAM) 50 MG TABLET    Take 0.5-1 tablets (25-50 mg total) by mouth every 6 (six) hours as needed.   TRIMETHOPRIM (TRIMPEX) 100 MG TABLET    Take 100 mg by mouth at bedtime.   ZOLPIDEM (AMBIEN) 5 MG TABLET    Take one half tablet at bedtime if needed for rest  Modified Medications   No medications on file  Discontinued Medications   No medications on file     Review of Systems  Constitutional: Positive for activity change, appetite change and fatigue. Negative for fever and unexpected weight change.  HENT: Positive for hearing loss (bilateral earing aides). Negative for ear pain.   Eyes:       History of loss of visual acuity and macular degeneration.  Respiratory: Positive for cough. Negative for shortness of breath.  History of breast discomfort. History of removal of a lump of cancer from the right breast. Dyspnea on exertion.  Cardiovascular: Positive for leg swelling. Negative for chest pain and palpitations.  Gastrointestinal:       She frequently complains that her intestines are not working right. There is a previous history of diverticulosis. Her last colonoscopy was in 2008. She has been seen by Dr. Paulita Fujita in the past. Constipation - she associates with Tramadol  Genitourinary: Positive for decreased urine volume, difficulty urinating and frequency. Negative for dysuria, vaginal bleeding and vaginal discharge.       Complains of a slow, weak urinary stream. Denies dysuria. Has increased frequency and nocturia. Saw Dr. Louis Meckel, urologist. She was taught self-catheterization for a bladder that does not empty well. Vaginal discomfort. Managed by Dr. Claudean Kinds. History of elevated CEA 125.  Normal ultrasound of the abdomen and genitourinary tract. Recurrent UTI.  Musculoskeletal: Positive for back pain.       Chronic back pain.  Patient has a shorter right leg and has a lift in the right shoe. Now seeing Dr. Rhona Raider.  Right hip arthroplasty 5/16 There is generalized stiffness in the joints. There is a history of spinal stenosis. Left third finger catches on extension sometimes. There is a trigger finger. Pain at the left iliac crest and left anterior ribs near the sternum.  Skin:       History of skin discoloration and changes of nails.  Rash on the left scapula, right forearm, and left forearm. [place] in late Oct 2017 and contused the left side of the back.  Allergic/Immunologic: Negative.   Neurological: Positive for dizziness. Negative for tremors, seizures and numbness.       History of spinal stenosis with neurogenic claudication affecting the right leg. Complains of pain in the buttocks that is suggestive of neuralgia from her spinal stenosis. Hospitalized for 24 hours 08/24/2015 for vertigo.  Hematological: Bruises/bleeds easily.  Psychiatric/Behavioral: Positive for dysphoric mood.    Vitals:   03/23/16 0901  BP: (!) 120/56  Pulse: 77  Temp: 97.6 F (36.4 C)  TempSrc: Oral  SpO2: 94%  Weight: 135 lb (61.2 kg)  Height: 5' 1"  (1.549 m)   Wt Readings from Last 3 Encounters:  03/23/16 135 lb (61.2 kg)  03/22/16 136 lb 1.6 oz (61.7 kg)  03/11/16 133 lb 1.6 oz (60.4 kg)    Body mass index is 25.51 kg/m.  Physical Exam  Constitutional: She is oriented to person, place, and time. She appears well-developed and well-nourished. No distress.  HENT:  HOH Nonocclusive wax in both EAC.  Eyes:  Corrective lenses.  Neck: No JVD present. No tracheal deviation present. No thyromegaly present.  Cardiovascular: Normal rate, regular rhythm and normal heart sounds.  Exam reveals no gallop and no friction rub.   No murmur heard. Pulses in RLE not palpable, has 2+  edema which could be obscuring pulses  Pulmonary/Chest: Effort normal and breath sounds normal. She has no rales.  Abdominal: Soft. Bowel sounds are normal. She exhibits no distension and no mass. There is no tenderness.  Musculoskeletal: Normal range of motion. She exhibits edema (RLE) and tenderness (T12-L1 area).  Poor balance; using walker with PT following right hip replacement  Lymphadenopathy:    She has no cervical adenopathy.  Neurological: She is alert and oriented to person, place, and time. No cranial nerve deficit.  Skin: Skin is warm and dry. There is pallor.  Ovoid reddish patch at the left scapula. I am  unable to find any blisters on this patch. Right forearm has a larger firm lump which has a reddish covering. Nontender. Left forearm has a reddish patch, but no lump of tissue under the patch. Left side of the back has a resolving hand sized bruise sustained in a contusion after a fall.  Psychiatric: She has a normal mood and affect. Her behavior is normal. Thought content normal.     Labs reviewed: Lab Summary Latest Ref Rng & Units 03/22/2016 03/11/2016 02/18/2016  Hemoglobin 11.6 - 15.9 g/dL 10.2(L) 10.9(L) 11.2(L)  Hematocrit 34.8 - 46.6 % 30.9(L) 32.5(L) 33.9(L)  White count 3.9 - 10.3 10e3/uL 6.7 6.2 7.3  Platelet count 145 - 400 10e3/uL 204 210 156  Sodium 136 - 145 mEq/L 133(L) 126(L) 129(L)  Potassium 3.5 - 5.1 mEq/L 4.7 4.8 4.2  Calcium 8.4 - 10.4 mg/dL 8.3(L) 8.7 8.9  Phosphorus - (None) (None) (None)  Creatinine 0.6 - 1.1 mg/dL 0.8 0.7 0.71  AST 5 - 34 U/L 49(H) 32 (None)  Alk Phos 40 - 150 U/L 358(H) 420(H) (None)  Bilirubin 0.20 - 1.20 mg/dL 0.29 0.34 (None)  Glucose 70 - 140 mg/dl 105 119 108(H)  Cholesterol - (None) (None) (None)  HDL cholesterol - (None) (None) (None)  Triglycerides - (None) (None) (None)  LDL Direct - (None) (None) (None)  LDL Calc - (None) (None) (None)  Total protein 6.4 - 8.3 g/dL 5.7(L) 6.1(L) (None)  Albumin 3.5 - 5.0 g/dL 3.1(L)  3.4(L) (None)  Some recent data might be hidden   Lab Results  Component Value Date   TSH 3.215 08/25/2015   Lab Results  Component Value Date   BUN 11.3 03/22/2016   BUN 10.1 03/11/2016   BUN 13 02/18/2016   Lab Results  Component Value Date   CREATININE 0.8 03/22/2016   CREATININE 0.7 03/11/2016   CREATININE 0.71 02/18/2016   Lab Results  Component Value Date   HGBA1C 5.9 01/23/2015       Assessment/Plan   1. Right arm pain Secondary to C6 right radiculopathy. Currently in remission  2. Cough resolved  3. Mass of right lung Under treatment with Arimidex.

## 2016-03-24 ENCOUNTER — Telehealth: Payer: Self-pay | Admitting: Oncology

## 2016-03-24 NOTE — Telephone Encounter (Signed)
Patient dtr called re appointments. Schedule appointments per 3/12 los and confirmed monthly appointments from 3/19 to 8/6 with dtr.

## 2016-03-26 ENCOUNTER — Other Ambulatory Visit: Payer: Self-pay | Admitting: *Deleted

## 2016-03-29 ENCOUNTER — Ambulatory Visit (HOSPITAL_BASED_OUTPATIENT_CLINIC_OR_DEPARTMENT_OTHER): Payer: Medicare Other

## 2016-03-29 VITALS — BP 125/46 | HR 84 | Temp 97.6°F | Resp 16

## 2016-03-29 DIAGNOSIS — M818 Other osteoporosis without current pathological fracture: Secondary | ICD-10-CM

## 2016-03-29 DIAGNOSIS — M810 Age-related osteoporosis without current pathological fracture: Secondary | ICD-10-CM | POA: Diagnosis present

## 2016-03-29 DIAGNOSIS — S2232XG Fracture of one rib, left side, subsequent encounter for fracture with delayed healing: Secondary | ICD-10-CM

## 2016-03-29 DIAGNOSIS — C7951 Secondary malignant neoplasm of bone: Secondary | ICD-10-CM

## 2016-03-29 DIAGNOSIS — G893 Neoplasm related pain (acute) (chronic): Principal | ICD-10-CM

## 2016-03-29 MED ORDER — DENOSUMAB 120 MG/1.7ML ~~LOC~~ SOLN
120.0000 mg | Freq: Once | SUBCUTANEOUS | Status: AC
  Administered 2016-03-29: 120 mg via SUBCUTANEOUS
  Filled 2016-03-29: qty 1.7

## 2016-03-29 NOTE — Progress Notes (Signed)
Pt here for first time Xgeva injection.  Per pt, she has no dental issues at present.  Verbal and written explanations given to pt and daughter of SEs of Xgeva.  Both voiced understanding.

## 2016-03-29 NOTE — Patient Instructions (Signed)

## 2016-04-01 ENCOUNTER — Non-Acute Institutional Stay: Payer: Medicare Other | Admitting: Internal Medicine

## 2016-04-01 ENCOUNTER — Encounter: Payer: Self-pay | Admitting: Internal Medicine

## 2016-04-01 DIAGNOSIS — C7972 Secondary malignant neoplasm of left adrenal gland: Secondary | ICD-10-CM

## 2016-04-01 DIAGNOSIS — R609 Edema, unspecified: Secondary | ICD-10-CM | POA: Diagnosis not present

## 2016-04-01 DIAGNOSIS — R739 Hyperglycemia, unspecified: Secondary | ICD-10-CM

## 2016-04-01 DIAGNOSIS — E871 Hypo-osmolality and hyponatremia: Secondary | ICD-10-CM

## 2016-04-01 DIAGNOSIS — I10 Essential (primary) hypertension: Secondary | ICD-10-CM | POA: Diagnosis not present

## 2016-04-01 DIAGNOSIS — R5381 Other malaise: Secondary | ICD-10-CM

## 2016-04-01 DIAGNOSIS — R918 Other nonspecific abnormal finding of lung field: Secondary | ICD-10-CM | POA: Diagnosis not present

## 2016-04-01 DIAGNOSIS — K589 Irritable bowel syndrome without diarrhea: Secondary | ICD-10-CM

## 2016-04-01 DIAGNOSIS — F3341 Major depressive disorder, recurrent, in partial remission: Secondary | ICD-10-CM | POA: Diagnosis not present

## 2016-04-01 DIAGNOSIS — K5900 Constipation, unspecified: Secondary | ICD-10-CM | POA: Diagnosis not present

## 2016-04-01 NOTE — Progress Notes (Signed)
History and Physical    Location:  Uncertain Room Number: [place] of Service:  ALF (13)  PCP: Jeanmarie Hubert, MD Patient Care Team: Estill Dooms, MD as PCP - General (Internal Medicine) Dickson Man Otho Darner, NP as Nurse Practitioner (Nurse Practitioner) Shon Hough, MD as Consulting Physician (Ophthalmology) Molli Posey, MD as Consulting Physician (Obstetrics and Gynecology) Gerarda Fraction, MD as Consulting Physician (Ophthalmology) Lorelle Gibbs, MD as Consulting Physician (Radiology) Melrose Nakayama, MD as Consulting Physician (Orthopedic Surgery) Gerarda Fraction, MD as Referring Physician (Ophthalmology) Ardis Hughs, MD as Attending Physician (Urology) Chauncey Cruel, MD as Consulting Physician (Oncology)  Extended Emergency Contact Information Primary Emergency Contact: Black,Emily Address: [address]          [place], [place] [address] [name] of [place] Phone: [phone] Mobile Phone: [phone] Relation: Daughter Secondary Emergency Contact: Marcine Matar [place] of [place] Mobile Phone: [phone] Relation: Daughter  Code Status: DNR Goals of Care: Advanced Directive information Advanced Directives 04/01/2016  Does Patient Have a Medical Advance Directive? Yes  Type of Advance Directive Living will;Out of facility DNR (pink MOST or yellow form)  Does patient want to make changes to medical advance directive? -  Copy of [place] in Chart? -  Would patient like information on creating a medical advance directive? -  Pre-existing out of facility DNR order (yellow form or pink MOST form) Yellow form placed in chart (order not valid for inpatient use)      Chief Complaint  Patient presents with  . New Admit to AL    03/[facility] from [facility]    HPI: Patient is a 83 y.o. female seen today for admission to [facility] on 03/24/16. She has a number of medical  issues that are creating a weak state.  She has lung cancer and a right pleural effusion. There is some DOE. She is followed by Dr. Jana Hakim, oncology.  There is edema of both legs.  She has a problem with recurrent UTI.  Depression is mild and seems under control to me, but her daughter worries about her state of mind.  Past Medical History:  Diagnosis Date  . Abnormal PET scan of mediastinum 11/11/2015  . Abnormalities of the hair 02/29/2012  . Arthritis   . BPV (benign positional vertigo)   . Cancer ([place])   . Cancer antigen 125 (CA 125) elevation 11/27/2013  . Candidiasis of skin and nails 03/19/2011  . 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 Chorioretinal scar, macular 01/23/2015  . Closed fracture of base of neck of femur ([facility]) 03/18/2011  . Compression fracture of thoracic vertebra (HCC) 11/24/2012  . Contact dermatitis and other eczema, due to unspecified cause 05/07/2011  . Depression 07/23/2014  . Diverticulosis of colon (without mention of hemorrhage) 03/19/2011  . Edema 05/07/2011  . HOH (hard of hearing)   . Hypertension 02/24/2016  . Hypopotassemia 03/26/2011  . Hyposmolality and/or hyponatremia 03/19/2011  . Hypotension, unspecified 03/18/2011  . IBS (irritable bowel syndrome) 11/27/2013  . Idiopathic scoliosis 01/27/2016  . Impaired fasting glucose 03/19/2011  . Insomnia, unspecified 03/19/2011  . Kidney infection   . Lumbago 03/19/2011  . Macular degeneration   . Malignant neoplasm of breast (female), unspecified site 10/28/2002  . Mass of right lung 10/23/2015  . Metastasis to adrenal gland ([place]) 11/21/2015   Breast cancer  . Metastatic breast cancer ([place]) 10/27/2004  . Nonexudative age-related macular degeneration 12/10/2010  . Osteoporosis 08/06/2014  .  Osteoporosis, unspecified 03/19/2011  . Other malaise and fatigue 03/19/2011  . Pain in joint, pelvic region and thigh 03/19/2011  . Primary osteoarthritis of right hip 05/28/2014   TOTAL HIP ARTHROPLASTY  ANTERIOR APPROACH HARDWARE REMOVAL on 05/28/2014   . Purpura senilis ([place]) 07/31/2013  . Radiculopathy, cervical 03/23/2016   Right C6  . Rib fractures   . Right bundle branch block 08/30/2011  . Shortness of breath   . Spasm of muscle 03/19/2011  . Spinal stenosis, lumbar region, with neurogenic claudication 11/02/2011  . TMJ click 9/48/5462  . Trigger finger (acquired) 11/02/2011  . Unspecified constipation 03/19/2011  . Urinary frequency 04/24/2013  . UTI (urinary tract infection) 04/16/2013   04/16/13 pseudomonas aeruginosa: tx with Cipro 06/15/14 P. Aeruginosa Fortaz 576m IM q12h x 7 days.     . Vaginitis and vulvovaginitis 02/29/2012  . Vertigo 08/26/2015   Past Surgical History:  Procedure Laterality Date  . BREAST SURGERY Bilateral 2005-10/27/2004   lumpectomy- Streck,MD  . CATARACT EXTRACTION  2010   bialteral  . EYE SURGERY     cataract  . FRACTURE SURGERY Left 09/1980   ankle  . HARDWARE REMOVAL Right 05/28/2014   Procedure: HARDWARE REMOVAL;  Surgeon: PMelrose Nakayama MD;  [facility]  Service: Orthopedics;  Laterality: Right;  . HIP PINNING,CANNULATED  03/08/2011   Procedure: CANNULATED HIP PINNING;  Surgeon: JJohnn Hai MD;  Location: [facility];  Service: Orthopedics;  Laterality: Right;  . ORIF HIP FRACTURE Right 03/08/2011   Bean,MD  . SKIN CANCER EXCISION Left 10/12/2012   lower leg Dr. [place] . SKIN LESION EXCISION Right 02/04/2011   Abdomen lesion spongiotic dermatitis-Taffeen, MD  . SQUAMOUS CELL CARCINOMA EXCISION Right 02/04/2011   forearm-[place] MD  . TONSILLECTOMY    . TOTAL HIP ARTHROPLASTY Right 05/28/2014   Procedure: TOTAL HIP ARTHROPLASTY ANTERIOR APPROACH;  Surgeon: PMelrose Nakayama MD;  Location: [facility]  Service: Orthopedics;  Laterality: Right;    reports that she has never smoked. She has never used smokeless tobacco. She reports that she drinks alcohol. She reports that she does not use drugs. Social History   Social History  . Marital  status: Widowed    Spouse name: N/A  . Number of children: N/A  . Years of education: N/A   Occupational History  . homemaker    Social History Main Topics  . Smoking status: Never Smoker  . Smokeless tobacco: Never Used  . Alcohol use 0.0 oz/week     Comment: occasional glass  . Drug use: No  . Sexual activity: No   Other Topics Concern  . Not on file   Social History Narrative   [facility]  [facility]   Never smoked   Alcohol occasionally   Walks with walker   Exercise none   POA, Living Will, DNR          Family History  Problem Relation Age of Onset  . Heart disease Mother   . Cancer Father   . Emphysema Brother   . Alzheimer's disease Brother     Health Maintenance  Topic Date Due  . TETANUS/TDAP  10/19/1939  . PNA vac Low Risk Adult (2 of 2 - PPSV23) 01/12/2004  . MAMMOGRAM  08/11/2016  . INFLUENZA VACCINE  Completed  . DEXA SCAN  Completed    No Known Allergies  Allergies as of 04/01/2016   No Known Allergies     Medication List  Accurate as of 04/01/16  4:57 PM. Always use your most recent med list.          acetaminophen 500 MG tablet Commonly known as:  TYLENOL Take 1,000 mg by mouth every 6 (six) hours as needed for moderate pain.   ALPRAZolam 0.5 MG tablet Commonly known as:  XANAX One up to 3 times daily for anxiety   anastrozole 1 MG tablet Commonly known as:  ARIMIDEX Take 1 tablet (1 mg total) by mouth daily.   aspirin 81 MG chewable tablet Chew 81 mg by mouth every morning.   Bilberry 1000 MG Caps Take 1,000 mg by mouth every morning.   CALCIUM 600/VITAMIN D PO Take 600 mg by mouth 2 (two) times daily.   cholecalciferol 400 units Tabs tablet Commonly known as:  VITAMIN D Take 400 Units by mouth every morning.   CRANBERRY EXTRACT PO Take 1 tablet by mouth every morning.   CULTURELLE PRO-WELL PO Take by mouth. Take one probiotic tablet in morning   FIBERCON PO Take 1 tablet by mouth 2 (two)  times daily.   fish oil-omega-3 fatty acids 1000 MG capsule Take 1 g by mouth every morning.   GLUCOSAMINE CHONDR COMPLEX PO Take 1 tablet by mouth 2 (two) times daily. for joints   ibuprofen 200 MG tablet Commonly known as:  ADVIL,MOTRIN Take 200 mg by mouth. Take 2 tablets as needed for inflammatory pain   meclizine 25 MG tablet Commonly known as:  ANTIVERT Take 1 tablet (25 mg total) by mouth 3 (three) times daily as needed for dizziness.   MUCINEX 600 MG 12 hr tablet Generic drug:  guaiFENesin Take 600 mg by mouth 2 (two) times daily.   nystatin powder Commonly known as:  MYCOSTATIN/NYSTOP Apply to affected skin daily to treat yeast   polyethylene glycol packet Commonly known as:  MIRALAX / GLYCOLAX Take 17 g by mouth daily.   PRESERVISION/LUTEIN Caps Take 1 capsule by mouth 2 (two) times daily.   traMADol 50 MG tablet Commonly known as:  ULTRAM Take 0.5-1 tablets (25-50 mg total) by mouth every 6 (six) hours as needed.   trimethoprim 100 MG tablet Commonly known as:  TRIMPEX Take 100 mg by mouth at bedtime.   zolpidem 5 MG tablet Commonly known as:  AMBIEN Take one half tablet at bedtime if needed for rest       Review of Systems  Constitutional: Positive for activity change, appetite change and fatigue. Negative for fever and unexpected weight change.  HENT: Positive for hearing loss (bilateral earing aides). Negative for ear pain.   Eyes:       History of loss of visual acuity and macular degeneration.  Respiratory: Positive for cough. Negative for shortness of breath.        History of breast discomfort. History of removal of a lump of cancer from the right breast. Dyspnea on exertion.  Cardiovascular: Positive for leg swelling. Negative for chest pain and palpitations.  Gastrointestinal:       She frequently complains that her intestines are not working right. There is a previous history of diverticulosis. Her last colonoscopy was in 2008. She has been  seen by Dr. Paulita Fujita in the past. Constipation - she associates with Tramadol  Genitourinary: Positive for decreased urine volume, difficulty urinating and frequency. Negative for dysuria, vaginal bleeding and vaginal discharge.       Complains of a slow, weak urinary stream. Denies dysuria. Has increased frequency and nocturia. Saw Dr. Louis Meckel, urologist.  She was taught self-catheterization for a bladder that does not empty well. Vaginal discomfort. Managed by Dr. Claudean Kinds. History of elevated CEA 125. Normal ultrasound of the abdomen and genitourinary tract. Recurrent UTI.  Musculoskeletal: Positive for back pain.       Chronic back pain.  Patient has a shorter right leg and has a lift in the right shoe. Now seeing Dr. Rhona Raider.  Right hip arthroplasty 5/16 There is generalized stiffness in the joints. There is a history of spinal stenosis. Left third finger catches on extension sometimes. There is a trigger finger. Pain at the left iliac crest and left anterior ribs near the sternum.  Skin:       History of skin discoloration and changes of nails.  Rash on the left scapula, right forearm, and left forearm. [place] in late Oct 2017 and contused the left side of the back.  Allergic/Immunologic: Negative.   Neurological: Positive for dizziness. Negative for tremors, seizures and numbness.       History of spinal stenosis with neurogenic claudication affecting the right leg. Complains of pain in the buttocks that is suggestive of neuralgia from her spinal stenosis. Hospitalized for 24 hours 08/24/2015 for vertigo.  Hematological: Bruises/bleeds easily.  Psychiatric/Behavioral: Positive for dysphoric mood.    Vitals:   04/01/16 1649  BP: (!) 118/50  Pulse: 80  Resp: 20  Temp: (!) 96.7 F (35.9 C)  SpO2: 96%  Weight: 137 lb (62.1 kg)  Height: _0  (1.549 m)   Body mass index is 25.89 kg/m. Physical Exam  Constitutional: She is oriented to person, place, and time. She appears  well-developed and well-nourished. No distress.  HENT:  HOH Nonocclusive wax in both EAC.  Eyes:  Corrective lenses.  Neck: No JVD present. No tracheal deviation present. No thyromegaly present.  Cardiovascular: Normal rate, regular rhythm and normal heart sounds.  Exam reveals no gallop and no friction rub.   No murmur heard. Pulses in RLE not palpable, has 2+ edema which could be obscuring pulses  Pulmonary/Chest: Effort normal and breath sounds normal. She has no rales.  Abdominal: Soft. Bowel sounds are normal. She exhibits no distension and no mass. There is no tenderness.  Musculoskeletal: Normal range of motion. She exhibits edema (RLE) and tenderness (T12-L1 area).  Poor balance; using walker with PT following right hip replacement  Lymphadenopathy:    She has no cervical adenopathy.  Neurological: She is alert and oriented to person, place, and time. No cranial nerve deficit.  Skin: Skin is warm and dry. There is pallor.  Ovoid reddish patch at the left scapula. I am unable to find any blisters on this patch. Right forearm has a larger firm lump which has a reddish covering. Nontender. Left forearm has a reddish patch, but no lump of tissue under the patch. Left side of the back has a resolving hand sized bruise sustained in a contusion after a fall.  Psychiatric: She has a normal mood and affect. Her behavior is normal. Thought content normal.    Labs reviewed: Basic Metabolic Panel:  Recent Labs  12/03/15 0716 01/19/16 0800  02/18/16 1204 03/11/16 1307 03/22/16 1445  NA 133* 134*  < > 129* 126* 133*  K 4.2 4.5  < > 4.2 4.8 4.7  CL 101 98  --  98*  --   --   CO2 24 28  < > _1 GLUCOSE 118* 107*  < > 108* 119 105  BUN 19 15  < >  13 10.1 11.3  CREATININE 0.80 0.81  < > 0.71 0.7 0.8  CALCIUM 9.3 9.5  < > 8.9 8.7 8.3*  < > = values in this interval not displayed. Liver Function Tests:  Recent Labs  02/11/16 1148 03/11/16 1307 03/22/16 1445  AST 40* 32 49*    ALT 27 22 45  ALKPHOS 235* 420* 358*  BILITOT 0.50 0.34 0.29  PROT 6.5 6.1* 5.7*  ALBUMIN 3.7 3.4* 3.1*   No results for input(s): LIPASE, AMYLASE in the last 8760 hours. No results for input(s): AMMONIA in the last 8760 hours. CBC:  Recent Labs  02/11/16 1148 02/18/16 1204 03/11/16 1307 03/22/16 1445  WBC 8.7 7.3 6.2 6.7  NEUTROABS 5.0  --  3.7 3.5  HGB 10.9* 11.2* 10.9* 10.2*  HCT 32.7* 33.9* 32.5* 30.9*  MCV 93.7 93.1 93.7 93.6  PLT 161 156 210 204   Cardiac Enzymes: No results for input(s): CKTOTAL, CKMB, CKMBINDEX, TROPONINI in the last 8760 hours. BNP: Invalid input(s): POCBNP Lab Results  Component Value Date   HGBA1C 5.9 01/23/2015   Lab Results  Component Value Date   TSH 3.215 08/25/2015   No results found for: VITAMINB12 No results found for: FOLATE Lab Results  Component Value Date   FERRITIN 74 08/30/2005    Imaging and Procedures obtained prior to SNF admission: Nm Pet Image Restag (ps) Skull Base To Thigh  Result Date: 03/16/2016 CLINICAL DATA:  Subsequent treatment strategy for metastatic breast cancer. EXAM: NUCLEAR MEDICINE PET SKULL BASE TO THIGH TECHNIQUE: 6.6 mCi F-18 FDG was injected intravenously. Full-ring PET imaging was performed from the skull base to thigh after the radiotracer. CT data was obtained and used for attenuation correction and anatomic localization. FASTING BLOOD GLUCOSE:  Value: 124 mg/dl COMPARISON:  CT chest/abdomen/pelvis dated 02/18/2016 FINDINGS: NECK No hypermetabolic lymph nodes in the neck. CHEST 13 mm nodule along the anterior right lung base (series 8/image 51), max SUV 3.7, metastasis not excluded. Moderate right pleural effusion with associated right lower lobe atelectasis. Subpleural scarring with volume loss in the anterior right middle lobe (series 8/image 30) and medial right lower lobe (series 8/ image 47), non FDG avid. Biapical pleural-parenchymal scarring/radiation changes (series 8/image 11). Small left  axillary nodes measuring up to 6 mm short axis. Hypermetabolic mediastinal lymphadenopathy, including: --9 mm short axis low right paratracheal node (series 4/ image 61), max SUV 4.2 (previously 8.7) --8 mm short axis subcarinal node (series 4/image 65), max SUV 4.4 (previously 9.9) --6 mm short axis right pericardial node (series 4/ image 70), max SUV 4.9 (previously 4.9) ABDOMEN/PELVIS 1.8 x 1.1 cm left adrenal nodule (series 4/image 99), max SUV 6.0 (previously 11.8), corresponding to known adrenal metastasis. Right adrenal gland demonstrates max SUV 4.3 (previously 8.4). No abnormal hypermetabolic activity within the liver, pancreas, or spleen. No hypermetabolic lymph nodes in the abdomen or pelvis. 5 mm short axis right pelvic sidewall node (series 4/image 146), non FDG avid (previously max SUV 5.6). Atherosclerotic calcifications the abdominal aorta and branch vessels. Trace right pelvic ascites. SKELETON Degenerative changes of the visualized thoracolumbar spine. Right hip arthroplasty. Multifocal/ diffuse lytic/sclerotic metastases are suspected on CT. Additionally, there are suspected multiple rib fractures, some of which are acute/ subacute (series 4/images 40, 49, 54, 60, and 78) and some of which are subacute/chronic (series 4/ images 86 and 92). Multifocal hypermetabolism is present involving the bilateral ribs, max SUV 4.9 on the left and 3.5 on the right, although it is unclear to what extent  this reflects trauma versus neoplasm. IMPRESSION: 13 mm pleural-based nodule the anterior right lung base, suspicious for pulmonary metastasis. Mild mediastinal nodal metastases and bilateral adrenal metastases. Multifocal osseous metastases, with superimposed acute/subacute pathologic fractures involving multiple left ribs. When compared to the prior, the suspected pulmonary metastasis was not clearly evident on PET, although this may be technical. Otherwise, the hypermetabolism of most lesions has actually  decreased. Electronically Signed   By: Julian Hy M.D.   On: 03/16/2016 16:27    Assessment/Plan  1. Mass of right lung Cancer met, likely of the breast.   2. Malignant neoplasm metastatic to left adrenal gland (HCC) Biopsied. May have something to do with hyponatremia.  3. Hyponatremia follow lab closely  4. Hyperglycemia Follow lab  5. Irritable bowel syndrome, unspecified type Constipated. Will start Miralax daily at 8 gm in 6-8 oz fluid  6. Malaise She just does not feel well. Nonspecific symptom.  7. Constipation, unspecified constipation type Start daily Miralax  8. Hypertension, unspecified type controlled  9. Recurrent major depressive disorder, in partial remission ([place]) The current medical regimen is effective;  continue present plan and medications.  10. Edema, unspecified type Getting worse. -Furosemide 40 mg daily

## 2016-04-02 ENCOUNTER — Non-Acute Institutional Stay: Payer: Medicare Other | Admitting: Nurse Practitioner

## 2016-04-02 ENCOUNTER — Telehealth: Payer: Self-pay | Admitting: *Deleted

## 2016-04-02 ENCOUNTER — Encounter: Payer: Self-pay | Admitting: Nurse Practitioner

## 2016-04-02 DIAGNOSIS — S22000S Wedge compression fracture of unspecified thoracic vertebra, sequela: Secondary | ICD-10-CM

## 2016-04-02 DIAGNOSIS — D638 Anemia in other chronic diseases classified elsewhere: Secondary | ICD-10-CM

## 2016-04-02 DIAGNOSIS — R079 Chest pain, unspecified: Secondary | ICD-10-CM | POA: Diagnosis not present

## 2016-04-02 DIAGNOSIS — F419 Anxiety disorder, unspecified: Secondary | ICD-10-CM

## 2016-04-02 DIAGNOSIS — G4700 Insomnia, unspecified: Secondary | ICD-10-CM

## 2016-04-02 DIAGNOSIS — F3341 Major depressive disorder, recurrent, in partial remission: Secondary | ICD-10-CM

## 2016-04-02 DIAGNOSIS — K5900 Constipation, unspecified: Secondary | ICD-10-CM | POA: Diagnosis not present

## 2016-04-02 DIAGNOSIS — S2242XG Multiple fractures of ribs, left side, subsequent encounter for fracture with delayed healing: Secondary | ICD-10-CM

## 2016-04-02 DIAGNOSIS — E871 Hypo-osmolality and hyponatremia: Secondary | ICD-10-CM | POA: Diagnosis not present

## 2016-04-02 DIAGNOSIS — M546 Pain in thoracic spine: Secondary | ICD-10-CM | POA: Diagnosis not present

## 2016-04-02 DIAGNOSIS — R609 Edema, unspecified: Secondary | ICD-10-CM

## 2016-04-02 DIAGNOSIS — M545 Low back pain: Secondary | ICD-10-CM | POA: Diagnosis not present

## 2016-04-02 NOTE — Assessment & Plan Note (Signed)
Mood is managed with Alprazolam tid.

## 2016-04-02 NOTE — Telephone Encounter (Signed)
Family member called wanting to inform MD Magrinat that the x-ray showed broken ribs bones 1-10. Look at recent note for more explanation.

## 2016-04-02 NOTE — Assessment & Plan Note (Signed)
Her mood is managed with Alprazolam tid.

## 2016-04-02 NOTE — Assessment & Plan Note (Signed)
Trace edema BLE  

## 2016-04-02 NOTE — Assessment & Plan Note (Signed)
03/22/16 Hgb 10.2

## 2016-04-02 NOTE — Assessment & Plan Note (Addendum)
pain, bruise left side, X-ray 04/02/16 L ribs, L spine, T spine showed acute mildly displaced left first to tenth rib fractures. T11 vertebral body 80% reduction in height. She stated her pain is minimal, able to take deep breath and move around her trunk w/o excessive pain, taking Tramadol tid presently, which is adequate. The denied trauma to her ribs, given her metastatic disease, it could be pathologic etiology of her fxs Update CBC and CMP

## 2016-04-02 NOTE — Assessment & Plan Note (Signed)
Stable, continue MiraLax daily.  

## 2016-04-02 NOTE — Assessment & Plan Note (Signed)
03/22/16 Na 133

## 2016-04-02 NOTE — Progress Notes (Signed)
Location:   [facility]ursing [facility] Room Number: ER1   Place of Service: [place] [facility]  Provider:  [facility] Arlina Sabina NP  Jeanmarie Hubert, MD  Patient Care Team: Estill Dooms, MD as PCP - General (Internal Medicine) [place] Jaycion Treml Otho Darner, NP as Nurse Practitioner (Nurse Practitioner) Shon Hough, MD as Consulting Physician (Ophthalmology) Molli Posey, MD as Consulting Physician (Obstetrics and Gynecology) Gerarda Fraction, MD as Consulting Physician (Ophthalmology) Lorelle Gibbs, MD as Consulting Physician (Radiology) Melrose Nakayama, MD as Consulting Physician (Orthopedic Surgery) Gerarda Fraction, MD as Referring Physician (Ophthalmology) Ardis Hughs, MD as Attending Physician (Urology) Chauncey Cruel, MD as Consulting Physician (Oncology)  Extended Emergency Contact Information Primary Emergency Contact: Black,Emily Address: [address]          [place], [place] [address] [name] of [place] Phone: [phone] Mobile Phone: [phone] Relation: Daughter Secondary Emergency Contact: Marcine Matar [place] of [place] Mobile Phone: [phone] Relation: Daughter  Code Status:  DNR Goals of care: Advanced Directive information Advanced Directives 04/01/2016  Does Patient Have a Medical Advance Directive? Yes  Type of Advance Directive Living will;Out of facility DNR (pink MOST or yellow form)  Does patient want to make changes to medical advance directive? -  Copy of [place] in Chart? -  Would patient like information on creating a medical advance directive? -  Pre-existing out of facility DNR order (yellow form or pink MOST form) Yellow form placed in chart (order not valid for inpatient use)     Chief Complaint  Patient presents with  . Acute Visit    left rib 1-10th fxs    HPI:  Pt is a 83 y.o. female seen today for pain, bruise left side, X-ray 04/02/16 L ribs, L spine, T spine showed acute mildly displaced left  first to tenth rib fractures. T11 vertebral body 80% reduction in height. She stated her pain is minimal, able to take deep breath and move around her trunk w/o excessive pain, taking Tramadol tid presently, which is adequate.    Hx of constipation, taking MiraLax daily, takes TMP nightly for UTI suppression, she takes Xanax 0.5mg  tid for anxiety, prn Ambien for sleep.   Past Medical History:  Diagnosis Date  . Abnormal PET scan of mediastinum 11/11/2015  . Abnormalities of the hair 02/29/2012  . Arthritis   . BPV (benign positional vertigo)   . Cancer ([place])   . Cancer antigen 125 (CA 125) elevation 11/27/2013  . Candidiasis of skin and nails 03/19/2011  . 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 Chorioretinal scar, macular 01/23/2015  . Closed fracture of base of neck of femur ([place]) 03/18/2011  . Compression fracture of thoracic vertebra ([facility]) 11/24/2012  . Contact dermatitis and other eczema, due to unspecified cause 05/07/2011  . Depression 07/23/2014  . Diverticulosis of colon (without mention of hemorrhage) 03/19/2011  . Edema 05/07/2011  . HOH (hard of hearing)   . Hypertension 02/24/2016  . Hypopotassemia 03/26/2011  . Hyposmolality and/or hyponatremia 03/19/2011  . Hypotension, unspecified 03/18/2011  . IBS (irritable bowel syndrome) 11/27/2013  . Idiopathic scoliosis 01/27/2016  . Impaired fasting glucose 03/19/2011  . Insomnia, unspecified 03/19/2011  . Kidney infection   . Lumbago 03/19/2011  . Macular degeneration   . Malignant neoplasm of breast (female), unspecified site 10/28/2002  . Mass of right lung 10/23/2015  . Metastasis to adrenal gland ([place]) 11/21/2015   Breast cancer  . Metastatic breast cancer ([place]) 10/27/2004  . Nonexudative  age-related macular degeneration 12/10/2010  . Osteoporosis 08/06/2014  . Osteoporosis, unspecified 03/19/2011  . Other malaise and fatigue 03/19/2011  . Pain in joint, pelvic region and thigh 03/19/2011  . Primary osteoarthritis of  right hip 05/28/2014   TOTAL HIP ARTHROPLASTY ANTERIOR APPROACH HARDWARE REMOVAL on 05/28/2014   . Purpura senilis ([place]) 07/31/2013  . Radiculopathy, cervical 03/23/2016   Right C6  . Rib fractures   . Right bundle branch block 08/30/2011  . Shortness of breath   . Spasm of muscle 03/19/2011  . Spinal stenosis, lumbar region, with neurogenic claudication 11/02/2011  . TMJ click 4/76/5465  . Trigger finger (acquired) 11/02/2011  . Unspecified constipation 03/19/2011  . Urinary frequency 04/24/2013  . UTI (urinary tract infection) 04/16/2013   04/16/13 pseudomonas aeruginosa: tx with Cipro 06/15/14 P. Aeruginosa [name] 500mg  IM q12h x 7 days.     . Vaginitis and vulvovaginitis 02/29/2012  . Vertigo 08/26/2015   Past Surgical History:  Procedure Laterality Date  . BREAST SURGERY Bilateral 2005-10/27/2004   lumpectomy- Streck,MD  . CATARACT EXTRACTION  2010   bialteral  . EYE SURGERY     cataract  . FRACTURE SURGERY Left 09/1980   ankle  . HARDWARE REMOVAL Right 05/28/2014   Procedure: HARDWARE REMOVAL;  Surgeon: Melrose Nakayama, MD;  Location: [place];  Service: Orthopedics;  Laterality: Right;  . HIP PINNING,CANNULATED  03/08/2011   Procedure: CANNULATED HIP PINNING;  Surgeon: Johnn Hai, MD;  Location: [facility];  Service: Orthopedics;  Laterality: Right;  . ORIF HIP FRACTURE Right 03/08/2011   Bean,MD  . SKIN CANCER EXCISION Left 10/12/2012   lower leg Dr. Syble Creek  . SKIN LESION EXCISION Right 02/04/2011   Abdomen lesion spongiotic dermatitis-Taffeen, MD  . SQUAMOUS CELL CARCINOMA EXCISION Right 02/04/2011   forearmSyble Creek, MD  . TONSILLECTOMY    . TOTAL HIP ARTHROPLASTY Right 05/28/2014   Procedure: TOTAL HIP ARTHROPLASTY ANTERIOR APPROACH;  Surgeon: Melrose Nakayama, MD;  Location: [facility];  Service: Orthopedics;  Laterality: Right;    No Known Allergies  Allergies as of 04/02/2016   No Known Allergies     Medication List       Accurate as of 04/02/16  1:52 PM. Always use your most  recent med list.          acetaminophen 500 MG tablet Commonly known as:  TYLENOL Take 1,000 mg by mouth every 6 (six) hours as needed for moderate pain.   ALPRAZolam 0.5 MG tablet Commonly known as:  XANAX One up to 3 times daily for anxiety   anastrozole 1 MG tablet Commonly known as:  ARIMIDEX Take 1 tablet (1 mg total) by mouth daily.   aspirin 81 MG chewable tablet Chew 81 mg by mouth every morning.   Bilberry 1000 MG Caps Take 1,000 mg by mouth every morning.   CALCIUM 600/VITAMIN D PO Take 600 mg by mouth 2 (two) times daily.   cholecalciferol 400 units Tabs tablet Commonly known as:  VITAMIN D Take 400 Units by mouth every morning.   CRANBERRY EXTRACT PO Take 1 tablet by mouth every morning.   CULTURELLE PRO-WELL PO Take by mouth. Take one probiotic tablet in morning   FIBERCON PO Take 1 tablet by mouth 2 (two) times daily.   fish oil-omega-3 fatty acids 1000 MG capsule Take 1 g by mouth every morning.   GLUCOSAMINE CHONDR COMPLEX PO Take 1 tablet by mouth 2 (two) times daily. for joints   ibuprofen 200 MG tablet Commonly known  as:  ADVIL,MOTRIN Take 200 mg by mouth. Take 2 tablets as needed for inflammatory pain   meclizine 25 MG tablet Commonly known as:  ANTIVERT Take 1 tablet (25 mg total) by mouth 3 (three) times daily as needed for dizziness.   MUCINEX 600 MG 12 hr tablet Generic drug:  guaiFENesin Take 600 mg by mouth 2 (two) times daily.   nystatin powder Commonly known as:  MYCOSTATIN/NYSTOP Apply to affected skin daily to treat yeast   polyethylene glycol packet Commonly known as:  MIRALAX / GLYCOLAX Take 17 g by mouth daily.   PRESERVISION/LUTEIN Caps Take 1 capsule by mouth 2 (two) times daily.   traMADol 50 MG tablet Commonly known as:  ULTRAM Take 0.5-1 tablets (25-50 mg total) by mouth every 6 (six) hours as needed.   trimethoprim 100 MG tablet Commonly known as:  TRIMPEX Take 100 mg by mouth at bedtime.   zolpidem 5  MG tablet Commonly known as:  AMBIEN Take one half tablet at bedtime if needed for rest       Review of Systems  Constitutional: Positive for activity change, appetite change and fatigue. Negative for fever and unexpected weight change.  HENT: Positive for hearing loss (bilateral earing aides). Negative for ear pain.   Eyes:       History of loss of visual acuity and macular degeneration.  Respiratory: Positive for cough. Negative for shortness of breath.        History of breast discomfort. History of removal of a lump of cancer from the right breast. Dyspnea on exertion.  Cardiovascular: Positive for leg swelling. Negative for chest pain and palpitations.  Gastrointestinal:       She frequently complains that her intestines are not working right. There is a previous history of diverticulosis. Her last colonoscopy was in 2008. She has been seen by Dr. Paulita Fujita in the past. Constipation - she associates with Tramadol  Genitourinary: Positive for decreased urine volume, difficulty urinating and frequency. Negative for dysuria, vaginal bleeding and vaginal discharge.       Complains of a slow, weak urinary stream. Denies dysuria. Has increased frequency and nocturia. Saw Dr. Louis Meckel, urologist. She was taught self-catheterization for a bladder that does not empty well. Vaginal discomfort. Managed by Dr. Claudean Kinds. History of elevated CEA 125. Normal ultrasound of the abdomen and genitourinary tract. Recurrent UTI.  Musculoskeletal: Positive for back pain.       Chronic back pain.  Patient has a shorter right leg and has a lift in the right shoe. Now seeing Dr. Rhona Raider.  Right hip arthroplasty 5/16 There is generalized stiffness in the joints. There is a history of spinal stenosis. Left third finger catches on extension sometimes. There is a trigger finger. Pain at the left iliac crest and left anterior ribs near the sternum. Left rib cage mild pain with deep breath and truncal movement.     Skin:       History of skin discoloration and changes of nails.  [place] in late Oct 2017 and contused the left side of the back Left side bruise.  Allergic/Immunologic: Negative.   Neurological: Positive for dizziness. Negative for tremors, seizures and numbness.       History of spinal stenosis with neurogenic claudication affecting the right leg. Complains of pain in the buttocks that is suggestive of neuralgia from her spinal stenosis. Hospitalized for 24 hours 08/24/2015 for vertigo.  Hematological: Bruises/bleeds easily.  Psychiatric/Behavioral: Positive for dysphoric mood.    Immunization History  Administered Date(s) Administered  . Influenza Whole 10/12/2011, 10/12/2012  . Influenza-Unspecified 10/25/2013, 10/10/2014, 12/12/2015  . PPD Test 07/07/2010  . Pneumococcal Conjugate-13 01/12/2003  . Zoster 01/12/2007   Pertinent  Health Maintenance Due  Topic Date Due  . PNA vac Low Risk Adult (2 of 2 - PPSV23) 01/12/2004  . MAMMOGRAM  08/11/2016  . INFLUENZA VACCINE  Completed  . DEXA SCAN  Completed   Fall Risk  11/11/2015 10/16/2015 07/03/2015 01/28/2015 10/22/2014  Falls in the past year? Yes No No No No  Number falls in past yr: 1 - - - -  Injury with Fall? Yes - - - -  Risk Factor Category  High Fall Risk - - - -  Risk for fall due to : History of fall(s);Impaired balance/gait;Impaired mobility - - - -  Follow up Falls evaluation completed - - - -   Functional Status Survey:    Vitals:   04/02/16 1329  BP: (!) 142/62  Pulse: 81  Resp: 18   There is no height or weight on file to calculate BMI. Physical Exam  Constitutional: She is oriented to person, place, and time. She appears well-developed and well-nourished. No distress.  HENT:  HOH Nonocclusive wax in both EAC.  Eyes:  Corrective lenses.  Neck: No JVD present. No tracheal deviation present. No thyromegaly present.  Cardiovascular: Normal rate, regular rhythm and normal heart sounds.  Exam reveals no  gallop and no friction rub.   No murmur heard. Pulses in RLE not palpable, has 2+ edema which could be obscuring pulses  Pulmonary/Chest: Effort normal and breath sounds normal. She has no rales.  Abdominal: Soft. Bowel sounds are normal. She exhibits no distension and no mass. There is no tenderness.  Musculoskeletal: Normal range of motion. She exhibits edema (RLE) and tenderness (T12-L1 area).  Trace edema BLE.   Lymphadenopathy:    She has no cervical adenopathy.  Neurological: She is alert and oriented to person, place, and time. No cranial nerve deficit.  Skin: Skin is warm and dry. There is pallor.  Left side of the rib cage and flank bruised, denied trauma  Psychiatric: She has a normal mood and affect. Her behavior is normal. Thought content normal.    Labs reviewed:  Recent Labs  12/03/15 0716 01/19/16 0800  02/18/16 1204 03/11/16 1307 03/22/16 1445  NA 133* 134*  < > 129* 126* 133*  K 4.2 4.5  < > 4.2 4.8 4.7  CL 101 98  --  98*  --   --   CO2 24 28  < > 23 24 26   GLUCOSE 118* 107*  < > 108* 119 105  BUN 19 15  < > 13 10.1 11.3  CREATININE 0.80 0.81  < > 0.71 0.7 0.8  CALCIUM 9.3 9.5  < > 8.9 8.7 8.3*  < > = values in this interval not displayed.  Recent Labs  02/11/16 1148 03/11/16 1307 03/22/16 1445  AST 40* 32 49*  ALT 27 22 45  ALKPHOS 235* 420* 358*  BILITOT 0.50 0.34 0.29  PROT 6.5 6.1* 5.7*  ALBUMIN 3.7 3.4* 3.1*    Recent Labs  02/11/16 1148 02/18/16 1204 03/11/16 1307 03/22/16 1445  WBC 8.7 7.3 6.2 6.7  NEUTROABS 5.0  --  3.7 3.5  HGB 10.9* 11.2* 10.9* 10.2*  HCT 32.7* 33.9* 32.5* 30.9*  MCV 93.7 93.1 93.7 93.6  PLT 161 156 210 204   Lab Results  Component Value Date   TSH 3.215 08/25/2015  Lab Results  Component Value Date   HGBA1C 5.9 01/23/2015   No results found for: CHOL, HDL, LDLCALC, LDLDIRECT, TRIG, CHOLHDL  Significant Diagnostic Results in last 30 days:  Dg Chest 2 View  Result Date: 03/11/2016 CLINICAL DATA:   Chest pain.  History of metastatic breast carcinoma EXAM: CHEST  2 VIEW COMPARISON:  Chest radiograph February 11, 2016 and chest CT February 18, 2016 FINDINGS: There is a persistent right pleural effusion with patchy consolidation in the right base. There is scarring with pleural thickening in the left apex. There is scarring in the right upper lobe medially as well. Lungs elsewhere appear clear. Heart size and pulmonary vascularity are normal. No adenopathy. There is atherosclerotic calcification in the aorta. Bones are osteoporotic. There old healed rib fractures on the left. Multiple lytic appearing lesions are noted throughout the ribs and scapular regions. There is collapse of the T11 vertebral body. There is thoracolumbar dextroscoliosis. There are surgical clips the right axillary region. IMPRESSION: Areas of consolidation in the right base and right upper lobe near the apex. Asymmetric apical scarring on the left is stable. There is a persistent fairly small right pleural effusion. Findings felt represent bony metastases superimposed on osteoporosis. No adenopathy. There is aortic atherosclerosis. Cardiac silhouette within normal limits and stable. Electronically Signed   By: Lowella Grip III M.D.   On: 03/11/2016 14:23   Dg Thoracic Spine 2 View  Result Date: 03/11/2016 CLINICAL DATA:  Pain.  Cough.  History metastatic breast cancer. EXAM: THORACIC SPINE 2 VIEWS COMPARISON:  03/11/2016. FINDINGS: Diffuse severe osteopenia and degenerative change. Thoracolumbar spine scoliosis. Lower thoracic spine compression fracture, unchanged. IMPRESSION: Diffuse severe osteopenia and degenerative change. Lower thoracic vertebral body prominent compression fracture, no change. Electronically Signed   By: Marcello Moores  Register   On: 03/11/2016 14:22   Nm Pet Image Restag (ps) Skull Base To Thigh  Result Date: 03/16/2016 CLINICAL DATA:  Subsequent treatment strategy for metastatic breast cancer. EXAM: NUCLEAR MEDICINE  PET SKULL BASE TO THIGH TECHNIQUE: 6.6 mCi F-18 FDG was injected intravenously. Full-ring PET imaging was performed from the skull base to thigh after the radiotracer. CT data was obtained and used for attenuation correction and anatomic localization. FASTING BLOOD GLUCOSE:  Value: 124 mg/dl COMPARISON:  CT chest/abdomen/pelvis dated 02/18/2016 FINDINGS: NECK No hypermetabolic lymph nodes in the neck. CHEST 13 mm nodule along the anterior right lung base (series 8/image 51), max SUV 3.7, metastasis not excluded. Moderate right pleural effusion with associated right lower lobe atelectasis. Subpleural scarring with volume loss in the anterior right middle lobe (series 8/image 30) and medial right lower lobe (series 8/ image 47), non FDG avid. Biapical pleural-parenchymal scarring/radiation changes (series 8/image 11). Small left axillary nodes measuring up to 6 mm short axis. Hypermetabolic mediastinal lymphadenopathy, including: --9 mm short axis low right paratracheal node (series 4/ image 61), max SUV 4.2 (previously 8.7) --8 mm short axis subcarinal node (series 4/image 65), max SUV 4.4 (previously 9.9) --6 mm short axis right pericardial node (series 4/ image 70), max SUV 4.9 (previously 4.9) ABDOMEN/PELVIS 1.8 x 1.1 cm left adrenal nodule (series 4/image 99), max SUV 6.0 (previously 11.8), corresponding to known adrenal metastasis. Right adrenal gland demonstrates max SUV 4.3 (previously 8.4). No abnormal hypermetabolic activity within the liver, pancreas, or spleen. No hypermetabolic lymph nodes in the abdomen or pelvis. 5 mm short axis right pelvic sidewall node (series 4/image 146), non FDG avid (previously max SUV 5.6). Atherosclerotic calcifications the abdominal aorta and branch  vessels. Trace right pelvic ascites. SKELETON Degenerative changes of the visualized thoracolumbar spine. Right hip arthroplasty. Multifocal/ diffuse lytic/sclerotic metastases are suspected on CT. Additionally, there are suspected  multiple rib fractures, some of which are acute/ subacute (series 4/images 40, 49, 54, 60, and 78) and some of which are subacute/chronic (series 4/ images 86 and 92). Multifocal hypermetabolism is present involving the bilateral ribs, max SUV 4.9 on the left and 3.5 on the right, although it is unclear to what extent this reflects trauma versus neoplasm. IMPRESSION: 13 mm pleural-based nodule the anterior right lung base, suspicious for pulmonary metastasis. Mild mediastinal nodal metastases and bilateral adrenal metastases. Multifocal osseous metastases, with superimposed acute/subacute pathologic fractures involving multiple left ribs. When compared to the prior, the suspected pulmonary metastasis was not clearly evident on PET, although this may be technical. Otherwise, the hypermetabolism of most lesions has actually decreased. Electronically Signed   By: Julian Hy M.D.   On: 03/16/2016 16:27    Assessment/Plan  Multiple rib fractures pain, bruise left side, X-ray 04/02/16 L ribs, L spine, T spine showed acute mildly displaced left first to tenth rib fractures. T11 vertebral body 80% reduction in height. She stated her pain is minimal, able to take deep breath and move around her trunk w/o excessive pain, taking Tramadol tid presently, which is adequate. The denied trauma to her ribs, given her metastatic disease, it could be pathologic etiology of her fxs Update CBC and CMP  Compression fracture of thoracic vertebra pain, bruise left side, X-ray 04/02/16 L ribs, L spine, T spine showed acute mildly displaced left first to tenth rib fractures. T11 vertebral body 80% reduction in height. She stated her pain is minimal, able to take deep breath and move around her trunk w/o excessive pain, taking Tramadol tid presently, which is adequate.   Constipation Stable, continue MiraLax daily.   Insomnia Takes Zolpidem 5mg  prn at night for sleep  Depression Her mood is managed with Alprazolam tid.    Peripheral edema Trace edema BLE  Anxiety Mood is managed with Alprazolam tid.   Edema Trace edema BLE  Hyponatremia 03/22/16 Na 133  Anemia, chronic disease 03/22/16 Hgb 10.2   Family/ staff Communication: [place]  Labs/tests ordered: X-ray left ribs, T spine, L spine done 04/02/16, CBC CMP

## 2016-04-02 NOTE — Assessment & Plan Note (Signed)
Takes Zolpidem 5mg  prn at night for sleep

## 2016-04-02 NOTE — Telephone Encounter (Signed)
Family member states that patient woke up this morning with bruising along rib cage. [facility] a chest X-ray to evaluate further. Family member wanted you to be on the look out for those results as well.

## 2016-04-02 NOTE — Assessment & Plan Note (Signed)
pain, bruise left side, X-ray 04/02/16 L ribs, L spine, T spine showed acute mildly displaced left first to tenth rib fractures. T11 vertebral body 80% reduction in height. She stated her pain is minimal, able to take deep breath and move around her trunk w/o excessive pain, taking Tramadol tid presently, which is adequate.

## 2016-04-05 DIAGNOSIS — R7989 Other specified abnormal findings of blood chemistry: Secondary | ICD-10-CM | POA: Diagnosis not present

## 2016-04-05 LAB — CBC AND DIFFERENTIAL
HCT: 32 % — AB (ref 36–46)
Hemoglobin: 10.2 g/dL — AB (ref 12.0–16.0)
Platelets: 302 10*3/uL (ref 150–399)
WBC: 7.3 10^3/mL

## 2016-04-05 LAB — BASIC METABOLIC PANEL
BUN: 10 mg/dL (ref 4–21)
Creatinine: 0.7 mg/dL (ref <=1.1)
GLUCOSE: 150 mg/dL
POTASSIUM: 4.5 mmol/L (ref 3.4–5.3)
SODIUM: 131 mmol/L — AB (ref 137–147)

## 2016-04-05 LAB — HEPATIC FUNCTION PANEL
ALK PHOS: 333 U/L — AB (ref 25–125)
ALT: 30 U/L (ref 7–35)
AST: 37 U/L — AB (ref 13–35)
Bilirubin, Total: 1.5 mg/dL

## 2016-04-06 ENCOUNTER — Other Ambulatory Visit: Payer: Self-pay | Admitting: *Deleted

## 2016-04-08 DIAGNOSIS — R7989 Other specified abnormal findings of blood chemistry: Secondary | ICD-10-CM | POA: Diagnosis not present

## 2016-04-08 LAB — BASIC METABOLIC PANEL
BUN: 10 mg/dL (ref 4–21)
Creatinine: 0.7 mg/dL (ref <=1.1)
GLUCOSE: 109 mg/dL
Potassium: 4.7 mmol/L (ref 3.4–5.3)
Sodium: 133 mmol/L — AB (ref 137–147)

## 2016-04-08 LAB — HEPATIC FUNCTION PANEL
ALT: 35 U/L (ref 7–35)
AST: 46 U/L — AB (ref 13–35)
Alkaline Phosphatase: 332 U/L — AB (ref 25–125)
Bilirubin, Total: 0.5 mg/dL

## 2016-04-08 LAB — CBC AND DIFFERENTIAL
HEMATOCRIT: 32 % — AB (ref 36–46)
Hemoglobin: 10.4 g/dL — AB (ref 12.0–16.0)
PLATELETS: 298 10*3/uL (ref 150–399)
WBC: 8 10*3/mL

## 2016-04-13 ENCOUNTER — Other Ambulatory Visit: Payer: Medicare Other

## 2016-04-13 ENCOUNTER — Telehealth: Payer: Self-pay | Admitting: *Deleted

## 2016-04-13 ENCOUNTER — Other Ambulatory Visit: Payer: Self-pay | Admitting: *Deleted

## 2016-04-13 NOTE — Telephone Encounter (Signed)
This RN received VM from pt's dtr, Wilford Grist, stating pt has been c/o am nausea since starting the anastrazole.  " I joked and said could you be pregnant but then I thought could the pills due to dealing with estrogen be causing the nausea "  Return call number left as [phone].  Call returned and obtained identified VM- detailed message left requesting return call to this RN as well as requesting further information related to when pt is taking med and if pt taking with food.  Will await return call.  Next scheduled appointment for 4/16.

## 2016-04-20 ENCOUNTER — Ambulatory Visit: Payer: Medicare Other | Admitting: Oncology

## 2016-04-21 ENCOUNTER — Telehealth: Payer: Self-pay | Admitting: Oncology

## 2016-04-21 NOTE — Telephone Encounter (Signed)
Added lab appointments to each injection appointment from April thru August. Spoke with patient dtr re new times.

## 2016-04-22 ENCOUNTER — Ambulatory Visit (INDEPENDENT_AMBULATORY_CARE_PROVIDER_SITE_OTHER): Payer: Medicare Other | Admitting: Podiatry

## 2016-04-22 DIAGNOSIS — M79676 Pain in unspecified toe(s): Secondary | ICD-10-CM

## 2016-04-22 DIAGNOSIS — B351 Tinea unguium: Secondary | ICD-10-CM | POA: Diagnosis not present

## 2016-04-22 NOTE — Progress Notes (Signed)
Complaint:  Visit Type: Patient returns to my office for continued preventative foot care services. Complaint: Patient states" my nails have grown long and thick and become painful to walk and wear shoes" Patient has been diagnosed with DM with no foot complications. The patient presents for preventative foot care services. No changes to ROS  Podiatric Exam: Vascular: dorsalis pedis and posterior tibial pulses are palpable bilateral. Capillary return is immediate. Temperature gradient is WNL. Skin turgor WNL  Sensorium: Normal Semmes Weinstein monofilament test. Normal tactile sensation bilaterally. Nail Exam: Pt has thick disfigured discolored nails with subungual debris noted bilateral entire nail hallux through fifth toenails Ulcer Exam: There is no evidence of ulcer or pre-ulcerative changes or infection. Orthopedic Exam: Muscle tone and strength are WNL. No limitations in general ROM. No crepitus or effusions noted. Foot type and digits show no abnormalities. Bony prominences are unremarkable. Skin: No Porokeratosis. No infection or ulcers  Diagnosis:  Onychomycosis, , Pain in right toe, pain in left toes. Pincer toenails  Treatment & Plan Procedures and Treatment: Consent by patient was obtained for treatment procedures. The patient understood the discussion of treatment and procedures well. All questions were answered thoroughly reviewed. Debridement of mycotic and hypertrophic toenails, 1 through 5 bilateral and clearing of subungual debris. No ulceration, no infection noted.  Return Visit-Office Procedure: Patient instructed to return to the office for a follow up visit 3 months for continued evaluation and treatment.    Chazlyn Cude DPM 

## 2016-04-26 ENCOUNTER — Ambulatory Visit (HOSPITAL_BASED_OUTPATIENT_CLINIC_OR_DEPARTMENT_OTHER): Payer: Medicare Other | Admitting: Adult Health

## 2016-04-26 ENCOUNTER — Ambulatory Visit: Payer: Medicare Other

## 2016-04-26 ENCOUNTER — Other Ambulatory Visit (HOSPITAL_BASED_OUTPATIENT_CLINIC_OR_DEPARTMENT_OTHER): Payer: Medicare Other

## 2016-04-26 ENCOUNTER — Telehealth: Payer: Self-pay

## 2016-04-26 ENCOUNTER — Encounter: Payer: Self-pay | Admitting: Adult Health

## 2016-04-26 VITALS — BP 144/63 | HR 85 | Temp 98.0°F | Resp 16 | Wt 130.3 lb

## 2016-04-26 DIAGNOSIS — R110 Nausea: Secondary | ICD-10-CM | POA: Diagnosis not present

## 2016-04-26 DIAGNOSIS — G893 Neoplasm related pain (acute) (chronic): Secondary | ICD-10-CM

## 2016-04-26 DIAGNOSIS — C7971 Secondary malignant neoplasm of right adrenal gland: Secondary | ICD-10-CM | POA: Diagnosis not present

## 2016-04-26 DIAGNOSIS — R682 Dry mouth, unspecified: Secondary | ICD-10-CM | POA: Diagnosis not present

## 2016-04-26 DIAGNOSIS — C50919 Malignant neoplasm of unspecified site of unspecified female breast: Secondary | ICD-10-CM

## 2016-04-26 DIAGNOSIS — C50911 Malignant neoplasm of unspecified site of right female breast: Secondary | ICD-10-CM | POA: Diagnosis not present

## 2016-04-26 DIAGNOSIS — Z170 Estrogen receptor positive status [ER+]: Principal | ICD-10-CM

## 2016-04-26 DIAGNOSIS — C7951 Secondary malignant neoplasm of bone: Secondary | ICD-10-CM | POA: Diagnosis not present

## 2016-04-26 DIAGNOSIS — C7972 Secondary malignant neoplasm of left adrenal gland: Secondary | ICD-10-CM

## 2016-04-26 LAB — COMPREHENSIVE METABOLIC PANEL
ALBUMIN: 3.4 g/dL — AB (ref 3.5–5.0)
ALT: 25 U/L (ref 0–55)
AST: 36 U/L — AB (ref 5–34)
Alkaline Phosphatase: 264 U/L — ABNORMAL HIGH (ref 40–150)
Anion Gap: 8 mEq/L (ref 3–11)
BUN: 11.3 mg/dL (ref 7.0–26.0)
CALCIUM: 7.7 mg/dL — AB (ref 8.4–10.4)
CHLORIDE: 101 meq/L (ref 98–109)
CO2: 27 mEq/L (ref 22–29)
CREATININE: 0.9 mg/dL (ref 0.6–1.1)
EGFR: 59 mL/min/{1.73_m2} — ABNORMAL LOW (ref >90)
Glucose: 125 mg/dl (ref 70–140)
Potassium: 4.5 mEq/L (ref 3.5–5.1)
Sodium: 135 mEq/L — ABNORMAL LOW (ref 136–145)
TOTAL PROTEIN: 6.2 g/dL — AB (ref 6.4–8.3)
Total Bilirubin: 0.37 mg/dL (ref 0.20–1.20)

## 2016-04-26 LAB — CBC WITH DIFFERENTIAL/PLATELET
BASO%: 0.6 % (ref 0.0–2.0)
Basophils Absolute: 0 10*3/uL (ref 0.0–0.1)
EOS%: 2.4 % (ref 0.0–7.0)
Eosinophils Absolute: 0.2 10*3/uL (ref 0.0–0.5)
HEMATOCRIT: 34.6 % — AB (ref 34.8–46.6)
HEMOGLOBIN: 11.3 g/dL — AB (ref 11.6–15.9)
LYMPH#: 2.3 10*3/uL (ref 0.9–3.3)
LYMPH%: 35.2 % (ref 14.0–49.7)
MCH: 30.2 pg (ref 25.1–34.0)
MCHC: 32.7 g/dL (ref 31.5–36.0)
MCV: 92.4 fL (ref 79.5–101.0)
MONO#: 0.7 10*3/uL (ref 0.1–0.9)
MONO%: 10.9 % (ref 0.0–14.0)
NEUT%: 50.9 % (ref 38.4–76.8)
NEUTROS ABS: 3.3 10*3/uL (ref 1.5–6.5)
Platelets: 230 10*3/uL (ref 145–400)
RBC: 3.74 10*6/uL (ref 3.70–5.45)
RDW: 16.4 % — ABNORMAL HIGH (ref 11.2–14.5)
WBC: 6.5 10*3/uL (ref 3.9–10.3)

## 2016-04-26 MED ORDER — ONDANSETRON HCL 4 MG PO TABS: 4.0000 mg | ORAL_TABLET | Freq: Three times a day (TID) | ORAL | 0 refills | Status: DC | PRN

## 2016-04-26 NOTE — Progress Notes (Unsigned)
Calcium noted at 7.7 today.  Discussed results with Brandy from pharmacy, with corrected calcium calculations, pt is still not within accepted parameters. Dr. Jana Hakim  Consulted.  Magrinat requested that xgeva be hled today and pateint started on additional calcium supplements.

## 2016-04-26 NOTE — Progress Notes (Signed)
The drugs is already [facility]  [phone] [phone]     ID: Cassandra Glenn DOB: Dec 12, 1920  MR#: [id number]  ZHY#:[id number]  Patient Care Team: Estill Dooms, MD as PCP - General (Internal Medicine) Zihlman Man Otho Darner, NP as Nurse Practitioner (Nurse Practitioner) Shon Hough, MD as Consulting Physician (Ophthalmology) Molli Posey, MD as Consulting Physician (Obstetrics and Gynecology) Gerarda Fraction, MD as Consulting Physician (Ophthalmology) Lorelle Gibbs, MD as Consulting Physician (Radiology) Melrose Nakayama, MD as Consulting Physician (Orthopedic Surgery) Gerarda Fraction, MD as Referring Physician (Ophthalmology) Ardis Hughs, MD as Attending Physician (Urology) Chauncey Cruel, MD as Consulting Physician (Oncology) Scot Dock, NP OTHER MD:  CHIEF COMPLAINT: 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  Cassandra Glenn is doing well.  Cassandra Glenn has had some constant nausea and acid reflux since starting the anastrozole.  Cassandra Glenn denies any new pain, or aches.  Cassandra Glenn is taking anastrozole daily.      REVIEW OF SYSTEMS: Other than nausea, Cassandra Glenn has a dry mouth.  Cassandra Glenn is using biotene.  Cassandra Glenn is taking one calcium bid.  Cassandra Glenn does not eat a lot of calcium rich foods.  Cassandra Glenn denies fevers, chills, new pain, constipation, diarrhea, mouth pain or lesions, or any further concerns.  A detailed ROS was otherwise non contributory.    BREAST CANCER HISTORY: From the original intake note:  I saw Cassandra Glenn approximately approximately 13 years ago when Cassandra Glenn was diagnosed with right-sided invasive breast cancer, which was treated with radiation and tamoxifen for 5 years. Cassandra Glenn also had a noninvasive breast cancer removed from the left breast at the same time.  More recently Cassandra Glenn had a chest x-ray 10/22/2015 for  evaluation of a cough. The film showed a variety of incidental findings including atherosclerosis of the thoracic aorta and an old lower thoracic compression fracture. There was also however a right apical density which required further evaluation. Accordingly on 10/27/2015 Cassandra Glenn had a chest CT scan which found chronic areas of scarring in both lungs, not significantly changed from November 2015. However right middle lobe pleural thickening appeared worse, and there was increased nodular pleural thickening along the azygo-esophageal recess, suggestive of an indolent mesothelioma. There was a small cluster of nodules at the medial right lung base which was new from the prior study. There were no bony metastases but there were multiple chronic compression deformities and degenerative change.   Given these results, a PET scan was obtained 11/07/2015. There was a hypermetabolic right paratracheal lymph node measuring 1.2 cm, but no additional mediastinal adenopathy. Along the pleural surface of the azygo-esophageal region there was increased metabolic activity with an SUV max of 20.9. There was also intense metabolic activity associated with a thickened left adrenal gland, with an SUV max of 10.8. There was some hypermetabolic activity associated with the right adrenal gland. There were again no bony metastatic disease findings.  With this information the patient returns for further evaluation and treatment.   PAST MEDICAL HISTORY: Past Medical History:  Diagnosis Date  . Abnormal PET scan of mediastinum 11/11/2015  . Abnormalities of the hair 02/29/2012  . Arthritis   . BPV (benign positional vertigo)   . Cancer ([facility])   . Cancer antigen 125 (CA 125) elevation 11/27/2013  . Candidiasis of skin and nails 03/19/2011  . 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 Chorioretinal scar, macular 01/23/2015  .  Closed fracture of base of neck of femur ([place]) 03/18/2011  . Compression fracture of thoracic vertebra ([facility])  11/24/2012  . Contact dermatitis and other eczema, due to unspecified cause 05/07/2011  . Depression 07/23/2014  . Diverticulosis of colon (without mention of hemorrhage) 03/19/2011  . Edema 05/07/2011  . HOH (hard of hearing)   . Hypertension 02/24/2016  . Hypopotassemia 03/26/2011  . Hyposmolality and/or hyponatremia 03/19/2011  . Hypotension, unspecified 03/18/2011  . IBS (irritable bowel syndrome) 11/27/2013  . Idiopathic scoliosis 01/27/2016  . Impaired fasting glucose 03/19/2011  . Insomnia, unspecified 03/19/2011  . Kidney infection   . Lumbago 03/19/2011  . Macular degeneration   . Malignant neoplasm of breast (female), unspecified site 10/28/2002  . Mass of right lung 10/23/2015  . Metastasis to adrenal gland ([place]) 11/21/2015   Breast cancer  . Metastatic breast cancer ([place]) 10/27/2004  . Nonexudative age-related macular degeneration 12/10/2010  . Osteoporosis 08/06/2014  . Osteoporosis, unspecified 03/19/2011  . Other malaise and fatigue 03/19/2011  . Pain in joint, pelvic region and thigh 03/19/2011  . Primary osteoarthritis of right hip 05/28/2014   TOTAL HIP ARTHROPLASTY ANTERIOR APPROACH HARDWARE REMOVAL on 05/28/2014   . Purpura senilis ([place]) 07/31/2013  . Radiculopathy, cervical 03/23/2016   Right C6  . Rib fractures   . Right bundle branch block 08/30/2011  . Shortness of breath   . Spasm of muscle 03/19/2011  . Spinal stenosis, lumbar region, with neurogenic claudication 11/02/2011  . TMJ click 1/61/0960  . Trigger finger (acquired) 11/02/2011  . Unspecified constipation 03/19/2011  . Urinary frequency 04/24/2013  . UTI (urinary tract infection) 04/16/2013   04/16/13 pseudomonas aeruginosa: tx with Cipro 06/15/14 P. Aeruginosa Fortaz 594m IM q12h x 7 days.     . Vaginitis and vulvovaginitis 02/29/2012  . Vertigo 08/26/2015    PAST SURGICAL HISTORY: Past Surgical History:  Procedure Laterality Date  . BREAST SURGERY Bilateral 2005-10/27/2004   lumpectomy-  Streck,MD  . CATARACT EXTRACTION  2010   bialteral  . EYE SURGERY     cataract  . FRACTURE SURGERY Left 09/1980   ankle  . HARDWARE REMOVAL Right 05/28/2014   Procedure: HARDWARE REMOVAL;  Surgeon: PMelrose Nakayama MD;  [facility]  Service: Orthopedics;  Laterality: Right;  . HIP PINNING,CANNULATED  03/08/2011   Procedure: CANNULATED HIP PINNING;  Surgeon: JJohnn Hai MD;  Location: [facility];  Service: Orthopedics;  Laterality: Right;  . ORIF HIP FRACTURE Right 03/08/2011   Bean,MD  . SKIN CANCER EXCISION Left 10/12/2012   lower leg Dr. [place] . SKIN LESION EXCISION Right 02/04/2011   Abdomen lesion spongiotic dermatitis-Taffeen, MD  . SQUAMOUS CELL CARCINOMA EXCISION Right 02/04/2011   forearm-[place] MD  . TONSILLECTOMY    . TOTAL HIP ARTHROPLASTY Right 05/28/2014   Procedure: TOTAL HIP ARTHROPLASTY ANTERIOR APPROACH;  Surgeon: PMelrose Nakayama MD;  [facility] [facility]  Service: Orthopedics;  Laterality: Right;    FAMILY HISTORY Family History  Problem Relation Age of Onset  . Heart disease Mother   . Cancer Father   . Emphysema Brother   . Alzheimer's disease Brother   The patient's father died at the age of 739with colon cancer. The patient's mother died at the age of 873from heart disease the patient had 2 brothers, no sisters. Both brothers have died, one from emphysema and one from dementia.  GYNECOLOGIC HISTORY:  No LMP recorded. Patient is postmenopausal. Menarche age 83 first live birth age 83 the patient [place]  P2. Cassandra Glenn stopped having periods around age 35. Cassandra Glenn never took hormone replacement.  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 Cassandra Glenn is widowed. Cassandra Glenn lives in independent living section of friend's home [place]. 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 Daughter [name] lives in [place] and is Web designer at [employer]. The  patient has 5 grandchildren and 2 step grandchildren. Cassandra Glenn has one great grandchild and one on the way. Cassandra Glenn attends [employer]    ADVANCED DIRECTIVES: Aneri has named both her daughters as joint health care power of attorney. Cassandra Glenn tells me Cassandra Glenn has a living will "somewhere in the house" and has a DO NOT RESUSCITATE order written at friends homes.   HEALTH MAINTENANCE: Social History  Substance Use Topics  . Smoking status: Never Smoker  . Smokeless tobacco: Never Used  . Alcohol use 0.0 oz/week     Comment: occasional glass     Colonoscopy:  PAP:  Bone density:   No Known Allergies  Current Outpatient Prescriptions  Medication Sig Dispense Refill  . acetaminophen (TYLENOL) 500 MG tablet Take 1,000 mg by mouth every 6 (six) hours as needed for moderate pain.     Marland Kitchen ALPRAZolam (XANAX) 0.5 MG tablet One up to 3 times daily for anxiety 100 tablet 0  . anastrozole (ARIMIDEX) 1 MG tablet Take 1 tablet (1 mg total) by mouth daily. 90 tablet 4  . aspirin 81 MG chewable tablet Chew 81 mg by mouth every morning.     . Bilberry 1000 MG CAPS Take 1,000 mg by mouth every morning.     . Calcium Carbonate-Vitamin D (CALCIUM 600/VITAMIN D PO) Take 600 mg by mouth 2 (two) times daily.     . Calcium Polycarbophil (FIBERCON PO) Take 1 tablet by mouth 2 (two) times daily.     . cholecalciferol (VITAMIN D) 400 UNITS TABS Take 400 Units by mouth every morning.     Marland Kitchen CRANBERRY EXTRACT PO Take 1 tablet by mouth every morning.     . fish oil-omega-3 fatty acids 1000 MG capsule Take 1 g by mouth every morning.     . Glucosamine-Chondroitin (GLUCOSAMINE CHONDR COMPLEX PO) Take 1 tablet by mouth 2 (two) times daily. for joints    . guaiFENesin (MUCINEX) 600 MG 12 hr tablet Take 600 mg by mouth 2 (two) times daily.    Marland Kitchen ibuprofen (ADVIL,MOTRIN) 200 MG tablet Take 200 mg by mouth. Take 2 tablets as needed for inflammatory pain    . meclizine (ANTIVERT) 25 MG tablet Take 1 tablet (25 mg total) by mouth  3 (three) times daily as needed for dizziness. (Patient not taking: Reported on 04/26/2016) 30 tablet 0  . Multiple Vitamins-Minerals (PRESERVISION/LUTEIN) CAPS Take 1 capsule by mouth 2 (two) times daily.    Marland Kitchen nystatin (MYCOSTATIN/NYSTOP) powder Apply to affected skin daily to treat yeast 45 g 4  . polyethylene glycol (MIRALAX / GLYCOLAX) packet Take 17 g by mouth daily.    . Probiotic Product (CULTURELLE PRO-WELL PO) Take by mouth. Take one probiotic tablet in morning    . traMADol (ULTRAM) 50 MG tablet Take 0.5-1 tablets (25-50 mg total) by mouth every 6 (six) hours as needed. 60 tablet 0  . trimethoprim (TRIMPEX) 100 MG tablet Take 100 mg by mouth at bedtime.    Marland Kitchen zolpidem (AMBIEN) 5 MG tablet Take one half tablet at bedtime if needed for rest 15 tablet  5   No current facility-administered medications for this visit.     OBJECTIVE:   Vitals:   04/26/16 1440  BP: (!) 144/63  Pulse: 85  Resp: 16  Temp: 98 F (36.7 C)     Body mass index is 24.62 kg/m.    ECOG FS:1 - Symptomatic but completely ambulatory   GENERAL: Patient is a well appearing elderly female in no acute distress HEENT:  Sclerae anicteric.  Oropharynx clear and moist. No ulcerations or evidence of oropharyngeal candidiasis. Neck is supple.  NODES:  No cervical, supraclavicular, or axillary lymphadenopathy palpated.  BREAST EXAM:  Deferred. LUNGS:  Clear to auscultation bilaterally.  No wheezes or rhonchi. HEART:  Regular rate and rhythm. No murmur appreciated. ABDOMEN:  Soft, nontender.  Positive, normoactive bowel sounds. No organomegaly palpated. MSK:  No focal spinal tenderness to palpation. Full range of motion bilaterally in the upper extremities. EXTREMITIES:  No peripheral edema.   SKIN:  Clear with no obvious rashes or skin changes.  NEURO:  Nonfocal. Well oriented.  Appropriate affect.     LAB RESULTS:  CMP     Component Value Date/Time   NA 133 (A) 04/08/2016   NA 133 (L) 03/22/2016 1445   K 4.7  04/08/2016   K 4.7 03/22/2016 1445   CL 98 (L) 02/18/2016 1204   CO2 26 03/22/2016 1445   GLUCOSE 105 03/22/2016 1445   BUN 10 04/08/2016   BUN 11.3 03/22/2016 1445   CREATININE 0.7 04/08/2016   CREATININE 0.8 03/22/2016 1445   CALCIUM 8.3 (L) 03/22/2016 1445   PROT 5.7 (L) 03/22/2016 1445   ALBUMIN 3.1 (L) 03/22/2016 1445   AST 46 (A) 04/08/2016   AST 49 (H) 03/22/2016 1445   ALT 35 04/08/2016   ALT 45 03/22/2016 1445   ALKPHOS 332 (A) 04/08/2016   ALKPHOS 358 (H) 03/22/2016 1445   BILITOT 0.29 03/22/2016 1445   GFRNONAA >60 02/18/2016 1204   GFRNONAA 63 01/19/2016 0800   GFRAA >60 02/18/2016 1204   GFRAA 72 01/19/2016 0800    INo results found for: SPEP, UPEP  Lab Results  Component Value Date   WBC 6.5 04/26/2016   NEUTROABS 3.3 04/26/2016   HGB 11.3 (L) 04/26/2016   HCT 34.6 (L) 04/26/2016   MCV 92.4 04/26/2016   PLT 230 04/26/2016      Chemistry      Component Value Date/Time   NA 133 (A) 04/08/2016   NA 133 (L) 03/22/2016 1445   K 4.7 04/08/2016   K 4.7 03/22/2016 1445   CL 98 (L) 02/18/2016 1204   CO2 26 03/22/2016 1445   BUN 10 04/08/2016   BUN 11.3 03/22/2016 1445   CREATININE 0.7 04/08/2016   CREATININE 0.8 03/22/2016 1445   GLU 109 04/08/2016      Component Value Date/Time   CALCIUM 8.3 (L) 03/22/2016 1445   ALKPHOS 332 (A) 04/08/2016   ALKPHOS 358 (H) 03/22/2016 1445   AST 46 (A) 04/08/2016   AST 49 (H) 03/22/2016 1445   ALT 35 04/08/2016   ALT 45 03/22/2016 1445   BILITOT 0.29 03/22/2016 1445       Lab Results  Component Value Date   LABCA2 19 11/07/2007    No components found for: [id number]  No results for input(s): INR in the last 168 hours.  Urinalysis    Component Value Date/Time   COLORURINE STRAW (A) 02/18/2016 1214   APPEARANCEUR CLEAR 02/18/2016 1214   LABSPEC 1.015 03/11/2016 1307   PHURINE  6.0 03/11/2016 1307   PHURINE 6.0 02/18/2016 1214   GLUCOSEU Negative 03/11/2016 1307   HGBUR Negative 03/11/2016 1307    HGBUR NEGATIVE 02/18/2016 1214   BILIRUBINUR Negative 03/11/2016 1307   KETONESUR Negative 03/11/2016 1307   KETONESUR NEGATIVE 02/18/2016 1214   PROTEINUR < 30 03/11/2016 1307   PROTEINUR NEGATIVE 02/18/2016 1214   UROBILINOGEN 0.2 03/11/2016 1307   NITRITE Color Interference 03/11/2016 1307   NITRITE NEGATIVE 02/18/2016 1214   LEUKOCYTESUR Color Interference 03/11/2016 1307     STUDIES: No results found.  ELIGIBLE FOR AVAILABLE RESEARCH PROTOCOL: no  ASSESSMENT: 16 y.o. [place] woman residing at friends homes [place]  (1) status post right lumpectomy on 10/27/2004 for an invasive lobular carcinoma, grade 1, estrogen and progesterone receptor positive, Ki-67 3%, HER-2 negative, and so T2N2, treated with radiation and tamoxifen for 5 years  (a) Also status post left lumpectomy for ductal carcinoma in situ  METASTATIC DISEASE: November 2017 (2) chest CT scan and PET scan obtained October 2017 show a hot 1.2 cm right paratracheal lymph node, a rim of activity along the pleural surface of the a a zygo-esophageal region, and bilateral adrenal metastases, left greater than right   (a) biopsy of the left adrenal gland 12/03/2015 shows metastatic carcinoma, estrogen and progesterone receptor positive, HER-2 nonamplified  (3) tamoxifen started 01/20/2015, discontinued March 2018 with concerns regarding progression  (4) anastrozole started March 2018  (a) Xgeva to be started 03/29/2016, repeated monthly  PLAN: Casady is doing well today.  Cassandra Glenn will continue anastrozole.  We discussed her taking prevacid or omeprazole over the counter for a few days, and in addition taking tums for the acid issues.  I also prescribed zofran for her intermittent nausea.  Her calcium is 7.7 and Cassandra Glenn cannot receive xgeva today.  Ir eviewed this with them.  Cassandra Glenn will increase her calcium intake.  I gave her a handout of foods rich in calcium Cassandra Glenn can eat/drink.  Cassandra Glenn will continue taking calcium BID with vitamin d, in  addition to tums daily.    Ava and her daughter have a good understanding of this plan. Cassandra Glenn agrees with it. Cassandra Glenn knows to call for any problems that may develop before her repeat visit here.  Cassandra Glenn has PET scan scheduled in two weeks, and an appt with Dr. Jana Hakim, two weeks following that.    A total of (30) minutes of face-to-face time was spent with this patient with greater than 50% of that time in counseling and care-coordination.  Scot Dock, NP   04/26/2016 2:47 PM Medical Oncology and Hematology [facility] [address] [place], [place] [address] Tel. [phone]    Fax. [phone]

## 2016-04-26 NOTE — Patient Instructions (Signed)
Bone Health Bones protect organs, store calcium, and anchor muscles. Good health habits, such as eating nutritious foods and exercising regularly, are important for maintaining healthy bones. They can also help to prevent a condition that causes bones to lose density and become weak and brittle (osteoporosis). Why is bone mass important? Bone mass refers to the amount of bone tissue that you have. The higher your bone mass, the stronger your bones. An important step toward having healthy bones throughout life is to have strong and dense bones during childhood. A young adult who has a high bone mass is more likely to have a high bone mass later in life. Bone mass at its greatest it is called peak bone mass. A large decline in bone mass occurs in older adults. In women, it occurs about the time of menopause. During this time, it is important to practice good health habits, because if more bone is lost than what is replaced, the bones will become less healthy and more likely to break (fracture). If you find that you have a low bone mass, you may be able to prevent osteoporosis or further bone loss by changing your diet and lifestyle. How can I find out if my bone mass is low? Bone mass can be measured with an X-ray test that is called a bone mineral density (BMD) test. This test is recommended for all women who are age 65 or older. It may also be recommended for men who are age 70 or older, or for people who are more likely to develop osteoporosis due to:  Having bones that break easily.  Having a long-term disease that weakens bones, such as kidney disease or rheumatoid arthritis.  Having menopause earlier than normal.  Taking medicine that weakens bones, such as steroids, thyroid hormones, or hormone treatment for breast cancer or prostate cancer.  Smoking.  Drinking three or more alcoholic drinks each day. What are the nutritional recommendations for healthy bones? To have healthy bones, you need  to get enough of the right minerals and vitamins. Most nutrition experts recommend getting these nutrients from the foods that you eat. Nutritional recommendations vary from person to person. Ask your health care provider what is healthy for you. Here are some general guidelines. Calcium Recommendations  Calcium is the most important (essential) mineral for bone health. Most people can get enough calcium from their diet, but supplements may be recommended for people who are at risk for osteoporosis. Good sources of calcium include:  Dairy products, such as low-fat or nonfat milk, cheese, and yogurt.  Dark green leafy vegetables, such as bok choy and broccoli.  Calcium-fortified foods, such as orange juice, cereal, bread, soy beverages, and tofu products.  Nuts, such as almonds. Follow these recommended amounts for daily calcium intake:  Children, age 1?3: 700 mg.  Children, age 4?8: 1,000 mg.  Children, age 9?13: 1,300 mg.  Teens, age 14?18: 1,300 mg.  Adults, age 19?50: 1,000 mg.  Adults, age 51?70:  Men: 1,000 mg.  Women: 1,200 mg.  Adults, age 71 or older: 1,200 mg.  Pregnant and breastfeeding females:  Teens: 1,300 mg.  Adults: 1,000 mg. Vitamin D Recommendations  Vitamin D is the most essential vitamin for bone health. It helps the body to absorb calcium. Sunlight stimulates the skin to make vitamin D, so be sure to get enough sunlight. If you live in a cold climate or you do not get outside often, your health care provider may recommend that you take vitamin D   supplements. Good sources of vitamin D in your diet include:  Egg yolks.  Saltwater fish.  Milk and cereal fortified with vitamin D. Follow these recommended amounts for daily vitamin D intake:  Children and teens, age 1?18: 600 international units.  Adults, age 50 or younger: 400-800 international units.  Adults, age 51 or older: 800-1,000 international units. Other Nutrients  Other nutrients for bone  health include:  Phosphorus. This mineral is found in meat, poultry, dairy foods, nuts, and legumes. The recommended daily intake for adult men and adult women is 700 mg.  Magnesium. This mineral is found in seeds, nuts, dark green vegetables, and legumes. The recommended daily intake for adult men is 400?420 mg. For adult women, it is 310?320 mg.  Vitamin K. This vitamin is found in green leafy vegetables. The recommended daily intake is 120 mg for adult men and 90 mg for adult women. What type of physical activity is best for building and maintaining healthy bones? Weight-bearing and strength-building activities are important for building and maintaining peak bone mass. Weight-bearing activities cause muscles and bones to work against gravity. Strength-building activities increases muscle strength that supports bones. Weight-bearing and muscle-building activities include:  Walking and hiking.  Jogging and running.  Dancing.  Gym exercises.  Lifting weights.  Tennis and racquetball.  Climbing stairs.  Aerobics. Adults should get at least 30 minutes of moderate physical activity on most days. Children should get at least 60 minutes of moderate physical activity on most days. Ask your health care provide what type of exercise is best for you. Where can I find more information? For more information, check out the following websites:  National Osteoporosis Foundation: http://nof.org/learn/basics  National Institutes of Health: http://www.niams.nih.gov/Health_Info/Bone/Bone_Health/bone_health_for_life.asp This information is not intended to replace advice given to you by your health care provider. Make sure you discuss any questions you have with your health care provider. Document Released: 03/20/2003 Document Revised: 07/18/2015 Document Reviewed: 01/02/2014 Elsevier Interactive Patient Education  2017 Elsevier Inc.  

## 2016-04-26 NOTE — Telephone Encounter (Signed)
Enid Derry at [facility] called stating Cassandra Glenn had told pt to use prilosec. Enid Derry is requesting an order be faxed to [facility] at [phone]

## 2016-04-27 LAB — CANCER ANTIGEN 27.29: CAN 27.29: 158.7 U/mL — AB (ref 0.0–38.6)

## 2016-04-28 ENCOUNTER — Encounter: Payer: Self-pay | Admitting: *Deleted

## 2016-04-28 NOTE — Progress Notes (Signed)
[place] requested an order for Prilosec to be faxed to [phone]. Per Mendel Ryder, NP order faxed and confirmation of fax received.

## 2016-05-04 ENCOUNTER — Encounter: Payer: Self-pay | Admitting: Nurse Practitioner

## 2016-05-04 ENCOUNTER — Non-Acute Institutional Stay: Payer: Medicare Other | Admitting: Nurse Practitioner

## 2016-05-04 DIAGNOSIS — I10 Essential (primary) hypertension: Secondary | ICD-10-CM | POA: Diagnosis not present

## 2016-05-04 DIAGNOSIS — K219 Gastro-esophageal reflux disease without esophagitis: Secondary | ICD-10-CM | POA: Diagnosis not present

## 2016-05-04 DIAGNOSIS — K5900 Constipation, unspecified: Secondary | ICD-10-CM

## 2016-05-04 DIAGNOSIS — G4700 Insomnia, unspecified: Secondary | ICD-10-CM

## 2016-05-04 DIAGNOSIS — R609 Edema, unspecified: Secondary | ICD-10-CM | POA: Diagnosis not present

## 2016-05-04 DIAGNOSIS — S2242XG Multiple fractures of ribs, left side, subsequent encounter for fracture with delayed healing: Secondary | ICD-10-CM | POA: Diagnosis not present

## 2016-05-04 DIAGNOSIS — C50919 Malignant neoplasm of unspecified site of unspecified female breast: Secondary | ICD-10-CM

## 2016-05-04 DIAGNOSIS — F3341 Major depressive disorder, recurrent, in partial remission: Secondary | ICD-10-CM | POA: Diagnosis not present

## 2016-05-04 DIAGNOSIS — D638 Anemia in other chronic diseases classified elsewhere: Secondary | ICD-10-CM

## 2016-05-04 NOTE — Assessment & Plan Note (Signed)
f/u oncology, continue Arimidex.

## 2016-05-04 NOTE — Assessment & Plan Note (Addendum)
c/o nauseated all the time, c/o poor appetite as well,  Zofran prn helps, but she doesn't always request it, will schedule Zofran 4mg  qam, , adding Mirtazapine 7.5mg  qhs, continue Omeprazole 20mg  daily.

## 2016-05-04 NOTE — Assessment & Plan Note (Signed)
Normalized, no med

## 2016-05-04 NOTE — Assessment & Plan Note (Signed)
04/08/16 Na 133, K 4.7, Bun 10, creat 0.71, wbc 8.0, Hgb 10.4, plt 298, alk phos 332Trace edema BLE, chronic, monitor weight daily ac breakfast, then re-evaluate, monitor for s/s of developing CHF

## 2016-05-04 NOTE — Assessment & Plan Note (Signed)
Takes Zolpidem 5mg  prn at night for sleep

## 2016-05-04 NOTE — Assessment & Plan Note (Signed)
Stable, continue MiraLax daily.  

## 2016-05-04 NOTE — Assessment & Plan Note (Signed)
Her mood is managed with Alprazolam tid.

## 2016-05-04 NOTE — Assessment & Plan Note (Signed)
Hgb 10.4 04/08/16

## 2016-05-04 NOTE — Assessment & Plan Note (Signed)
X-ray 04/02/16 L ribs, L spine, T spine showed acute mildly displaced left first to tenth rib fractures. T11 vertebral body 80% reduction in height.  Pain is managed with Tramadol.

## 2016-05-04 NOTE — Progress Notes (Signed)
Location:  [facility] Room Number: 8 Place of Service:  ALF [phone]) Provider:  Mast, Manxie  NP  Jeanmarie Hubert, MD  Patient Care Team: Estill Dooms, MD as PCP - General (Internal Medicine) Jordan Man Otho Darner, NP as Nurse Practitioner (Nurse Practitioner) Shon Hough, MD as Consulting Physician (Ophthalmology) Molli Posey, MD as Consulting Physician (Obstetrics and Gynecology) Gerarda Fraction, MD as Consulting Physician (Ophthalmology) Lorelle Gibbs, MD as Consulting Physician (Radiology) Melrose Nakayama, MD as Consulting Physician (Orthopedic Surgery) Gerarda Fraction, MD as Referring Physician (Ophthalmology) Ardis Hughs, MD as Attending Physician (Urology) Chauncey Cruel, MD as Consulting Physician (Oncology)  Extended Emergency Contact Information Primary Emergency Contact: Black,Emily Address: [address]          [place], [place] [address] [name] of [place] Phone: [phone] Mobile Phone: [phone] Relation: Daughter Secondary Emergency Contact: Marcine Matar [place] of [place] Mobile Phone: [phone] Relation: Daughter  Code Status:  DNR Goals of care: Advanced Directive information Advanced Directives 05/04/2016  Does Patient Have a Medical Advance Directive? Yes  Type of Advance Directive Living will;Out of facility DNR (pink MOST or yellow form);Healthcare Power of Attorney  Does patient want to make changes to medical advance directive? No - Patient declined  Copy of [place] in Chart? Yes  Would patient like information on creating a medical advance directive? -  Pre-existing out of facility DNR order (yellow form or pink MOST form) Yellow form placed in chart (order not valid for inpatient use)     Chief Complaint  Patient presents with  . Medical Management of Chronic Issues    bilateral leg swelling    HPI:  Pt is a 83 y.o. female seen today for medical  management of chronic diseases.      X-ray 04/02/16 L ribs, L spine, T spine showed acute mildly displaced left first to tenth rib fractures. T11 vertebral body 80% reduction in height. Taking Tramadol tid for pain              Hx of constipation, taking MiraLax daily, takes TMP nightly for UTI suppression, she takes Xanax 0.48m tid for anxiety, prn Ambien for sleep.   Past Medical History:  Diagnosis Date  . Abnormal PET scan of mediastinum 11/11/2015  . Abnormalities of the hair 02/29/2012  . Arthritis   . BPV (benign positional vertigo)   . Cancer ([facility]   . Cancer antigen 125 (CA 125) elevation 11/27/2013  . Candidiasis of skin and nails 03/19/2011  . 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 Chorioretinal scar, macular 01/23/2015  . Closed fracture of base of neck of femur ([facility] 03/18/2011  . Compression fracture of thoracic vertebra (HCC) 11/24/2012  . Contact dermatitis and other eczema, due to unspecified cause 05/07/2011  . Depression 07/23/2014  . Diverticulosis of colon (without mention of hemorrhage) 03/19/2011  . Edema 05/07/2011  . HOH (hard of hearing)   . Hypertension 02/24/2016  . Hypopotassemia 03/26/2011  . Hyposmolality and/or hyponatremia 03/19/2011  . Hypotension, unspecified 03/18/2011  . IBS (irritable bowel syndrome) 11/27/2013  . Idiopathic scoliosis 01/27/2016  . Impaired fasting glucose 03/19/2011  . Insomnia, unspecified 03/19/2011  . Kidney infection   . Lumbago 03/19/2011  . Macular degeneration   . Malignant neoplasm of breast (female), unspecified site 10/28/2002  . Mass of right lung 10/23/2015  . Metastasis to adrenal gland ([facility] 11/21/2015   Breast cancer  . Metastatic breast cancer ([facility] [place] 10/27/2004  .  Nonexudative age-related macular degeneration 12/10/2010  . Osteoporosis 08/06/2014  . Osteoporosis, unspecified 03/19/2011  . Other malaise and fatigue 03/19/2011  . Pain in joint, pelvic region and thigh 03/19/2011  . Primary osteoarthritis of  right hip 05/28/2014   TOTAL HIP ARTHROPLASTY ANTERIOR APPROACH HARDWARE REMOVAL on 05/28/2014   . Purpura senilis ([place]) 07/31/2013  . Radiculopathy, cervical 03/23/2016   Right C6  . Rib fractures   . Right bundle branch block 08/30/2011  . Shortness of breath   . Spasm of muscle 03/19/2011  . Spinal stenosis, lumbar region, with neurogenic claudication 11/02/2011  . TMJ click 1/54/0086  . Trigger finger (acquired) 11/02/2011  . Unspecified constipation 03/19/2011  . Urinary frequency 04/24/2013  . UTI (urinary tract infection) 04/16/2013   04/16/13 pseudomonas aeruginosa: tx with Cipro 06/15/14 P. Aeruginosa Fortaz 552m IM q12h x 7 days.     . Vaginitis and vulvovaginitis 02/29/2012  . Vertigo 08/26/2015   Past Surgical History:  Procedure Laterality Date  . BREAST SURGERY Bilateral 2005-10/27/2004   lumpectomy- Streck,MD  . CATARACT EXTRACTION  2010   bialteral  . EYE SURGERY     cataract  . FRACTURE SURGERY Left 09/1980   ankle  . HARDWARE REMOVAL Right 05/28/2014   Procedure: HARDWARE REMOVAL;  Surgeon: PMelrose Nakayama MD;  [facility]  Service: Orthopedics;  Laterality: Right;  . HIP PINNING,CANNULATED  03/08/2011   Procedure: CANNULATED HIP PINNING;  Surgeon: JJohnn Hai MD;  Location: [facility];  Service: Orthopedics;  Laterality: Right;  . ORIF HIP FRACTURE Right 03/08/2011   Bean,MD  . SKIN CANCER EXCISION Left 10/12/2012   lower leg Dr. [place] . SKIN LESION EXCISION Right 02/04/2011   Abdomen lesion spongiotic dermatitis-Taffeen, MD  . SQUAMOUS CELL CARCINOMA EXCISION Right 02/04/2011   forearm-[place] MD  . TONSILLECTOMY    . TOTAL HIP ARTHROPLASTY Right 05/28/2014   Procedure: TOTAL HIP ARTHROPLASTY ANTERIOR APPROACH;  Surgeon: PMelrose Nakayama MD;  Location: [facility]  Service: Orthopedics;  Laterality: Right;    Allergies  Allergen Reactions  . Macrodantin [Nitrofurantoin]     Outpatient Encounter Prescriptions as of 05/04/2016  Medication Sig  .  acetaminophen (TYLENOL) 500 MG tablet Take 1,000 mg by mouth every 6 (six) hours as needed for moderate pain.   .Marland KitchenALPRAZolam (XANAX) 0.5 MG tablet One up to 3 times daily for anxiety  . anastrozole (ARIMIDEX) 1 MG tablet Take 1 tablet (1 mg total) by mouth daily.  .Marland Kitchenantiseptic oral rinse (BIOTENE) LIQD 15 mLs by Mouth Rinse route 2 (two) times daily.  .Marland Kitchenaspirin 81 MG chewable tablet Chew 81 mg by mouth every morning.   . Bilberry 1000 MG CAPS Take 1,000 mg by mouth every morning.   . Calcium Carbonate-Vitamin D (CALCIUM 600/VITAMIN D PO) Take 600 mg by mouth 2 (two) times daily.   . Calcium Polycarbophil (FIBERCON PO) Take 1 tablet by mouth 2 (two) times daily.   . cholecalciferol (VITAMIN D) 400 UNITS TABS Take 400 Units by mouth every morning.   .Marland KitchenCRANBERRY EXTRACT PO Take 1 tablet by mouth every morning.   . fish oil-omega-3 fatty acids 1000 MG capsule Take 1 g by mouth every morning.   . Glucosamine-Chondroitin (GLUCOSAMINE CHONDR COMPLEX PO) Take 1 tablet by mouth 2 (two) times daily. for joints  . guaiFENesin (MUCINEX) 600 MG 12 hr tablet Take 600 mg by mouth 2 (two) times daily.  .Marland Kitchenibuprofen (ADVIL,MOTRIN) 200 MG tablet Take 200 mg by mouth. Take 2  tablets as needed for inflammatory pain  . meclizine (ANTIVERT) 25 MG tablet Take 1 tablet (25 mg total) by mouth 3 (three) times daily as needed for dizziness.  . Multiple Vitamins-Minerals (PRESERVISION/LUTEIN) CAPS Take 1 capsule by mouth 2 (two) times daily.  Marland Kitchen nystatin (MYCOSTATIN/NYSTOP) powder Apply to affected skin daily to treat yeast  . omeprazole (PRILOSEC OTC) 20 MG tablet Take 20 mg by mouth daily.  . ondansetron (ZOFRAN) 4 MG tablet Take 1 tablet (4 mg total) by mouth every 8 (eight) hours as needed for nausea or vomiting.  . polyethylene glycol (MIRALAX / GLYCOLAX) packet Take 17 g by mouth daily.  . Probiotic Product (CULTURELLE PRO-WELL PO) Take by mouth. Take one probiotic tablet in morning  . traMADol (ULTRAM) 50 MG tablet  Take 0.5-1 tablets (25-50 mg total) by mouth every 6 (six) hours as needed.  . trimethoprim (TRIMPEX) 100 MG tablet Take 100 mg by mouth at bedtime.  Marland Kitchen zolpidem (AMBIEN) 5 MG tablet Take one half tablet at bedtime if needed for rest   No facility-administered encounter medications on file as of 05/04/2016.     Review of Systems  Constitutional: Positive for activity change, appetite change and fatigue. Negative for fever and unexpected weight change.  HENT: Positive for hearing loss (bilateral earing aides). Negative for ear pain.   Eyes:       History of loss of visual acuity and macular degeneration.  Respiratory: Negative for cough and shortness of breath.        History of breast discomfort. History of removal of a lump of cancer from the right breast. Dyspnea on exertion.  Cardiovascular: Positive for leg swelling. Negative for chest pain and palpitations.  Gastrointestinal: Positive for nausea.       She frequently complains that her intestines are not working right. There is a previous history of diverticulosis. Her last colonoscopy was in 2008. She has been seen by Dr. Paulita Fujita in the past. Constipation - she associates with Tramadol  Genitourinary: Positive for decreased urine volume, difficulty urinating and frequency. Negative for dysuria, vaginal bleeding and vaginal discharge.       Complains of a slow, weak urinary stream. Denies dysuria. Has increased frequency and nocturia. Saw Dr. Louis Meckel, urologist. She was taught self-catheterization for a bladder that does not empty well. Vaginal discomfort. Managed by Dr. Claudean Kinds. History of elevated CEA 125. Normal ultrasound of the abdomen and genitourinary tract. Recurrent UTI.  Musculoskeletal: Positive for back pain.       Chronic back pain.  Patient has a shorter right leg and has a lift in the right shoe. Now seeing Dr. Rhona Raider.  Right hip arthroplasty 5/16 There is generalized stiffness in the joints. There is a history of  spinal stenosis. Left third finger catches on extension sometimes. There is a trigger finger. Pain at the left iliac crest and left anterior ribs near the sternum. Left rib cage mild pain with deep breath and truncal movement.   Skin:       History of skin discoloration and changes of nails.  [place] in late Oct 2017 and contused the left side of the back Left side bruise.  Allergic/Immunologic: Negative.   Neurological: Positive for dizziness. Negative for tremors, seizures and numbness.       History of spinal stenosis with neurogenic claudication affecting the right leg. Complains of pain in the buttocks that is suggestive of neuralgia from her spinal stenosis. Hospitalized for 24 hours 08/24/2015 for vertigo.  Hematological: Bruises/bleeds easily.  Psychiatric/Behavioral: Positive for dysphoric mood.    Immunization History  Administered Date(s) Administered  . Influenza Whole 10/12/2011, 10/12/2012  . Influenza-Unspecified 10/25/2013, 10/10/2014, 12/12/2015  . PPD Test 07/07/2010  . Pneumococcal Conjugate-13 01/12/2003  . Zoster 01/12/2007   Pertinent  Health Maintenance Due  Topic Date Due  . PNA vac Low Risk Adult (2 of 2 - PPSV23) 01/12/2004  . INFLUENZA VACCINE  08/11/2016  . MAMMOGRAM  08/11/2016  . DEXA SCAN  Completed   Fall Risk  11/11/2015 10/16/2015 07/03/2015 01/28/2015 10/22/2014  Falls in the past year? Yes No No No No  Number falls in past yr: 1 - - - -  Injury with Fall? Yes - - - -  Risk Factor Category  High Fall Risk - - - -  Risk for fall due to : History of fall(s);Impaired balance/gait;Impaired mobility - - - -  Follow up Falls evaluation completed - - - -   Functional Status Survey:    Vitals:   05/04/16 0906  BP: (!) 104/53  Pulse: 95  Resp: 20  Temp: 97.1 F (36.2 C)  SpO2: 96%  Weight: 131 lb (59.4 kg)  Height: 5' 1"  (1.549 m)   Body mass index is 24.75 kg/m. Physical Exam  Constitutional: She is oriented to person, place, and time. She  appears well-developed and well-nourished. No distress.  HENT:  HOH Nonocclusive wax in both EAC.  Eyes:  Corrective lenses.  Neck: No JVD present. No tracheal deviation present. No thyromegaly present.  Cardiovascular: Normal rate, regular rhythm and normal heart sounds.  Exam reveals no gallop and no friction rub.   No murmur heard. Pulses in RLE not palpable, has 2+ edema which could be obscuring pulses  Pulmonary/Chest: Effort normal and breath sounds normal. She has no rales.  Abdominal: Soft. Bowel sounds are normal. She exhibits no distension and no mass. There is no tenderness.  Musculoskeletal: Normal range of motion. She exhibits edema (RLE) and tenderness (T12-L1 area).  Trace edema BLE.   Lymphadenopathy:    She has no cervical adenopathy.  Neurological: She is alert and oriented to person, place, and time. No cranial nerve deficit.  Skin: Skin is warm and dry. There is pallor.  Left side of the rib cage and flank bruised, denied trauma  Psychiatric: She has a normal mood and affect. Her behavior is normal. Thought content normal.    Labs reviewed:  Recent Labs  12/03/15 0716 01/19/16 0800  02/18/16 1204 03/11/16 1307 03/22/16 1445 04/05/16 04/08/16 04/26/16 1412  NA 133* 134*  < > 129* 126* 133* 131* 133* 135*  K 4.2 4.5  < > 4.2 4.8 4.7 4.5 4.7 4.5  CL 101 98  --  98*  --   --   --   --   --   CO2 24 28  < > 23 24 26   --   --  27  GLUCOSE 118* 107*  < > 108* 119 105  --   --  125  BUN 19 15  < > 13 10.1 11.3 10 10  11.3  CREATININE 0.80 0.81  < > 0.71 0.7 0.8 0.7 0.7 0.9  CALCIUM 9.3 9.5  < > 8.9 8.7 8.3*  --   --  7.7*  < > = values in this interval not displayed.  Recent Labs  03/11/16 1307 03/22/16 1445 04/05/16 04/08/16 04/26/16 1412  AST 32 49* 37* 46* 36*  ALT 22 45 30 35 25  ALKPHOS 420* 358* 333* 332*  264*  BILITOT 0.34 0.29  --   --  0.37  PROT 6.1* 5.7*  --   --  6.2*  ALBUMIN 3.4* 3.1*  --   --  3.4*    Recent Labs  03/11/16 1307  03/22/16 1445 04/05/16 04/08/16 04/26/16 1411  WBC 6.2 6.7 7.3 8.0 6.5  NEUTROABS 3.7 3.5  --   --  3.3  HGB 10.9* 10.2* 10.2* 10.4* 11.3*  HCT 32.5* 30.9* 32* 32* 34.6*  MCV 93.7 93.6  --   --  92.4  PLT 210 204 302 298 230   Lab Results  Component Value Date   TSH 3.215 08/25/2015   Lab Results  Component Value Date   HGBA1C 5.9 01/23/2015   No results found for: CHOL, HDL, LDLCALC, LDLDIRECT, TRIG, CHOLHDL  Significant Diagnostic Results in last 30 days:  No results found.  Assessment/Plan Edema 04/08/16 Na 133, K 4.7, Bun 10, creat 0.71, wbc 8.0, Hgb 10.4, plt 298, alk phos 332Trace edema BLE, chronic, monitor weight daily ac breakfast, then re-evaluate, monitor for s/s of developing CHF  Multiple rib fractures X-ray 04/02/16 L ribs, L spine, T spine showed acute mildly displaced left first to tenth rib fractures. T11 vertebral body 80% reduction in height.  Pain is managed with Tramadol.   Hypertension Normalized, no med  Constipation Stable, continue MiraLax daily.   Depression Her mood is managed with Alprazolam tid.   Insomnia Takes Zolpidem 2m prn at night for sleep  Anemia, chronic disease Hgb 10.4 04/08/16  GERD (gastroesophageal reflux disease) c/o nauseated all the time, c/o poor appetite as well,  Zofran prn helps, but she doesn't always request it, will schedule Zofran [id number], , adding Mirtazapine 7.61m2mhs, continue Omeprazole 67m80mily.   [facility], continue Arimidex.      Family/ staff Communication: daily weight ac breakfast   Labs/tests ordered:  none

## 2016-05-07 ENCOUNTER — Encounter: Payer: Self-pay | Admitting: *Deleted

## 2016-05-07 NOTE — Progress Notes (Signed)
[place] Work  Clinical Social Work was referred by daughter for assessment of psychosocial needs.  Clinical Social Worker contacted daughter of patient via phone to offer support and assess for needs.  CSW explained role of CSW/Pt and Family Support Team, support groups and other resources to assist. Daughter shared pt has not felt well physically, which has impacted her emotional state. CSW and daughter explored possible options for additional support. CSW to mail packet with Support Services info and have chaplain send supportive note directly to pt. CSW has Proofreader exploring additional support option through Nutritional therapist. Daughter to check with SW at [facility] and [facility] CSW to follow and reassess as needed. Family may be interested in referral to [facility] for additional support at ALF.    Clinical Social Work interventions: Resource education and referral Supportive listening  Loren Racer, LCSW, OSW-C Clinical Social Worker [place]  [place] Phone: [phone] Fax: [phone]

## 2016-05-10 ENCOUNTER — Other Ambulatory Visit: Payer: Self-pay | Admitting: Oncology

## 2016-05-10 ENCOUNTER — Encounter (HOSPITAL_COMMUNITY)
Admission: RE | Admit: 2016-05-10 | Discharge: 2016-05-10 | Disposition: A | Discharge status: Home / Self Care | Payer: Medicare Other | Source: Ambulatory Visit | Attending: Oncology | Admitting: Oncology

## 2016-05-10 DIAGNOSIS — C7972 Secondary malignant neoplasm of left adrenal gland: Secondary | ICD-10-CM

## 2016-05-10 DIAGNOSIS — Z170 Estrogen receptor positive status [ER+]: Secondary | ICD-10-CM | POA: Insufficient documentation

## 2016-05-10 DIAGNOSIS — C50911 Malignant neoplasm of unspecified site of right female breast: Secondary | ICD-10-CM | POA: Diagnosis not present

## 2016-05-10 DIAGNOSIS — C801 Malignant (primary) neoplasm, unspecified: Secondary | ICD-10-CM | POA: Insufficient documentation

## 2016-05-10 LAB — GLUCOSE, CAPILLARY: Glucose-Capillary: 104 mg/dL — ABNORMAL HIGH (ref 65–99)

## 2016-05-10 MED ORDER — FLUDEOXYGLUCOSE F - 18 (FDG) INJECTION
8.2900 | Freq: Once | INTRAVENOUS | Status: AC | PRN
  Administered 2016-05-10: 8.29 via INTRAVENOUS

## 2016-05-11 ENCOUNTER — Non-Acute Institutional Stay: Payer: Medicare Other | Admitting: Internal Medicine

## 2016-05-11 ENCOUNTER — Encounter: Payer: Self-pay | Admitting: Internal Medicine

## 2016-05-11 VITALS — BP 122/58 | HR 80 | Temp 97.6°F | Ht 61.0 in | Wt 132.0 lb

## 2016-05-11 DIAGNOSIS — R110 Nausea: Secondary | ICD-10-CM | POA: Insufficient documentation

## 2016-05-11 DIAGNOSIS — D638 Anemia in other chronic diseases classified elsewhere: Secondary | ICD-10-CM | POA: Diagnosis not present

## 2016-05-11 DIAGNOSIS — R05 Cough: Secondary | ICD-10-CM | POA: Diagnosis not present

## 2016-05-11 DIAGNOSIS — N390 Urinary tract infection, site not specified: Secondary | ICD-10-CM

## 2016-05-11 DIAGNOSIS — R609 Edema, unspecified: Secondary | ICD-10-CM

## 2016-05-11 DIAGNOSIS — R918 Other nonspecific abnormal finding of lung field: Secondary | ICD-10-CM

## 2016-05-11 DIAGNOSIS — C7972 Secondary malignant neoplasm of left adrenal gland: Secondary | ICD-10-CM

## 2016-05-11 DIAGNOSIS — I10 Essential (primary) hypertension: Secondary | ICD-10-CM

## 2016-05-11 DIAGNOSIS — R739 Hyperglycemia, unspecified: Secondary | ICD-10-CM | POA: Diagnosis not present

## 2016-05-11 DIAGNOSIS — R059 Cough, unspecified: Secondary | ICD-10-CM

## 2016-05-11 DIAGNOSIS — E871 Hypo-osmolality and hyponatremia: Secondary | ICD-10-CM

## 2016-05-11 DIAGNOSIS — G4700 Insomnia, unspecified: Secondary | ICD-10-CM | POA: Diagnosis not present

## 2016-05-11 MED ORDER — URIBEL 118 MG PO CAPS: ORAL_CAPSULE | ORAL | 5 refills | Status: DC

## 2016-05-11 NOTE — Progress Notes (Signed)
[facility] Room Number: AL08  Place of Service: Clinic (12)     Allergies  Allergen Reactions  . Macrodantin [Nitrofurantoin]     Chief Complaint  Patient presents with  . Medical Management of Chronic Issues    4 month, blood pressure, IBS, right lung mass, edema.     HPI:  Mass of right lung - under treatment with Xgeva  Insomnia, unspecified type - better. Sleeping well on both Ambien and Remereon. She may not need to be on both.  Malignant neoplasm metastatic to left adrenal gland (HCC) - may contribute to the hyponatremia  Anemia, chronic disease - improved with last hgb 11.5  Cough - improved  Hyperglycemia - unchanged . Had recent values at 150 and 109.  Hypertension, unspecified type - controlled  Hyponatremia - continues on Gatorade  Urinary tract infection without hematuria, site unspecified - complains of dysuria. Wants to resume Uribel.  Nausea - improved since on Zofran  Recent lab shows persistent elevations in the Alk phos and AST and ALT    Medications: Patient's Medications  New Prescriptions   No medications on file  Previous Medications   ACETAMINOPHEN (TYLENOL) 500 MG TABLET    Take 1,000 mg by mouth every 6 (six) hours as needed for moderate pain.    ALPRAZOLAM (XANAX) 0.5 MG TABLET    Take 0.5 mg by mouth. Take one tablet daily in the morning. Take one as needed twice daily   ANASTROZOLE (ARIMIDEX) 1 MG TABLET    Take 1 tablet (1 mg total) by mouth daily.   ANTISEPTIC ORAL RINSE (BIOTENE) LIQD    15 mLs by Mouth Rinse route 2 (two) times daily.   ASPIRIN 81 MG CHEWABLE TABLET    Chew 81 mg by mouth every morning.    BILBERRY 1000 MG CAPS    Take 150 mg by mouth every morning.    CALCIUM CARBONATE-VITAMIN D (CALCIUM 600/VITAMIN D PO)    Take 600 mg by mouth 2 (two) times daily.    CALCIUM POLYCARBOPHIL (FIBERCON PO)    Take 1 tablet by mouth 2 (two) times daily.    CHOLECALCIFEROL (VITAMIN D) 400 UNITS TABS    Take 400  Units by mouth every morning.    CRANBERRY EXTRACT PO    Take 1 tablet by mouth every morning.    FISH OIL-OMEGA-3 FATTY ACIDS 1000 MG CAPSULE    Take 1 g by mouth every morning.    FUROSEMIDE (LASIX) 40 MG TABLET    Take 40 mg by mouth. Take one daily   GLUCOSAMINE-CHONDROITIN (GLUCOSAMINE CHONDR COMPLEX PO)    Take 1 tablet by mouth 2 (two) times daily. for joints   GUAIFENESIN (MUCINEX) 600 MG 12 HR TABLET    Take 600 mg by mouth 2 (two) times daily.   IBUPROFEN (ADVIL,MOTRIN) 200 MG TABLET    Take 200 mg by mouth. Take 2 tablets as needed for inflammatory pain   MECLIZINE (ANTIVERT) 25 MG TABLET    Take 1 tablet (25 mg total) by mouth 3 (three) times daily as needed for dizziness.   MIRTAZAPINE (REMERON) 7.5 MG TABLET    Take 7.5 mg by mouth at bedtime.   MULTIPLE VITAMINS-MINERALS (PRESERVISION/LUTEIN) CAPS    Take 1 capsule by mouth 2 (two) times daily.   NYSTATIN (MYCOSTATIN/NYSTOP) POWDER    Apply to affected skin daily to treat yeast   OMEPRAZOLE (PRILOSEC OTC) 20 MG TABLET    Take 20 mg by  mouth daily.   ONDANSETRON (ZOFRAN) 4 MG TABLET    Take 1 tablet (4 mg total) by mouth every 8 (eight) hours as needed for nausea or vomiting.   POLYETHYLENE GLYCOL (MIRALAX / GLYCOLAX) PACKET    Take 17 g by mouth daily.   TRAMADOL (ULTRAM) 50 MG TABLET    Take 50 mg by mouth. Take one tablet four times daily with meals and at bedtime   TRIMETHOPRIM (TRIMPEX) 100 MG TABLET    Take 100 mg by mouth at bedtime.   ZOLPIDEM (AMBIEN) 5 MG TABLET    Take one half tablet at bedtime if needed for rest  Modified Medications   No medications on file  Discontinued Medications   ALPRAZOLAM (XANAX) 0.5 MG TABLET    One up to 3 times daily for anxiety   PROBIOTIC PRODUCT (CULTURELLE PRO-WELL PO)    Take by mouth. Take one probiotic tablet in morning   TRAMADOL (ULTRAM) 50 MG TABLET    Take 0.5-1 tablets (25-50 mg total) by mouth every 6 (six) hours as needed.     Review of Systems  Constitutional: Positive  for activity change, appetite change and fatigue. Negative for fever and unexpected weight change.  HENT: Positive for hearing loss (bilateral earing aides). Negative for ear pain.   Eyes:       History of loss of visual acuity and macular degeneration.  Respiratory: Negative for cough and shortness of breath.        History of breast discomfort. History of removal of a lump of cancer from the right breast. Dyspnea on exertion.  Cardiovascular: Positive for leg swelling (tight, shiny skin). Negative for chest pain and palpitations.  Gastrointestinal: Positive for nausea (improved on Zofran prior to each meal).       She frequently complains that her intestines are not working right. There is a previous history of diverticulosis. Her last colonoscopy was in 2008. She has been seen by Dr. Paulita Fujita in the past. Constipation - she associates with Tramadol  Genitourinary: Positive for decreased urine volume, difficulty urinating, dysuria and frequency. Negative for vaginal bleeding and vaginal discharge.       Complains of a slow, weak urinary stream. Denies dysuria. Has increased frequency and nocturia. Saw Dr. Louis Meckel, urologist. She was taught self-catheterization for a bladder that does not empty well. Vaginal discomfort. Managed by Dr. Claudean Kinds. History of elevated CEA 125. Normal ultrasound of the abdomen and genitourinary tract. Recurrent UTI.  Musculoskeletal: Positive for back pain and gait problem (unstable. using walker.).       Chronic back pain.  Patient has a shorter right leg and has a lift in the right shoe. Now seeing Dr. Rhona Raider.  Right hip arthroplasty 5/16 There is generalized stiffness in the joints. There is a history of spinal stenosis. Left third finger catches on extension sometimes. There is a trigger finger. Pain at the left iliac crest and left anterior ribs near the sternum. Left rib cage mild pain with deep breath and truncal movement.   Skin:       History of skin  discoloration and changes of nails.  [place] in late Oct 2017 and contused the left side of the back Left side bruise.  Allergic/Immunologic: Negative.   Neurological: Positive for dizziness. Negative for tremors, seizures and numbness.       History of spinal stenosis with neurogenic claudication affecting the right leg. Complains of pain in the buttocks that is suggestive of neuralgia from her spinal stenosis. Hospitalized  for 24 hours 08/24/2015 for vertigo.  Hematological: Bruises/bleeds easily.  Psychiatric/Behavioral: Positive for dysphoric mood.    Vitals:   05/11/16 0919  BP: (!) 122/58  Pulse: 80  Temp: 97.6 F (36.4 C)  TempSrc: Oral  SpO2: 95%  Weight: 132 lb (59.9 kg)  Height: _0  (1.549 m)   Wt Readings from Last 3 Encounters:  05/11/16 132 lb (59.9 kg)  05/04/16 131 lb (59.4 kg)  04/26/16 130 lb 4.8 oz (59.1 kg)    Body mass index is 24.94 kg/m.  Physical Exam  Constitutional: She is oriented to person, place, and time. She appears well-developed and well-nourished. No distress.  HENT:  HOH Nonocclusive wax in both EAC.  Eyes:  Corrective lenses.  Neck: No JVD present. No tracheal deviation present. No thyromegaly present.  Cardiovascular: Normal rate, regular rhythm and normal heart sounds.  Exam reveals no gallop and no friction rub.   No murmur heard. Pulses in RLE not palpable, has 2+ edema which could be obscuring pulses  Pulmonary/Chest: Effort normal and breath sounds normal. She has no rales.  Abdominal: Soft. Bowel sounds are normal. She exhibits no distension and no mass. There is no tenderness.  Musculoskeletal: Normal range of motion. She exhibits edema (both lower legs and has tight, shiny skin. Painless) and tenderness (T12-L1 area).  Trace edema BLE.   Lymphadenopathy:    She has no cervical adenopathy.  Neurological: She is alert and oriented to person, place, and time. No cranial nerve deficit.  Mild memory loss  Skin: Skin is warm and  dry. There is pallor.  Psychiatric: Her behavior is normal. Thought content normal.     Labs reviewed: Lab Summary Latest Ref Rng & Units 04/26/2016 04/08/2016 04/05/2016  Hemoglobin 11.6 - 15.9 g/dL 11.3(L) 10.4(A) 10.2(A)  Hematocrit 34.8 - 46.6 % 34.6(L) 32(A) 32(A)  White count 3.9 - 10.3 10e3/uL 6.5 8.0 7.3  Platelet count 145 - 400 10e3/uL 230 298 302  Sodium 136 - 145 mEq/L 135(L) 133(A) 131(A)  Potassium 3.5 - 5.1 mEq/L 4.5 4.7 4.5  Calcium 8.4 - 10.4 mg/dL 7.7(L) (None) (None)  Phosphorus - (None) (None) (None)  Creatinine 0.6 - 1.1 mg/dL 0.9 0.7 0.7  AST 5 - 34 U/L 36(H) 46(A) 37(A)  Alk Phos 40 - 150 U/L 264(H) 332(A) 333(A)  Bilirubin 0.20 - 1.20 mg/dL 0.37 (None) (None)  Glucose 70 - 140 mg/dl 125 109 150  Cholesterol - (None) (None) (None)  HDL cholesterol - (None) (None) (None)  Triglycerides - (None) (None) (None)  LDL Direct - (None) (None) (None)  LDL Calc - (None) (None) (None)  Total protein 6.4 - 8.3 g/dL 6.2(L) (None) (None)  Albumin 3.5 - 5.0 g/dL 3.4(L) (None) (None)  Some recent data might be hidden   Lab Results  Component Value Date   TSH 3.215 08/25/2015   Lab Results  Component Value Date   BUN 11.3 04/26/2016   BUN 10 04/08/2016   BUN 10 04/05/2016   Lab Results  Component Value Date   CREATININE 0.9 04/26/2016   CREATININE 0.7 04/08/2016   CREATININE 0.7 04/05/2016   Lab Results  Component Value Date   HGBA1C 5.9 01/23/2015       Assessment/Plan  1. Insomnia, unspecified type Make zolpidem prn. Continue Remeron each night.  2. Mass of right lung Continue to see oncology  3. Malignant neoplasm metastatic to left adrenal gland (HCC) Continue to see oncology  4. Anemia, chronic disease improved  5. Cough inmproved  6. Hyperglycemia unchanged  7. Hypertension, unspecified type controlled  8. Hyponatremia improved  9. Urinary tract infection without hematuria, site unspecified -UA and culture -Uribel tid prn  dysuria  10. Nausea Improved on Zofran  11. Edema Currently on furosemide 40 mg qd. We discussed use of compression hose or increase diuretic. She does not want to do either thing at this time. The edema is a "cosmetic" issue that she does not feel uncomfortable with.

## 2016-05-12 DIAGNOSIS — N390 Urinary tract infection, site not specified: Secondary | ICD-10-CM | POA: Diagnosis not present

## 2016-05-17 ENCOUNTER — Other Ambulatory Visit: Payer: Medicare Other

## 2016-05-18 ENCOUNTER — Encounter: Payer: Self-pay | Admitting: Internal Medicine

## 2016-05-18 ENCOUNTER — Encounter: Payer: Self-pay | Admitting: Nurse Practitioner

## 2016-05-18 ENCOUNTER — Non-Acute Institutional Stay: Payer: Medicare Other | Admitting: Nurse Practitioner

## 2016-05-18 DIAGNOSIS — D638 Anemia in other chronic diseases classified elsewhere: Secondary | ICD-10-CM

## 2016-05-18 DIAGNOSIS — I10 Essential (primary) hypertension: Secondary | ICD-10-CM

## 2016-05-18 DIAGNOSIS — M1611 Unilateral primary osteoarthritis, right hip: Secondary | ICD-10-CM | POA: Diagnosis not present

## 2016-05-18 DIAGNOSIS — F3341 Major depressive disorder, recurrent, in partial remission: Secondary | ICD-10-CM

## 2016-05-18 DIAGNOSIS — R609 Edema, unspecified: Secondary | ICD-10-CM

## 2016-05-18 DIAGNOSIS — E871 Hypo-osmolality and hyponatremia: Secondary | ICD-10-CM

## 2016-05-18 DIAGNOSIS — C50919 Malignant neoplasm of unspecified site of unspecified female breast: Secondary | ICD-10-CM

## 2016-05-18 DIAGNOSIS — K5900 Constipation, unspecified: Secondary | ICD-10-CM

## 2016-05-18 DIAGNOSIS — N390 Urinary tract infection, site not specified: Secondary | ICD-10-CM

## 2016-05-18 DIAGNOSIS — G4700 Insomnia, unspecified: Secondary | ICD-10-CM

## 2016-05-18 DIAGNOSIS — K219 Gastro-esophageal reflux disease without esophagitis: Secondary | ICD-10-CM | POA: Diagnosis not present

## 2016-05-18 DIAGNOSIS — R339 Retention of urine, unspecified: Secondary | ICD-10-CM

## 2016-05-18 NOTE — Assessment & Plan Note (Signed)
Prn I+O cath

## 2016-05-18 NOTE — Assessment & Plan Note (Signed)
Last Na 133 04/08/16

## 2016-05-18 NOTE — Assessment & Plan Note (Signed)
Stable, continue MiraLax daily.  

## 2016-05-18 NOTE — Assessment & Plan Note (Signed)
trace edema BLE, chronic, monitor weight, continue Furosemide 40mg  daily

## 2016-05-18 NOTE — Assessment & Plan Note (Addendum)
Continue supportive care, continue Arimidex.

## 2016-05-18 NOTE — Assessment & Plan Note (Signed)
Normalized, continue Furosemide 40mg  daily

## 2016-05-18 NOTE — Assessment & Plan Note (Signed)
Better, continue  Zofran 4mg  qam, , Mirtazapine 7.5mg  qhs, Omeprazole 20mg  daily.

## 2016-05-18 NOTE — Assessment & Plan Note (Signed)
Last Hgb 10.4 04/08/16

## 2016-05-18 NOTE — Assessment & Plan Note (Signed)
X-ray 04/02/16 L ribs, L spine, T spine showed acute mildly displaced left first to tenth rib fractures. T11 vertebral body 80% reduction in height.  Pain is managed with Tramadol qid, prn Ibuprofen, prn Tylenol

## 2016-05-18 NOTE — Progress Notes (Signed)
Location:  [facility] Room Number: [id number] Place of Service:  ALF [phone]) Provider:  Mast, Manxie  NP  Estill Dooms, MD  Patient Care Team: Estill Dooms, MD as PCP - General (Internal Medicine) Melina Modena, [facility] Mast, Man X, NP as Nurse Practitioner (Nurse Practitioner) Shon Hough, MD as Consulting Physician (Ophthalmology) Molli Posey, MD as Consulting Physician (Obstetrics and Gynecology) Gerarda Fraction, MD as Consulting Physician (Ophthalmology) Lorelle Gibbs, MD as Consulting Physician (Radiology) Melrose Nakayama, MD as Consulting Physician (Orthopedic Surgery) Gerarda Fraction, MD as Referring Physician (Ophthalmology) Ardis Hughs, MD as Attending Physician (Urology) Magrinat, Virgie Dad, MD as Consulting Physician (Oncology)  Extended Emergency Contact Information Primary Emergency Contact: Black,Emily Address: [address]          [place], [place] [address] [name] of [place] Phone: [phone] Mobile Phone: [phone] Relation: Daughter Secondary Emergency Contact: Marcine Matar [place] of [place] Mobile Phone: [phone] Relation: Daughter  Code Status:  DNR Goals of care: Advanced Directive information Advanced Directives 05/18/2016  Does Patient Have a Medical Advance Directive? Yes  Type of Paramedic of [place];Living will;Out of facility DNR (pink MOST or yellow form)  Does patient want to make changes to medical advance directive? No - Patient declined  Copy of [place] in Chart? Yes  Would patient like information on creating a medical advance directive? -  Pre-existing out of facility DNR order (yellow form or pink MOST form) Yellow form placed in chart (order not valid for inpatient use)     Chief Complaint  Patient presents with  . Acute Visit    UTI    HPI:  Pt is a 83 y.o. female seen today for an acute visit for urine culture 05/15/16  showed Coag neg Staph >100,000c/ml, 5 day course of Gentamicin 60mg  bid IM started and tolerated.     X-ray 04/02/16 L ribs, L spine, T spine showed acute mildly displaced left first to tenth rib fractures. T11 vertebral body 80% reduction in height. Taking Tramadol tid for pain  Hx of constipation, taking MiraLax daily, takes TMP nightly for UTI suppression, she takes Xanax 0.5mg  tid for anxiety, prn Ambien for sleep.   Past Medical History:  Diagnosis Date  . Abnormal PET scan of mediastinum 11/11/2015  . Abnormalities of the hair 02/29/2012  . Arthritis   . BPV (benign positional vertigo)   . Cancer ([facility])   . Cancer antigen 125 (CA 125) elevation 11/27/2013  . Candidiasis of skin and nails 03/19/2011  . 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 Chorioretinal scar, macular 01/23/2015  . Closed fracture of base of neck of femur ([facility]) 03/18/2011  . Compression fracture of thoracic vertebra ([facility]) 11/24/2012  . Contact dermatitis and other eczema, due to unspecified cause 05/07/2011  . Depression 07/23/2014  . Diverticulosis of colon (without mention of hemorrhage) 03/19/2011  . Edema 05/07/2011  . HOH (hard of hearing)   . Hypertension 02/24/2016  . Hypopotassemia 03/26/2011  . Hyposmolality and/or hyponatremia 03/19/2011  . Hypotension, unspecified 03/18/2011  . IBS (irritable bowel syndrome) 11/27/2013  . Idiopathic scoliosis 01/27/2016  . Impaired fasting glucose 03/19/2011  . Insomnia, unspecified 03/19/2011  . Kidney infection   . Lumbago 03/19/2011  . Macular degeneration   . Malignant neoplasm of breast (female), unspecified site 10/28/2002  . Mass of right lung 10/23/2015  . Metastasis to adrenal gland ([facility]) 11/21/2015   Breast cancer  . Metastatic breast cancer ([facility]) 10/27/2004  .  Nonexudative age-related macular degeneration 12/10/2010  . Osteoporosis 08/06/2014  . Osteoporosis, unspecified 03/19/2011  . Other malaise and fatigue 03/19/2011  . Pain in joint,  pelvic region and thigh 03/19/2011  . Primary osteoarthritis of right hip 05/28/2014   TOTAL HIP ARTHROPLASTY ANTERIOR APPROACH HARDWARE REMOVAL on 05/28/2014   . Purpura senilis ([facility]) 07/31/2013  . Radiculopathy, cervical 03/23/2016   Right C6  . Rib fractures   . Right bundle branch block 08/30/2011  . Shortness of breath   . Spasm of muscle 03/19/2011  . Spinal stenosis, lumbar region, with neurogenic claudication 11/02/2011  . TMJ click 04/20/7351  . Trigger finger (acquired) 11/02/2011  . Unspecified constipation 03/19/2011  . Urinary frequency 04/24/2013  . UTI (urinary tract infection) 04/16/2013   04/16/13 pseudomonas aeruginosa: tx with Cipro 06/15/14 P. Aeruginosa [name] 500mg  IM q12h x 7 days.     . Vaginitis and vulvovaginitis 02/29/2012  . Vertigo 08/26/2015   Past Surgical History:  Procedure Laterality Date  . BREAST SURGERY Bilateral 2005-10/27/2004   lumpectomy- Streck,MD  . CATARACT EXTRACTION  2010   bialteral  . EYE SURGERY     cataract  . FRACTURE SURGERY Left 09/1980   ankle  . HARDWARE REMOVAL Right 05/28/2014   Procedure: HARDWARE REMOVAL;  Surgeon: Melrose Nakayama, MD;  Location: [facility];  Service: Orthopedics;  Laterality: Right;  . HIP PINNING,CANNULATED  03/08/2011   Procedure: CANNULATED HIP PINNING;  Surgeon: Johnn Hai, MD;  Location: [facility];  Service: Orthopedics;  Laterality: Right;  . ORIF HIP FRACTURE Right 03/08/2011   Bean,MD  . SKIN CANCER EXCISION Left 10/12/2012   lower leg Dr. [place]  . SKIN LESION EXCISION Right 02/04/2011   Abdomen lesion spongiotic dermatitis-Taffeen, MD  . SQUAMOUS CELL CARCINOMA EXCISION Right 02/04/2011   forearmSyble Creek, MD  . TONSILLECTOMY    . TOTAL HIP ARTHROPLASTY Right 05/28/2014   Procedure: TOTAL HIP ARTHROPLASTY ANTERIOR APPROACH;  Surgeon: Melrose Nakayama, MD;  Location: [facility];  Service: Orthopedics;  Laterality: Right;    Allergies  Allergen Reactions  . Macrodantin [Nitrofurantoin]     Outpatient  Encounter Prescriptions as of 05/18/2016  Medication Sig  . acetaminophen (TYLENOL) 500 MG tablet Take 1,000 mg by mouth every 6 (six) hours as needed for moderate pain.   Marland Kitchen ALPRAZolam (XANAX) 0.5 MG tablet Take 0.5 mg by mouth. Take one tablet daily in the morning. Take one as needed twice daily  . anastrozole (ARIMIDEX) 1 MG tablet Take 1 tablet (1 mg total) by mouth daily.  Marland Kitchen antiseptic oral rinse (BIOTENE) LIQD 15 mLs by Mouth Rinse route 2 (two) times daily.  Marland Kitchen aspirin 81 MG chewable tablet Chew 81 mg by mouth every morning.   . Bilberry 1000 MG CAPS Take 150 mg by mouth every morning.   . Calcium Carbonate-Vitamin D (CALCIUM 600/VITAMIN D PO) Take 600 mg by mouth 2 (two) times daily.   . Calcium Polycarbophil (FIBERCON PO) Take 1 tablet by mouth 2 (two) times daily.   . cholecalciferol (VITAMIN D) 400 UNITS TABS Take 400 Units by mouth every morning.   Marland Kitchen CRANBERRY EXTRACT PO Take 1 tablet by mouth every morning.   . fish oil-omega-3 fatty acids 1000 MG capsule Take 1 g by mouth every morning.   . furosemide (LASIX) 40 MG tablet Take 40 mg by mouth. Take one daily  . Glucosamine-Chondroitin (GLUCOSAMINE CHONDR COMPLEX PO) Take 1 tablet by mouth 2 (two) times daily. for joints  . guaiFENesin (MUCINEX) 600 MG 12  hr tablet Take 600 mg by mouth 2 (two) times daily.  Marland Kitchen ibuprofen (ADVIL,MOTRIN) 200 MG tablet Take 200 mg by mouth. Take 2 tablets as needed for inflammatory pain  . meclizine (ANTIVERT) 25 MG tablet Take 1 tablet (25 mg total) by mouth 3 (three) times daily as needed for dizziness.  . Meth-Hyo-M Bl-Na Phos-Ph Sal (URIBEL) 118 MG CAPS One up to 3 times daily as needed for dysuria  . mirtazapine (REMERON) 7.5 MG tablet Take 7.5 mg by mouth at bedtime.  . Multiple Vitamins-Minerals (PRESERVISION/LUTEIN) CAPS Take 1 capsule by mouth 2 (two) times daily.  Marland Kitchen nystatin (MYCOSTATIN/NYSTOP) powder Apply to affected skin daily to treat yeast  . omeprazole (PRILOSEC OTC) 20 MG tablet Take 20 mg  by mouth daily.  . ondansetron (ZOFRAN) 4 MG tablet Take 1 tablet (4 mg total) by mouth every 8 (eight) hours as needed for nausea or vomiting.  . polyethylene glycol (MIRALAX / GLYCOLAX) packet Take 17 g by mouth daily.  . traMADol (ULTRAM) 50 MG tablet Take 50 mg by mouth. Take one tablet four times daily with meals and at bedtime  . trimethoprim (TRIMPEX) 100 MG tablet Take 100 mg by mouth at bedtime.  Marland Kitchen zolpidem (AMBIEN) 5 MG tablet Take one half tablet at bedtime if needed for rest   No facility-administered encounter medications on file as of 05/18/2016.     Review of Systems  Constitutional: Positive for activity change, appetite change and fatigue. Negative for fever and unexpected weight change.  HENT: Positive for hearing loss (bilateral earing aides). Negative for ear pain.   Eyes:       History of loss of visual acuity and macular degeneration.  Respiratory: Negative for cough and shortness of breath.        History of breast discomfort. History of removal of a lump of cancer from the right breast. Dyspnea on exertion.  Cardiovascular: Positive for leg swelling (tight, shiny skin). Negative for chest pain and palpitations.  Gastrointestinal: Positive for nausea (improved on Zofran prior to each meal).       She frequently complains that her intestines are not working right. There is a previous history of diverticulosis. Her last colonoscopy was in 2008. She has been seen by Dr. Paulita Fujita in the past. Constipation - she associates with Tramadol  Genitourinary: Positive for decreased urine volume, difficulty urinating, dysuria and frequency. Negative for vaginal bleeding and vaginal discharge.       Complains of a slow, weak urinary stream. Denies dysuria. Has increased frequency and nocturia. Saw Dr. Louis Meckel, urologist. She was taught self-catheterization for a bladder that does not empty well. Vaginal discomfort. Managed by Dr. Claudean Kinds. History of elevated CEA 125. Normal ultrasound  of the abdomen and genitourinary tract. Recurrent UTI.  Musculoskeletal: Positive for back pain and gait problem (unstable. using walker.).       Chronic back pain.  Patient has a shorter right leg and has a lift in the right shoe. Now seeing Dr. Rhona Raider.  Right hip arthroplasty 5/16 There is generalized stiffness in the joints. There is a history of spinal stenosis. Left third finger catches on extension sometimes. There is a trigger finger. Pain at the left iliac crest and left anterior ribs near the sternum. Left rib cage mild pain with deep breath and truncal movement.   Skin:       History of skin discoloration and changes of nails.  [place] in late Oct 2017 and contused the left side of the back  Left side bruise.  Allergic/Immunologic: Negative.   Neurological: Positive for dizziness. Negative for tremors, seizures and numbness.       History of spinal stenosis with neurogenic claudication affecting the right leg. Complains of pain in the buttocks that is suggestive of neuralgia from her spinal stenosis. Hospitalized for 24 hours 08/24/2015 for vertigo.  Hematological: Bruises/bleeds easily.  Psychiatric/Behavioral: Positive for dysphoric mood.    Immunization History  Administered Date(s) Administered  . Influenza Whole 10/12/2011, 10/12/2012  . Influenza-Unspecified 10/25/2013, 10/10/2014, 12/12/2015  . PPD Test 07/07/2010  . Pneumococcal Conjugate-13 01/12/2003  . Zoster 01/12/2007   Pertinent  Health Maintenance Due  Topic Date Due  . PNA vac Low Risk Adult (2 of 2 - PPSV23) 01/12/2004  . INFLUENZA VACCINE  08/11/2016  . MAMMOGRAM  08/11/2016  . DEXA SCAN  Completed   Fall Risk  11/11/2015 10/16/2015 07/03/2015 01/28/2015 10/22/2014  Falls in the past year? Yes No No No No  Number falls in past yr: 1 - - - -  Injury with Fall? Yes - - - -  Risk Factor Category  High Fall Risk - - - -  Risk for fall due to : History of fall(s);Impaired balance/gait;Impaired mobility - -  - -  Follow up Falls evaluation completed - - - -   Functional Status Survey:    Vitals:   05/18/16 1408  BP: (!) 141/76  Pulse: 76  Resp: 16  Temp: 99.4 F (37.4 C)  Weight: 133 lb 3.2 oz (60.4 kg)  Height: 5\' 1"  (1.549 m)   Body mass index is 25.17 kg/m. Physical Exam  Constitutional: She is oriented to person, place, and time. She appears well-developed and well-nourished. No distress.  HENT:  HOH Nonocclusive wax in both EAC.  Eyes:  Corrective lenses.  Neck: No JVD present. No tracheal deviation present. No thyromegaly present.  Cardiovascular: Normal rate, regular rhythm and normal heart sounds.  Exam reveals no gallop and no friction rub.   No murmur heard. Pulses in RLE not palpable, has 2+ edema which could be obscuring pulses  Pulmonary/Chest: Effort normal and breath sounds normal. She has no rales.  Abdominal: Soft. Bowel sounds are normal. She exhibits no distension and no mass. There is no tenderness.  Musculoskeletal: Normal range of motion. She exhibits edema (both lower legs and has tight, shiny skin. Painless) and tenderness (T12-L1 area).  Trace edema BLE.   Lymphadenopathy:    She has no cervical adenopathy.  Neurological: She is alert and oriented to person, place, and time. No cranial nerve deficit.  Mild memory loss  Skin: Skin is warm and dry. There is pallor.  Psychiatric: Her behavior is normal. Thought content normal.    Labs reviewed:  Recent Labs  12/03/15 0716 01/19/16 0800  02/18/16 1204 03/11/16 1307 03/22/16 1445 04/05/16 04/08/16 04/26/16 1412  NA 133* 134*  < > 129* 126* 133* 131* 133* 135*  K 4.2 4.5  < > 4.2 4.8 4.7 4.5 4.7 4.5  CL 101 98  --  98*  --   --   --   --   --   CO2 24 28  < > 23 24 26   --   --  27  GLUCOSE 118* 107*  < > 108* 119 105  --   --  125  BUN 19 15  < > 13 10.1 11.3 10 10  11.3  CREATININE 0.80 0.81  < > 0.71 0.7 0.8 0.7 0.7 0.9  CALCIUM 9.3 9.5  < >  8.9 8.7 8.3*  --   --  7.7*  < > = values in this  interval not displayed.  Recent Labs  03/11/16 1307 03/22/16 1445 04/05/16 04/08/16 04/26/16 1412  AST 32 49* 37* 46* 36*  ALT 22 45 30 35 25  ALKPHOS 420* 358* 333* 332* 264*  BILITOT 0.34 0.29  --   --  0.37  PROT 6.1* 5.7*  --   --  6.2*  ALBUMIN 3.4* 3.1*  --   --  3.4*    Recent Labs  03/11/16 1307 03/22/16 1445 04/05/16 04/08/16 04/26/16 1411  WBC 6.2 6.7 7.3 8.0 6.5  NEUTROABS 3.7 3.5  --   --  3.3  HGB 10.9* 10.2* 10.2* 10.4* 11.3*  HCT 32.5* 30.9* 32* 32* 34.6*  MCV 93.7 93.6  --   --  92.4  PLT 210 204 302 298 230   Lab Results  Component Value Date   TSH 3.215 08/25/2015   Lab Results  Component Value Date   HGBA1C 5.9 01/23/2015   No results found for: CHOL, HDL, LDLCALC, LDLDIRECT, TRIG, CHOLHDL  Significant Diagnostic Results in last 30 days:  Nm Pet Image Restag (ps) Skull Base To Thigh  Result Date: 05/10/2016 CLINICAL DATA:  Subsequent treatment strategy for restaging have right breast cancer. Left adrenal metastasis. EXAM: NUCLEAR MEDICINE PET SKULL BASE TO THIGH TECHNIQUE: 8.3 mCi F-18 FDG was injected intravenously. Full-ring PET imaging was performed from the skull base to thigh after the radiotracer. CT data was obtained and used for attenuation correction and anatomic localization. FASTING BLOOD GLUCOSE:  Value: 104 mg/dl COMPARISON:  03/16/2016 FINDINGS: NECK No cervical nodal hypermetabolism. Carotid atherosclerosis. No cervical adenopathy. CHEST Right paratracheal node measures 8 mm and a S.U.V. max of 3.3 on image 56/series 4. Compare 9 mm and a S.U.V. max of 4.2 on the prior. Subcarinal node measures 8 mm and a S.U.V. max of 5.6 versus similar in size and a S.U.V. max of 4.4 on the prior exam. Medial right lung base nodularity is less well-defined today. Measures a S.U.V. max of 2.2 on image 52/series 8. Compare a S.U.V. max of 3.7 on the prior exam. An area of more cephalad anterior right upper or right middle lobe Mild hypermetabolic pulmonary  opacity is favored to represent scarring, including on image 30/series 8. Bilateral areas of chronic consolidation which are likely due to post infectious or inflammatory scarring and possible radiation fibrosis. Moderate right pleural effusion is similar. ABDOMEN/PELVIS Left adrenal enlargement again identified. This measures 1.6 x 1.2 cm and a S.U.V. max of 7.1 on image 96/series 4. Compare 1.8 x 1.1 cm and a S.U.V. max of 6.0 on the prior exam. Right adrenal hypermetabolism measures a S.U.V. max of 4.9 today versus a S.U.V. max of 4.3 on the prior. No abdominopelvic nodal hypermetabolism. Degraded evaluation of the pelvis, secondary to beam hardening artifact from right hip arthroplasty. Suspect trace right pelvic fluid, including on image 147/ series 4. Unchanged. Colonic stool burden suggests constipation. SKELETON Multifocal rib hypermetabolism, primarily corresponding to chronic fractures. Example in the left first rib measuring a S.U.V. max of 3.7 today versus a S.U.V. max of 5.5 on the prior. Right hip arthroplasty. Relatively diffuse heterogeneous sclerosis is similar. Left greater than right rib fractures, many of which have undergone interval healing. IMPRESSION: 1. Overall similar disease burden compared to the prior PET. 2. Some lesions are mildly increased in hypermetabolism, while others are similar to improved. 3. No new sites of disease identified.  4. Similar moderate right pleural effusion. Electronically Signed   By: Abigail Miyamoto M.D.   On: 05/10/2016 09:14    Assessment/Plan UTI (urinary tract infection)  urine culture 05/15/16 showed Coag neg Staph >100,000c/ml, 5 day course of Gentamicin 60mg  bid IM started and tolerated.    Hypertension Normalized, continue Furosemide 40mg  daily  Constipation Stable, continue MiraLax daily.   GERD (gastroesophageal reflux disease) Better, continue  Zofran 4mg  qam, , Mirtazapine 7.5mg  qhs, Omeprazole 20mg  daily.    Primary osteoarthritis of  right hip X-ray 04/02/16 L ribs, L spine, T spine showed acute mildly displaced left first to tenth rib fractures. T11 vertebral body 80% reduction in height.  Pain is managed with Tramadol qid, prn Ibuprofen, prn Tylenol   Urinary retention Prn I+O cath  Metastatic breast cancer [facility]) Continue supportive care, continue Arimidex.   Anemia, chronic disease Last Hgb 10.4 04/08/16  Insomnia Takes Zolpidem 5mg  prn at night for sleep  Hyponatremia Last Na 133 04/08/16  Depression Her mood is managed with Alprazolam qam, bid prn, Mirtazapine 7.5mg  daily.   Edema trace edema BLE, chronic, monitor weight, continue Furosemide 40mg  daily      Family/ staff Communication: AL  Labs/tests ordered:  Urine culture done 05/15/16

## 2016-05-18 NOTE — Assessment & Plan Note (Signed)
Her mood is managed with Alprazolam qam, bid prn, Mirtazapine 7.5mg  daily.

## 2016-05-18 NOTE — Assessment & Plan Note (Signed)
Takes Zolpidem 5mg  prn at night for sleep

## 2016-05-18 NOTE — Assessment & Plan Note (Signed)
urine culture 05/15/16 showed Coag neg Staph >100,000c/ml, 5 day course of Gentamicin 60mg  bid IM started and tolerated.

## 2016-05-20 ENCOUNTER — Encounter (HOSPITAL_COMMUNITY): Payer: Self-pay | Admitting: Emergency Medicine

## 2016-05-20 ENCOUNTER — Emergency Department (HOSPITAL_COMMUNITY)
Admission: EM | Admit: 2016-05-20 | Discharge: 2016-05-20 | Disposition: A | Discharge status: Home / Self Care | Payer: Medicare Other | Attending: Emergency Medicine | Admitting: Emergency Medicine

## 2016-05-20 DIAGNOSIS — R1010 Upper abdominal pain, unspecified: Secondary | ICD-10-CM | POA: Insufficient documentation

## 2016-05-20 DIAGNOSIS — Z79899 Other long term (current) drug therapy: Secondary | ICD-10-CM | POA: Insufficient documentation

## 2016-05-20 DIAGNOSIS — I10 Essential (primary) hypertension: Secondary | ICD-10-CM | POA: Diagnosis not present

## 2016-05-20 DIAGNOSIS — Z96641 Presence of right artificial hip joint: Secondary | ICD-10-CM | POA: Diagnosis not present

## 2016-05-20 DIAGNOSIS — Z7982 Long term (current) use of aspirin: Secondary | ICD-10-CM | POA: Diagnosis not present

## 2016-05-20 DIAGNOSIS — R1013 Epigastric pain: Secondary | ICD-10-CM | POA: Diagnosis present

## 2016-05-20 DIAGNOSIS — Z853 Personal history of malignant neoplasm of breast: Secondary | ICD-10-CM | POA: Insufficient documentation

## 2016-05-20 LAB — CBC WITH DIFFERENTIAL/PLATELET
Basophils Absolute: 0 10*3/uL (ref 0.0–0.1)
Basophils Relative: 0 %
EOS ABS: 0.3 10*3/uL (ref 0.0–0.7)
EOS PCT: 4 %
HCT: 34.1 % — ABNORMAL LOW (ref 36.0–46.0)
Hemoglobin: 10.9 g/dL — ABNORMAL LOW (ref 12.0–15.0)
Lymphocytes Relative: 37 %
Lymphs Abs: 2.5 10*3/uL (ref 0.7–4.0)
MCH: 29.4 pg (ref 26.0–34.0)
MCHC: 32 g/dL (ref 30.0–36.0)
MCV: 91.9 fL (ref 78.0–100.0)
MONO ABS: 0.9 10*3/uL (ref 0.1–1.0)
MONOS PCT: 13 %
Neutro Abs: 3.1 10*3/uL (ref 1.7–7.7)
Neutrophils Relative %: 46 %
PLATELETS: 235 10*3/uL (ref 150–400)
RBC: 3.71 MIL/uL — ABNORMAL LOW (ref 3.87–5.11)
RDW: 16.1 % — AB (ref 11.5–15.5)
WBC: 6.8 10*3/uL (ref 4.0–10.5)

## 2016-05-20 LAB — COMPREHENSIVE METABOLIC PANEL
ALBUMIN: 3.2 g/dL — AB (ref 3.5–5.0)
ALT: 21 U/L (ref 14–54)
ANION GAP: 6 (ref 5–15)
AST: 39 U/L (ref 15–41)
Alkaline Phosphatase: 137 U/L — ABNORMAL HIGH (ref 38–126)
BUN: 15 mg/dL (ref 6–20)
CO2: 28 mmol/L (ref 22–32)
Calcium: 7.6 mg/dL — ABNORMAL LOW (ref 8.9–10.3)
Chloride: 100 mmol/L — ABNORMAL LOW (ref 101–111)
Creatinine, Ser: 0.81 mg/dL (ref 0.44–1.00)
GFR calc non Af Amer: >60 mL/min (ref >60)
GLUCOSE: 111 mg/dL — AB (ref 65–99)
POTASSIUM: 4.1 mmol/L (ref 3.5–5.1)
SODIUM: 134 mmol/L — AB (ref 135–145)
Total Bilirubin: 0.3 mg/dL (ref 0.3–1.2)
Total Protein: 5.9 g/dL — ABNORMAL LOW (ref 6.5–8.1)

## 2016-05-20 LAB — URINALYSIS, ROUTINE W REFLEX MICROSCOPIC
BACTERIA UA: NONE SEEN
Bilirubin Urine: NEGATIVE
GLUCOSE, UA: NEGATIVE mg/dL
HGB URINE DIPSTICK: NEGATIVE
KETONES UR: NEGATIVE mg/dL
Nitrite: NEGATIVE
PH: 6 (ref 5.0–8.0)
PROTEIN: NEGATIVE mg/dL
Specific Gravity, Urine: 1.013 (ref 1.005–1.030)

## 2016-05-20 LAB — LIPASE, BLOOD: Lipase: 26 U/L (ref 11–51)

## 2016-05-20 NOTE — ED Notes (Signed)
Ptar called 

## 2016-05-20 NOTE — ED Provider Notes (Signed)
[facility] DEPT Provider Note   CSN: [id number] Arrival date & time: 05/20/16  1609     History   Chief Complaint Chief Complaint  Patient presents with  . Abdominal Pain    HPI Cassandra Glenn is a 83 y.o. female.  Patient states that she had epigastric abdominal pain that lasted a few minutes today he went into her back. She had no shortness of breath no sweating and does not have the pain now   The history is provided by the patient. No language interpreter was used.  Abdominal Pain   This is a new problem. The current episode started less than 1 hour ago. The problem occurs rarely. The problem has been resolved. The pain is associated with an unknown factor. The pain is located in the epigastric region. The quality of the pain is aching. The pain is at a severity of 4/10. The pain is moderate. Pertinent negatives include diarrhea, frequency, hematuria and headaches.    Past Medical History:  Diagnosis Date  . Abnormal PET scan of mediastinum 11/11/2015  . Abnormalities of the hair 02/29/2012  . Arthritis   . BPV (benign positional vertigo)   . Cancer ([place])   . Cancer antigen 125 (CA 125) elevation 11/27/2013  . Candidiasis of skin and nails 03/19/2011  . 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 Chorioretinal scar, macular 01/23/2015  . Closed fracture of base of neck of femur ([place]) 03/18/2011  . Compression fracture of thoracic vertebra (HCC) 11/24/2012  . Contact dermatitis and other eczema, due to unspecified cause 05/07/2011  . Depression 07/23/2014  . Diverticulosis of colon (without mention of hemorrhage) 03/19/2011  . Edema 05/07/2011  . HOH (hard of hearing)   . Hypertension 02/24/2016  . Hypopotassemia 03/26/2011  . Hyposmolality and/or hyponatremia 03/19/2011  . Hypotension, unspecified 03/18/2011  . IBS (irritable bowel syndrome) 11/27/2013  . Idiopathic scoliosis 01/27/2016  . Impaired fasting glucose 03/19/2011  . Insomnia, unspecified 03/19/2011  .  Kidney infection   . Lumbago 03/19/2011  . Macular degeneration   . Malignant neoplasm of breast (female), unspecified site 10/28/2002  . Mass of right lung 10/23/2015  . Metastasis to adrenal gland ([place]) 11/21/2015   Breast cancer  . Metastatic breast cancer ([place]) 10/27/2004  . Nonexudative age-related macular degeneration 12/10/2010  . Osteoporosis 08/06/2014  . Osteoporosis, unspecified 03/19/2011  . Other malaise and fatigue 03/19/2011  . Pain in joint, pelvic region and thigh 03/19/2011  . Primary osteoarthritis of right hip 05/28/2014   TOTAL HIP ARTHROPLASTY ANTERIOR APPROACH HARDWARE REMOVAL on 05/28/2014   . Purpura senilis ([place]) 07/31/2013  . Radiculopathy, cervical 03/23/2016   Right C6  . Rib fractures   . Right bundle branch block 08/30/2011  . Shortness of breath   . Spasm of muscle 03/19/2011  . Spinal stenosis, lumbar region, with neurogenic claudication 11/02/2011  . TMJ click 05/28/6158  . Trigger finger (acquired) 11/02/2011  . Unspecified constipation 03/19/2011  . Urinary frequency 04/24/2013  . UTI (urinary tract infection) 04/16/2013   04/16/13 pseudomonas aeruginosa: tx with Cipro 06/15/14 P. Aeruginosa [name] 500mg  IM q12h x 7 days.     . Vaginitis and vulvovaginitis 02/29/2012  . Vertigo 08/26/2015    Patient Active Problem List   Diagnosis Date Noted  . Nausea 05/11/2016  . GERD (gastroesophageal reflux disease) 05/04/2016  . Edema 04/01/2016  . Radiculopathy, cervical 03/23/2016  . Pain from bone metastases ([place]) 03/22/2016  . Cough 03/03/2016  . Right arm pain 03/03/2016  . Anxiety 03/03/2016  .  Malaise 02/24/2016  . Hypertension 02/24/2016  . Pain, hand joint 02/24/2016  . Multiple rib fractures 02/11/2016  . Peripheral edema 02/04/2016  . Idiopathic scoliosis 01/27/2016  . Metastasis to adrenal gland ([place]) 11/21/2015  . Abnormal PET scan of mediastinum 11/11/2015  . Mass of right lung 10/23/2015  . BPV (benign positional vertigo)   . Syncope  08/24/2015  . Lightheadedness   . Herpes simplex 07/29/2015  . Chorioretinal scar, macular 01/23/2015  . Osteoporosis 08/06/2014  . Depression 07/23/2014  . Candidiasis of skin 06/25/2014  . Constipation 06/04/2014  . Urinary retention 05/30/2014  . Primary osteoarthritis of right hip 05/28/2014  . Cancer antigen 125 (CA 125) elevation 11/27/2013  . IBS (irritable bowel syndrome) 11/27/2013  . Purpura senilis ([place]) 07/31/2013  . TMJ click 77/82/4235  . Insomnia 04/24/2013  . Urinary frequency 04/24/2013  . Hyponatremia 04/19/2013  . Hyperglycemia 04/19/2013  . History of fall 04/17/2013  . UTI (urinary tract infection) 04/16/2013  . Abnormal CT scan, chest 03/23/2013  . Anemia, chronic disease 12/01/2012  . Compression fracture of thoracic vertebra ([facility]) 11/24/2012  . AMD (age-related macular degeneration), wet ([place]) 05/27/2011  . Closed fracture of base of neck of femur ([place]) 03/18/2011  . 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 Nonexudative age-related macular degeneration 12/10/2010  . Metastatic breast cancer ([place]) 10/27/2004    Past Surgical History:  Procedure Laterality Date  . BREAST SURGERY Bilateral 2005-10/27/2004   lumpectomy- Streck,MD  . CATARACT EXTRACTION  2010   bialteral  . EYE SURGERY     cataract  . FRACTURE SURGERY Left 09/1980   ankle  . HARDWARE REMOVAL Right 05/28/2014   Procedure: HARDWARE REMOVAL;  Surgeon: Melrose Nakayama, MD;  Location: [facility];  Service: Orthopedics;  Laterality: Right;  . HIP PINNING,CANNULATED  03/08/2011   Procedure: CANNULATED HIP PINNING;  Surgeon: Johnn Hai, MD;  Location: [facility];  Service: Orthopedics;  Laterality: Right;  . ORIF HIP FRACTURE Right 03/08/2011   Bean,MD  . SKIN CANCER EXCISION Left 10/12/2012   lower leg Dr. [place]  . SKIN LESION EXCISION Right 02/04/2011   Abdomen lesion spongiotic dermatitis-Taffeen, MD  . SQUAMOUS CELL CARCINOMA EXCISION Right 02/04/2011   forearmSyble Creek, MD  . TONSILLECTOMY      . TOTAL HIP ARTHROPLASTY Right 05/28/2014   Procedure: TOTAL HIP ARTHROPLASTY ANTERIOR APPROACH;  Surgeon: Melrose Nakayama, MD;  Location: [facility];  Service: Orthopedics;  Laterality: Right;    OB History    No data available       Home Medications    Prior to Admission medications   Medication Sig Start Date End Date Taking? Authorizing Provider  acetaminophen (TYLENOL) 500 MG tablet Take 1,000 mg by mouth every 6 (six) hours as needed for moderate pain.    Yes [provider]  ALPRAZolam Duanne Moron) 0.5 MG tablet Take 0.5 mg by mouth daily. Pt is also able to use twice daily as needed for anxiety.   Yes [provider]  anastrozole (ARIMIDEX) 1 MG tablet Take 1 tablet (1 mg total) by mouth daily. 03/22/16  Yes Magrinat, Virgie Dad, MD  antiseptic oral rinse (BIOTENE) LIQD 15 mLs by Mouth Rinse route 2 (two) times daily as needed for dry mouth.    Yes [provider]  aspirin 81 MG chewable tablet Chew 81 mg by mouth daily.    Yes [provider]  Bilberry 150 MG CAPS Take 150 mg by mouth daily.   Yes [provider]  Calcium Carbonate-Vitamin D (  CALCIUM 600+D) 600-400 MG-UNIT tablet Take 1 tablet by mouth 2 (two) times daily.   Yes [provider]  cholecalciferol (VITAMIN D) 400 UNITS TABS Take 400 Units by mouth daily.    Yes [provider]  Cranberry-Cholecalciferol (SUPER CRANBERRY/VITAMIN D3) 4200-500 MG-UNIT CAPS Take 1 capsule by mouth daily.   Yes [provider]  furosemide (LASIX) 40 MG tablet Take 40 mg by mouth daily.    Yes [provider]  GENTAMICIN SULFATE IV Inject 60 mg into the vein 2 (two) times daily. 05/16/16 05/20/16 Yes [provider]  Glucosamine-Chondroitin (GLUCOSAMINE CHONDR COMPLEX PO) Take 1 tablet by mouth daily. for joints   Yes [provider]  guaiFENesin (MUCINEX) 600 MG 12 hr tablet Take 600 mg by mouth 2 (two) times daily as needed for cough.    Yes [provider]  ibuprofen (ADVIL,MOTRIN) 200 MG tablet Take 400 mg by mouth every 8 (eight) hours as needed for mild pain.    Yes [provider]  Lactobacillus-Inulin ([place] PO) Take 1 tablet by mouth daily.   Yes [provider]  meclizine (ANTIVERT) 25 MG tablet Take 1 tablet (25 mg total) by mouth 3 (three) times daily as needed for dizziness. 08/25/15  Yes Thurnell Lose, MD  Meth-Hyo-M Barnett Hatter Phos-Ph Sal (URIBEL) 118 MG CAPS One up to 3 times daily as needed for dysuria Patient taking differently: Take 118 mg by mouth 3 (three) times daily as needed (for dysuria).  05/11/16  Yes Estill Dooms, MD  mirtazapine (REMERON) 7.5 MG tablet Take 7.5 mg by mouth at bedtime.   Yes [provider]  Multiple Vitamins-Minerals (PRESERVISION/LUTEIN) CAPS Take 1 capsule by mouth 2 (two) times daily.   Yes [provider]  nystatin (MYCOSTATIN/NYSTOP) powder Apply to affected skin daily to treat yeast Patient taking differently: Apply 2 g topically daily.  10/29/15  Yes Estill Dooms, MD  omega-3 acid ethyl esters (LOVAZA) 1 g capsule Take 1 g by mouth daily.   Yes [provider]  omeprazole (PRILOSEC) 20 MG capsule Take 20 mg by mouth daily before breakfast.   Yes [provider]  ondansetron (ZOFRAN) 4 MG tablet Take 4 mg by mouth daily.   Yes [provider]  ondansetron (ZOFRAN-ODT) 4 MG disintegrating tablet Take 4 mg by mouth every 8 (eight) hours as needed for nausea or vomiting.   Yes [provider]  polycarbophil (FIBERCON) 625 MG tablet Take 625 mg by mouth 2 (two) times daily.   Yes [provider]  polyethylene glycol (MIRALAX / GLYCOLAX) packet Take 17 g by mouth at bedtime. Pt also uses once daily as needed for constipation.   Yes [provider]  traMADol (ULTRAM) 50 MG tablet Take 50 mg by mouth 4 (four) times daily -  with meals and at bedtime.    Yes [provider]    trimethoprim (TRIMPEX) 100 MG tablet Take 100 mg by mouth at bedtime.   Yes [provider]  zolpidem (AMBIEN) 5 MG tablet Take 5 mg by mouth at bedtime as needed for sleep.   Yes [provider]    Family History Family History  Problem Relation Age of Onset  . Heart disease Mother   . Cancer Father   . Emphysema Brother   . Alzheimer's disease Brother     Social History Social History  Substance Use Topics  . Smoking status: Never Smoker  . Smokeless tobacco: Never Used  .  Alcohol use 0.0 oz/week     Comment: occasional glass     Allergies   Ciprofloxacin; Macrodantin [nitrofurantoin]; Trimethoprim; and Bactrim [sulfamethoxazole-trimethoprim]   Review of Systems Review of Systems  Constitutional: Negative for appetite change and fatigue.  HENT: Negative for congestion, ear discharge and sinus pressure.   Eyes: Negative for discharge.  Respiratory: Negative for cough.   Cardiovascular: Negative for chest pain.  Gastrointestinal: Positive for abdominal pain. Negative for diarrhea.  Genitourinary: Negative for frequency and hematuria.  Musculoskeletal: Negative for back pain.  Skin: Negative for rash.  Neurological: Negative for seizures and headaches.  Psychiatric/Behavioral: Negative for hallucinations.     Physical Exam Updated Vital Signs BP 139/64 (BP Location: Left Arm)   Pulse 76   Temp 98 F (36.7 C) (Oral)   Resp 17   SpO2 98%   Physical Exam  Constitutional: She is oriented to person, place, and time. She appears well-developed.  HENT:  Head: Normocephalic.  Eyes: Conjunctivae and EOM are normal. No scleral icterus.  Neck: Neck supple. No thyromegaly present.  Cardiovascular: Normal rate and regular rhythm.  Exam reveals no gallop and no friction rub.   No murmur heard. Pulmonary/Chest: No stridor. She has no wheezes. She has no rales. She exhibits no tenderness.  Abdominal: She exhibits no distension. There is no tenderness.  There is no rebound.  Musculoskeletal: Normal range of motion. She exhibits no edema.  Lymphadenopathy:    She has no cervical adenopathy.  Neurological: She is oriented to person, place, and time. She exhibits normal muscle tone. Coordination normal.  Skin: No rash noted. No erythema.  Psychiatric: She has a normal mood and affect. Her behavior is normal.     ED Treatments / Results  Labs (all labs ordered are listed, but only abnormal results are displayed) Labs Reviewed  CBC WITH DIFFERENTIAL/PLATELET - Abnormal; Notable for the following:       Result Value   RBC 3.71 (*)    Hemoglobin 10.9 (*)    HCT 34.1 (*)    RDW 16.1 (*)    All other components within normal limits  COMPREHENSIVE METABOLIC PANEL - Abnormal; Notable for the following:    Sodium 134 (*)    Chloride 100 (*)    Glucose, Bld 111 (*)    Calcium 7.6 (*)    Total Protein 5.9 (*)    Albumin 3.2 (*)    Alkaline Phosphatase 137 (*)    All other components within normal limits  URINALYSIS, ROUTINE W REFLEX MICROSCOPIC - Abnormal; Notable for the following:    APPearance HAZY (*)    Leukocytes, UA SMALL (*)    Squamous Epithelial / LPF 0-5 (*)    All other components within normal limits  LIPASE, BLOOD    EKG  EKG Interpretation None       Radiology No results found.  Procedures Procedures (including critical care time)  Medications Ordered in ED Medications - No data to display   Initial Impression / Assessment and Plan / ED Course  I have reviewed the triage vital signs and the nursing notes.  Pertinent labs & imaging results that were available during my care of the patient were reviewed by me and considered in my medical decision making (see chart for details).     Labs were unremarkable except for the white cells in the urine. Patient is presently getting antibiotic injections for UTI. We will culture her urine. Patient no longer had the discomfort in the emergency department.  Patient  will increase her Prilosec to twice a day and will follow-up with her primary care doctor next week or return here if symptoms occur again and do not go away quickly  Final Clinical Impressions(s) / ED Diagnoses   Final diagnoses:  Pain of upper abdomen    New Prescriptions New Prescriptions   No medications on file     Milton Ferguson, MD 05/20/16 [phone]

## 2016-05-20 NOTE — Discharge Instructions (Signed)
Increase her Prilosec to twice a day. We will culture urine and you need to follow-up with your family doctor next week. Return sooner if the pain returns we'll not go away

## 2016-05-20 NOTE — ED Notes (Signed)
Pt aware that urine is needed.  Unable at this time.

## 2016-05-20 NOTE — ED Triage Notes (Signed)
Per [facility] patient comes from [facility] for epigastric/abd pain. Patient states that pain was upper mid abd that radiated to her back.  Patient denies any n/v/d. Staff at [facility] gave patient ASA 324mg .

## 2016-05-20 NOTE — ED Notes (Signed)
Bed: YF11 Expected date:  Expected time:  Means of arrival:  Comments: EMS 83y/o vaginal bleeding

## 2016-05-22 LAB — URINE CULTURE

## 2016-05-24 ENCOUNTER — Other Ambulatory Visit (HOSPITAL_BASED_OUTPATIENT_CLINIC_OR_DEPARTMENT_OTHER): Payer: Medicare Other

## 2016-05-24 ENCOUNTER — Ambulatory Visit: Payer: Medicare Other

## 2016-05-24 ENCOUNTER — Ambulatory Visit (HOSPITAL_BASED_OUTPATIENT_CLINIC_OR_DEPARTMENT_OTHER): Payer: Medicare Other | Admitting: Oncology

## 2016-05-24 ENCOUNTER — Ambulatory Visit: Payer: Medicare Other | Admitting: Oncology

## 2016-05-24 VITALS — BP 143/53 | HR 93 | Temp 97.5°F | Resp 20 | Ht 61.0 in | Wt 131.1 lb

## 2016-05-24 DIAGNOSIS — C50911 Malignant neoplasm of unspecified site of right female breast: Secondary | ICD-10-CM

## 2016-05-24 DIAGNOSIS — G893 Neoplasm related pain (acute) (chronic): Secondary | ICD-10-CM

## 2016-05-24 DIAGNOSIS — C50919 Malignant neoplasm of unspecified site of unspecified female breast: Secondary | ICD-10-CM

## 2016-05-24 DIAGNOSIS — C7972 Secondary malignant neoplasm of left adrenal gland: Secondary | ICD-10-CM

## 2016-05-24 DIAGNOSIS — Z170 Estrogen receptor positive status [ER+]: Secondary | ICD-10-CM

## 2016-05-24 DIAGNOSIS — C7970 Secondary malignant neoplasm of unspecified adrenal gland: Secondary | ICD-10-CM

## 2016-05-24 DIAGNOSIS — C7951 Secondary malignant neoplasm of bone: Secondary | ICD-10-CM

## 2016-05-24 LAB — CBC WITH DIFFERENTIAL/PLATELET
BASO%: 0.3 % (ref 0.0–2.0)
Basophils Absolute: 0 10*3/uL (ref 0.0–0.1)
EOS%: 3.4 % (ref 0.0–7.0)
Eosinophils Absolute: 0.2 10*3/uL (ref 0.0–0.5)
HCT: 36 % (ref 34.8–46.6)
HGB: 11.5 g/dL — ABNORMAL LOW (ref 11.6–15.9)
LYMPH%: 36.2 % (ref 14.0–49.7)
MCH: 29.6 pg (ref 25.1–34.0)
MCHC: 31.9 g/dL (ref 31.5–36.0)
MCV: 92.5 fL (ref 79.5–101.0)
MONO#: 1 10*3/uL — ABNORMAL HIGH (ref 0.1–0.9)
MONO%: 14.5 % — AB (ref 0.0–14.0)
NEUT#: 3.3 10*3/uL (ref 1.5–6.5)
NEUT%: 45.6 % (ref 38.4–76.8)
Platelets: 235 10*3/uL (ref 145–400)
RBC: 3.89 10*6/uL (ref 3.70–5.45)
RDW: 16.2 % — AB (ref 11.2–14.5)
WBC: 7.2 10*3/uL (ref 3.9–10.3)
lymph#: 2.6 10*3/uL (ref 0.9–3.3)
nRBC: 0 % (ref 0–0)

## 2016-05-24 LAB — COMPREHENSIVE METABOLIC PANEL
ALT: 22 U/L (ref 0–55)
AST: 36 U/L — AB (ref 5–34)
Albumin: 3.2 g/dL — ABNORMAL LOW (ref 3.5–5.0)
Alkaline Phosphatase: 154 U/L — ABNORMAL HIGH (ref 40–150)
Anion Gap: 9 mEq/L (ref 3–11)
BUN: 15.3 mg/dL (ref 7.0–26.0)
CHLORIDE: 100 meq/L (ref 98–109)
CO2: 27 mEq/L (ref 22–29)
Calcium: 8.5 mg/dL (ref 8.4–10.4)
Creatinine: 1 mg/dL (ref 0.6–1.1)
EGFR: 49 mL/min/{1.73_m2} — AB (ref >90)
Glucose: 101 mg/dl (ref 70–140)
POTASSIUM: 4.1 meq/L (ref 3.5–5.1)
SODIUM: 137 meq/L (ref 136–145)
Total Bilirubin: 0.36 mg/dL (ref 0.20–1.20)
Total Protein: 6.1 g/dL — ABNORMAL LOW (ref 6.4–8.3)

## 2016-05-24 NOTE — Progress Notes (Signed)
The drugs is already [facility]  [phone] [phone]     ID: Cassandra Glenn DOB: [birth year]  MR#: [id number]  GMW#:[id number]  Patient Care Team: Estill Dooms, MD as PCP - General (Internal Medicine) Melina Modena, [facility], Man X, NP as Nurse Practitioner (Nurse Practitioner) Shon Hough, MD as Consulting Physician (Ophthalmology) Molli Posey, MD as Consulting Physician (Obstetrics and Gynecology) Gerarda Fraction, MD as Consulting Physician (Ophthalmology) Lorelle Gibbs, MD as Consulting Physician (Radiology) Melrose Nakayama, MD as Consulting Physician (Orthopedic Surgery) Gerarda Fraction, MD as Referring Physician (Ophthalmology) Ardis Hughs, MD as Attending Physician (Urology) Alahni Varone, Virgie Dad, MD as Consulting Physician (Oncology) Chauncey Cruel, MD OTHER MD:  CHIEF COMPLAINT: 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 Cassandra Glenn continues on anastroxzole, with good tolerance.   Hot flashes and vaginal dryness are not a major issue. She never developed the arthralgias or myalgias that many patients can experience on this medication. Cassandra Glenn is not an issue  Since her last visit here she was restaged with a PET scan. This showed evidence of response (decreased SUV, no new lesions) except possibly for a smal lung lesion which may be new.  REVIEW OF SYSTEMS: [name] continues ot have significant reflux problems, which took her again to the ED. She does a bit better on prilosed 20/day but this can be optimized. She complains of dry mouth. Otherwise things are going"pretty evenly." A detaile ROS was otherwise stable  BREAST CANCER HISTORY: From the original intake note:  I saw Cassandra Glenn approximately approximately 13 years ago  when she was diagnosed with right-sided invasive breast cancer, which was treated with radiation and tamoxifen for 5 years. She also had a noninvasive breast cancer removed from the left breast at the same time.  More recently Cassandra Glenn had a chest x-ray 10/22/2015 for evaluation of a cough. The film showed a variety of incidental findings including atherosclerosis of the thoracic aorta and an old lower thoracic compression fracture. There was also however a right apical density which required further evaluation. Accordingly on 10/27/2015 she had a chest CT scan which found chronic areas of scarring in both lungs, not significantly changed from November 2015. However right middle lobe pleural thickening appeared worse, and there was increased nodular pleural thickening along the azygo-esophageal recess, suggestive of an indolent mesothelioma. There was a small cluster of nodules at the medial right lung base which was new from the prior study. There were no bony metastases but there were multiple chronic compression deformities and degenerative change.   Given these results, a PET scan was obtained 11/07/2015. There was a hypermetabolic right paratracheal lymph node measuring 1.2 cm, but no additional mediastinal adenopathy. Along the pleural surface of the azygo-esophageal region there was increased metabolic activity with an SUV max of 20.9. There was also intense metabolic activity associated with a thickened left adrenal gland, with an SUV max of 10.8. There was some hypermetabolic activity associated with the right adrenal gland. There were again no bony metastatic disease findings.  With this information the patient returns for further evaluation and treatment.   PAST MEDICAL HISTORY: Past Medical History:  Diagnosis Date  . Abnormal PET scan of mediastinum 11/11/2015  . Abnormalities of the hair 02/29/2012  . Arthritis   . BPV (benign positional vertigo)   . Cancer ([place])   .  Cancer antigen 125  (CA 125) elevation 11/27/2013  . Candidiasis of skin and nails 03/19/2011  . 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 Chorioretinal scar, macular 01/23/2015  . Closed fracture of base of neck of femur ([place]) 03/18/2011  . Compression fracture of thoracic vertebra ([facility]) 11/24/2012  . Contact dermatitis and other eczema, due to unspecified cause 05/07/2011  . Depression 07/23/2014  . Diverticulosis of colon (without mention of hemorrhage) 03/19/2011  . Edema 05/07/2011  . HOH (hard of hearing)   . Hypertension 02/24/2016  . Hypopotassemia 03/26/2011  . Hyposmolality and/or hyponatremia 03/19/2011  . Hypotension, unspecified 03/18/2011  . IBS (irritable bowel syndrome) 11/27/2013  . Idiopathic scoliosis 01/27/2016  . Impaired fasting glucose 03/19/2011  . Insomnia, unspecified 03/19/2011  . Kidney infection   . Lumbago 03/19/2011  . Macular degeneration   . Malignant neoplasm of breast (female), unspecified site 10/28/2002  . Mass of right lung 10/23/2015  . Metastasis to adrenal gland ([place]) 11/21/2015   Breast cancer  . Metastatic breast cancer ([place]) 10/27/2004  . Nonexudative age-related macular degeneration 12/10/2010  . Osteoporosis 08/06/2014  . Osteoporosis, unspecified 03/19/2011  . Other malaise and fatigue 03/19/2011  . Pain in joint, pelvic region and thigh 03/19/2011  . Primary osteoarthritis of right hip 05/28/2014   TOTAL HIP ARTHROPLASTY ANTERIOR APPROACH HARDWARE REMOVAL on 05/28/2014   . Purpura senilis ([place]) 07/31/2013  . Radiculopathy, cervical 03/23/2016   Right C6  . Rib fractures   . Right bundle branch block 08/30/2011  . Shortness of breath   . Spasm of muscle 03/19/2011  . Spinal stenosis, lumbar region, with neurogenic claudication 11/02/2011  . TMJ click 5/46/5681  . Trigger finger (acquired) 11/02/2011  . Unspecified constipation 03/19/2011  . Urinary frequency 04/24/2013  . UTI (urinary tract infection) 04/16/2013   04/16/13 pseudomonas aeruginosa: tx with Cipro  06/15/14 P. Aeruginosa Fortaz 571m IM q12h x 7 days.     . Vaginitis and vulvovaginitis 02/29/2012  . Vertigo 08/26/2015    PAST SURGICAL HISTORY: Past Surgical History:  Procedure Laterality Date  . BREAST SURGERY Bilateral 2005-10/27/2004   lumpectomy- Streck,MD  . CATARACT EXTRACTION  2010   bialteral  . EYE SURGERY     cataract  . FRACTURE SURGERY Left 09/1980   ankle  . HARDWARE REMOVAL Right 05/28/2014   Procedure: HARDWARE REMOVAL;  Surgeon: PMelrose Nakayama MD;  Location: [facility]  Service: Orthopedics;  Laterality: Right;  . HIP PINNING,CANNULATED  03/08/2011   Procedure: CANNULATED HIP PINNING;  Surgeon: JJohnn Hai MD;  Location: [facility];  Service: Orthopedics;  Laterality: Right;  . ORIF HIP FRACTURE Right 03/08/2011   Bean,MD  . SKIN CANCER EXCISION Left 10/12/2012   lower leg Dr. [place] . SKIN LESION EXCISION Right 02/04/2011   Abdomen lesion spongiotic dermatitis-Taffeen, MD  . SQUAMOUS CELL CARCINOMA EXCISION Right 02/04/2011   forearm-[place] MD  . TONSILLECTOMY    . TOTAL HIP ARTHROPLASTY Right 05/28/2014   Procedure: TOTAL HIP ARTHROPLASTY ANTERIOR APPROACH;  Surgeon: PMelrose Nakayama MD;  [facility]  Service: Orthopedics;  Laterality: Right;    FAMILY HISTORY Family History  Problem Relation Age of Onset  . Heart disease Mother   . Cancer Father   . Emphysema Brother   . Alzheimer's disease Brother   The patient's father died at the age of 752with colon cancer. The patient's mother died at the age of 850from heart disease the patient had 2 brothers, no sisters. Both brothers have died, one  from emphysema and one from dementia.  GYNECOLOGIC HISTORY:  No LMP recorded. Patient is postmenopausal. Menarche age 35, first live birth age 58, the patient is Cassandra Glenn P2. She stopped having periods around age 24. She never took hormone replacement.  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 She is widowed. She lives  in independent living section of [facility] [place]. Her daughter Cassandra Glenn lives in [place] (actually [place]) where she teaches. Daughter Cassandra Glenn lives in [place] and is Web designer at [employer]. The patient has 5 grandchildren and 2 step grandchildren. She has one great grandchild and one on the way. She attends [employer]    ADVANCED DIRECTIVES: Cassandra Glenn has named both her daughters as joint health care power of attorney. She tells me she has a living will "somewhere in the house" and has a DO NOT RESUSCITATE order written at [facility].   HEALTH MAINTENANCE: Social History  Substance Use Topics  . Smoking status: Never Smoker  . Smokeless tobacco: Never Used  . Alcohol use 0.0 oz/week     Comment: occasional glass     Colonoscopy:  PAP:  Bone density:   Allergies  Allergen Reactions  . Ciprofloxacin Other (See Comments)    Reaction:  Unknown   . Macrodantin [Nitrofurantoin] Other (See Comments)    Reaction:  Unknown   . Trimethoprim Other (See Comments)    Reaction:  Unknown   . Bactrim [Sulfamethoxazole-Trimethoprim] Other (See Comments)    Reaction:  Unknown     Current Outpatient Prescriptions  Medication Sig Dispense Refill  . acetaminophen (TYLENOL) 500 MG tablet Take 1,000 mg by mouth every 6 (six) hours as needed for moderate pain.     Marland Kitchen ALPRAZolam (XANAX) 0.5 MG tablet Take 0.5 mg by mouth daily. Pt is also able to use twice daily as needed for anxiety.    Marland Kitchen anastrozole (ARIMIDEX) 1 MG tablet Take 1 tablet (1 mg total) by mouth daily. 90 tablet 4  . antiseptic oral rinse (BIOTENE) LIQD 15 mLs by Mouth Rinse route 2 (two) times daily as needed for dry mouth.     Marland Kitchen aspirin 81 MG chewable tablet Chew 81 mg by mouth daily.     . Bilberry 150 MG CAPS Take 150 mg by mouth daily.    . Calcium Carbonate-Vitamin D (CALCIUM 600+D) 600-400 MG-UNIT tablet Take 1 tablet by mouth 2 (two) times daily.    .  cholecalciferol (VITAMIN D) 400 UNITS TABS Take 400 Units by mouth daily.     . Cranberry-Cholecalciferol (SUPER CRANBERRY/VITAMIN D3) 4200-500 MG-UNIT CAPS Take 1 capsule by mouth daily.    . furosemide (LASIX) 40 MG tablet Take 40 mg by mouth daily.     . Glucosamine-Chondroitin (GLUCOSAMINE CHONDR COMPLEX PO) Take 1 tablet by mouth daily. for joints    . guaiFENesin (MUCINEX) 600 MG 12 hr tablet Take 600 mg by mouth 2 (two) times daily as needed for cough.     Marland Kitchen ibuprofen (ADVIL,MOTRIN) 200 MG tablet Take 400 mg by mouth every 8 (eight) hours as needed for mild pain.     . Lactobacillus-Inulin (CULTURELLE DIGESTIVE HEALTH PO) Take 1 tablet by mouth daily.    . meclizine (ANTIVERT) 25 MG tablet Take 1 tablet (25 mg total) by mouth 3 (three) times daily as needed for dizziness. 30 tablet 0  . Meth-Hyo-M Bl-Na Phos-Ph Sal (URIBEL) 118 MG CAPS One up to 3 times daily as  needed for dysuria (Patient taking differently: Take 118 mg by mouth 3 (three) times daily as needed (for dysuria). ) 50 capsule 5  . mirtazapine (REMERON) 7.5 MG tablet Take 7.5 mg by mouth at bedtime.    . Multiple Vitamins-Minerals (PRESERVISION/LUTEIN) CAPS Take 1 capsule by mouth 2 (two) times daily.    Marland Kitchen nystatin (MYCOSTATIN/NYSTOP) powder Apply to affected skin daily to treat yeast (Patient taking differently: Apply 2 g topically daily. ) 45 g 4  . omega-3 acid ethyl esters (LOVAZA) 1 g capsule Take 1 g by mouth daily.    Marland Kitchen omeprazole (PRILOSEC) 20 MG capsule Take 20 mg by mouth daily before breakfast.    . ondansetron (ZOFRAN) 4 MG tablet Take 4 mg by mouth daily.    . ondansetron (ZOFRAN-ODT) 4 MG disintegrating tablet Take 4 mg by mouth every 8 (eight) hours as needed for nausea or vomiting.    . polycarbophil (FIBERCON) 625 MG tablet Take 625 mg by mouth 2 (two) times daily.    . polyethylene glycol (MIRALAX / GLYCOLAX) packet Take 17 g by mouth at bedtime. Pt also uses once daily as needed for constipation.    . traMADol  (ULTRAM) 50 MG tablet Take 50 mg by mouth 4 (four) times daily -  with meals and at bedtime.     Marland Kitchen trimethoprim (TRIMPEX) 100 MG tablet Take 100 mg by mouth at bedtime.    Marland Kitchen zolpidem (AMBIEN) 5 MG tablet Take 5 mg by mouth at bedtime as needed for sleep.     No current facility-administered medications for this visit.     OBJECTIVE: older White woman who appears states age  31:   05/24/16 1520  BP: (!) 143/53  Pulse: 93  Resp: 20  Temp: 97.5 F (36.4 C)     Body mass index is 24.77 kg/m.    ECOG FS:1 - Symptomatic but completely ambulatory   Sclerae unicteric, EOMs intact Oropharynx clear and moist No cervical or supraclavicular adenopathy Lungs no rales or rhonchi Heart regular rate and rhythm Abd soft, nontender, positive bowel sounds MSK no focal spinal tenderness, no upper extremity lymphedema Neuro: nonfocal, well oriented, appropriate affect Breasts: Deferred   LAB RESULTS:  CMP     Component Value Date/Time   NA 137 05/24/2016 1504   K 4.1 05/24/2016 1504   CL 100 (L) 05/20/2016 1658   CO2 27 05/24/2016 1504   GLUCOSE 101 05/24/2016 1504   BUN 15.3 05/24/2016 1504   CREATININE 1.0 05/24/2016 1504   CALCIUM 8.5 05/24/2016 1504   PROT 6.1 (L) 05/24/2016 1504   ALBUMIN 3.2 (L) 05/24/2016 1504   AST 36 (H) 05/24/2016 1504   ALT 22 05/24/2016 1504   ALKPHOS 154 (H) 05/24/2016 1504   BILITOT 0.36 05/24/2016 1504   GFRNONAA >60 05/20/2016 1658   GFRNONAA 63 01/19/2016 0800   GFRAA >60 05/20/2016 1658   GFRAA 72 01/19/2016 0800    INo results found for: SPEP, UPEP  Lab Results  Component Value Date   WBC 7.2 05/24/2016   NEUTROABS 3.3 05/24/2016   HGB 11.5 (L) 05/24/2016   HCT 36.0 05/24/2016   MCV 92.5 05/24/2016   PLT 235 05/24/2016      Chemistry      Component Value Date/Time   NA 137 05/24/2016 1504   K 4.1 05/24/2016 1504   CL 100 (L) 05/20/2016 1658   CO2 27 05/24/2016 1504   BUN 15.3 05/24/2016 1504   CREATININE 1.0 05/24/2016 1504  GLU 109 04/08/2016      Component Value Date/Time   CALCIUM 8.5 05/24/2016 1504   ALKPHOS 154 (H) 05/24/2016 1504   AST 36 (H) 05/24/2016 1504   ALT 22 05/24/2016 1504   BILITOT 0.36 05/24/2016 1504       Lab Results  Component Value Date   LABCA2 19 11/07/2007    No components found for: [id number]  No results for input(s): INR in the last 168 hours.  Urinalysis    Component Value Date/Time   COLORURINE YELLOW 05/20/2016 1635   APPEARANCEUR HAZY (A) 05/20/2016 1635   LABSPEC 1.013 05/20/2016 1635   LABSPEC 1.015 03/11/2016 1307   PHURINE 6.0 05/20/2016 1635   GLUCOSEU NEGATIVE 05/20/2016 1635   GLUCOSEU Negative 03/11/2016 1307   HGBUR NEGATIVE 05/20/2016 1635   BILIRUBINUR NEGATIVE 05/20/2016 1635   BILIRUBINUR Negative 03/11/2016 1307   KETONESUR NEGATIVE 05/20/2016 1635   PROTEINUR NEGATIVE 05/20/2016 1635   UROBILINOGEN 0.2 03/11/2016 1307   NITRITE NEGATIVE 05/20/2016 1635   LEUKOCYTESUR SMALL (A) 05/20/2016 1635   LEUKOCYTESUR Color Interference 03/11/2016 1307     STUDIES: Nm Pet Image Restag (ps) Skull Base To Thigh  Result Date: 05/10/2016 CLINICAL DATA:  Subsequent treatment strategy for restaging have right breast cancer. Left adrenal metastasis. EXAM: NUCLEAR MEDICINE PET SKULL BASE TO THIGH TECHNIQUE: 8.3 mCi F-18 FDG was injected intravenously. Full-ring PET imaging was performed from the skull base to thigh after the radiotracer. CT data was obtained and used for attenuation correction and anatomic localization. FASTING BLOOD GLUCOSE:  Value: 104 mg/dl COMPARISON:  03/16/2016 FINDINGS: NECK No cervical nodal hypermetabolism. Carotid atherosclerosis. No cervical adenopathy. CHEST Right paratracheal node measures 8 mm and a S.U.V. max of 3.3 on image 56/series 4. Compare 9 mm and a S.U.V. max of 4.2 on the prior. Subcarinal node measures 8 mm and a S.U.V. max of 5.6 versus similar in size and a S.U.V. max of 4.4 on the prior exam. Medial right lung base  nodularity is less well-defined today. Measures a S.U.V. max of 2.2 on image 52/series 8. Compare a S.U.V. max of 3.7 on the prior exam. An area of more cephalad anterior right upper or right middle lobe Mild hypermetabolic pulmonary opacity is favored to represent scarring, including on image 30/series 8. Bilateral areas of chronic consolidation which are likely due to post infectious or inflammatory scarring and possible radiation fibrosis. Moderate right pleural effusion is similar. ABDOMEN/PELVIS Left adrenal enlargement again identified. This measures 1.6 x 1.2 cm and a S.U.V. max of 7.1 on image 96/series 4. Compare 1.8 x 1.1 cm and a S.U.V. max of 6.0 on the prior exam. Right adrenal hypermetabolism measures a S.U.V. max of 4.9 today versus a S.U.V. max of 4.3 on the prior. No abdominopelvic nodal hypermetabolism. Degraded evaluation of the pelvis, secondary to beam hardening artifact from right hip arthroplasty. Suspect trace right pelvic fluid, including on image 147/ series 4. Unchanged. Colonic stool burden suggests constipation. SKELETON Multifocal rib hypermetabolism, primarily corresponding to chronic fractures. Example in the left first rib measuring a S.U.V. max of 3.7 today versus a S.U.V. max of 5.5 on the prior. Right hip arthroplasty. Relatively diffuse heterogeneous sclerosis is similar. Left greater than right rib fractures, many of which have undergone interval healing. IMPRESSION: 1. Overall similar disease burden compared to the prior PET. 2. Some lesions are mildly increased in hypermetabolism, while others are similar to improved. 3. No new sites of disease identified. 4. Similar moderate right pleural effusion. Electronically  Signed   By: Abigail Miyamoto M.D.   On: 05/10/2016 09:14    ELIGIBLE FOR AVAILABLE RESEARCH PROTOCOL: no  ASSESSMENT: 83 y.o. [place] woman residing at [facility] [place]  (1) status post right lumpectomy on 10/27/2004 for an invasive lobular carcinoma, grade  1, estrogen and progesterone receptor positive, Ki-67 3%, HER-2 negative, and so T2N2, treated with radiation and tamoxifen for 5 years  (a) Also status post left lumpectomy for ductal carcinoma in situ  METASTATIC DISEASE: November 2017 (2) chest CT scan and PET scan obtained October 2017 show a hot 1.2 cm right paratracheal lymph node, a rim of activity along the pleural surface of the a zygo-esophageal region, and bilateral adrenal metastases, left greater than right   (a) biopsy of the left adrenal gland 12/03/2015 shows metastatic carcinoma, estrogen and progesterone receptor positive, HER-2 nonamplified  (3) tamoxifen started 01/20/2015, discontinued March 2018 with concerns regarding progression  (4) anastrozole started March 2018  (a) Xgeva started 03/29/2016, to be repeated monthly, held after one does because of hypocalcemia  PLAN: I spent approximately 30 minutes with Cassandra Glenn with most of that time spent discussing her complex situation. She is tolerating anastrozole well and it appears to be working for her. The PET scan is generally favorable. The alkaline phosphatase decrease is also a good sign. We have a repeat CA 27-29 pending from today.  We discussed the possibility of adding fulvestrant, but I think we can hold off at this time.  She has been unable to receive further doses of denosumab because of hypocalcemia. We discussed this at length today. She will increase her vit D intake and we will resume denosumab when the calcium level normalizes.  I upped her opeprazole to 40 mg/day given her continuing issues with reflux symptoms. We also discussed management of dry mouth problems  She wil see me again in about 3 months. We will repeat a PET likely in 6. We will continue to follow her markers and labs for indication of response.  She knows tocall for any other problem that may develop before the next visit  Chauncey Cruel, MD   05/24/2016 9:50 PM Medical Oncology and  Hematology [facility] [place], [place] [address] Tel. [phone]    Fax. [phone]

## 2016-05-25 ENCOUNTER — Encounter: Payer: Self-pay | Admitting: Internal Medicine

## 2016-05-25 LAB — CANCER ANTIGEN 27.29: CA 27.29: 157.2 U/mL — ABNORMAL HIGH (ref 0.0–38.6)

## 2016-06-04 ENCOUNTER — Non-Acute Institutional Stay: Payer: Medicare Other | Admitting: Nurse Practitioner

## 2016-06-04 ENCOUNTER — Encounter: Payer: Self-pay | Admitting: Nurse Practitioner

## 2016-06-04 DIAGNOSIS — R110 Nausea: Secondary | ICD-10-CM

## 2016-06-04 DIAGNOSIS — E871 Hypo-osmolality and hyponatremia: Secondary | ICD-10-CM | POA: Diagnosis not present

## 2016-06-04 DIAGNOSIS — M1611 Unilateral primary osteoarthritis, right hip: Secondary | ICD-10-CM | POA: Diagnosis not present

## 2016-06-04 DIAGNOSIS — R609 Edema, unspecified: Secondary | ICD-10-CM | POA: Diagnosis not present

## 2016-06-04 DIAGNOSIS — K5900 Constipation, unspecified: Secondary | ICD-10-CM | POA: Diagnosis not present

## 2016-06-04 DIAGNOSIS — J189 Pneumonia, unspecified organism: Secondary | ICD-10-CM

## 2016-06-04 DIAGNOSIS — K219 Gastro-esophageal reflux disease without esophagitis: Secondary | ICD-10-CM | POA: Diagnosis not present

## 2016-06-04 DIAGNOSIS — F3341 Major depressive disorder, recurrent, in partial remission: Secondary | ICD-10-CM

## 2016-06-04 DIAGNOSIS — J181 Lobar pneumonia, unspecified organism: Secondary | ICD-10-CM | POA: Diagnosis not present

## 2016-06-04 DIAGNOSIS — I10 Essential (primary) hypertension: Secondary | ICD-10-CM | POA: Diagnosis not present

## 2016-06-04 DIAGNOSIS — R05 Cough: Secondary | ICD-10-CM | POA: Diagnosis not present

## 2016-06-04 DIAGNOSIS — D638 Anemia in other chronic diseases classified elsewhere: Secondary | ICD-10-CM

## 2016-06-04 DIAGNOSIS — G4700 Insomnia, unspecified: Secondary | ICD-10-CM | POA: Diagnosis not present

## 2016-06-04 NOTE — Assessment & Plan Note (Signed)
Pain is managed with Tramadol ac and hs, prn Tylenol.

## 2016-06-04 NOTE — Assessment & Plan Note (Signed)
trace edema BLE, chronic, monitor weight, continue Furosemide 40mg  daily

## 2016-06-04 NOTE — Assessment & Plan Note (Signed)
Her mood is managed with Alprazolam qam, bid prn, Mirtazapine 7.5mg  daily.

## 2016-06-04 NOTE — Assessment & Plan Note (Signed)
Takes Zolpidem 5mg  prn at night for sleep

## 2016-06-04 NOTE — Assessment & Plan Note (Signed)
Normalized, continue Furosemide 40mg  daily, 05/24/16 Na 137, K 4.1, Bun 49m, creat 1.0, wbc 7.2, Hgb 11.5, plt 235

## 2016-06-04 NOTE — Assessment & Plan Note (Signed)
Better, continue  Zofran 4mg  qam and prn, , Mirtazapine 7.5mg  qhs, Omeprazole 20mg  daily.

## 2016-06-04 NOTE — Assessment & Plan Note (Addendum)
06/04/16 CXR right basilar opacity may represent acute infectious process, will tx 7 day course of Augmentin 875mg  bid and medrol dose pk.

## 2016-06-04 NOTE — Progress Notes (Signed)
Location:  [facility] Room Number: 8 Place of Service:  ALF [phone]) Provider:  Mast, Manxie  NP  Estill Dooms, MD  Patient Care Team: Estill Dooms, MD as PCP - General (Internal Medicine) Melina Modena, [facility] Mast, Man X, NP as Nurse Practitioner (Nurse Practitioner) Shon Hough, MD as Consulting Physician (Ophthalmology) Molli Posey, MD as Consulting Physician (Obstetrics and Gynecology) Gerarda Fraction, MD as Consulting Physician (Ophthalmology) Lorelle Gibbs, MD as Consulting Physician (Radiology) Melrose Nakayama, MD as Consulting Physician (Orthopedic Surgery) Gerarda Fraction, MD as Referring Physician (Ophthalmology) Ardis Hughs, MD as Attending Physician (Urology) Magrinat, Virgie Dad, MD as Consulting Physician (Oncology)  Extended Emergency Contact Information Primary Emergency Contact: Black,Emily Address: [address]          [place], [place] [address] [name] of [place] Phone: [phone] Mobile Phone: [phone] Relation: Daughter Secondary Emergency Contact: Marcine Matar [place] of [place] Mobile Phone: [phone] Relation: Daughter  Code Status:  DNR Goals of care: Advanced Directive information Advanced Directives 06/04/2016  Does Patient Have a Medical Advance Directive? Yes  Type of Advance Directive Out of facility DNR (pink MOST or yellow form);[place];Living will  Does patient want to make changes to medical advance directive? No - Patient declined  Copy of [place] in Chart? Yes  Would patient like information on creating a medical advance directive? -  Pre-existing out of facility DNR order (yellow form or pink MOST form) Yellow form placed in chart (order not valid for inpatient use)     Chief Complaint  Patient presents with  . Acute Visit    pneumonia    HPI:  Pt is a 83 y.o. female seen today for an acute visit for    Past  Medical History:  Diagnosis Date  . Abnormal PET scan of mediastinum 11/11/2015  . Abnormalities of the hair 02/29/2012  . Arthritis   . BPV (benign positional vertigo)   . Cancer ([place])   . Cancer antigen 125 (CA 125) elevation 11/27/2013  . Candidiasis of skin and nails 03/19/2011  . 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 Chorioretinal scar, macular 01/23/2015  . Closed fracture of base of neck of femur ([place]) 03/18/2011  . Compression fracture of thoracic vertebra ([facility]) 11/24/2012  . Contact dermatitis and other eczema, due to unspecified cause 05/07/2011  . Depression 07/23/2014  . Diverticulosis of colon (without mention of hemorrhage) 03/19/2011  . Edema 05/07/2011  . HOH (hard of hearing)   . Hypertension 02/24/2016  . Hypopotassemia 03/26/2011  . Hyposmolality and/or hyponatremia 03/19/2011  . Hypotension, unspecified 03/18/2011  . IBS (irritable bowel syndrome) 11/27/2013  . Idiopathic scoliosis 01/27/2016  . Impaired fasting glucose 03/19/2011  . Insomnia, unspecified 03/19/2011  . Kidney infection   . Lumbago 03/19/2011  . Macular degeneration   . Malignant neoplasm of breast (female), unspecified site 10/28/2002  . Mass of right lung 10/23/2015  . Metastasis to adrenal gland ([place]) 11/21/2015   Breast cancer  . Metastatic breast cancer ([facility]) 10/27/2004  . Nonexudative age-related macular degeneration 12/10/2010  . Osteoporosis 08/06/2014  . Osteoporosis, unspecified 03/19/2011  . Other malaise and fatigue 03/19/2011  . Pain in joint, pelvic region and thigh 03/19/2011  . Primary osteoarthritis of right hip 05/28/2014   TOTAL HIP ARTHROPLASTY ANTERIOR APPROACH HARDWARE REMOVAL on 05/28/2014   . Purpura senilis ([facility]) 07/31/2013  . Radiculopathy, cervical 03/23/2016   Right C6  . Rib fractures   . Right bundle  branch block 08/30/2011  . Shortness of breath   . Spasm of muscle 03/19/2011  . Spinal stenosis, lumbar region, with neurogenic claudication 11/02/2011  . TMJ click  2/45/8099  . Trigger finger (acquired) 11/02/2011  . Unspecified constipation 03/19/2011  . Urinary frequency 04/24/2013  . UTI (urinary tract infection) 04/16/2013   04/16/13 pseudomonas aeruginosa: tx with Cipro 06/15/14 P. Aeruginosa [name] 500mg  IM q12h x 7 days.     . Vaginitis and vulvovaginitis 02/29/2012  . Vertigo 08/26/2015   Past Surgical History:  Procedure Laterality Date  . BREAST SURGERY Bilateral 2005-10/27/2004   lumpectomy- Streck,MD  . CATARACT EXTRACTION  2010   bialteral  . EYE SURGERY     cataract  . FRACTURE SURGERY Left 09/1980   ankle  . HARDWARE REMOVAL Right 05/28/2014   Procedure: HARDWARE REMOVAL;  Surgeon: Melrose Nakayama, MD;  Location: [facility];  Service: Orthopedics;  Laterality: Right;  . HIP PINNING,CANNULATED  03/08/2011   Procedure: CANNULATED HIP PINNING;  Surgeon: Johnn Hai, MD;  Location: [facility];  Service: Orthopedics;  Laterality: Right;  . ORIF HIP FRACTURE Right 03/08/2011   Bean,MD  . SKIN CANCER EXCISION Left 10/12/2012   lower leg Dr. [place]  . SKIN LESION EXCISION Right 02/04/2011   Abdomen lesion spongiotic dermatitis-Taffeen, MD  . SQUAMOUS CELL CARCINOMA EXCISION Right 02/04/2011   forearmSyble Creek, MD  . TONSILLECTOMY    . TOTAL HIP ARTHROPLASTY Right 05/28/2014   Procedure: TOTAL HIP ARTHROPLASTY ANTERIOR APPROACH;  Surgeon: Melrose Nakayama, MD;  Location: [facility];  Service: Orthopedics;  Laterality: Right;    Allergies  Allergen Reactions  . Ciprofloxacin Other (See Comments)    Reaction:  Unknown   . Macrodantin [Nitrofurantoin] Other (See Comments)    Reaction:  Unknown   . Trimethoprim Other (See Comments)    Reaction:  Unknown   . Bactrim [Sulfamethoxazole-Trimethoprim] Other (See Comments)    Reaction:  Unknown     Outpatient Encounter Prescriptions as of 06/04/2016  Medication Sig  . acetaminophen (TYLENOL) 500 MG tablet Take 1,000 mg by mouth every 6 (six) hours as needed for moderate pain.   Marland Kitchen ALPRAZolam (XANAX) 0.5 MG  tablet Take 0.5 mg by mouth daily. Pt is also able to use twice daily as needed for anxiety.  Marland Kitchen anastrozole (ARIMIDEX) 1 MG tablet Take 1 tablet (1 mg total) by mouth daily.  Marland Kitchen antiseptic oral rinse (BIOTENE) LIQD 15 mLs by Mouth Rinse route 2 (two) times daily as needed for dry mouth.   Marland Kitchen aspirin 81 MG chewable tablet Chew 81 mg by mouth daily.   . Bilberry 150 MG CAPS Take 150 mg by mouth daily.  . Calcium Carbonate-Vitamin D (CALCIUM 600+D) 600-400 MG-UNIT tablet Take 1 tablet by mouth 2 (two) times daily.  . Cranberry-Cholecalciferol (SUPER CRANBERRY/VITAMIN D3) 4200-500 MG-UNIT CAPS Take 1 capsule by mouth daily.  . furosemide (LASIX) 40 MG tablet Take 40 mg by mouth daily.   . Glucosamine-Chondroitin (GLUCOSAMINE CHONDR COMPLEX PO) Take 1 tablet by mouth daily. for joints  . Lactobacillus-Inulin (CULTURELLE DIGESTIVE HEALTH PO) Take 1 tablet by mouth daily.  . meclizine (ANTIVERT) 25 MG tablet Take 1 tablet (25 mg total) by mouth 3 (three) times daily as needed for dizziness.  . Meth-Hyo-M Bl-Na Phos-Ph Sal (URIBEL) 118 MG CAPS One up to 3 times daily as needed for dysuria (Patient taking differently: Take 118 mg by mouth 3 (three) times daily as needed (for dysuria). )  . mirtazapine (REMERON) 7.5 MG tablet Take  7.5 mg by mouth at bedtime.  . Multiple Vitamins-Minerals (PRESERVISION/LUTEIN) CAPS Take 1 capsule by mouth 2 (two) times daily.  Marland Kitchen omeprazole (PRILOSEC) 20 MG capsule Take 40 mg by mouth daily before breakfast.   . ondansetron (ZOFRAN-ODT) 4 MG disintegrating tablet Take 4 mg by mouth every 8 (eight) hours as needed for nausea or vomiting.  . polycarbophil (FIBERCON) 625 MG tablet Take 625 mg by mouth 2 (two) times daily.  . polyethylene glycol (MIRALAX / GLYCOLAX) packet Take 17 g by mouth at bedtime. Pt also uses once daily as needed for constipation.  . traMADol (ULTRAM) 50 MG tablet Take 50 mg by mouth 4 (four) times daily -  with meals and at bedtime.   Marland Kitchen trimethoprim  (TRIMPEX) 100 MG tablet Take 100 mg by mouth at bedtime.  Marland Kitchen zolpidem (AMBIEN) 5 MG tablet Take 5 mg by mouth at bedtime as needed for sleep.  . [DISCONTINUED] cholecalciferol (VITAMIN D) 400 UNITS TABS Take 400 Units by mouth daily.   . [DISCONTINUED] guaiFENesin (MUCINEX) 600 MG 12 hr tablet Take 600 mg by mouth 2 (two) times daily as needed for cough.   . [DISCONTINUED] ibuprofen (ADVIL,MOTRIN) 200 MG tablet Take 400 mg by mouth every 8 (eight) hours as needed for mild pain.   . [DISCONTINUED] nystatin (MYCOSTATIN/NYSTOP) powder Apply to affected skin daily to treat yeast (Patient taking differently: Apply 2 g topically daily. )  . [DISCONTINUED] omega-3 acid ethyl esters (LOVAZA) 1 g capsule Take 1 g by mouth daily.  . [DISCONTINUED] ondansetron (ZOFRAN) 4 MG tablet Take 4 mg by mouth daily.   No facility-administered encounter medications on file as of 06/04/2016.     Review of Systems  Immunization History  Administered Date(s) Administered  . Influenza Whole 10/12/2011, 10/12/2012  . Influenza-Unspecified 10/25/2013, 10/10/2014, 12/12/2015  . PPD Test 07/07/2010  . Pneumococcal Conjugate-13 01/12/2003  . Zoster 01/12/2007   Pertinent  Health Maintenance Due  Topic Date Due  . PNA vac Low Risk Adult (2 of 2 - PPSV23) 01/12/2004  . INFLUENZA VACCINE  08/11/2016  . MAMMOGRAM  08/11/2016  . DEXA SCAN  Completed   Fall Risk  11/11/2015 10/16/2015 07/03/2015 01/28/2015 10/22/2014  Falls in the past year? Yes No No No No  Number falls in past yr: 1 - - - -  Injury with Fall? Yes - - - -  Risk Factor Category  High Fall Risk - - - -  Risk for fall due to : History of fall(s);Impaired balance/gait;Impaired mobility - - - -  Follow up Falls evaluation completed - - - -   Functional Status Survey:    Vitals:   06/04/16 1402  BP: 124/60  Pulse: 85  Resp: 17  Temp: 97.5 F (36.4 C)  Weight: 132 lb 9.6 oz (60.1 kg)  Height: 5\' 1"  (1.549 m)   Body mass index is 25.05  kg/m. Physical Exam  Labs reviewed:  Recent Labs  01/19/16 0800  02/18/16 1204  04/26/16 1412 05/20/16 1658 05/24/16 1504  NA 134*  < > 129*  < > 135* 134* 137  K 4.5  < > 4.2  < > 4.5 4.1 4.1  CL 98  --  98*  --   --  100*  --   CO2 28  < > 23  < > 27 28 27   GLUCOSE 107*  < > 108*  < > 125 111* 101  BUN 15  < > 13  < > 11.3 15 15.3  CREATININE 0.81  < > 0.71  < > 0.9 0.81 1.0  CALCIUM 9.5  < > 8.9  < > 7.7* 7.6* 8.5  < > = values in this interval not displayed.  Recent Labs  04/26/16 1412 05/20/16 1658 05/24/16 1504  AST 36* 39 36*  ALT 25 21 22   ALKPHOS 264* 137* 154*  BILITOT 0.37 0.3 0.36  PROT 6.2* 5.9* 6.1*  ALBUMIN 3.4* 3.2* 3.2*    Recent Labs  04/26/16 1411 05/20/16 1658 05/24/16 1504  WBC 6.5 6.8 7.2  NEUTROABS 3.3 3.1 3.3  HGB 11.3* 10.9* 11.5*  HCT 34.6* 34.1* 36.0  MCV 92.4 91.9 92.5  PLT 230 235 235   Lab Results  Component Value Date   TSH 3.215 08/25/2015   Lab Results  Component Value Date   HGBA1C 5.9 01/23/2015   No results found for: CHOL, HDL, LDLCALC, LDLDIRECT, TRIG, CHOLHDL  Significant Diagnostic Results in last 30 days:  Nm Pet Image Restag (ps) Skull Base To Thigh  Result Date: 05/10/2016 CLINICAL DATA:  Subsequent treatment strategy for restaging have right breast cancer. Left adrenal metastasis. EXAM: NUCLEAR MEDICINE PET SKULL BASE TO THIGH TECHNIQUE: 8.3 mCi F-18 FDG was injected intravenously. Full-ring PET imaging was performed from the skull base to thigh after the radiotracer. CT data was obtained and used for attenuation correction and anatomic localization. FASTING BLOOD GLUCOSE:  Value: 104 mg/dl COMPARISON:  03/16/2016 FINDINGS: NECK No cervical nodal hypermetabolism. Carotid atherosclerosis. No cervical adenopathy. CHEST Right paratracheal node measures 8 mm and a S.U.V. max of 3.3 on image 56/series 4. Compare 9 mm and a S.U.V. max of 4.2 on the prior. Subcarinal node measures 8 mm and a S.U.V. max of 5.6 versus  similar in size and a S.U.V. max of 4.4 on the prior exam. Medial right lung base nodularity is less well-defined today. Measures a S.U.V. max of 2.2 on image 52/series 8. Compare a S.U.V. max of 3.7 on the prior exam. An area of more cephalad anterior right upper or right middle lobe Mild hypermetabolic pulmonary opacity is favored to represent scarring, including on image 30/series 8. Bilateral areas of chronic consolidation which are likely due to post infectious or inflammatory scarring and possible radiation fibrosis. Moderate right pleural effusion is similar. ABDOMEN/PELVIS Left adrenal enlargement again identified. This measures 1.6 x 1.2 cm and a S.U.V. max of 7.1 on image 96/series 4. Compare 1.8 x 1.1 cm and a S.U.V. max of 6.0 on the prior exam. Right adrenal hypermetabolism measures a S.U.V. max of 4.9 today versus a S.U.V. max of 4.3 on the prior. No abdominopelvic nodal hypermetabolism. Degraded evaluation of the pelvis, secondary to beam hardening artifact from right hip arthroplasty. Suspect trace right pelvic fluid, including on image 147/ series 4. Unchanged. Colonic stool burden suggests constipation. SKELETON Multifocal rib hypermetabolism, primarily corresponding to chronic fractures. Example in the left first rib measuring a S.U.V. max of 3.7 today versus a S.U.V. max of 5.5 on the prior. Right hip arthroplasty. Relatively diffuse heterogeneous sclerosis is similar. Left greater than right rib fractures, many of which have undergone interval healing. IMPRESSION: 1. Overall similar disease burden compared to the prior PET. 2. Some lesions are mildly increased in hypermetabolism, while others are similar to improved. 3. No new sites of disease identified. 4. Similar moderate right pleural effusion. Electronically Signed   By: Abigail Miyamoto M.D.   On: 05/10/2016 09:14    Assessment/Plan 1. Hypertension, unspecified type   2. Constipation, unspecified constipation  type   3.  Gastroesophageal reflux disease, esophagitis presence not specified   4. Primary osteoarthritis of right hip   5. Anemia, chronic disease   6. Insomnia, unspecified type   7. Hyponatremia   8. Recurrent major depressive disorder, in partial remission ([place])   9. Edema, unspecified type   10. Nausea     Family/ staff Communication:   Labs/tests ordered:

## 2016-06-04 NOTE — Assessment & Plan Note (Signed)
05/24/16 Hgb 11.5

## 2016-06-04 NOTE — Assessment & Plan Note (Signed)
Improved, continue Omeprazole, Zofran, Mirtazapine, Xanax

## 2016-06-04 NOTE — Assessment & Plan Note (Signed)
Stable, continue MiraLax daily, Fibercon bid.

## 2016-06-04 NOTE — Assessment & Plan Note (Signed)
05/24/16 Na 137

## 2016-06-04 NOTE — Progress Notes (Signed)
Location:  [facility] Room Number: 8 Place of Service:  ALF [phone]) Provider:  Rynlee Lisbon, Man[place]  NP  Estill Dooms, MD  Patient Care Team: Estill Dooms, MD as PCP - General (Internal Medicine) Melina Modena, [facility] Shanan Mcmiller X, NP as Nurse Practitioner (Nurse Practitioner) Shon Hough, MD as Consulting Physician (Ophthalmology) Molli Posey, MD as Consulting Physician (Obstetrics and Gynecology) Gerarda Fraction, MD as Consulting Physician (Ophthalmology) Lorelle Gibbs, MD as Consulting Physician (Radiology) Melrose Nakayama, MD as Consulting Physician (Orthopedic Surgery) Gerarda Fraction, MD as Referring Physician (Ophthalmology) Ardis Hughs, MD as Attending Physician (Urology) Magrinat, Virgie Dad, MD as Consulting Physician (Oncology)  Extended Emergency Contact Information Primary Emergency Contact: Black,Emily Address: [address]          [place], [place] [address] [name] of [place] Phone: [phone] Mobile Phone: [phone] Relation: Daughter Secondary Emergency Contact: Marcine Matar [place] of [place] Mobile Phone: [phone] Relation: Daughter  Code Status:  DNR Goals of care: Advanced Directive information Advanced Directives 06/04/2016  Does Patient Have a Medical Advance Directive? Yes  Type of Advance Directive Out of facility DNR (pink MOST or yellow form);[place];Living will  Does patient want to make changes to medical advance directive? No - Patient declined  Copy of [place] in Chart? Yes  Would patient like information on creating a medical advance directive? -  Pre-existing out of facility DNR order (yellow form or pink MOST form) Yellow form placed in chart (order not valid for inpatient use)     Chief Complaint  Patient presents with  . Acute Visit    pneumonia    HPI:  Pt is a 83 y.o. female seen today for an acute visit for congestive cough,  afebrile, no O2 desaturation, denied chest pain of palpitation    06/04/16 CXR right basilar opacity may represent acute infectious process, will tx 10 day course of Levaquin 500mg  qd and medrol dose pk.     X-ray 04/02/16 L ribs, L spine, T spine showed acute mildly displaced left first to tenth rib fractures. T11 vertebral body 80% reduction in height. Taking Tramadol tid for pain  Hx of constipation, taking MiraLax daily, takes TMP nightly for UTI suppression, she takes Xanax 0.5mg , Mirtazapine for anxiety, prn Ambien for sleep. She takes Tramadol ac and hs, prn Tylenol. BLE edema, stable, taking Furosemide 40mg  qd Past Medical History:  Diagnosis Date  . Abnormal PET scan of mediastinum 11/11/2015  . Abnormalities of the hair 02/29/2012  . Arthritis   . BPV (benign positional vertigo)   . Cancer ([place])   . Cancer antigen 125 (CA 125) elevation 11/27/2013  . Candidiasis of skin and nails 03/19/2011  . 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 Chorioretinal scar, macular 01/23/2015  . Closed fracture of base of neck of femur ([place]) 03/18/2011  . Compression fracture of thoracic vertebra ([facility]) 11/24/2012  . Contact dermatitis and other eczema, due to unspecified cause 05/07/2011  . Depression 07/23/2014  . Diverticulosis of colon (without mention of hemorrhage) 03/19/2011  . Edema 05/07/2011  . HOH (hard of hearing)   . Hypertension 02/24/2016  . Hypopotassemia 03/26/2011  . Hyposmolality and/or hyponatremia 03/19/2011  . Hypotension, unspecified 03/18/2011  . IBS (irritable bowel syndrome) 11/27/2013  . Idiopathic scoliosis 01/27/2016  . Impaired fasting glucose 03/19/2011  . Insomnia, unspecified 03/19/2011  . Kidney infection   . Lumbago 03/19/2011  . Macular degeneration   . Malignant neoplasm of breast (  female), unspecified site 10/28/2002  . Mass of right lung 10/23/2015  . Metastasis to adrenal gland ([place]) 11/21/2015   Breast cancer  . Metastatic breast cancer ([place])  10/27/2004  . Nonexudative age-related macular degeneration 12/10/2010  . Osteoporosis 08/06/2014  . Osteoporosis, unspecified 03/19/2011  . Other malaise and fatigue 03/19/2011  . Pain in joint, pelvic region and thigh 03/19/2011  . Primary osteoarthritis of right hip 05/28/2014   TOTAL HIP ARTHROPLASTY ANTERIOR APPROACH HARDWARE REMOVAL on 05/28/2014   . Purpura senilis ([place]) 07/31/2013  . Radiculopathy, cervical 03/23/2016   Right C6  . Rib fractures   . Right bundle branch block 08/30/2011  . Shortness of breath   . Spasm of muscle 03/19/2011  . Spinal stenosis, lumbar region, with neurogenic claudication 11/02/2011  . TMJ click 7/85/8850  . Trigger finger (acquired) 11/02/2011  . Unspecified constipation 03/19/2011  . Urinary frequency 04/24/2013  . UTI (urinary tract infection) 04/16/2013   04/16/13 pseudomonas aeruginosa: tx with Cipro 06/15/14 P. Aeruginosa [name] 500mg  IM q12h x 7 days.     . Vaginitis and vulvovaginitis 02/29/2012  . Vertigo 08/26/2015   Past Surgical History:  Procedure Laterality Date  . BREAST SURGERY Bilateral 2005-10/27/2004   lumpectomy- Streck,MD  . CATARACT EXTRACTION  2010   bialteral  . EYE SURGERY     cataract  . FRACTURE SURGERY Left 09/1980   ankle  . HARDWARE REMOVAL Right 05/28/2014   Procedure: HARDWARE REMOVAL;  Surgeon: Melrose Nakayama, MD;  Location: [facility];  Service: Orthopedics;  Laterality: Right;  . HIP PINNING,CANNULATED  03/08/2011   Procedure: CANNULATED HIP PINNING;  Surgeon: Johnn Hai, MD;  Location: [facility];  Service: Orthopedics;  Laterality: Right;  . ORIF HIP FRACTURE Right 03/08/2011   Bean,MD  . SKIN CANCER EXCISION Left 10/12/2012   lower leg Dr. Syble Creek  . SKIN LESION EXCISION Right 02/04/2011   Abdomen lesion spongiotic dermatitis-Taffeen, MD  . SQUAMOUS CELL CARCINOMA EXCISION Right 02/04/2011   forearmSyble Creek, MD  . TONSILLECTOMY    . TOTAL HIP ARTHROPLASTY Right 05/28/2014   Procedure: TOTAL HIP ARTHROPLASTY  ANTERIOR APPROACH;  Surgeon: Melrose Nakayama, MD;  Location: [facility];  Service: Orthopedics;  Laterality: Right;    Allergies  Allergen Reactions  . Ciprofloxacin Other (See Comments)    Reaction:  Unknown   . Macrodantin [Nitrofurantoin] Other (See Comments)    Reaction:  Unknown   . Trimethoprim Other (See Comments)    Reaction:  Unknown   . Bactrim [Sulfamethoxazole-Trimethoprim] Other (See Comments)    Reaction:  Unknown     Outpatient Encounter Prescriptions as of 06/04/2016  Medication Sig  . acetaminophen (TYLENOL) 500 MG tablet Take 1,000 mg by mouth every 6 (six) hours as needed for moderate pain.   Marland Kitchen ALPRAZolam (XANAX) 0.5 MG tablet Take 0.5 mg by mouth daily. Pt is also able to use twice daily as needed for anxiety.  Marland Kitchen anastrozole (ARIMIDEX) 1 MG tablet Take 1 tablet (1 mg total) by mouth daily.  Marland Kitchen antiseptic oral rinse (BIOTENE) LIQD 15 mLs by Mouth Rinse route 2 (two) times daily as needed for dry mouth.   Marland Kitchen aspirin 81 MG chewable tablet Chew 81 mg by mouth daily.   . Bilberry 150 MG CAPS Take 150 mg by mouth daily.  . Calcium Carbonate-Vitamin D (CALCIUM 600+D) 600-400 MG-UNIT tablet Take 1 tablet by mouth 2 (two) times daily.  . Cranberry-Cholecalciferol (SUPER CRANBERRY/VITAMIN D3) 4200-500 MG-UNIT CAPS Take 1 capsule by mouth daily.  . furosemide (  LASIX) 40 MG tablet Take 40 mg by mouth daily.   . Glucosamine-Chondroitin (GLUCOSAMINE CHONDR COMPLEX PO) Take 1 tablet by mouth daily. for joints  . Lactobacillus-Inulin (CULTURELLE DIGESTIVE HEALTH PO) Take 1 tablet by mouth daily.  . meclizine (ANTIVERT) 25 MG tablet Take 1 tablet (25 mg total) by mouth 3 (three) times daily as needed for dizziness.  . Meth-Hyo-M Bl-Na Phos-Ph Sal (URIBEL) 118 MG CAPS One up to 3 times daily as needed for dysuria (Patient taking differently: Take 118 mg by mouth 3 (three) times daily as needed (for dysuria). )  . mirtazapine (REMERON) 7.5 MG tablet Take 7.5 mg by mouth at bedtime.  . Multiple  Vitamins-Minerals (PRESERVISION/LUTEIN) CAPS Take 1 capsule by mouth 2 (two) times daily.  Marland Kitchen omeprazole (PRILOSEC) 20 MG capsule Take 40 mg by mouth daily before breakfast.   . ondansetron (ZOFRAN-ODT) 4 MG disintegrating tablet Take 4 mg by mouth every 8 (eight) hours as needed for nausea or vomiting.  . polycarbophil (FIBERCON) 625 MG tablet Take 625 mg by mouth 2 (two) times daily.  . polyethylene glycol (MIRALAX / GLYCOLAX) packet Take 17 g by mouth at bedtime. Pt also uses once daily as needed for constipation.  . traMADol (ULTRAM) 50 MG tablet Take 50 mg by mouth 4 (four) times daily -  with meals and at bedtime.   Marland Kitchen trimethoprim (TRIMPEX) 100 MG tablet Take 100 mg by mouth at bedtime.  Marland Kitchen zolpidem (AMBIEN) 5 MG tablet Take 5 mg by mouth at bedtime as needed for sleep.  . [DISCONTINUED] cholecalciferol (VITAMIN D) 400 UNITS TABS Take 400 Units by mouth daily.   . [DISCONTINUED] guaiFENesin (MUCINEX) 600 MG 12 hr tablet Take 600 mg by mouth 2 (two) times daily as needed for cough.   . [DISCONTINUED] ibuprofen (ADVIL,MOTRIN) 200 MG tablet Take 400 mg by mouth every 8 (eight) hours as needed for mild pain.   . [DISCONTINUED] nystatin (MYCOSTATIN/NYSTOP) powder Apply to affected skin daily to treat yeast (Patient taking differently: Apply 2 g topically daily. )  . [DISCONTINUED] omega-3 acid ethyl esters (LOVAZA) 1 g capsule Take 1 g by mouth daily.  . [DISCONTINUED] ondansetron (ZOFRAN) 4 MG tablet Take 4 mg by mouth daily.   No facility-administered encounter medications on file as of 06/04/2016.     Review of Systems  Constitutional: Positive for activity change, appetite change and fatigue. Negative for fever and unexpected weight change.  HENT: Positive for congestion and hearing loss (bilateral earing aides). Negative for ear pain.   Eyes:       History of loss of visual acuity and macular degeneration.  Respiratory: Positive for cough. Negative for shortness of breath.        History  of breast discomfort. History of removal of a lump of cancer from the right breast. Dyspnea on exertion.  Cardiovascular: Positive for leg swelling (tight, shiny skin). Negative for chest pain and palpitations.  Gastrointestinal: Positive for nausea (improved on Zofran prior to each meal).       She frequently complains that her intestines are not working right. There is a previous history of diverticulosis. Her last colonoscopy was in 2008. She has been seen by Dr. Paulita Fujita in the past. Constipation - she associates with Tramadol  Genitourinary: Positive for dysuria and frequency. Negative for decreased urine volume, difficulty urinating, vaginal bleeding and vaginal discharge.       Complains of a slow, weak urinary stream. Denies dysuria. Has increased frequency and nocturia. Saw Dr. Louis Meckel,  urologist. She was taught self-catheterization for a bladder that does not empty well. Vaginal discomfort. Managed by Dr. Claudean Kinds. History of elevated CEA 125. Normal ultrasound of the abdomen and genitourinary tract. Recurrent UTI.  Musculoskeletal: Positive for back pain and gait problem (unstable. using walker.).       Chronic back pain.  Patient has a shorter right leg and has a lift in the right shoe. Now seeing Dr. Rhona Raider.  Right hip arthroplasty 5/16 There is generalized stiffness in the joints. There is a history of spinal stenosis. Left third finger catches on extension sometimes. There is a trigger finger. Pain at the left iliac crest and left anterior ribs near the sternum. Left rib cage mild pain with deep breath and truncal movement.   Skin:       History of skin discoloration and changes of nails.  [place] in late Oct 2017 and contused the left side of the back Left side bruise.  Allergic/Immunologic: Negative.   Neurological: Negative for dizziness, tremors, seizures and numbness.       History of spinal stenosis with neurogenic claudication affecting the right leg. Complains of pain in  the buttocks that is suggestive of neuralgia from her spinal stenosis. Hospitalized for 24 hours 08/24/2015 for vertigo.  Hematological: Bruises/bleeds easily.  Psychiatric/Behavioral: Positive for dysphoric mood.    Immunization History  Administered Date(s) Administered  . Influenza Whole 10/12/2011, 10/12/2012  . Influenza-Unspecified 10/25/2013, 10/10/2014, 12/12/2015  . PPD Test 07/07/2010  . Pneumococcal Conjugate-13 01/12/2003  . Zoster 01/12/2007   Pertinent  Health Maintenance Due  Topic Date Due  . PNA vac Low Risk Adult (2 of 2 - PPSV23) 01/12/2004  . INFLUENZA VACCINE  08/11/2016  . MAMMOGRAM  08/11/2016  . DEXA SCAN  Completed   Fall Risk  11/11/2015 10/16/2015 07/03/2015 01/28/2015 10/22/2014  Falls in the past year? Yes No No No No  Number falls in past yr: 1 - - - -  Injury with Fall? Yes - - - -  Risk Factor Category  High Fall Risk - - - -  Risk for fall due to : History of fall(s);Impaired balance/gait;Impaired mobility - - - -  Follow up Falls evaluation completed - - - -   Functional Status Survey:    Vitals:   06/04/16 1402  BP: 124/60  Pulse: 85  Resp: 17  Temp: 97.5 F (36.4 C)  Weight: 132 lb 9.6 oz (60.1 kg)  Height: 5\' 1"  (1.549 m)   Body mass index is 25.05 kg/m. Physical Exam  Constitutional: She is oriented to person, place, and time. She appears well-developed and well-nourished. No distress.  HENT:  HOH Nonocclusive wax in both EAC.  Eyes:  Corrective lenses.  Neck: No JVD present. No tracheal deviation present. No thyromegaly present.  Cardiovascular: Normal rate, regular rhythm and normal heart sounds.  Exam reveals no gallop and no friction rub.   No murmur heard. Pulses in RLE not palpable, has 2+ edema which could be obscuring pulses  Pulmonary/Chest: Effort normal. She has rales.  Posterior right lower lung decreased breath sound  Abdominal: Soft. Bowel sounds are normal. She exhibits no distension and no mass. There is no  tenderness.  Musculoskeletal: Normal range of motion. She exhibits edema (both lower legs and has tight, shiny skin. Painless) and tenderness (T12-L1 area).  Trace edema BLE.   Lymphadenopathy:    She has no cervical adenopathy.  Neurological: She is alert and oriented to person, place, and time. No cranial nerve deficit.  Mild memory loss  Skin: Skin is warm and dry. No pallor.  Psychiatric: Her behavior is normal. Thought content normal.    Labs reviewed:  Recent Labs  01/19/16 0800  02/18/16 1204  04/26/16 1412 05/20/16 1658 05/24/16 1504  NA 134*  < > 129*  < > 135* 134* 137  K 4.5  < > 4.2  < > 4.5 4.1 4.1  CL 98  --  98*  --   --  100*  --   CO2 28  < > 23  < > 27 28 27   GLUCOSE 107*  < > 108*  < > 125 111* 101  BUN 15  < > 13  < > 11.3 15 15.3  CREATININE 0.81  < > 0.71  < > 0.9 0.81 1.0  CALCIUM 9.5  < > 8.9  < > 7.7* 7.6* 8.5  < > = values in this interval not displayed.  Recent Labs  04/26/16 1412 05/20/16 1658 05/24/16 1504  AST 36* 39 36*  ALT 25 21 22   ALKPHOS 264* 137* 154*  BILITOT 0.37 0.3 0.36  PROT 6.2* 5.9* 6.1*  ALBUMIN 3.4* 3.2* 3.2*    Recent Labs  04/26/16 1411 05/20/16 1658 05/24/16 1504  WBC 6.5 6.8 7.2  NEUTROABS 3.3 3.1 3.3  HGB 11.3* 10.9* 11.5*  HCT 34.6* 34.1* 36.0  MCV 92.4 91.9 92.5  PLT 230 235 235   Lab Results  Component Value Date   TSH 3.215 08/25/2015   Lab Results  Component Value Date   HGBA1C 5.9 01/23/2015   No results found for: CHOL, HDL, LDLCALC, LDLDIRECT, TRIG, CHOLHDL  Significant Diagnostic Results in last 30 days:  Nm Pet Image Restag (ps) Skull Base To Thigh  Result Date: 05/10/2016 CLINICAL DATA:  Subsequent treatment strategy for restaging have right breast cancer. Left adrenal metastasis. EXAM: NUCLEAR MEDICINE PET SKULL BASE TO THIGH TECHNIQUE: 8.3 mCi F-18 FDG was injected intravenously. Full-ring PET imaging was performed from the skull base to thigh after the radiotracer. CT data was obtained  and used for attenuation correction and anatomic localization. FASTING BLOOD GLUCOSE:  Value: 104 mg/dl COMPARISON:  03/16/2016 FINDINGS: NECK No cervical nodal hypermetabolism. Carotid atherosclerosis. No cervical adenopathy. CHEST Right paratracheal node measures 8 mm and a S.U.V. max of 3.3 on image 56/series 4. Compare 9 mm and a S.U.V. max of 4.2 on the prior. Subcarinal node measures 8 mm and a S.U.V. max of 5.6 versus similar in size and a S.U.V. max of 4.4 on the prior exam. Medial right lung base nodularity is less well-defined today. Measures a S.U.V. max of 2.2 on image 52/series 8. Compare a S.U.V. max of 3.7 on the prior exam. An area of more cephalad anterior right upper or right middle lobe Mild hypermetabolic pulmonary opacity is favored to represent scarring, including on image 30/series 8. Bilateral areas of chronic consolidation which are likely due to post infectious or inflammatory scarring and possible radiation fibrosis. Moderate right pleural effusion is similar. ABDOMEN/PELVIS Left adrenal enlargement again identified. This measures 1.6 x 1.2 cm and a S.U.V. max of 7.1 on image 96/series 4. Compare 1.8 x 1.1 cm and a S.U.V. max of 6.0 on the prior exam. Right adrenal hypermetabolism measures a S.U.V. max of 4.9 today versus a S.U.V. max of 4.3 on the prior. No abdominopelvic nodal hypermetabolism. Degraded evaluation of the pelvis, secondary to beam hardening artifact from right hip arthroplasty. Suspect trace right pelvic fluid, including on image 147/ series 4.  Unchanged. Colonic stool burden suggests constipation. SKELETON Multifocal rib hypermetabolism, primarily corresponding to chronic fractures. Example in the left first rib measuring a S.U.V. max of 3.7 today versus a S.U.V. max of 5.5 on the prior. Right hip arthroplasty. Relatively diffuse heterogeneous sclerosis is similar. Left greater than right rib fractures, many of which have undergone interval healing. IMPRESSION: 1. Overall  similar disease burden compared to the prior PET. 2. Some lesions are mildly increased in hypermetabolism, while others are similar to improved. 3. No new sites of disease identified. 4. Similar moderate right pleural effusion. Electronically Signed   By: Abigail Miyamoto M.D.   On: 05/10/2016 09:14    Assessment/Plan Right lower lobe pneumonia ([place]) 06/04/16 CXR right basilar opacity may represent acute infectious process, will tx 7 day course of Augmentin 875mg  bid and medrol dose pk.    Hypertension Normalized, continue Furosemide 40mg  daily, 05/24/16 Na 137, K 4.1, Bun 63m, creat 1.0, wbc 7.2, Hgb 11.5, plt 235  Constipation Stable, continue MiraLax daily, Fibercon bid.    GERD (gastroesophageal reflux disease) Better, continue  Zofran 4mg  qam and prn, , Mirtazapine 7.5mg  qhs, Omeprazole 20mg  daily.   Primary osteoarthritis of right hip Pain is managed with Tramadol ac and hs, prn Tylenol.   Anemia, chronic disease 05/24/16 Hgb 11.5  Insomnia Takes Zolpidem 5mg  prn at night for sleep   Hyponatremia 05/24/16 Na 137  Depression Her mood is managed with Alprazolam qam, bid prn, Mirtazapine 7.5mg  daily.   Edema trace edema BLE, chronic, monitor weight, continue Furosemide 40mg  daily  Nausea Improved, continue Omeprazole, Zofran, Mirtazapine, Xanax     Family/ staff Communication: AL  Labs/tests ordered:  CXR done 06/04/16

## 2016-06-08 ENCOUNTER — Encounter: Payer: Self-pay | Admitting: Nurse Practitioner

## 2016-06-08 ENCOUNTER — Non-Acute Institutional Stay: Payer: Medicare Other | Admitting: Nurse Practitioner

## 2016-06-08 DIAGNOSIS — I10 Essential (primary) hypertension: Secondary | ICD-10-CM | POA: Diagnosis not present

## 2016-06-08 DIAGNOSIS — K219 Gastro-esophageal reflux disease without esophagitis: Secondary | ICD-10-CM

## 2016-06-08 DIAGNOSIS — R609 Edema, unspecified: Secondary | ICD-10-CM | POA: Diagnosis not present

## 2016-06-08 DIAGNOSIS — F3341 Major depressive disorder, recurrent, in partial remission: Secondary | ICD-10-CM | POA: Diagnosis not present

## 2016-06-08 DIAGNOSIS — L209 Atopic dermatitis, unspecified: Secondary | ICD-10-CM | POA: Diagnosis not present

## 2016-06-08 DIAGNOSIS — F419 Anxiety disorder, unspecified: Secondary | ICD-10-CM

## 2016-06-08 DIAGNOSIS — E871 Hypo-osmolality and hyponatremia: Secondary | ICD-10-CM

## 2016-06-08 DIAGNOSIS — G4700 Insomnia, unspecified: Secondary | ICD-10-CM | POA: Diagnosis not present

## 2016-06-08 DIAGNOSIS — D638 Anemia in other chronic diseases classified elsewhere: Secondary | ICD-10-CM

## 2016-06-08 DIAGNOSIS — J181 Lobar pneumonia, unspecified organism: Secondary | ICD-10-CM | POA: Diagnosis not present

## 2016-06-08 DIAGNOSIS — N390 Urinary tract infection, site not specified: Secondary | ICD-10-CM

## 2016-06-08 DIAGNOSIS — C7951 Secondary malignant neoplasm of bone: Secondary | ICD-10-CM

## 2016-06-08 DIAGNOSIS — K5900 Constipation, unspecified: Secondary | ICD-10-CM | POA: Diagnosis not present

## 2016-06-08 DIAGNOSIS — J189 Pneumonia, unspecified organism: Secondary | ICD-10-CM

## 2016-06-08 DIAGNOSIS — G893 Neoplasm related pain (acute) (chronic): Secondary | ICD-10-CM

## 2016-06-08 NOTE — Assessment & Plan Note (Signed)
Continue Trimethoprim for UTI suppression therapy.

## 2016-06-08 NOTE — Assessment & Plan Note (Signed)
Her mood is managed with Alprazolam qam, bid prn, Mirtazapine 7.5mg  daily.

## 2016-06-08 NOTE — Assessment & Plan Note (Signed)
05/24/16 Hgb 11.5

## 2016-06-08 NOTE — Progress Notes (Signed)
Location:  [facility] Room Number: 8 Place of Service:  ALF [phone]) Provider:  Maegan Buller, [place]  NP  Estill Dooms, MD  Patient Care Team: Estill Dooms, MD as PCP - General (Internal Medicine) Melina Modena, [facility] Tyshika Baldridge X, NP as Nurse Practitioner (Nurse Practitioner) Shon Hough, MD as Consulting Physician (Ophthalmology) Molli Posey, MD as Consulting Physician (Obstetrics and Gynecology) Gerarda Fraction, MD as Consulting Physician (Ophthalmology) Lorelle Gibbs, MD as Consulting Physician (Radiology) Melrose Nakayama, MD as Consulting Physician (Orthopedic Surgery) Gerarda Fraction, MD as Referring Physician (Ophthalmology) Ardis Hughs, MD as Attending Physician (Urology) Magrinat, Virgie Dad, MD as Consulting Physician (Oncology)  Extended Emergency Contact Information Primary Emergency Contact: Black,Emily Address: [address]          [place], [place] [address] [name] of [place] Phone: [phone] Mobile Phone: [phone] Relation: Daughter Secondary Emergency Contact: Marcine Matar [place] of [place] Mobile Phone: [phone] Relation: Daughter  Code Status:  DNR Goals of care: Advanced Directive information Advanced Directives 06/08/2016  Does Patient Have a Medical Advance Directive? Yes  Type of Advance Directive Out of facility DNR (pink MOST or yellow form);[place];Living will  Does patient want to make changes to medical advance directive? No - Patient declined  Copy of [place] in Chart? Yes  Would patient like information on creating a medical advance directive? -  Pre-existing out of facility DNR order (yellow form or pink MOST form) Yellow form placed in chart (order not valid for inpatient use)     Chief Complaint  Patient presents with  . Acute Visit    Rt cheek red and swollen    HPI:  Pt is a 83 y.o. female seen today for an acute visit for  The right cheek redness, warmth, mild swelling, denied pain, itching, or numbness.   06/04/16 CXR right basilar opacity may represent acute infectious process, will tx 7 day course of Augmentin 875mg  bid and medrol dose pk.     Hx of constipation, taking MiraLax daily, takes TMP nightly for UTI suppression, she takes Xanax 0.5mg , Mirtazapine for anxiety, prn Ambien for sleep. She takes Tramadol ac and hs, prn Tylenol. BLE edema, stable, taking Furosemide 40mg  qd, X-ray 04/02/16 L ribs, L spine, T spine showed acute mildly displaced left first to tenth rib fractures. T11 vertebral body 80% reduction in height. Taking Tramadol tid for pain   Past Medical History:  Diagnosis Date  . Abnormal PET scan of mediastinum 11/11/2015  . Abnormalities of the hair 02/29/2012  . Arthritis   . BPV (benign positional vertigo)   . Cancer ([name])   . Cancer antigen 125 (CA 125) elevation 11/27/2013  . Candidiasis of skin and nails 03/19/2011  . 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 Chorioretinal scar, macular 01/23/2015  . Closed fracture of base of neck of femur ([place]) 03/18/2011  . Compression fracture of thoracic vertebra ([facility]) 11/24/2012  . Contact dermatitis and other eczema, due to unspecified cause 05/07/2011  . Depression 07/23/2014  . Diverticulosis of colon (without mention of hemorrhage) 03/19/2011  . Edema 05/07/2011  . HOH (hard of hearing)   . Hypertension 02/24/2016  . Hypopotassemia 03/26/2011  . Hyposmolality and/or hyponatremia 03/19/2011  . Hypotension, unspecified 03/18/2011  . IBS (irritable bowel syndrome) 11/27/2013  . Idiopathic scoliosis 01/27/2016  . Impaired fasting glucose 03/19/2011  . Insomnia, unspecified 03/19/2011  . Kidney infection   . Lumbago 03/19/2011  . Macular degeneration   .  Malignant neoplasm of breast (female), unspecified site 10/28/2002  . Mass of right lung 10/23/2015  . Metastasis to adrenal gland ([facility]) 11/21/2015   Breast cancer  . Metastatic breast cancer  ([facility]) 10/27/2004  . Nonexudative age-related macular degeneration 12/10/2010  . Osteoporosis 08/06/2014  . Osteoporosis, unspecified 03/19/2011  . Other malaise and fatigue 03/19/2011  . Pain in joint, pelvic region and thigh 03/19/2011  . Primary osteoarthritis of right hip 05/28/2014   TOTAL HIP ARTHROPLASTY ANTERIOR APPROACH HARDWARE REMOVAL on 05/28/2014   . Purpura senilis ([facility]) 07/31/2013  . Radiculopathy, cervical 03/23/2016   Right C6  . Rib fractures   . Right bundle branch block 08/30/2011  . Shortness of breath   . Spasm of muscle 03/19/2011  . Spinal stenosis, lumbar region, with neurogenic claudication 11/02/2011  . TMJ click 04/09/5186  . Trigger finger (acquired) 11/02/2011  . Unspecified constipation 03/19/2011  . Urinary frequency 04/24/2013  . UTI (urinary tract infection) 04/16/2013   04/16/13 pseudomonas aeruginosa: tx with Cipro 06/15/14 P. Aeruginosa Fortaz 500mg  IM q12h x 7 days.     . Vaginitis and vulvovaginitis 02/29/2012  . Vertigo 08/26/2015   Past Surgical History:  Procedure Laterality Date  . BREAST SURGERY Bilateral 2005-10/27/2004   lumpectomy- Streck,MD  . CATARACT EXTRACTION  2010   bialteral  . EYE SURGERY     cataract  . FRACTURE SURGERY Left 09/1980   ankle  . HARDWARE REMOVAL Right 05/28/2014   Procedure: HARDWARE REMOVAL;  Surgeon: Melrose Nakayama, MD;  Location: [facility];  Service: Orthopedics;  Laterality: Right;  . HIP PINNING,CANNULATED  03/08/2011   Procedure: CANNULATED HIP PINNING;  Surgeon: Johnn Hai, MD;  Location: [facility];  Service: Orthopedics;  Laterality: Right;  . ORIF HIP FRACTURE Right 03/08/2011   Bean,MD  . SKIN CANCER EXCISION Left 10/12/2012   lower leg Dr. [place]ble [place]  . SKIN LESION EXCISION Right 02/04/2011   Abdomen lesion spongiotic dermatitis-Taffeen, MD  . SQUAMOUS CELL CARCINOMA EXCISION Right 02/04/2011   forearmSyble Creek, MD  . TONSILLECTOMY    . TOTAL HIP ARTHROPLASTY Right 05/28/2014   Procedure: TOTAL HIP ARTHROPLASTY  ANTERIOR APPROACH;  Surgeon: Melrose Nakayama, MD;  Location: [facility];  Service: Orthopedics;  Laterality: Right;    Allergies  Allergen Reactions  . Ciprofloxacin Other (See Comments)    Reaction:  Unknown   . Macrodantin [Nitrofurantoin] Other (See Comments)    Reaction:  Unknown   . Trimethoprim Other (See Comments)    Reaction:  Unknown   . Bactrim [Sulfamethoxazole-Trimethoprim] Other (See Comments)    Reaction:  Unknown     Outpatient Encounter Prescriptions as of 06/08/2016  Medication Sig  . acetaminophen (TYLENOL) 500 MG tablet Take 1,000 mg by mouth every 6 (six) hours as needed for moderate pain.   Marland Kitchen ALPRAZolam (XANAX) 0.5 MG tablet Take 0.5 mg by mouth daily. Pt is also able to use twice daily as needed for anxiety.  Marland Kitchen amoxicillin-clavulanate (AUGMENTIN) 875-125 MG tablet Take 1 tablet by mouth 2 (two) times daily.  Marland Kitchen anastrozole (ARIMIDEX) 1 MG tablet Take 1 tablet (1 mg total) by mouth daily.  Marland Kitchen antiseptic oral rinse (BIOTENE) LIQD 15 mLs by Mouth Rinse route 2 (two) times daily as needed for dry mouth.   Marland Kitchen aspirin 81 MG chewable tablet Chew 81 mg by mouth daily.   . Bilberry 150 MG CAPS Take 150 mg by mouth daily.  . Calcium Carbonate-Vitamin D (CALCIUM 600+D) 600-400 MG-UNIT tablet Take 1 tablet by mouth 2 (two)  times daily.  . Cranberry-Cholecalciferol (SUPER CRANBERRY/VITAMIN D3) 4200-500 MG-UNIT CAPS Take 1 capsule by mouth daily.  . furosemide (LASIX) 40 MG tablet Take 40 mg by mouth daily.   . Glucosamine-Chondroitin (GLUCOSAMINE CHONDR COMPLEX PO) Take 1 tablet by mouth daily. for joints  . guaiFENesin (MUCINEX) 600 MG 12 hr tablet Take 600 mg by mouth 2 (two) times daily as needed for cough.  . Lactobacillus-Inulin (CULTURELLE DIGESTIVE HEALTH PO) Take 1 tablet by mouth daily.  . meclizine (ANTIVERT) 25 MG tablet Take 1 tablet (25 mg total) by mouth 3 (three) times daily as needed for dizziness.  . Meth-Hyo-M Bl-Na Phos-Ph Sal (URIBEL) 118 MG CAPS One up to 3 times  daily as needed for dysuria (Patient taking differently: Take 118 mg by mouth 3 (three) times daily as needed (for dysuria). )  . mirtazapine (REMERON) 7.5 MG tablet Take 7.5 mg by mouth at bedtime.  . Multiple Vitamins-Minerals (PRESERVISION/LUTEIN) CAPS Take 1 capsule by mouth 2 (two) times daily.  Marland Kitchen omeprazole (PRILOSEC) 20 MG capsule Take 40 mg by mouth daily before breakfast.   . ondansetron (ZOFRAN-ODT) 4 MG disintegrating tablet Take 4 mg by mouth every 8 (eight) hours as needed for nausea or vomiting.  . polycarbophil (FIBERCON) 625 MG tablet Take 625 mg by mouth 2 (two) times daily.  . polyethylene glycol (MIRALAX / GLYCOLAX) packet Take 17 g by mouth at bedtime. Pt also uses once daily as needed for constipation.  . saccharomyces boulardii (FLORASTOR) 250 MG capsule Take 250 mg by mouth 2 (two) times daily.  . traMADol (ULTRAM) 50 MG tablet Take 50 mg by mouth 4 (four) times daily -  with meals and at bedtime.   Marland Kitchen trimethoprim (TRIMPEX) 100 MG tablet Take 100 mg by mouth at bedtime.  Marland Kitchen zolpidem (AMBIEN) 5 MG tablet Take 5 mg by mouth at bedtime as needed for sleep.   No facility-administered encounter medications on file as of 06/08/2016.     Review of Systems  Constitutional: Positive for fatigue. Negative for activity change, appetite change, fever and unexpected weight change.  HENT: Positive for congestion and hearing loss (bilateral earing aides). Negative for ear pain.   Eyes:       History of loss of visual acuity and macular degeneration.  Respiratory: Positive for cough. Negative for shortness of breath.        History of breast discomfort. History of removal of a lump of cancer from the right breast. Dyspnea on exertion.  Cardiovascular: Positive for leg swelling (tight, shiny skin). Negative for chest pain and palpitations.  Gastrointestinal: Positive for nausea (improved on Zofran prior to each meal).       She frequently complains that her intestines are not working  right. There is a previous history of diverticulosis. Her last colonoscopy was in 2008. She has been seen by Dr. Paulita Fujita in the past. Constipation - she associates with Tramadol  Genitourinary: Positive for dysuria and frequency. Negative for decreased urine volume, difficulty urinating, vaginal bleeding and vaginal discharge.       Complains of a slow, weak urinary stream. Denies dysuria. Has increased frequency and nocturia. Saw Dr. Louis Meckel, urologist. She was taught self-catheterization for a bladder that does not empty well. Vaginal discomfort. Managed by Dr. Claudean Kinds. History of elevated CEA 125. Normal ultrasound of the abdomen and genitourinary tract. Recurrent UTI.  Musculoskeletal: Positive for back pain and gait problem (unstable. using walker.).       Chronic back pain.  Patient has a  shorter right leg and has a lift in the right shoe. Now seeing Dr. Rhona Raider.  Right hip arthroplasty 5/16 There is generalized stiffness in the joints. There is a history of spinal stenosis. Left third finger catches on extension sometimes. There is a trigger finger. Pain at the left iliac crest and left anterior ribs near the sternum. Left rib cage mild pain with deep breath and truncal movement.   Skin:       History of skin discoloration and changes of nails.  [place] in late Oct 2017 and contused the left side of the back The right cheek redness, warmth, mild swelling, denied pain, itching, or numbness.   Allergic/Immunologic: Negative.   Neurological: Negative for dizziness, tremors, seizures and numbness.       History of spinal stenosis with neurogenic claudication affecting the right leg. Complains of pain in the buttocks that is suggestive of neuralgia from her spinal stenosis. Hospitalized for 24 hours 08/24/2015 for vertigo.  Hematological: Bruises/bleeds easily.  Psychiatric/Behavioral: Positive for dysphoric mood.    Immunization History  Administered Date(s) Administered  . Influenza  Whole 10/12/2011, 10/12/2012  . Influenza-Unspecified 10/25/2013, 10/10/2014, 12/12/2015  . PPD Test 07/07/2010  . Pneumococcal Conjugate-13 01/12/2003  . Zoster 01/12/2007   Pertinent  Health Maintenance Due  Topic Date Due  . PNA vac Low Risk Adult (2 of 2 - PPSV23) 01/12/2004  . INFLUENZA VACCINE  08/11/2016  . MAMMOGRAM  08/11/2016  . DEXA SCAN  Completed   Fall Risk  11/11/2015 10/16/2015 07/03/2015 01/28/2015 10/22/2014  Falls in the past year? Yes No No No No  Number falls in past yr: 1 - - - -  Injury with Fall? Yes - - - -  Risk Factor Category  High Fall Risk - - - -  Risk for fall due to : History of fall(s);Impaired balance/gait;Impaired mobility - - - -  Follow up Falls evaluation completed - - - -   Functional Status Survey:    Vitals:   06/08/16 1318  BP: (!) 144/66  Pulse: 88  Resp: 20  Temp: 97.4 F (36.3 C)  SpO2: 90%  Weight: 132 lb 9.6 oz (60.1 kg)  Height: 5\' 1"  (1.549 m)   Body mass index is 25.05 kg/m. Physical Exam  Constitutional: She is oriented to person, place, and time. She appears well-developed and well-nourished. No distress.  HENT:  HOH Nonocclusive wax in both EAC.  Eyes:  Corrective lenses.  Neck: No JVD present. No tracheal deviation present. No thyromegaly present.  Cardiovascular: Normal rate, regular rhythm and normal heart sounds.  Exam reveals no gallop and no friction rub.   No murmur heard. Pulses in RLE not palpable, has 2+ edema which could be obscuring pulses  Pulmonary/Chest: Effort normal. She has rales.  Posterior right lower lung decreased breath sound  Abdominal: Soft. Bowel sounds are normal. She exhibits no distension and no mass. There is no tenderness.  Musculoskeletal: Normal range of motion. She exhibits edema (both lower legs and has tight, shiny skin. Painless) and tenderness (T12-L1 area).  Trace edema BLE.   Lymphadenopathy:    She has no cervical adenopathy.  Neurological: She is alert and oriented to  person, place, and time. No cranial nerve deficit.  Mild memory loss  Skin: Skin is warm and dry. No pallor.  The right cheek redness, warmth, mild swelling, denied pain, itching, or numbness.    Psychiatric: Her behavior is normal. Thought content normal.    Labs reviewed:  Recent Labs  01/19/16 0800  02/18/16 1204  04/26/16 1412 05/20/16 1658 05/24/16 1504  NA 134*  < > 129*  < > 135* 134* 137  K 4.5  < > 4.2  < > 4.5 4.1 4.1  CL 98  --  98*  --   --  100*  --   CO2 28  < > 23  < > 27 28 27   GLUCOSE 107*  < > 108*  < > 125 111* 101  BUN 15  < > 13  < > 11.3 15 15.3  CREATININE 0.81  < > 0.71  < > 0.9 0.81 1.0  CALCIUM 9.5  < > 8.9  < > 7.7* 7.6* 8.5  < > = values in this interval not displayed.  Recent Labs  04/26/16 1412 05/20/16 1658 05/24/16 1504  AST 36* 39 36*  ALT 25 21 22   ALKPHOS 264* 137* 154*  BILITOT 0.37 0.3 0.36  PROT 6.2* 5.9* 6.1*  ALBUMIN 3.4* 3.2* 3.2*    Recent Labs  04/26/16 1411 05/20/16 1658 05/24/16 1504  WBC 6.5 6.8 7.2  NEUTROABS 3.3 3.1 3.3  HGB 11.3* 10.9* 11.5*  HCT 34.6* 34.1* 36.0  MCV 92.4 91.9 92.5  PLT 230 235 235   Lab Results  Component Value Date   TSH 3.215 08/25/2015   Lab Results  Component Value Date   HGBA1C 5.9 01/23/2015   No results found for: CHOL, HDL, LDLCALC, LDLDIRECT, TRIG, CHOLHDL  Significant Diagnostic Results in last 30 days:  Nm Pet Image Restag (ps) Skull Base To Thigh  Result Date: 05/10/2016 CLINICAL DATA:  Subsequent treatment strategy for restaging have right breast cancer. Left adrenal metastasis. EXAM: NUCLEAR MEDICINE PET SKULL BASE TO THIGH TECHNIQUE: 8.3 mCi F-18 FDG was injected intravenously. Full-ring PET imaging was performed from the skull base to thigh after the radiotracer. CT data was obtained and used for attenuation correction and anatomic localization. FASTING BLOOD GLUCOSE:  Value: 104 mg/dl COMPARISON:  03/16/2016 FINDINGS: NECK No cervical nodal hypermetabolism. Carotid  atherosclerosis. No cervical adenopathy. CHEST Right paratracheal node measures 8 mm and a S.U.V. max of 3.3 on image 56/series 4. Compare 9 mm and a S.U.V. max of 4.2 on the prior. Subcarinal node measures 8 mm and a S.U.V. max of 5.6 versus similar in size and a S.U.V. max of 4.4 on the prior exam. Medial right lung base nodularity is less well-defined today. Measures a S.U.V. max of 2.2 on image 52/series 8. Compare a S.U.V. max of 3.7 on the prior exam. An area of more cephalad anterior right upper or right middle lobe Mild hypermetabolic pulmonary opacity is favored to represent scarring, including on image 30/series 8. Bilateral areas of chronic consolidation which are likely due to post infectious or inflammatory scarring and possible radiation fibrosis. Moderate right pleural effusion is similar. ABDOMEN/PELVIS Left adrenal enlargement again identified. This measures 1.6 x 1.2 cm and a S.U.V. max of 7.1 on image 96/series 4. Compare 1.8 x 1.1 cm and a S.U.V. max of 6.0 on the prior exam. Right adrenal hypermetabolism measures a S.U.V. max of 4.9 today versus a S.U.V. max of 4.3 on the prior. No abdominopelvic nodal hypermetabolism. Degraded evaluation of the pelvis, secondary to beam hardening artifact from right hip arthroplasty. Suspect trace right pelvic fluid, including on image 147/ series 4. Unchanged. Colonic stool burden suggests constipation. SKELETON Multifocal rib hypermetabolism, primarily corresponding to chronic fractures. Example in the left first rib measuring a S.U.V. max of 3.7 today versus a  S.U.V. max of 5.5 on the prior. Right hip arthroplasty. Relatively diffuse heterogeneous sclerosis is similar. Left greater than right rib fractures, many of which have undergone interval healing. IMPRESSION: 1. Overall similar disease burden compared to the prior PET. 2. Some lesions are mildly increased in hypermetabolism, while others are similar to improved. 3. No new sites of disease identified.  4. Similar moderate right pleural effusion. Electronically Signed   By: Abigail Miyamoto M.D.   On: 05/10/2016 09:14    Assessment/Plan Right lower lobe pneumonia ([place]) 06/04/16 CXR right basilar opacity may represent acute infectious process, will tx 7 day course of Augmentin 875mg  bid and medrol dose pk.   Atopic dermatitis, unspecified  The right cheek redness, warmth, mild swelling, denied pain, itching, or numbness. She denied change of cosmetic products  06/04/16 CXR right basilar opacity may represent acute infectious process, will tx 7 day course of Augmentin 875mg  bid and medrol dose pk.    Atopic vs systemic reaction to Augmentin, will apply 1% hydrocortisone cream bid to affected area x 7 days, observe the patient.   Hypertension Normalized, continue Furosemide 40mg  daily, 05/24/16 Na 137, K 4.1, Bun 49m, creat 1.0, wbc 7.2, Hgb 11.5, plt 235   Constipation Stable, continue MiraLax daily, Fibercon bid.   GERD (gastroesophageal reflux disease) Better, continue  Zofran 4mg  qam and prn, , Mirtazapine 7.5mg  qhs, Omeprazole 20mg  daily.   UTI (urinary tract infection) Continue Trimethoprim for UTI suppression therapy.   Anemia, chronic disease 05/24/16 Hgb 11.5  Insomnia Takes Zolpidem 5mg  prn at night for sleep    Hyponatremia 05/24/16 Na 137  Depression Her mood is managed with Alprazolam qam, bid prn, Mirtazapine 7.5mg  daily.   Edema trace edema BLE, chronic, monitor weight, continue Furosemide 40mg  daily  Anxiety Her mood is managed with Alprazolam qam, bid prn, Mirtazapine 7.5mg  daily.    Pain from bone metastases ([place]) X-ray 04/02/16 L ribs, L spine, T spine showed acute mildly displaced left first to tenth rib fractures. T11 vertebral body 80% reduction in height.  Pain is managed with Tramadol qid, prn Ibuprofen, prn Tylenol      Family/ staff Communication: AL  Labs/tests ordered:  none

## 2016-06-08 NOTE — Assessment & Plan Note (Signed)
The right cheek redness, warmth, mild swelling, denied pain, itching, or numbness. She denied change of cosmetic products  06/04/16 CXR right basilar opacity may represent acute infectious process, will tx 7 day course of Augmentin 875mg  bid and medrol dose pk.    Atopic vs systemic reaction to Augmentin, will apply 1% hydrocortisone cream bid to affected area x 7 days, observe the patient.

## 2016-06-08 NOTE — Assessment & Plan Note (Signed)
06/04/16 CXR right basilar opacity may represent acute infectious process, will tx 7 day course of Augmentin 875mg  bid and medrol dose pk.

## 2016-06-08 NOTE — Assessment & Plan Note (Signed)
trace edema BLE, chronic, monitor weight, continue Furosemide 40mg  daily

## 2016-06-08 NOTE — Assessment & Plan Note (Signed)
X-ray 04/02/16 L ribs, L spine, T spine showed acute mildly displaced left first to tenth rib fractures. T11 vertebral body 80% reduction in height.  Pain is managed with Tramadol qid, prn Ibuprofen, prn Tylenol

## 2016-06-08 NOTE — Assessment & Plan Note (Signed)
Takes Zolpidem 5mg  prn at night for sleep

## 2016-06-08 NOTE — Assessment & Plan Note (Signed)
05/24/16 Na 137

## 2016-06-08 NOTE — Assessment & Plan Note (Signed)
Normalized, continue Furosemide 40mg  daily, 05/24/16 Na 137, K 4.1, Bun 14m, creat 1.0, wbc 7.2, Hgb 11.5, plt 235

## 2016-06-08 NOTE — Assessment & Plan Note (Signed)
Better, continue  Zofran 4mg  qam and prn, , Mirtazapine 7.5mg  qhs, Omeprazole 20mg  daily.

## 2016-06-08 NOTE — Assessment & Plan Note (Signed)
Stable, continue MiraLax daily, Fibercon bid.

## 2016-06-14 ENCOUNTER — Encounter: Payer: Self-pay | Admitting: Internal Medicine

## 2016-06-14 DIAGNOSIS — N390 Urinary tract infection, site not specified: Secondary | ICD-10-CM | POA: Diagnosis not present

## 2016-06-15 ENCOUNTER — Non-Acute Institutional Stay: Payer: Medicare Other | Admitting: Internal Medicine

## 2016-06-15 DIAGNOSIS — D72829 Elevated white blood cell count, unspecified: Secondary | ICD-10-CM | POA: Diagnosis not present

## 2016-06-15 DIAGNOSIS — E871 Hypo-osmolality and hyponatremia: Secondary | ICD-10-CM | POA: Diagnosis not present

## 2016-06-15 DIAGNOSIS — L209 Atopic dermatitis, unspecified: Secondary | ICD-10-CM

## 2016-06-15 LAB — CBC AND DIFFERENTIAL
HCT: 34 — AB (ref 36–46)
HEMOGLOBIN: 11 — AB (ref 12.0–16.0)
Platelets: 404 — AB (ref 150–399)
WBC: 7.8

## 2016-06-15 LAB — BASIC METABOLIC PANEL
BUN: 13 (ref 4–21)
CREATININE: 0.8 (ref 0.5–1.1)
GLUCOSE: 100
POTASSIUM: 4.7 (ref 3.4–5.3)
Sodium: 129 — AB (ref 137–147)

## 2016-06-15 LAB — HEPATIC FUNCTION PANEL
ALT: 13 (ref 7–35)
AST: 24 (ref 13–35)
Alkaline Phosphatase: 137 — AB (ref 25–125)
Bilirubin, Total: 0.4

## 2016-06-15 MED ORDER — DIPHENHYDRAMINE-ZINC ACETATE 1-0.1 % EX CREA: TOPICAL_CREAM | CUTANEOUS | 0 refills | Status: DC

## 2016-06-15 NOTE — Progress Notes (Signed)
[place] Room Number: AL 8  Place of Service: ALF (13)     Allergies  Allergen Reactions  . Ciprofloxacin Other (See Comments)    Reaction:  Unknown   . Macrodantin [Nitrofurantoin] Other (See Comments)    Reaction:  Unknown   . Trimethoprim Other (See Comments)    Reaction:  Unknown   . Bactrim [Sulfamethoxazole-Trimethoprim] Other (See Comments)    Reaction:  Unknown     Chief Complaint  Patient presents with  . Acute Visit    rash on the left cheek x 5 days    HPI:  Atopic dermatitis, unspecified type - persistent problem despite use of 1% HC cream since 5/229/18. Staff asked me to see patient for reevaluation today.    Medications: Patient's Medications  New Prescriptions   No medications on file  Previous Medications   ACETAMINOPHEN (TYLENOL) 500 MG TABLET    Take 1,000 mg by mouth every 6 (six) hours as needed for moderate pain.    ALPRAZOLAM (XANAX) 0.5 MG TABLET    Take 0.5 mg by mouth daily. Pt is also able to use twice daily as needed for anxiety.   ANASTROZOLE (ARIMIDEX) 1 MG TABLET    Take 1 tablet (1 mg total) by mouth daily.   ANTISEPTIC ORAL RINSE (BIOTENE) LIQD    15 mLs by Mouth Rinse route 2 (two) times daily as needed for dry mouth.    ASPIRIN 81 MG CHEWABLE TABLET    Chew 81 mg by mouth daily.    BILBERRY 150 MG CAPS    Take 150 mg by mouth daily.   CALCIUM CARBONATE-VITAMIN D (CALCIUM 600+D) 600-400 MG-UNIT TABLET    Take 1 tablet by mouth 2 (two) times daily.   CRANBERRY-CHOLECALCIFEROL (SUPER CRANBERRY/VITAMIN D3) 4200-500 MG-UNIT CAPS    Take 1 capsule by mouth daily.   FUROSEMIDE (LASIX) 40 MG TABLET    Take 40 mg by mouth daily.    GLUCOSAMINE-CHONDROITIN (GLUCOSAMINE CHONDR COMPLEX PO)    Take 1 tablet by mouth daily. for joints   GUAIFENESIN (MUCINEX) 600 MG 12 HR TABLET    Take 600 mg by mouth 2 (two) times daily as needed for cough.   LACTOBACILLUS-INULIN (CULTURELLE DIGESTIVE HEALTH PO)    Take 1 tablet by mouth  daily.   MECLIZINE (ANTIVERT) 25 MG TABLET    Take 1 tablet (25 mg total) by mouth 3 (three) times daily as needed for dizziness.   METH-HYO-M BL-NA PHOS-PH SAL (URIBEL) 118 MG CAPS    One up to 3 times daily as needed for dysuria   MIRTAZAPINE (REMERON) 7.5 MG TABLET    Take 7.5 mg by mouth at bedtime.   MULTIPLE VITAMINS-MINERALS (PRESERVISION/LUTEIN) CAPS    Take 1 capsule by mouth 2 (two) times daily.   OMEPRAZOLE (PRILOSEC) 20 MG CAPSULE    Take 40 mg by mouth daily before breakfast.    ONDANSETRON (ZOFRAN-ODT) 4 MG DISINTEGRATING TABLET    Take 4 mg by mouth every 8 (eight) hours as needed for nausea or vomiting.   POLYCARBOPHIL (FIBERCON) 625 MG TABLET    Take 625 mg by mouth 2 (two) times daily.   POLYETHYLENE GLYCOL (MIRALAX / GLYCOLAX) PACKET    Take 17 g by mouth at bedtime. Pt also uses once daily as needed for constipation.   SACCHAROMYCES BOULARDII (FLORASTOR) 250 MG CAPSULE    Take 250 mg by mouth 2 (two) times daily.   TRAMADOL (ULTRAM) 50 MG TABLET  Take 50 mg by mouth 4 (four) times daily -  with meals and at bedtime.    TRIMETHOPRIM (TRIMPEX) 100 MG TABLET    Take 100 mg by mouth at bedtime.   ZOLPIDEM (AMBIEN) 5 MG TABLET    Take 5 mg by mouth at bedtime as needed for sleep.  Modified Medications   No medications on file  Discontinued Medications   No medications on file     Review of Systems  Constitutional: Positive for fatigue. Negative for activity change, appetite change, fever and unexpected weight change.  HENT: Positive for congestion and hearing loss (bilateral earing aides). Negative for ear pain.   Eyes:       History of loss of visual acuity and macular degeneration.  Respiratory: Positive for cough. Negative for shortness of breath.        History of breast discomfort. History of removal of a lump of cancer from the right breast. Dyspnea on exertion.  Cardiovascular: Positive for leg swelling (tight, shiny skin). Negative for chest pain and palpitations.   Gastrointestinal: Positive for nausea (improved on Zofran prior to each meal).       She frequently complains that her intestines are not working right. There is a previous history of diverticulosis. Her last colonoscopy was in 2008. She has been seen by Dr. Paulita Fujita in the past. Constipation - she associates with Tramadol  Genitourinary: Positive for dysuria and frequency. Negative for decreased urine volume, difficulty urinating, vaginal bleeding and vaginal discharge.       Complains of a slow, weak urinary stream. Denies dysuria. Has increased frequency and nocturia. Saw Dr. Louis Meckel, urologist. She was taught self-catheterization for a bladder that does not empty well. Vaginal discomfort. Managed by Dr. Claudean Kinds. History of elevated CEA 125. Normal ultrasound of the abdomen and genitourinary tract. Recurrent UTI.  Musculoskeletal: Positive for back pain and gait problem (unstable. using walker.).       Chronic back pain.  Patient has a shorter right leg and has a lift in the right shoe. Now seeing Dr. Rhona Raider.  Right hip arthroplasty 5/16 There is generalized stiffness in the joints. There is a history of spinal stenosis. Left third finger catches on extension sometimes. There is a trigger finger. Pain at the left iliac crest and left anterior ribs near the sternum. Left rib cage mild pain with deep breath and truncal movement.   Skin:       History of skin discoloration and changes of nails.  [place] in late Oct 2017 and contused the left side of the back The left cheek has redness, warmth, mild swelling, Denied pain, itching, or numbness. Has not responded to 1% HC cream.  Allergic/Immunologic: Negative.   Neurological: Negative for dizziness, tremors, seizures and numbness.       History of spinal stenosis with neurogenic claudication affecting the right leg. Complains of pain in the buttocks that is suggestive of neuralgia from her spinal stenosis. Hospitalized for 24 hours 08/24/2015  for vertigo.  Hematological: Bruises/bleeds easily.  Psychiatric/Behavioral: Positive for dysphoric mood.    Vitals:   06/15/16 1237  BP: 120/80  Pulse: 80  Resp: 16  Temp: 98.6 F (37 C)   Wt Readings from Last 3 Encounters:  06/08/16 132 lb 9.6 oz (60.1 kg)  06/04/16 132 lb 9.6 oz (60.1 kg)  05/24/16 131 lb 1.6 oz (59.5 kg)    There is no height or weight on file to calculate BMI.  Physical Exam  Constitutional: She is oriented  to person, place, and time. She appears well-developed and well-nourished. No distress.  HENT:  HOH Nonocclusive wax in both EAC.  Eyes:  Corrective lenses.  Neck: No JVD present. No tracheal deviation present. No thyromegaly present.  Cardiovascular: Normal rate, regular rhythm and normal heart sounds.  Exam reveals no gallop and no friction rub.   No murmur heard. Pulses in RLE not palpable, has 2+ edema which could be obscuring pulses  Pulmonary/Chest: Effort normal. She has rales.  Posterior right lower lung decreased breath sound  Abdominal: Soft. Bowel sounds are normal. She exhibits no distension and no mass. There is no tenderness.  Musculoskeletal: Normal range of motion. She exhibits edema (both lower legs and has tight, shiny skin. Painless) and tenderness (T12-L1 area).  Trace edema BLE.   Lymphadenopathy:    She has no cervical adenopathy.  Neurological: She is alert and oriented to person, place, and time. No cranial nerve deficit.  Mild memory loss  Skin: Skin is warm and dry. No pallor.  The left cheek has redness, warmth, mild swelling. Denied pain, itching, or numbness.    Psychiatric: Her behavior is normal. Thought content normal.     Labs reviewed: Lab Summary Latest Ref Rng & Units 05/24/2016 05/20/2016 04/26/2016  Hemoglobin 11.6 - 15.9 g/dL 11.5(L) 10.9(L) 11.3(L)  Hematocrit 34.8 - 46.6 % 36.0 34.1(L) 34.6(L)  White count 3.9 - 10.3 10e3/uL 7.2 6.8 6.5  Platelet count 145 - 400 10e3/uL 235 235 230  Sodium 136 - 145  mEq/L 137 134(L) 135(L)  Potassium 3.5 - 5.1 mEq/L 4.1 4.1 4.5  Calcium 8.4 - 10.4 mg/dL 8.5 7.6(L) 7.7(L)  Phosphorus - (None) (None) (None)  Creatinine 0.6 - 1.1 mg/dL 1.0 0.81 0.9  AST 5 - 34 U/L 36(H) 39 36(H)  Alk Phos 40 - 150 U/L 154(H) 137(H) 264(H)  Bilirubin 0.20 - 1.20 mg/dL 0.36 0.3 0.37  Glucose 70 - 140 mg/dl 101 111(H) 125  Cholesterol - (None) (None) (None)  HDL cholesterol - (None) (None) (None)  Triglycerides - (None) (None) (None)  LDL Direct - (None) (None) (None)  LDL Calc - (None) (None) (None)  Total protein 6.4 - 8.3 g/dL 6.1(L) 5.9(L) 6.2(L)  Albumin 3.5 - 5.0 g/dL 3.2(L) 3.2(L) 3.4(L)  Some recent data might be hidden   Lab Results  Component Value Date   TSH 3.215 08/25/2015   Lab Results  Component Value Date   BUN 15.3 05/24/2016   BUN 15 05/20/2016   BUN 11.3 04/26/2016   Lab Results  Component Value Date   CREATININE 1.0 05/24/2016   CREATININE 0.81 05/20/2016   CREATININE 0.9 04/26/2016   Lab Results  Component Value Date   HGBA1C 5.9 01/23/2015       Assessment/Plan  1. Atopic dermatitis, unspecified type - diphenhydrAMINE-zinc acetate (BENADRYL ITCH STOPPING) cream; Apply twice daily to the rash on the left cheek.  Dispense: 28.3 g; Refill: 0

## 2016-06-16 ENCOUNTER — Encounter: Payer: Self-pay | Admitting: Internal Medicine

## 2016-06-16 ENCOUNTER — Non-Acute Institutional Stay: Payer: Medicare Other | Admitting: Internal Medicine

## 2016-06-16 DIAGNOSIS — E871 Hypo-osmolality and hyponatremia: Secondary | ICD-10-CM

## 2016-06-16 DIAGNOSIS — R600 Localized edema: Secondary | ICD-10-CM | POA: Diagnosis not present

## 2016-06-16 NOTE — Progress Notes (Signed)
Location:  [facility] Room Number: 8 Place of Service:  ALF [phone]) Provider:    Blanchie Serve, MD  Patient Care Team: Blanchie Serve, MD as PCP - General (Internal Medicine) Melina Modena, [facility], Man X, NP as Nurse Practitioner (Nurse Practitioner) Shon Hough, MD as Consulting Physician (Ophthalmology) Molli Posey, MD as Consulting Physician (Obstetrics and Gynecology) Gerarda Fraction, MD as Consulting Physician (Ophthalmology) Lorelle Gibbs, MD as Consulting Physician (Radiology) Melrose Nakayama, MD as Consulting Physician (Orthopedic Surgery) Gerarda Fraction, MD as Referring Physician (Ophthalmology) Ardis Hughs, MD as Attending Physician (Urology) Magrinat, Virgie Dad, MD as Consulting Physician (Oncology)  Extended Emergency Contact Information Primary Emergency Contact: Black,Emily Address: [address]          [place], [place] [address] [name] of [place] Phone: [phone] Mobile Phone: [phone] Relation: Daughter Secondary Emergency Contact: Marcine Matar [place] of [place] Mobile Phone: [phone] Relation: Daughter  Code Status:  DNR  Goals of care: Advanced Directive information Advanced Directives 06/08/2016  Does Patient Have a Medical Advance Directive? Yes  Type of Advance Directive Out of facility DNR (pink MOST or yellow form);[place];Living will  Does patient want to make changes to medical advance directive? No - Patient declined  Copy of [place] in Chart? Yes  Would patient like information on creating a medical advance directive? -  Pre-existing out of facility DNR order (yellow form or pink MOST form) Yellow form placed in chart (order not valid for inpatient use)     Chief Complaint  Patient presents with  . Acute Visit    Abnormal labs    HPI:  Patient is a 83 y.o. female seen today for an acute visit for abnormal lab. Her lab  work shows hyponatremia. No acute concern from nursing. No behavior change or change in mental state reported. Per patient, she has poor appetite and poor fluid intake. She denies any nausea or vomiting. No diarrhea reported.   Past Medical History:  Diagnosis Date  . Abnormal PET scan of mediastinum 11/11/2015  . Abnormalities of the hair 02/29/2012  . Arthritis   . BPV (benign positional vertigo)   . Cancer ([facility])   . Cancer antigen 125 (CA 125) elevation 11/27/2013  . Candidiasis of skin and nails 03/19/2011  . 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 Chorioretinal scar, macular 01/23/2015  . Closed fracture of base of neck of femur ([place]) 03/18/2011  . Compression fracture of thoracic vertebra ([facility]) 11/24/2012  . Contact dermatitis and other eczema, due to unspecified cause 05/07/2011  . Depression 07/23/2014  . Diverticulosis of colon (without mention of hemorrhage) 03/19/2011  . Edema 05/07/2011  . HOH (hard of hearing)   . Hypertension 02/24/2016  . Hypopotassemia 03/26/2011  . Hyposmolality and/or hyponatremia 03/19/2011  . Hypotension, unspecified 03/18/2011  . IBS (irritable bowel syndrome) 11/27/2013  . Idiopathic scoliosis 01/27/2016  . Impaired fasting glucose 03/19/2011  . Insomnia, unspecified 03/19/2011  . Kidney infection   . Lumbago 03/19/2011  . Macular degeneration   . Malignant neoplasm of breast (female), unspecified site 10/28/2002  . Mass of right lung 10/23/2015  . Metastasis to adrenal gland ([facility]) 11/21/2015   Breast cancer  . Metastatic breast cancer ([facility]) 10/27/2004  . Nonexudative age-related macular degeneration 12/10/2010  . Osteoporosis 08/06/2014  . Osteoporosis, unspecified 03/19/2011  . Other malaise and fatigue 03/19/2011  . Pain in joint, pelvic region and thigh 03/19/2011  . Primary osteoarthritis of right hip  05/28/2014   TOTAL HIP ARTHROPLASTY ANTERIOR APPROACH HARDWARE REMOVAL on 05/28/2014   . Purpura senilis ([facility]) 07/31/2013  . Radiculopathy,  cervical 03/23/2016   Right C6  . Rib fractures   . Right bundle branch block 08/30/2011  . Shortness of breath   . Spasm of muscle 03/19/2011  . Spinal stenosis, lumbar region, with neurogenic claudication 11/02/2011  . TMJ click 4/74/2595  . Trigger finger (acquired) 11/02/2011  . Unspecified constipation 03/19/2011  . Urinary frequency 04/24/2013  . UTI (urinary tract infection) 04/16/2013   04/16/13 pseudomonas aeruginosa: tx with Cipro 06/15/14 P. Aeruginosa [name] 500mg  IM q12h x 7 days.     . Vaginitis and vulvovaginitis 02/29/2012  . Vertigo 08/26/2015   Past Surgical History:  Procedure Laterality Date  . BREAST SURGERY Bilateral 2005-10/27/2004   lumpectomy- Streck,MD  . CATARACT EXTRACTION  2010   bialteral  . EYE SURGERY     cataract  . FRACTURE SURGERY Left 09/1980   ankle  . HARDWARE REMOVAL Right 05/28/2014   Procedure: HARDWARE REMOVAL;  Surgeon: Melrose Nakayama, MD;  Location: [facility];  Service: Orthopedics;  Laterality: Right;  . HIP PINNING,CANNULATED  03/08/2011   Procedure: CANNULATED HIP PINNING;  Surgeon: Johnn Hai, MD;  Location: [facility];  Service: Orthopedics;  Laterality: Right;  . ORIF HIP FRACTURE Right 03/08/2011   Bean,MD  . SKIN CANCER EXCISION Left 10/12/2012   lower leg Dr. [place]  . SKIN LESION EXCISION Right 02/04/2011   Abdomen lesion spongiotic dermatitis-Taffeen, MD  . SQUAMOUS CELL CARCINOMA EXCISION Right 02/04/2011   forearmSyble Creek, MD  . TONSILLECTOMY    . TOTAL HIP ARTHROPLASTY Right 05/28/2014   Procedure: TOTAL HIP ARTHROPLASTY ANTERIOR APPROACH;  Surgeon: Melrose Nakayama, MD;  Location: [facility];  Service: Orthopedics;  Laterality: Right;    Allergies  Allergen Reactions  . Ciprofloxacin Other (See Comments)    Reaction:  Unknown   . Macrodantin [Nitrofurantoin] Other (See Comments)    Reaction:  Unknown   . Trimethoprim Other (See Comments)    Reaction:  Unknown   . Bactrim [Sulfamethoxazole-Trimethoprim] Other (See Comments)     Reaction:  Unknown     Outpatient Encounter Prescriptions as of 06/16/2016  Medication Sig  . acetaminophen (TYLENOL) 500 MG tablet Take 1,000 mg by mouth every 6 (six) hours as needed for moderate pain.   Marland Kitchen ALPRAZolam (XANAX) 0.5 MG tablet Take 0.5 mg by mouth daily. Pt is also able to use twice daily as needed for anxiety.  Marland Kitchen anastrozole (ARIMIDEX) 1 MG tablet Take 1 tablet (1 mg total) by mouth daily.  Marland Kitchen antiseptic oral rinse (BIOTENE) LIQD 15 mLs by Mouth Rinse route 2 (two) times daily as needed for dry mouth.   Marland Kitchen aspirin 81 MG chewable tablet Chew 81 mg by mouth daily.   . Bilberry 150 MG CAPS Take 150 mg by mouth daily.  . Calcium Carbonate-Vitamin D (CALCIUM 600+D) 600-400 MG-UNIT tablet Take 1 tablet by mouth 2 (two) times daily.  . Cholecalciferol 1000 units tablet Take 1,000 Units by mouth daily.  . Cranberry-Cholecalciferol (SUPER CRANBERRY/VITAMIN D3) 4200-500 MG-UNIT CAPS Take 1 capsule by mouth daily.  . furosemide (LASIX) 40 MG tablet Take 40 mg by mouth daily.   . Glucosamine-Chondroitin (GLUCOSAMINE CHONDR COMPLEX PO) Take 2 tablets by mouth daily. for joints  . guaiFENesin (MUCINEX) 600 MG 12 hr tablet Take 600 mg by mouth 2 (two) times daily as needed for cough.  . hydrocortisone cream 1 % Apply 1 application topically  2 (two) times daily. Apply to left cheek  . ibuprofen (ADVIL,MOTRIN) 400 MG tablet Take 400 mg by mouth as needed.  . loperamide (IMODIUM A-D) 2 MG tablet Take 4 mg by mouth as needed for diarrhea or loose stools. Give 4 mg by mouth as the initial dose, then 2 mg by mouth after each loose stool.  . meclizine (ANTIVERT) 25 MG tablet Take 1 tablet (25 mg total) by mouth 3 (three) times daily as needed for dizziness.  . Meth-Hyo-M Bl-Na Phos-Ph Sal (URIBEL) 118 MG CAPS Take 1 capsule by mouth 3 (three) times daily as needed.  . mirtazapine (REMERON) 7.5 MG tablet Take 7.5 mg by mouth at bedtime.  . Multiple Vitamins-Minerals (PRESERVISION/LUTEIN) CAPS Take 1  capsule by mouth 2 (two) times daily.  . NON FORMULARY 1 bottle of gatorade as needed at residents request  . nystatin (MYCOSTATIN/NYSTOP) powder Apply topically daily. 1 application to bilateral breast (under)  . Omega-3 Fatty Acids (FISH OIL) 1000 MG CAPS Take 1 capsule by mouth daily.  Marland Kitchen omeprazole (PRILOSEC) 20 MG capsule Take 40 mg by mouth daily before breakfast.   . ondansetron (ZOFRAN-ODT) 4 MG disintegrating tablet Take 4 mg by mouth every 8 (eight) hours as needed for nausea or vomiting.  . polycarbophil (FIBERCON) 625 MG tablet Take 625 mg by mouth 2 (two) times daily.  . polyethylene glycol (MIRALAX / GLYCOLAX) packet Take 17 g by mouth at bedtime. Pt also uses once daily as needed for constipation.  . saccharomyces boulardii (FLORASTOR) 250 MG capsule Take 250 mg by mouth 2 (two) times daily.  . traMADol (ULTRAM) 50 MG tablet Take 50 mg by mouth 4 (four) times daily -  with meals and at bedtime.   Marland Kitchen trimethoprim (TRIMPEX) 100 MG tablet Take 100 mg by mouth at bedtime.  Marland Kitchen zolpidem (AMBIEN) 5 MG tablet Take 5 mg by mouth at bedtime as needed for sleep.  . [DISCONTINUED] diphenhydrAMINE-zinc acetate (BENADRYL ITCH STOPPING) cream Apply twice daily to the rash on the left cheek.  . [DISCONTINUED] Lactobacillus-Inulin ([place] PO) Take 1 tablet by mouth daily.  . [DISCONTINUED] Meth-Hyo-M Bl-Na Phos-Ph Sal (URIBEL) 118 MG CAPS One up to 3 times daily as needed for dysuria (Patient taking differently: Take 118 mg by mouth 3 (three) times daily as needed (for dysuria). )   No facility-administered encounter medications on file as of 06/16/2016.     Review of Systems  Constitutional: Negative for diaphoresis and fever.  HENT: Negative for congestion and rhinorrhea.   Respiratory: Negative for cough.        Occasional shortness of breath  Cardiovascular: Negative for chest pain and palpitations.  Gastrointestinal: Negative for abdominal pain.  Endocrine: Negative for  polydipsia and polyuria.  Genitourinary: Negative for dysuria.    Immunization History  Administered Date(s) Administered  . Influenza Whole 10/12/2011, 10/12/2012  . Influenza-Unspecified 10/25/2013, 10/10/2014, 12/12/2015  . PPD Test 07/07/2010  . Pneumococcal Conjugate-13 01/12/2003  . Zoster 01/12/2007   Pertinent  Health Maintenance Due  Topic Date Due  . PNA vac Low Risk Adult (2 of 2 - PPSV23) 01/12/2004  . INFLUENZA VACCINE  08/11/2016  . MAMMOGRAM  08/11/2016  . DEXA SCAN  Completed   Fall Risk  11/11/2015 10/16/2015 07/03/2015 01/28/2015 10/22/2014  Falls in the past year? Yes No No No No  Number falls in past yr: 1 - - - -  Injury with Fall? Yes - - - -  Risk Factor Category  High Fall  Risk - - - -  Risk for fall due to : History of fall(s);Impaired balance/gait;Impaired mobility - - - -  Follow up Falls evaluation completed - - - -   Functional Status Survey:    Vitals:   06/16/16 1046  BP: 114/62  Pulse: 76  Resp: 18  Temp: 98.4 F (36.9 C)  TempSrc: Oral  SpO2: 91%  Weight: 132 lb 9.6 oz (60.1 kg)  Height: 5\' 1"  (1.549 m)   Body mass index is 25.05 kg/m. Physical Exam  Constitutional: She is oriented to person, place, and time. She appears well-developed and well-nourished. No distress.  HENT:  Head: Normocephalic and atraumatic.  Dry mucus membrane  Eyes: Pupils are equal, round, and reactive to light.  Neck: Normal range of motion. Neck supple.  Cardiovascular: Normal rate and regular rhythm.   Pulmonary/Chest: Effort normal and breath sounds normal.  Abdominal: Soft. Bowel sounds are normal. She exhibits no distension. There is no tenderness. There is no guarding.  Musculoskeletal: Normal range of motion. She exhibits edema.  Lymphadenopathy:    She has no cervical adenopathy.  Neurological: She is alert and oriented to person, place, and time.  Skin: Skin is warm and dry. She is not diaphoretic.  Psychiatric: She has a normal mood and affect.  Her behavior is normal.    Labs reviewed:  Recent Labs  01/19/16 0800  02/18/16 1204  04/26/16 1412 05/20/16 1658 05/24/16 1504 06/15/16  NA 134*  < > 129*  < > 135* 134* 137 129*  K 4.5  < > 4.2  < > 4.5 4.1 4.1 4.7  CL 98  --  98*  --   --  100*  --   --   CO2 28  < > 23  < > 27 28 27   --   GLUCOSE 107*  < > 108*  < > 125 111* 101  --   BUN 15  < > 13  < > 11.3 15 15.3 13  CREATININE 0.81  < > 0.71  < > 0.9 0.81 1.0 0.8  CALCIUM 9.5  < > 8.9  < > 7.7* 7.6* 8.5  --   < > = values in this interval not displayed.  Recent Labs  04/26/16 1412 05/20/16 1658 05/24/16 1504 06/15/16  AST 36* 39 36* 24  ALT 25 21 22 13   ALKPHOS 264* 137* 154* 137*  BILITOT 0.37 0.3 0.36  --   PROT 6.2* 5.9* 6.1*  --   ALBUMIN 3.4* 3.2* 3.2*  --     Recent Labs  04/26/16 1411 05/20/16 1658 05/24/16 1504 06/15/16  WBC 6.5 6.8 7.2 7.8  NEUTROABS 3.3 3.1 3.3  --   HGB 11.3* 10.9* 11.5* 11.0*  HCT 34.6* 34.1* 36.0 34*  MCV 92.4 91.9 92.5  --   PLT 230 235 235 404*   Lab Results  Component Value Date   TSH 3.215 08/25/2015   Lab Results  Component Value Date   HGBA1C 5.9 01/23/2015   No results found for: CHOL, HDL, LDLCALC, LDLDIRECT, TRIG, CHOLHDL  Significant Diagnostic Results in last 30 days:  No results found.   Assessment/Plan   Hyponatremia Appears chronic and multifactorial. Poor po intake and hydration could be contributing to this along with her being on diuretics. Another possibility is of SIADH with her malignancy with metastases and recent history of pneumonia. Monitor for clinical symptom. Decrease her lasix to 20 mg daily and monitor.   Leg edema Appears stable per resident.  Advised to keep legs elevated at rest. Echocardiogram 2014 shows normal EF. Decrease lasix to 20 mg daily for now. Check weight on weekly basis for 1 month and monitor leg swelling.   Wt Readings from Last 3 Encounters:  06/16/16 132 lb 9.6 oz (60.1 kg)  06/08/16 132 lb 9.6 oz (60.1 kg)    06/04/16 132 lb 9.6 oz (60.1 kg)    Family/ staff Communication: reviewed care plan with patient and charge nurse  Labs/tests ordered:  Bmp in 1 week

## 2016-06-18 ENCOUNTER — Non-Acute Institutional Stay: Payer: Medicare Other | Admitting: Family

## 2016-06-18 ENCOUNTER — Encounter: Payer: Self-pay | Admitting: Family

## 2016-06-18 ENCOUNTER — Encounter: Payer: Self-pay | Admitting: *Deleted

## 2016-06-18 DIAGNOSIS — N3000 Acute cystitis without hematuria: Secondary | ICD-10-CM | POA: Diagnosis not present

## 2016-06-18 MED ORDER — SACCHAROMYCES BOULARDII 250 MG PO CAPS: 250.0000 mg | ORAL_CAPSULE | Freq: Two times a day (BID) | ORAL | Status: DC

## 2016-06-18 MED ORDER — CIPROFLOXACIN HCL 500 MG PO TABS: 500.0000 mg | ORAL_TABLET | Freq: Two times a day (BID) | ORAL | 0 refills | Status: AC

## 2016-06-18 NOTE — Progress Notes (Signed)
Location:  [facility] Room Number: 8 Place of Service:  ALF [phone]) Provider: Jeniya Flannigan FNP-C  Blanchie Serve, MD  Patient Care Team: Blanchie Serve, MD as PCP - General (Internal Medicine) Melina Modena, [facility], Man X, NP as Nurse Practitioner (Nurse Practitioner) Shon Hough, MD as Consulting Physician (Ophthalmology) Molli Posey, MD as Consulting Physician (Obstetrics and Gynecology) Gerarda Fraction, MD as Consulting Physician (Ophthalmology) Lorelle Gibbs, MD as Consulting Physician (Radiology) Melrose Nakayama, MD as Consulting Physician (Orthopedic Surgery) Gerarda Fraction, MD as Referring Physician (Ophthalmology) Ardis Hughs, MD as Attending Physician (Urology) Magrinat, Virgie Dad, MD as Consulting Physician (Oncology)  Extended Emergency Contact Information Primary Emergency Contact: Black,Emily Address: [address]          [place], [place] [address] [name] of [place] Phone: [phone] Mobile Phone: [phone] Relation: Daughter Secondary Emergency Contact: Marcine Matar [place] of [place] Mobile Phone: [phone] Relation: Daughter  Code Status: DNR  Goals of care: Advanced Directive information Advanced Directives 06/18/2016  Does Patient Have a Medical Advance Directive? Yes  Type of Advance Directive Out of facility DNR (pink MOST or yellow form);Living will;Healthcare Power of Attorney  Does patient want to make changes to medical advance directive? -  Copy of [place] in Chart? Yes  Would patient like information on creating a medical advance directive? -  Pre-existing out of facility DNR order (yellow form or pink MOST form) Yellow form placed in chart (order not valid for inpatient use)     Chief Complaint  Patient presents with  . Acute Visit    UTI    HPI:  Pt is a 83 y.o. female seen today at [facility] for an acute visit for evaluation of abnormal  labs.She has a medical history of HTN,metastatic breast cancer, Lung mass, GERD, IBS, Urinary retention, Anxiety among other conditions. She is seen in her room today. She complains of feeling tired. Her recent urine specimen results showed yellow urine with nitrites negative. Urine cultures showed > 100,000 colonies of Klebsiella Pneumoniae and Pseudomonas aeruginosa sensitivity to cipro. Patient's allergy list Cipro, Bactrim/Triam indicated as allergies though patient states not allergic to above medication.Of note she is currently onTrimpex for UTI prophylaxis. She denies any fever or chills.    Past Medical History:  Diagnosis Date  . Abnormal PET scan of mediastinum 11/11/2015  . Abnormalities of the hair 02/29/2012  . Arthritis   . BPV (benign positional vertigo)   . Cancer ([place])   . Cancer antigen 125 (CA 125) elevation 11/27/2013  . Candidiasis of skin and nails 03/19/2011  . 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 Chorioretinal scar, macular 01/23/2015  . Closed fracture of base of neck of femur ([place]) 03/18/2011  . Compression fracture of thoracic vertebra ([facility]) 11/24/2012  . Contact dermatitis and other eczema, due to unspecified cause 05/07/2011  . Depression 07/23/2014  . Diverticulosis of colon (without mention of hemorrhage) 03/19/2011  . Edema 05/07/2011  . HOH (hard of hearing)   . Hypertension 02/24/2016  . Hypopotassemia 03/26/2011  . Hyposmolality and/or hyponatremia 03/19/2011  . Hypotension, unspecified 03/18/2011  . IBS (irritable bowel syndrome) 11/27/2013  . Idiopathic scoliosis 01/27/2016  . Impaired fasting glucose 03/19/2011  . Insomnia, unspecified 03/19/2011  . Kidney infection   . Lumbago 03/19/2011  . Macular degeneration   . Malignant neoplasm of breast (female), unspecified site 10/28/2002  . Mass of right lung 10/23/2015  . Metastasis to adrenal gland ([place]) 11/21/2015  Breast cancer  . Metastatic breast cancer ([place]) 10/27/2004  . Nonexudative  age-related macular degeneration 12/10/2010  . Osteoporosis 08/06/2014  . Osteoporosis, unspecified 03/19/2011  . Other malaise and fatigue 03/19/2011  . Pain in joint, pelvic region and thigh 03/19/2011  . Primary osteoarthritis of right hip 05/28/2014   TOTAL HIP ARTHROPLASTY ANTERIOR APPROACH HARDWARE REMOVAL on 05/28/2014   . Purpura senilis ([place]) 07/31/2013  . Radiculopathy, cervical 03/23/2016   Right C6  . Rib fractures   . Right bundle branch block 08/30/2011  . Shortness of breath   . Spasm of muscle 03/19/2011  . Spinal stenosis, lumbar region, with neurogenic claudication 11/02/2011  . TMJ click 5/63/1497  . Trigger finger (acquired) 11/02/2011  . Unspecified constipation 03/19/2011  . Urinary frequency 04/24/2013  . UTI (urinary tract infection) 04/16/2013   04/16/13 pseudomonas aeruginosa: tx with Cipro 06/15/14 P. Aeruginosa [name] 500mg  IM q12h x 7 days.     . Vaginitis and vulvovaginitis 02/29/2012  . Vertigo 08/26/2015   Past Surgical History:  Procedure Laterality Date  . BREAST SURGERY Bilateral 2005-10/27/2004   lumpectomy- Streck,MD  . CATARACT EXTRACTION  2010   bialteral  . EYE SURGERY     cataract  . FRACTURE SURGERY Left 09/1980   ankle  . HARDWARE REMOVAL Right 05/28/2014   Procedure: HARDWARE REMOVAL;  Surgeon: Melrose Nakayama, MD;  Location: [facility];  Service: Orthopedics;  Laterality: Right;  . HIP PINNING,CANNULATED  03/08/2011   Procedure: CANNULATED HIP PINNING;  Surgeon: Johnn Hai, MD;  Location: [facility];  Service: Orthopedics;  Laterality: Right;  . ORIF HIP FRACTURE Right 03/08/2011   Bean,MD  . SKIN CANCER EXCISION Left 10/12/2012   lower leg Dr. [place]  . SKIN LESION EXCISION Right 02/04/2011   Abdomen lesion spongiotic dermatitis-Taffeen, MD  . SQUAMOUS CELL CARCINOMA EXCISION Right 02/04/2011   forearmSyble Creek, MD  . TONSILLECTOMY    . TOTAL HIP ARTHROPLASTY Right 05/28/2014   Procedure: TOTAL HIP ARTHROPLASTY ANTERIOR APPROACH;  Surgeon: Melrose Nakayama, MD;  Location: [facility];  Service: Orthopedics;  Laterality: Right;    Allergies  Allergen Reactions  . Ciprofloxacin Other (See Comments)    Reaction: Per patient not allergic to cipro   . Macrodantin [Nitrofurantoin] Other (See Comments)    Reaction:  Unknown   . Trimethoprim Other (See Comments)    Reaction:  Unknown   . Bactrim [Sulfamethoxazole-Trimethoprim] Other (See Comments)    Reaction:  Unknown     Allergies as of 06/18/2016      Reactions   Ciprofloxacin Other (See Comments)   Reaction: Per patient not allergic to cipro    Macrodantin [nitrofurantoin] Other (See Comments)   Reaction:  Unknown    Trimethoprim Other (See Comments)   Reaction:  Unknown    Bactrim [sulfamethoxazole-trimethoprim] Other (See Comments)   Reaction:  Unknown       Medication List       Accurate as of 06/18/16  1:44 PM. Always use your most recent med list.          acetaminophen 500 MG tablet Commonly known as:  TYLENOL Take 500 mg by mouth every 6 (six) hours as needed for moderate pain.   ALPRAZolam 0.5 MG tablet Commonly known as:  XANAX Take 0.5 mg by mouth daily. Pt is also able to use twice daily as needed for anxiety.   anastrozole 1 MG tablet Commonly known as:  ARIMIDEX Take 1 tablet (1 mg total) by mouth daily.   antiseptic  oral rinse Liqd 15 mLs by Mouth Rinse route 2 (two) times daily as needed for dry mouth.   aspirin 81 MG chewable tablet Chew 81 mg by mouth daily.   Bilberry 150 MG Caps Take 150 mg by mouth daily.   CALAGEL MAXIMUM STRENGTH EX Apply 1 application topically as directed.   CALCIUM 600+D 600-400 MG-UNIT tablet Generic drug:  Calcium Carbonate-Vitamin D Take 1 tablet by mouth 2 (two) times daily.   Cholecalciferol 1000 units tablet Take 1,000 Units by mouth daily.   diphenhydrAMINE-zinc acetate cream Commonly known as:  BENADRYL Apply 1 application topically 2 (two) times daily.   Fish Oil 1000 MG Caps Take 1 capsule by mouth  daily.   furosemide 40 MG tablet Commonly known as:  LASIX Take 20 mg by mouth daily.   GLUCOSAMINE CHONDR COMPLEX PO Take 2 tablets by mouth daily. for joints   guaiFENesin 600 MG 12 hr tablet Commonly known as:  MUCINEX Take 600 mg by mouth 2 (two) times daily as needed for cough.   ibuprofen 400 MG tablet Commonly known as:  ADVIL,MOTRIN Take 400 mg by mouth as needed.   loperamide 2 MG tablet Commonly known as:  IMODIUM A-D Take 4 mg by mouth as needed for diarrhea or loose stools. Give 4 mg by mouth as the initial dose, then 2 mg by mouth after each loose stool.   meclizine 25 MG tablet Commonly known as:  ANTIVERT Take 1 tablet (25 mg total) by mouth 3 (three) times daily as needed for dizziness.   mirtazapine 7.5 MG tablet Commonly known as:  REMERON Take 7.5 mg by mouth at bedtime.   NON FORMULARY 1 bottle of gatorade as needed at residents request   nystatin powder Commonly known as:  MYCOSTATIN/NYSTOP Apply topically daily. 1 application to bilateral breast (under)   omeprazole 20 MG capsule Commonly known as:  PRILOSEC Take 20 mg by mouth 2 (two) times daily before a meal.   ondansetron 4 MG disintegrating tablet Commonly known as:  ZOFRAN-ODT Take 4 mg by mouth every 8 (eight) hours as needed for nausea or vomiting.   polycarbophil 625 MG tablet Commonly known as:  FIBERCON Take 625 mg by mouth 2 (two) times daily.   polyethylene glycol packet Commonly known as:  MIRALAX / GLYCOLAX Take 17 g by mouth at bedtime. Pt also uses once daily as needed for constipation.   PRESERVISION/LUTEIN Caps Take 1 capsule by mouth 2 (two) times daily.   SUPER CRANBERRY/VITAMIN D3 4200-500 MG-UNIT Caps Generic drug:  Cranberry-Cholecalciferol Take 1 capsule by mouth daily.   traMADol 50 MG tablet Commonly known as:  ULTRAM Take 50 mg by mouth 4 (four) times daily -  with meals and at bedtime.   trimethoprim 100 MG tablet Commonly known as:  TRIMPEX Take 100  mg by mouth at bedtime.   URIBEL 118 MG Caps Take 1 capsule by mouth 3 (three) times daily as needed.   zolpidem 5 MG tablet Commonly known as:  AMBIEN Take 5 mg by mouth at bedtime as needed for sleep.       Review of Systems  Constitutional: Positive for fatigue. Negative for activity change, appetite change, chills and fever.  HENT: Negative for congestion, rhinorrhea, sinus pain, sinus pressure, sneezing and sore throat.   Eyes: Negative.   Respiratory: Negative for cough, chest tightness, shortness of breath and wheezing.   Cardiovascular: Negative for chest pain, palpitations and leg swelling.  Gastrointestinal: Negative for abdominal distention, abdominal  pain, constipation, diarrhea, nausea and vomiting.  Genitourinary: Negative for frequency and urgency.       Does self in and out cath twice daily  Musculoskeletal: Positive for gait problem.  Skin: Negative for color change, pallor and rash.  Neurological: Negative for dizziness, seizures, syncope, light-headedness and headaches.  Hematological: Does not bruise/bleed easily.  Psychiatric/Behavioral: Negative for agitation, confusion, hallucinations and sleep disturbance. The patient is not nervous/anxious.     Immunization History  Administered Date(s) Administered  . Influenza Whole 10/12/2011, 10/12/2012  . Influenza-Unspecified 10/25/2013, 10/10/2014, 12/12/2015  . PPD Test 07/07/2010, 03/24/2016  . Pneumococcal Conjugate-13 01/12/2003  . Zoster 01/12/2007   Pertinent  Health Maintenance Due  Topic Date Due  . PNA vac Low Risk Adult (2 of 2 - PPSV23) 01/12/2004  . INFLUENZA VACCINE  08/11/2016  . MAMMOGRAM  08/11/2016  . DEXA SCAN  Completed   Fall Risk  11/11/2015 10/16/2015 07/03/2015 01/28/2015 10/22/2014  Falls in the past year? Yes No No No No  Number falls in past yr: 1 - - - -  Injury with Fall? Yes - - - -  Risk Factor Category  High Fall Risk - - - -  Risk for fall due to : History of fall(s);Impaired  balance/gait;Impaired mobility - - - -  Follow up Falls evaluation completed - - - -    Vitals:   06/18/16 1151  BP: (!) 155/93  Pulse: 88  Resp: 18  Temp: 98.2 F (36.8 C)  SpO2: 91%  Weight: 126 lb 8 oz (57.4 kg)  Height: 5\' 1"  (1.549 m)   Body mass index is 23.9 kg/m. Physical Exam  Constitutional: She is oriented to person, place, and time. She appears well-developed and well-nourished. No distress.  HENT:  Head: Normocephalic.  Mouth/Throat: Oropharynx is clear and moist. No oropharyngeal exudate.  Eyes: Conjunctivae and EOM are normal. Pupils are equal, round, and reactive to light. Right eye exhibits no discharge. Left eye exhibits no discharge. No scleral icterus.  Neck: Normal range of motion. No JVD present. No thyromegaly present.  Cardiovascular: Normal rate, regular rhythm, normal heart sounds and intact distal pulses.  Exam reveals no gallop and no friction rub.   No murmur heard. Pulmonary/Chest: Effort normal and breath sounds normal. No respiratory distress. She has no wheezes. She has no rales.  Abdominal: Soft. Bowel sounds are normal. She exhibits no distension. There is no tenderness. There is no rebound and no guarding.  Slight Suprapubic Tenderness to palpation   Musculoskeletal: She exhibits no edema or tenderness.  Moves x 4 extremities.    Lymphadenopathy:    She has no cervical adenopathy.  Neurological: She is oriented to person, place, and time.  Skin: Skin is warm and dry. No rash noted. No erythema. No pallor.  Psychiatric: She has a normal mood and affect.    Labs reviewed:  Recent Labs  01/19/16 0800  02/18/16 1204  04/26/16 1412 05/20/16 1658 05/24/16 1504 06/15/16  NA 134*  < > 129*  < > 135* 134* 137 129*  K 4.5  < > 4.2  < > 4.5 4.1 4.1 4.7  CL 98  --  98*  --   --  100*  --   --   CO2 28  < > 23  < > 27 28 27   --   GLUCOSE 107*  < > 108*  < > 125 111* 101  --   BUN 15  < > 13  < > 11.3 15  15.3 13  CREATININE 0.81  < > 0.71  <  > 0.9 0.81 1.0 0.8  CALCIUM 9.5  < > 8.9  < > 7.7* 7.6* 8.5  --   < > = values in this interval not displayed.  Recent Labs  04/26/16 1412 05/20/16 1658 05/24/16 1504 06/15/16  AST 36* 39 36* 24  ALT 25 21 22 13   ALKPHOS 264* 137* 154* 137*  BILITOT 0.37 0.3 0.36  --   PROT 6.2* 5.9* 6.1*  --   ALBUMIN 3.4* 3.2* 3.2*  --     Recent Labs  04/26/16 1411 05/20/16 1658 05/24/16 1504 06/15/16  WBC 6.5 6.8 7.2 7.8  NEUTROABS 3.3 3.1 3.3  --   HGB 11.3* 10.9* 11.5* 11.0*  HCT 34.6* 34.1* 36.0 34*  MCV 92.4 91.9 92.5  --   PLT 230 235 235 404*   Lab Results  Component Value Date   TSH 3.215 08/25/2015   Lab Results  Component Value Date   HGBA1C 5.9 01/23/2015   Assessment/Plan  Acute cystitis without hematuria  Afebrile.urine specimen results showed yellow urine with nitrites negative. Urine cultures showed > 100,000 colonies of Klebsiella Pneumoniae and Pseudomonas aeruginosa sensitivity to cipro. Patient's allergy list Cipro, Bactrim/Triam indicated as allergies though patient states not allergic to medication.Of note she is currently onTrimpex for UTI prophylaxis. Patient's medication allergy list confirmed with patient's POA by Nurse supervisor. Per POA patient not allergic to medication had nausea only. Will discontinue from allergy list. Start Cipro 500 mg Tablet by mouth twice daily x 7 days. Add Florastor 250 mg Capsule twice daily X 10 days. Continue to monitor.    Family/ staff Communication: Reviewed plan of care with patient and facility Nurse supervisor  Labs/tests ordered: None   Sandrea Hughs, NP

## 2016-06-21 ENCOUNTER — Other Ambulatory Visit (HOSPITAL_BASED_OUTPATIENT_CLINIC_OR_DEPARTMENT_OTHER): Payer: Medicare Other

## 2016-06-21 ENCOUNTER — Ambulatory Visit (HOSPITAL_BASED_OUTPATIENT_CLINIC_OR_DEPARTMENT_OTHER): Payer: Medicare Other

## 2016-06-21 VITALS — BP 159/69 | HR 81 | Temp 97.7°F | Resp 18

## 2016-06-21 DIAGNOSIS — G893 Neoplasm related pain (acute) (chronic): Principal | ICD-10-CM

## 2016-06-21 DIAGNOSIS — C7972 Secondary malignant neoplasm of left adrenal gland: Secondary | ICD-10-CM

## 2016-06-21 DIAGNOSIS — C50911 Malignant neoplasm of unspecified site of right female breast: Secondary | ICD-10-CM

## 2016-06-21 DIAGNOSIS — C7971 Secondary malignant neoplasm of right adrenal gland: Secondary | ICD-10-CM

## 2016-06-21 DIAGNOSIS — Z170 Estrogen receptor positive status [ER+]: Principal | ICD-10-CM

## 2016-06-21 DIAGNOSIS — C7951 Secondary malignant neoplasm of bone: Secondary | ICD-10-CM

## 2016-06-21 DIAGNOSIS — M810 Age-related osteoporosis without current pathological fracture: Secondary | ICD-10-CM

## 2016-06-21 DIAGNOSIS — C50919 Malignant neoplasm of unspecified site of unspecified female breast: Secondary | ICD-10-CM

## 2016-06-21 DIAGNOSIS — M818 Other osteoporosis without current pathological fracture: Secondary | ICD-10-CM

## 2016-06-21 DIAGNOSIS — S2242XG Multiple fractures of ribs, left side, subsequent encounter for fracture with delayed healing: Secondary | ICD-10-CM

## 2016-06-21 LAB — CBC WITH DIFFERENTIAL/PLATELET
BASO%: 0.7 % (ref 0.0–2.0)
BASOS ABS: 0 10*3/uL (ref 0.0–0.1)
EOS ABS: 0.9 10*3/uL — AB (ref 0.0–0.5)
EOS%: 12.2 % — ABNORMAL HIGH (ref 0.0–7.0)
HEMATOCRIT: 36.3 % (ref 34.8–46.6)
HEMOGLOBIN: 11.8 g/dL (ref 11.6–15.9)
LYMPH%: 29.1 % (ref 14.0–49.7)
MCH: 28.9 pg (ref 25.1–34.0)
MCHC: 32.5 g/dL (ref 31.5–36.0)
MCV: 88.9 fL (ref 79.5–101.0)
MONO#: 0.8 10*3/uL (ref 0.1–0.9)
MONO%: 10.8 % (ref 0.0–14.0)
NEUT%: 47.2 % (ref 38.4–76.8)
NEUTROS ABS: 3.3 10*3/uL (ref 1.5–6.5)
Platelets: 245 10*3/uL (ref 145–400)
RBC: 4.09 10*6/uL (ref 3.70–5.45)
RDW: 16.2 % — AB (ref 11.2–14.5)
WBC: 7.1 10*3/uL (ref 3.9–10.3)
lymph#: 2.1 10*3/uL (ref 0.9–3.3)

## 2016-06-21 LAB — COMPREHENSIVE METABOLIC PANEL
ALK PHOS: 180 U/L — AB (ref 40–150)
ALT: 26 U/L (ref 0–55)
AST: 47 U/L — AB (ref 5–34)
Albumin: 2.9 g/dL — ABNORMAL LOW (ref 3.5–5.0)
Anion Gap: 9 mEq/L (ref 3–11)
BILIRUBIN TOTAL: 0.33 mg/dL (ref 0.20–1.20)
BUN: 11.3 mg/dL (ref 7.0–26.0)
CALCIUM: 8.8 mg/dL (ref 8.4–10.4)
CO2: 27 mEq/L (ref 22–29)
Chloride: 99 mEq/L (ref 98–109)
Creatinine: 0.9 mg/dL (ref 0.6–1.1)
EGFR: 56 mL/min/{1.73_m2} — AB (ref >90)
GLUCOSE: 117 mg/dL (ref 70–140)
POTASSIUM: 4.4 meq/L (ref 3.5–5.1)
SODIUM: 134 meq/L — AB (ref 136–145)
TOTAL PROTEIN: 5.9 g/dL — AB (ref 6.4–8.3)

## 2016-06-21 MED ORDER — DENOSUMAB 120 MG/1.7ML ~~LOC~~ SOLN
120.0000 mg | Freq: Once | SUBCUTANEOUS | Status: AC
Start: 2016-06-21 — End: 2016-06-21
  Administered 2016-06-21: 120 mg via SUBCUTANEOUS
  Filled 2016-06-21: qty 1.7

## 2016-06-21 NOTE — Patient Instructions (Signed)

## 2016-06-22 LAB — CANCER ANTIGEN 27.29: CA 27.29: 214.7 U/mL — ABNORMAL HIGH (ref 0.0–38.6)

## 2016-06-23 ENCOUNTER — Encounter: Payer: Self-pay | Admitting: Internal Medicine

## 2016-06-24 DIAGNOSIS — E871 Hypo-osmolality and hyponatremia: Secondary | ICD-10-CM | POA: Diagnosis not present

## 2016-06-24 LAB — BASIC METABOLIC PANEL
BUN: 9 (ref 4–21)
Creatinine: 0.7 (ref 0.5–1.1)
GLUCOSE: 115
Potassium: 4.5 (ref 3.4–5.3)
Sodium: 134 — AB (ref 137–147)

## 2016-06-28 ENCOUNTER — Other Ambulatory Visit: Payer: Self-pay | Admitting: Oncology

## 2016-07-01 ENCOUNTER — Ambulatory Visit (INDEPENDENT_AMBULATORY_CARE_PROVIDER_SITE_OTHER): Payer: Medicare Other | Admitting: Podiatry

## 2016-07-01 ENCOUNTER — Encounter: Payer: Self-pay | Admitting: Podiatry

## 2016-07-01 DIAGNOSIS — B351 Tinea unguium: Secondary | ICD-10-CM | POA: Diagnosis not present

## 2016-07-01 DIAGNOSIS — M79676 Pain in unspecified toe(s): Secondary | ICD-10-CM

## 2016-07-01 NOTE — Progress Notes (Signed)
Complaint:  Visit Type: Patient returns to my office for continued preventative foot care services. Complaint: Patient states" my nails have grown long and thick and become painful to walk and wear shoes" Patient has been diagnosed with DM with no foot complications. The patient presents for preventative foot care services. No changes to ROS  Podiatric Exam: Vascular: dorsalis pedis and posterior tibial pulses are palpable bilateral. Capillary return is immediate. Temperature gradient is WNL. Skin turgor WNL  Sensorium: Normal Semmes Weinstein monofilament test. Normal tactile sensation bilaterally. Nail Exam: Pt has thick disfigured discolored nails with subungual debris noted bilateral entire nail hallux through fifth toenails Ulcer Exam: There is no evidence of ulcer or pre-ulcerative changes or infection. Orthopedic Exam: Muscle tone and strength are WNL. No limitations in general ROM. No crepitus or effusions noted. Foot type and digits show no abnormalities. Bony prominences are unremarkable. Skin: No Porokeratosis. No infection or ulcers  Diagnosis:  Onychomycosis, , Pain in right toe, pain in left toes. Pincer toenails  Treatment & Plan Procedures and Treatment: Consent by patient was obtained for treatment procedures. The patient understood the discussion of treatment and procedures well. All questions were answered thoroughly reviewed. Debridement of mycotic and hypertrophic toenails, 1 through 5 bilateral and clearing of subungual debris. No ulceration, no infection noted.  Return Visit-Office Procedure: Patient instructed to return to the office for a follow up visit 3 months for continued evaluation and treatment.    Danae Oland DPM 

## 2016-07-19 ENCOUNTER — Other Ambulatory Visit (HOSPITAL_BASED_OUTPATIENT_CLINIC_OR_DEPARTMENT_OTHER): Payer: Medicare Other

## 2016-07-19 ENCOUNTER — Ambulatory Visit (HOSPITAL_BASED_OUTPATIENT_CLINIC_OR_DEPARTMENT_OTHER): Payer: Medicare Other

## 2016-07-19 VITALS — BP 131/79 | HR 88 | Temp 97.4°F | Resp 20

## 2016-07-19 DIAGNOSIS — M810 Age-related osteoporosis without current pathological fracture: Secondary | ICD-10-CM

## 2016-07-19 DIAGNOSIS — C7972 Secondary malignant neoplasm of left adrenal gland: Secondary | ICD-10-CM

## 2016-07-19 DIAGNOSIS — C7951 Secondary malignant neoplasm of bone: Secondary | ICD-10-CM

## 2016-07-19 DIAGNOSIS — G893 Neoplasm related pain (acute) (chronic): Secondary | ICD-10-CM | POA: Diagnosis not present

## 2016-07-19 DIAGNOSIS — C50911 Malignant neoplasm of unspecified site of right female breast: Secondary | ICD-10-CM

## 2016-07-19 DIAGNOSIS — S2242XG Multiple fractures of ribs, left side, subsequent encounter for fracture with delayed healing: Secondary | ICD-10-CM

## 2016-07-19 DIAGNOSIS — C50919 Malignant neoplasm of unspecified site of unspecified female breast: Secondary | ICD-10-CM

## 2016-07-19 DIAGNOSIS — Z170 Estrogen receptor positive status [ER+]: Principal | ICD-10-CM

## 2016-07-19 DIAGNOSIS — M818 Other osteoporosis without current pathological fracture: Secondary | ICD-10-CM

## 2016-07-19 LAB — COMPREHENSIVE METABOLIC PANEL
ALT: 36 U/L (ref 0–55)
AST: 65 U/L — AB (ref 5–34)
Albumin: 3 g/dL — ABNORMAL LOW (ref 3.5–5.0)
Alkaline Phosphatase: 257 U/L — ABNORMAL HIGH (ref 40–150)
Anion Gap: 6 mEq/L (ref 3–11)
BILIRUBIN TOTAL: 0.41 mg/dL (ref 0.20–1.20)
BUN: 14.1 mg/dL (ref 7.0–26.0)
CALCIUM: 8.8 mg/dL (ref 8.4–10.4)
CHLORIDE: 98 meq/L (ref 98–109)
CO2: 28 mEq/L (ref 22–29)
CREATININE: 1 mg/dL (ref 0.6–1.1)
EGFR: 51 mL/min/{1.73_m2} — ABNORMAL LOW (ref >90)
Glucose: 128 mg/dl (ref 70–140)
Potassium: 4.2 mEq/L (ref 3.5–5.1)
Sodium: 132 mEq/L — ABNORMAL LOW (ref 136–145)
Total Protein: 6 g/dL — ABNORMAL LOW (ref 6.4–8.3)

## 2016-07-19 LAB — CBC WITH DIFFERENTIAL/PLATELET
BASO%: 0.4 % (ref 0.0–2.0)
Basophils Absolute: 0 10*3/uL (ref 0.0–0.1)
EOS%: 3.7 % (ref 0.0–7.0)
Eosinophils Absolute: 0.3 10*3/uL (ref 0.0–0.5)
HEMATOCRIT: 35.6 % (ref 34.8–46.6)
HEMOGLOBIN: 11.2 g/dL — AB (ref 11.6–15.9)
LYMPH#: 2.6 10*3/uL (ref 0.9–3.3)
LYMPH%: 38.3 % (ref 14.0–49.7)
MCH: 28.1 pg (ref 25.1–34.0)
MCHC: 31.5 g/dL (ref 31.5–36.0)
MCV: 89.4 fL (ref 79.5–101.0)
MONO#: 0.9 10*3/uL (ref 0.1–0.9)
MONO%: 12.8 % (ref 0.0–14.0)
NEUT%: 44.8 % (ref 38.4–76.8)
NEUTROS ABS: 3 10*3/uL (ref 1.5–6.5)
Platelets: 214 10*3/uL (ref 145–400)
RBC: 3.98 10*6/uL (ref 3.70–5.45)
RDW: 16.8 % — ABNORMAL HIGH (ref 11.2–14.5)
WBC: 6.7 10*3/uL (ref 3.9–10.3)

## 2016-07-19 MED ORDER — DENOSUMAB 120 MG/1.7ML ~~LOC~~ SOLN
120.0000 mg | Freq: Once | SUBCUTANEOUS | Status: AC
  Administered 2016-07-19: 120 mg via SUBCUTANEOUS
  Filled 2016-07-19: qty 1.7

## 2016-07-19 NOTE — Patient Instructions (Signed)

## 2016-07-20 LAB — CANCER ANTIGEN 27.29: CA 27.29: 304.4 U/mL — ABNORMAL HIGH (ref 0.0–38.6)

## 2016-07-22 ENCOUNTER — Other Ambulatory Visit: Payer: Self-pay | Admitting: Oncology

## 2016-07-22 DIAGNOSIS — C50919 Malignant neoplasm of unspecified site of unspecified female breast: Secondary | ICD-10-CM

## 2016-07-22 DIAGNOSIS — C7970 Secondary malignant neoplasm of unspecified adrenal gland: Secondary | ICD-10-CM

## 2016-07-26 DIAGNOSIS — K5790 Diverticulosis of intestine, part unspecified, without perforation or abscess without bleeding: Secondary | ICD-10-CM | POA: Diagnosis not present

## 2016-07-26 DIAGNOSIS — K5730 Diverticulosis of large intestine without perforation or abscess without bleeding: Secondary | ICD-10-CM | POA: Diagnosis not present

## 2016-07-26 DIAGNOSIS — E871 Hypo-osmolality and hyponatremia: Secondary | ICD-10-CM | POA: Diagnosis not present

## 2016-07-26 DIAGNOSIS — K5710 Diverticulosis of small intestine without perforation or abscess without bleeding: Secondary | ICD-10-CM | POA: Diagnosis not present

## 2016-07-27 ENCOUNTER — Non-Acute Institutional Stay: Payer: Medicare Other | Admitting: Family

## 2016-07-27 DIAGNOSIS — R5383 Other fatigue: Secondary | ICD-10-CM | POA: Diagnosis not present

## 2016-07-27 DIAGNOSIS — D649 Anemia, unspecified: Secondary | ICD-10-CM | POA: Diagnosis not present

## 2016-07-27 DIAGNOSIS — D500 Iron deficiency anemia secondary to blood loss (chronic): Secondary | ICD-10-CM | POA: Diagnosis not present

## 2016-07-27 DIAGNOSIS — R3989 Other symptoms and signs involving the genitourinary system: Secondary | ICD-10-CM | POA: Diagnosis not present

## 2016-07-27 DIAGNOSIS — D508 Other iron deficiency anemias: Secondary | ICD-10-CM | POA: Diagnosis not present

## 2016-07-27 DIAGNOSIS — N390 Urinary tract infection, site not specified: Secondary | ICD-10-CM | POA: Diagnosis not present

## 2016-07-27 DIAGNOSIS — D7289 Other specified disorders of white blood cells: Secondary | ICD-10-CM | POA: Diagnosis not present

## 2016-07-27 DIAGNOSIS — D509 Iron deficiency anemia, unspecified: Secondary | ICD-10-CM | POA: Diagnosis not present

## 2016-07-27 DIAGNOSIS — R0902 Hypoxemia: Secondary | ICD-10-CM

## 2016-07-27 DIAGNOSIS — R05 Cough: Secondary | ICD-10-CM | POA: Diagnosis not present

## 2016-07-27 NOTE — Progress Notes (Addendum)
Location:  [facility] Room Number: 8 Place of Service:  ALF [phone]) Provider: Parlee Amescua FNP-C  Blanchie Serve, MD  Patient Care Team: Blanchie Serve, MD as PCP - General (Internal Medicine) Melina Modena, [facility], Man X, NP as Nurse Practitioner (Nurse Practitioner) Shon Hough, MD as Consulting Physician (Ophthalmology) Molli Posey, MD as Consulting Physician (Obstetrics and Gynecology) Gerarda Fraction, MD as Consulting Physician (Ophthalmology) Lorelle Gibbs, MD as Consulting Physician (Radiology) Melrose Nakayama, MD as Consulting Physician (Orthopedic Surgery) Gerarda Fraction, MD as Referring Physician (Ophthalmology) Ardis Hughs, MD as Attending Physician (Urology) Magrinat, Virgie Dad, MD as Consulting Physician (Oncology)  Extended Emergency Contact Information Primary Emergency Contact: Black,Emily Address: [address]          [place], [place] [address] [name] of [place] Phone: [phone] Mobile Phone: [phone] Relation: Daughter Secondary Emergency Contact: Marcine Matar [place] of [place] Mobile Phone: [phone] Relation: Daughter  Code Status:  DNR Goals of care: Advanced Directive information Advanced Directives 06/18/2016  Does Patient Have a Medical Advance Directive? Yes  Type of Advance Directive Out of facility DNR (pink MOST or yellow form);Living will;Healthcare Power of Attorney  Does patient want to make changes to medical advance directive? -  Copy of [place] in Chart? Yes  Would patient like information on creating a medical advance directive? -  Pre-existing out of facility DNR order (yellow form or pink MOST form) Yellow form placed in chart (order not valid for inpatient use)     Chief Complaint  Patient presents with  . Acute Visit    " Not feeling well"     HPI:  Pt is a 83 y.o. female seen today at [facility] for an acute visit for  evaluation of fatigue. She is seen in her room today with Nurse at bedside. She complains of feeling tired, weak and " not feeling well" at all times. " I just don't want to do anything". She states has an occasional cough and short of breath at times.she continues to In and Out cath twice daily reports dark colored urine.she denies any fever, chills, wheezing, nausea or vomiting. No change in appetite reported. Of note she has a medical history of metastatic breast cancer among other conditions.    Addendum:  CBC results received Hgb 10.9 previous 11.2 , HCT 33.0 ( 07/27/2016).   Past Medical History:  Diagnosis Date  . Abnormal PET scan of mediastinum 11/11/2015  . Abnormalities of the hair 02/29/2012  . Arthritis   . BPV (benign positional vertigo)   . Cancer ([place])   . Cancer antigen 125 (CA 125) elevation 11/27/2013  . Candidiasis of skin and nails 03/19/2011  . 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 Chorioretinal scar, macular 01/23/2015  . Closed fracture of base of neck of femur ([place]) 03/18/2011  . Compression fracture of thoracic vertebra ([facility]) 11/24/2012  . Contact dermatitis and other eczema, due to unspecified cause 05/07/2011  . Depression 07/23/2014  . Diverticulosis of colon (without mention of hemorrhage) 03/19/2011  . Edema 05/07/2011  . HOH (hard of hearing)   . Hypertension 02/24/2016  . Hypopotassemia 03/26/2011  . Hyposmolality and/or hyponatremia 03/19/2011  . Hypotension, unspecified 03/18/2011  . IBS (irritable bowel syndrome) 11/27/2013  . Idiopathic scoliosis 01/27/2016  . Impaired fasting glucose 03/19/2011  . Insomnia, unspecified 03/19/2011  . Kidney infection   . Lumbago 03/19/2011  . Macular degeneration   . Malignant neoplasm of breast (female), unspecified site  10/28/2002  . Mass of right lung 10/23/2015  . Metastasis to adrenal gland ([facility]) 11/21/2015   Breast cancer  . Metastatic breast cancer ([facility] [facility]) 10/27/2004  . Nonexudative age-related macular  degeneration 12/10/2010  . Osteoporosis 08/06/2014  . Osteoporosis, unspecified 03/19/2011  . Other malaise and fatigue 03/19/2011  . Pain in joint, pelvic region and thigh 03/19/2011  . Primary osteoarthritis of right hip 05/28/2014   TOTAL HIP ARTHROPLASTY ANTERIOR APPROACH HARDWARE REMOVAL on 05/28/2014   . Purpura senilis ([facility]) 07/31/2013  . Radiculopathy, cervical 03/23/2016   Right C6  . Rib fractures   . Right bundle branch block 08/30/2011  . Shortness of breath   . Spasm of muscle 03/19/2011  . Spinal stenosis, lumbar region, with neurogenic claudication 11/02/2011  . TMJ click 5/85/2778  . Trigger finger (acquired) 11/02/2011  . Unspecified constipation 03/19/2011  . Urinary frequency 04/24/2013  . UTI (urinary tract infection) 04/16/2013   04/16/13 pseudomonas aeruginosa: tx with Cipro 06/15/14 P. Aeruginosa [name] 500mg  IM q12h x 7 days.     . Vaginitis and vulvovaginitis 02/29/2012  . Vertigo 08/26/2015   Past Surgical History:  Procedure Laterality Date  . BREAST SURGERY Bilateral 2005-10/27/2004   lumpectomy- Streck,MD  . CATARACT EXTRACTION  2010   bialteral  . EYE SURGERY     cataract  . FRACTURE SURGERY Left 09/1980   ankle  . HARDWARE REMOVAL Right 05/28/2014   Procedure: HARDWARE REMOVAL;  Surgeon: Melrose Nakayama, MD;  Location: [facility];  Service: Orthopedics;  Laterality: Right;  . HIP PINNING,CANNULATED  03/08/2011   Procedure: CANNULATED HIP PINNING;  Surgeon: Johnn Hai, MD;  Location: [facility];  Service: Orthopedics;  Laterality: Right;  . ORIF HIP FRACTURE Right 03/08/2011   Bean,MD  . SKIN CANCER EXCISION Left 10/12/2012   lower leg Dr. [place]  . SKIN LESION EXCISION Right 02/04/2011   Abdomen lesion spongiotic dermatitis-Taffeen, MD  . SQUAMOUS CELL CARCINOMA EXCISION Right 02/04/2011   forearmSyble Creek, MD  . TONSILLECTOMY    . TOTAL HIP ARTHROPLASTY Right 05/28/2014   Procedure: TOTAL HIP ARTHROPLASTY ANTERIOR APPROACH;  Surgeon: Melrose Nakayama, MD;   Location: [facility];  Service: Orthopedics;  Laterality: Right;    Allergies  Allergen Reactions  . Macrodantin [Nitrofurantoin] Other (See Comments)    Reaction:  Unknown     Allergies as of 07/27/2016      Reactions   Macrodantin [nitrofurantoin] Other (See Comments)   Reaction:  Unknown       Medication List       Accurate as of 07/27/16  3:43 PM. Always use your most recent med list.          acetaminophen 500 MG tablet Commonly known as:  TYLENOL Take 500 mg by mouth every 6 (six) hours as needed for moderate pain.   ALPRAZolam 0.5 MG tablet Commonly known as:  XANAX Take 0.5 mg by mouth daily. Pt is also able to use twice daily as needed for anxiety.   anastrozole 1 MG tablet Commonly known as:  ARIMIDEX Take 1 tablet (1 mg total) by mouth daily.   antiseptic oral rinse Liqd 15 mLs by Mouth Rinse route 2 (two) times daily as needed for dry mouth.   aspirin 81 MG chewable tablet Chew 81 mg by mouth daily.   Bilberry 150 MG Caps Take 150 mg by mouth daily.   CALAGEL MAXIMUM STRENGTH EX Apply 1 application topically as directed.   CALCIUM 600+D 600-400 MG-UNIT tablet Generic drug:  Calcium Carbonate-Vitamin  D Take 1 tablet by mouth 2 (two) times daily.   Cholecalciferol 1000 units tablet Take 1,000 Units by mouth daily.   diphenhydrAMINE-zinc acetate cream Commonly known as:  BENADRYL Apply 1 application topically 2 (two) times daily.   Fish Oil 1000 MG Caps Take 1 capsule by mouth daily.   furosemide 40 MG tablet Commonly known as:  LASIX Take 20 mg by mouth daily.   GLUCOSAMINE CHONDR COMPLEX PO Take 2 tablets by mouth daily. for joints   guaiFENesin 600 MG 12 hr tablet Commonly known as:  MUCINEX Take 600 mg by mouth 2 (two) times daily as needed for cough.   ibuprofen 400 MG tablet Commonly known as:  ADVIL,MOTRIN Take 400 mg by mouth as needed.   loperamide 2 MG tablet Commonly known as:  IMODIUM A-D Take 4 mg by mouth as needed for  diarrhea or loose stools. Give 4 mg by mouth as the initial dose, then 2 mg by mouth after each loose stool.   meclizine 25 MG tablet Commonly known as:  ANTIVERT Take 1 tablet (25 mg total) by mouth 3 (three) times daily as needed for dizziness.   mirtazapine 7.5 MG tablet Commonly known as:  REMERON Take 7.5 mg by mouth at bedtime.   NON FORMULARY 1 bottle of gatorade as needed at residents request   nystatin powder Commonly known as:  MYCOSTATIN/NYSTOP Apply topically daily. 1 application to bilateral breast (under)   omeprazole 20 MG capsule Commonly known as:  PRILOSEC Take 20 mg by mouth 2 (two) times daily before a meal.   ondansetron 4 MG disintegrating tablet Commonly known as:  ZOFRAN-ODT Take 4 mg by mouth every 8 (eight) hours as needed for nausea or vomiting.   polycarbophil 625 MG tablet Commonly known as:  FIBERCON Take 625 mg by mouth 2 (two) times daily.   polyethylene glycol packet Commonly known as:  MIRALAX / GLYCOLAX Take 17 g by mouth at bedtime. Pt also uses once daily as needed for constipation.   PRESERVISION/LUTEIN Caps Take 1 capsule by mouth 2 (two) times daily.   saccharomyces boulardii 250 MG capsule Commonly known as:  FLORASTOR Take 1 capsule (250 mg total) by mouth 2 (two) times daily.   SUPER CRANBERRY/VITAMIN D3 4200-500 MG-UNIT Caps Generic drug:  Cranberry-Cholecalciferol Take 1 capsule by mouth daily.   traMADol 50 MG tablet Commonly known as:  ULTRAM Take 50 mg by mouth 4 (four) times daily -  with meals and at bedtime.   trimethoprim 100 MG tablet Commonly known as:  TRIMPEX Take 100 mg by mouth at bedtime.   URIBEL 118 MG Caps Take 1 capsule by mouth 3 (three) times daily as needed.   zolpidem 5 MG tablet Commonly known as:  AMBIEN Take 5 mg by mouth at bedtime as needed for sleep.       Review of Systems  Constitutional: Positive for fatigue. Negative for activity change, appetite change, chills, diaphoresis and  fever.  HENT: Positive for hearing loss. Negative for congestion, rhinorrhea, sinus pain, sinus pressure, sneezing and sore throat.   Eyes: Negative.   Respiratory: Positive for cough and shortness of breath. Negative for chest tightness and wheezing.   Cardiovascular: Positive for leg swelling. Negative for chest pain and palpitations.  Gastrointestinal: Negative for abdominal distention, abdominal pain, constipation, diarrhea, nausea and vomiting.  Genitourinary: Negative for frequency, hematuria and urgency.        In and out cath twice daily  Musculoskeletal: Positive for gait problem.  Skin: Negative for color change, pallor and rash.  Neurological: Negative for dizziness, seizures, syncope, light-headedness and headaches.       Generalized weakness   Hematological: Does not bruise/bleed easily.  Psychiatric/Behavioral: Negative for agitation, confusion, hallucinations and sleep disturbance. The patient is not nervous/anxious.     Immunization History  Administered Date(s) Administered  . Influenza Whole 10/12/2011, 10/12/2012  . Influenza-Unspecified 10/25/2013, 10/10/2014, 12/12/2015  . PPD Test 07/07/2010, 03/24/2016  . Pneumococcal Conjugate-13 01/12/2003  . Zoster 01/12/2007   Pertinent  Health Maintenance Due  Topic Date Due  . PNA vac Low Risk Adult (2 of 2 - PPSV23) 01/12/2004  . INFLUENZA VACCINE  08/11/2016  . MAMMOGRAM  08/11/2016  . DEXA SCAN  Completed   Fall Risk  11/11/2015 10/16/2015 07/03/2015 01/28/2015 10/22/2014  Falls in the past year? Yes No No No No  Number falls in past yr: 1 - - - -  Injury with Fall? Yes - - - -  Risk Factor Category  High Fall Risk - - - -  Risk for fall due to : History of fall(s);Impaired balance/gait;Impaired mobility - - - -  Follow up Falls evaluation completed - - - -    Vitals:   07/27/16 1400  BP: 122/66  Pulse: 82  Resp: 20  Temp: (!) 97.4 F (36.3 C)  SpO2: (!) 89%  Weight: 127 lb 3.2 oz (57.7 kg)  Height: 5\' 1"   (1.549 m)   Body mass index is 24.03 kg/m. Physical Exam  Constitutional: She is oriented to person, place, and time. She appears well-developed and well-nourished. No distress.  HENT:  Head: Normocephalic.  Mouth/Throat: Oropharynx is clear and moist. No oropharyngeal exudate.  Bilateral Hearing aids in place  Eyes: Pupils are equal, round, and reactive to light. Conjunctivae and EOM are normal. Right eye exhibits no discharge. Left eye exhibits no discharge. No scleral icterus.  Neck: Normal range of motion. No JVD present. No thyromegaly present.  Cardiovascular: Normal rate, regular rhythm, normal heart sounds and intact distal pulses.  Exam reveals no gallop and no friction rub.   No murmur heard. Pulmonary/Chest: Effort normal and breath sounds normal. No respiratory distress. She has no wheezes. She has no rales.  Abdominal: Soft. Bowel sounds are normal. She exhibits no distension. There is no tenderness. There is no rebound and no guarding.   Suprapubic tenderness to palpation   Musculoskeletal: She exhibits no edema or tenderness.  Unsteady gait. Bilateral lower extremities 1-2+ edema.   Lymphadenopathy:    She has no cervical adenopathy.  Neurological: She is oriented to person, place, and time.  Skin: Skin is warm and dry. No rash noted. No erythema. There is pallor.  Psychiatric: She has a normal mood and affect.    Labs reviewed:  Recent Labs  01/19/16 0800  02/18/16 1204  05/20/16 1658 05/24/16 1504 06/15/16 06/21/16 1413 07/19/16 1432  NA 134*  < > 129*  < > 134* 137 129* 134* 132*  K 4.5  < > 4.2  < > 4.1 4.1 4.7 4.4 4.2  CL 98  --  98*  --  100*  --   --   --   --   CO2 28  < > 23  < > 28 27  --  27 28  GLUCOSE 107*  < > 108*  < > 111* 101  --  117 128  BUN 15  < > 13  < > 15 15.3 13 11.3 14.1  CREATININE 0.81  < >  0.71  < > 0.81 1.0 0.8 0.9 1.0  CALCIUM 9.5  < > 8.9  < > 7.6* 8.5  --  8.8 8.8  < > = values in this interval not displayed.  Recent Labs   05/24/16 1504 06/15/16 06/21/16 1413 07/19/16 1432  AST 36* 24 47* 65*  ALT 22 13 26  36  ALKPHOS 154* 137* 180* 257*  BILITOT 0.36  --  0.33 0.41  PROT 6.1*  --  5.9* 6.0*  ALBUMIN 3.2*  --  2.9* 3.0*    Recent Labs  05/24/16 1504 06/15/16 06/21/16 1413 07/19/16 1432  WBC 7.2 7.8 7.1 6.7  NEUTROABS 3.3  --  3.3 3.0  HGB 11.5* 11.0* 11.8 11.2*  HCT 36.0 34* 36.3 35.6  MCV 92.5  --  88.9 89.4  PLT 235 404* 245 214   Lab Results  Component Value Date   TSH 3.215 08/25/2015   Lab Results  Component Value Date   HGBA1C 5.9 01/23/2015    Significant Diagnostic Results in last 30 days:  No results found.  Assessment/Plan 1. Fatigue Afebrile. Hx of breast cancer with mets.Will obtain stat CBC/diff, CMP rule out infectious and metabolic etiology.   2. Hypoxia O2 sats 89 % on RA. Respiration unlabored. Oxygen 2 Liters via  applied oxygen saturations went up to 95 %.suspect due to metastatic breast cancer to the lungs.occasional cough and shortness of breath reported.Bilateral diminished lung bases noted. Wean off oxygen as tolerated to keep oxygen saturations above 90%. Monitor vital signs every shift x 5 days then resume previous order.   3. Dark yellow-colored urine Afebrile.remains high risk for frequent UTI's due to In and Out self Cath.Obtain urine specimen for U/A and C/S rule out UTI.   4. Normocytic Anemia   Hgb 10.9 previous 11.2, HCT 33.0 ( 07/27/2016). Guaiac stool x 3 rule out GI bleed.   Family/ staff Communication: Reviewed plan of care with patient and facility Nurse supervisor  Labs/tests ordered: None   Sandrea Hughs, NP

## 2016-07-28 ENCOUNTER — Non-Acute Institutional Stay: Payer: Medicare Other | Admitting: Family

## 2016-07-28 DIAGNOSIS — J189 Pneumonia, unspecified organism: Secondary | ICD-10-CM

## 2016-07-28 DIAGNOSIS — R748 Abnormal levels of other serum enzymes: Secondary | ICD-10-CM

## 2016-07-28 DIAGNOSIS — J90 Pleural effusion, not elsewhere classified: Secondary | ICD-10-CM

## 2016-07-28 DIAGNOSIS — E440 Moderate protein-calorie malnutrition: Secondary | ICD-10-CM

## 2016-07-28 DIAGNOSIS — E8809 Other disorders of plasma-protein metabolism, not elsewhere classified: Secondary | ICD-10-CM | POA: Diagnosis not present

## 2016-07-28 DIAGNOSIS — I5022 Chronic systolic (congestive) heart failure: Secondary | ICD-10-CM | POA: Diagnosis not present

## 2016-07-28 MED ORDER — SACCHAROMYCES BOULARDII 250 MG PO CAPS: 250.0000 mg | ORAL_CAPSULE | Freq: Two times a day (BID) | ORAL | 0 refills | Status: AC

## 2016-07-28 MED ORDER — FUROSEMIDE 40 MG PO TABS: 40.0000 mg | ORAL_TABLET | Freq: Every day | ORAL | Status: DC

## 2016-07-28 MED ORDER — DOXYCYCLINE HYCLATE 100 MG PO TABS: 100.0000 mg | ORAL_TABLET | Freq: Two times a day (BID) | ORAL | 0 refills | Status: AC

## 2016-07-28 NOTE — Progress Notes (Signed)
Location:  [facility] Room Number: 8 Place of Service:  ALF [phone]) Provider: Roena Sassaman FNP-C  Blanchie Serve, MD  Patient Care Team: Blanchie Serve, MD as PCP - General (Internal Medicine) Melina Modena, [facility], Man X, NP as Nurse Practitioner (Nurse Practitioner) Shon Hough, MD as Consulting Physician (Ophthalmology) Molli Posey, MD as Consulting Physician (Obstetrics and Gynecology) Gerarda Fraction, MD as Consulting Physician (Ophthalmology) Lorelle Gibbs, MD as Consulting Physician (Radiology) Melrose Nakayama, MD as Consulting Physician (Orthopedic Surgery) Gerarda Fraction, MD as Referring Physician (Ophthalmology) Ardis Hughs, MD as Attending Physician (Urology) Magrinat, Virgie Dad, MD as Consulting Physician (Oncology)  Extended Emergency Contact Information Primary Emergency Contact: Black,Emily Address: [address]          [place], [place] [address] [name] of [place] Phone: [phone] Mobile Phone: [phone] Relation: Daughter Secondary Emergency Contact: Marcine Matar [place] of [place] Mobile Phone: [phone] Relation: Daughter  Code Status: DNR Goals of care: Advanced Directive information Advanced Directives 06/18/2016  Does Patient Have a Medical Advance Directive? Yes  Type of Advance Directive Out of facility DNR (pink MOST or yellow form);Living will;Healthcare Power of Attorney  Does patient want to make changes to medical advance directive? -  Copy of [place] in Chart? Yes  Would patient like information on creating a medical advance directive? -  Pre-existing out of facility DNR order (yellow form or pink MOST form) Yellow form placed in chart (order not valid for inpatient use)     Chief Complaint  Patient presents with  . Acute Visit    abnormal lab results and Chest X-ray    HPI:  Pt is a 83 y.o. female seen [facility] at [facility] for an acute  visit for evaluation of abnormal lab results and chest X-ray. She is seen in her room today. She states her oxygen saturation has improved with uses of continuous oxygen via nasal cannula. Her lab results showed Na 132, TP 5.3, Alb 3.1, ca 8.1,AST 68,ALT 34, ALK phos 235 ( 07/27/2016). Chest X-ray showed Cardiomegaly suggestive of congestive heart Failure. Patchy interstitial changes both mid-lower lungs consistent with both pneumonia and interstitial Pulmonary edema. Right persistent pleural effusion partially obscuring right hemidiaphragm ( 07/27/2016).    Past Medical History:  Diagnosis Date  . Abnormal PET scan of mediastinum 11/11/2015  . Abnormalities of the hair 02/29/2012  . Arthritis   . BPV (benign positional vertigo)   . Cancer ([place])   . Cancer antigen 125 (CA 125) elevation 11/27/2013  . Candidiasis of skin and nails 03/19/2011  . 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 Chorioretinal scar, macular 01/23/2015  . Closed fracture of base of neck of femur ([place]) 03/18/2011  . Compression fracture of thoracic vertebra ([facility]) 11/24/2012  . Contact dermatitis and other eczema, due to unspecified cause 05/07/2011  . Depression 07/23/2014  . Diverticulosis of colon (without mention of hemorrhage) 03/19/2011  . Edema 05/07/2011  . HOH (hard of hearing)   . Hypertension 02/24/2016  . Hypopotassemia 03/26/2011  . Hyposmolality and/or hyponatremia 03/19/2011  . Hypotension, unspecified 03/18/2011  . IBS (irritable bowel syndrome) 11/27/2013  . Idiopathic scoliosis 01/27/2016  . Impaired fasting glucose 03/19/2011  . Insomnia, unspecified 03/19/2011  . Kidney infection   . Lumbago 03/19/2011  . Macular degeneration   . Malignant neoplasm of breast (female), unspecified site 10/28/2002  . Mass of right lung 10/23/2015  . Metastasis to adrenal gland ([place]) 11/21/2015   Breast cancer  .  Metastatic breast cancer ([place]) 10/27/2004  . Nonexudative age-related macular degeneration 12/10/2010  .  Osteoporosis 08/06/2014  . Osteoporosis, unspecified 03/19/2011  . Other malaise and fatigue 03/19/2011  . Pain in joint, pelvic region and thigh 03/19/2011  . Primary osteoarthritis of right hip 05/28/2014   TOTAL HIP ARTHROPLASTY ANTERIOR APPROACH HARDWARE REMOVAL on 05/28/2014   . Purpura senilis ([place]) 07/31/2013  . Radiculopathy, cervical 03/23/2016   Right C6  . Rib fractures   . Right bundle branch block 08/30/2011  . Shortness of breath   . Spasm of muscle 03/19/2011  . Spinal stenosis, lumbar region, with neurogenic claudication 11/02/2011  . TMJ click 05/13/8880  . Trigger finger (acquired) 11/02/2011  . Unspecified constipation 03/19/2011  . Urinary frequency 04/24/2013  . UTI (urinary tract infection) 04/16/2013   04/16/13 pseudomonas aeruginosa: tx with Cipro 06/15/14 P. Aeruginosa Fortaz 541m IM q12h x 7 days.     . Vaginitis and vulvovaginitis 02/29/2012  . Vertigo 08/26/2015   Past Surgical History:  Procedure Laterality Date  . BREAST SURGERY Bilateral 2005-10/27/2004   lumpectomy- Streck,MD  . CATARACT EXTRACTION  2010   bialteral  . EYE SURGERY     cataract  . FRACTURE SURGERY Left 09/1980   ankle  . HARDWARE REMOVAL Right 05/28/2014   Procedure: HARDWARE REMOVAL;  Surgeon: PMelrose Nakayama MD;  Location: [facility]  Service: Orthopedics;  Laterality: Right;  . HIP PINNING,CANNULATED  03/08/2011   Procedure: CANNULATED HIP PINNING;  Surgeon: JJohnn Hai MD;  Location: [facility];  Service: Orthopedics;  Laterality: Right;  . ORIF HIP FRACTURE Right 03/08/2011   Bean,MD  . SKIN CANCER EXCISION Left 10/12/2012   lower leg Dr. [place] . SKIN LESION EXCISION Right 02/04/2011   Abdomen lesion spongiotic dermatitis-Taffeen, MD  . SQUAMOUS CELL CARCINOMA EXCISION Right 02/04/2011   forearm-[place] MD  . TONSILLECTOMY    . TOTAL HIP ARTHROPLASTY Right 05/28/2014   Procedure: TOTAL HIP ARTHROPLASTY ANTERIOR APPROACH;  Surgeon: PMelrose Nakayama MD;  [facility]  Service:  Orthopedics;  Laterality: Right;    Allergies  Allergen Reactions  . Macrodantin [Nitrofurantoin] Other (See Comments)    Reaction:  Unknown     Allergies as of 07/28/2016      Reactions   Macrodantin [nitrofurantoin] Other (See Comments)   Reaction:  Unknown       Medication List       Accurate as of 07/28/16 12:04 PM. Always use your most recent med list.          acetaminophen 500 MG tablet Commonly known as:  TYLENOL Take 500 mg by mouth every 6 (six) hours as needed for moderate pain.   ALPRAZolam 0.5 MG tablet Commonly known as:  XANAX Take 0.5 mg by mouth daily. Pt is also able to use twice daily as needed for anxiety.   anastrozole 1 MG tablet Commonly known as:  ARIMIDEX Take 1 tablet (1 mg total) by mouth daily.   antiseptic oral rinse Liqd 15 mLs by Mouth Rinse route 2 (two) times daily as needed for dry mouth.   aspirin 81 MG chewable tablet Chew 81 mg by mouth daily.   Bilberry 150 MG Caps Take 150 mg by mouth daily.   CALAGEL MAXIMUM STRENGTH EX Apply 1 application topically as directed.   CALCIUM 600+D 600-400 MG-UNIT tablet Generic drug:  Calcium Carbonate-Vitamin D Take 1 tablet by mouth 2 (two) times daily.   Cholecalciferol 1000 units tablet Take 1,000 Units by mouth daily.  diphenhydrAMINE-zinc acetate cream Commonly known as:  BENADRYL Apply 1 application topically 2 (two) times daily.   doxycycline 100 MG tablet Commonly known as:  VIBRA-TABS Take 1 tablet (100 mg total) by mouth 2 (two) times daily.   Fish Oil 1000 MG Caps Take 1 capsule by mouth daily.   furosemide 40 MG tablet Commonly known as:  LASIX Take 1 tablet (40 mg total) by mouth daily. Hold if SBP < 110   GLUCOSAMINE CHONDR COMPLEX PO Take 2 tablets by mouth daily. for joints   guaiFENesin 600 MG 12 hr tablet Commonly known as:  MUCINEX Take 600 mg by mouth 2 (two) times daily as needed for cough.   ibuprofen 400 MG tablet Commonly known as:   ADVIL,MOTRIN Take 400 mg by mouth as needed.   loperamide 2 MG tablet Commonly known as:  IMODIUM A-D Take 4 mg by mouth as needed for diarrhea or loose stools. Give 4 mg by mouth as the initial dose, then 2 mg by mouth after each loose stool.   meclizine 25 MG tablet Commonly known as:  ANTIVERT Take 1 tablet (25 mg total) by mouth 3 (three) times daily as needed for dizziness.   mirtazapine 7.5 MG tablet Commonly known as:  REMERON Take 7.5 mg by mouth at bedtime.   NON FORMULARY 1 bottle of gatorade as needed at residents request   nystatin powder Commonly known as:  MYCOSTATIN/NYSTOP Apply topically daily. 1 application to bilateral breast (under)   omeprazole 20 MG capsule Commonly known as:  PRILOSEC Take 20 mg by mouth 2 (two) times daily before a meal.   ondansetron 4 MG disintegrating tablet Commonly known as:  ZOFRAN-ODT Take 4 mg by mouth every 8 (eight) hours as needed for nausea or vomiting.   polycarbophil 625 MG tablet Commonly known as:  FIBERCON Take 625 mg by mouth 2 (two) times daily.   polyethylene glycol packet Commonly known as:  MIRALAX / GLYCOLAX Take 17 g by mouth at bedtime. Pt also uses once daily as needed for constipation.   PRESERVISION/LUTEIN Caps Take 1 capsule by mouth 2 (two) times daily.   saccharomyces boulardii 250 MG capsule Commonly known as:  FLORASTOR Take 1 capsule (250 mg total) by mouth 2 (two) times daily.   SUPER CRANBERRY/VITAMIN D3 4200-500 MG-UNIT Caps Generic drug:  Cranberry-Cholecalciferol Take 1 capsule by mouth daily.   traMADol 50 MG tablet Commonly known as:  ULTRAM Take 50 mg by mouth 4 (four) times daily -  with meals and at bedtime.   trimethoprim 100 MG tablet Commonly known as:  TRIMPEX Take 100 mg by mouth at bedtime.   URIBEL 118 MG Caps Take 1 capsule by mouth 3 (three) times daily as needed.       Review of Systems  Constitutional: Positive for fatigue. Negative for activity change,  appetite change, chills, diaphoresis and fever.  HENT: Positive for hearing loss.   Respiratory: Positive for cough and shortness of breath. Negative for chest tightness and wheezing.   Cardiovascular: Positive for leg swelling. Negative for chest pain and palpitations.  Gastrointestinal: Negative for abdominal distention, abdominal pain, constipation, diarrhea, nausea and vomiting.  Genitourinary: Negative for frequency, hematuria and urgency.        In and out cath twice daily  Musculoskeletal: Positive for gait problem.  Skin: Negative for color change, pallor and rash.  Neurological: Negative for dizziness, seizures, syncope, light-headedness and headaches.       Generalized weakness   Psychiatric/Behavioral: Negative  for agitation, confusion, hallucinations and sleep disturbance. The patient is not nervous/anxious.     Immunization History  Administered Date(s) Administered  . Influenza Whole 10/12/2011, 10/12/2012  . Influenza-Unspecified 10/25/2013, 10/10/2014, 12/12/2015  . PPD Test 07/07/2010, 03/24/2016  . Pneumococcal Conjugate-13 01/12/2003  . Zoster 01/12/2007   Pertinent  Health Maintenance Due  Topic Date Due  . PNA vac Low Risk Adult (2 of 2 - PPSV23) 01/12/2004  . INFLUENZA VACCINE  08/11/2016  . MAMMOGRAM  08/11/2016  . DEXA SCAN  Completed   Fall Risk  11/11/2015 10/16/2015 07/03/2015 01/28/2015 10/22/2014  Falls in the past year? Yes No No No No  Number falls in past yr: 1 - - - -  Injury with Fall? Yes - - - -  Risk Factor Category  High Fall Risk - - - -  Risk for fall due to : History of fall(s);Impaired balance/gait;Impaired mobility - - - -  Follow up Falls evaluation completed - - - -    Vitals:   07/28/16 1000  BP: (!) 155/77  Pulse: 87  Resp: 20  Temp: 99.3 F (37.4 C)  SpO2: 90%  Weight: 127 lb 3.2 oz (57.7 kg)  Height: _0  (1.549 m)   Body mass index is 24.03 kg/m. Physical Exam  Constitutional: She is oriented to person, place, and  time. She appears well-developed and well-nourished. No distress.  Eyes: Pupils are equal, round, and reactive to light. Conjunctivae and EOM are normal. Right eye exhibits no discharge. Left eye exhibits no discharge. No scleral icterus.  Neck: No JVD present. No thyromegaly present.  Cardiovascular: Normal rate, regular rhythm, normal heart sounds and intact distal pulses.  Exam reveals no gallop and no friction rub.   No murmur heard. Pulmonary/Chest: Effort normal. No respiratory distress. She has no wheezes. She has no rales.  Diminished bases   Abdominal: Soft. Bowel sounds are normal. She exhibits no distension. There is no tenderness. There is no rebound and no guarding.   Suprapubic tenderness to palpation   Musculoskeletal: She exhibits no edema or tenderness.  Moves x 4 extremities uses FWW to ambulate. Bilateral lower extremities 1-2+ edema.   Lymphadenopathy:    She has no cervical adenopathy.  Neurological: She is oriented to person, place, and time. Coordination normal.  Skin: Skin is warm and dry. No rash noted. No erythema. There is pallor.  Psychiatric: She has a normal mood and affect.    Labs reviewed:  Recent Labs  01/19/16 0800  02/18/16 1204  05/20/16 1658 05/24/16 1504 06/15/16 06/21/16 1413 07/19/16 1432  NA 134*  < > 129*  < > 134* 137 129* 134* 132*  K 4.5  < > 4.2  < > 4.1 4.1 4.7 4.4 4.2  CL 98  --  98*  --  100*  --   --   --   --   CO2 28  < > 23  < > 28 27  --  27 28  GLUCOSE 107*  < > 108*  < > 111* 101  --  117 128  BUN 15  < > 13  < > 15 15.3 13 11.3 14.1  CREATININE 0.81  < > 0.71  < > 0.81 1.0 0.8 0.9 1.0  CALCIUM 9.5  < > 8.9  < > 7.6* 8.5  --  8.8 8.8  < > = values in this interval not displayed.  Recent Labs  05/24/16 1504 06/15/16 06/21/16 1413 07/19/16 1432  AST 36* 24  47* 65*  ALT _0 36  ALKPHOS 154* 137* 180* 257*  BILITOT 0.36  --  0.33 0.41  PROT 6.1*  --  5.9* 6.0*  ALBUMIN 3.2*  --  2.9* 3.0*    Recent Labs   05/24/16 1504 06/15/16 06/21/16 1413 07/19/16 1432  WBC 7.2 7.8 7.1 6.7  NEUTROABS 3.3  --  3.3 3.0  HGB 11.5* 11.0* 11.8 11.2*  HCT 36.0 34* 36.3 35.6  MCV 92.5  --  88.9 89.4  PLT 235 404* 245 214   Lab Results  Component Value Date   TSH 3.215 08/25/2015   Lab Results  Component Value Date   HGBA1C 5.9 01/23/2015    Significant Diagnostic Results in last 30 days:  Chest X-ray 07/27/2016:  Portable Chest X-ray Impression:  Cardiomegaly suggestive of congestive heart Failure. Patchy interstitial changes both mid-lower lungs consistent with both pneumonia and interstitial Pulmonary edema. Right persistent pleural effusion partially obscuring right hemidiaphragm   Assessment/Plan 1. Community acquired pneumonia, unspecified laterality Temp 99.3 chest X-ray showed Patchy interstitial changes both mid-lower lungs consistent with both pneumonia and interstitial Pulmonary edema.Right persistent pleural effusion partially obscuring right hemidiaphragm 07/27/2016. Start Doxycycline 100 mg Tablet one by mouth twice daily X 10 days. Florastor 250 mg Capsule one by mouth twice daily X 10 days.     2. Recurrent right pleural effusion Has improved per chest X-ray. Continue to monitor.   3. Chronic systolic congestive heart failure  Chest X-ray showed Cardiomegaly suggestive of congestive heart Failure ( 07/27/2016). Bilateral lower extremities 1-2+ edema. Elevates legs on recliner. Has had cough and occasional shortness of breath. Will increase her Furosemide to 40 mg Tablet one by mouth once daily. Hold if SBP < 110 Recheck BMP 08/05/2016.    4. Moderate protein-calorie malnutrition   TP 5.3 ( 07/28/2016). RD to evaluate for protein supplement.monitor CMP.    5. Hypoalbuminemia  Alb 3.1( 07/28/2016).RD to evaluate for protein supplement. Monitor CMP.   6. Elevated liver enzymes AST 68,ALT 34, ALK phos 235. Medications reviewed suspect possible side effects from Anastrozole. Will Repeat CMP  08/05/2016 if persistent will let Oncology evaluate med.   Family/ staff Communication: Reviewed plan of care with patient and facility Nurse.   Labs/tests ordered: CMP 08/05/2016   Nelda Bucks Natesha Hassey, NP

## 2016-07-29 DIAGNOSIS — E8809 Other disorders of plasma-protein metabolism, not elsewhere classified: Secondary | ICD-10-CM | POA: Diagnosis not present

## 2016-07-29 DIAGNOSIS — R770 Abnormality of albumin: Secondary | ICD-10-CM | POA: Diagnosis not present

## 2016-08-03 ENCOUNTER — Telehealth: Payer: Self-pay | Admitting: *Deleted

## 2016-08-05 ENCOUNTER — Telehealth: Payer: Self-pay | Admitting: *Deleted

## 2016-08-05 DIAGNOSIS — D649 Anemia, unspecified: Secondary | ICD-10-CM | POA: Diagnosis not present

## 2016-08-05 NOTE — Telephone Encounter (Signed)
This RN returned call to Cassandra Glenn to discuss her questions-as well as goal of care.  Per call - advice given to proceed with PET scan and visit with MD- which depending on results of the PET would help [place] plan further care and medical testing.  Presently primary concern with pt per Cassandra Glenn is change in pt's appetite due to " having a dry mouth ".  Pt's protein, albumin and iron levels are low which Cassandra Glenn has discussed with the nutritionist at the pt's SNF.  Per above- plan is for pt to proceed with PET and MD appointment. Cassandra Glenn will have concerns and questions written down so plan can be discussed at that time.  No further questions at this time.

## 2016-08-05 NOTE — Telephone Encounter (Signed)
"  Wilford Grist calling for Dr. Virgie Dad view on my mom having mammogram and bone density.  The PET he ordered is scheduled for July 30th.  Received call she's due for mammogram and scheduled on August 1.  They were trying to schedule a bone scan.  She is 83 yrs old.  What diagnostic test does he think are needed?  Is there an overlap?   Due to see Dr. Jana Hakim and Delton See on August 6 th.  Return number [phone]."  [facility] ordered mammogram and DEXA.

## 2016-08-09 ENCOUNTER — Ambulatory Visit (HOSPITAL_COMMUNITY)
Admission: RE | Admit: 2016-08-09 | Discharge: 2016-08-09 | Disposition: A | Discharge status: Home / Self Care | Payer: Medicare Other | Source: Ambulatory Visit | Attending: Oncology | Admitting: Oncology

## 2016-08-09 DIAGNOSIS — R590 Localized enlarged lymph nodes: Secondary | ICD-10-CM | POA: Insufficient documentation

## 2016-08-09 DIAGNOSIS — Z7982 Long term (current) use of aspirin: Secondary | ICD-10-CM | POA: Diagnosis not present

## 2016-08-09 DIAGNOSIS — C7970 Secondary malignant neoplasm of unspecified adrenal gland: Secondary | ICD-10-CM | POA: Diagnosis not present

## 2016-08-09 DIAGNOSIS — R188 Other ascites: Secondary | ICD-10-CM | POA: Diagnosis not present

## 2016-08-09 DIAGNOSIS — Z79899 Other long term (current) drug therapy: Secondary | ICD-10-CM | POA: Insufficient documentation

## 2016-08-09 DIAGNOSIS — C50919 Malignant neoplasm of unspecified site of unspecified female breast: Secondary | ICD-10-CM | POA: Diagnosis not present

## 2016-08-09 LAB — GLUCOSE, CAPILLARY: GLUCOSE-CAPILLARY: 85 mg/dL (ref 65–99)

## 2016-08-09 MED ORDER — FLUDEOXYGLUCOSE F - 18 (FDG) INJECTION
8.2000 | Freq: Once | INTRAVENOUS | Status: AC | PRN
  Administered 2016-08-09: 8.2 via INTRAVENOUS

## 2016-08-11 ENCOUNTER — Non-Acute Institutional Stay: Payer: Medicare Other | Admitting: Family

## 2016-08-11 ENCOUNTER — Encounter: Payer: Self-pay | Admitting: Family

## 2016-08-11 DIAGNOSIS — K219 Gastro-esophageal reflux disease without esophagitis: Secondary | ICD-10-CM | POA: Diagnosis not present

## 2016-08-11 DIAGNOSIS — R339 Retention of urine, unspecified: Secondary | ICD-10-CM | POA: Diagnosis not present

## 2016-08-11 DIAGNOSIS — C50919 Malignant neoplasm of unspecified site of unspecified female breast: Secondary | ICD-10-CM

## 2016-08-11 DIAGNOSIS — I10 Essential (primary) hypertension: Secondary | ICD-10-CM | POA: Diagnosis not present

## 2016-08-11 DIAGNOSIS — M1611 Unilateral primary osteoarthritis, right hip: Secondary | ICD-10-CM | POA: Diagnosis not present

## 2016-08-11 DIAGNOSIS — K5901 Slow transit constipation: Secondary | ICD-10-CM

## 2016-08-12 DIAGNOSIS — E871 Hypo-osmolality and hyponatremia: Secondary | ICD-10-CM | POA: Diagnosis not present

## 2016-08-12 DIAGNOSIS — R6889 Other general symptoms and signs: Secondary | ICD-10-CM | POA: Diagnosis not present

## 2016-08-12 LAB — CBC AND DIFFERENTIAL
HEMATOCRIT: 32 — AB (ref 36–46)
HEMOGLOBIN: 10.5 — AB (ref 12.0–16.0)
PLATELETS: 191 (ref 150–399)
WBC: 8.1

## 2016-08-13 ENCOUNTER — Non-Acute Institutional Stay: Payer: Medicare Other | Admitting: Internal Medicine

## 2016-08-13 ENCOUNTER — Encounter: Payer: Self-pay | Admitting: Internal Medicine

## 2016-08-13 DIAGNOSIS — K219 Gastro-esophageal reflux disease without esophagitis: Secondary | ICD-10-CM | POA: Diagnosis not present

## 2016-08-13 DIAGNOSIS — D649 Anemia, unspecified: Secondary | ICD-10-CM

## 2016-08-13 NOTE — Progress Notes (Signed)
Location:  [facility] Room Number: 8 Place of Service:  ALF [phone]) Provider:    Blanchie Serve, MD  Patient Care Team: Blanchie Serve, MD as PCP - General (Internal Medicine) Melina Modena, [facility], Man X, NP as Nurse Practitioner (Nurse Practitioner) Shon Hough, MD as Consulting Physician (Ophthalmology) Molli Posey, MD as Consulting Physician (Obstetrics and Gynecology) Gerarda Fraction, MD as Consulting Physician (Ophthalmology) Lorelle Gibbs, MD as Consulting Physician (Radiology) Melrose Nakayama, MD as Consulting Physician (Orthopedic Surgery) Gerarda Fraction, MD as Referring Physician (Ophthalmology) Ardis Hughs, MD as Attending Physician (Urology) Magrinat, Virgie Dad, MD as Consulting Physician (Oncology)  Extended Emergency Contact Information Primary Emergency Contact: Black,Emily Address: [address]          [place], [place] [address] [name] of [place] Phone: [phone] Mobile Phone: [phone] Relation: Daughter Secondary Emergency Contact: Marcine Matar [place] of [place] Mobile Phone: [phone] Relation: Daughter  Code Status:  DNR  Goals of care: Advanced Directive information Advanced Directives 08/11/2016  Does Patient Have a Medical Advance Directive? Yes  Type of Advance Directive Out of facility DNR (pink MOST or yellow form);Living will;Healthcare Power of Attorney  Does patient want to make changes to medical advance directive? -  Copy of [place] in Chart? Yes  Would patient like information on creating a medical advance directive? -  Pre-existing out of facility DNR order (yellow form or pink MOST form) Yellow form placed in chart (order not valid for inpatient use)     Chief Complaint  Patient presents with  . Acute Visit    Anemia    HPI:  Patient is a 83 y.o. female seen today for an acute visit for anemia. She has a drop in her hemoglobin noted on  lab. She denies any bleeding this visit. She had guaiac stool that was negative for bleed. She has been at her baseline per nursing. She complaints of being tired. She denies any nausea or vomiting.    Past Medical History:  Diagnosis Date  . Abnormal PET scan of mediastinum 11/11/2015  . Abnormalities of the hair 02/29/2012  . Arthritis   . BPV (benign positional vertigo)   . Cancer ([facility])   . Cancer antigen 125 (CA 125) elevation 11/27/2013  . Candidiasis of skin and nails 03/19/2011  . 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 Chorioretinal scar, macular 01/23/2015  . Closed fracture of base of neck of femur ([facility]) 03/18/2011  . Compression fracture of thoracic vertebra ([facility]) 11/24/2012  . Contact dermatitis and other eczema, due to unspecified cause 05/07/2011  . Depression 07/23/2014  . Diverticulosis of colon (without mention of hemorrhage) 03/19/2011  . Edema 05/07/2011  . HOH (hard of hearing)   . Hypertension 02/24/2016  . Hypopotassemia 03/26/2011  . Hyposmolality and/or hyponatremia 03/19/2011  . Hypotension, unspecified 03/18/2011  . IBS (irritable bowel syndrome) 11/27/2013  . Idiopathic scoliosis 01/27/2016  . Impaired fasting glucose 03/19/2011  . Insomnia, unspecified 03/19/2011  . Kidney infection   . Lumbago 03/19/2011  . Macular degeneration   . Malignant neoplasm of breast (female), unspecified site 10/28/2002  . Mass of right lung 10/23/2015  . Metastasis to adrenal gland ([facility]) 11/21/2015   Breast cancer  . Metastatic breast cancer ([facility]) 10/27/2004  . Nonexudative age-related macular degeneration 12/10/2010  . Osteoporosis 08/06/2014  . Osteoporosis, unspecified 03/19/2011  . Other malaise and fatigue 03/19/2011  . Pain in joint, pelvic region and thigh 03/19/2011  . Primary osteoarthritis of  right hip 05/28/2014   TOTAL HIP ARTHROPLASTY ANTERIOR APPROACH HARDWARE REMOVAL on 05/28/2014   . Purpura senilis ([facility]) 07/31/2013  . Radiculopathy, cervical 03/23/2016    Right C6  . Rib fractures   . Right bundle branch block 08/30/2011  . Shortness of breath   . Spasm of muscle 03/19/2011  . Spinal stenosis, lumbar region, with neurogenic claudication 11/02/2011  . TMJ click 1/60/7371  . Trigger finger (acquired) 11/02/2011  . Unspecified constipation 03/19/2011  . Urinary frequency 04/24/2013  . UTI (urinary tract infection) 04/16/2013   04/16/13 pseudomonas aeruginosa: tx with Cipro 06/15/14 P. Aeruginosa Fortaz 500mg  IM q12h x 7 days.     . Vaginitis and vulvovaginitis 02/29/2012  . Vertigo 08/26/2015   Past Surgical History:  Procedure Laterality Date  . BREAST SURGERY Bilateral 2005-10/27/2004   lumpectomy- Streck,MD  . CATARACT EXTRACTION  2010   bialteral  . EYE SURGERY     cataract  . FRACTURE SURGERY Left 09/1980   ankle  . HARDWARE REMOVAL Right 05/28/2014   Procedure: HARDWARE REMOVAL;  Surgeon: Melrose Nakayama, MD;  Location: [facility];  Service: Orthopedics;  Laterality: Right;  . HIP PINNING,CANNULATED  03/08/2011   Procedure: CANNULATED HIP PINNING;  Surgeon: Johnn Hai, MD;  Location: [facility];  Service: Orthopedics;  Laterality: Right;  . ORIF HIP FRACTURE Right 03/08/2011   Bean,MD  . SKIN CANCER EXCISION Left 10/12/2012   lower leg Dr. [place]  . SKIN LESION EXCISION Right 02/04/2011   Abdomen lesion spongiotic dermatitis-Taffeen, MD  . SQUAMOUS CELL CARCINOMA EXCISION Right 02/04/2011   forearmSyble Creek, MD  . TONSILLECTOMY    . TOTAL HIP ARTHROPLASTY Right 05/28/2014   Procedure: TOTAL HIP ARTHROPLASTY ANTERIOR APPROACH;  Surgeon: Melrose Nakayama, MD;  Location: [facility];  Service: Orthopedics;  Laterality: Right;    Allergies  Allergen Reactions  . Macrodantin [Nitrofurantoin] Other (See Comments)    Reaction:  Unknown     Outpatient Encounter Prescriptions as of 08/13/2016  Medication Sig  . acetaminophen (TYLENOL) 500 MG tablet Take 500 mg by mouth every 6 (six) hours as needed for moderate pain.   Marland Kitchen ALPRAZolam (XANAX) 0.5 MG  tablet Take 0.5 mg by mouth daily. Pt is also able to use twice daily as needed for anxiety.  . Amino Acids-Protein Hydrolys (FEEDING SUPPLEMENT, PRO-STAT SUGAR FREE 64,) LIQD Take 30 mLs by mouth daily. Stop date 08/30/16  . anastrozole (ARIMIDEX) 1 MG tablet Take 1 tablet (1 mg total) by mouth daily.  Marland Kitchen antiseptic oral rinse (BIOTENE) LIQD 15 mLs by Mouth Rinse route 2 (two) times daily as needed for dry mouth.   Marland Kitchen aspirin 81 MG chewable tablet Chew 81 mg by mouth daily.   . Bilberry 150 MG CAPS Take 150 mg by mouth daily.  . Calcium Carbonate-Vitamin D (CALCIUM 600+D) 600-400 MG-UNIT tablet Take 1 tablet by mouth 2 (two) times daily.  . Cholecalciferol 1000 units tablet Take 1,000 Units by mouth daily.  . Cranberry-Cholecalciferol (SUPER CRANBERRY/VITAMIN D3) 4200-500 MG-UNIT CAPS Take 1 capsule by mouth daily.  . Diphenhyd-Benzeth-Menth-Zn Ace (CALAGEL MAXIMUM STRENGTH EX) Apply 1 application topically as directed.  . furosemide (LASIX) 40 MG tablet Take 1 tablet (40 mg total) by mouth daily. Hold if SBP < 110  . Glucosamine-Chondroitin (GLUCOSAMINE CHONDR COMPLEX PO) Take 2 tablets by mouth daily. for joints  . guaiFENesin (MUCINEX) 600 MG 12 hr tablet Take 600 mg by mouth 2 (two) times daily as needed for cough.  Marland Kitchen ibuprofen (ADVIL,MOTRIN) 400 MG  tablet Take 400 mg by mouth as needed.  . Lactobacillus Rhamnosus, GG, (CULTURELLE PO) Take 1 tablet by mouth daily.  Marland Kitchen loperamide (IMODIUM A-D) 2 MG tablet Take 4 mg by mouth as needed for diarrhea or loose stools. Give 4 mg by mouth as the initial dose, then 2 mg by mouth after each loose stool.  . magnesium hydroxide (MILK OF MAGNESIA) 400 MG/5ML suspension Take 30 mLs by mouth as needed for mild constipation.  . meclizine (ANTIVERT) 25 MG tablet Take 1 tablet (25 mg total) by mouth 3 (three) times daily as needed for dizziness.  . Meth-Hyo-M Bl-Na Phos-Ph Sal (URIBEL) 118 MG CAPS Take 1 capsule by mouth 3 (three) times daily as needed.  .  mirtazapine (REMERON) 7.5 MG tablet Take 7.5 mg by mouth at bedtime.  . Multiple Vitamins-Minerals (PRESERVISION/LUTEIN) CAPS Take 1 capsule by mouth 2 (two) times daily.  . NON FORMULARY 1 bottle of gatorade as needed at residents request  . nystatin (MYCOSTATIN/NYSTOP) powder Apply topically daily. 1 application to bilateral breast (under)  . Omega-3 Fatty Acids (FISH OIL) 1000 MG CAPS Take 1 capsule by mouth daily.  Marland Kitchen omeprazole (PRILOSEC) 40 MG capsule Take 40 mg by mouth daily.  . ondansetron (ZOFRAN-ODT) 4 MG disintegrating tablet Take 4 mg by mouth every 8 (eight) hours as needed for nausea or vomiting.  . polycarbophil (FIBERCON) 625 MG tablet Take 625 mg by mouth 2 (two) times daily.  . polyethylene glycol (MIRALAX / GLYCOLAX) packet Take 17 g by mouth at bedtime. Pt also uses once daily as needed for constipation.  . traMADol (ULTRAM) 50 MG tablet Take 50 mg by mouth 4 (four) times daily -  with meals and at bedtime.   Marland Kitchen trimethoprim (TRIMPEX) 100 MG tablet Take 100 mg by mouth at bedtime.  . [DISCONTINUED] omeprazole (PRILOSEC) 20 MG capsule Take 20 mg by mouth 2 (two) times daily before a meal.    No facility-administered encounter medications on file as of 08/13/2016.     Review of Systems  Constitutional: Negative for appetite change, chills, diaphoresis and fever.  HENT: Negative for congestion, mouth sores and rhinorrhea.   Respiratory: Negative for cough and shortness of breath.        Occasional shortness of breath  Cardiovascular: Negative for chest pain and palpitations.  Gastrointestinal: Negative for abdominal pain, blood in stool, constipation and diarrhea.  Endocrine: Negative for polydipsia and polyuria.  Genitourinary: Negative for dysuria and hematuria.  Skin: Negative for pallor.  Psychiatric/Behavioral: Positive for confusion.    Immunization History  Administered Date(s) Administered  . Influenza Whole 10/12/2011, 10/12/2012  . Influenza-Unspecified  10/25/2013, 10/10/2014, 12/12/2015  . PPD Test 07/07/2010, 03/24/2016  . Pneumococcal Conjugate-13 01/12/2003  . Zoster 01/12/2007   Pertinent  Health Maintenance Due  Topic Date Due  . PNA vac Low Risk Adult (2 of 2 - PPSV23) 01/12/2004  . MAMMOGRAM  08/11/2016  . INFLUENZA VACCINE  08/11/2016  . DEXA SCAN  Completed   Fall Risk  11/11/2015 10/16/2015 07/03/2015 01/28/2015 10/22/2014  Falls in the past year? Yes No No No No  Number falls in past yr: 1 - - - -  Injury with Fall? Yes - - - -  Risk Factor Category  High Fall Risk - - - -  Risk for fall due to : History of fall(s);Impaired balance/gait;Impaired mobility - - - -  Follow up Falls evaluation completed - - - -   Functional Status Survey:    Vitals:  08/13/16 1159  BP: (!) 117/56  Pulse: 89  Resp: 18  Temp: (!) 97.5 F (36.4 C)  TempSrc: Oral  SpO2: 95%  Weight: 127 lb 3.2 oz (57.7 kg)  Height: 5\' 1"  (1.549 m)   Body mass index is 24.03 kg/m. Physical Exam  Constitutional: She is oriented to person, place, and time. She appears well-developed and well-nourished. No distress.  HENT:  Head: Normocephalic and atraumatic.  Mouth/Throat: Oropharynx is clear and moist.  Eyes: Pupils are equal, round, and reactive to light. Conjunctivae are normal.  Neck: Normal range of motion. Neck supple.  Cardiovascular: Normal rate and regular rhythm.   Pulmonary/Chest: Effort normal and breath sounds normal.  Abdominal: Soft. Bowel sounds are normal. She exhibits no distension. There is no tenderness. There is no guarding.  Musculoskeletal: Normal range of motion. She exhibits edema.  Lymphadenopathy:    She has no cervical adenopathy.  Neurological: She is alert and oriented to person, place, and time.  Skin: Skin is warm and dry. She is not diaphoretic.  Psychiatric: She has a normal mood and affect. Her behavior is normal.    Labs reviewed:  Recent Labs  01/19/16 0800  02/18/16 1204  05/20/16 1658 05/24/16 1504   06/21/16 1413 06/24/16 07/19/16 1432  NA 134*  < > 129*  < > 134* 137  < > 134* 134* 132*  K 4.5  < > 4.2  < > 4.1 4.1  < > 4.4 4.5 4.2  CL 98  --  98*  --  100*  --   --   --   --   --   CO2 28  < > 23  < > 28 27  --  27  --  28  GLUCOSE 107*  < > 108*  < > 111* 101  --  117  --  128  BUN 15  < > 13  < > 15 15.3  < > 11.3 9 14.1  CREATININE 0.81  < > 0.71  < > 0.81 1.0  < > 0.9 0.7 1.0  CALCIUM 9.5  < > 8.9  < > 7.6* 8.5  --  8.8  --  8.8  < > = values in this interval not displayed.  Recent Labs  05/24/16 1504 06/15/16 06/21/16 1413 07/19/16 1432  AST 36* 24 47* 65*  ALT 22 13 26  36  ALKPHOS 154* 137* 180* 257*  BILITOT 0.36  --  0.33 0.41  PROT 6.1*  --  5.9* 6.0*  ALBUMIN 3.2*  --  2.9* 3.0*    Recent Labs  05/24/16 1504  06/21/16 1413 07/19/16 1432 08/12/16  WBC 7.2  < > 7.1 6.7 8.1  NEUTROABS 3.3  --  3.3 3.0  --   HGB 11.5*  < > 11.8 11.2* 10.5*  HCT 36.0  < > 36.3 35.6 32*  MCV 92.5  --  88.9 89.4  --   PLT 235  < > 245 214 191  < > = values in this interval not displayed. Lab Results  Component Value Date   TSH 3.215 08/25/2015   Lab Results  Component Value Date   HGBA1C 5.9 01/23/2015   No results found for: CHOL, HDL, LDLCALC, LDLDIRECT, TRIG, CHOLHDL  Significant Diagnostic Results in last 30 days:  Nm Pet Image Restag (ps) Skull Base To Thigh  Result Date: 08/09/2016 CLINICAL DATA:  Subsequent treatment strategy for breast cancer. EXAM: NUCLEAR MEDICINE PET SKULL BASE TO THIGH TECHNIQUE: 8.2 mCi F-18 FDG  was injected intravenously. Full-ring PET imaging was performed from the skull base to thigh after the radiotracer. CT data was obtained and used for attenuation correction and anatomic localization. FASTING BLOOD GLUCOSE:  *Value: 89 mg/dl COMPARISON:  05/10/2016 FINDINGS: NECK: No hypermetabolic lymph nodes in the neck. CHEST: Multiple hypermetabolic left axillary lymph nodes. Corresponding SUV max is equal to 6.4. Previously 1.4. Increased FDG  uptake associated with mediastinal lymph nodes. SUV max associated with right paratracheal node is equal to 5.9. Previously 4.18. SUV max associated with the sub- carinal lymph node is equal to 8.06. Previously 4.38. There is a large right pleural effusion. Similar to previous exam. Chronic paramediastinal consolidation and architectural distortion within the right upper lobe. ABDOMEN/PELVIS: Lesion within the left adrenal gland measures 1.9 cm and has an SUV max equal to 7.9. Previously 1.6 cm with an SUV max of 7.1. Asymmetric uptake within the right adrenal gland has an SUV max equal to 9.9. Previously 4.9. New hypermetabolic activity identified within bilateral adnexal regions. SUV max within the right adnexa is equal to 7.4. On the left the SUV max is equal to 6.7. Increase in abdominal and pelvic ascites. SKELETON: Similar appearance of heterogeneous activity throughout the axial and appendicular skeleton with corresponding diffuse sclerotic bone metastases on CT images. Index hypermetabolic lesion involving the left first rib is equal to 2.53. Previously 3.7. IMPRESSION: 1. Mixed interval response to therapy. 2. New hypermetabolic adenopathy within the left axilla. There has also been increase in FDG uptake associated with mediastinal nodes. 3. Similar FDG uptake associated with left adrenal gland metastasis. There has been increase in hypermetabolism associated with the right adrenal gland. 4. New hypermetabolism associated with bilateral adnexal regions. Cannot rule out metastatic disease to the ovaries. 5. Increase in abdominal and pelvic ascites Electronically Signed   By: Kerby Moors M.D.   On: 08/09/2016 09:41     Assessment/Plan  Anemia unspecified Currently on ibuprofen as needed, daily trimethoprim, anastrozole and aspirin. All of these can contribute to anemia. Likely has anemia of chronic disease with history of breast cancer with metastases, HTN, gerd and chronic medications. Guaiac  stool have been negative. Discontinue ibuprofen order. Change aspirin to EC. Check ferritin level and if low, start iron supplement. Monitor clinically.   gerd Currently on omeprazole 40 mg daily. Guaiac stool negative.    Family/ staff Communication: reviewed care plan with patient and charge nurse  Labs/tests ordered:  Ferritin  Spent 30 minutes with patient of which more than 50% related to direct patient care.     Blanchie Serve, MD  [facility] [phone] (Monday-Friday 8 am - 5 pm) [phone] (afterhours)

## 2016-08-15 NOTE — Progress Notes (Signed)
The drugs is already [facility]  [phone] [phone]     ID: Cassandra Glenn DOB: Oct 04, 1920  MR#: [id number]  XTG#:[id number]  Patient Care Team: Blanchie Serve, MD as PCP - General (Internal Medicine) Melina Modena, [facility], Man X, NP as Nurse Practitioner (Nurse Practitioner) Shon Hough, MD as Consulting Physician (Ophthalmology) Molli Posey, MD as Consulting Physician (Obstetrics and Gynecology) Gerarda Fraction, MD as Consulting Physician (Ophthalmology) Lorelle Gibbs, MD as Consulting Physician (Radiology) Melrose Nakayama, MD as Consulting Physician (Orthopedic Surgery) Gerarda Fraction, MD as Referring Physician (Ophthalmology) Ardis Hughs, MD as Attending Physician (Urology) Bijan Ridgley, Virgie Dad, MD as Consulting Physician (Oncology) Chauncey Cruel, MD OTHER MD:  CHIEF COMPLAINT: 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 Cassandra Glenn continues on anastrozole, with good tolerance.Hot flashes and vaginal dryness are not a major issue. She never developed the arthralgias or myalgias that many patients can experience on this medication. She obtains it at a good price.  She continues on denosumab monthly as well, with no side effects. We always check the calcium prior to administration because of a prior episode of hypocalcemia.  After the last set of labs we noted an upward trend in her tumor markers and also her alkaline phosphatase. We restaged her with a PET scan and we discussed those results today. They show in addition to increased SUVs in the prior areas of metastatic disease, and large right effusion which may or may not be related to the patient's recent history of pneumonia (2).  REVIEW OF  SYSTEMS: Cassandra Glenn is not appreciably short of breath at rest but has been started on oxygen which she wears mostly at night. She still has a little bit of a cough but it is now nonproductive. She did have a fever at the start of the current breathing problems and she tells me she has had 2 episodes of pneumonia in the last several months. She continues an assisted living, where she has been since March 2018. Aside from these problems, she sometimes has swelling in her feet. She denies headaches or vision changes. There have been no falls. She has regular bowel movements on MiraLAX. She self catheterizes 2 daily. She "does not feel good" but denies pain rash or bleeding. A detailed review of systems today was otherwise stable  BREAST CANCER HISTORY: From the original intake note:  I saw Cassandra Glenn approximately approximately 13 years ago when she was diagnosed with right-sided invasive breast cancer, which was treated with radiation and tamoxifen for 5 years. She also had a noninvasive breast cancer removed from the left breast at the same time.  More recently Cassandra Glenn had a chest x-ray 10/22/2015 for evaluation of a cough. The film showed a variety of incidental findings including atherosclerosis of the thoracic aorta and an old lower thoracic compression fracture. There was also however a right apical density which required further evaluation. Accordingly on 10/27/2015 she had a chest CT scan which found chronic areas of scarring in both lungs, not significantly changed from November 2015. However right middle lobe pleural thickening appeared worse, and there was increased nodular pleural thickening along the azygo-esophageal recess, suggestive of an indolent mesothelioma. There was a small cluster of nodules at the medial right lung base which was new from the prior study. There were no bony metastases but  there were multiple chronic compression deformities and degenerative change.   Given these results, a PET  scan was obtained 11/07/2015. There was a hypermetabolic right paratracheal lymph node measuring 1.2 cm, but no additional mediastinal adenopathy. Along the pleural surface of the azygo-esophageal region there was increased metabolic activity with an SUV max of 20.9. There was also intense metabolic activity associated with a thickened left adrenal gland, with an SUV max of 10.8. There was some hypermetabolic activity associated with the right adrenal gland. There were again no bony metastatic disease findings.  With this information the patient returns for further evaluation and treatment.   PAST MEDICAL HISTORY: Past Medical History:  Diagnosis Date  . Abnormal PET scan of mediastinum 11/11/2015  . Abnormalities of the hair 02/29/2012  . Arthritis   . BPV (benign positional vertigo)   . Cancer ([place])   . Cancer antigen 125 (CA 125) elevation 11/27/2013  . Candidiasis of skin and nails 03/19/2011  . 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 Chorioretinal scar, macular 01/23/2015  . Closed fracture of base of neck of femur ([place]) 03/18/2011  . Compression fracture of thoracic vertebra ([facility]) 11/24/2012  . Contact dermatitis and other eczema, due to unspecified cause 05/07/2011  . Depression 07/23/2014  . Diverticulosis of colon (without mention of hemorrhage) 03/19/2011  . Edema 05/07/2011  . HOH (hard of hearing)   . Hypertension 02/24/2016  . Hypopotassemia 03/26/2011  . Hyposmolality and/or hyponatremia 03/19/2011  . Hypotension, unspecified 03/18/2011  . IBS (irritable bowel syndrome) 11/27/2013  . Idiopathic scoliosis 01/27/2016  . Impaired fasting glucose 03/19/2011  . Insomnia, unspecified 03/19/2011  . Kidney infection   . Lumbago 03/19/2011  . Macular degeneration   . Malignant neoplasm of breast (female), unspecified site 10/28/2002  . Mass of right lung 10/23/2015  . Metastasis to adrenal gland ([place]) 11/21/2015   Breast cancer  . Metastatic breast cancer ([place]) 10/27/2004  .  Nonexudative age-related macular degeneration 12/10/2010  . Osteoporosis 08/06/2014  . Osteoporosis, unspecified 03/19/2011  . Other malaise and fatigue 03/19/2011  . Pain in joint, pelvic region and thigh 03/19/2011  . Primary osteoarthritis of right hip 05/28/2014   TOTAL HIP ARTHROPLASTY ANTERIOR APPROACH HARDWARE REMOVAL on 05/28/2014   . Purpura senilis ([place]) 07/31/2013  . Radiculopathy, cervical 03/23/2016   Right C6  . Rib fractures   . Right bundle branch block 08/30/2011  . Shortness of breath   . Spasm of muscle 03/19/2011  . Spinal stenosis, lumbar region, with neurogenic claudication 11/02/2011  . TMJ click 9/51/8841  . Trigger finger (acquired) 11/02/2011  . Unspecified constipation 03/19/2011  . Urinary frequency 04/24/2013  . UTI (urinary tract infection) 04/16/2013   04/16/13 pseudomonas aeruginosa: tx with Cipro 06/15/14 P. Aeruginosa [name] '500mg'$  IM q12h x 7 days.     . Vaginitis and vulvovaginitis 02/29/2012  . Vertigo 08/26/2015    PAST SURGICAL HISTORY: Past Surgical History:  Procedure Laterality Date  . BREAST SURGERY Bilateral 2005-10/27/2004   lumpectomy- Streck,MD  . CATARACT EXTRACTION  2010   bialteral  . EYE SURGERY     cataract  . FRACTURE SURGERY Left 09/1980   ankle  . HARDWARE REMOVAL Right 05/28/2014   Procedure: HARDWARE REMOVAL;  Surgeon: Melrose Nakayama, MD;  Location: [facility];  Service: Orthopedics;  Laterality: Right;  . HIP PINNING,CANNULATED  03/08/2011   Procedure: CANNULATED HIP PINNING;  Surgeon: Johnn Hai, MD;  Location: [facility];  Service: Orthopedics;  Laterality: Right;  . ORIF HIP FRACTURE Right 03/08/2011  Bean,MD  . SKIN CANCER EXCISION Left 10/12/2012   lower leg Dr. [place]  . SKIN LESION EXCISION Right 02/04/2011   Abdomen lesion spongiotic dermatitis-Taffeen, MD  . SQUAMOUS CELL CARCINOMA EXCISION Right 02/04/2011   forearmSyble Creek, MD  . TONSILLECTOMY    . TOTAL HIP ARTHROPLASTY Right 05/28/2014   Procedure: TOTAL HIP  ARTHROPLASTY ANTERIOR APPROACH;  Surgeon: Melrose Nakayama, MD;  Location: [facility];  Service: Orthopedics;  Laterality: Right;    FAMILY HISTORY Family History  Problem Relation Age of Onset  . Heart disease Mother   . Cancer Father   . Emphysema Brother   . Alzheimer's disease Brother   The patient's father died at the age of 3 with colon cancer. The patient's mother died at the age of 13 from heart disease the patient had 2 brothers, no sisters. Both brothers have died, one from emphysema and one from dementia.  GYNECOLOGIC HISTORY:  No LMP recorded. Patient is postmenopausal. Menarche age 83, first live birth age 27, the patient is [place] P2. She stopped having periods around age 73. She never took hormone replacement.  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 She is widowed. She lives in independent living section of [facility] [place]. Her daughter Mardene Celeste lives in [place] (actually [place]) where she teaches. Daughter [name] lives in [place] and is Web designer at [employer]. The patient has 5 grandchildren and 2 step grandchildren. She has one great grandchild and one on the way. She attends [employer]    ADVANCED DIRECTIVES: Shardai has named both her daughters as joint health care power of attorney. She tells me she has a living will "somewhere in the house" and has a DO NOT RESUSCITATE order written at [facility].   HEALTH MAINTENANCE: Social History  Substance Use Topics  . Smoking status: Never Smoker  . Smokeless tobacco: Never Used  . Alcohol use 0.0 oz/week     Comment: occasional glass     Colonoscopy:  PAP:  Bone density:   Allergies  Allergen Reactions  . Macrodantin [Nitrofurantoin] Other (See Comments)    Reaction:  Unknown     Current Outpatient Prescriptions  Medication Sig Dispense Refill  . acetaminophen (TYLENOL) 500 MG tablet Take 500 mg  by mouth every 6 (six) hours as needed for moderate pain.     Marland Kitchen ALPRAZolam (XANAX) 0.5 MG tablet Take 0.5 mg by mouth daily. In addition to scheduled daily dose pt may take 2 x day prn    . Amino Acids-Protein Hydrolys (FEEDING SUPPLEMENT, PRO-STAT SUGAR FREE 64,) LIQD Take 30 mLs by mouth daily. Stop date 08/30/16    . anastrozole (ARIMIDEX) 1 MG tablet Take 1 tablet (1 mg total) by mouth daily. 90 tablet 4  . antiseptic oral rinse (BIOTENE) LIQD 15 mLs by Mouth Rinse route 2 (two) times daily as needed for dry mouth.     Marland Kitchen aspirin 81 MG chewable tablet Chew 81 mg by mouth daily.     . Bilberry 150 MG CAPS Take 150 mg by mouth daily.    . Calcium Carbonate-Vitamin D (CALCIUM 600+D) 600-400 MG-UNIT tablet Take 1 tablet by mouth 2 (two) times daily.    . Cholecalciferol 1000 units tablet Take 1,000 Units by mouth daily.    . Cranberry-Cholecalciferol (SUPER CRANBERRY/VITAMIN D3) 4200-500 MG-UNIT CAPS Take 1 capsule by mouth daily.    . Diphenhyd-Benzeth-Menth-Zn Ace (CALAGEL MAXIMUM STRENGTH EX) Apply 1 application  topically as directed. 2 x day    . furosemide (LASIX) 40 MG tablet Take 1 tablet (40 mg total) by mouth daily. Hold if SBP < 110 30 tablet   . Glucosamine-Chondroitin (GLUCOSAMINE CHONDR COMPLEX PO) Take 2 tablets by mouth daily. for joints    . guaiFENesin (MUCINEX) 600 MG 12 hr tablet Take 600 mg by mouth 2 (two) times daily as needed for cough.    . Lactobacillus Rhamnosus, GG, (CULTURELLE PO) Take 1 tablet by mouth daily.    Marland Kitchen loperamide (IMODIUM A-D) 2 MG tablet Take 4 mg by mouth as needed for diarrhea or loose stools. Give 4 mg by mouth as the initial dose, then 2 mg by mouth after each loose stool.    . magnesium hydroxide (MILK OF MAGNESIA) 400 MG/5ML suspension Take 30 mLs by mouth as needed for mild constipation.    . meclizine (ANTIVERT) 25 MG tablet Take 1 tablet (25 mg total) by mouth 3 (three) times daily as needed for dizziness. 30 tablet 0  . Meth-Hyo-M Bl-Na Phos-Ph Sal  (URIBEL) 118 MG CAPS Take 1 capsule by mouth 3 (three) times daily as needed.    . mirtazapine (REMERON) 7.5 MG tablet Take 7.5 mg by mouth at bedtime.    . Multiple Vitamins-Minerals (PRESERVISION/LUTEIN) CAPS Take 1 capsule by mouth 2 (two) times daily.    . NON FORMULARY 1 bottle of gatorade as needed at residents request    . Omega-3 Fatty Acids (FISH OIL) 1000 MG CAPS Take 1 capsule by mouth daily.    Marland Kitchen omeprazole (PRILOSEC) 40 MG capsule Take 40 mg by mouth daily.    . ondansetron (ZOFRAN) 4 MG tablet Take 4 mg by mouth daily.    . ondansetron (ZOFRAN-ODT) 4 MG disintegrating tablet Take 4 mg by mouth every 8 (eight) hours as needed for nausea or vomiting.    . polycarbophil (FIBERCON) 625 MG tablet Take 625 mg by mouth 2 (two) times daily.    . polyethylene glycol (MIRALAX / GLYCOLAX) packet Take 17 g by mouth at bedtime. Pt also uses once daily as needed for constipation.    . traMADol (ULTRAM) 50 MG tablet Take 50 mg by mouth 4 (four) times daily -  with meals and at bedtime.     Marland Kitchen trimethoprim (TRIMPEX) 100 MG tablet Take 100 mg by mouth at bedtime.    Marland Kitchen ibuprofen (ADVIL,MOTRIN) 400 MG tablet Take 400 mg by mouth as needed.    . nystatin (MYCOSTATIN/NYSTOP) powder Apply topically daily. 1 application to bilateral breast (under)     No current facility-administered medications for this visit.     OBJECTIVE: older White woman Not wearing oxygen  Vitals:   08/16/16 1458  BP: (!) 155/65  Pulse: 94  Resp: 18  Temp: 97.6 F (36.4 C)     Body mass index is 24.64 kg/m.    ECOG FS:2 - Symptomatic, <50% confined to bed   Sclerae unicteric, pupils round and equal Oropharynx clear and moist No cervical or supraclavicular adenopathy Lungs no rales or rhonchi; no breath sounds on right Heart regular rate and rhythm Abd soft, nontender, positive bowel sounds MSK no focal spinal tenderness, no upper extremity lymphedema Neuro: nonfocal, well oriented, appropriate affect Breasts:  Deferred   LAB RESULTS:  CMP     Component Value Date/Time   NA 131 (L) 08/16/2016 1441   K 4.0 08/16/2016 1441   CL 100 (L) 05/20/2016 1658   CO2 28 08/16/2016 1441   GLUCOSE  147 (H) 08/16/2016 1441   BUN 19.1 08/16/2016 1441   CREATININE 1.0 08/16/2016 1441   CALCIUM 8.4 08/16/2016 1441   PROT 5.5 (L) 08/16/2016 1441   ALBUMIN 2.8 (L) 08/16/2016 1441   AST 79 (H) 08/16/2016 1441   ALT 41 08/16/2016 1441   ALKPHOS 300 (H) 08/16/2016 1441   BILITOT 0.57 08/16/2016 1441   GFRNONAA >60 05/20/2016 1658   GFRNONAA 63 01/19/2016 0800   GFRAA >60 05/20/2016 1658   GFRAA 72 01/19/2016 0800    INo results found for: SPEP, UPEP  Lab Results  Component Value Date   WBC 8.0 08/16/2016   NEUTROABS 4.5 08/16/2016   HGB 10.6 (L) 08/16/2016   HCT 32.8 (L) 08/16/2016   MCV 90.4 08/16/2016   PLT 178 08/16/2016      Chemistry      Component Value Date/Time   NA 131 (L) 08/16/2016 1441   K 4.0 08/16/2016 1441   CL 100 (L) 05/20/2016 1658   CO2 28 08/16/2016 1441   BUN 19.1 08/16/2016 1441   CREATININE 1.0 08/16/2016 1441   GLU 115 06/24/2016      Component Value Date/Time   CALCIUM 8.4 08/16/2016 1441   ALKPHOS 300 (H) 08/16/2016 1441   AST 79 (H) 08/16/2016 1441   ALT 41 08/16/2016 1441   BILITOT 0.57 08/16/2016 1441       Lab Results  Component Value Date   LABCA2 19 11/07/2007    No components found for: ALPFX902  No results for input(s): INR in the last 168 hours.  Urinalysis    Component Value Date/Time   COLORURINE YELLOW 05/20/2016 1635   APPEARANCEUR HAZY (A) 05/20/2016 1635   LABSPEC 1.013 05/20/2016 1635   LABSPEC 1.015 03/11/2016 1307   PHURINE 6.0 05/20/2016 1635   GLUCOSEU NEGATIVE 05/20/2016 1635   GLUCOSEU Negative 03/11/2016 1307   HGBUR NEGATIVE 05/20/2016 1635   BILIRUBINUR NEGATIVE 05/20/2016 1635   BILIRUBINUR Negative 03/11/2016 1307   KETONESUR NEGATIVE 05/20/2016 1635   PROTEINUR NEGATIVE 05/20/2016 1635   UROBILINOGEN 0.2  03/11/2016 1307   NITRITE NEGATIVE 05/20/2016 1635   LEUKOCYTESUR SMALL (A) 05/20/2016 1635   LEUKOCYTESUR Color Interference 03/11/2016 1307     STUDIES: Nm Pet Image Restag (ps) Skull Base To Thigh  Result Date: 08/09/2016 CLINICAL DATA:  Subsequent treatment strategy for breast cancer. EXAM: NUCLEAR MEDICINE PET SKULL BASE TO THIGH TECHNIQUE: 8.2 mCi F-18 FDG was injected intravenously. Full-ring PET imaging was performed from the skull base to thigh after the radiotracer. CT data was obtained and used for attenuation correction and anatomic localization. FASTING BLOOD GLUCOSE:  *Value: 89 mg/dl COMPARISON:  05/10/2016 FINDINGS: NECK: No hypermetabolic lymph nodes in the neck. CHEST: Multiple hypermetabolic left axillary lymph nodes. Corresponding SUV max is equal to 6.4. Previously 1.4. Increased FDG uptake associated with mediastinal lymph nodes. SUV max associated with right paratracheal node is equal to 5.9. Previously 4.18. SUV max associated with the sub- carinal lymph node is equal to 8.06. Previously 4.38. There is a large right pleural effusion. Similar to previous exam. Chronic paramediastinal consolidation and architectural distortion within the right upper lobe. ABDOMEN/PELVIS: Lesion within the left adrenal gland measures 1.9 cm and has an SUV max equal to 7.9. Previously 1.6 cm with an SUV max of 7.1. Asymmetric uptake within the right adrenal gland has an SUV max equal to 9.9. Previously 4.9. New hypermetabolic activity identified within bilateral adnexal regions. SUV max within the right adnexa is equal to 7.4. On the  left the SUV max is equal to 6.7. Increase in abdominal and pelvic ascites. SKELETON: Similar appearance of heterogeneous activity throughout the axial and appendicular skeleton with corresponding diffuse sclerotic bone metastases on CT images. Index hypermetabolic lesion involving the left first rib is equal to 2.53. Previously 3.7. IMPRESSION: 1. Mixed interval response  to therapy. 2. New hypermetabolic adenopathy within the left axilla. There has also been increase in FDG uptake associated with mediastinal nodes. 3. Similar FDG uptake associated with left adrenal gland metastasis. There has been increase in hypermetabolism associated with the right adrenal gland. 4. New hypermetabolism associated with bilateral adnexal regions. Cannot rule out metastatic disease to the ovaries. 5. Increase in abdominal and pelvic ascites Electronically Signed   By: Kerby Moors M.D.   On: 08/09/2016 09:41    ELIGIBLE FOR AVAILABLE RESEARCH PROTOCOL: no  ASSESSMENT: 50 y.o. [place] woman residing at [facility] [place]  (1) status post right lumpectomy on 10/27/2004 for an invasive lobular carcinoma, grade 1, estrogen and progesterone receptor positive, Ki-67 3%, HER-2 negative, and so T2N2, treated with radiation and tamoxifen for 5 years  (a) Also status post left lumpectomy for ductal carcinoma in situ  METASTATIC DISEASE: November 2017 (2) chest CT scan and PET scan obtained October 2017 show a hot 1.2 cm right paratracheal lymph node, a rim of activity along the pleural surface of the a zygo-esophageal region, and bilateral adrenal metastases, left greater than right   (a) biopsy of the left adrenal gland 12/03/2015 shows metastatic carcinoma, estrogen and progesterone receptor positive, HER-2 nonamplified  (b) CA-27-29 is informative  (3) tamoxifen started 01/20/2015, discontinued March 2018 with concerns regarding progression  (4) anastrozole started March 2018  (a) Xgeva started 03/29/2016, to be repeated monthly, held after one dose because of hypocalcemia, resumed 06/21/2016  PLAN: I spent approximately 30 minutes with Rosaria Ferries with most of that time spent discussing her complex problems. She is tolerating the anastrozole well, and it initially did control her cancer, but now there is evidence of disease progression first through the rising tumor marker and secondly  through the recent PET scan.  We discussed the pet scan results in detail. She understands the large pleural effusion on the right May or may not be malignant. It could be parapneumonic secondary to her recent episodes of pneumonia.  I think it will be important to know which is which as it will help guide her future treatment. Accordingly she will undergo right thoracentesis within the week. We will request cytology on that specimen. I am hopeful this will help her breathing. I'm also hopeful it will not quickly reaccumulated.  As far as the breast cancer is concerned I think this is a good time to add fulvestrant. We discussed the possible toxicities, side effects and complications of this agent. She will have her first dose on August 21 and then her next dose on September 4 when she will have her next dose of denosumab/Xgeva. Thereafter we will follow the tumor marker and assuming it trends downward no further changes will be needed.  Nayelis and her daughter have a good understanding of this plan. They know to call for any problems that may develop before the next visit here.    Chauncey Cruel, MD   08/16/2016 9:17 PM Medical Oncology and Hematology [facility] [address] [place], [place] [address] Tel. [phone]    Fax. [phone]

## 2016-08-16 ENCOUNTER — Ambulatory Visit (HOSPITAL_BASED_OUTPATIENT_CLINIC_OR_DEPARTMENT_OTHER): Payer: Medicare Other | Admitting: Oncology

## 2016-08-16 ENCOUNTER — Ambulatory Visit (HOSPITAL_BASED_OUTPATIENT_CLINIC_OR_DEPARTMENT_OTHER): Payer: Medicare Other

## 2016-08-16 ENCOUNTER — Other Ambulatory Visit (HOSPITAL_BASED_OUTPATIENT_CLINIC_OR_DEPARTMENT_OTHER): Payer: Medicare Other

## 2016-08-16 VITALS — BP 155/65 | HR 94 | Temp 97.6°F | Resp 18 | Ht 61.0 in | Wt 130.4 lb

## 2016-08-16 DIAGNOSIS — M818 Other osteoporosis without current pathological fracture: Secondary | ICD-10-CM

## 2016-08-16 DIAGNOSIS — C50912 Malignant neoplasm of unspecified site of left female breast: Secondary | ICD-10-CM | POA: Diagnosis not present

## 2016-08-16 DIAGNOSIS — C50919 Malignant neoplasm of unspecified site of unspecified female breast: Secondary | ICD-10-CM | POA: Diagnosis not present

## 2016-08-16 DIAGNOSIS — C7972 Secondary malignant neoplasm of left adrenal gland: Secondary | ICD-10-CM

## 2016-08-16 DIAGNOSIS — C7951 Secondary malignant neoplasm of bone: Secondary | ICD-10-CM | POA: Diagnosis not present

## 2016-08-16 DIAGNOSIS — C50911 Malignant neoplasm of unspecified site of right female breast: Secondary | ICD-10-CM | POA: Diagnosis present

## 2016-08-16 DIAGNOSIS — E871 Hypo-osmolality and hyponatremia: Secondary | ICD-10-CM | POA: Diagnosis not present

## 2016-08-16 DIAGNOSIS — G893 Neoplasm related pain (acute) (chronic): Secondary | ICD-10-CM

## 2016-08-16 DIAGNOSIS — M810 Age-related osteoporosis without current pathological fracture: Secondary | ICD-10-CM

## 2016-08-16 DIAGNOSIS — D72829 Elevated white blood cell count, unspecified: Secondary | ICD-10-CM | POA: Diagnosis not present

## 2016-08-16 DIAGNOSIS — S2242XG Multiple fractures of ribs, left side, subsequent encounter for fracture with delayed healing: Secondary | ICD-10-CM

## 2016-08-16 DIAGNOSIS — Z170 Estrogen receptor positive status [ER+]: Principal | ICD-10-CM

## 2016-08-16 DIAGNOSIS — D500 Iron deficiency anemia secondary to blood loss (chronic): Secondary | ICD-10-CM | POA: Diagnosis not present

## 2016-08-16 LAB — CBC WITH DIFFERENTIAL/PLATELET
BASO%: 0.2 % (ref 0.0–2.0)
Basophils Absolute: 0 10*3/uL (ref 0.0–0.1)
EOS%: 2.4 % (ref 0.0–7.0)
Eosinophils Absolute: 0.2 10*3/uL (ref 0.0–0.5)
HEMATOCRIT: 32.8 % — AB (ref 34.8–46.6)
HGB: 10.6 g/dL — ABNORMAL LOW (ref 11.6–15.9)
LYMPH#: 2.3 10*3/uL (ref 0.9–3.3)
LYMPH%: 28.9 % (ref 14.0–49.7)
MCH: 29.2 pg (ref 25.1–34.0)
MCHC: 32.3 g/dL (ref 31.5–36.0)
MCV: 90.4 fL (ref 79.5–101.0)
MONO#: 1 10*3/uL — AB (ref 0.1–0.9)
MONO%: 12.9 % (ref 0.0–14.0)
NEUT%: 55.6 % (ref 38.4–76.8)
NEUTROS ABS: 4.5 10*3/uL (ref 1.5–6.5)
PLATELETS: 178 10*3/uL (ref 145–400)
RBC: 3.63 10*6/uL — AB (ref 3.70–5.45)
RDW: 17.3 % — AB (ref 11.2–14.5)
WBC: 8 10*3/uL (ref 3.9–10.3)

## 2016-08-16 LAB — COMPREHENSIVE METABOLIC PANEL
ALT: 41 U/L (ref 0–55)
ANION GAP: 6 meq/L (ref 3–11)
AST: 79 U/L — ABNORMAL HIGH (ref 5–34)
Albumin: 2.8 g/dL — ABNORMAL LOW (ref 3.5–5.0)
Alkaline Phosphatase: 300 U/L — ABNORMAL HIGH (ref 40–150)
BILIRUBIN TOTAL: 0.57 mg/dL (ref 0.20–1.20)
BUN: 19.1 mg/dL (ref 7.0–26.0)
CO2: 28 meq/L (ref 22–29)
CREATININE: 1 mg/dL (ref 0.6–1.1)
Calcium: 8.4 mg/dL (ref 8.4–10.4)
Chloride: 97 mEq/L — ABNORMAL LOW (ref 98–109)
EGFR: 48 mL/min/{1.73_m2} — ABNORMAL LOW (ref >90)
Glucose: 147 mg/dl — ABNORMAL HIGH (ref 70–140)
Potassium: 4 mEq/L (ref 3.5–5.1)
Sodium: 131 mEq/L — ABNORMAL LOW (ref 136–145)
TOTAL PROTEIN: 5.5 g/dL — AB (ref 6.4–8.3)

## 2016-08-16 LAB — CBC AND DIFFERENTIAL
HEMATOCRIT: 35 — AB (ref 36–46)
Hemoglobin: 11.5 — AB (ref 12.0–16.0)
Platelets: 220 (ref 150–399)
WBC: 9

## 2016-08-16 MED ORDER — DENOSUMAB 120 MG/1.7ML ~~LOC~~ SOLN
120.0000 mg | Freq: Once | SUBCUTANEOUS | Status: AC
  Administered 2016-08-16: 120 mg via SUBCUTANEOUS
  Filled 2016-08-16: qty 1.7

## 2016-08-16 NOTE — Patient Instructions (Signed)

## 2016-08-17 LAB — CANCER ANTIGEN 27.29: CA 27.29: 369.3 U/mL — ABNORMAL HIGH (ref 0.0–38.6)

## 2016-08-20 ENCOUNTER — Encounter: Payer: Self-pay | Admitting: Family

## 2016-08-20 ENCOUNTER — Non-Acute Institutional Stay: Payer: Medicare Other | Admitting: Family

## 2016-08-20 DIAGNOSIS — B370 Candidal stomatitis: Secondary | ICD-10-CM

## 2016-08-20 MED ORDER — NYSTATIN 100000 UNIT/ML MT SUSP: 5.0000 mL | Freq: Four times a day (QID) | OROMUCOSAL | Status: AC

## 2016-08-20 NOTE — Progress Notes (Signed)
Location:  [facility] Room Number: 8 Place of Service:  ALF [phone]) Provider: Davine Sweney FNP-C  Blanchie Serve, MD  Patient Care Team: Blanchie Serve, MD as PCP - General (Internal Medicine) Melina Modena, [facility], Man X, NP as Nurse Practitioner (Nurse Practitioner) Shon Hough, MD as Consulting Physician (Ophthalmology) Molli Posey, MD as Consulting Physician (Obstetrics and Gynecology) Gerarda Fraction, MD as Consulting Physician (Ophthalmology) Lorelle Gibbs, MD as Consulting Physician (Radiology) Melrose Nakayama, MD as Consulting Physician (Orthopedic Surgery) Gerarda Fraction, MD as Referring Physician (Ophthalmology) Ardis Hughs, MD as Attending Physician (Urology) Magrinat, Virgie Dad, MD as Consulting Physician (Oncology)  Extended Emergency Contact Information Primary Emergency Contact: Black,Emily Address: [address]          [place], [place] [address] [name] of [place] Phone: [phone] Mobile Phone: [phone] Relation: Daughter Secondary Emergency Contact: Marcine Matar [place] of [place] Mobile Phone: [phone] Relation: Daughter  Code Status:  DNR Goals of care: Advanced Directive information Advanced Directives 08/20/2016  Does Patient Have a Medical Advance Directive? Yes  Type of Advance Directive Out of facility DNR (pink MOST or yellow form);Living will;Healthcare Power of Attorney  Does patient want to make changes to medical advance directive? -  Copy of [place] in Chart? Yes  Would patient like information on creating a medical advance directive? -  Pre-existing out of facility DNR order (yellow form or pink MOST form) -     Chief Complaint  Patient presents with  . Acute Visit    ? thrush (tongue pain with white film)    HPI:  Pt is a 83 y.o. female seen today at [facility] for an acute visit for evaluation of whitish patches on her tongue. She  has a significant medical history of metastatic breast cancer currently on Arimidex.she is also on trimpex for urinary tract infections prophylaxis. She denies any other new acute issues this visit.     Past Medical History:  Diagnosis Date  . Abnormal PET scan of mediastinum 11/11/2015  . Abnormalities of the hair 02/29/2012  . Arthritis   . BPV (benign positional vertigo)   . Cancer ([place])   . Cancer antigen 125 (CA 125) elevation 11/27/2013  . Candidiasis of skin and nails 03/19/2011  . 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 Chorioretinal scar, macular 01/23/2015  . Closed fracture of base of neck of femur ([place]) 03/18/2011  . Compression fracture of thoracic vertebra ([facility]) 11/24/2012  . Contact dermatitis and other eczema, due to unspecified cause 05/07/2011  . Depression 07/23/2014  . Diverticulosis of colon (without mention of hemorrhage) 03/19/2011  . Edema 05/07/2011  . HOH (hard of hearing)   . Hypertension 02/24/2016  . Hypopotassemia 03/26/2011  . Hyposmolality and/or hyponatremia 03/19/2011  . Hypotension, unspecified 03/18/2011  . IBS (irritable bowel syndrome) 11/27/2013  . Idiopathic scoliosis 01/27/2016  . Impaired fasting glucose 03/19/2011  . Insomnia, unspecified 03/19/2011  . Kidney infection   . Lumbago 03/19/2011  . Macular degeneration   . Malignant neoplasm of breast (female), unspecified site 10/28/2002  . Mass of right lung 10/23/2015  . Metastasis to adrenal gland ([place]) 11/21/2015   Breast cancer  . Metastatic breast cancer ([facility]) 10/27/2004  . Nonexudative age-related macular degeneration 12/10/2010  . Osteoporosis 08/06/2014  . Osteoporosis, unspecified 03/19/2011  . Other malaise and fatigue 03/19/2011  . Pain in joint, pelvic region and thigh 03/19/2011  . Primary osteoarthritis of right hip 05/28/2014   TOTAL HIP  ARTHROPLASTY ANTERIOR APPROACH HARDWARE REMOVAL on 05/28/2014   . Purpura senilis ([facility]) 07/31/2013  . Radiculopathy, cervical 03/23/2016    Right C6  . Rib fractures   . Right bundle branch block 08/30/2011  . Shortness of breath   . Spasm of muscle 03/19/2011  . Spinal stenosis, lumbar region, with neurogenic claudication 11/02/2011  . TMJ click 02/12/5425  . Trigger finger (acquired) 11/02/2011  . Unspecified constipation 03/19/2011  . Urinary frequency 04/24/2013  . UTI (urinary tract infection) 04/16/2013   04/16/13 pseudomonas aeruginosa: tx with Cipro 06/15/14 P. Aeruginosa Fortaz 524m IM q12h x 7 days.     . Vaginitis and vulvovaginitis 02/29/2012  . Vertigo 08/26/2015   Past Surgical History:  Procedure Laterality Date  . BREAST SURGERY Bilateral 2005-10/27/2004   lumpectomy- Streck,MD  . CATARACT EXTRACTION  2010   bialteral  . EYE SURGERY     cataract  . FRACTURE SURGERY Left 09/1980   ankle  . HARDWARE REMOVAL Right 05/28/2014   Procedure: HARDWARE REMOVAL;  Surgeon: PMelrose Nakayama MD;  Location: [facility]  Service: Orthopedics;  Laterality: Right;  . HIP PINNING,CANNULATED  03/08/2011   Procedure: CANNULATED HIP PINNING;  Surgeon: JJohnn Hai MD;  Location: [facility];  Service: Orthopedics;  Laterality: Right;  . ORIF HIP FRACTURE Right 03/08/2011   Bean,MD  . SKIN CANCER EXCISION Left 10/12/2012   lower leg Dr. [place] . SKIN LESION EXCISION Right 02/04/2011   Abdomen lesion spongiotic dermatitis-Taffeen, MD  . SQUAMOUS CELL CARCINOMA EXCISION Right 02/04/2011   forearm-[place] MD  . TONSILLECTOMY    . TOTAL HIP ARTHROPLASTY Right 05/28/2014   Procedure: TOTAL HIP ARTHROPLASTY ANTERIOR APPROACH;  Surgeon: PMelrose Nakayama MD;  [facility]  Service: Orthopedics;  Laterality: Right;    Allergies  Allergen Reactions  . Macrodantin [Nitrofurantoin] Other (See Comments)    Reaction:  Unknown     Allergies as of 08/20/2016      Reactions   Macrodantin [nitrofurantoin] Other (See Comments)   Reaction:  Unknown       Medication List       Accurate as of 08/20/16 11:28 AM. Always use your most recent  med list.          acetaminophen 500 MG tablet Commonly known as:  TYLENOL Take 500 mg by mouth every 6 (six) hours as needed for moderate pain.   ALPRAZolam 0.5 MG tablet Commonly known as:  XANAX Take 0.5 mg by mouth daily. In addition to scheduled daily dose pt may take 2 x day prn   anastrozole 1 MG tablet Commonly known as:  ARIMIDEX Take 1 tablet (1 mg total) by mouth daily.   antiseptic oral rinse Liqd 15 mLs by Mouth Rinse route 2 (two) times daily as needed for dry mouth.   aspirin 81 MG chewable tablet Chew 81 mg by mouth daily.   Bilberry 150 MG Caps Take 150 mg by mouth daily.   CALAGEL MAXIMUM STRENGTH EX Apply 1 application topically as directed. 2 x day   CALCIUM 600+D 600-400 MG-UNIT tablet Generic drug:  Calcium Carbonate-Vitamin D Take 1 tablet by mouth 2 (two) times daily.   Cholecalciferol 1000 units tablet Take 1,000 Units by mouth daily.   CULTURELLE PO Take 1 tablet by mouth daily.   feeding supplement (PRO-STAT SUGAR FREE 64) Liqd Take 30 mLs by mouth daily. Stop date 08/30/16   Fish Oil 1000 MG Caps Take 1 capsule by mouth daily.   furosemide 40 MG tablet  Commonly known as:  LASIX Take 1 tablet (40 mg total) by mouth daily. Hold if SBP < 110   GLUCOSAMINE CHONDR COMPLEX PO Take 2 tablets by mouth daily. for joints   guaiFENesin 600 MG 12 hr tablet Commonly known as:  MUCINEX Take 600 mg by mouth 2 (two) times daily as needed for cough.   loperamide 2 MG tablet Commonly known as:  IMODIUM A-D Take 4 mg by mouth as needed for diarrhea or loose stools. Give 4 mg by mouth as the initial dose, then 2 mg by mouth after each loose stool.   magnesium hydroxide 400 MG/5ML suspension Commonly known as:  MILK OF MAGNESIA Take 30 mLs by mouth as needed for mild constipation.   meclizine 25 MG tablet Commonly known as:  ANTIVERT Take 1 tablet (25 mg total) by mouth 3 (three) times daily as needed for dizziness.   mirtazapine 7.5 MG  tablet Commonly known as:  REMERON Take 7.5 mg by mouth at bedtime.   NON FORMULARY 1 bottle of gatorade as needed at residents request   nystatin powder Commonly known as:  MYCOSTATIN/NYSTOP Apply topically daily. 1 application to bilateral breast (under)   omeprazole 40 MG capsule Commonly known as:  PRILOSEC Take 40 mg by mouth daily.   ondansetron 4 MG disintegrating tablet Commonly known as:  ZOFRAN-ODT Take 4 mg by mouth every 8 (eight) hours as needed for nausea or vomiting.   polycarbophil 625 MG tablet Commonly known as:  FIBERCON Take 625 mg by mouth 2 (two) times daily.   polyethylene glycol packet Commonly known as:  MIRALAX / GLYCOLAX Take 17 g by mouth at bedtime. Pt also uses once daily as needed for constipation.   PRESERVISION/LUTEIN Caps Take 1 capsule by mouth 2 (two) times daily.   SUPER CRANBERRY/VITAMIN D3 4200-500 MG-UNIT Caps Generic drug:  Cranberry-Cholecalciferol Take 1 capsule by mouth daily.   traMADol 50 MG tablet Commonly known as:  ULTRAM Take 50 mg by mouth 4 (four) times daily -  with meals and at bedtime.   trimethoprim 100 MG tablet Commonly known as:  TRIMPEX Take 100 mg by mouth at bedtime.   URIBEL 118 MG Caps Take 1 capsule by mouth 3 (three) times daily as needed.       Review of Systems  Constitutional: Positive for fatigue. Negative for activity change, appetite change, chills, diaphoresis and fever.  HENT: Positive for hearing loss. Negative for mouth sores, rhinorrhea, sinus pain, sinus pressure, sneezing, sore throat and trouble swallowing.        Chronic dry mouth   Eyes: Negative for pain, discharge, redness and itching.  Respiratory: Negative for chest tightness, shortness of breath and wheezing.   Cardiovascular: Positive for leg swelling. Negative for chest pain and palpitations.  Gastrointestinal: Negative for abdominal distention, abdominal pain, constipation, diarrhea, nausea and vomiting.  Skin: Negative  for color change, pallor and rash.       Whitish patches on tongue   Neurological:       Generalized weakness   Psychiatric/Behavioral: Negative for agitation, confusion, hallucinations and sleep disturbance. The patient is not nervous/anxious.     Immunization History  Administered Date(s) Administered  . Influenza Whole 10/12/2011, 10/12/2012  . Influenza-Unspecified 10/23/2013, 10/10/2014, 12/12/2015  . PPD Test 07/07/2010, 03/24/2016  . Pneumococcal Conjugate-13 01/12/2003  . Zoster 01/12/2007   Pertinent  Health Maintenance Due  Topic Date Due  . PNA vac Low Risk Adult (2 of 2 - PPSV23) 01/12/2004  . MAMMOGRAM  08/11/2016  . INFLUENZA VACCINE  08/11/2016  . DEXA SCAN  Completed   Fall Risk  11/11/2015 10/16/2015 07/03/2015 01/28/2015 10/22/2014  Falls in the past year? Yes No No No No  Number falls in past yr: 1 - - - -  Injury with Fall? Yes - - - -  Risk Factor Category  High Fall Risk - - - -  Risk for fall due to : History of fall(s);Impaired balance/gait;Impaired mobility - - - -  Follow up Falls evaluation completed - - - -    Vitals:   08/20/16 1045  BP: 124/69  Pulse: 83  Resp: 18  Temp: 98.2 F (36.8 C)  SpO2: 90%  Weight: 127 lb 3.2 oz (57.7 kg)  Height: _0  (1.549 m)   Body mass index is 24.03 kg/m. Physical Exam  Constitutional: She is oriented to person, place, and time. She appears well-developed and well-nourished. No distress.  HENT:  Head: Normocephalic.  Mouth/Throat: No oropharyngeal exudate.  Whitish plaque and dryness noted on tongue.  Eyes: Pupils are equal, round, and reactive to light. Conjunctivae and EOM are normal. Right eye exhibits no discharge. Left eye exhibits no discharge. No scleral icterus.  Neck: No JVD present. No thyromegaly present.  Cardiovascular: Normal rate, regular rhythm, normal heart sounds and intact distal pulses.  Exam reveals no gallop and no friction rub.   No murmur heard. Pulmonary/Chest: Effort normal and  breath sounds normal. No respiratory distress. She has no wheezes. She has no rales.     Abdominal: Soft. Bowel sounds are normal. She exhibits no distension. There is no tenderness. There is no rebound and no guarding.  Lymphadenopathy:    She has no cervical adenopathy.  Neurological: She is oriented to person, place, and time. Coordination normal.  Skin: Skin is warm and dry. No rash noted. No erythema. There is pallor.  Psychiatric: She has a normal mood and affect.    Labs reviewed:  Recent Labs  01/19/16 0800  02/18/16 1204  05/20/16 1658  06/21/16 1413 06/24/16 07/19/16 1432 08/16/16 1441  NA 134*  < > 129*  < > 134*  < > 134* 134* 132* 131*  K 4.5  < > 4.2  < > 4.1  < > 4.4 4.5 4.2 4.0  CL 98  --  98*  --  100*  --   --   --   --   --   CO2 28  < > 23  < > 28  < > 27  --  28 28  GLUCOSE 107*  < > 108*  < > 111*  < > 117  --  128 147*  BUN 15  < > 13  < > 15  < > 11.3 9 14.1 19.1  CREATININE 0.81  < > 0.71  < > 0.81  < > 0.9 0.7 1.0 1.0  CALCIUM 9.5  < > 8.9  < > 7.6*  < > 8.8  --  8.8 8.4  < > = values in this interval not displayed.  Recent Labs  06/21/16 1413 07/19/16 1432 08/16/16 1441  AST 47* 65* 79*  ALT 26 36 41  ALKPHOS 180* 257* 300*  BILITOT 0.33 0.41 0.57  PROT 5.9* 6.0* 5.5*  ALBUMIN 2.9* 3.0* 2.8*    Recent Labs  06/21/16 1413 07/19/16 1432 08/12/16 08/16/16 08/16/16 1441  WBC 7.1 6.7 8.1 9.0 8.0  NEUTROABS 3.3 3.0  --   --  4.5  HGB 11.8 11.2* 10.5*  11.5* 10.6*  HCT 36.3 35.6 32* 35* 32.8*  MCV 88.9 89.4  --   --  90.4  PLT 245 214 191 220 178   Lab Results  Component Value Date   TSH 3.215 08/25/2015   Lab Results  Component Value Date   HGBA1C 5.9 01/23/2015   Significant Diagnostic Results in last 30 days:  Nm Pet Image Restag (ps) Skull Base To Thigh  Result Date: 08/09/2016 CLINICAL DATA:  Subsequent treatment strategy for breast cancer. EXAM: NUCLEAR MEDICINE PET SKULL BASE TO THIGH TECHNIQUE: 8.2 mCi F-18 FDG was injected  intravenously. Full-ring PET imaging was performed from the skull base to thigh after the radiotracer. CT data was obtained and used for attenuation correction and anatomic localization. FASTING BLOOD GLUCOSE:  *Value: 89 mg/dl COMPARISON:  05/10/2016 FINDINGS: NECK: No hypermetabolic lymph nodes in the neck. CHEST: Multiple hypermetabolic left axillary lymph nodes. Corresponding SUV max is equal to 6.4. Previously 1.4. Increased FDG uptake associated with mediastinal lymph nodes. SUV max associated with right paratracheal node is equal to 5.9. Previously 4.18. SUV max associated with the sub- carinal lymph node is equal to 8.06. Previously 4.38. There is a large right pleural effusion. Similar to previous exam. Chronic paramediastinal consolidation and architectural distortion within the right upper lobe. ABDOMEN/PELVIS: Lesion within the left adrenal gland measures 1.9 cm and has an SUV max equal to 7.9. Previously 1.6 cm with an SUV max of 7.1. Asymmetric uptake within the right adrenal gland has an SUV max equal to 9.9. Previously 4.9. New hypermetabolic activity identified within bilateral adnexal regions. SUV max within the right adnexa is equal to 7.4. On the left the SUV max is equal to 6.7. Increase in abdominal and pelvic ascites. SKELETON: Similar appearance of heterogeneous activity throughout the axial and appendicular skeleton with corresponding diffuse sclerotic bone metastases on CT images. Index hypermetabolic lesion involving the left first rib is equal to 2.53. Previously 3.7. IMPRESSION: 1. Mixed interval response to therapy. 2. New hypermetabolic adenopathy within the left axilla. There has also been increase in FDG uptake associated with mediastinal nodes. 3. Similar FDG uptake associated with left adrenal gland metastasis. There has been increase in hypermetabolism associated with the right adrenal gland. 4. New hypermetabolism associated with bilateral adnexal regions. Cannot rule out  metastatic disease to the ovaries. 5. Increase in abdominal and pelvic ascites Electronically Signed   By: Kerby Moors M.D.   On: 08/09/2016 09:41    Assessment/Plan  Oral candidiasis Whitish patches noted on tongue.she is currently on Arimidex treatment for met breast cancer and Trimpex prophylaxis for UTI which could be contributing to candidiasis. Start on Nystatin 100,000 units/ml swish and spit 5 mls by mouth four times daily x 14 days.discussed with patient to avoid drinking or eating 30 minutes after swishing nystatin. She verbalized understanding.      Family/ staff Communication: Reviewed plan of care with patient and facility Nurse supervisor  Labs/tests ordered: None   Sandrea Hughs, NP

## 2016-08-23 ENCOUNTER — Ambulatory Visit (HOSPITAL_COMMUNITY): Payer: Medicare Other

## 2016-08-23 ENCOUNTER — Ambulatory Visit (HOSPITAL_COMMUNITY)
Admission: RE | Admit: 2016-08-23 | Discharge: 2016-08-23 | Disposition: A | Discharge status: Home / Self Care | Payer: Medicare Other | Source: Ambulatory Visit | Attending: Oncology | Admitting: Oncology

## 2016-08-23 ENCOUNTER — Ambulatory Visit (HOSPITAL_COMMUNITY)
Admission: RE | Admit: 2016-08-23 | Discharge: 2016-08-23 | Disposition: A | Discharge status: Home / Self Care | Payer: Medicare Other | Source: Ambulatory Visit | Attending: Radiology | Admitting: Radiology

## 2016-08-23 DIAGNOSIS — J90 Pleural effusion, not elsewhere classified: Secondary | ICD-10-CM | POA: Insufficient documentation

## 2016-08-23 DIAGNOSIS — C7972 Secondary malignant neoplasm of left adrenal gland: Secondary | ICD-10-CM

## 2016-08-23 DIAGNOSIS — Z853 Personal history of malignant neoplasm of breast: Secondary | ICD-10-CM | POA: Insufficient documentation

## 2016-08-23 DIAGNOSIS — C7951 Secondary malignant neoplasm of bone: Secondary | ICD-10-CM

## 2016-08-23 DIAGNOSIS — S2242XA Multiple fractures of ribs, left side, initial encounter for closed fracture: Secondary | ICD-10-CM | POA: Diagnosis not present

## 2016-08-23 DIAGNOSIS — C50919 Malignant neoplasm of unspecified site of unspecified female breast: Secondary | ICD-10-CM

## 2016-08-23 HISTORY — PX: IR THORACENTESIS ASP PLEURAL SPACE W/IMG GUIDE: IMG5380

## 2016-08-23 LAB — PROTEIN, PLEURAL OR PERITONEAL FLUID: TOTAL PROTEIN, FLUID: 3.4 g/dL

## 2016-08-23 LAB — GLUCOSE, PLEURAL OR PERITONEAL FLUID: Glucose, Fluid: 120 mg/dL

## 2016-08-23 LAB — GRAM STAIN

## 2016-08-23 LAB — LACTATE DEHYDROGENASE, PLEURAL OR PERITONEAL FLUID: LD, Fluid: 110 U/L — ABNORMAL HIGH (ref 3–23)

## 2016-08-23 MED ORDER — LIDOCAINE HCL (PF) 1 % IJ SOLN
INTRAMUSCULAR | Status: AC
  Filled 2016-08-23: qty 30

## 2016-08-23 MED ORDER — LIDOCAINE HCL (PF) 1 % IJ SOLN
INTRAMUSCULAR | Status: DC | PRN
  Administered 2016-08-23: 5 mL

## 2016-08-23 NOTE — Procedures (Signed)
PROCEDURE SUMMARY:  Successful US guided right thoracentesis. Yielded 1 L of clear, dark yellow fluid. Pt tolerated procedure well. No immediate complications.  Specimen was sent for labs. CXR ordered.  Ascencion Dike PA-C 08/23/2016 2:54 PM

## 2016-08-24 ENCOUNTER — Encounter (HOSPITAL_COMMUNITY): Payer: Self-pay | Admitting: Diagnostic Radiology

## 2016-08-24 ENCOUNTER — Other Ambulatory Visit: Payer: Self-pay | Admitting: Oncology

## 2016-08-25 ENCOUNTER — Non-Acute Institutional Stay: Payer: Medicare Other

## 2016-08-25 DIAGNOSIS — Z0000 Encounter for general adult medical examination without abnormal findings: Secondary | ICD-10-CM | POA: Diagnosis not present

## 2016-08-25 NOTE — Progress Notes (Signed)
Subjective:   Cassandra Glenn is a 83 y.o. female who presents for an Initial Medicare Annual Wellness Visit at [facility]       Objective:    Today's Vitals   08/25/16 1303  BP: (!) 142/60  Pulse: 98  Temp: 98 F (36.7 C)  TempSrc: Oral  SpO2: 95%  Weight: 127 lb (57.6 kg)  Height: 5\' 1"  (1.549 m)   Body mass index is 24 kg/m.   Current Medications (verified) Outpatient Encounter Prescriptions as of 08/25/2016  Medication Sig  . acetaminophen (TYLENOL) 500 MG tablet Take 500 mg by mouth every 6 (six) hours as needed for moderate pain.   Marland Kitchen ALPRAZolam (XANAX) 0.5 MG tablet Take 0.5 mg by mouth daily. In addition to scheduled daily dose pt may take 2 x day prn  . Amino Acids-Protein Hydrolys (FEEDING SUPPLEMENT, PRO-STAT SUGAR FREE 64,) LIQD Take 30 mLs by mouth daily. Stop date 08/30/16  . anastrozole (ARIMIDEX) 1 MG tablet Take 1 tablet (1 mg total) by mouth daily.  Marland Kitchen antiseptic oral rinse (BIOTENE) LIQD 15 mLs by Mouth Rinse route 2 (two) times daily as needed for dry mouth.   Marland Kitchen aspirin 81 MG chewable tablet Chew 81 mg by mouth daily.   . Bilberry 150 MG CAPS Take 150 mg by mouth daily.  . Calcium Carbonate-Vitamin D (CALCIUM 600+D) 600-400 MG-UNIT tablet Take 1 tablet by mouth 2 (two) times daily.  . Cholecalciferol 1000 units tablet Take 1,000 Units by mouth daily.  . Cranberry-Cholecalciferol (SUPER CRANBERRY/VITAMIN D3) 4200-500 MG-UNIT CAPS Take 1 capsule by mouth daily.  . Diphenhyd-Benzeth-Menth-Zn Ace (CALAGEL MAXIMUM STRENGTH EX) Apply 1 application topically as directed. 2 x day  . furosemide (LASIX) 40 MG tablet Take 1 tablet (40 mg total) by mouth daily. Hold if SBP < 110  . Glucosamine-Chondroitin (GLUCOSAMINE CHONDR COMPLEX PO) Take 2 tablets by mouth daily. for joints  . guaiFENesin (MUCINEX) 600 MG 12 hr tablet Take 600 mg by mouth 2 (two) times daily as needed for cough.  . Lactobacillus Rhamnosus, GG, (CULTURELLE PO) Take 1 tablet by  mouth daily.  Marland Kitchen loperamide (IMODIUM A-D) 2 MG tablet Take 4 mg by mouth as needed for diarrhea or loose stools. Give 4 mg by mouth as the initial dose, then 2 mg by mouth after each loose stool.  . magnesium hydroxide (MILK OF MAGNESIA) 400 MG/5ML suspension Take 30 mLs by mouth as needed for mild constipation.  . meclizine (ANTIVERT) 25 MG tablet Take 1 tablet (25 mg total) by mouth 3 (three) times daily as needed for dizziness.  . Meth-Hyo-M Bl-Na Phos-Ph Sal (URIBEL) 118 MG CAPS Take 1 capsule by mouth 3 (three) times daily as needed.  . mirtazapine (REMERON) 7.5 MG tablet Take 7.5 mg by mouth at bedtime.  . Multiple Vitamins-Minerals (PRESERVISION/LUTEIN) CAPS Take 1 capsule by mouth 2 (two) times daily.  . NON FORMULARY 1 bottle of gatorade as needed at residents request  . Omega-3 Fatty Acids (FISH OIL) 1000 MG CAPS Take 1 capsule by mouth daily.  Marland Kitchen omeprazole (PRILOSEC) 40 MG capsule Take 40 mg by mouth daily.  . ondansetron (ZOFRAN-ODT) 4 MG disintegrating tablet Take 4 mg by mouth every 8 (eight) hours as needed for nausea or vomiting.  . polycarbophil (FIBERCON) 625 MG tablet Take 625 mg by mouth 2 (two) times daily.  . polyethylene glycol (MIRALAX / GLYCOLAX) packet Take 17 g by mouth at bedtime. Pt also uses once daily as needed for  constipation.  . traMADol (ULTRAM) 50 MG tablet Take 50 mg by mouth 4 (four) times daily -  with meals and at bedtime.   Marland Kitchen trimethoprim (TRIMPEX) 100 MG tablet Take 100 mg by mouth at bedtime.  . [DISCONTINUED] nystatin (MYCOSTATIN/NYSTOP) powder Apply topically daily. 1 application to bilateral breast (under)   No facility-administered encounter medications on file as of 08/25/2016.     Allergies (verified) Macrodantin [nitrofurantoin] and Nystatin   History: Past Medical History:  Diagnosis Date  . Abnormal PET scan of mediastinum 11/11/2015  . Abnormalities of the hair 02/29/2012  . Arthritis   . BPV (benign positional vertigo)   . Cancer ([place])    . Cancer antigen 125 (CA 125) elevation 11/27/2013  . Candidiasis of skin and nails 03/19/2011  . 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 Chorioretinal scar, macular 01/23/2015  . Closed fracture of base of neck of femur ([place]) 03/18/2011  . Compression fracture of thoracic vertebra ([facility]) 11/24/2012  . Contact dermatitis and other eczema, due to unspecified cause 05/07/2011  . Depression 07/23/2014  . Diverticulosis of colon (without mention of hemorrhage) 03/19/2011  . Edema 05/07/2011  . HOH (hard of hearing)   . Hypertension 02/24/2016  . Hypopotassemia 03/26/2011  . Hyposmolality and/or hyponatremia 03/19/2011  . Hypotension, unspecified 03/18/2011  . IBS (irritable bowel syndrome) 11/27/2013  . Idiopathic scoliosis 01/27/2016  . Impaired fasting glucose 03/19/2011  . Insomnia, unspecified 03/19/2011  . Kidney infection   . Lumbago 03/19/2011  . Macular degeneration   . Malignant neoplasm of breast (female), unspecified site 10/28/2002  . Mass of right lung 10/23/2015  . Metastasis to adrenal gland ([place]) 11/21/2015   Breast cancer  . Metastatic breast cancer ([place]) 10/27/2004  . Nonexudative age-related macular degeneration 12/10/2010  . Osteoporosis 08/06/2014  . Osteoporosis, unspecified 03/19/2011  . Other malaise and fatigue 03/19/2011  . Pain in joint, pelvic region and thigh 03/19/2011  . Primary osteoarthritis of right hip 05/28/2014   TOTAL HIP ARTHROPLASTY ANTERIOR APPROACH HARDWARE REMOVAL on 05/28/2014   . Purpura senilis ([place]) 07/31/2013  . Radiculopathy, cervical 03/23/2016   Right C6  . Rib fractures   . Right bundle branch block 08/30/2011  . Shortness of breath   . Spasm of muscle 03/19/2011  . Spinal stenosis, lumbar region, with neurogenic claudication 11/02/2011  . TMJ click 1/51/7616  . Trigger finger (acquired) 11/02/2011  . Unspecified constipation 03/19/2011  . Urinary frequency 04/24/2013  . UTI (urinary tract infection) 04/16/2013   04/16/13 pseudomonas  aeruginosa: tx with Cipro 06/15/14 P. Aeruginosa [name] 500mg  IM q12h x 7 days.     . Vaginitis and vulvovaginitis 02/29/2012  . Vertigo 08/26/2015   Past Surgical History:  Procedure Laterality Date  . BREAST SURGERY Bilateral 2005-10/27/2004   lumpectomy- Streck,MD  . CATARACT EXTRACTION  2010   bialteral  . EYE SURGERY     cataract  . FRACTURE SURGERY Left 09/1980   ankle  . HARDWARE REMOVAL Right 05/28/2014   Procedure: HARDWARE REMOVAL;  Surgeon: Melrose Nakayama, MD;  Location: [facility];  Service: Orthopedics;  Laterality: Right;  . HIP PINNING,CANNULATED  03/08/2011   Procedure: CANNULATED HIP PINNING;  Surgeon: Johnn Hai, MD;  Location: [facility];  Service: Orthopedics;  Laterality: Right;  . IR THORACENTESIS ASP PLEURAL SPACE W/IMG GUIDE  08/23/2016  . ORIF HIP FRACTURE Right 03/08/2011   Bean,MD  . SKIN CANCER EXCISION Left 10/12/2012   lower leg Dr. [place]  . SKIN LESION EXCISION Right 02/04/2011   Abdomen  lesion spongiotic dermatitis-Taffeen, MD  . SQUAMOUS CELL CARCINOMA EXCISION Right 02/04/2011   forearmSyble Creek, MD  . TONSILLECTOMY    . TOTAL HIP ARTHROPLASTY Right 05/28/2014   Procedure: TOTAL HIP ARTHROPLASTY ANTERIOR APPROACH;  Surgeon: Melrose Nakayama, MD;  Location: [facility];  Service: Orthopedics;  Laterality: Right;   Family History  Problem Relation Age of Onset  . Heart disease Mother   . Cancer Father   . Emphysema Brother   . Alzheimer's disease Brother    Social History   Occupational History  . homemaker    Social History Main Topics  . Smoking status: Never Smoker  . Smokeless tobacco: Never Used  . Alcohol use 0.0 oz/week     Comment: occasional glass  . Drug use: No  . Sexual activity: No    Tobacco Counseling Counseling given: Not Answered   Activities of Daily Living In your present state of health, do you have any difficulty performing the following activities: 08/25/2016 12/03/2015  Hearing? Y N  Vision? N N  Difficulty concentrating  or making decisions? Y N  Walking or climbing stairs? Y Y  Dressing or bathing? Y Y  Doing errands, shopping? Y -  Conservation officer, nature and eating ? Y -  Using the Toilet? N -  In the past six months, have you accidently leaked urine? Y -  Comment self cath, stress incontinence -  Do you have problems with loss of bowel control? N -  Managing your Medications? Y -  Managing your Finances? Y -  Housekeeping or managing your Housekeeping? Y -  Some recent data might be hidden    Immunizations and Health Maintenance Immunization History  Administered Date(s) Administered  . Influenza Whole 10/12/2011, 10/12/2012  . Influenza-Unspecified 10/23/2013, 10/10/2014, 12/12/2015  . PPD Test 07/07/2010, 03/24/2016  . Pneumococcal Conjugate-13 01/12/2003  . Zoster 01/12/2007   Health Maintenance Due  Topic Date Due  . TETANUS/TDAP  10/19/1939  . PNA vac Low Risk Adult (2 of 2 - PPSV23) 01/12/2004  . MAMMOGRAM  08/11/2016  . INFLUENZA VACCINE  08/11/2016    Patient Care Team: Blanchie Serve, MD as PCP - General (Internal Medicine) Melina Modena, [facility] Shon Hough, MD as Consulting Physician (Ophthalmology) Molli Posey, MD as Consulting Physician (Obstetrics and Gynecology) Gerarda Fraction, MD as Consulting Physician (Ophthalmology) Lorelle Gibbs, MD as Consulting Physician (Radiology) Melrose Nakayama, MD as Consulting Physician (Orthopedic Surgery) Gerarda Fraction, MD as Referring Physician (Ophthalmology) Ardis Hughs, MD as Attending Physician (Urology) Magrinat, Virgie Dad, MD as Consulting Physician (Oncology) Ngetich, Nelda Bucks, NP as Nurse Practitioner (Family Medicine)  Indicate any recent Medical Services you may have received from other than [facility] providers in the past year (date may be approximate).     Assessment:   This is a routine wellness examination for [facility].   Hearing/Vision screen No exam data present  Dietary issues and exercise activities  discussed: Current Exercise Habits: The patient does not participate in regular exercise at present, Exercise limited by: None identified  Goals    None     Depression Screen PHQ 2/9 Scores 08/25/2016 11/11/2015 10/16/2015 07/03/2015 01/01/2015 11/15/2014 08/23/2014  PHQ - 2 Score 0 0 0 0 0 0 0    Fall Risk Fall Risk  08/25/2016 11/11/2015 10/16/2015 07/03/2015 01/28/2015  Falls in the past year? No Yes No No No  Number falls in past yr: - 1 - - -  Injury with Fall? - Yes - - -  Risk Factor Category  -  High Fall Risk - - -  Risk for fall due to : - History of fall(s);Impaired balance/gait;Impaired mobility - - -  Follow up - Falls evaluation completed - - -    Cognitive Function: MMSE - Mini Mental State Exam 08/25/2016  Orientation to time 2  Orientation to Place 5  Registration 3  Attention/ Calculation 5  Recall 0  Language- name 2 objects 2  Language- repeat 1  Language- follow 3 step command 3  Language- read & follow direction 1  Write a sentence 1  Copy design 0  Total score 23        Screening Tests Health Maintenance  Topic Date Due  . TETANUS/TDAP  10[birth year]19/1939  . PNA vac Low Risk Adult (2 of 2 - PPSV23) 01/12/2004  . MAMMOGRAM  08/11/2016  . INFLUENZA VACCINE  08/11/2016  . DEXA SCAN  Completed      Plan:    I have personally reviewed and addressed the Medicare Annual Wellness questionnaire and have noted the following in the patient's chart:  A. Medical and social history B. Use of alcohol, tobacco or illicit drugs  C. Current medications and supplements D. Functional ability and status E.  Nutritional status F.  Physical activity G. Advance directives H. List of other physicians I.  Hospitalizations, surgeries, and ER visits in previous 12 months J.  [place] to include hearing, vision, cognitive, depression L. Referrals and appointments - none  In addition, I have reviewed and discussed with patient certain preventive protocols, quality  metrics, and best practice recommendations. A written personalized care plan for preventive services as well as general preventive health recommendations were provided to patient.  See attached scanned questionnaire for additional information.   Signed,   Rich Reining, RN Nurse Health Advisor   Quick Notes   Health Maintenance: PNA 23 and TDAP due     Abnormal Screen: MMSE 23/30. Did not pass clock drawing     Patient Concerns: None     Nurse Concerns: None

## 2016-08-27 NOTE — Progress Notes (Signed)
Location:  [facility] Room Number: 8 Place of Service:  ALF [phone]) Provider: Talibah Colasurdo FNP-C   Blanchie Serve, MD  Patient Care Team: Blanchie Serve, MD as PCP - General (Internal Medicine) Melina Modena, [facility] Shon Hough, MD as Consulting Physician (Ophthalmology) Molli Posey, MD as Consulting Physician (Obstetrics and Gynecology) Gerarda Fraction, MD as Consulting Physician (Ophthalmology) Lorelle Gibbs, MD as Consulting Physician (Radiology) Melrose Nakayama, MD as Consulting Physician (Orthopedic Surgery) Gerarda Fraction, MD as Referring Physician (Ophthalmology) Ardis Hughs, MD as Attending Physician (Urology) Magrinat, Virgie Dad, MD as Consulting Physician (Oncology) Alphonsine Minium, Nelda Bucks, NP as Nurse Practitioner (Family Medicine)  Extended Emergency Contact Information Primary Emergency Contact: Black,Emily Address: [address]          [place], [place] [address] [name] of [place] Phone: [phone] Mobile Phone: [phone] Relation: Daughter Secondary Emergency Contact: Marcine Matar [place] of [place] Mobile Phone: [phone] Relation: Daughter  Code Status:  DNR Goals of care: Advanced Directive information Advanced Directives 08/25/2016  Does Patient Have a Medical Advance Directive? Yes  Type of Paramedic of [place];Living will;Out of facility DNR (pink MOST or yellow form)  Does patient want to make changes to medical advance directive? No - Patient declined  Copy of [place] in Chart? Yes  Would patient like information on creating a medical advance directive? -  Pre-existing out of facility DNR order (yellow form or pink MOST form) Yellow form placed in chart (order not valid for inpatient use);Pink MOST form placed in chart (order not valid for inpatient use)     Chief Complaint  Patient presents with  . Medical Management of Chronic Issues   routine visit    HPI:  Pt is a 83 y.o. female seen today [facility] for medical management of chronic diseases.she has a medical history of HTN, GERD, Breast cancer with mets to lungs/bones, IBS, urine retention (does self catheterization twice daily) among other conditions. She is seen in her room today. She states has no appetite for food. In the interval since prior visit, she was treated for pneumonia with much improvement.Her weight has been stable at 127.2 pounds. Facility Nurse reports no new concerns.       Past Medical History:  Diagnosis Date  . Abnormal PET scan of mediastinum 11/11/2015  . Abnormalities of the hair 02/29/2012  . Arthritis   . BPV (benign positional vertigo)   . Cancer ([place])   . Cancer antigen 125 (CA 125) elevation 11/27/2013  . Candidiasis of skin and nails 03/19/2011  . 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 Chorioretinal scar, macular 01/23/2015  . Closed fracture of base of neck of femur ([place]) 03/18/2011  . Compression fracture of thoracic vertebra ([facility]) 11/24/2012  . Contact dermatitis and other eczema, due to unspecified cause 05/07/2011  . Depression 07/23/2014  . Diverticulosis of colon (without mention of hemorrhage) 03/19/2011  . Edema 05/07/2011  . HOH (hard of hearing)   . Hypertension 02/24/2016  . Hypopotassemia 03/26/2011  . Hyposmolality and/or hyponatremia 03/19/2011  . Hypotension, unspecified 03/18/2011  . IBS (irritable bowel syndrome) 11/27/2013  . Idiopathic scoliosis 01/27/2016  . Impaired fasting glucose 03/19/2011  . Insomnia, unspecified 03/19/2011  . Kidney infection   . Lumbago 03/19/2011  . Macular degeneration   . Malignant neoplasm of breast (female), unspecified site 10/28/2002  . Mass of right lung 10/23/2015  . Metastasis to adrenal gland ([place]) 11/21/2015   Breast cancer  .  Metastatic breast cancer ([place]) 10/27/2004  . Nonexudative age-related macular degeneration 12/10/2010  . Osteoporosis 08/06/2014  .  Osteoporosis, unspecified 03/19/2011  . Other malaise and fatigue 03/19/2011  . Pain in joint, pelvic region and thigh 03/19/2011  . Primary osteoarthritis of right hip 05/28/2014   TOTAL HIP ARTHROPLASTY ANTERIOR APPROACH HARDWARE REMOVAL on 05/28/2014   . Purpura senilis ([facility]) 07/31/2013  . Radiculopathy, cervical 03/23/2016   Right C6  . Rib fractures   . Right bundle branch block 08/30/2011  . Shortness of breath   . Spasm of muscle 03/19/2011  . Spinal stenosis, lumbar region, with neurogenic claudication 11/02/2011  . TMJ click 7/35/3299  . Trigger finger (acquired) 11/02/2011  . Unspecified constipation 03/19/2011  . Urinary frequency 04/24/2013  . UTI (urinary tract infection) 04/16/2013   04/16/13 pseudomonas aeruginosa: tx with Cipro 06/15/14 P. Aeruginosa [name] 500mg  IM q12h x 7 days.     . Vaginitis and vulvovaginitis 02/29/2012  . Vertigo 08/26/2015   Past Surgical History:  Procedure Laterality Date  . BREAST SURGERY Bilateral 2005-10/27/2004   lumpectomy- Streck,MD  . CATARACT EXTRACTION  2010   bialteral  . EYE SURGERY     cataract  . FRACTURE SURGERY Left 09/1980   ankle  . HARDWARE REMOVAL Right 05/28/2014   Procedure: HARDWARE REMOVAL;  Surgeon: Melrose Nakayama, MD;  Location: [facility];  Service: Orthopedics;  Laterality: Right;  . HIP PINNING,CANNULATED  03/08/2011   Procedure: CANNULATED HIP PINNING;  Surgeon: Johnn Hai, MD;  Location: [facility];  Service: Orthopedics;  Laterality: Right;  . IR THORACENTESIS ASP PLEURAL SPACE W/IMG GUIDE  08/23/2016  . ORIF HIP FRACTURE Right 03/08/2011   Bean,MD  . SKIN CANCER EXCISION Left 10/12/2012   lower leg Dr. [place]  . SKIN LESION EXCISION Right 02/04/2011   Abdomen lesion spongiotic dermatitis-Taffeen, MD  . SQUAMOUS CELL CARCINOMA EXCISION Right 02/04/2011   forearmSyble Creek, MD  . TONSILLECTOMY    . TOTAL HIP ARTHROPLASTY Right 05/28/2014   Procedure: TOTAL HIP ARTHROPLASTY ANTERIOR APPROACH;  Surgeon: Melrose Nakayama,  MD;  Location: [facility];  Service: Orthopedics;  Laterality: Right;    Allergies  Allergen Reactions  . Macrodantin [Nitrofurantoin] Other (See Comments)    Reaction:  Unknown   . Nystatin Swelling    Allergies as of 08/11/2016      Reactions   Macrodantin [nitrofurantoin] Other (See Comments)   Reaction:  Unknown       Medication List       Accurate as of 08/11/16 11:59 PM. Always use your most recent med list.          acetaminophen 500 MG tablet Commonly known as:  TYLENOL Take 500 mg by mouth every 6 (six) hours as needed for moderate pain.   ALPRAZolam 0.5 MG tablet Commonly known as:  XANAX Take 0.5 mg by mouth daily. In addition to scheduled daily dose pt may take 2 x day prn   anastrozole 1 MG tablet Commonly known as:  ARIMIDEX Take 1 tablet (1 mg total) by mouth daily.   antiseptic oral rinse Liqd 15 mLs by Mouth Rinse route 2 (two) times daily as needed for dry mouth.   aspirin 81 MG chewable tablet Chew 81 mg by mouth daily.   Bilberry 150 MG Caps Take 150 mg by mouth daily.   CALAGEL MAXIMUM STRENGTH EX Apply 1 application topically as directed. 2 x day   CALCIUM 600+D 600-400 MG-UNIT tablet Generic drug:  Calcium Carbonate-Vitamin D Take 1 tablet  by mouth 2 (two) times daily.   Cholecalciferol 1000 units tablet Take 1,000 Units by mouth daily.   Fish Oil 1000 MG Caps Take 1 capsule by mouth daily.   furosemide 40 MG tablet Commonly known as:  LASIX Take 1 tablet (40 mg total) by mouth daily. Hold if SBP < 110   GLUCOSAMINE CHONDR COMPLEX PO Take 2 tablets by mouth daily. for joints   guaiFENesin 600 MG 12 hr tablet Commonly known as:  MUCINEX Take 600 mg by mouth 2 (two) times daily as needed for cough.   ibuprofen 400 MG tablet Commonly known as:  ADVIL,MOTRIN Take 400 mg by mouth as needed.   loperamide 2 MG tablet Commonly known as:  IMODIUM A-D Take 4 mg by mouth as needed for diarrhea or loose stools. Give 4 mg by mouth as the  initial dose, then 2 mg by mouth after each loose stool.   meclizine 25 MG tablet Commonly known as:  ANTIVERT Take 1 tablet (25 mg total) by mouth 3 (three) times daily as needed for dizziness.   mirtazapine 7.5 MG tablet Commonly known as:  REMERON Take 7.5 mg by mouth at bedtime.   NON FORMULARY 1 bottle of gatorade as needed at residents request   nystatin powder Commonly known as:  MYCOSTATIN/NYSTOP Apply topically daily. 1 application to bilateral breast (under)   omeprazole 20 MG capsule Commonly known as:  PRILOSEC Take 20 mg by mouth 2 (two) times daily before a meal.   ondansetron 4 MG disintegrating tablet Commonly known as:  ZOFRAN-ODT Take 4 mg by mouth every 8 (eight) hours as needed for nausea or vomiting.   polycarbophil 625 MG tablet Commonly known as:  FIBERCON Take 625 mg by mouth 2 (two) times daily.   polyethylene glycol packet Commonly known as:  MIRALAX / GLYCOLAX Take 17 g by mouth at bedtime. Pt also uses once daily as needed for constipation.   PRESERVISION/LUTEIN Caps Take 1 capsule by mouth 2 (two) times daily.   SUPER CRANBERRY/VITAMIN D3 4200-500 MG-UNIT Caps Generic drug:  Cranberry-Cholecalciferol Take 1 capsule by mouth daily.   traMADol 50 MG tablet Commonly known as:  ULTRAM Take 50 mg by mouth 4 (four) times daily -  with meals and at bedtime.   trimethoprim 100 MG tablet Commonly known as:  TRIMPEX Take 100 mg by mouth at bedtime.   URIBEL 118 MG Caps Take 1 capsule by mouth 3 (three) times daily as needed.       Review of Systems  Constitutional: Positive for appetite change. Negative for activity change, chills, diaphoresis, fever and unexpected weight change.  HENT: Positive for hearing loss. Negative for congestion, rhinorrhea, sinus pain, sinus pressure, sneezing and sore throat.   Eyes: Negative for pain, discharge, redness, itching and visual disturbance.  Respiratory: Negative for cough, chest tightness, shortness  of breath and wheezing.   Cardiovascular: Positive for leg swelling. Negative for chest pain and palpitations.  Gastrointestinal: Negative for abdominal distention, abdominal pain, constipation, diarrhea, nausea and vomiting.  Endocrine: Negative for cold intolerance, heat intolerance, polydipsia, polyphagia and polyuria.  Genitourinary: Negative for frequency and hematuria.        Urine rentention does own in and out cath twice daily  Musculoskeletal: Positive for gait problem.  Skin: Negative for color change, pallor and rash.  Neurological: Negative for dizziness, seizures, syncope, light-headedness and headaches.  Hematological: Does not bruise/bleed easily.  Psychiatric/Behavioral: Negative for agitation, confusion, hallucinations and sleep disturbance. The patient is not  nervous/anxious.     Immunization History  Administered Date(s) Administered  . Influenza Whole 10/12/2011, 10/12/2012  . Influenza-Unspecified 10/23/2013, 10/10/2014, 12/12/2015  . PPD Test 07/07/2010, 03/24/2016  . Pneumococcal Conjugate-13 01/12/2003  . Zoster 01/12/2007   Pertinent  Health Maintenance Due  Topic Date Due  . PNA vac Low Risk Adult (2 of 2 - PPSV23) 01/12/2004  . MAMMOGRAM  08/11/2016  . INFLUENZA VACCINE  08/11/2016  . DEXA SCAN  Completed   Fall Risk  08/25/2016 11/11/2015 10/16/2015 07/03/2015 01/28/2015  Falls in the past year? No Yes No No No  Number falls in past yr: - 1 - - -  Injury with Fall? - Yes - - -  Risk Factor Category  - High Fall Risk - - -  Risk for fall due to : - History of fall(s);Impaired balance/gait;Impaired mobility - - -  Follow up - Falls evaluation completed - - -    Vitals:   08/11/16 1121  BP: (!) 110/58  Pulse: 81  Resp: 16  Temp: 98 F (36.7 C)  SpO2: 94%  Weight: 129 lb 8 oz (58.7 kg)  Height: 5\' 1"  (1.549 m)   Body mass index is 24.47 kg/m. Physical Exam  Constitutional: She is oriented to person, place, and time. She appears well-developed and  well-nourished. No distress.  HENT:  Head: Normocephalic.  Right Ear: External ear normal.  Left Ear: External ear normal.  Mouth/Throat: No oropharyngeal exudate.  Chronic dry mouth   Eyes: Pupils are equal, round, and reactive to light. Conjunctivae and EOM are normal. Right eye exhibits no discharge. Left eye exhibits no discharge. No scleral icterus.  Neck: Normal range of motion. No JVD present. No thyromegaly present.  Cardiovascular: Normal rate, regular rhythm, normal heart sounds and intact distal pulses.  Exam reveals no gallop and no friction rub.   No murmur heard. Pulmonary/Chest: Effort normal and breath sounds normal. No respiratory distress. She has no wheezes. She has no rales.  Abdominal: Soft. Bowel sounds are normal. She exhibits no distension. There is no tenderness. There is no rebound and no guarding.   Suprapubic tenderness to palpation   Musculoskeletal: Normal range of motion. She exhibits no edema or tenderness.  Unsteady gait uses FWW to ambulate. Bilateral lower extremities 2+ edema.   Lymphadenopathy:    She has no cervical adenopathy.  Neurological: She is oriented to person, place, and time. Coordination normal.  Skin: Skin is warm and dry. No rash noted. No erythema. No pallor.  Psychiatric: She has a normal mood and affect.    Labs reviewed:  Recent Labs  01/19/16 0800  02/18/16 1204  05/20/16 1658  06/21/16 1413 06/24/16 07/19/16 1432 08/16/16 1441  NA 134*  < > 129*  < > 134*  < > 134* 134* 132* 131*  K 4.5  < > 4.2  < > 4.1  < > 4.4 4.5 4.2 4.0  CL 98  --  98*  --  100*  --   --   --   --   --   CO2 28  < > 23  < > 28  < > 27  --  28 28  GLUCOSE 107*  < > 108*  < > 111*  < > 117  --  128 147*  BUN 15  < > 13  < > 15  < > 11.3 9 14.1 19.1  CREATININE 0.81  < > 0.71  < > 0.81  < > 0.9 0.7 1.0  1.0  CALCIUM 9.5  < > 8.9  < > 7.6*  < > 8.8  --  8.8 8.4  < > = values in this interval not displayed.  Recent Labs  06/21/16 1413 07/19/16 1432  08/16/16 1441  AST 47* 65* 79*  ALT 26 36 41  ALKPHOS 180* 257* 300*  BILITOT 0.33 0.41 0.57  PROT 5.9* 6.0* 5.5*  ALBUMIN 2.9* 3.0* 2.8*    Recent Labs  06/21/16 1413 07/19/16 1432 08/12/16 08/16/16 08/16/16 1441  WBC 7.1 6.7 8.1 9.0 8.0  NEUTROABS 3.3 3.0  --   --  4.5  HGB 11.8 11.2* 10.5* 11.5* 10.6*  HCT 36.3 35.6 32* 35* 32.8*  MCV 88.9 89.4  --   --  90.4  PLT 245 214 191 220 178   Lab Results  Component Value Date   TSH 3.215 08/25/2015   Lab Results  Component Value Date   HGBA1C 5.9 01/23/2015   Assessment/Plan 1. Essential hypertension B/p stable. Continue on Furosemide. Continue to monitor CMP.On ASA for chest pain prophylaxis. No statin due to her advance age.   2. Slow transit constipation Current regimen effective. Continue to encourage oral intake and hydration.   3. Gastroesophageal reflux disease without esophagitis Asymptomatic. Continue with Omeprazole. Monitor mg level.    4. Primary osteoarthritis of right hip Continue on Tramadol 50 mg Tablet four times daily.    5. Urinary retention Afebrile. Continue with self catheterization and TMX 100 mg tablet at bedtime. Monitor for signs/symptoms of UTI. Encourage fluid intake.   6. Metastatic breast cancer Mets to lungs and bones.seems to be coping well.Has upcoming appointment for CT scan for follow up.continue on anastrozole 1 mg tablet daily. Continue to follow up with Oncology as directed.    Family/ staff Communication: Reviewed plan of care with patient and facility Nurse supervisor   Labs/tests ordered: None   Sandrea Hughs, NP

## 2016-08-31 ENCOUNTER — Encounter: Payer: Medicare Other | Admitting: Internal Medicine

## 2016-09-01 ENCOUNTER — Encounter: Payer: Self-pay | Admitting: Internal Medicine

## 2016-09-01 ENCOUNTER — Non-Acute Institutional Stay: Payer: Medicare Other | Admitting: Internal Medicine

## 2016-09-01 VITALS — BP 130/64 | HR 83 | Temp 97.9°F | Resp 18 | Ht 61.0 in | Wt 130.4 lb

## 2016-09-01 DIAGNOSIS — R600 Localized edema: Secondary | ICD-10-CM | POA: Diagnosis not present

## 2016-09-01 DIAGNOSIS — E871 Hypo-osmolality and hyponatremia: Secondary | ICD-10-CM | POA: Diagnosis not present

## 2016-09-01 DIAGNOSIS — C50919 Malignant neoplasm of unspecified site of unspecified female breast: Secondary | ICD-10-CM | POA: Diagnosis not present

## 2016-09-01 DIAGNOSIS — J90 Pleural effusion, not elsewhere classified: Secondary | ICD-10-CM | POA: Diagnosis not present

## 2016-09-01 NOTE — Progress Notes (Signed)
[facility]  Provider: Blanchie Serve MD   [facility]  [facility]   Place of Service:  Clinic ([id number]) clinic  PCP: Blanchie Serve, MD Patient Care Team: Blanchie Serve, MD as PCP - General (Internal Medicine) Melina Modena, [facility] Shon Hough, MD as Consulting Physician (Ophthalmology) Molli Posey, MD as Consulting Physician (Obstetrics and Gynecology) Gerarda Fraction, MD as Consulting Physician (Ophthalmology) Lorelle Gibbs, MD as Consulting Physician (Radiology) Melrose Nakayama, MD as Consulting Physician (Orthopedic Surgery) Gerarda Fraction, MD as Referring Physician (Ophthalmology) Ardis Hughs, MD as Attending Physician (Urology) Magrinat, Virgie Dad, MD as Consulting Physician (Oncology) Ngetich, Nelda Bucks, NP as Nurse Practitioner [facility] Medicine)  Extended Emergency Contact Information Primary Emergency Contact: Black,Emily Address: [address]          [place], [place] [address] [name] of [place] Phone: [phone] Mobile Phone: [phone] Relation: Daughter Secondary Emergency Contact: Marcine Matar [place] of [place] Mobile Phone: [phone] Relation: Daughter   Goals of Care: Advanced Directive information Advanced Directives 08/25/2016  Does Patient Have a Medical Advance Directive? Yes  Type of Paramedic of [place];Living will;Out of facility DNR (pink MOST or yellow form)  Does patient want to make changes to medical advance directive? No - Patient declined  Copy of [place] in Chart? Yes  Would patient like information on creating a medical advance directive? -  Pre-existing out of facility DNR order (yellow form or pink MOST form) Yellow form placed in chart (order not valid for inpatient use);Pink MOST form placed in chart (order not valid for inpatient use)      Chief Complaint  Patient presents with  . Acute Visit    leg edema  .  Medication Refill    No refills needed at this time    HPI: Patient is a 83 y.o. female seen today for acute visit for increased leg edema. Pt here with her daughter. They have noticed increased swelling to her legs. Denies pain or redness. No fall reported. She walks every day. She is mostly sitting on her chair with her legs hanging. Of note, she underwent thoracocentesis with removal of 1 l pleural fluid on right side. She has history of breast cancer with metastases and has abdominal and pelvic ascites.     Past Medical History:  Diagnosis Date  . Abnormal PET scan of mediastinum 11/11/2015  . Abnormalities of the hair 02/29/2012  . Arthritis   . BPV (benign positional vertigo)   . Cancer ([place])   . Cancer antigen 125 (CA 125) elevation 11/27/2013  . Candidiasis of skin and nails 03/19/2011  . 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 Chorioretinal scar, macular 01/23/2015  . Closed fracture of base of neck of femur ([place]) 03/18/2011  . Compression fracture of thoracic vertebra ([facility]) 11/24/2012  . Contact dermatitis and other eczema, due to unspecified cause 05/07/2011  . Depression 07/23/2014  . Diverticulosis of colon (without mention of hemorrhage) 03/19/2011  . Edema 05/07/2011  . HOH (hard of hearing)   . Hypertension 02/24/2016  . Hypopotassemia 03/26/2011  . Hyposmolality and/or hyponatremia 03/19/2011  . Hypotension, unspecified 03/18/2011  . IBS (irritable bowel syndrome) 11/27/2013  . Idiopathic scoliosis 01/27/2016  . Impaired fasting glucose 03/19/2011  . Insomnia, unspecified 03/19/2011  . Kidney infection   . Lumbago 03/19/2011  . Macular degeneration   . Malignant neoplasm of breast (female), unspecified site 10/28/2002  . Mass of right lung 10/23/2015  . 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 Metastatic breast cancer ([place]) 10/27/2004  . Nonexudative age-related macular degeneration 12/10/2010  . Osteoporosis 08/06/2014  . Osteoporosis,  unspecified 03/19/2011  . Other malaise and fatigue 03/19/2011  . Pain in joint, pelvic region and thigh 03/19/2011  . Primary osteoarthritis of right hip 05/28/2014   TOTAL HIP ARTHROPLASTY ANTERIOR APPROACH HARDWARE REMOVAL on 05/28/2014   . Purpura senilis ([place]) 07/31/2013  . Radiculopathy, cervical 03/23/2016   Right C6  . Rib fractures   . Right bundle branch block 08/30/2011  . Shortness of breath   . Spasm of muscle 03/19/2011  . Spinal stenosis, lumbar region, with neurogenic claudication 11/02/2011  . TMJ click 08/29/2991  . Trigger finger (acquired) 11/02/2011  . Unspecified constipation 03/19/2011  . Urinary frequency 04/24/2013  . UTI (urinary tract infection) 04/16/2013   04/16/13 pseudomonas aeruginosa: tx with Cipro 06/15/14 P. Aeruginosa [name] 500mg  IM q12h x 7 days.     . Vaginitis and vulvovaginitis 02/29/2012  . Vertigo 08/26/2015   Past Surgical History:  Procedure Laterality Date  . BREAST SURGERY Bilateral 2005-10/27/2004   lumpectomy- Streck,MD  . CATARACT EXTRACTION  2010   bialteral  . EYE SURGERY     cataract  . FRACTURE SURGERY Left 09/1980   ankle  . HARDWARE REMOVAL Right 05/28/2014   Procedure: HARDWARE REMOVAL;  Surgeon: Melrose Nakayama, MD;  Location: [facility];  Service: Orthopedics;  Laterality: Right;  . HIP PINNING,CANNULATED  03/08/2011   Procedure: CANNULATED HIP PINNING;  Surgeon: Johnn Hai, MD;  Location: [facility];  Service: Orthopedics;  Laterality: Right;  . IR THORACENTESIS ASP PLEURAL SPACE W/IMG GUIDE  08/23/2016  . ORIF HIP FRACTURE Right 03/08/2011   Bean,MD  . SKIN CANCER EXCISION Left 10/12/2012   lower leg Dr. [place]  . SKIN LESION EXCISION Right 02/04/2011   Abdomen lesion spongiotic dermatitis-Taffeen, MD  . SQUAMOUS CELL CARCINOMA EXCISION Right 02/04/2011   forearmSyble Creek, MD  . TONSILLECTOMY    . TOTAL HIP ARTHROPLASTY Right 05/28/2014   Procedure: TOTAL HIP ARTHROPLASTY ANTERIOR APPROACH;  Surgeon: Melrose Nakayama, MD;  Location:  [facility];  Service: Orthopedics;  Laterality: Right;    reports that she has never smoked. She has never used smokeless tobacco. She reports that she drinks alcohol. She reports that she does not use drugs. Social History   Social History  . Marital status: Widowed    Spouse name: N/A  . Number of children: N/A  . Years of education: N/A   Occupational History  . homemaker    Social History Main Topics  . Smoking status: Never Smoker  . Smokeless tobacco: Never Used  . Alcohol use 0.0 oz/week     Comment: occasional glass  . Drug use: No  . Sexual activity: No   Other Topics Concern  . Not on file   Social History Narrative   Lives at [facility]   Widowed   Never smoked   Alcohol occasionally   Walks with walker   Exercise none   POA, Living Will, DNR        Functional Status Survey:    Family History  Problem Relation Age of Onset  . Heart disease Mother   . Cancer Father   . Emphysema Brother   . Alzheimer's disease Brother     Health Maintenance  Topic Date Due  . TETANUS/TDAP  10/19/1939  . PNA vac Low Risk Adult (2 of 2 - PPSV23) 01/12/2004  .  MAMMOGRAM  08/11/2016  . INFLUENZA VACCINE  08/11/2016  . DEXA SCAN  Completed    Allergies  Allergen Reactions  . Macrodantin [Nitrofurantoin] Other (See Comments)    Reaction:  Unknown   . Nystatin Swelling    Outpatient Encounter Prescriptions as of 09/01/2016  Medication Sig  . acetaminophen (TYLENOL) 500 MG tablet Take 500 mg by mouth every 6 (six) hours as needed for moderate pain.   Marland Kitchen ALPRAZolam (XANAX) 0.5 MG tablet Take 0.5 mg by mouth daily. In addition to scheduled daily dose pt may take 2 x day prn  . Amino Acids-Protein Hydrolys (FEEDING SUPPLEMENT, PRO-STAT SUGAR FREE 64,) LIQD Take 30 mLs by mouth daily. Stop date 08/30/16  . anastrozole (ARIMIDEX) 1 MG tablet Take 1 tablet (1 mg total) by mouth daily.  Marland Kitchen antiseptic oral rinse (BIOTENE) LIQD 15 mLs by Mouth Rinse route 2 (two) times  daily as needed for dry mouth.   Marland Kitchen aspirin 81 MG chewable tablet Chew 81 mg by mouth daily.   . Bilberry 150 MG CAPS Take 150 mg by mouth daily.  . Calcium Carbonate-Vitamin D (CALCIUM 600+D) 600-400 MG-UNIT tablet Take 1 tablet by mouth 2 (two) times daily.  . Cholecalciferol 1000 units tablet Take 1,000 Units by mouth daily.  . Cranberry-Cholecalciferol (SUPER CRANBERRY/VITAMIN D3) 4200-500 MG-UNIT CAPS Take 1 capsule by mouth daily.  . Diphenhyd-Benzeth-Menth-Zn Ace (CALAGEL MAXIMUM STRENGTH EX) Apply 1 application topically as directed. 2 x day  . furosemide (LASIX) 40 MG tablet Take 1 tablet (40 mg total) by mouth daily. Hold if SBP < 110  . Glucosamine-Chondroitin (GLUCOSAMINE CHONDR COMPLEX PO) Take 2 tablets by mouth daily. for joints  . guaiFENesin (MUCINEX) 600 MG 12 hr tablet Take 600 mg by mouth 2 (two) times daily as needed for cough.  . Lactobacillus Rhamnosus, GG, (CULTURELLE PO) Take 1 tablet by mouth daily.  Marland Kitchen loperamide (IMODIUM A-D) 2 MG tablet Take 4 mg by mouth as needed for diarrhea or loose stools. Give 4 mg by mouth as the initial dose, then 2 mg by mouth after each loose stool.  . magnesium hydroxide (MILK OF MAGNESIA) 400 MG/5ML suspension Take 30 mLs by mouth as needed for mild constipation.  . meclizine (ANTIVERT) 25 MG tablet Take 1 tablet (25 mg total) by mouth 3 (three) times daily as needed for dizziness.  . Meth-Hyo-M Bl-Na Phos-Ph Sal (URIBEL) 118 MG CAPS Take 1 capsule by mouth 3 (three) times daily as needed.  . mirtazapine (REMERON) 7.5 MG tablet Take 7.5 mg by mouth at bedtime.  . Multiple Vitamins-Minerals (PRESERVISION/LUTEIN) CAPS Take 1 capsule by mouth 2 (two) times daily.  . NON FORMULARY 1 bottle of gatorade as needed at residents request  . Omega-3 Fatty Acids (FISH OIL) 1000 MG CAPS Take 1 capsule by mouth daily.  Marland Kitchen omeprazole (PRILOSEC) 40 MG capsule Take 40 mg by mouth daily.  . ondansetron (ZOFRAN-ODT) 4 MG disintegrating tablet Take 4 mg by mouth  every 8 (eight) hours as needed for nausea or vomiting.  . polycarbophil (FIBERCON) 625 MG tablet Take 625 mg by mouth 2 (two) times daily.  . polyethylene glycol (MIRALAX / GLYCOLAX) packet Take 17 g by mouth at bedtime. Pt also uses once daily as needed for constipation.  . traMADol (ULTRAM) 50 MG tablet Take 50 mg by mouth 4 (four) times daily -  with meals and at bedtime.   Marland Kitchen trimethoprim (TRIMPEX) 100 MG tablet Take 100 mg by mouth at bedtime.   No facility-administered  encounter medications on file as of 09/01/2016.     Review of Systems  HENT: Negative for trouble swallowing.   Respiratory: Positive for cough. Negative for shortness of breath and wheezing.        Uses oxygen at night as needed, has been requiring oxygen for last 2 nights. Denies dyspnea at present. No shortness of breath in between sentences. Has dry cough  Cardiovascular: Positive for leg swelling. Negative for chest pain and palpitations.       Has bilateral leg swelling- has increased  Gastrointestinal:       Miralax helps with her constipation, moves her bowel almost every day  Genitourinary: Negative for dysuria.       Self irrigates bladder twice a day with catheter  Musculoskeletal:       Denies leg pain or cramps, uses a collapsable walker, no fall reported  Neurological: Negative for weakness and numbness.  Psychiatric/Behavioral: Negative for behavioral problems and confusion.    Vitals:   09/01/16 1101  BP: 130/64  Pulse: 83  Resp: 18  Temp: 97.9 F (36.6 C)  TempSrc: Oral  SpO2: 94%  Weight: 130 lb 6.4 oz (59.1 kg)  Height: 5\' 1"  (1.549 m)   Body mass index is 24.64 kg/m. Physical Exam  Constitutional: She is oriented to person, place, and time. She appears well-developed and well-nourished.  HENT:  Head: Normocephalic and atraumatic.  Mouth/Throat: Oropharynx is clear and moist.  Eyes: Pupils are equal, round, and reactive to light. Conjunctivae are normal.  Neck: Neck supple.    Cardiovascular: Normal rate and regular rhythm.   Pulmonary/Chest: Effort normal and breath sounds normal. She has no wheezes.  Decreased air entry  Abdominal: Soft. Bowel sounds are normal. There is no tenderness.  Musculoskeletal: She exhibits edema.  Able to move all 4 extremities, unsteady gait, collapsable walker, 1+ pitting edema, skin intact  Lymphadenopathy:    She has no cervical adenopathy.  Neurological: She is alert and oriented to person, place, and time.  Skin: Skin is warm and dry.  Psychiatric: She has a normal mood and affect.     Wt Readings from Last 3 Encounters:  09/01/16 130 lb 6.4 oz (59.1 kg)  08/25/16 127 lb (57.6 kg)  08/20/16 127 lb 3.2 oz (57.7 kg)    Labs reviewed: Basic Metabolic Panel:  Recent Labs  01/19/16 0800  02/18/16 1204  05/20/16 1658  06/21/16 1413 06/24/16 07/19/16 1432 08/16/16 1441  NA 134*  < > 129*  < > 134*  < > 134* 134* 132* 131*  K 4.5  < > 4.2  < > 4.1  < > 4.4 4.5 4.2 4.0  CL 98  --  98*  --  100*  --   --   --   --   --   CO2 28  < > 23  < > 28  < > 27  --  28 28  GLUCOSE 107*  < > 108*  < > 111*  < > 117  --  128 147*  BUN 15  < > 13  < > 15  < > 11.3 9 14.1 19.1  CREATININE 0.81  < > 0.71  < > 0.81  < > 0.9 0.7 1.0 1.0  CALCIUM 9.5  < > 8.9  < > 7.6*  < > 8.8  --  8.8 8.4  < > = values in this interval not displayed. Liver Function Tests:  Recent Labs  06/21/16 1413 07/19/16 1432 08/16/16  1441  AST 47* 65* 79*  ALT 26 36 41  ALKPHOS 180* 257* 300*  BILITOT 0.33 0.41 0.57  PROT 5.9* 6.0* 5.5*  ALBUMIN 2.9* 3.0* 2.8*    Recent Labs  05/20/16 1658  LIPASE 26   No results for input(s): AMMONIA in the last 8760 hours. CBC:  Recent Labs  06/21/16 1413 07/19/16 1432 08/12/16 08/16/16 08/16/16 1441  WBC 7.1 6.7 8.1 9.0 8.0  NEUTROABS 3.3 3.0  --   --  4.5  HGB 11.8 11.2* 10.5* 11.5* 10.6*  HCT 36.3 35.6 32* 35* 32.8*  MCV 88.9 89.4  --   --  90.4  PLT 245 214 191 220 178   Cardiac Enzymes: No  results for input(s): CKTOTAL, CKMB, CKMBINDEX, TROPONINI in the last 8760 hours. BNP: Invalid input(s): POCBNP Lab Results  Component Value Date   HGBA1C 5.9 01/23/2015   Lab Results  Component Value Date   TSH 3.215 08/25/2015   No results found for: VITAMINB12 No results found for: FOLATE Lab Results  Component Value Date   FERRITIN 74 08/30/2005    Lipid Panel: No results for input(s): CHOL, HDL, LDLCALC, TRIG, CHOLHDL, LDLDIRECT in the last 8760 hours. Lab Results  Component Value Date   HGBA1C 5.9 01/23/2015    Procedures since last visit: Dg Chest 1 View  Result Date: 08/23/2016 CLINICAL DATA:  Right-sided pleural effusion post thoracentesis. EXAM: CHEST 1 VIEW COMPARISON:  Chest x-ray dated March 11, 2016. FINDINGS: The cardiomediastinal silhouette is normal in size. Normal pulmonary vascularity. Atherosclerotic calcification of the aortic arch. Interval decrease in size of small right pleural effusion. No pneumothorax. No consolidation. Bibasilar atelectasis. Multiple healing left-sided rib fractures. Prior right axillary lymph node dissection. IMPRESSION: 1. Interval decrease in size of now small right pleural effusion post thoracentesis. No pneumothorax. 2. Multiple healing left-sided rib fractures. Electronically Signed   By: Titus Dubin M.D.   On: 08/23/2016 15:18   Nm Pet Image Restag (ps) Skull Base To Thigh  Result Date: 08/09/2016 CLINICAL DATA:  Subsequent treatment strategy for breast cancer. EXAM: NUCLEAR MEDICINE PET SKULL BASE TO THIGH TECHNIQUE: 8.2 mCi F-18 FDG was injected intravenously. Full-ring PET imaging was performed from the skull base to thigh after the radiotracer. CT data was obtained and used for attenuation correction and anatomic localization. FASTING BLOOD GLUCOSE:  *Value: 89 mg/dl COMPARISON:  05/10/2016 FINDINGS: NECK: No hypermetabolic lymph nodes in the neck. CHEST: Multiple hypermetabolic left axillary lymph nodes. Corresponding SUV max  is equal to 6.4. Previously 1.4. Increased FDG uptake associated with mediastinal lymph nodes. SUV max associated with right paratracheal node is equal to 5.9. Previously 4.18. SUV max associated with the sub- carinal lymph node is equal to 8.06. Previously 4.38. There is a large right pleural effusion. Similar to previous exam. Chronic paramediastinal consolidation and architectural distortion within the right upper lobe. ABDOMEN/PELVIS: Lesion within the left adrenal gland measures 1.9 cm and has an SUV max equal to 7.9. Previously 1.6 cm with an SUV max of 7.1. Asymmetric uptake within the right adrenal gland has an SUV max equal to 9.9. Previously 4.9. New hypermetabolic activity identified within bilateral adnexal regions. SUV max within the right adnexa is equal to 7.4. On the left the SUV max is equal to 6.7. Increase in abdominal and pelvic ascites. SKELETON: Similar appearance of heterogeneous activity throughout the axial and appendicular skeleton with corresponding diffuse sclerotic bone metastases on CT images. Index hypermetabolic lesion involving the left first rib is equal to  2.53. Previously 3.7. IMPRESSION: 1. Mixed interval response to therapy. 2. New hypermetabolic adenopathy within the left axilla. There has also been increase in FDG uptake associated with mediastinal nodes. 3. Similar FDG uptake associated with left adrenal gland metastasis. There has been increase in hypermetabolism associated with the right adrenal gland. 4. New hypermetabolism associated with bilateral adnexal regions. Cannot rule out metastatic disease to the ovaries. 5. Increase in abdominal and pelvic ascites Electronically Signed   By: Kerby Moors M.D.   On: 08/09/2016 09:41   Ir Thoracentesis Asp Pleural Space W/img Guide  Result Date: 08/24/2016 INDICATION: Shortness of breath. History of breast cancer. Right-sided pleural effusion. Request diagnostic and therapeutic thoracentesis. EXAM: ULTRASOUND GUIDED RIGHT  THORACENTESIS MEDICATIONS: None. COMPLICATIONS: None immediate. Postprocedural chest x-ray negative for pneumothorax. PROCEDURE: An ultrasound guided thoracentesis was thoroughly discussed with the patient and questions answered. The benefits, risks, alternatives and complications were also discussed. The patient understands and wishes to proceed with the procedure. Written consent was obtained. Ultrasound was performed to localize and mark an adequate pocket of fluid in the right chest. The area was then prepped and draped in the normal sterile fashion. 1% Lidocaine was used for local anesthesia. Under ultrasound guidance a Safe-T-Centesis catheter was introduced. Thoracentesis was performed. The catheter was removed and a dressing applied. FINDINGS: A total of approximately 1 L of clear, dark yellow fluid was removed. Samples were sent to the laboratory as requested by the clinical team. IMPRESSION: Successful ultrasound guided right thoracentesis yielding 1 L of pleural fluid. Read by: Ascencion Dike PA-C Electronically Signed   By: Markus Daft M.D.   On: 08/23/2016 15:28    Assessment/Plan  Leg edema Has metastatic breast cancer with ascites to abdomen and pelvis. 3 lb weight gain.  Leg edema is likely from third spacing along with some venous stasis. To keep legs elevated at rest and apply compression stockings. Change furosemide to 40 mg in am and 40 mg at noon for now, add kcl 20 meq daily x 1 week, then 40 mg daily with kcl 10 meq daily.   Hyponatremia Monitor bmp with her on furosemide  Right pleural effusion S/p thoracocentesis. Breathing improved some. Reviewed result of pleural fluid with fluid protein to serum protein >0.5 and fluid LDH elevated - no serum LDH for ratio. Gram stain negative. Cancer antigen elevated. Exudative fluid likely concerning for malignancy. I don't see a cytology report. Pt has breast cancer with metastases. To monitor pt for recollection of fluid/ effusion.   Breast  cancer with metastases Followed by oncology and plan is for her to start with new therapy from tomorrow. She is currently on anastrozole and xgeva and will start fulvestrant from tomorrow.    Labs/tests ordered:  bmp   I spent 40 minutes in total face-to-face time with the patient, more than 50% of which was spent in counseling and coordination of care, reviewing test results, reviewing medication and discussing or reviewing the diagnosis with patient.     Blanchie Serve, MD Internal Medicine [facility] [address] [place], [place] [address] Cell Phone (Monday-Friday 8 am - 5 pm): [phone] On Call: [phone] and follow prompts after 5 pm and on weekends Office Phone: [phone] Office Fax: [phone]

## 2016-09-02 ENCOUNTER — Encounter: Payer: Medicare Other | Admitting: Internal Medicine

## 2016-09-02 ENCOUNTER — Ambulatory Visit (HOSPITAL_BASED_OUTPATIENT_CLINIC_OR_DEPARTMENT_OTHER): Payer: Medicare Other

## 2016-09-02 VITALS — BP 136/56 | HR 85 | Temp 97.8°F | Resp 18

## 2016-09-02 DIAGNOSIS — S2242XG Multiple fractures of ribs, left side, subsequent encounter for fracture with delayed healing: Secondary | ICD-10-CM

## 2016-09-02 DIAGNOSIS — M818 Other osteoporosis without current pathological fracture: Secondary | ICD-10-CM

## 2016-09-02 DIAGNOSIS — Z5111 Encounter for antineoplastic chemotherapy: Secondary | ICD-10-CM | POA: Diagnosis present

## 2016-09-02 DIAGNOSIS — G893 Neoplasm related pain (acute) (chronic): Principal | ICD-10-CM

## 2016-09-02 DIAGNOSIS — C7951 Secondary malignant neoplasm of bone: Secondary | ICD-10-CM | POA: Diagnosis not present

## 2016-09-02 DIAGNOSIS — C50912 Malignant neoplasm of unspecified site of left female breast: Secondary | ICD-10-CM | POA: Diagnosis not present

## 2016-09-02 MED ORDER — FULVESTRANT 250 MG/5ML IM SOLN
500.0000 mg | Freq: Once | INTRAMUSCULAR | Status: AC
Start: 2016-09-02 — End: 2016-09-02
  Administered 2016-09-02: 500 mg via INTRAMUSCULAR
  Filled 2016-09-02: qty 10

## 2016-09-02 NOTE — Patient Instructions (Signed)

## 2016-09-06 DIAGNOSIS — E871 Hypo-osmolality and hyponatremia: Secondary | ICD-10-CM | POA: Diagnosis not present

## 2016-09-06 DIAGNOSIS — D72829 Elevated white blood cell count, unspecified: Secondary | ICD-10-CM | POA: Diagnosis not present

## 2016-09-09 DIAGNOSIS — I10 Essential (primary) hypertension: Secondary | ICD-10-CM | POA: Diagnosis not present

## 2016-09-09 DIAGNOSIS — R739 Hyperglycemia, unspecified: Secondary | ICD-10-CM | POA: Diagnosis not present

## 2016-09-09 LAB — BASIC METABOLIC PANEL
BUN: 9 (ref 4–21)
Creatinine: 0.8 (ref 0.5–1.1)
Glucose: 107
Potassium: 4.4 (ref 3.4–5.3)
Sodium: 130 — AB (ref 137–147)

## 2016-09-10 ENCOUNTER — Encounter: Payer: Self-pay | Admitting: *Deleted

## 2016-09-14 ENCOUNTER — Other Ambulatory Visit (HOSPITAL_BASED_OUTPATIENT_CLINIC_OR_DEPARTMENT_OTHER): Payer: Medicare Other

## 2016-09-14 ENCOUNTER — Ambulatory Visit (HOSPITAL_BASED_OUTPATIENT_CLINIC_OR_DEPARTMENT_OTHER): Payer: Medicare Other

## 2016-09-14 ENCOUNTER — Other Ambulatory Visit: Payer: Self-pay | Admitting: *Deleted

## 2016-09-14 ENCOUNTER — Ambulatory Visit (HOSPITAL_BASED_OUTPATIENT_CLINIC_OR_DEPARTMENT_OTHER): Payer: Medicare Other | Admitting: Oncology

## 2016-09-14 VITALS — BP 141/59 | HR 84 | Temp 97.8°F | Resp 18 | Ht 61.0 in | Wt 128.1 lb

## 2016-09-14 DIAGNOSIS — M810 Age-related osteoporosis without current pathological fracture: Secondary | ICD-10-CM

## 2016-09-14 DIAGNOSIS — S2242XG Multiple fractures of ribs, left side, subsequent encounter for fracture with delayed healing: Secondary | ICD-10-CM

## 2016-09-14 DIAGNOSIS — C7951 Secondary malignant neoplasm of bone: Secondary | ICD-10-CM | POA: Diagnosis not present

## 2016-09-14 DIAGNOSIS — C50919 Malignant neoplasm of unspecified site of unspecified female breast: Secondary | ICD-10-CM

## 2016-09-14 DIAGNOSIS — G893 Neoplasm related pain (acute) (chronic): Secondary | ICD-10-CM

## 2016-09-14 DIAGNOSIS — C50911 Malignant neoplasm of unspecified site of right female breast: Secondary | ICD-10-CM

## 2016-09-14 DIAGNOSIS — C7972 Secondary malignant neoplasm of left adrenal gland: Secondary | ICD-10-CM

## 2016-09-14 DIAGNOSIS — C7970 Secondary malignant neoplasm of unspecified adrenal gland: Secondary | ICD-10-CM

## 2016-09-14 DIAGNOSIS — Z170 Estrogen receptor positive status [ER+]: Secondary | ICD-10-CM

## 2016-09-14 DIAGNOSIS — Z5111 Encounter for antineoplastic chemotherapy: Secondary | ICD-10-CM

## 2016-09-14 DIAGNOSIS — M818 Other osteoporosis without current pathological fracture: Secondary | ICD-10-CM

## 2016-09-14 DIAGNOSIS — C7971 Secondary malignant neoplasm of right adrenal gland: Secondary | ICD-10-CM

## 2016-09-14 LAB — COMPREHENSIVE METABOLIC PANEL
ALBUMIN: 2.6 g/dL — AB (ref 3.5–5.0)
ALK PHOS: 300 U/L — AB (ref 40–150)
ALT: 30 U/L (ref 0–55)
AST: 63 U/L — AB (ref 5–34)
Anion Gap: 7 mEq/L (ref 3–11)
BILIRUBIN TOTAL: 0.62 mg/dL (ref 0.20–1.20)
BUN: 12.9 mg/dL (ref 7.0–26.0)
CO2: 27 meq/L (ref 22–29)
CREATININE: 0.9 mg/dL (ref 0.6–1.1)
Calcium: 8.3 mg/dL — ABNORMAL LOW (ref 8.4–10.4)
Chloride: 96 mEq/L — ABNORMAL LOW (ref 98–109)
EGFR: 58 mL/min/{1.73_m2} — ABNORMAL LOW (ref >90)
Glucose: 140 mg/dl (ref 70–140)
POTASSIUM: 4.1 meq/L (ref 3.5–5.1)
SODIUM: 130 meq/L — AB (ref 136–145)
Total Protein: 5.4 g/dL — ABNORMAL LOW (ref 6.4–8.3)

## 2016-09-14 LAB — CBC WITH DIFFERENTIAL/PLATELET
BASO%: 0.7 % (ref 0.0–2.0)
Basophils Absolute: 0 10*3/uL (ref 0.0–0.1)
EOS%: 6.1 % (ref 0.0–7.0)
Eosinophils Absolute: 0.4 10*3/uL (ref 0.0–0.5)
HEMATOCRIT: 33.6 % — AB (ref 34.8–46.6)
HEMOGLOBIN: 11.1 g/dL — AB (ref 11.6–15.9)
LYMPH#: 2.6 10*3/uL (ref 0.9–3.3)
LYMPH%: 35.6 % (ref 14.0–49.7)
MCH: 29.9 pg (ref 25.1–34.0)
MCHC: 33 g/dL (ref 31.5–36.0)
MCV: 90.8 fL (ref 79.5–101.0)
MONO#: 1.1 10*3/uL — ABNORMAL HIGH (ref 0.1–0.9)
MONO%: 14.9 % — AB (ref 0.0–14.0)
NEUT%: 42.7 % (ref 38.4–76.8)
NEUTROS ABS: 3.1 10*3/uL (ref 1.5–6.5)
Platelets: 216 10*3/uL (ref 145–400)
RBC: 3.7 10*6/uL (ref 3.70–5.45)
RDW: 17.6 % — AB (ref 11.2–14.5)
WBC: 7.2 10*3/uL (ref 3.9–10.3)

## 2016-09-14 MED ORDER — FULVESTRANT 250 MG/5ML IM SOLN
500.0000 mg | Freq: Once | INTRAMUSCULAR | Status: AC
  Administered 2016-09-14: 500 mg via INTRAMUSCULAR
  Filled 2016-09-14: qty 10

## 2016-09-14 MED ORDER — DENOSUMAB 120 MG/1.7ML ~~LOC~~ SOLN
120.0000 mg | Freq: Once | SUBCUTANEOUS | Status: AC
  Administered 2016-09-14: 120 mg via SUBCUTANEOUS
  Filled 2016-09-14: qty 1.7

## 2016-09-14 NOTE — Progress Notes (Signed)
[place]  [phone] [phone]     ID: Cassandra Glenn DOB: [birth year]  MR#: [id number]  YBW#:[id number]  Patient Care Team: Blanchie Serve, MD as PCP - General (Internal Medicine) Melina Modena, [facility] Shon Hough, MD as Consulting Physician (Ophthalmology) Molli Posey, MD as Consulting Physician (Obstetrics and Gynecology) Gerarda Fraction, MD as Consulting Physician (Ophthalmology) Lorelle Gibbs, MD as Consulting Physician (Radiology) Melrose Nakayama, MD as Consulting Physician (Orthopedic Surgery) Gerarda Fraction, MD as Referring Physician (Ophthalmology) Ardis Hughs, MD as Attending Physician (Urology) Magrinat, Virgie Dad, MD as Consulting Physician (Oncology) Ngetich, Nelda Bucks, NP as Nurse Practitioner (Family Medicine) Chauncey Cruel, MD OTHER MD:  CHIEF COMPLAINT: 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 Jaleya continues on anastrozole, with good tolerance. Pt reports that she has fatigue that is similar to recent occurrences of fatigue. Pt denies pain following fulvestrant injection and will receive denosumab and fulvestrant injections today. Pt reports that she has taken 2 TUMS prior to her appointment today. She notes that her watch battery has died and that she is attempting to get a new battery to ensure that she is on time for her appointments.   She states that her oxygen levels are checked 3-4 times daily at her nursing home facility and if it is below 90%, then she is placed on oxygen. Pt denies her oxygen levels being below 90% recently. Pt reports that she is unsure if the recent thoracentesis (08/23/2016) that was completed to remove excess fluid alleviated her breathing troubles. She notes  that she recently experienced sharp mid back pain worse on the right that she treated with tylenol and resolved.   REVIEW OF SYSTEMS: Pt reports associated intermittent breathing issues and resolved mid back pain worse on right. A detailed review of systems today was otherwise stable  BREAST CANCER HISTORY: From the original intake note:  I saw Cassandra Glenn approximately approximately 13 years ago when she was diagnosed with right-sided invasive breast cancer, which was treated with radiation and tamoxifen for 5 years. She also had a noninvasive breast cancer removed from the left breast at the same time.  More recently [name] had a chest x-ray 10/22/2015 for evaluation of a cough. The film showed a variety of incidental findings including atherosclerosis of the thoracic aorta and an old lower thoracic compression fracture. There was also however a right apical density which required further evaluation. Accordingly on 10/27/2015 she had a chest CT scan which found chronic areas of scarring in both lungs, not significantly changed from November 2015. However right middle lobe pleural thickening appeared worse, and there was increased nodular pleural thickening along the azygo-esophageal recess, suggestive of an indolent mesothelioma. There was a small cluster of nodules at the medial right lung base which was new from the prior study. There were no bony metastases but there were multiple chronic compression deformities and degenerative change.   Given these results, a PET scan was obtained 11/07/2015. There was a hypermetabolic right paratracheal lymph node measuring 1.2 cm, but no additional mediastinal adenopathy. Along the pleural surface of the azygo-esophageal region there was increased metabolic activity with an SUV max of 20.9. There was also intense metabolic activity associated with a thickened left adrenal gland, with an SUV max of 10.8. There was some hypermetabolic activity associated with the  right  adrenal gland. There were again no bony metastatic disease findings.  With this information the patient returns for further evaluation and treatment.   PAST MEDICAL HISTORY: Past Medical History:  Diagnosis Date  . Abnormal PET scan of mediastinum 11/11/2015  . Abnormalities of the hair 02/29/2012  . Arthritis   . BPV (benign positional vertigo)   . Cancer ([place])   . Cancer antigen 125 (CA 125) elevation 11/27/2013  . Candidiasis of skin and nails 03/19/2011  . 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 Chorioretinal scar, macular 01/23/2015  . Closed fracture of base of neck of femur ([place]) 03/18/2011  . Compression fracture of thoracic vertebra ([facility]) 11/24/2012  . Contact dermatitis and other eczema, due to unspecified cause 05/07/2011  . Depression 07/23/2014  . Diverticulosis of colon (without mention of hemorrhage) 03/19/2011  . Edema 05/07/2011  . HOH (hard of hearing)   . Hypertension 02/24/2016  . Hypopotassemia 03/26/2011  . Hyposmolality and/or hyponatremia 03/19/2011  . Hypotension, unspecified 03/18/2011  . IBS (irritable bowel syndrome) 11/27/2013  . Idiopathic scoliosis 01/27/2016  . Impaired fasting glucose 03/19/2011  . Insomnia, unspecified 03/19/2011  . Kidney infection   . Lumbago 03/19/2011  . Macular degeneration   . Malignant neoplasm of breast (female), unspecified site 10/28/2002  . Mass of right lung 10/23/2015  . Metastasis to adrenal gland ([place]) 11/21/2015   Breast cancer  . Metastatic breast cancer ([place]) 10/27/2004  . Nonexudative age-related macular degeneration 12/10/2010  . Osteoporosis 08/06/2014  . Osteoporosis, unspecified 03/19/2011  . Other malaise and fatigue 03/19/2011  . Pain in joint, pelvic region and thigh 03/19/2011  . Primary osteoarthritis of right hip 05/28/2014   TOTAL HIP ARTHROPLASTY ANTERIOR APPROACH HARDWARE REMOVAL on 05/28/2014   . Purpura senilis ([place]) 07/31/2013  . Radiculopathy, cervical 03/23/2016   Right C6  . Rib  fractures   . Right bundle branch block 08/30/2011  . Shortness of breath   . Spasm of muscle 03/19/2011  . Spinal stenosis, lumbar region, with neurogenic claudication 11/02/2011  . TMJ click 9/52/8413  . Trigger finger (acquired) 11/02/2011  . Unspecified constipation 03/19/2011  . Urinary frequency 04/24/2013  . UTI (urinary tract infection) 04/16/2013   04/16/13 pseudomonas aeruginosa: tx with Cipro 06/15/14 P. Aeruginosa Fortaz 54m IM q12h x 7 days.     . Vaginitis and vulvovaginitis 02/29/2012  . Vertigo 08/26/2015    PAST SURGICAL HISTORY: Past Surgical History:  Procedure Laterality Date  . BREAST SURGERY Bilateral 2005-10/27/2004   lumpectomy- Streck,MD  . CATARACT EXTRACTION  2010   bialteral  . EYE SURGERY     cataract  . FRACTURE SURGERY Left 09/1980   ankle  . HARDWARE REMOVAL Right 05/28/2014   Procedure: HARDWARE REMOVAL;  Surgeon: PMelrose Nakayama MD;  [facility]  Service: Orthopedics;  Laterality: Right;  . HIP PINNING,CANNULATED  03/08/2011   Procedure: CANNULATED HIP PINNING;  Surgeon: JJohnn Hai MD;  Location: [facility];  Service: Orthopedics;  Laterality: Right;  . IR THORACENTESIS ASP PLEURAL SPACE W/IMG GUIDE  08/23/2016  . ORIF HIP FRACTURE Right 03/08/2011   Bean,MD  . SKIN CANCER EXCISION Left 10/12/2012   lower leg Dr. [place] . SKIN LESION EXCISION Right 02/04/2011   Abdomen lesion spongiotic dermatitis-Taffeen, MD  . SQUAMOUS CELL CARCINOMA EXCISION Right 02/04/2011   forearm-[place] MD  . TONSILLECTOMY    . TOTAL HIP ARTHROPLASTY Right 05/28/2014   Procedure: TOTAL HIP ARTHROPLASTY ANTERIOR APPROACH;  Surgeon: PMelrose Nakayama MD;  [place] [facility]  Service:  Orthopedics;  Laterality: Right;    FAMILY HISTORY Family History  Problem Relation Age of Onset  . Heart disease Mother   . Cancer Father   . Emphysema Brother   . Alzheimer's disease Brother   The patient's father died at the age of 22 with colon cancer. The patient's mother died at  the age of 34 from heart disease the patient had 2 brothers, no sisters. Both brothers have died, one from emphysema and one from dementia.  GYNECOLOGIC HISTORY:  No LMP recorded. Patient is postmenopausal. Menarche age 52, first live birth age 27, the patient is [place] P2. She stopped having periods around age 67. She never took hormone replacement.  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 She is widowed. She lives in independent living section of friend's home [place]. Her daughter Mardene Celeste lives in [place] (actually [place]) where she teaches. Daughter [name] lives in [place] and is Web designer at [employer]. The patient has 5 grandchildren and 2 step grandchildren. She has one great grandchild and one on the way. She attends [employer]    ADVANCED DIRECTIVES: Margarete has named both her daughters as joint health care power of attorney. She tells me she has a living will "somewhere in the house" and has a DO NOT RESUSCITATE order written at friends homes.   HEALTH MAINTENANCE: Social History  Substance Use Topics  . Smoking status: Never Smoker  . Smokeless tobacco: Never Used  . Alcohol use 0.0 oz/week     Comment: occasional glass     Colonoscopy:  PAP:  Bone density:   Allergies  Allergen Reactions  . Macrodantin [Nitrofurantoin] Other (See Comments)    Reaction:  Unknown   . Nystatin Swelling    Current Outpatient Prescriptions  Medication Sig Dispense Refill  . acetaminophen (TYLENOL) 500 MG tablet Take 1,000 mg by mouth every 6 (six) hours as needed for moderate pain.     Marland Kitchen ALPRAZolam (XANAX) 0.5 MG tablet Take 0.5 mg by mouth daily. In addition to scheduled daily dose pt may take 2 x day prn    . anastrozole (ARIMIDEX) 1 MG tablet Take 1 tablet (1 mg total) by mouth daily. 90 tablet 4  . antiseptic oral rinse (BIOTENE) LIQD 15 mLs by Mouth Rinse route 2 (two)  times daily as needed for dry mouth.     Marland Kitchen aspirin 81 MG chewable tablet Chew 81 mg by mouth daily.     . Bilberry 150 MG CAPS Take 150 mg by mouth daily.    . Calcium Carbonate-Vitamin D (CALCIUM 600+D) 600-400 MG-UNIT tablet Take 1 tablet by mouth 2 (two) times daily.    . Cholecalciferol 1000 units tablet Take 1,000 Units by mouth daily.    . Cranberry-Cholecalciferol (SUPER CRANBERRY/VITAMIN D3) 4200-500 MG-UNIT CAPS Take 1 capsule by mouth daily.    . Diphenhyd-Benzeth-Menth-Zn Ace (CALAGEL MAXIMUM STRENGTH EX) Apply 1 application topically as directed. 2 x day    . furosemide (LASIX) 40 MG tablet Take 1 tablet (40 mg total) by mouth daily. Hold if SBP < 110 30 tablet   . Glucosamine-Chondroitin (GLUCOSAMINE CHONDR COMPLEX PO) Take 2 tablets by mouth daily. for joints    . guaiFENesin (MUCINEX) 600 MG 12 hr tablet Take 600 mg by mouth 2 (two) times daily as needed for cough.    . Lactobacillus Rhamnosus, GG, (CULTURELLE PO) Take 1 tablet by mouth daily.    Marland Kitchen loperamide (  IMODIUM A-D) 2 MG tablet Take 4 mg by mouth as needed for diarrhea or loose stools. Give 4 mg by mouth as the initial dose, then 2 mg by mouth after each loose stool.    . magnesium hydroxide (MILK OF MAGNESIA) 400 MG/5ML suspension Take 30 mLs by mouth as needed for mild constipation.    . meclizine (ANTIVERT) 25 MG tablet Take 1 tablet (25 mg total) by mouth 3 (three) times daily as needed for dizziness. 30 tablet 0  . Meth-Hyo-M Bl-Na Phos-Ph Sal (URIBEL) 118 MG CAPS Take 1 capsule by mouth 3 (three) times daily as needed.    . mirtazapine (REMERON) 7.5 MG tablet Take 7.5 mg by mouth at bedtime.    . Multiple Vitamins-Minerals (PRESERVISION/LUTEIN) CAPS Take 1 capsule by mouth 2 (two) times daily.    . NON FORMULARY 1 bottle of gatorade as needed at residents request    . Omega-3 Fatty Acids (FISH OIL) 1000 MG CAPS Take 1 capsule by mouth daily.    Marland Kitchen omeprazole (PRILOSEC) 40 MG capsule Take 40 mg by mouth daily.    .  ondansetron (ZOFRAN-ODT) 4 MG disintegrating tablet Take 4 mg by mouth every 8 (eight) hours as needed for nausea or vomiting.    . polycarbophil (FIBERCON) 625 MG tablet Take 625 mg by mouth 2 (two) times daily.    . polyethylene glycol (MIRALAX / GLYCOLAX) packet Take 17 g by mouth at bedtime. Pt also uses once daily as needed for constipation.    . traMADol (ULTRAM) 50 MG tablet Take 50 mg by mouth 4 (four) times daily -  with meals and at bedtime.     Marland Kitchen trimethoprim (TRIMPEX) 100 MG tablet Take 100 mg by mouth at bedtime.    . potassium chloride (K-DUR,KLOR-CON) 10 MEQ tablet Take 10 mEq by mouth daily.     No current facility-administered medications for this visit.     OBJECTIVE: older White woman Who appears stated age  108:   09/14/16 1342  BP: (!) 141/59  Pulse: 84  Resp: 18  Temp: 97.8 F (36.6 C)  SpO2: 96%     Body mass index is 24.2 kg/m.    ECOG FS:2 - Symptomatic, <50% confined to bed   Sclerae unicteric, EOMs intact Oropharynx clear and moist No cervical or supraclavicular adenopathy Lungs no rales or rhonchi Heart regular rate and rhythm Abd soft, nontender, positive bowel sounds MSK no focal spinal tenderness, no upper extremity lymphedema Neuro: nonfocal, well oriented, appropriate affect Breasts: Deferred   LAB RESULTS:  CMP     Component Value Date/Time   NA 130 (L) 09/14/2016 1326   K 4.1 09/14/2016 1326   CL 100 (L) 05/20/2016 1658   CO2 27 09/14/2016 1326   GLUCOSE 140 09/14/2016 1326   BUN 12.9 09/14/2016 1326   CREATININE 0.9 09/14/2016 1326   CALCIUM 8.3 (L) 09/14/2016 1326   PROT 5.4 (L) 09/14/2016 1326   ALBUMIN 2.6 (L) 09/14/2016 1326   AST 63 (H) 09/14/2016 1326   ALT 30 09/14/2016 1326   ALKPHOS 300 (H) 09/14/2016 1326   BILITOT 0.62 09/14/2016 1326   GFRNONAA >60 05/20/2016 1658   GFRNONAA 63 01/19/2016 0800   GFRAA >60 05/20/2016 1658   GFRAA 72 01/19/2016 0800    INo results found for: SPEP, UPEP  Lab Results    Component Value Date   WBC 7.2 09/14/2016   NEUTROABS 3.1 09/14/2016   HGB 11.1 (L) 09/14/2016   HCT 33.6 (L) 09/14/2016  MCV 90.8 09/14/2016   PLT 216 09/14/2016      Chemistry      Component Value Date/Time   NA 130 (L) 09/14/2016 1326   K 4.1 09/14/2016 1326   CL 100 (L) 05/20/2016 1658   CO2 27 09/14/2016 1326   BUN 12.9 09/14/2016 1326   CREATININE 0.9 09/14/2016 1326   GLU 107 09/09/2016      Component Value Date/Time   CALCIUM 8.3 (L) 09/14/2016 1326   ALKPHOS 300 (H) 09/14/2016 1326   AST 63 (H) 09/14/2016 1326   ALT 30 09/14/2016 1326   BILITOT 0.62 09/14/2016 1326       Lab Results  Component Value Date   LABCA2 19 11/07/2007    No components found for: XLKGM010  No results for input(s): INR in the last 168 hours.  Urinalysis    Component Value Date/Time   COLORURINE YELLOW 05/20/2016 1635   APPEARANCEUR HAZY (A) 05/20/2016 1635   LABSPEC 1.013 05/20/2016 1635   LABSPEC 1.015 03/11/2016 1307   PHURINE 6.0 05/20/2016 1635   GLUCOSEU NEGATIVE 05/20/2016 1635   GLUCOSEU Negative 03/11/2016 1307   HGBUR NEGATIVE 05/20/2016 1635   BILIRUBINUR NEGATIVE 05/20/2016 1635   BILIRUBINUR Negative 03/11/2016 [place] 05/20/2016 1635   PROTEINUR NEGATIVE 05/20/2016 1635   UROBILINOGEN 0.2 03/11/2016 1307   NITRITE NEGATIVE 05/20/2016 1635   LEUKOCYTESUR SMALL (A) 05/20/2016 1635   LEUKOCYTESUR Color Interference 03/11/2016 1307     STUDIES: Dg Chest 1 View  Result Date: 08/23/2016 CLINICAL DATA:  Right-sided pleural effusion post thoracentesis. EXAM: CHEST 1 VIEW COMPARISON:  Chest x-ray dated March 11, 2016. FINDINGS: The cardiomediastinal silhouette is normal in size. Normal pulmonary vascularity. Atherosclerotic calcification of the aortic arch. Interval decrease in size of small right pleural effusion. No pneumothorax. No consolidation. Bibasilar atelectasis. Multiple healing left-sided rib fractures. Prior right axillary lymph node  dissection. IMPRESSION: 1. Interval decrease in size of now small right pleural effusion post thoracentesis. No pneumothorax. 2. Multiple healing left-sided rib fractures. Electronically Signed   By: Titus Dubin M.D.   On: 08/23/2016 15:18   Ir Thoracentesis Asp Pleural Space W/img Guide  Result Date: 08/24/2016 INDICATION: Shortness of breath. History of breast cancer. Right-sided pleural effusion. Request diagnostic and therapeutic thoracentesis. EXAM: ULTRASOUND GUIDED RIGHT THORACENTESIS MEDICATIONS: None. COMPLICATIONS: None immediate. Postprocedural chest x-ray negative for pneumothorax. PROCEDURE: An ultrasound guided thoracentesis was thoroughly discussed with the patient and questions answered. The benefits, risks, alternatives and complications were also discussed. The patient understands and wishes to proceed with the procedure. Written consent was obtained. Ultrasound was performed to localize and mark an adequate pocket of fluid in the right chest. The area was then prepped and draped in the normal sterile fashion. 1% Lidocaine was used for local anesthesia. Under ultrasound guidance a Safe-T-Centesis catheter was introduced. Thoracentesis was performed. The catheter was removed and a dressing applied. FINDINGS: A total of approximately 1 L of clear, dark yellow fluid was removed. Samples were sent to the laboratory as requested by the clinical team. IMPRESSION: Successful ultrasound guided right thoracentesis yielding 1 L of pleural fluid. Read by: Ascencion Dike PA-C Electronically Signed   By: Markus Daft M.D.   On: 08/23/2016 15:28    ELIGIBLE FOR AVAILABLE RESEARCH PROTOCOL: no  ASSESSMENT: 83 y.o.  woman residing at [facility] [place]  (1) status post right lumpectomy on 10/27/2004 for an invasive lobular carcinoma, grade 1, estrogen and progesterone receptor positive, Ki-67 3%, HER-2 negative, and  so T2N2, treated with radiation and tamoxifen for 5 years  (a) Also status  post left lumpectomy for ductal carcinoma in situ  METASTATIC DISEASE: November 2017 (2) chest CT scan and PET scan obtained October 2017 show a hot 1.2 cm right paratracheal lymph node, a rim of activity along the pleural surface of the a zygo-esophageal region, and bilateral adrenal metastases, left greater than right   (a) biopsy of the left adrenal gland 12/03/2015 shows metastatic carcinoma, estrogen and progesterone receptor positive, HER-2 nonamplified  (b) CA-27-29 is informative  (3) tamoxifen started 01/20/2015, discontinued March 2018 with concerns regarding progression  (4) anastrozole started March 2018  (a) Xgeva started 03/29/2016, to be repeated monthly, held after one dose because of hypocalcemia, resumed 06/21/2016  (5) fulvestrant added 09/02/2016  PLAN: Niambi tolerated the first fulvestrant does well, 2 weeks ago. She will receive a dose today together with her denosumab/Xgeva, and from now and she will be treated every 28 days, with both drugs given at the same time, to minimize the need for transportation.  Her CA to signs and 9 is informative. It was up to 369.3 an month ago. Very frequently we see a slight rise and then plateau and then a decrease so I am not anticipating a rapid change in the pattern with fulvestrant, but certainly we would expect that that after 3 or 4 months a decline in the CA 2729 would be expected.  I'm going to see her again in November. She will be set up for restaging PET scan in December. Of course I will be glad to see her before then if any other problems develop.  Magrinat, Virgie Dad, MD  09/14/16 5:54 PM Medical Oncology and Hematology [facility] [address] [place], [place] [address] Tel. [phone]    Fax. [phone]  This document serves as a record of services personally performed by Lurline Del, MD. It was created on her behalf by Steva Colder, a trained medical scribe. The creation of this record is  based on the scribe's personal observations and the provider's statements to them. This document has been checked and approved by the attending provider.

## 2016-09-15 LAB — CANCER ANTIGEN 27.29: CAN 27.29: 535.2 U/mL — AB (ref 0.0–38.6)

## 2016-09-22 ENCOUNTER — Ambulatory Visit (INDEPENDENT_AMBULATORY_CARE_PROVIDER_SITE_OTHER): Payer: Medicare Other | Admitting: Podiatry

## 2016-09-22 DIAGNOSIS — M79676 Pain in unspecified toe(s): Secondary | ICD-10-CM

## 2016-09-22 DIAGNOSIS — B351 Tinea unguium: Secondary | ICD-10-CM | POA: Diagnosis not present

## 2016-09-22 NOTE — Progress Notes (Signed)
Complaint:  Visit Type: Patient returns to my office for continued preventative foot care services. Complaint: Patient states" my nails have grown long and thick and become painful to walk and wear shoes" Patient has been diagnosed with DM with no foot complications. The patient presents for preventative foot care services. No changes to ROS  Podiatric Exam: Vascular: dorsalis pedis and posterior tibial pulses are palpable bilateral. Capillary return is immediate. Temperature gradient is WNL. Skin turgor WNL  Sensorium: Normal Semmes Weinstein monofilament test. Normal tactile sensation bilaterally. Nail Exam: Pt has thick disfigured discolored nails with subungual debris noted bilateral entire nail hallux through fifth toenails Ulcer Exam: There is no evidence of ulcer or pre-ulcerative changes or infection. Orthopedic Exam: Muscle tone and strength are WNL. No limitations in general ROM. No crepitus or effusions noted. Foot type and digits show no abnormalities. Bony prominences are unremarkable. Skin: No Porokeratosis. No infection or ulcers  Diagnosis:  Onychomycosis, , Pain in right toe, pain in left toes. Pincer toenails  Treatment & Plan Procedures and Treatment: Consent by patient was obtained for treatment procedures. The patient understood the discussion of treatment and procedures well. All questions were answered thoroughly reviewed. Debridement of mycotic and hypertrophic toenails, 1 through 5 bilateral and clearing of subungual debris. No ulceration, no infection noted.  Return Visit-Office Procedure: Patient instructed to return to the office for a follow up visit 3 months for continued evaluation and treatment.    Gardiner Barefoot DPM

## 2016-09-27 ENCOUNTER — Encounter: Payer: Self-pay | Admitting: Family

## 2016-09-27 ENCOUNTER — Non-Acute Institutional Stay: Payer: Medicare Other | Admitting: Family

## 2016-09-27 DIAGNOSIS — Y92129 Unspecified place in nursing home as the place of occurrence of the external cause: Secondary | ICD-10-CM | POA: Diagnosis not present

## 2016-09-27 DIAGNOSIS — I4891 Unspecified atrial fibrillation: Secondary | ICD-10-CM | POA: Diagnosis not present

## 2016-09-27 DIAGNOSIS — R5383 Other fatigue: Secondary | ICD-10-CM | POA: Diagnosis not present

## 2016-09-27 DIAGNOSIS — W19XXXA Unspecified fall, initial encounter: Secondary | ICD-10-CM

## 2016-09-27 DIAGNOSIS — I10 Essential (primary) hypertension: Secondary | ICD-10-CM | POA: Diagnosis not present

## 2016-09-27 DIAGNOSIS — E871 Hypo-osmolality and hyponatremia: Secondary | ICD-10-CM | POA: Diagnosis not present

## 2016-09-27 LAB — BASIC METABOLIC PANEL
BUN: 12 (ref 4–21)
Creatinine: 0.8 (ref 0.5–1.1)
Glucose: 146
Potassium: 4.3 (ref 3.4–5.3)
Sodium: 127 — AB (ref 137–147)

## 2016-09-27 LAB — HEPATIC FUNCTION PANEL
ALK PHOS: 226 — AB (ref 25–125)
ALT: 19 (ref 7–35)
AST: 46 — AB (ref 13–35)
BILIRUBIN, TOTAL: 0.7

## 2016-09-27 LAB — CBC AND DIFFERENTIAL
HCT: 32 — AB (ref 36–46)
HEMOGLOBIN: 10.5 — AB (ref 12.0–16.0)
Platelets: 209 (ref 150–399)
WBC: 8.7

## 2016-09-27 NOTE — Progress Notes (Addendum)
Location:  [facility] Room Number: 8 Place of Service:  ALF [phone]) Provider: Fedora Knisely FNP-C  Blanchie Serve, MD  Patient Care Team: Blanchie Serve, MD as PCP - General (Internal Medicine) Melina Modena, [facility] Shon Hough, MD as Consulting Physician (Ophthalmology) Molli Posey, MD as Consulting Physician (Obstetrics and Gynecology) Gerarda Fraction, MD as Consulting Physician (Ophthalmology) Lorelle Gibbs, MD as Consulting Physician (Radiology) Melrose Nakayama, MD as Consulting Physician (Orthopedic Surgery) Gerarda Fraction, MD as Referring Physician (Ophthalmology) Ardis Hughs, MD as Attending Physician (Urology) Magrinat, Virgie Dad, MD as Consulting Physician (Oncology) Helyn Schwan, Nelda Bucks, NP as Nurse Practitioner (Family Medicine)  Extended Emergency Contact Information Primary Emergency Contact: Black,Emily Address: [address]          [place], [place] [address] [name] of [place] Phone: [phone] Mobile Phone: [phone] Relation: Daughter Secondary Emergency Contact: Marcine Matar [place] Mobile Phone: [phone] Relation: Daughter  Code Status:  DNR Goals of care: Advanced Directive information Advanced Directives 08/25/2016  Does Patient Have a Medical Advance Directive? Yes  Type of Paramedic of [place];Living will;Out of facility DNR (pink MOST or yellow form)  Does patient want to make changes to medical advance directive? No - Patient declined  Copy of [place] in Chart? Yes  Would patient like information on creating a medical advance directive? -  Pre-existing out of facility DNR order (yellow form or pink MOST form) Yellow form placed in chart (order not valid for inpatient use);Pink MOST form placed in chart (order not valid for inpatient use)     Chief Complaint  Patient presents with  . Acute Visit    fall    HPI:  Pt is a 83  y.o. female seen today at [facility] for an acute visit for evaluation of fall encounter. She is seen in her room today with her daughter and facility Nurse supervisor at bedside. Facility Nurse reports patient sustained. No injuries sustained.Patient states was trying to get to the closet but seems " My legs tangled and I went easy on the floor". She complains of fatigue all the time. She denies any acute pain, fever, chills or urinary tract infections. Patient's daughter request urine specimen to be obtained to check for urinary tract infection despite no urinary tract infection symptoms. She is currently on Bactrim for UTI prophylaxis. Also does self cath due to urinary retention.       Past Medical History:  Diagnosis Date  . Abnormal PET scan of mediastinum 11/11/2015  . Abnormalities of the hair 02/29/2012  . Arthritis   . BPV (benign positional vertigo)   . Cancer ([place])   . Cancer antigen 125 (CA 125) elevation 11/27/2013  . Candidiasis of skin and nails 03/19/2011  . 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 Chorioretinal scar, macular 01/23/2015  . Closed fracture of base of neck of femur ([place]) 03/18/2011  . Compression fracture of thoracic vertebra ([facility]) 11/24/2012  . Contact dermatitis and other eczema, due to unspecified cause 05/07/2011  . Depression 07/23/2014  . Diverticulosis of colon (without mention of hemorrhage) 03/19/2011  . Edema 05/07/2011  . HOH (hard of hearing)   . Hypertension 02/24/2016  . Hypopotassemia 03/26/2011  . Hyposmolality and/or hyponatremia 03/19/2011  . Hypotension, unspecified 03/18/2011  . IBS (irritable bowel syndrome) 11/27/2013  . Idiopathic scoliosis 01/27/2016  . Impaired fasting glucose 03/19/2011  . Insomnia, unspecified 03/19/2011  . Kidney infection   . Lumbago 03/19/2011  .  Macular degeneration   . Malignant neoplasm of breast (female), unspecified site 10/28/2002  . Mass of right lung 10/23/2015  . Metastasis to adrenal gland ([place])  11/21/2015   Breast cancer  . Metastatic breast cancer ([place]) 10/27/2004  . Nonexudative age-related macular degeneration 12/10/2010  . Osteoporosis 08/06/2014  . Osteoporosis, unspecified 03/19/2011  . Other malaise and fatigue 03/19/2011  . Pain in joint, pelvic region and thigh 03/19/2011  . Primary osteoarthritis of right hip 05/28/2014   TOTAL HIP ARTHROPLASTY ANTERIOR APPROACH HARDWARE REMOVAL on 05/28/2014   . Purpura senilis ([place]) 07/31/2013  . Radiculopathy, cervical 03/23/2016   Right C6  . Rib fractures   . Right bundle branch block 08/30/2011  . Shortness of breath   . Spasm of muscle 03/19/2011  . Spinal stenosis, lumbar region, with neurogenic claudication 11/02/2011  . TMJ click 9/32/6712  . Trigger finger (acquired) 11/02/2011  . Unspecified constipation 03/19/2011  . Urinary frequency 04/24/2013  . UTI (urinary tract infection) 04/16/2013   04/16/13 pseudomonas aeruginosa: tx with Cipro 06/15/14 P. Aeruginosa [name] 500mg  IM q12h x 7 days.     . Vaginitis and vulvovaginitis 02/29/2012  . Vertigo 08/26/2015   Past Surgical History:  Procedure Laterality Date  . BREAST SURGERY Bilateral 2005-10/27/2004   lumpectomy- Streck,MD  . CATARACT EXTRACTION  2010   bialteral  . EYE SURGERY     cataract  . FRACTURE SURGERY Left 09/1980   ankle  . HARDWARE REMOVAL Right 05/28/2014   Procedure: HARDWARE REMOVAL;  Surgeon: Melrose Nakayama, MD;  Location: [facility];  Service: Orthopedics;  Laterality: Right;  . HIP PINNING,CANNULATED  03/08/2011   Procedure: CANNULATED HIP PINNING;  Surgeon: Johnn Hai, MD;  Location: [facility];  Service: Orthopedics;  Laterality: Right;  . IR THORACENTESIS ASP PLEURAL SPACE W/IMG GUIDE  08/23/2016  . ORIF HIP FRACTURE Right 03/08/2011   Bean,MD  . SKIN CANCER EXCISION Left 10/12/2012   lower leg Dr. [place]  . SKIN LESION EXCISION Right 02/04/2011   Abdomen lesion spongiotic dermatitis-Taffeen, MD  . SQUAMOUS CELL CARCINOMA EXCISION Right 02/04/2011    forearmSyble Creek, MD  . TONSILLECTOMY    . TOTAL HIP ARTHROPLASTY Right 05/28/2014   Procedure: TOTAL HIP ARTHROPLASTY ANTERIOR APPROACH;  Surgeon: Melrose Nakayama, MD;  Location: [facility];  Service: Orthopedics;  Laterality: Right;    Allergies  Allergen Reactions  . Macrodantin [Nitrofurantoin] Other (See Comments)    Reaction:  Unknown   . Nystatin Swelling    Outpatient Encounter Prescriptions as of 09/27/2016  Medication Sig  . acetaminophen (TYLENOL) 500 MG tablet Take 1,000 mg by mouth every 6 (six) hours as needed for moderate pain.   Marland Kitchen ALPRAZolam (XANAX) 0.5 MG tablet Take 0.5 mg by mouth daily. In addition to scheduled daily dose pt may take 2 x day prn  . anastrozole (ARIMIDEX) 1 MG tablet Take 1 tablet (1 mg total) by mouth daily.  Marland Kitchen antiseptic oral rinse (BIOTENE) LIQD 15 mLs by Mouth Rinse route 2 (two) times daily as needed for dry mouth.   Marland Kitchen aspirin 81 MG chewable tablet Chew 81 mg by mouth daily.   . Bilberry 150 MG CAPS Take 150 mg by mouth daily.  . Calcium Carbonate-Vitamin D (CALCIUM 600+D) 600-400 MG-UNIT tablet Take 1 tablet by mouth 2 (two) times daily.  . Cholecalciferol 1000 units tablet Take 1,000 Units by mouth daily.  . Cranberry 425 MG CAPS Take 1 capsule by mouth daily.  . Diphenhyd-Benzeth-Menth-Zn Ace (CALAGEL MAXIMUM STRENGTH EX) Apply  1 application topically as directed. 2 x day  . furosemide (LASIX) 40 MG tablet Take 1 tablet (40 mg total) by mouth daily. Hold if SBP < 110  . Glucosamine-Chondroitin (GLUCOSAMINE CHONDR COMPLEX PO) Take 2 tablets by mouth daily. for joints  . guaiFENesin (MUCINEX) 600 MG 12 hr tablet Take 600 mg by mouth 2 (two) times daily as needed for cough.  . Lactobacillus Rhamnosus, GG, (CULTURELLE PO) Take 1 tablet by mouth daily.  Marland Kitchen loperamide (IMODIUM A-D) 2 MG tablet Take 4 mg by mouth as needed for diarrhea or loose stools. Give 4 mg by mouth as the initial dose, then 2 mg by mouth after each loose stool.  . magnesium hydroxide  (MILK OF MAGNESIA) 400 MG/5ML suspension Take 30 mLs by mouth as needed for mild constipation.  . meclizine (ANTIVERT) 25 MG tablet Take 1 tablet (25 mg total) by mouth 3 (three) times daily as needed for dizziness.  . Meth-Hyo-M Bl-Na Phos-Ph Sal (URIBEL) 118 MG CAPS Take 1 capsule by mouth 3 (three) times daily as needed.  . mirtazapine (REMERON) 7.5 MG tablet Take 7.5 mg by mouth at bedtime.  . Multiple Vitamins-Minerals (PRESERVISION/LUTEIN) CAPS Take 1 capsule by mouth 2 (two) times daily.  . NON FORMULARY 1 bottle of gatorade as needed at residents request  . Omega-3 Fatty Acids (FISH OIL) 1000 MG CAPS Take 1 capsule by mouth daily.  Marland Kitchen omeprazole (PRILOSEC) 40 MG capsule Take 40 mg by mouth daily.  . ondansetron (ZOFRAN-ODT) 4 MG disintegrating tablet Take 4 mg by mouth daily as needed for nausea or vomiting.   . polycarbophil (FIBERCON) 625 MG tablet Take 625 mg by mouth 2 (two) times daily.  . polyethylene glycol (MIRALAX / GLYCOLAX) packet Take 17 g by mouth at bedtime. Pt also uses once daily as needed for constipation.  . potassium chloride (K-DUR,KLOR-CON) 10 MEQ tablet Take 10 mEq by mouth daily.  . traMADol (ULTRAM) 50 MG tablet Take 50 mg by mouth 4 (four) times daily -  with meals and at bedtime.   Marland Kitchen trimethoprim (TRIMPEX) 100 MG tablet Take 100 mg by mouth at bedtime.  . [DISCONTINUED] Cranberry-Cholecalciferol (SUPER CRANBERRY/VITAMIN D3) 4200-500 MG-UNIT CAPS Take 1 capsule by mouth daily.   No facility-administered encounter medications on file as of 09/27/2016.     Review of Systems  Constitutional: Positive for fatigue. Negative for activity change, appetite change, chills and fever.  HENT: Negative for congestion, rhinorrhea, sinus pain, sinus pressure, sneezing and sore throat.        Dry mouth  Eyes: Negative for pain, discharge, redness and itching.  Respiratory: Negative for cough, chest tightness, shortness of breath and wheezing.        Oxygen via [place]    Cardiovascular: Negative for chest pain, palpitations and leg swelling.  Gastrointestinal: Negative for abdominal distention, abdominal pain, constipation, diarrhea and nausea.  Endocrine: Negative for polydipsia, polyphagia and polyuria.  Genitourinary: Negative for flank pain, frequency and urgency.       Urinary retention does own self catheterization   Musculoskeletal: Positive for gait problem.  Skin: Negative for color change, pallor and rash.  Neurological: Negative for dizziness, seizures, syncope, light-headedness and headaches.  Psychiatric/Behavioral: Negative for agitation, confusion, hallucinations and sleep disturbance. The patient is not nervous/anxious.     Immunization History  Administered Date(s) Administered  . Influenza Whole 10/12/2011, 10/12/2012  . Influenza-Unspecified 10/23/2013, 10/10/2014, 12/12/2015  . PPD Test 07/07/2010, 02/08/2012, 06/29/2014, 03/24/2016  . Pneumococcal Conjugate-13 01/12/2003  . Zoster  01/12/2007   Pertinent  Health Maintenance Due  Topic Date Due  . PNA vac Low Risk Adult (2 of 2 - PPSV23) 01/12/2004  . INFLUENZA VACCINE  08/11/2016  . MAMMOGRAM  08/11/2016  . DEXA SCAN  Completed   Fall Risk  08/25/2016 11/11/2015 10/16/2015 07/03/2015 01/28/2015  Falls in the past year? No Yes No No No  Number falls in past yr: - 1 - - -  Injury with Fall? - Yes - - -  Risk Factor Category  - High Fall Risk - - -  Risk for fall due to : - History of fall(s);Impaired balance/gait;Impaired mobility - - -  Follow up - Falls evaluation completed - - -    Vitals:   09/27/16 1131  BP: 136/70  Pulse: 85  Resp: 18  Temp: 98.8 F (37.1 C)  SpO2: 90%  Weight: 129 lb (58.5 kg)  Height: 5\' 1"  (1.549 m)   Body mass index is 24.37 kg/m. Physical Exam  Constitutional: She is oriented to person, place, and time. She appears well-developed and well-nourished. No distress.  HENT:  Head: Normocephalic.  Right Ear: External ear normal.  Left Ear:  External ear normal.  Mouth/Throat: No oropharyngeal exudate.   Tongue dryness    Eyes: Pupils are equal, round, and reactive to light. Conjunctivae and EOM are normal. Right eye exhibits no discharge. Left eye exhibits no discharge. No scleral icterus.  Neck: Normal range of motion. No JVD present. No thyromegaly present.  Cardiovascular: Normal rate, regular rhythm, normal heart sounds and intact distal pulses.  Exam reveals no gallop and no friction rub.   No murmur heard. Pulmonary/Chest: Effort normal and breath sounds normal. No respiratory distress. She has no wheezes. She has no rales.  Abdominal: Soft. Bowel sounds are normal. She exhibits no distension. There is no tenderness. There is no rebound and no guarding.  Musculoskeletal: She exhibits no tenderness.  Moves x 4 extremities without any difficulties.Unsteady gait uses walker. Bilateral knee high ted hose in place.   Lymphadenopathy:    She has no cervical adenopathy.  Neurological: She is oriented to person, place, and time. Coordination normal.  Skin: Skin is warm and dry. No rash noted. No erythema. No pallor.  No bruises noted   Psychiatric: She has a normal mood and affect.   Labs reviewed:  Recent Labs  01/19/16 0800  02/18/16 1204  05/20/16 1658  07/19/16 1432 08/16/16 1441 09/09/16 09/14/16 1326  NA 134*  < > 129*  < > 134*  < > 132* 131* 130* 130*  K 4.5  < > 4.2  < > 4.1  < > 4.2 4.0 4.4 4.1  CL 98  --  98*  --  100*  --   --   --   --   --   CO2 28  < > 23  < > 28  < > 28 28  --  27  GLUCOSE 107*  < > 108*  < > 111*  < > 128 147*  --  140  BUN 15  < > 13  < > 15  < > 14.1 19.1 9 12.9  CREATININE 0.81  < > 0.71  < > 0.81  < > 1.0 1.0 0.8 0.9  CALCIUM 9.5  < > 8.9  < > 7.6*  < > 8.8 8.4  --  8.3*  < > = values in this interval not displayed.  Recent Labs  07/19/16 1432 08/16/16 1441 09/14/16 1326  AST 65* 79* 63*  ALT 36 41 30  ALKPHOS 257* 300* 300*  BILITOT 0.41 0.57 0.62  PROT 6.0* 5.5* 5.4*    ALBUMIN 3.0* 2.8* 2.6*    Recent Labs  07/19/16 1432  08/16/16 08/16/16 1441 09/14/16 1326  WBC 6.7  < > 9.0 8.0 7.2  NEUTROABS 3.0  --   --  4.5 3.1  HGB 11.2*  < > 11.5* 10.6* 11.1*  HCT 35.6  < > 35* 32.8* 33.6*  MCV 89.4  --   --  90.4 90.8  PLT 214  < > 220 178 216  < > = values in this interval not displayed. Lab Results  Component Value Date   TSH 3.215 08/25/2015   Lab Results  Component Value Date   HGBA1C 5.9 01/23/2015    Significant Diagnostic Results in last 30 days:  No results found.  Assessment/Plan 1. Fatigue, unspecified type Afebrile.No signs or symptoms of urinary tract infections.That could be contributing to her fall.Her fatigue could be chronic from metastatic breast cancer or possible Anemia. Her oxygen saturation readings in the low 90's on oxygen via nasal cannula. Will obtain stat CBC/diff and CMP to rule out infectious and metabolic etiology.    2. Fall at nursing home, initial encounter Sustained unwitnessed fall landing on her right side. No bruises noted. Patient's daughter request U/A and C/S send. Discussed with patient's daughter: patient remains afebrile without any signs of urinary tract infections.She requested a standing order for U/A and C/S whenever patient seems to be confused or sustained a fall.Discussed with patient's POA other etiologies to be rule out instead sending U/A and C/S. I've also discussed patient's POA request with Dr.Pandey who also concur that no need to send urine for U/A and C/S if patient does not have any symptoms of urinary tract infections such as fever,chills, urinary frequency, burning or itching.continue on Bactrim for UTI prophylaxis. Stat CBC/diff and BMP as above.   Family/ staff Communication: Reviewed plan of care with patient, patient's daughter and facility Nurse supervisor.  Labs/tests ordered: Stat CBC/diff and BMP  Dartanyon Frankowski C Estle Sabella, NP

## 2016-09-28 ENCOUNTER — Encounter: Payer: Self-pay | Admitting: *Deleted

## 2016-09-28 DIAGNOSIS — Z9181 History of falling: Secondary | ICD-10-CM | POA: Diagnosis not present

## 2016-09-28 DIAGNOSIS — Z96641 Presence of right artificial hip joint: Secondary | ICD-10-CM | POA: Diagnosis not present

## 2016-09-28 DIAGNOSIS — R2681 Unsteadiness on feet: Secondary | ICD-10-CM | POA: Diagnosis not present

## 2016-09-28 DIAGNOSIS — R29898 Other symptoms and signs involving the musculoskeletal system: Secondary | ICD-10-CM | POA: Diagnosis not present

## 2016-09-28 DIAGNOSIS — R296 Repeated falls: Secondary | ICD-10-CM | POA: Diagnosis not present

## 2016-09-28 DIAGNOSIS — S129XXS Fracture of neck, unspecified, sequela: Secondary | ICD-10-CM | POA: Diagnosis not present

## 2016-09-28 DIAGNOSIS — R1312 Dysphagia, oropharyngeal phase: Secondary | ICD-10-CM | POA: Diagnosis not present

## 2016-09-29 ENCOUNTER — Non-Acute Institutional Stay: Payer: Medicare Other | Admitting: Internal Medicine

## 2016-09-29 ENCOUNTER — Encounter: Payer: Self-pay | Admitting: Internal Medicine

## 2016-09-29 DIAGNOSIS — N3090 Cystitis, unspecified without hematuria: Secondary | ICD-10-CM | POA: Diagnosis not present

## 2016-09-29 DIAGNOSIS — R530 Neoplastic (malignant) related fatigue: Secondary | ICD-10-CM | POA: Diagnosis not present

## 2016-09-29 DIAGNOSIS — R2681 Unsteadiness on feet: Secondary | ICD-10-CM

## 2016-09-29 DIAGNOSIS — R1312 Dysphagia, oropharyngeal phase: Secondary | ICD-10-CM | POA: Diagnosis not present

## 2016-09-29 DIAGNOSIS — Z96641 Presence of right artificial hip joint: Secondary | ICD-10-CM | POA: Diagnosis not present

## 2016-09-29 DIAGNOSIS — Z9181 History of falling: Secondary | ICD-10-CM | POA: Diagnosis not present

## 2016-09-29 DIAGNOSIS — R29898 Other symptoms and signs involving the musculoskeletal system: Secondary | ICD-10-CM | POA: Diagnosis not present

## 2016-09-29 DIAGNOSIS — R296 Repeated falls: Secondary | ICD-10-CM | POA: Diagnosis not present

## 2016-09-29 NOTE — Progress Notes (Signed)
[facility]  Provider: Blanchie Serve MD   Location:  [facility] Room Number: 8 Place of Service:  ALF [phone]) clinic  PCP: Blanchie Serve, MD Patient Care Team: Blanchie Serve, MD as PCP - General (Internal Medicine) Melina Modena, [facility] Shon Hough, MD as Consulting Physician (Ophthalmology) Molli Posey, MD as Consulting Physician (Obstetrics and Gynecology) Gerarda Fraction, MD as Consulting Physician (Ophthalmology) Lorelle Gibbs, MD as Consulting Physician (Radiology) Melrose Nakayama, MD as Consulting Physician (Orthopedic Surgery) Gerarda Fraction, MD as Referring Physician (Ophthalmology) Ardis Hughs, MD as Attending Physician (Urology) Magrinat, Virgie Dad, MD as Consulting Physician (Oncology) Ngetich, Nelda Bucks, NP as Nurse Practitioner [facility] Medicine)  Extended Emergency Contact Information Primary Emergency Contact: Black,Emily Address: [address]          [place], [place] [address] [name] of [place] Phone: [phone] Mobile Phone: [phone] Relation: Daughter Secondary Emergency Contact: Marcine Matar [place] of [place] Mobile Phone: [phone] Relation: Daughter   Goals of Care: Advanced Directive information Advanced Directives 09/29/2016  Does Patient Have a Medical Advance Directive? Yes  Type of Paramedic of [place];Out of facility DNR (pink MOST or yellow form)  Does patient want to make changes to medical advance directive? No - Patient declined  Copy of [place] in Chart? Yes  Would patient like information on creating a medical advance directive? -  Pre-existing out of facility DNR order (yellow form or pink MOST form) Yellow form placed in chart (order not valid for inpatient use);Pink MOST form placed in chart (order not valid for inpatient use)      Chief Complaint  Patient presents with  . Acute Visit    Family  Concerns    HPI: Patient is a 83 y.o. female seen today for acute visit for family concern. Patient had a fall on Sunday during self transfer. This was unwitnessed but per pt she was trying to turn around and fell too fast before she could understand what happened. She denies hitting her head or any LOC. She was placed on neurochecks. No apparent injury. Daughter during this, had concern for UTI because of the fall and wanted a u/a with culture sent. Per nursing report, no dysuria, hematuria, back pain, CVA tenderness, fever or new change of mental state reported. Patient was evaluated next day by my NP with no abnormal clinical finding suggestive of UTI. Of not pt has recurrent uti in past and is on bactrim chronically for supression. She has chronic urinary retention and does self cath bid. She has history of breast cancer with metastases and has abdominal and pelvic ascites.     Past Medical History:  Diagnosis Date  . Abnormal PET scan of mediastinum 11/11/2015  . Abnormalities of the hair 02/29/2012  . Arthritis   . BPV (benign positional vertigo)   . Cancer ([place])   . Cancer antigen 125 (CA 125) elevation 11/27/2013  . Candidiasis of skin and nails 03/19/2011  . 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 Chorioretinal scar, macular 01/23/2015  . Closed fracture of base of neck of femur ([place]) 03/18/2011  . Compression fracture of thoracic vertebra (HCC) 11/24/2012  . Contact dermatitis and other eczema, due to unspecified cause 05/07/2011  . Depression 07/23/2014  . Diverticulosis of colon (without mention of hemorrhage) 03/19/2011  . Edema 05/07/2011  . HOH (hard of hearing)   . Hypertension 02/24/2016  . Hypopotassemia 03/26/2011  . Hyposmolality and/or hyponatremia 03/19/2011  .  Hypotension, unspecified 03/18/2011  . IBS (irritable bowel syndrome) 11/27/2013  . Idiopathic scoliosis 01/27/2016  . Impaired fasting glucose 03/19/2011  . Insomnia, unspecified 03/19/2011  . Kidney infection    . Lumbago 03/19/2011  . Macular degeneration   . Malignant neoplasm of breast (female), unspecified site 10/28/2002  . Mass of right lung 10/23/2015  . Metastasis to adrenal gland ([place]) 11/21/2015   Breast cancer  . Metastatic breast cancer ([place]) 10/27/2004  . Nonexudative age-related macular degeneration 12/10/2010  . Osteoporosis 08/06/2014  . Osteoporosis, unspecified 03/19/2011  . Other malaise and fatigue 03/19/2011  . Pain in joint, pelvic region and thigh 03/19/2011  . Primary osteoarthritis of right hip 05/28/2014   TOTAL HIP ARTHROPLASTY ANTERIOR APPROACH HARDWARE REMOVAL on 05/28/2014   . Purpura senilis ([place]) 07/31/2013  . Radiculopathy, cervical 03/23/2016   Right C6  . Rib fractures   . Right bundle branch block 08/30/2011  . Shortness of breath   . Spasm of muscle 03/19/2011  . Spinal stenosis, lumbar region, with neurogenic claudication 11/02/2011  . TMJ click 5/62/1308  . Trigger finger (acquired) 11/02/2011  . Unspecified constipation 03/19/2011  . Urinary frequency 04/24/2013  . UTI (urinary tract infection) 04/16/2013   04/16/13 pseudomonas aeruginosa: tx with Cipro 06/15/14 P. Aeruginosa [name] 500mg  IM q12h x 7 days.     . Vaginitis and vulvovaginitis 02/29/2012  . Vertigo 08/26/2015   Past Surgical History:  Procedure Laterality Date  . BREAST SURGERY Bilateral 2005-10/27/2004   lumpectomy- Streck,MD  . CATARACT EXTRACTION  2010   bialteral  . EYE SURGERY     cataract  . FRACTURE SURGERY Left 09/1980   ankle  . HARDWARE REMOVAL Right 05/28/2014   Procedure: HARDWARE REMOVAL;  Surgeon: Melrose Nakayama, MD;  Location: [facility];  Service: Orthopedics;  Laterality: Right;  . HIP PINNING,CANNULATED  03/08/2011   Procedure: CANNULATED HIP PINNING;  Surgeon: Johnn Hai, MD;  Location: [facility];  Service: Orthopedics;  Laterality: Right;  . IR THORACENTESIS ASP PLEURAL SPACE W/IMG GUIDE  08/23/2016  . ORIF HIP FRACTURE Right 03/08/2011   Bean,MD  . SKIN CANCER EXCISION  Left 10/12/2012   lower leg Dr. [place]  . SKIN LESION EXCISION Right 02/04/2011   Abdomen lesion spongiotic dermatitis-Taffeen, MD  . SQUAMOUS CELL CARCINOMA EXCISION Right 02/04/2011   forearmSyble Creek, MD  . TONSILLECTOMY    . TOTAL HIP ARTHROPLASTY Right 05/28/2014   Procedure: TOTAL HIP ARTHROPLASTY ANTERIOR APPROACH;  Surgeon: Melrose Nakayama, MD;  Location: [facility];  Service: Orthopedics;  Laterality: Right;    reports that she has never smoked. She has never used smokeless tobacco. She reports that she drinks alcohol. She reports that she does not use drugs. Social History   Social History  . Marital status: Widowed    Spouse name: N/A  . Number of children: N/A  . Years of education: N/A   Occupational History  . homemaker    Social History Main Topics  . Smoking status: Never Smoker  . Smokeless tobacco: Never Used  . Alcohol use 0.0 oz/week     Comment: occasional glass  . Drug use: No  . Sexual activity: No   Other Topics Concern  . Not on file   Social History Narrative   Lives at [facility]   Widowed   Never smoked   Alcohol occasionally   Walks with walker   Exercise none   POA, Living Will, DNR        Functional Status Survey:  Family History  Problem Relation Age of Onset  . Heart disease Mother   . Cancer Father   . Emphysema Brother   . Alzheimer's disease Brother     Health Maintenance  Topic Date Due  . TETANUS/TDAP  10/19/1939  . PNA vac Low Risk Adult (2 of 2 - PPSV23) 01/12/2004  . INFLUENZA VACCINE  08/11/2016  . MAMMOGRAM  08/11/2016  . DEXA SCAN  Completed    Allergies  Allergen Reactions  . Macrodantin [Nitrofurantoin] Other (See Comments)    Reaction:  Unknown   . Nystatin Swelling    Outpatient Encounter Prescriptions as of 09/29/2016  Medication Sig  . acetaminophen (TYLENOL) 500 MG tablet Take 1,000 mg by mouth every 6 (six) hours as needed for moderate pain.   Marland Kitchen ALPRAZolam (XANAX) 0.5 MG tablet Take 0.5 mg  by mouth daily. In addition to scheduled daily dose pt may take 2 x day prn  . anastrozole (ARIMIDEX) 1 MG tablet Take 1 tablet (1 mg total) by mouth daily.  Marland Kitchen antiseptic oral rinse (BIOTENE) LIQD 15 mLs by Mouth Rinse route 2 (two) times daily as needed for dry mouth.   Marland Kitchen aspirin 81 MG chewable tablet Chew 81 mg by mouth daily.   . Bilberry 150 MG CAPS Take 150 mg by mouth daily.  . Calcium Carbonate-Vitamin D (CALCIUM 600+D) 600-400 MG-UNIT tablet Take 1 tablet by mouth 2 (two) times daily.  . Cholecalciferol 1000 units tablet Take 1,000 Units by mouth daily.  . Cranberry 425 MG CAPS Take 1 capsule by mouth daily.  . Diphenhyd-Benzeth-Menth-Zn Ace (CALAGEL MAXIMUM STRENGTH EX) Apply 1 application topically as directed. 2 x day  . furosemide (LASIX) 40 MG tablet Take 1 tablet (40 mg total) by mouth daily. Hold if SBP < 110  . Glucosamine-Chondroitin (GLUCOSAMINE CHONDR COMPLEX PO) Take 2 tablets by mouth daily. for joints  . guaiFENesin (MUCINEX) 600 MG 12 hr tablet Take 600 mg by mouth 2 (two) times daily as needed for cough.  . Lactobacillus Rhamnosus, GG, (CULTURELLE PO) Take 1 tablet by mouth daily.  Marland Kitchen loperamide (IMODIUM A-D) 2 MG tablet Take 4 mg by mouth as needed for diarrhea or loose stools. Give 4 mg by mouth as the initial dose, then 2 mg by mouth after each loose stool.  . magnesium hydroxide (MILK OF MAGNESIA) 400 MG/5ML suspension Take 30 mLs by mouth as needed for mild constipation.  . meclizine (ANTIVERT) 25 MG tablet Take 1 tablet (25 mg total) by mouth 3 (three) times daily as needed for dizziness.  . Meth-Hyo-M Bl-Na Phos-Ph Sal (URIBEL) 118 MG CAPS Take 1 capsule by mouth 3 (three) times daily as needed.  . mirtazapine (REMERON) 7.5 MG tablet Take 7.5 mg by mouth at bedtime.  . Multiple Vitamins-Minerals (PRESERVISION/LUTEIN) CAPS Take 1 capsule by mouth 2 (two) times daily.  . NON FORMULARY 1 bottle of gatorade as needed at residents request  . Omega-3 Fatty Acids (FISH OIL)  1000 MG CAPS Take 1 capsule by mouth daily.  Marland Kitchen omeprazole (PRILOSEC) 40 MG capsule Take 40 mg by mouth daily.  . ondansetron (ZOFRAN-ODT) 4 MG disintegrating tablet Take 4 mg by mouth daily as needed for nausea or vomiting.   . OXYGEN Inhale 2 L into the lungs continuous.  . polycarbophil (FIBERCON) 625 MG tablet Take 625 mg by mouth 2 (two) times daily.  . polyethylene glycol (MIRALAX / GLYCOLAX) packet Take 17 g by mouth at bedtime. Pt also uses once daily as needed for  constipation.  . potassium chloride (K-DUR,KLOR-CON) 10 MEQ tablet Take 10 mEq by mouth daily.  . traMADol (ULTRAM) 50 MG tablet Take 50 mg by mouth 4 (four) times daily -  with meals and at bedtime.   Marland Kitchen trimethoprim (TRIMPEX) 100 MG tablet Take 100 mg by mouth at bedtime.   No facility-administered encounter medications on file as of 09/29/2016.     Review of Systems  Constitutional: Positive for fatigue. Negative for chills and fever.       Has chronic fatigue  HENT: Negative for congestion, mouth sores, rhinorrhea, sinus pressure, sore throat and trouble swallowing.   Respiratory: Positive for shortness of breath. Negative for cough and wheezing.        Shortness of breath with exertion  Cardiovascular: Positive for leg swelling. Negative for chest pain and palpitations.       Has bilateral leg swelling  Gastrointestinal: Positive for nausea. Negative for abdominal pain, constipation, diarrhea and vomiting.       Intermittent nausea for several weeks and zofran helps  Genitourinary: Negative for dysuria, flank pain, hematuria and pelvic pain.       Self irrigates bladder twice a day with catheter. Has not irrigated since last night  Musculoskeletal: Positive for arthralgias and gait problem. Negative for back pain.       Denies leg pain or cramps, uses a collapsable walker  Neurological: Negative for weakness and numbness.  Psychiatric/Behavioral: Negative for behavioral problems and confusion.    Vitals:    09/29/16 1402  BP: 100/66  Pulse: 88  Resp: 20  Temp: 97.7 F (36.5 C)  TempSrc: Oral  SpO2: 90%  Weight: 129 lb (58.5 kg)  Height: 5\' 1"  (1.549 m)   Body mass index is 24.37 kg/m. Physical Exam  Constitutional: She is oriented to person, place, and time. She appears well-developed and well-nourished.  HENT:  Head: Normocephalic and atraumatic.  Mouth/Throat: Oropharynx is clear and moist.  Eyes: Pupils are equal, round, and reactive to light. Conjunctivae are normal.  Neck: Neck supple.  Cardiovascular: Normal rate and regular rhythm.   Pulmonary/Chest: Effort normal. No respiratory distress. She has no wheezes. She has no rales.  Decreased air entry to lung bases  Abdominal: Soft. Bowel sounds are normal. She exhibits distension. There is no tenderness. There is no rebound and no guarding.  Musculoskeletal: She exhibits edema.  Able to move all 4 extremities, unsteady gait, collapsable walker, 1+ pitting edema, skin intact, ted hose present  Lymphadenopathy:    She has no cervical adenopathy.  Neurological: She is alert and oriented to person, place, and time.  Skin: Skin is warm and dry.  Psychiatric: She has a normal mood and affect.     Wt Readings from Last 3 Encounters:  09/29/16 129 lb (58.5 kg)  09/27/16 129 lb (58.5 kg)  09/14/16 128 lb 1.6 oz (58.1 kg)    Labs reviewed: Basic Metabolic Panel:  Recent Labs  01/19/16 0800  02/18/16 1204  05/20/16 1658  07/19/16 1432 08/16/16 1441 09/09/16 09/14/16 1326 09/27/16  NA 134*  < > 129*  < > 134*  < > 132* 131* 130* 130* 127*  K 4.5  < > 4.2  < > 4.1  < > 4.2 4.0 4.4 4.1 4.3  CL 98  --  98*  --  100*  --   --   --   --   --   --   CO2 28  < > 23  < > 28  < >  28 28  --  27  --   GLUCOSE 107*  < > 108*  < > 111*  < > 128 147*  --  140  --   BUN 15  < > 13  < > 15  < > 14.1 19.1 9 12.9 12  CREATININE 0.81  < > 0.71  < > 0.81  < > 1.0 1.0 0.8 0.9 0.8  CALCIUM 9.5  < > 8.9  < > 7.6*  < > 8.8 8.4  --  8.3*  --     < > = values in this interval not displayed. Liver Function Tests:  Recent Labs  07/19/16 1432 08/16/16 1441 09/14/16 1326 09/27/16  AST 65* 79* 63* 46*  ALT 36 41 30 19  ALKPHOS 257* 300* 300* 226*  BILITOT 0.41 0.57 0.62  --   PROT 6.0* 5.5* 5.4*  --   ALBUMIN 3.0* 2.8* 2.6*  --     Recent Labs  05/20/16 1658  LIPASE 26   No results for input(s): AMMONIA in the last 8760 hours. CBC:  Recent Labs  07/19/16 1432  08/16/16 1441 09/14/16 1326 09/27/16  WBC 6.7  < > 8.0 7.2 8.7  NEUTROABS 3.0  --  4.5 3.1  --   HGB 11.2*  < > 10.6* 11.1* 10.5*  HCT 35.6  < > 32.8* 33.6* 32*  MCV 89.4  --  90.4 90.8  --   PLT 214  < > 178 216 209  < > = values in this interval not displayed. Cardiac Enzymes: No results for input(s): CKTOTAL, CKMB, CKMBINDEX, TROPONINI in the last 8760 hours. BNP: Invalid input(s): POCBNP Lab Results  Component Value Date   HGBA1C 5.9 01/23/2015   Lab Results  Component Value Date   TSH 3.215 08/25/2015   No results found for: VITAMINB12 No results found for: FOLATE Lab Results  Component Value Date   FERRITIN 74 08/30/2005    Lipid Panel: No results for input(s): CHOL, HDL, LDLCALC, TRIG, CHOLHDL, LDLDIRECT in the last 8760 hours. Lab Results  Component Value Date   HGBA1C 5.9 01/23/2015    Procedures since last visit: No results found.  Assessment/Plan  Fatigue Chronic. Afebrile, vital signs are stable. Normal wbc, bun and cr. Low Na on review. Has breast cancer with metastases, chronic hyponatremia and anemia all of these could be contributing to her fatigue. No signs of infection on exam.   Unsteady gait With recent fall. Deconditioning with her malignancy and chronic medical problems could be contributing to this. To use her walker all the time. PT has ben working with her. Fall precautions.   Recurrent UTI Patient has recurrent UTI per daughter and is chronically on bactrim for prophylaxis. Daughter today is upset about u/a  with c/s not being sent. She mentions that she had her mother/ pt provide a urine sample yesterday and dropped it at the urologist office. She would like a order placed in patient's chart saying family should be able to provide order for u/a to be sent if they feel their loved ones have UTI. Reviewed with patient and daughter of her clinical symptom- pt denies dysuria, hematuria, increased need for catheterization, CVA tenderness, suprapubic tenderness, rigors or change in mental state. Daughter agrees with this. On further questioning, she mentions that pt was a little flustered and appeared confused on her ride to oncology office. Patient interrupts at this point and says she was worried and flustered about something and soon got herself back and  was able to carry out conversation with the daughter. Daughter agrees. Explained to family that patient with her history of UTI in past, history of chronic urinary retention and self catheterization and being in an assisted living facility is at high risk for colonization and asymptomatic bacteruria. Explained to family that sending u/a with culture, she most likely will have bacterial growth but not necessarily a bacterial infection. If urine culture grows bacteria, she will un-necessarily be placed on antibiotic that she can develop resistance to in future and also be exposed to antibiotic course un-necessarily that can cause more harm than benefit. I have explained that at present, she does not meet clinical criteria per [place] criteria. Daughter understands this, appreciates the explanation and mentions that no one had explained this to her before and all that was told to her was " we cant order it because the doctor said no". On chart review, both charge nurse and NP have had detailed conversation with the daughter and explained reasoning well.   Chronic Hyponatremia Advised on hydration. Monitor bmp with her on furosemide.     Labs/tests ordered:   none   I spent 50 minutes in total face-to-face time with the patient, more than 50% of which was spent in counseling and coordination of care, reviewing test results, reviewing medication and discussing or reviewing the diagnosis with patient and her daughter. The charge nurse was present during the visit.     Blanchie Serve, MD Internal Medicine [facility] [address] [place], [place] [address] Cell Phone (Monday-Friday 8 am - 5 pm): [phone] On Call: [phone] and follow prompts after 5 pm and on weekends Office Phone: [phone] Office Fax: [phone]

## 2016-09-30 DIAGNOSIS — Z9181 History of falling: Secondary | ICD-10-CM | POA: Diagnosis not present

## 2016-09-30 DIAGNOSIS — R1312 Dysphagia, oropharyngeal phase: Secondary | ICD-10-CM | POA: Diagnosis not present

## 2016-09-30 DIAGNOSIS — R29898 Other symptoms and signs involving the musculoskeletal system: Secondary | ICD-10-CM | POA: Diagnosis not present

## 2016-09-30 DIAGNOSIS — R2681 Unsteadiness on feet: Secondary | ICD-10-CM | POA: Diagnosis not present

## 2016-09-30 DIAGNOSIS — R296 Repeated falls: Secondary | ICD-10-CM | POA: Diagnosis not present

## 2016-09-30 DIAGNOSIS — Z96641 Presence of right artificial hip joint: Secondary | ICD-10-CM | POA: Diagnosis not present

## 2016-10-01 DIAGNOSIS — Z96641 Presence of right artificial hip joint: Secondary | ICD-10-CM | POA: Diagnosis not present

## 2016-10-01 DIAGNOSIS — R29898 Other symptoms and signs involving the musculoskeletal system: Secondary | ICD-10-CM | POA: Diagnosis not present

## 2016-10-01 DIAGNOSIS — R296 Repeated falls: Secondary | ICD-10-CM | POA: Diagnosis not present

## 2016-10-01 DIAGNOSIS — Z9181 History of falling: Secondary | ICD-10-CM | POA: Diagnosis not present

## 2016-10-01 DIAGNOSIS — R1312 Dysphagia, oropharyngeal phase: Secondary | ICD-10-CM | POA: Diagnosis not present

## 2016-10-01 DIAGNOSIS — R2681 Unsteadiness on feet: Secondary | ICD-10-CM | POA: Diagnosis not present

## 2016-10-04 ENCOUNTER — Encounter: Payer: Self-pay | Admitting: Internal Medicine

## 2016-10-04 ENCOUNTER — Non-Acute Institutional Stay: Payer: Medicare Other | Admitting: Internal Medicine

## 2016-10-04 DIAGNOSIS — K130 Diseases of lips: Secondary | ICD-10-CM

## 2016-10-04 DIAGNOSIS — Z9181 History of falling: Secondary | ICD-10-CM | POA: Diagnosis not present

## 2016-10-04 DIAGNOSIS — B370 Candidal stomatitis: Secondary | ICD-10-CM

## 2016-10-04 DIAGNOSIS — R1312 Dysphagia, oropharyngeal phase: Secondary | ICD-10-CM | POA: Diagnosis not present

## 2016-10-04 DIAGNOSIS — R2681 Unsteadiness on feet: Secondary | ICD-10-CM | POA: Diagnosis not present

## 2016-10-04 DIAGNOSIS — R682 Dry mouth, unspecified: Secondary | ICD-10-CM

## 2016-10-04 DIAGNOSIS — R29898 Other symptoms and signs involving the musculoskeletal system: Secondary | ICD-10-CM | POA: Diagnosis not present

## 2016-10-04 DIAGNOSIS — R296 Repeated falls: Secondary | ICD-10-CM | POA: Diagnosis not present

## 2016-10-04 DIAGNOSIS — Z96641 Presence of right artificial hip joint: Secondary | ICD-10-CM | POA: Diagnosis not present

## 2016-10-04 NOTE — Progress Notes (Signed)
Location:  [facility] Room Number: 8 Place of Service:  ALF [phone]) Provider:  Blanchie Serve, MD  Blanchie Serve, MD  Patient Care Team: Blanchie Serve, MD as PCP - General (Internal Medicine) Melina Modena, [facility] Shon Hough, MD as Consulting Physician (Ophthalmology) Molli Posey, MD as Consulting Physician (Obstetrics and Gynecology) Gerarda Fraction, MD as Consulting Physician (Ophthalmology) Lorelle Gibbs, MD as Consulting Physician (Radiology) Melrose Nakayama, MD as Consulting Physician (Orthopedic Surgery) Gerarda Fraction, MD as Referring Physician (Ophthalmology) Ardis Hughs, MD as Attending Physician (Urology) Magrinat, Virgie Dad, MD as Consulting Physician (Oncology) Ngetich, Nelda Bucks, NP as Nurse Practitioner (Family Medicine)  Extended Emergency Contact Information Primary Emergency Contact: Black,Emily Address: [address]          [place], [place] [address] [name] of [place] Phone: [phone] Mobile Phone: [phone] Relation: Daughter Secondary Emergency Contact: Marcine Matar [place] of [place] Mobile Phone: [phone] Relation: Daughter  Code Status:  DNR Goals of care: Advanced Directive information Advanced Directives 10/04/2016  Does Patient Have a Medical Advance Directive? Yes  Type of Paramedic of [place];Living will;Out of facility DNR (pink MOST or yellow form)  Does patient want to make changes to medical advance directive? No - Patient declined  Copy of [place] in Chart? Yes  Would patient like information on creating a medical advance directive? -  Pre-existing out of facility DNR order (yellow form or pink MOST form) Yellow form placed in chart (order not valid for inpatient use);Pink MOST form placed in chart (order not valid for inpatient use)     Chief Complaint  Patient presents with  . Acute Visit    White patch on her tongue     HPI:  Pt is a 83 y.o. female seen today for an acute visit for white patch to her tongue noted by nursing 2 days back. She is seen in her room. She complaints of soreness and dry mouth. Denies any open sore areas or bleeding from mouth. Of note, she had oral thrush a month back while on antibiotic and responded well to fluconazole. She denies any fever, sore throat. She has some dysphagia. She is allergic to nystatin.    Past Medical History:  Diagnosis Date  . Abnormal PET scan of mediastinum 11/11/2015  . Abnormalities of the hair 02/29/2012  . Arthritis   . BPV (benign positional vertigo)   . Cancer ([place])   . Cancer antigen 125 (CA 125) elevation 11/27/2013  . Candidiasis of skin and nails 03/19/2011  . 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 Chorioretinal scar, macular 01/23/2015  . Closed fracture of base of neck of femur ([place]) 03/18/2011  . Compression fracture of thoracic vertebra ([facility]) 11/24/2012  . Contact dermatitis and other eczema, due to unspecified cause 05/07/2011  . Depression 07/23/2014  . Diverticulosis of colon (without mention of hemorrhage) 03/19/2011  . Edema 05/07/2011  . HOH (hard of hearing)   . Hypertension 02/24/2016  . Hypopotassemia 03/26/2011  . Hyposmolality and/or hyponatremia 03/19/2011  . Hypotension, unspecified 03/18/2011  . IBS (irritable bowel syndrome) 11/27/2013  . Idiopathic scoliosis 01/27/2016  . Impaired fasting glucose 03/19/2011  . Insomnia, unspecified 03/19/2011  . Kidney infection   . Lumbago 03/19/2011  . Macular degeneration   . Malignant neoplasm of breast (female), unspecified site 10/28/2002  . Mass of right lung 10/23/2015  . Metastasis to adrenal gland ([place]) 11/21/2015   Breast cancer  . Metastatic breast cancer ([place]) 10/27/2004  .  Nonexudative age-related macular degeneration 12/10/2010  . Osteoporosis 08/06/2014  . Osteoporosis, unspecified 03/19/2011  . Other malaise and fatigue 03/19/2011  . Pain in joint, pelvic  region and thigh 03/19/2011  . Primary osteoarthritis of right hip 05/28/2014   TOTAL HIP ARTHROPLASTY ANTERIOR APPROACH HARDWARE REMOVAL on 05/28/2014   . Purpura senilis ([place]) 07/31/2013  . Radiculopathy, cervical 03/23/2016   Right C6  . Rib fractures   . Right bundle branch block 08/30/2011  . Shortness of breath   . Spasm of muscle 03/19/2011  . Spinal stenosis, lumbar region, with neurogenic claudication 11/02/2011  . TMJ click 7/42/5956  . Trigger finger (acquired) 11/02/2011  . Unspecified constipation 03/19/2011  . Urinary frequency 04/24/2013  . UTI (urinary tract infection) 04/16/2013   04/16/13 pseudomonas aeruginosa: tx with Cipro 06/15/14 P. Aeruginosa [name] 500mg  IM q12h x 7 days.     . Vaginitis and vulvovaginitis 02/29/2012  . Vertigo 08/26/2015   Past Surgical History:  Procedure Laterality Date  . BREAST SURGERY Bilateral 2005-10/27/2004   lumpectomy- Streck,MD  . CATARACT EXTRACTION  2010   bialteral  . EYE SURGERY     cataract  . FRACTURE SURGERY Left 09/1980   ankle  . HARDWARE REMOVAL Right 05/28/2014   Procedure: HARDWARE REMOVAL;  Surgeon: Melrose Nakayama, MD;  Location: [facility];  Service: Orthopedics;  Laterality: Right;  . HIP PINNING,CANNULATED  03/08/2011   Procedure: CANNULATED HIP PINNING;  Surgeon: Johnn Hai, MD;  Location: [facility];  Service: Orthopedics;  Laterality: Right;  . IR THORACENTESIS ASP PLEURAL SPACE W/IMG GUIDE  08/23/2016  . ORIF HIP FRACTURE Right 03/08/2011   Bean,MD  . SKIN CANCER EXCISION Left 10/12/2012   lower leg Dr. [place]  . SKIN LESION EXCISION Right 02/04/2011   Abdomen lesion spongiotic dermatitis-Taffeen, MD  . SQUAMOUS CELL CARCINOMA EXCISION Right 02/04/2011   forearmSyble Creek, MD  . TONSILLECTOMY    . TOTAL HIP ARTHROPLASTY Right 05/28/2014   Procedure: TOTAL HIP ARTHROPLASTY ANTERIOR APPROACH;  Surgeon: Melrose Nakayama, MD;  Location: [facility];  Service: Orthopedics;  Laterality: Right;    Allergies  Allergen Reactions  .  Macrodantin [Nitrofurantoin] Other (See Comments)    Reaction:  Unknown   . Nystatin Swelling    Outpatient Encounter Prescriptions as of 10/04/2016  Medication Sig  . acetaminophen (TYLENOL) 500 MG tablet Take 1,000 mg by mouth every 6 (six) hours as needed for moderate pain.   Marland Kitchen ALPRAZolam (XANAX) 0.5 MG tablet Take 0.5 mg by mouth daily. In addition to scheduled daily dose pt may take 2 x day prn  . anastrozole (ARIMIDEX) 1 MG tablet Take 1 tablet (1 mg total) by mouth daily.  Marland Kitchen antiseptic oral rinse (BIOTENE) LIQD 15 mLs by Mouth Rinse route 2 (two) times daily as needed for dry mouth.   Marland Kitchen aspirin 81 MG chewable tablet Chew 81 mg by mouth daily.   . Bilberry 150 MG CAPS Take 150 mg by mouth daily.  . Calcium Carbonate-Vitamin D (CALCIUM 600+D) 600-400 MG-UNIT tablet Take 1 tablet by mouth 2 (two) times daily.  . Cholecalciferol 1000 units tablet Take 1,000 Units by mouth daily.  . Cranberry 425 MG CAPS Take 1 capsule by mouth daily.  . Diphenhyd-Benzeth-Menth-Zn Ace (CALAGEL MAXIMUM STRENGTH EX) Apply 1 application topically as directed. 2 x day  . furosemide (LASIX) 40 MG tablet Take 1 tablet (40 mg total) by mouth daily. Hold if SBP < 110  . Glucosamine-Chondroitin (GLUCOSAMINE CHONDR COMPLEX PO) Take 2 tablets by mouth  daily. for joints  . guaiFENesin (MUCINEX) 600 MG 12 hr tablet Take 600 mg by mouth 2 (two) times daily as needed for cough.  . Lactobacillus Rhamnosus, GG, (CULTURELLE PO) Take 1 tablet by mouth daily.  Marland Kitchen loperamide (IMODIUM A-D) 2 MG tablet Take 4 mg by mouth as needed for diarrhea or loose stools. Give 4 mg by mouth as the initial dose, then 2 mg by mouth after each loose stool.  . magnesium hydroxide (MILK OF MAGNESIA) 400 MG/5ML suspension Take 30 mLs by mouth as needed for mild constipation.  . meclizine (ANTIVERT) 25 MG tablet Take 1 tablet (25 mg total) by mouth 3 (three) times daily as needed for dizziness.  . Meth-Hyo-M Bl-Na Phos-Ph Sal (URIBEL) 118 MG CAPS  Take 1 capsule by mouth 3 (three) times daily as needed.  . mirtazapine (REMERON) 7.5 MG tablet Take 7.5 mg by mouth at bedtime.  . Multiple Vitamins-Minerals (PRESERVISION/LUTEIN) CAPS Take 1 capsule by mouth 2 (two) times daily.  . NON FORMULARY 1 bottle of gatorade as needed at residents request  . Omega-3 Fatty Acids (FISH OIL) 1000 MG CAPS Take 1 capsule by mouth daily.  Marland Kitchen omeprazole (PRILOSEC) 40 MG capsule Take 40 mg by mouth daily.  . ondansetron (ZOFRAN-ODT) 4 MG disintegrating tablet Take 4 mg by mouth daily as needed for nausea or vomiting.   . OXYGEN Inhale 2 L into the lungs continuous.  . polycarbophil (FIBERCON) 625 MG tablet Take 625 mg by mouth 2 (two) times daily.  . polyethylene glycol (MIRALAX / GLYCOLAX) packet Take 17 g by mouth at bedtime. Pt also uses once daily as needed for constipation.  . potassium chloride (K-DUR,KLOR-CON) 10 MEQ tablet Take 10 mEq by mouth daily.  . traMADol (ULTRAM) 50 MG tablet Take 50 mg by mouth 4 (four) times daily -  with meals and at bedtime.   Marland Kitchen trimethoprim (TRIMPEX) 100 MG tablet Take 100 mg by mouth at bedtime.   No facility-administered encounter medications on file as of 10/04/2016.     Review of Systems  Constitutional: Negative for appetite change, chills, fatigue and fever.  HENT: Negative for dental problem, mouth sores, sore throat and trouble swallowing.   Respiratory: Negative for choking.   Cardiovascular: Negative for chest pain.  Gastrointestinal: Negative for abdominal pain, diarrhea, nausea and vomiting.  Skin: Negative for rash.    Immunization History  Administered Date(s) Administered  . Influenza Whole 10/12/2011, 10/12/2012  . Influenza-Unspecified 10/23/2013, 10/10/2014, 12/12/2015  . PPD Test 07/07/2010, 02/08/2012, 06/29/2014, 03/24/2016  . Pneumococcal Conjugate-13 01/12/2003  . Zoster 01/12/2007   Pertinent  Health Maintenance Due  Topic Date Due  . MAMMOGRAM  08/11/2016  . INFLUENZA VACCINE   10/11/2016 (Originally 08/11/2016)  . PNA vac Low Risk Adult (2 of 2 - PPSV23) 11/12/2016 (Originally 01/12/2004)  . DEXA SCAN  Completed   Fall Risk  08/25/2016 11/11/2015 10/16/2015 07/03/2015 01/28/2015  Falls in the past year? No Yes No No No  Number falls in past yr: - 1 - - -  Injury with Fall? - Yes - - -  Risk Factor Category  - High Fall Risk - - -  Risk for fall due to : - History of fall(s);Impaired balance/gait;Impaired mobility - - -  Follow up - Falls evaluation completed - - -   Functional Status Survey:    Vitals:   10/04/16 0954  BP: (!) 150/72  Pulse: 88  Resp: 20  Temp: 98.1 F (36.7 C)  TempSrc: Oral  SpO2: 90%  Weight: 129 lb (58.5 kg)  Height: 5\' 1"  (1.549 m)   Body mass index is 24.37 kg/m. Physical Exam  Constitutional: She appears well-developed and well-nourished. No distress.  HENT:  Head: Normocephalic and atraumatic.  Mouth/Throat: No oropharyngeal exudate.  Dry mouth with beefy red tongue, no open sores to buccal mucosa, small area to her tongue with white coating, angular cheliosis  Eyes: Right eye exhibits no discharge. Left eye exhibits no discharge.  Neck: Neck supple.  Cardiovascular: Normal rate and regular rhythm.   Pulmonary/Chest: Effort normal and breath sounds normal.  Abdominal: Soft. Bowel sounds are normal.  Musculoskeletal: She exhibits edema.  Neurological: She is alert.  Skin: Skin is warm and dry. She is not diaphoretic.  Psychiatric: She has a normal mood and affect.    Labs reviewed:  Recent Labs  01/19/16 0800  02/18/16 1204  05/20/16 1658  07/19/16 1432 08/16/16 1441 09/09/16 09/14/16 1326 09/27/16  NA 134*  < > 129*  < > 134*  < > 132* 131* 130* 130* 127*  K 4.5  < > 4.2  < > 4.1  < > 4.2 4.0 4.4 4.1 4.3  CL 98  --  98*  --  100*  --   --   --   --   --   --   CO2 28  < > 23  < > 28  < > 28 28  --  27  --   GLUCOSE 107*  < > 108*  < > 111*  < > 128 147*  --  140  --   BUN 15  < > 13  < > 15  < > 14.1 19.1 9 12.9  12  CREATININE 0.81  < > 0.71  < > 0.81  < > 1.0 1.0 0.8 0.9 0.8  CALCIUM 9.5  < > 8.9  < > 7.6*  < > 8.8 8.4  --  8.3*  --   < > = values in this interval not displayed.  Recent Labs  07/19/16 1432 08/16/16 1441 09/14/16 1326 09/27/16  AST 65* 79* 63* 46*  ALT 36 41 30 19  ALKPHOS 257* 300* 300* 226*  BILITOT 0.41 0.57 0.62  --   PROT 6.0* 5.5* 5.4*  --   ALBUMIN 3.0* 2.8* 2.6*  --     Recent Labs  07/19/16 1432  08/16/16 1441 09/14/16 1326 09/27/16  WBC 6.7  < > 8.0 7.2 8.7  NEUTROABS 3.0  --  4.5 3.1  --   HGB 11.2*  < > 10.6* 11.1* 10.5*  HCT 35.6  < > 32.8* 33.6* 32*  MCV 89.4  --  90.4 90.8  --   PLT 214  < > 178 216 209  < > = values in this interval not displayed. Lab Results  Component Value Date   TSH 3.215 08/25/2015   Lab Results  Component Value Date   HGBA1C 5.9 01/23/2015   No results found for: CHOL, HDL, LDLCALC, LDLDIRECT, TRIG, CHOLHDL  Significant Diagnostic Results in last 30 days:  No results found.  Assessment/Plan  Oral thrush Immuno supressed with metastatic breast cancer and being on chronic antibiotic for recurrent UTI. Oral care in morning and after every meal. Diflucan 100 mg po daily x 3 days.   Angular cheilitis Likely from dry mouth and candida infection. She is immuno supressed. Check G40, folic acid and iron level to rule out dietary deficiencies. Hydration encouraged.   Dry mouth Change  Biotene mouth wash every 6 hours as needed. Hydration to be encouraged   Family/ staff Communication: reviewed care plan with patient and charge nurse.   Labs/tests ordered: I33, folic acid and iron  Blanchie Serve, MD Internal Medicine [facility] [address] [place], [place] [address] Cell Phone (Monday-Friday 8 am - 5 pm): [phone] On Call: [phone] and follow prompts after 5 pm and on weekends Office Phone: [phone] Office Fax: [phone]

## 2016-10-05 DIAGNOSIS — R29898 Other symptoms and signs involving the musculoskeletal system: Secondary | ICD-10-CM | POA: Diagnosis not present

## 2016-10-05 DIAGNOSIS — R296 Repeated falls: Secondary | ICD-10-CM | POA: Diagnosis not present

## 2016-10-05 DIAGNOSIS — R1312 Dysphagia, oropharyngeal phase: Secondary | ICD-10-CM | POA: Diagnosis not present

## 2016-10-05 DIAGNOSIS — Z96641 Presence of right artificial hip joint: Secondary | ICD-10-CM | POA: Diagnosis not present

## 2016-10-05 DIAGNOSIS — Z9181 History of falling: Secondary | ICD-10-CM | POA: Diagnosis not present

## 2016-10-05 DIAGNOSIS — R2681 Unsteadiness on feet: Secondary | ICD-10-CM | POA: Diagnosis not present

## 2016-10-06 DIAGNOSIS — R2681 Unsteadiness on feet: Secondary | ICD-10-CM | POA: Diagnosis not present

## 2016-10-06 DIAGNOSIS — R29898 Other symptoms and signs involving the musculoskeletal system: Secondary | ICD-10-CM | POA: Diagnosis not present

## 2016-10-06 DIAGNOSIS — R1312 Dysphagia, oropharyngeal phase: Secondary | ICD-10-CM | POA: Diagnosis not present

## 2016-10-06 DIAGNOSIS — Z9181 History of falling: Secondary | ICD-10-CM | POA: Diagnosis not present

## 2016-10-06 DIAGNOSIS — R296 Repeated falls: Secondary | ICD-10-CM | POA: Diagnosis not present

## 2016-10-06 DIAGNOSIS — Z96641 Presence of right artificial hip joint: Secondary | ICD-10-CM | POA: Diagnosis not present

## 2016-10-07 DIAGNOSIS — R2681 Unsteadiness on feet: Secondary | ICD-10-CM | POA: Diagnosis not present

## 2016-10-07 DIAGNOSIS — D649 Anemia, unspecified: Secondary | ICD-10-CM | POA: Diagnosis not present

## 2016-10-07 DIAGNOSIS — E538 Deficiency of other specified B group vitamins: Secondary | ICD-10-CM | POA: Diagnosis not present

## 2016-10-07 DIAGNOSIS — Z96641 Presence of right artificial hip joint: Secondary | ICD-10-CM | POA: Diagnosis not present

## 2016-10-07 DIAGNOSIS — R296 Repeated falls: Secondary | ICD-10-CM | POA: Diagnosis not present

## 2016-10-07 DIAGNOSIS — D638 Anemia in other chronic diseases classified elsewhere: Secondary | ICD-10-CM | POA: Diagnosis not present

## 2016-10-07 DIAGNOSIS — R1312 Dysphagia, oropharyngeal phase: Secondary | ICD-10-CM | POA: Diagnosis not present

## 2016-10-07 DIAGNOSIS — E871 Hypo-osmolality and hyponatremia: Secondary | ICD-10-CM | POA: Diagnosis not present

## 2016-10-07 DIAGNOSIS — Z9181 History of falling: Secondary | ICD-10-CM | POA: Diagnosis not present

## 2016-10-07 DIAGNOSIS — R29898 Other symptoms and signs involving the musculoskeletal system: Secondary | ICD-10-CM | POA: Diagnosis not present

## 2016-10-07 LAB — COMPLETE METABOLIC PANEL WITH GFR
ALT: 48
AST: 18
Albumin: 2.8
Alkaline Phosphatase: 232
BILIRUBIN TOTAL: 0.9
BUN: 13 (ref 4–21)
CALCIUM: 7.6
CO2: 19
Chloride: 94
Creat: 0.8
GLUCOSE: 82
Potassium: 4.6
Sodium: 128
TOTAL PROTEIN: 5.2 g/dL

## 2016-10-07 LAB — VITAMIN B12

## 2016-10-07 LAB — IRON: IRON: 48

## 2016-10-07 LAB — FOLATE: FOLATE: 4

## 2016-10-08 ENCOUNTER — Encounter: Payer: Self-pay | Admitting: Family

## 2016-10-08 ENCOUNTER — Telehealth: Payer: Self-pay

## 2016-10-08 ENCOUNTER — Encounter: Payer: Self-pay | Admitting: *Deleted

## 2016-10-08 ENCOUNTER — Non-Acute Institutional Stay: Payer: Medicare Other | Admitting: Family

## 2016-10-08 DIAGNOSIS — E871 Hypo-osmolality and hyponatremia: Secondary | ICD-10-CM | POA: Diagnosis not present

## 2016-10-08 DIAGNOSIS — R2681 Unsteadiness on feet: Secondary | ICD-10-CM | POA: Diagnosis not present

## 2016-10-08 DIAGNOSIS — W19XXXA Unspecified fall, initial encounter: Secondary | ICD-10-CM | POA: Diagnosis not present

## 2016-10-08 DIAGNOSIS — R630 Anorexia: Secondary | ICD-10-CM

## 2016-10-08 DIAGNOSIS — E440 Moderate protein-calorie malnutrition: Secondary | ICD-10-CM | POA: Diagnosis not present

## 2016-10-08 DIAGNOSIS — R531 Weakness: Secondary | ICD-10-CM

## 2016-10-08 DIAGNOSIS — Y92129 Unspecified place in nursing home as the place of occurrence of the external cause: Secondary | ICD-10-CM | POA: Diagnosis not present

## 2016-10-08 DIAGNOSIS — C50919 Malignant neoplasm of unspecified site of unspecified female breast: Secondary | ICD-10-CM

## 2016-10-08 NOTE — Telephone Encounter (Signed)
Cassandra Glenn called to inform Dr Jana Hakim that pt is significantly weaker.  She has fallen 3 time in last 10 days, 2 of these in the last 36 hours.   Sept 12 Cassandra Glenn stated pt was "not quite as with it". Pt fell sept 16 then did blood work and saw low sodium. Appetite is off.  Urinalysis was neg. She has been falling in the room at [facility]. Pt is new c/o pain in her low back, mild pain, with in the last 10 days that she has noticed it.  NP at [facility] will be visiting pt today.  S/w Mendel Ryder and called Cassandra Glenn back. There will be a CXR on Monday that was noted in f/u and disposition on 9/4 VO notes . Wait on [facility] NP visit information. If pt worsens over weekend can go to ED. We will see pt on Monday.

## 2016-10-10 NOTE — Progress Notes (Signed)
Location:  [facility] Room Number: 8 Place of Service:  ALF [phone]) Provider: Khrystal Jeanmarie FNP-C  Blanchie Serve, MD  Patient Care Team: Blanchie Serve, MD as PCP - General (Internal Medicine) Melina Modena, [facility] Shon Hough, MD as Consulting Physician (Ophthalmology) Molli Posey, MD as Consulting Physician (Obstetrics and Gynecology) Gerarda Fraction, MD as Consulting Physician (Ophthalmology) Lorelle Gibbs, MD as Consulting Physician (Radiology) Melrose Nakayama, MD as Consulting Physician (Orthopedic Surgery) Gerarda Fraction, MD as Referring Physician (Ophthalmology) Ardis Hughs, MD as Attending Physician (Urology) Magrinat, Virgie Dad, MD as Consulting Physician (Oncology) Hartman Minahan, Nelda Bucks, NP as Nurse Practitioner (Family Medicine)  Extended Emergency Contact Information Primary Emergency Contact: Black,Emily Address: [address]          [place], [place] [address] [name] of [place] Phone: [phone] Mobile Phone: [phone] Relation: Daughter Secondary Emergency Contact: Marcine Matar [place] of [place] Mobile Phone: [phone] Relation: Daughter  Code Status:  DNR Goals of care: Advanced Directive information Advanced Directives 10/08/2016  Does Patient Have a Medical Advance Directive? Yes  Type of Advance Directive Out of facility DNR (pink MOST or yellow form);Living will;Healthcare Power of Attorney  Does patient want to make changes to medical advance directive? -  Copy of [place] in Chart? Yes  Would patient like information on creating a medical advance directive? -  Pre-existing out of facility DNR order (yellow form or pink MOST form) Yellow form placed in chart (order not valid for inpatient use)     Chief Complaint  Patient presents with  . Acute Visit    Fall    HPI:  Pt is a 83 y.o. female seen today at [facility] for an acute visit for evaluation of fall  X 2 episodes.She is seen in her room today with facility Nurse supervisor present.Facility Nurse reports patient had x 2 fall episode, increased confusion and requires more assistance.she also drinks smalls amounts of liquids and has poor appetite. " I just don't have any appetite for food".She was found lying in front of her recliner states was trying to pick up something.she also fell in the bathroom trying trying to in and Out cath her self but states legs were weak thus just went down on the floor.She denies hitting head on the floor or any acute pain.Also denies any fever, chills, cough or urinary tract infection symptoms.Recent lab results showed Na 128 Previous was 127, Ca 7.6, TP 5.2,Alb 2.8, iron 48, Vit B12 > 2000, Folate 4.0 ( 10/07/2016).she is currently working with Physical and occupation Therapy but states has no energy.Of note she has history of breast cancer with metastases and has abdominal and pelvic ascites.she follow up with oncology.Currently on anastrozole 1 mg tablet daily.Transfer to higher level of care in skilled Nursing facility recommended due to physical deconditioning and high risk for falls but per facility Nurse supervisor patient's daughter has requested for PT/OT and transfer to be held for now. She states considering possible hospice service but would like to talk with patient's Oncologist on 10/11/2016 prior to making final decision.       Past Medical History:  Diagnosis Date  . Abnormal PET scan of mediastinum 11/11/2015  . Abnormalities of the hair 02/29/2012  . Arthritis   . BPV (benign positional vertigo)   . Cancer ([place])   . Cancer antigen 125 (CA 125) elevation 11/27/2013  . Candidiasis of skin and nails 03/19/2011  . 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 Chorioretinal  scar, macular 01/23/2015  . Closed fracture of base of neck of femur ([place]) 03/18/2011  . Compression fracture of thoracic vertebra ([facility]) 11/24/2012  . Contact dermatitis and other eczema, due to  unspecified cause 05/07/2011  . Depression 07/23/2014  . Diverticulosis of colon (without mention of hemorrhage) 03/19/2011  . Edema 05/07/2011  . HOH (hard of hearing)   . Hypertension 02/24/2016  . Hypopotassemia 03/26/2011  . Hyposmolality and/or hyponatremia 03/19/2011  . Hypotension, unspecified 03/18/2011  . IBS (irritable bowel syndrome) 11/27/2013  . Idiopathic scoliosis 01/27/2016  . Impaired fasting glucose 03/19/2011  . Insomnia, unspecified 03/19/2011  . Kidney infection   . Lumbago 03/19/2011  . Macular degeneration   . Malignant neoplasm of breast (female), unspecified site 10/28/2002  . Mass of right lung 10/23/2015  . Metastasis to adrenal gland ([place]) 11/21/2015   Breast cancer  . Metastatic breast cancer ([place]) 10/27/2004  . Nonexudative age-related macular degeneration 12/10/2010  . Osteoporosis 08/06/2014  . Osteoporosis, unspecified 03/19/2011  . Other malaise and fatigue 03/19/2011  . Pain in joint, pelvic region and thigh 03/19/2011  . Primary osteoarthritis of right hip 05/28/2014   TOTAL HIP ARTHROPLASTY ANTERIOR APPROACH HARDWARE REMOVAL on 05/28/2014   . Purpura senilis ([place]) 07/31/2013  . Radiculopathy, cervical 03/23/2016   Right C6  . Rib fractures   . Right bundle branch block 08/30/2011  . Shortness of breath   . Spasm of muscle 03/19/2011  . Spinal stenosis, lumbar region, with neurogenic claudication 11/02/2011  . TMJ click 2/59/5638  . Trigger finger (acquired) 11/02/2011  . Unspecified constipation 03/19/2011  . Urinary frequency 04/24/2013  . UTI (urinary tract infection) 04/16/2013   04/16/13 pseudomonas aeruginosa: tx with Cipro 06/15/14 P. Aeruginosa Fortaz 567m IM q12h x 7 days.     . Vaginitis and vulvovaginitis 02/29/2012  . Vertigo 08/26/2015   Past Surgical History:  Procedure Laterality Date  . BREAST SURGERY Bilateral 2005-10/27/2004   lumpectomy- Streck,MD  . CATARACT EXTRACTION  2010   bialteral  . EYE SURGERY     cataract  .  FRACTURE SURGERY Left 09/1980   ankle  . HARDWARE REMOVAL Right 05/28/2014   Procedure: HARDWARE REMOVAL;  Surgeon: PMelrose Nakayama MD;  [facility]  Service: Orthopedics;  Laterality: Right;  . HIP PINNING,CANNULATED  03/08/2011   Procedure: CANNULATED HIP PINNING;  Surgeon: JJohnn Hai MD;  Location: [facility];  Service: Orthopedics;  Laterality: Right;  . IR THORACENTESIS ASP PLEURAL SPACE W/IMG GUIDE  08/23/2016  . ORIF HIP FRACTURE Right 03/08/2011   Bean,MD  . SKIN CANCER EXCISION Left 10/12/2012   lower leg Dr. [place] . SKIN LESION EXCISION Right 02/04/2011   Abdomen lesion spongiotic dermatitis-Taffeen, MD  . SQUAMOUS CELL CARCINOMA EXCISION Right 02/04/2011   forearm-[place] MD  . TONSILLECTOMY    . TOTAL HIP ARTHROPLASTY Right 05/28/2014   Procedure: TOTAL HIP ARTHROPLASTY ANTERIOR APPROACH;  Surgeon: PMelrose Nakayama MD;  [facility]  Service: Orthopedics;  Laterality: Right;    Allergies  Allergen Reactions  . Macrodantin [Nitrofurantoin] Other (See Comments)    Reaction:  Unknown   . Nystatin Swelling    Outpatient Encounter Prescriptions as of 10/08/2016  Medication Sig  . acetaminophen (TYLENOL) 500 MG tablet Take 1,000 mg by mouth every 6 (six) hours as needed for moderate pain.   .Marland KitchenALPRAZolam (XANAX) 0.5 MG tablet Take 0.5 mg by mouth daily. In addition to scheduled daily dose pt may take 2 x day prn  . anastrozole (  ARIMIDEX) 1 MG tablet Take 1 tablet (1 mg total) by mouth daily.  Marland Kitchen antiseptic oral rinse (BIOTENE) LIQD 15 mLs by Mouth Rinse route 2 (two) times daily as needed for dry mouth.   Marland Kitchen aspirin 81 MG chewable tablet Chew 81 mg by mouth daily.   . Bilberry 150 MG CAPS Take 150 mg by mouth daily.  . Calcium Carbonate-Vitamin D (CALCIUM 600+D) 600-400 MG-UNIT tablet Take 1 tablet by mouth 2 (two) times daily.  . Cholecalciferol 1000 units tablet Take 1,000 Units by mouth daily.  . Cranberry 425 MG CAPS Take 1 capsule by mouth daily.  .  Diphenhyd-Benzeth-Menth-Zn Ace (CALAGEL MAXIMUM STRENGTH EX) Apply 1 application topically as directed. 2 x day  . furosemide (LASIX) 40 MG tablet Take 1 tablet (40 mg total) by mouth daily. Hold if SBP < 110  . Glucosamine-Chondroitin (GLUCOSAMINE CHONDR COMPLEX PO) Take 2 tablets by mouth daily. for joints  . guaiFENesin (MUCINEX) 600 MG 12 hr tablet Take 600 mg by mouth 2 (two) times daily as needed for cough.  . Lactobacillus Rhamnosus, GG, (CULTURELLE PO) Take 1 tablet by mouth daily.  Marland Kitchen loperamide (IMODIUM A-D) 2 MG tablet Take 4 mg by mouth as needed for diarrhea or loose stools. Give 4 mg by mouth as the initial dose, then 2 mg by mouth after each loose stool.  . magnesium hydroxide (MILK OF MAGNESIA) 400 MG/5ML suspension Take 30 mLs by mouth as needed for mild constipation.  . meclizine (ANTIVERT) 25 MG tablet Take 1 tablet (25 mg total) by mouth 3 (three) times daily as needed for dizziness.  . Meth-Hyo-M Bl-Na Phos-Ph Sal (URIBEL) 118 MG CAPS Take 1 capsule by mouth 3 (three) times daily as needed.  . mirtazapine (REMERON) 7.5 MG tablet Take 7.5 mg by mouth at bedtime.  . Multiple Vitamins-Minerals (PRESERVISION/LUTEIN) CAPS Take 1 capsule by mouth 2 (two) times daily.  . NON FORMULARY 1 bottle of gatorade as needed at residents request  . Omega-3 Fatty Acids (FISH OIL) 1000 MG CAPS Take 1 capsule by mouth daily.  Marland Kitchen omeprazole (PRILOSEC) 40 MG capsule Take 40 mg by mouth daily.  . ondansetron (ZOFRAN-ODT) 4 MG disintegrating tablet Take 4 mg by mouth daily as needed for nausea or vomiting.   . OXYGEN Inhale 2 L into the lungs continuous.  . polycarbophil (FIBERCON) 625 MG tablet Take 625 mg by mouth 2 (two) times daily.  . polyethylene glycol (MIRALAX / GLYCOLAX) packet Take 17 g by mouth at bedtime. Pt also uses once daily as needed for constipation.  . potassium chloride (K-DUR,KLOR-CON) 10 MEQ tablet Take 10 mEq by mouth daily.  . traMADol (ULTRAM) 50 MG tablet Take 50 mg by mouth 4  (four) times daily -  with meals and at bedtime.   Marland Kitchen trimethoprim (TRIMPEX) 100 MG tablet Take 100 mg by mouth at bedtime.   No facility-administered encounter medications on file as of 10/08/2016.     Review of Systems  Constitutional: Positive for appetite change and fatigue. Negative for chills and fever.  HENT: Positive for hearing loss. Negative for congestion, rhinorrhea, sinus pain, sinus pressure, sneezing, sore throat and trouble swallowing.   Eyes: Negative for pain, discharge, redness and itching.  Respiratory: Negative for cough, chest tightness, shortness of breath and wheezing.   Cardiovascular: Positive for leg swelling. Negative for chest pain and palpitations.  Gastrointestinal: Negative for abdominal distention, abdominal pain, constipation, diarrhea and vomiting.       Chronic nausea on zofran as  needed   Endocrine: Negative for cold intolerance, heat intolerance, polydipsia, polyphagia and polyuria.  Genitourinary: Negative for flank pain and urgency.       Urinary retension does own self in and out cath twice daily  Musculoskeletal: Positive for arthralgias and gait problem.  Skin: Negative for color change, pallor, rash and wound.  Neurological: Negative for dizziness, seizures, syncope, light-headedness and headaches.       Generalized weakness   Psychiatric/Behavioral: Negative for agitation, hallucinations and sleep disturbance. The patient is not nervous/anxious.     Immunization History  Administered Date(s) Administered  . Influenza Whole 10/12/2011, 10/12/2012  . Influenza-Unspecified 10/23/2013, 10/10/2014, 12/12/2015  . PPD Test 07/07/2010, 02/08/2012, 06/29/2014, 03/24/2016  . Pneumococcal Conjugate-13 01/12/2003  . Zoster 01/12/2007   Pertinent  Health Maintenance Due  Topic Date Due  . MAMMOGRAM  08/11/2016  . INFLUENZA VACCINE  10/11/2016 (Originally 08/11/2016)  . PNA vac Low Risk Adult (2 of 2 - PPSV23) 12/01/2016 (Originally 01/12/2004)  . DEXA  SCAN  Completed   Fall Risk  08/25/2016 11/11/2015 10/16/2015 07/03/2015 01/28/2015  Falls in the past year? No Yes No No No  Number falls in past yr: - 1 - - -  Injury with Fall? - Yes - - -  Risk Factor Category  - High Fall Risk - - -  Risk for fall due to : - History of fall(s);Impaired balance/gait;Impaired mobility - - -  Follow up - Falls evaluation completed - - -    Vitals:   10/08/16 1259  BP: 128/68  Pulse: 85  Resp: 18  Temp: (!) 97 F (36.1 C)  SpO2: 97%  Weight: 129 lb (58.5 kg)  Height: 5' 1"  (1.549 m)   Body mass index is 24.37 kg/m. Physical Exam  Constitutional: She is oriented to person, place, and time. She appears well-developed and well-nourished.  Elderly in no acute distress  HENT:  Head: Normocephalic.  Right Ear: External ear normal.  Left Ear: External ear normal.  Mouth/Throat: Oropharynx is clear and moist. No oropharyngeal exudate.  Hearing Aids in place  Eyes: Pupils are equal, round, and reactive to light. Conjunctivae and EOM are normal. Right eye exhibits no discharge. Left eye exhibits no discharge. No scleral icterus.  Neck: Normal range of motion. No JVD present. No thyromegaly present.  Cardiovascular: Normal rate, regular rhythm and intact distal pulses.  Exam reveals no gallop and no friction rub.   No murmur heard. Pulmonary/Chest: Effort normal and breath sounds normal. No respiratory distress. She has no wheezes. She has no rales.  Abdominal: Soft. Bowel sounds are normal. She exhibits no distension. There is no tenderness. There is no rebound and no guarding.  Musculoskeletal: Normal range of motion. She exhibits no tenderness.  Unsteady gait uses Walker. Bilateral lower extremities 1+ edema. Ted hose not in place  Lymphadenopathy:    She has no cervical adenopathy.  Neurological: She is oriented to person, place, and time. Coordination normal.  Skin: Skin is warm and dry. No rash noted. No erythema. No pallor.  Left upper arm and  elbow purple bruise non-tender to touch.Completes active and passive ROM without any difficulties.   Psychiatric: She has a normal mood and affect.   Labs reviewed:  Recent Labs  02/18/16 1204  05/20/16 1658  07/19/16 1432 08/16/16 1441  09/14/16 1326 09/27/16 10/07/16  NA 129*  < > 134*  < > 132* 131*  < > 130* 127* 128  K 4.2  < > 4.1  < >  4.2 4.0  < > 4.1 4.3 4.6  CL 98*  --  100*  --   --   --   --   --   --  94  CO2 23  < > 28  < > 28 28  --  27  --  19  GLUCOSE 108*  < > 111*  < > 128 147*  --  140  --   --   BUN 13  < > 15  < > 14.1 19.1  < > 12.9 12 13   CREATININE 0.71  < > 0.81  < > 1.0 1.0  < > 0.9 0.8 0.8  CALCIUM 8.9  < > 7.6*  < > 8.8 8.4  --  8.3*  --  7.6  < > = values in this interval not displayed.  Recent Labs  08/16/16 1441 09/14/16 1326 09/27/16 10/07/16  AST 79* 63* 46* 18  ALT 41 30 19 48  ALKPHOS 300* 300* 226* 232  BILITOT 0.57 0.62  --  0.9  PROT 5.5* 5.4*  --  5.2  ALBUMIN 2.8* 2.6*  --  2.8    Recent Labs  07/19/16 1432  08/16/16 1441 09/14/16 1326 09/27/16  WBC 6.7  < > 8.0 7.2 8.7  NEUTROABS 3.0  --  4.5 3.1  --   HGB 11.2*  < > 10.6* 11.1* 10.5*  HCT 35.6  < > 32.8* 33.6* 32*  MCV 89.4  --  90.4 90.8  --   PLT 214  < > 178 216 209  < > = values in this interval not displayed. Lab Results  Component Value Date   TSH 3.215 08/25/2015   Lab Results  Component Value Date   HGBA1C 5.9 01/23/2015   Significant Diagnostic Results in last 30 days:  No results found.  Assessment/Plan 1. Generalized weakness Progressive weakness despite working with PT/OT.Recent WBC normal and Hgb stable.Her decreased oral intake and met cancer could be contributing to decline.Transfer to higher level of care in skilled Nursing facility recommended due to physical deconditioning and high risk for falls but per facility Nurse supervisor patient's daughter has requested for PT/OT and transfer to be held for now.She states considering possible hospice  service but would like to talk with patient's Oncologist on 10/11/2016 prior to making final decision.continue to assist with ADL's.encourage hydration.   2. Unsteady gait Progressive decline as above. Had x 2 fall episode 10/07/2016.continue Fall and safety precautions.   3. Chronic hyponatremia Recent lab result 128 previous 127. Continue to monitor BMP.   4. Decreased appetite Drinks smalls amounts of liquid but not eating solid food.Continue to encourage protein supplements.continue on Remeron 7.5 mg tablet at bedtime.Recommended increase in Remeron dosage but patient's daughter considering hospice service but will notify facility after talking with patient's oncologist.continue to monitor.      5. Moderate protein-calorie malnutrition TP 5.2,Alb 2.8 ( 10/07/2016). Possible due to poor oral intake. Continue to encourage protein supplement. Follow up with facility Nutritionist.Monitor BMP.    6. Hypocalcemia  Ca 7.6 poor oral intake could be a contributory factor.Continue on calcium supplements. Monitor BMP.   Family/ staff Communication: Reviewed plan of care with patient and facility Nurse supervisor  Labs/tests ordered: None   Sandrea Hughs, NP

## 2016-10-11 ENCOUNTER — Ambulatory Visit: Payer: Medicare Other

## 2016-10-11 ENCOUNTER — Ambulatory Visit (HOSPITAL_COMMUNITY)
Admission: RE | Admit: 2016-10-11 | Discharge: 2016-10-11 | Disposition: A | Discharge status: Home / Self Care | Payer: Medicare Other | Source: Ambulatory Visit | Attending: Oncology | Admitting: Oncology

## 2016-10-11 ENCOUNTER — Encounter: Payer: Self-pay | Admitting: Adult Health

## 2016-10-11 ENCOUNTER — Other Ambulatory Visit (HOSPITAL_BASED_OUTPATIENT_CLINIC_OR_DEPARTMENT_OTHER): Payer: Medicare Other

## 2016-10-11 ENCOUNTER — Telehealth: Payer: Self-pay

## 2016-10-11 ENCOUNTER — Ambulatory Visit (HOSPITAL_BASED_OUTPATIENT_CLINIC_OR_DEPARTMENT_OTHER): Payer: Medicare Other | Admitting: Adult Health

## 2016-10-11 VITALS — BP 118/47 | HR 84 | Temp 98.2°F | Resp 18 | Ht 61.0 in

## 2016-10-11 DIAGNOSIS — C7951 Secondary malignant neoplasm of bone: Secondary | ICD-10-CM

## 2016-10-11 DIAGNOSIS — C7972 Secondary malignant neoplasm of left adrenal gland: Secondary | ICD-10-CM

## 2016-10-11 DIAGNOSIS — C50911 Malignant neoplasm of unspecified site of right female breast: Secondary | ICD-10-CM

## 2016-10-11 DIAGNOSIS — Z170 Estrogen receptor positive status [ER+]: Secondary | ICD-10-CM

## 2016-10-11 DIAGNOSIS — G893 Neoplasm related pain (acute) (chronic): Secondary | ICD-10-CM

## 2016-10-11 DIAGNOSIS — J90 Pleural effusion, not elsewhere classified: Secondary | ICD-10-CM | POA: Insufficient documentation

## 2016-10-11 DIAGNOSIS — C50919 Malignant neoplasm of unspecified site of unspecified female breast: Secondary | ICD-10-CM | POA: Diagnosis not present

## 2016-10-11 DIAGNOSIS — C7971 Secondary malignant neoplasm of right adrenal gland: Secondary | ICD-10-CM

## 2016-10-11 DIAGNOSIS — C7970 Secondary malignant neoplasm of unspecified adrenal gland: Secondary | ICD-10-CM

## 2016-10-11 DIAGNOSIS — Z79811 Long term (current) use of aromatase inhibitors: Secondary | ICD-10-CM | POA: Diagnosis not present

## 2016-10-11 DIAGNOSIS — R0602 Shortness of breath: Secondary | ICD-10-CM | POA: Diagnosis not present

## 2016-10-11 LAB — CBC WITH DIFFERENTIAL/PLATELET
BASO%: 0.8 % (ref 0.0–2.0)
BASOS ABS: 0.1 10*3/uL (ref 0.0–0.1)
EOS%: 2.2 % (ref 0.0–7.0)
Eosinophils Absolute: 0.2 10*3/uL (ref 0.0–0.5)
HEMATOCRIT: 35.6 % (ref 34.8–46.6)
HGB: 11.8 g/dL (ref 11.6–15.9)
LYMPH%: 20.7 % (ref 14.0–49.7)
MCH: 30.4 pg (ref 25.1–34.0)
MCHC: 33.1 g/dL (ref 31.5–36.0)
MCV: 91.9 fL (ref 79.5–101.0)
MONO#: 1.1 10*3/uL — ABNORMAL HIGH (ref 0.1–0.9)
MONO%: 13.2 % (ref 0.0–14.0)
NEUT#: 5.3 10*3/uL (ref 1.5–6.5)
NEUT%: 63.1 % (ref 38.4–76.8)
PLATELETS: 209 10*3/uL (ref 145–400)
RBC: 3.87 10*6/uL (ref 3.70–5.45)
RDW: 17.1 % — AB (ref 11.2–14.5)
WBC: 8.4 10*3/uL (ref 3.9–10.3)
lymph#: 1.7 10*3/uL (ref 0.9–3.3)

## 2016-10-11 LAB — COMPREHENSIVE METABOLIC PANEL
ALT: 21 U/L (ref 0–55)
ANION GAP: 7 meq/L (ref 3–11)
AST: 54 U/L — ABNORMAL HIGH (ref 5–34)
Albumin: 2.4 g/dL — ABNORMAL LOW (ref 3.5–5.0)
Alkaline Phosphatase: 255 U/L — ABNORMAL HIGH (ref 40–150)
BUN: 12.3 mg/dL (ref 7.0–26.0)
CALCIUM: 8 mg/dL — AB (ref 8.4–10.4)
CHLORIDE: 93 meq/L — AB (ref 98–109)
CO2: 23 mEq/L (ref 22–29)
Creatinine: 0.8 mg/dL (ref 0.6–1.1)
EGFR: 64 mL/min/{1.73_m2} — ABNORMAL LOW (ref >90)
Glucose: 121 mg/dl (ref 70–140)
POTASSIUM: 4.8 meq/L (ref 3.5–5.1)
Sodium: 123 mEq/L — ABNORMAL LOW (ref 136–145)
Total Bilirubin: 1.07 mg/dL (ref 0.20–1.20)
Total Protein: 5.3 g/dL — ABNORMAL LOW (ref 6.4–8.3)

## 2016-10-11 NOTE — Progress Notes (Signed)
[place]  [phone] [phone]) [phone]     ID: Cassandra Glenn DOB: [birth year]  MR#: [id number]  RKY#:[id number]  Patient Care Team: Blanchie Serve, MD as PCP - General (Internal Medicine) Melina Modena, [facility] Shon Hough, MD as Consulting Physician (Ophthalmology) Molli Posey, MD as Consulting Physician (Obstetrics and Gynecology) Gerarda Fraction, MD as Consulting Physician (Ophthalmology) Lorelle Gibbs, MD as Consulting Physician (Radiology) Melrose Nakayama, MD as Consulting Physician (Orthopedic Surgery) Gerarda Fraction, MD as Referring Physician (Ophthalmology) Ardis Hughs, MD as Attending Physician (Urology) Magrinat, Virgie Dad, MD as Consulting Physician (Oncology) Ngetich, Nelda Bucks, NP as Nurse Practitioner (Family Medicine) Scot Dock, NP OTHER MD:  CHIEF COMPLAINT: 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  She has had a steady decline over the past two weeks to two and a half weeks.  She lives in assisted living at [facility]  She has had three falls in the past two weeks.  She got weak and fell.  It is unknown if she lost consciousness.  She was found on the floor by staff all three times.  She was evaluated by both Dinah Ngetich and Dr. Bubba Camp.  There was no testing done on the heart or the brain other than physical exam.  She did have some blood testing done on Friday following her most recent fall, however I don't have those results.    She has required increased assistance with her ADLs, pretty much total assist.  She has required self catheterization due to incomplete bladder emptying for about 2 years, however since this past Sunday she has not been able to self cath.  She tells me that because she couldn't pee.  She has a  foley placed and initially they got 325cc out, then overnight she had 450cc.  She has minimal output in there today.    She has a right pleural effusion.  She did undergo thoracentesis for this on August 23, 2016.  It is worse now than prior.  Repeat chest xray today, shows an increase in this fluid.  Her stamina is decreased, and she has increased dyspnea on exertion.    She is having persistent back pain.  The pain is over her left lateral upper back.  This pain is intermittent.  There isn't anything that makes it better that she is aware of.    Intake is down, both with eating and drinking.  She is noted to cough a lot after drinking water.    REVIEW OF SYSTEMS: She says her last bm was yesterday.  Her abdomen is distended and she denies any pain in her abdomen.  Otherwise see HPI for ROS.  Detailed ros otherwise non contributory.    BREAST CANCER HISTORY: From the original intake note:  I saw Cassandra Glenn approximately approximately 13 years ago when she was diagnosed with right-sided invasive breast cancer, which was treated with radiation and tamoxifen for 5 years. She also had a noninvasive breast cancer removed from the left breast at the same time.  More recently Cassandra Glenn had a chest x-ray 10/22/2015 for evaluation of a cough. The film showed a variety of incidental findings including atherosclerosis of the thoracic aorta and an old lower thoracic compression fracture. There was also however a right apical density which required further evaluation. Accordingly on 10/27/2015 she had a chest CT  scan which found chronic areas of scarring in both lungs, not significantly changed from November 2015. However right middle lobe pleural thickening appeared worse, and there was increased nodular pleural thickening along the azygo-esophageal recess, suggestive of an indolent mesothelioma. There was a small cluster of nodules at the medial right lung base which was new from the prior study. There were no bony  metastases but there were multiple chronic compression deformities and degenerative change.   Given these results, a PET scan was obtained 11/07/2015. There was a hypermetabolic right paratracheal lymph node measuring 1.2 cm, but no additional mediastinal adenopathy. Along the pleural surface of the azygo-esophageal region there was increased metabolic activity with an SUV max of 20.9. There was also intense metabolic activity associated with a thickened left adrenal gland, with an SUV max of 10.8. There was some hypermetabolic activity associated with the right adrenal gland. There were again no bony metastatic disease findings.  With this information the patient returns for further evaluation and treatment.   PAST MEDICAL HISTORY: Past Medical History:  Diagnosis Date  . Abnormal PET scan of mediastinum 11/11/2015  . Abnormalities of the hair 02/29/2012  . Arthritis   . BPV (benign positional vertigo)   . Cancer ([place])   . Cancer antigen 125 (CA 125) elevation 11/27/2013  . Candidiasis of skin and nails 03/19/2011  . 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 Chorioretinal scar, macular 01/23/2015  . Closed fracture of base of neck of femur ([place]) 03/18/2011  . Compression fracture of thoracic vertebra ([facility]) 11/24/2012  . Contact dermatitis and other eczema, due to unspecified cause 05/07/2011  . Depression 07/23/2014  . Diverticulosis of colon (without mention of hemorrhage) 03/19/2011  . Edema 05/07/2011  . HOH (hard of hearing)   . Hypertension 02/24/2016  . Hypopotassemia 03/26/2011  . Hyposmolality and/or hyponatremia 03/19/2011  . Hypotension, unspecified 03/18/2011  . IBS (irritable bowel syndrome) 11/27/2013  . Idiopathic scoliosis 01/27/2016  . Impaired fasting glucose 03/19/2011  . Insomnia, unspecified 03/19/2011  . Kidney infection   . Lumbago 03/19/2011  . Macular degeneration   . Malignant neoplasm of breast (female), unspecified site 10/28/2002  . Mass of right lung  10/23/2015  . Metastasis to adrenal gland ([place]) 11/21/2015   Breast cancer  . Metastatic breast cancer ([place]) 10/27/2004  . Nonexudative age-related macular degeneration 12/10/2010  . Osteoporosis 08/06/2014  . Osteoporosis, unspecified 03/19/2011  . Other malaise and fatigue 03/19/2011  . Pain in joint, pelvic region and thigh 03/19/2011  . Primary osteoarthritis of right hip 05/28/2014   TOTAL HIP ARTHROPLASTY ANTERIOR APPROACH HARDWARE REMOVAL on 05/28/2014   . Purpura senilis ([place]) 07/31/2013  . Radiculopathy, cervical 03/23/2016   Right C6  . Rib fractures   . Right bundle branch block 08/30/2011  . Shortness of breath   . Spasm of muscle 03/19/2011  . Spinal stenosis, lumbar region, with neurogenic claudication 11/02/2011  . TMJ click 0/34/7425  . Trigger finger (acquired) 11/02/2011  . Unspecified constipation 03/19/2011  . Urinary frequency 04/24/2013  . UTI (urinary tract infection) 04/16/2013   04/16/13 pseudomonas aeruginosa: tx with Cipro 06/15/14 P. Aeruginosa Fortaz 578m IM q12h x 7 days.     . Vaginitis and vulvovaginitis 02/29/2012  . Vertigo 08/26/2015    PAST SURGICAL HISTORY: Past Surgical History:  Procedure Laterality Date  . BREAST SURGERY Bilateral 2005-10/27/2004   lumpectomy- Streck,MD  . CATARACT EXTRACTION  2010   bialteral  . EYE SURGERY     cataract  . FRACTURE SURGERY Left  09/1980   ankle  . HARDWARE REMOVAL Right 05/28/2014   Procedure: HARDWARE REMOVAL;  Surgeon: Melrose Nakayama, MD;  Location: [place];  Service: Orthopedics;  Laterality: Right;  . HIP PINNING,CANNULATED  03/08/2011   Procedure: CANNULATED HIP PINNING;  Surgeon: Johnn Hai, MD;  Location: [facility];  Service: Orthopedics;  Laterality: Right;  . IR THORACENTESIS ASP PLEURAL SPACE W/IMG GUIDE  08/23/2016  . ORIF HIP FRACTURE Right 03/08/2011   Bean,MD  . SKIN CANCER EXCISION Left 10/12/2012   lower leg Dr. [place]  . SKIN LESION EXCISION Right 02/04/2011   Abdomen lesion spongiotic  dermatitis-Taffeen, MD  . SQUAMOUS CELL CARCINOMA EXCISION Right 02/04/2011   forearmSyble Creek, MD  . TONSILLECTOMY    . TOTAL HIP ARTHROPLASTY Right 05/28/2014   Procedure: TOTAL HIP ARTHROPLASTY ANTERIOR APPROACH;  Surgeon: Melrose Nakayama, MD;  Location: [place];  Service: Orthopedics;  Laterality: Right;    FAMILY HISTORY Family History  Problem Relation Age of Onset  . Heart disease Mother   . Cancer Father   . Emphysema Brother   . Alzheimer's disease Brother   The patient's father died at the age of 68 with colon cancer. The patient's mother died at the age of 48 from heart disease the patient had 2 brothers, no sisters. Both brothers have died, one from emphysema and one from dementia.  GYNECOLOGIC HISTORY:  No LMP recorded. Patient is postmenopausal. Menarche age 16, first live birth age 84, the patient is Cassandra Glenn P2. She stopped having periods around age 17. She never took hormone replacement.  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 She is widowed. She lives in independent living section of friend[facility] [place]. Her daughter Mardene Celeste lives in [place] (actually [place]) where she teaches. Daughter [name] lives in [place] and is Web designer at [employer]. The patient has 5 grandchildren and 2 step grandchildren. She has one great grandchild and one on the way. She attends [employer]    ADVANCED DIRECTIVES: Cassandra Glenn has named both her daughters as joint health care power of attorney. She tells me she has a living will "somewhere in the house" and has a DO NOT RESUSCITATE order written at [facility].   HEALTH MAINTENANCE: Social History  Substance Use Topics  . Smoking status: Never Smoker  . Smokeless tobacco: Never Used  . Alcohol use 0.0 oz/week     Comment: occasional glass     Colonoscopy:  PAP:  Bone density:   Allergies  Allergen Reactions  . Macrodantin  [Nitrofurantoin] Other (See Comments)    Reaction:  Unknown   . Nystatin Swelling    Current Outpatient Prescriptions  Medication Sig Dispense Refill  . acetaminophen (TYLENOL) 500 MG tablet Take 1,000 mg by mouth every 6 (six) hours as needed for moderate pain.     Marland Kitchen ALPRAZolam (XANAX) 0.5 MG tablet Take 0.5 mg by mouth daily. In addition to scheduled daily dose pt may take 2 x day prn    . anastrozole (ARIMIDEX) 1 MG tablet Take 1 tablet (1 mg total) by mouth daily. 90 tablet 4  . antiseptic oral rinse (BIOTENE) LIQD 15 mLs by Mouth Rinse route 2 (two) times daily as needed for dry mouth.     Marland Kitchen aspirin 81 MG chewable tablet Chew 81 mg by mouth daily.     . Bilberry 150 MG CAPS Take 150 mg by mouth daily.    . Calcium Carbonate-Vitamin  D (CALCIUM 600+D) 600-400 MG-UNIT tablet Take 1 tablet by mouth 2 (two) times daily.    . Cholecalciferol 1000 units tablet Take 1,000 Units by mouth daily.    . Cranberry 425 MG CAPS Take 1 capsule by mouth daily.    . Diphenhyd-Benzeth-Menth-Zn Ace (CALAGEL MAXIMUM STRENGTH EX) Apply 1 application topically as directed. 2 x day    . furosemide (LASIX) 40 MG tablet Take 1 tablet (40 mg total) by mouth daily. Hold if SBP < 110 30 tablet   . Glucosamine-Chondroitin (GLUCOSAMINE CHONDR COMPLEX PO) Take 2 tablets by mouth daily. for joints    . guaiFENesin (MUCINEX) 600 MG 12 hr tablet Take 600 mg by mouth 2 (two) times daily as needed for cough.    . Lactobacillus Rhamnosus, GG, (CULTURELLE PO) Take 1 tablet by mouth daily.    Marland Kitchen loperamide (IMODIUM A-D) 2 MG tablet Take 4 mg by mouth as needed for diarrhea or loose stools. Give 4 mg by mouth as the initial dose, then 2 mg by mouth after each loose stool.    . magnesium hydroxide (MILK OF MAGNESIA) 400 MG/5ML suspension Take 30 mLs by mouth as needed for mild constipation.    . meclizine (ANTIVERT) 25 MG tablet Take 1 tablet (25 mg total) by mouth 3 (three) times daily as needed for dizziness. 30 tablet 0  .  Meth-Hyo-M Bl-Na Phos-Ph Sal (URIBEL) 118 MG CAPS Take 1 capsule by mouth 3 (three) times daily as needed.    . mirtazapine (REMERON) 7.5 MG tablet Take 7.5 mg by mouth at bedtime.    . Multiple Vitamins-Minerals (PRESERVISION/LUTEIN) CAPS Take 1 capsule by mouth 2 (two) times daily.    . NON FORMULARY 1 bottle of gatorade as needed at residents request    . Omega-3 Fatty Acids (FISH OIL) 1000 MG CAPS Take 1 capsule by mouth daily.    Marland Kitchen omeprazole (PRILOSEC) 40 MG capsule Take 40 mg by mouth daily.    . ondansetron (ZOFRAN-ODT) 4 MG disintegrating tablet Take 4 mg by mouth daily as needed for nausea or vomiting.     . OXYGEN Inhale 2 L into the lungs continuous.    . polycarbophil (FIBERCON) 625 MG tablet Take 625 mg by mouth 2 (two) times daily.    . polyethylene glycol (MIRALAX / GLYCOLAX) packet Take 17 g by mouth at bedtime. Pt also uses once daily as needed for constipation.    . potassium chloride (K-DUR,KLOR-CON) 10 MEQ tablet Take 10 mEq by mouth daily.    . traMADol (ULTRAM) 50 MG tablet Take 50 mg by mouth 4 (four) times daily -  with meals and at bedtime.     Marland Kitchen trimethoprim (TRIMPEX) 100 MG tablet Take 100 mg by mouth at bedtime.     No current facility-administered medications for this visit.     OBJECTIVE: older White woman Who appears stated age  34:   10/11/16 1459  BP: (!) 118/47  Pulse: 84  Resp: 18  Temp: 98.2 F (36.8 C)  SpO2: 97%     There is no height or weight on file to calculate BMI.    ECOG FS:2 - Symptomatic, <50% confined to bed  GENERAL: Patient is an older woman in wheelchair, appears distressed HEENT:  Sclerae anicteric.  Oropharynx dry. No ulcerations or evidence of oropharyngeal candidiasis. Neck is supple.  NODES:  No cervical, supraclavicular, or axillary lymphadenopathy palpated.  LUNGS:  Decreased air movement.  Diminished in bases HEART:  Regular rate and rhythm.  No murmur appreciated. ABDOMEN:  Distended, normoactive bowel sounds, nontender.  No organomegaly palpated. MSK:  No focal spinal tenderness to palpation.  EXTREMITIES:  AE hose on SKIN:  Clear with no obvious rashes or skin changes. ecchymosis to left shoulder after recent fall.   NEURO:  Nonfocal. Appropriate affect.     LAB RESULTS:  CMP     Component Value Date/Time   NA 123 (L) 10/11/2016 1442   K 4.8 10/11/2016 1442   CL 94 10/07/2016   CO2 23 10/11/2016 1442   GLUCOSE 121 10/11/2016 1442   BUN 12.3 10/11/2016 1442   CREATININE 0.8 10/11/2016 1442   CALCIUM 8.0 (L) 10/11/2016 1442   PROT 5.3 (L) 10/11/2016 1442   ALBUMIN 2.4 (L) 10/11/2016 1442   AST 54 (H) 10/11/2016 1442   ALT 21 10/11/2016 1442   ALKPHOS 255 (H) 10/11/2016 1442   BILITOT 1.07 10/11/2016 1442   GFRNONAA >60 05/20/2016 1658   GFRNONAA 63 01/19/2016 0800   GFRAA >60 05/20/2016 1658   GFRAA 72 01/19/2016 0800    INo results found for: SPEP, UPEP  Lab Results  Component Value Date   WBC 8.4 10/11/2016   NEUTROABS 5.3 10/11/2016   HGB 11.8 10/11/2016   HCT 35.6 10/11/2016   MCV 91.9 10/11/2016   PLT 209 10/11/2016      Chemistry      Component Value Date/Time   NA 123 (L) 10/11/2016 1442   K 4.8 10/11/2016 1442   CL 94 10/07/2016   CO2 23 10/11/2016 1442   BUN 12.3 10/11/2016 1442   CREATININE 0.8 10/11/2016 1442   GLU 146 09/27/2016      Component Value Date/Time   CALCIUM 8.0 (L) 10/11/2016 1442   ALKPHOS 255 (H) 10/11/2016 1442   AST 54 (H) 10/11/2016 1442   ALT 21 10/11/2016 1442   BILITOT 1.07 10/11/2016 1442       Lab Results  Component Value Date   LABCA2 19 11/07/2007    No components found for: MGQQP619  No results for input(s): INR in the last 168 hours.  Urinalysis    Component Value Date/Time   COLORURINE YELLOW 05/20/2016 1635   APPEARANCEUR HAZY (A) 05/20/2016 1635   LABSPEC 1.013 05/20/2016 1635   LABSPEC 1.015 03/11/2016 1307   PHURINE 6.0 05/20/2016 1635   GLUCOSEU NEGATIVE 05/20/2016 1635   GLUCOSEU Negative 03/11/2016 1307     HGBUR NEGATIVE 05/20/2016 1635   BILIRUBINUR NEGATIVE 05/20/2016 1635   BILIRUBINUR Negative 03/11/2016 1307   KETONESUR NEGATIVE 05/20/2016 1635   PROTEINUR NEGATIVE 05/20/2016 1635   UROBILINOGEN 0.2 03/11/2016 1307   NITRITE NEGATIVE 05/20/2016 1635   LEUKOCYTESUR SMALL (A) 05/20/2016 1635   LEUKOCYTESUR Color Interference 03/11/2016 1307     STUDIES: Dg Chest 2 View  Result Date: 10/11/2016 CLINICAL DATA:  Breast cancer possible lung Mets short of breath EXAM: CHEST  2 VIEW COMPARISON:  08/23/2016, PET-CT 08/09/2016, thoracic spine radiograph 03/11/2016 FINDINGS: Moderate right pleural effusion and trace left pleural effusion. Atelectasis or infiltrate at the right lung base. Stable cardiomediastinal silhouette with aortic atherosclerosis. Postsurgical changes at the right axilla. Old appearing bilateral rib deformities. Scarring at the left lung apex. Stable moderate severe compression of lower thoracic vertebra. IMPRESSION: 1. Moderate right pleural effusion, increased compared to prior with worsened atelectasis or consolidation at the right lung base. 2. Trace left pleural effusion Electronically Signed   By: Donavan Foil M.D.   On: 10/11/2016 14:49    ELIGIBLE FOR AVAILABLE RESEARCH  PROTOCOL: no  ASSESSMENT: 72 y.o. [place] woman residing at [facility] [place]  (1) status post right lumpectomy on 10/27/2004 for an invasive lobular carcinoma, grade 1, estrogen and progesterone receptor positive, Ki-67 3%, HER-2 negative, and so T2N2, treated with radiation and tamoxifen for 5 years  (a) Also status post left lumpectomy for ductal carcinoma in situ  METASTATIC DISEASE: November 2017 (2) chest CT scan and PET scan obtained October 2017 show a hot 1.2 cm right paratracheal lymph node, a rim of activity along the pleural surface of the a zygo-esophageal region, and bilateral adrenal metastases, left greater than right   (a) biopsy of the left adrenal gland 12/03/2015 shows  metastatic carcinoma, estrogen and progesterone receptor positive, HER-2 nonamplified  (b) CA-27-29 is informative  (3) tamoxifen started 01/20/2015, discontinued March 2018 with concerns regarding progression  (4) anastrozole started March 2018  (a) Xgeva started 03/29/2016, to be repeated monthly, held after one dose because of hypocalcemia, resumed 06/21/2016  (5) fulvestrant added 09/02/2016  PLAN: I reviewed Danyale's declining overall functional status, with Dr. Lindi Adie in detail, including her lab work. We met with Davetta and her daughter and reviewed the following.  She will stay on Anastrozole, however will not receive Xgeva or Fulvestrant today.  She will need restaging studies and these have been set up for tomorrow at 1pm.  She will receive 268m IV fluids with NS today while in clinic.  [facility].  If it can be arranged, the working plan will be 500cc NS weekly.  Once we get this going, we will make decisions about her level of care, and the potential for hospice involvement.  She is a DNR and her nursing home has this paperwork.  We will await the restaging studies and to hear from her nursing home, hopefully that will be worked out by tomorrow afternoon.  They know to call for any questions or concerns.    A total of (60) minutes of face-to-face time was spent with this patient with greater than 50% of that time in counseling and care-coordination.   LGardenia Phlegm NP  10/11/16 3:30 PM Medical Oncology and Hematology [facility] [id number]. [phone]   Fax. [phone] Attending Note  I personally saw and examined MHannan TetzlaffAllred. The plan of care was discussed with her. I agree with the assessment and plan as documented above. We spent extensive amount of time discussing different options. Patient has been  getting progressively weaker to the point that she is requiring full-time assistance from the nursing home staff. I discussed with the patient that this is a sign of progression of disease. She was not very convinced and would like to have scans done. I did discuss with her that doing scans may not necessarily change what we would do subsequently. Because of patient appeared dehydrated, we give her 250 mL of normal saline in the clinic. I recommended that she consider hospice care.  Signed GRulon Eisenmenger MD

## 2016-10-11 NOTE — Progress Notes (Signed)
Pt discharged post completion IV fluid.  Discharge instructions including written CT appointment schedule and instructions as well as 2 bottles of contrast.  Pt discharged with daughter in wheelchair.

## 2016-10-11 NOTE — Telephone Encounter (Signed)
Pt has had a significant decline. [facility] is now assisting her to the bathroom, and all walking. They have put a catheter in in place of her doing self cath. She did not leave her room at all yesterday. She is barely eating or drinking. Cassandra Glenn is needing help in making decisions, about admitting her to hospital, or health care level at [facility]. Does she go forward with treatment? What is the benefit of treatment?   Cassandra Glenn is wanting Cassandra Glenn to be aware when she comes in today at 3pm.

## 2016-10-12 ENCOUNTER — Encounter (HOSPITAL_COMMUNITY): Payer: Self-pay

## 2016-10-12 ENCOUNTER — Telehealth: Payer: Self-pay | Admitting: Adult Health

## 2016-10-12 ENCOUNTER — Ambulatory Visit (HOSPITAL_COMMUNITY)
Admission: RE | Admit: 2016-10-12 | Discharge: 2016-10-12 | Disposition: A | Discharge status: Home / Self Care | Payer: Medicare Other | Source: Ambulatory Visit | Attending: Adult Health | Admitting: Adult Health

## 2016-10-12 DIAGNOSIS — C7970 Secondary malignant neoplasm of unspecified adrenal gland: Secondary | ICD-10-CM | POA: Insufficient documentation

## 2016-10-12 DIAGNOSIS — J9811 Atelectasis: Secondary | ICD-10-CM | POA: Diagnosis not present

## 2016-10-12 DIAGNOSIS — R188 Other ascites: Secondary | ICD-10-CM | POA: Diagnosis not present

## 2016-10-12 DIAGNOSIS — C50919 Malignant neoplasm of unspecified site of unspecified female breast: Secondary | ICD-10-CM | POA: Diagnosis not present

## 2016-10-12 DIAGNOSIS — I700 Atherosclerosis of aorta: Secondary | ICD-10-CM | POA: Diagnosis not present

## 2016-10-12 DIAGNOSIS — J984 Other disorders of lung: Secondary | ICD-10-CM | POA: Diagnosis not present

## 2016-10-12 DIAGNOSIS — E279 Disorder of adrenal gland, unspecified: Secondary | ICD-10-CM | POA: Insufficient documentation

## 2016-10-12 DIAGNOSIS — C7951 Secondary malignant neoplasm of bone: Secondary | ICD-10-CM | POA: Insufficient documentation

## 2016-10-12 DIAGNOSIS — R590 Localized enlarged lymph nodes: Secondary | ICD-10-CM | POA: Insufficient documentation

## 2016-10-12 DIAGNOSIS — J90 Pleural effusion, not elsewhere classified: Secondary | ICD-10-CM | POA: Diagnosis not present

## 2016-10-12 DIAGNOSIS — I2510 Atherosclerotic heart disease of native coronary artery without angina pectoris: Secondary | ICD-10-CM | POA: Insufficient documentation

## 2016-10-12 LAB — CANCER ANTIGEN 27.29: CAN 27.29: 448.5 U/mL — AB (ref 0.0–38.6)

## 2016-10-12 MED ORDER — IOPAMIDOL (ISOVUE-300) INJECTION 61%
INTRAVENOUS | Status: AC
  Filled 2016-10-12: qty 100

## 2016-10-12 MED ORDER — IOPAMIDOL (ISOVUE-300) INJECTION 61%
100.0000 mL | Freq: Once | INTRAVENOUS | Status: AC | PRN
  Administered 2016-10-12: 100 mL via INTRAVENOUS

## 2016-10-12 NOTE — Telephone Encounter (Signed)
No 10/1 los °

## 2016-10-13 ENCOUNTER — Non-Acute Institutional Stay: Payer: Medicare Other | Admitting: Internal Medicine

## 2016-10-13 ENCOUNTER — Encounter: Payer: Self-pay | Admitting: Internal Medicine

## 2016-10-13 DIAGNOSIS — R531 Weakness: Secondary | ICD-10-CM

## 2016-10-13 DIAGNOSIS — R180 Malignant ascites: Secondary | ICD-10-CM | POA: Diagnosis not present

## 2016-10-13 DIAGNOSIS — E43 Unspecified severe protein-calorie malnutrition: Secondary | ICD-10-CM

## 2016-10-13 DIAGNOSIS — R2681 Unsteadiness on feet: Secondary | ICD-10-CM

## 2016-10-13 DIAGNOSIS — C50919 Malignant neoplasm of unspecified site of unspecified female breast: Secondary | ICD-10-CM | POA: Diagnosis not present

## 2016-10-13 DIAGNOSIS — R339 Retention of urine, unspecified: Secondary | ICD-10-CM

## 2016-10-13 DIAGNOSIS — F419 Anxiety disorder, unspecified: Secondary | ICD-10-CM

## 2016-10-13 DIAGNOSIS — R630 Anorexia: Secondary | ICD-10-CM

## 2016-10-13 NOTE — Progress Notes (Signed)
[facility]  Provider: Blanchie Serve MD   Location:  [place] Room Number: 8 Place of Service:  ALF (13)  PCP: Blanchie Serve, MD Patient Care Team: Blanchie Serve, MD as PCP - General (Internal Medicine) Melina Modena, [facility] Shon Hough, MD as Consulting Physician (Ophthalmology) Molli Posey, MD as Consulting Physician (Obstetrics and Gynecology) Gerarda Fraction, MD as Consulting Physician (Ophthalmology) Lorelle Gibbs, MD as Consulting Physician (Radiology) Melrose Nakayama, MD as Consulting Physician (Orthopedic Surgery) Gerarda Fraction, MD as Referring Physician (Ophthalmology) Ardis Hughs, MD as Attending Physician (Urology) Magrinat, Virgie Dad, MD as Consulting Physician (Oncology) Ngetich, Nelda Bucks, NP as Nurse Practitioner [facility] Medicine)  Extended Emergency Contact Information Primary Emergency Contact: Black,Emily Address: [address]          [place], [place] [address] [name] of [place] Phone: [phone] Mobile Phone: [phone] Relation: Daughter Secondary Emergency Contact: Marcine Matar [place] Mobile Phone: [phone] Relation: Daughter   Goals of Care: Advanced Directive information Advanced Directives 10/13/2016  Does Patient Have a Medical Advance Directive? Yes  Type of Paramedic of [place];Living will;Out of facility DNR (pink MOST or yellow form)  Does patient want to make changes to medical advance directive? No - Patient declined  Copy of [place] in Chart? Yes  Would patient like information on creating a medical advance directive? -  Pre-existing out of facility DNR order (yellow form or pink MOST form) Yellow form placed in chart (order not valid for inpatient use);Pink MOST form placed in chart (order not valid for inpatient use)      Chief Complaint  Patient presents with  . Acute Visit    Decline  in health    HPI: Patient is a 83 y.o. female seen today for acute visit with decline in her health. She resides in ALF. She is seen in her room with 2 of her daughters present. She has been requiring increased assistance with her ADLs like transfer and dressing. Her po intake has been poor. She ate about 10% of her breakfast this am. She has no appetite. She has metastatic breast cancer with metastases to bone and adrenal gland. She is unable to perform intermittent cath and has a foley catheter in place.   Past Medical History:  Diagnosis Date  . Abnormal PET scan of mediastinum 11/11/2015  . Abnormalities of the hair 02/29/2012  . Arthritis   . BPV (benign positional vertigo)   . Cancer ([place])   . Cancer antigen 125 (CA 125) elevation 11/27/2013  . Candidiasis of skin and nails 03/19/2011  . 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 Chorioretinal scar, macular 01/23/2015  . Closed fracture of base of neck of femur ([place]) 03/18/2011  . Compression fracture of thoracic vertebra (H[facility]) 11/24/2012  . Contact dermatitis and other eczema, due to unspecified cause 05/07/2011  . Depression 07/23/2014  . Diverticulosis of colon (without mention of hemorrhage) 03/19/2011  . Edema 05/07/2011  . HOH (hard of hearing)   . Hypertension 02/24/2016  . Hypopotassemia 03/26/2011  . Hyposmolality and/or hyponatremia 03/19/2011  . Hypotension, unspecified 03/18/2011  . IBS (irritable bowel syndrome) 11/27/2013  . Idiopathic scoliosis 01/27/2016  . Impaired fasting glucose 03/19/2011  . Insomnia, unspecified 03/19/2011  . Kidney infection   . Lumbago 03/19/2011  . Macular degeneration   . Malignant neoplasm of breast (female), unspecified site 10/28/2002  . Mass of right lung 10/23/2015  . Metastasis to  adrenal gland ([place]) 11/21/2015   Breast cancer  . Metastatic breast cancer ([place]) 10/27/2004  . Nonexudative age-related macular degeneration 12/10/2010  . Osteoporosis 08/06/2014  . Osteoporosis,  unspecified 03/19/2011  . Other malaise and fatigue 03/19/2011  . Pain in joint, pelvic region and thigh 03/19/2011  . Primary osteoarthritis of right hip 05/28/2014   TOTAL HIP ARTHROPLASTY ANTERIOR APPROACH HARDWARE REMOVAL on 05/28/2014   . Purpura senilis ([place]) 07/31/2013  . Radiculopathy, cervical 03/23/2016   Right C6  . Rib fractures   . Right bundle branch block 08/30/2011  . Shortness of breath   . Spasm of muscle 03/19/2011  . Spinal stenosis, lumbar region, with neurogenic claudication 11/02/2011  . TMJ click 3/41/9379  . Trigger finger (acquired) 11/02/2011  . Unspecified constipation 03/19/2011  . Urinary frequency 04/24/2013  . UTI (urinary tract infection) 04/16/2013   04/16/13 pseudomonas aeruginosa: tx with Cipro 06/15/14 P. Aeruginosa [name] 500mg  IM q12h x 7 days.     . Vaginitis and vulvovaginitis 02/29/2012  . Vertigo 08/26/2015   Past Surgical History:  Procedure Laterality Date  . BREAST SURGERY Bilateral 2005-10/27/2004   lumpectomy- Streck,MD  . CATARACT EXTRACTION  2010   bialteral  . EYE SURGERY     cataract  . FRACTURE SURGERY Left 09/1980   ankle  . HARDWARE REMOVAL Right 05/28/2014   Procedure: HARDWARE REMOVAL;  Surgeon: Melrose Nakayama, MD;  Location: [place];  Service: Orthopedics;  Laterality: Right;  . HIP PINNING,CANNULATED  03/08/2011   Procedure: CANNULATED HIP PINNING;  Surgeon: Johnn Hai, MD;  Location: [facility];  Service: Orthopedics;  Laterality: Right;  . IR THORACENTESIS ASP PLEURAL SPACE W/IMG GUIDE  08/23/2016  . ORIF HIP FRACTURE Right 03/08/2011   Bean,MD  . SKIN CANCER EXCISION Left 10/12/2012   lower leg Dr. [place]  . SKIN LESION EXCISION Right 02/04/2011   Abdomen lesion spongiotic dermatitis-Taffeen, MD  . SQUAMOUS CELL CARCINOMA EXCISION Right 02/04/2011   forearmSyble Creek, MD  . TONSILLECTOMY    . TOTAL HIP ARTHROPLASTY Right 05/28/2014   Procedure: TOTAL HIP ARTHROPLASTY ANTERIOR APPROACH;  Surgeon: Melrose Nakayama, MD;  Location:  [place];  Service: Orthopedics;  Laterality: Right;    reports that she has never smoked. She has never used smokeless tobacco. She reports that she drinks alcohol. She reports that she does not use drugs. Social History   Social History  . Marital status: Widowed    Spouse name: N/A  . Number of children: N/A  . Years of education: N/A   Occupational History  . homemaker    Social History Main Topics  . Smoking status: Never Smoker  . Smokeless tobacco: Never Used  . Alcohol use 0.0 oz/week     Comment: occasional glass  . Drug use: No  . Sexual activity: No   Other Topics Concern  . Not on file   Social History Narrative   Lives at [facility]   Widowed   Never smoked   Alcohol occasionally   Walks with walker   Exercise none   POA, Living Will, DNR         Family History  Problem Relation Age of Onset  . Heart disease Mother   . Cancer Father   . Emphysema Brother   . Alzheimer's disease Brother     Health Maintenance  Topic Date Due  . MAMMOGRAM  08/11/2016  . INFLUENZA VACCINE  10/20/2016 (Originally 08/11/2016)  . PNA vac Low Risk Adult (2 of 2 -  PPSV23) 2016-11-24 (Originally 01/12/2004)  . TETANUS/TDAP  01/11/2018 (Originally 10/19/1939)  . DEXA SCAN  Completed    Allergies  Allergen Reactions  . Macrodantin [Nitrofurantoin] Other (See Comments)    Reaction:  Unknown   . Nystatin Swelling    Outpatient Encounter Prescriptions as of 10/13/2016  Medication Sig  . acetaminophen (TYLENOL) 500 MG tablet Take 1,000 mg by mouth every 6 (six) hours as needed for moderate pain.   Marland Kitchen ALPRAZolam (XANAX) 0.5 MG tablet Take 0.5 mg by mouth daily. In addition to scheduled daily dose pt may take 2 x day prn  . anastrozole (ARIMIDEX) 1 MG tablet Take 1 mg by mouth daily.  Marland Kitchen antiseptic oral rinse (BIOTENE) LIQD 15 mLs by Mouth Rinse route every 6 (six) hours as needed for dry mouth.   Marland Kitchen aspirin EC 81 MG tablet Take 81 mg by mouth daily.  . Bilberry 150 MG CAPS  Take 150 mg by mouth daily.  . Calcium Carb-Cholecalciferol (CALCIUM 600 + D PO) Take 1 tablet by mouth 2 (two) times daily.  . cholecalciferol (VITAMIN D) 1000 units tablet Take 1,000 Units by mouth daily.  . Cranberry 425 MG CAPS Take 1 capsule by mouth daily.  . Diphenhyd-Benzeth-Menth-Zn Ace (CALAGEL MAXIMUM STRENGTH EX) Apply 1 application topically as directed. 2 x day  . furosemide (LASIX) 40 MG tablet Take 1 tablet (40 mg total) by mouth daily. Hold if SBP < 110  . GLUCOSAMINE-CHONDROITIN PO Take 1 capsule by mouth daily.  Marland Kitchen guaiFENesin (MUCINEX) 600 MG 12 hr tablet Take 600 mg by mouth 2 (two) times daily as needed for cough.  . Lactobacillus Rhamnosus, GG, (CULTURELLE PO) Take 1 tablet by mouth daily.  Marland Kitchen loperamide (IMODIUM A-D) 2 MG tablet Take 4 mg by mouth as needed for diarrhea or loose stools. Give 4 mg by mouth as the initial dose, then 2 mg by mouth after each loose stool.  . magnesium hydroxide (MILK OF MAGNESIA) 400 MG/5ML suspension Take 30 mLs by mouth as needed for mild constipation.  . meclizine (ANTIVERT) 25 MG tablet Take 1 tablet (25 mg total) by mouth 3 (three) times daily as needed for dizziness.  . Meth-Hyo-M Bl-Na Phos-Ph Sal (URIBEL) 118 MG CAPS Take 1 capsule by mouth 3 (three) times daily as needed.  . mirtazapine (REMERON) 7.5 MG tablet Take 7.5 mg by mouth at bedtime.  . Multiple Vitamins-Minerals (PRESERVISION AREDS 2) CAPS Take 1 capsule by mouth daily.  . NON FORMULARY as needed. 1 gatorade bottle  . Omega-3 Fatty Acids (FISH OIL) 1000 MG CAPS Take 1 capsule by mouth daily.  Marland Kitchen omeprazole (PRILOSEC) 40 MG capsule Take 40 mg by mouth daily.  . ondansetron (ZOFRAN-ODT) 4 MG disintegrating tablet Take 4 mg by mouth daily as needed for nausea or vomiting.   . OXYGEN Inhale 2 L into the lungs continuous.  . polycarbophil (FIBERCON) 625 MG tablet Take 625 mg by mouth 2 (two) times daily.  . polyethylene glycol (MIRALAX / GLYCOLAX) packet Take 17 g by mouth at  bedtime. Pt also uses once daily as needed for constipation.  . potassium chloride (K-DUR,KLOR-CON) 10 MEQ tablet Take 10 mEq by mouth daily.  . traMADol (ULTRAM) 50 MG tablet Take 50 mg by mouth 4 (four) times daily -  with meals and at bedtime.   Marland Kitchen trimethoprim (TRIMPEX) 100 MG tablet Take 100 mg by mouth at bedtime.  . [DISCONTINUED] anastrozole (ARIMIDEX) 1 MG tablet Take 1 tablet (1 mg total) by mouth daily.  . [  DISCONTINUED] aspirin 81 MG chewable tablet Chew 81 mg by mouth daily.   . [DISCONTINUED] Bilberry 150 MG CAPS Take 150 mg by mouth daily.  . [DISCONTINUED] Calcium Carbonate-Vitamin D (CALCIUM 600+D) 600-400 MG-UNIT tablet Take 1 tablet by mouth 2 (two) times daily.  . [DISCONTINUED] Cholecalciferol 1000 units tablet Take 1,000 Units by mouth daily.  . [DISCONTINUED] Cranberry 425 MG CAPS Take 1 capsule by mouth daily.  . [DISCONTINUED] Glucosamine-Chondroitin (GLUCOSAMINE CHONDR COMPLEX PO) Take 2 tablets by mouth daily. for joints  . [DISCONTINUED] Lactobacillus Rhamnosus, GG, (CULTURELLE PO) Take 1 tablet by mouth daily.  . [DISCONTINUED] Multiple Vitamins-Minerals (PRESERVISION/LUTEIN) CAPS Take 1 capsule by mouth 2 (two) times daily.  . [DISCONTINUED] NON FORMULARY 1 bottle of gatorade as needed at residents request  . [DISCONTINUED] Omega-3 Fatty Acids (FISH OIL) 1000 MG CAPS Take 1 capsule by mouth daily.  . [DISCONTINUED] polycarbophil (FIBERCON) 625 MG tablet Take 625 mg by mouth 2 (two) times daily.   No facility-administered encounter medications on file as of 10/13/2016.     Review of Systems  Constitutional: Positive for appetite change and fatigue. Negative for chills and fever.  HENT: Positive for hearing loss and trouble swallowing. Negative for congestion and mouth sores.   Respiratory: Positive for shortness of breath. Negative for cough.   Cardiovascular: Negative for chest pain.  Gastrointestinal: Positive for abdominal distention. Negative for diarrhea,  nausea and vomiting.  Musculoskeletal: Positive for back pain and gait problem.  Neurological: Positive for weakness. Negative for dizziness.  Psychiatric/Behavioral: Positive for confusion.    Vitals:   10/13/16 1554  BP: (!) 145/61  Pulse: 85  Resp: 18  Temp: (!) 97 F (36.1 C)  TempSrc: Oral  SpO2: 95%  Weight: 113 lb (51.3 kg)  Height: 5\' 1"  (1.549 m)   Body mass index is 21.35 kg/m.   Wt Readings from Last 3 Encounters:  10/13/16 113 lb (51.3 kg)  10/08/16 129 lb (58.5 kg)  10/04/16 129 lb (58.5 kg)   Physical Exam  Constitutional:  Frail, elderly female in no acute distress  HENT:  Head: Normocephalic and atraumatic.  Dry mucus membrane  Eyes: Pupils are equal, round, and reactive to light. EOM are normal. Right eye exhibits no discharge. Left eye exhibits no discharge.  Neck: Neck supple.  Cardiovascular: Normal rate and regular rhythm.   Pulmonary/Chest: Effort normal. She has no wheezes. She has no rales.  Poor air entry to lung bases  Abdominal: Soft. Bowel sounds are normal. She exhibits distension. There is no tenderness. There is no rebound and no guarding.  Musculoskeletal: She exhibits edema.  Lymphadenopathy:    She has no cervical adenopathy.  Neurological: She is alert.  Oriented to person and place  Skin: Skin is warm and dry. She is not diaphoretic.  Psychiatric: She has a normal mood and affect.    Labs reviewed: Basic Metabolic Panel:  Recent Labs  02/18/16 1204  05/20/16 1658  08/16/16 1441  09/14/16 1326 09/27/16 10/07/16 10/11/16 1442  NA 129*  < > 134*  < > 131*  < > 130* 127* 128 123*  K 4.2  < > 4.1  < > 4.0  < > 4.1 4.3 4.6 4.8  CL 98*  --  100*  --   --   --   --   --  94  --   CO2 23  < > 28  < > 28  --  27  --  19 23  GLUCOSE 108*  < >  111*  < > 147*  --  140  --   --  121  BUN 13  < > 15  < > 19.1  < > 12.9 12 13  12.3  CREATININE 0.71  < > 0.81  < > 1.0  < > 0.9 0.8 0.8 0.8  CALCIUM 8.9  < > 7.6*  < > 8.4  --  8.3*  --   7.6 8.0*  < > = values in this interval not displayed. Liver Function Tests:  Recent Labs  09/14/16 1326 09/27/16 10/07/16 10/11/16 1442  AST 63* 46* 18 54*  ALT 30 19 48 21  ALKPHOS 300* 226* 232 255*  BILITOT 0.62  --  0.9 1.07  PROT 5.4*  --  5.2 5.3*  ALBUMIN 2.6*  --  2.8 2.4*    Recent Labs  05/20/16 1658  LIPASE 26   No results for input(s): AMMONIA in the last 8760 hours. CBC:  Recent Labs  08/16/16 1441 09/14/16 1326 09/27/16 10/11/16 1442  WBC 8.0 7.2 8.7 8.4  NEUTROABS 4.5 3.1  --  5.3  HGB 10.6* 11.1* 10.5* 11.8  HCT 32.8* 33.6* 32* 35.6  MCV 90.4 90.8  --  91.9  PLT 178 216 209 209   Cardiac Enzymes: No results for input(s): CKTOTAL, CKMB, CKMBINDEX, TROPONINI in the last 8760 hours. BNP: Invalid input(s): POCBNP Lab Results  Component Value Date   HGBA1C 5.9 01/23/2015   Lab Results  Component Value Date   TSH 3.215 08/25/2015   No results found for: [id number] Lab Results  Component Value Date   FOLATE 4.0 10/07/2016   Lab Results  Component Value Date   IRON 48 10/07/2016   FERRITIN 74 08/30/2005    Lipid Panel: No results for input(s): CHOL, HDL, LDLCALC, TRIG, CHOLHDL, LDLDIRECT in the last 8760 hours. Lab Results  Component Value Date   HGBA1C 5.9 01/23/2015    Procedures since last visit: Dg Chest 2 View  Result Date: 10/11/2016 CLINICAL DATA:  Breast cancer possible lung Mets short of breath EXAM: CHEST  2 VIEW COMPARISON:  08/23/2016, PET-CT 08/09/2016, thoracic spine radiograph 03/11/2016 FINDINGS: Moderate right pleural effusion and trace left pleural effusion. Atelectasis or infiltrate at the right lung base. Stable cardiomediastinal silhouette with aortic atherosclerosis. Postsurgical changes at the right axilla. Old appearing bilateral rib deformities. Scarring at the left lung apex. Stable moderate severe compression of lower thoracic vertebra. IMPRESSION: 1. Moderate right pleural effusion, increased compared to prior  with worsened atelectasis or consolidation at the right lung base. 2. Trace left pleural effusion Electronically Signed   By: Donavan Foil M.D.   On: 10/11/2016 14:49   Ct Chest W Contrast  Result Date: 10/12/2016 CLINICAL DATA:  83 year old female with history of metastatic breast cancer the adrenal glands and bones. Followup study. EXAM: CT CHEST, ABDOMEN, AND PELVIS WITH CONTRAST TECHNIQUE: Multidetector CT imaging of the chest, abdomen and pelvis was performed following the standard protocol during bolus administration of intravenous contrast. CONTRAST:  170mL ISOVUE-300 IOPAMIDOL (ISOVUE-300) INJECTION 61% COMPARISON:  PET-CT 08/09/2016. CT the chest, abdomen and pelvis 02/18/2016. FINDINGS: CT CHEST FINDINGS Cardiovascular: Heart size is normal. There is no significant pericardial fluid, thickening or pericardial calcification. There is aortic atherosclerosis, as well as atherosclerosis of the great vessels of the mediastinum and the coronary arteries, including calcified atherosclerotic plaque in the left main and right coronary arteries. Mediastinum/Nodes: No pathologically enlarged mediastinal, hilar or internal mammary lymph nodes. Esophagus is unremarkable in appearance. Numerous prominent but  nonenlarged enhancing left axillary lymph nodes, nonspecific but similar in size to prior PET-CT 08/09/2016, at which time these nonenlarged lymph nodes were hypermetabolic. Status post right axillary lymph node dissection. Lungs/Pleura: Large chronic right pleural effusion with extensive passive subsegmental atelectasis in the dependent portions of the right lung. Extensive chronic scarring in the right middle lobe where there is also cylindrical and varicose bronchiectasis. Persistent masslike partially calcified soft tissue thickening in the right apex, similar to prior examinations, most compatible with chronic post infectious or inflammatory scarring. Similar plaque-like scarring is also noted in the apex  of the left upper lobe as well. No definite new suspicious appearing pulmonary nodules or masses are noted. Trace left pleural effusion is new compared to the prior examination lying dependently. Musculoskeletal: Innumerable sclerotic lesions are noted throughout the visualized axial and appendicular skeleton, compatible with widespread metastatic disease to the bones. Old pathologic compression fracture of T11 with 90% loss of central vertebral body height is unchanged. Multiple healed and healing nondisplaced pathologic rib fractures bilaterally. CT ABDOMEN PELVIS FINDINGS Hepatobiliary: Liver has a slightly shrunken appearance and nodular contour, suggesting underlying cirrhosis. No discrete cystic or solid hepatic lesions. No intra or extrahepatic biliary ductal dilatation. Gallbladder is moderately distended. No definite findings to strongly suggest an acute cholecystitis at this time (assessment is limited by large volume of ascites). Pancreas: No pancreatic mass. No pancreatic ductal dilatation. No well-defined pancreatic or peripancreatic fluid collections. Spleen: Unremarkable. Adrenals/Urinary Tract: 2.0 x 1.0 cm left adrenal nodule is stable dating back to 02/18/2016. Increasing mass-like thickening of the right adrenal gland, which currently measures 4.3 x 1.6 cm, presumably from an enlarging metastatic lesion. Mild bilateral hydronephrosis, similar to prior examinations. The appearance of the kidneys is otherwise unremarkable. Ureters do not appear dilated. Urinary bladder is completely decompressed with an indwelling Foley catheter in place. Stomach/Bowel: The appearance of the stomach is normal. There is no pathologic dilatation of small bowel or colon. The appendix is not confidently identified and may be surgically absent. Regardless, there are no inflammatory changes noted adjacent to the cecum to suggest the presence of an acute appendicitis at this time. Vascular/Lymphatic: Aortic  atherosclerosis, without evidence of aneurysm or dissection in the abdominal or pelvic vasculature. No lymphadenopathy noted in the abdomen or pelvis. Reproductive: Uterus and ovaries are atrophic. Other: Moderate to large volume of ascites.  No pneumoperitoneum. Musculoskeletal: Innumerable sclerotic lesions are again noted throughout the visualized axial and appendicular skeleton, compatible with widespread metastatic disease to the bone. Status post right hip arthroplasty. IMPRESSION: 1. Widespread metastatic disease to the bone appears similar to prior examinations. There continues to be bilateral adrenal lesions, with the lesion in the left adrenal gland stable compared to prior studies, but increasing mass-like enlargement of the right adrenal gland, suggesting an enlarging metastatic lesion. Previously noted nonenlarged left axillary lymphadenopathy is stable in size, demonstrating avid enhancement (these lymph nodes were hypermetabolic on prior PET-CT). 2. Increasing moderate to large volume of ascites. 3. Stable large right pleural effusion. New trace left pleural effusion lying dependently. Areas of associated passive subsegmental atelectasis are noted throughout the right lung. Additional areas of scarring are also noted in the lungs bilaterally, as above. 4. Aortic atherosclerosis, in addition to 2 vessel coronary artery disease. 5. Additional incidental findings, as above. 6. No new sites of metastatic disease noted elsewhere in the chest, abdomen or pelvis. Aortic Atherosclerosis (ICD10-I70.0). Electronically Signed   By: Vinnie Langton M.D.   On: 10/12/2016 14:27  Ct Abdomen Pelvis W Contrast  Result Date: 10/12/2016 CLINICAL DATA:  83 year old female with history of metastatic breast cancer the adrenal glands and bones. Followup study. EXAM: CT CHEST, ABDOMEN, AND PELVIS WITH CONTRAST TECHNIQUE: Multidetector CT imaging of the chest, abdomen and pelvis was performed following the standard  protocol during bolus administration of intravenous contrast. CONTRAST:  143mL ISOVUE-300 IOPAMIDOL (ISOVUE-300) INJECTION 61% COMPARISON:  PET-CT 08/09/2016. CT the chest, abdomen and pelvis 02/18/2016. FINDINGS: CT CHEST FINDINGS Cardiovascular: Heart size is normal. There is no significant pericardial fluid, thickening or pericardial calcification. There is aortic atherosclerosis, as well as atherosclerosis of the great vessels of the mediastinum and the coronary arteries, including calcified atherosclerotic plaque in the left main and right coronary arteries. Mediastinum/Nodes: No pathologically enlarged mediastinal, hilar or internal mammary lymph nodes. Esophagus is unremarkable in appearance. Numerous prominent but nonenlarged enhancing left axillary lymph nodes, nonspecific but similar in size to prior PET-CT 08/09/2016, at which time these nonenlarged lymph nodes were hypermetabolic. Status post right axillary lymph node dissection. Lungs/Pleura: Large chronic right pleural effusion with extensive passive subsegmental atelectasis in the dependent portions of the right lung. Extensive chronic scarring in the right middle lobe where there is also cylindrical and varicose bronchiectasis. Persistent masslike partially calcified soft tissue thickening in the right apex, similar to prior examinations, most compatible with chronic post infectious or inflammatory scarring. Similar plaque-like scarring is also noted in the apex of the left upper lobe as well. No definite new suspicious appearing pulmonary nodules or masses are noted. Trace left pleural effusion is new compared to the prior examination lying dependently. Musculoskeletal: Innumerable sclerotic lesions are noted throughout the visualized axial and appendicular skeleton, compatible with widespread metastatic disease to the bones. Old pathologic compression fracture of T11 with 90% loss of central vertebral body height is unchanged. Multiple healed and  healing nondisplaced pathologic rib fractures bilaterally. CT ABDOMEN PELVIS FINDINGS Hepatobiliary: Liver has a slightly shrunken appearance and nodular contour, suggesting underlying cirrhosis. No discrete cystic or solid hepatic lesions. No intra or extrahepatic biliary ductal dilatation. Gallbladder is moderately distended. No definite findings to strongly suggest an acute cholecystitis at this time (assessment is limited by large volume of ascites). Pancreas: No pancreatic mass. No pancreatic ductal dilatation. No well-defined pancreatic or peripancreatic fluid collections. Spleen: Unremarkable. Adrenals/Urinary Tract: 2.0 x 1.0 cm left adrenal nodule is stable dating back to 02/18/2016. Increasing mass-like thickening of the right adrenal gland, which currently measures 4.3 x 1.6 cm, presumably from an enlarging metastatic lesion. Mild bilateral hydronephrosis, similar to prior examinations. The appearance of the kidneys is otherwise unremarkable. Ureters do not appear dilated. Urinary bladder is completely decompressed with an indwelling Foley catheter in place. Stomach/Bowel: The appearance of the stomach is normal. There is no pathologic dilatation of small bowel or colon. The appendix is not confidently identified and may be surgically absent. Regardless, there are no inflammatory changes noted adjacent to the cecum to suggest the presence of an acute appendicitis at this time. Vascular/Lymphatic: Aortic atherosclerosis, without evidence of aneurysm or dissection in the abdominal or pelvic vasculature. No lymphadenopathy noted in the abdomen or pelvis. Reproductive: Uterus and ovaries are atrophic. Other: Moderate to large volume of ascites.  No pneumoperitoneum. Musculoskeletal: Innumerable sclerotic lesions are again noted throughout the visualized axial and appendicular skeleton, compatible with widespread metastatic disease to the bone. Status post right hip arthroplasty. IMPRESSION: 1. Widespread  metastatic disease to the bone appears similar to prior examinations. There continues to be bilateral adrenal  lesions, with the lesion in the left adrenal gland stable compared to prior studies, but increasing mass-like enlargement of the right adrenal gland, suggesting an enlarging metastatic lesion. Previously noted nonenlarged left axillary lymphadenopathy is stable in size, demonstrating avid enhancement (these lymph nodes were hypermetabolic on prior PET-CT). 2. Increasing moderate to large volume of ascites. 3. Stable large right pleural effusion. New trace left pleural effusion lying dependently. Areas of associated passive subsegmental atelectasis are noted throughout the right lung. Additional areas of scarring are also noted in the lungs bilaterally, as above. 4. Aortic atherosclerosis, in addition to 2 vessel coronary artery disease. 5. Additional incidental findings, as above. 6. No new sites of metastatic disease noted elsewhere in the chest, abdomen or pelvis. Aortic Atherosclerosis (ICD10-I70.0). Electronically Signed   By: Vinnie Langton M.D.   On: 10/12/2016 14:27    Assessment/Plan  Generalized weakness With deconditioning, ongoing decline with her cancer and other co-morbidities. Patient needs increased assistance with her ADLs. She will need to be admitted to SNF setting. Family agrees. Paperwork including FL2 has been filled out. Obtain hospice consult for comfort care  Breast cancer with metastases Poor prognosis with ascites and pleural effusion. Goal is for comfort care now. Hospice consult ordered. D/c anastrozole after med review with daughters. Continue tramadol 50 mg qid and at bedtime with prn tylenol for pain.   Poor oral intake Encourage oral intake as tolerated. Liberalize her diet. Assist with feeding and continue remeron  Severe protein calorie malnutrition With por po intake and malignancy.   Unsteady gait Assist with transfer, meclizine prn for dizziness, high  fall risk  Urinary retention Has foley catheter in place. Family wants to continue prophylactic trimethoprim with recurrent UTI in past.   Anxiety Continue her scheduled alprazolam in am  Ascites With third spacing. Continue furosemide and kcl for now. Hospice consult with goal for comfort care. No dyspnea noted on exam. o2 as needed and consider morphine if needed for dyspnea.   Reviewed and discontinued several dietary supplements and aspirin for now  Comfort care for now. o2 as needed. Pain regimen in place. FL2 form filled out.    Labs/tests ordered:  none  Communication: reviewed care plan with patient, her daughters and charge nurse.     Blanchie Serve, MD [facility] [address] [place], [place] [address] Cell Phone (Monday-Friday 8 am - 5 pm): [phone] On Call: [phone] and follow prompts after 5 pm and on weekends Office Phone: [phone] Office Fax: [phone]

## 2016-10-14 DIAGNOSIS — C7951 Secondary malignant neoplasm of bone: Secondary | ICD-10-CM | POA: Diagnosis not present

## 2016-10-14 DIAGNOSIS — R188 Other ascites: Secondary | ICD-10-CM | POA: Diagnosis not present

## 2016-10-14 DIAGNOSIS — C7800 Secondary malignant neoplasm of unspecified lung: Secondary | ICD-10-CM | POA: Diagnosis not present

## 2016-10-14 DIAGNOSIS — R110 Nausea: Secondary | ICD-10-CM | POA: Diagnosis not present

## 2016-10-14 DIAGNOSIS — C50919 Malignant neoplasm of unspecified site of unspecified female breast: Secondary | ICD-10-CM | POA: Diagnosis not present

## 2016-10-14 DIAGNOSIS — H3530 Unspecified macular degeneration: Secondary | ICD-10-CM | POA: Diagnosis not present

## 2016-10-14 DIAGNOSIS — J910 Malignant pleural effusion: Secondary | ICD-10-CM | POA: Diagnosis not present

## 2016-10-14 DIAGNOSIS — K589 Irritable bowel syndrome without diarrhea: Secondary | ICD-10-CM | POA: Diagnosis not present

## 2016-10-14 DIAGNOSIS — N319 Neuromuscular dysfunction of bladder, unspecified: Secondary | ICD-10-CM | POA: Diagnosis not present

## 2016-10-14 DIAGNOSIS — C7970 Secondary malignant neoplasm of unspecified adrenal gland: Secondary | ICD-10-CM | POA: Diagnosis not present

## 2016-10-14 DIAGNOSIS — N390 Urinary tract infection, site not specified: Secondary | ICD-10-CM | POA: Diagnosis not present

## 2016-10-14 DIAGNOSIS — L209 Atopic dermatitis, unspecified: Secondary | ICD-10-CM | POA: Diagnosis not present

## 2016-10-14 DIAGNOSIS — R54 Age-related physical debility: Secondary | ICD-10-CM | POA: Diagnosis not present

## 2016-10-14 DIAGNOSIS — F339 Major depressive disorder, recurrent, unspecified: Secondary | ICD-10-CM | POA: Diagnosis not present

## 2016-10-14 DIAGNOSIS — E871 Hypo-osmolality and hyponatremia: Secondary | ICD-10-CM | POA: Diagnosis not present

## 2016-10-14 NOTE — Addendum Note (Signed)
Addended byBlanchie Serve on: 10/14/2016 05:16 PM   Modules accepted: Level of Service

## 2016-10-15 ENCOUNTER — Telehealth: Payer: Self-pay | Admitting: *Deleted

## 2016-10-15 NOTE — Telephone Encounter (Signed)
This RN spoke with pt's daughterRaquel Sarna for update on pt and any concerns.  [name] states " mom is settled in her nursing home room and hospice will be coming in Monday "  Only concern [name] has presently is " the fluid that is in her belly and legs- could that be drained off?"  This RN reviewed CT scans - and discussed with [name] due to her current disease burden as well as current physical condition- the procedure would not change her condition- fluid would more then likely quickly return.  [name] verbalized understanding of the above including goal of current therapy is for comfort.  Appointments for follow up in this office canceled.  Encouragement and validation of her mother and current situation given. With [name] stating understanding that though Hospice will be providing care as well as SNF- she can call this office for concerns or questions.  No further needs at this time.

## 2016-10-18 ENCOUNTER — Non-Acute Institutional Stay (SKILLED_NURSING_FACILITY): Payer: Medicare Other | Admitting: Internal Medicine

## 2016-10-18 ENCOUNTER — Encounter: Payer: Self-pay | Admitting: Internal Medicine

## 2016-10-18 DIAGNOSIS — J90 Pleural effusion, not elsewhere classified: Secondary | ICD-10-CM | POA: Diagnosis not present

## 2016-10-18 DIAGNOSIS — R339 Retention of urine, unspecified: Secondary | ICD-10-CM | POA: Diagnosis not present

## 2016-10-18 DIAGNOSIS — R531 Weakness: Secondary | ICD-10-CM

## 2016-10-18 DIAGNOSIS — J910 Malignant pleural effusion: Secondary | ICD-10-CM | POA: Diagnosis not present

## 2016-10-18 DIAGNOSIS — C7800 Secondary malignant neoplasm of unspecified lung: Secondary | ICD-10-CM | POA: Diagnosis not present

## 2016-10-18 DIAGNOSIS — C7951 Secondary malignant neoplasm of bone: Secondary | ICD-10-CM | POA: Diagnosis not present

## 2016-10-18 DIAGNOSIS — G8929 Other chronic pain: Secondary | ICD-10-CM

## 2016-10-18 DIAGNOSIS — N390 Urinary tract infection, site not specified: Secondary | ICD-10-CM | POA: Diagnosis not present

## 2016-10-18 DIAGNOSIS — R180 Malignant ascites: Secondary | ICD-10-CM | POA: Diagnosis not present

## 2016-10-18 DIAGNOSIS — C7970 Secondary malignant neoplasm of unspecified adrenal gland: Secondary | ICD-10-CM | POA: Diagnosis not present

## 2016-10-18 DIAGNOSIS — C50919 Malignant neoplasm of unspecified site of unspecified female breast: Secondary | ICD-10-CM

## 2016-10-18 DIAGNOSIS — K219 Gastro-esophageal reflux disease without esophagitis: Secondary | ICD-10-CM

## 2016-10-18 DIAGNOSIS — R188 Other ascites: Secondary | ICD-10-CM | POA: Diagnosis not present

## 2016-10-18 DIAGNOSIS — R630 Anorexia: Secondary | ICD-10-CM | POA: Diagnosis not present

## 2016-10-18 NOTE — Progress Notes (Signed)
Provider:  Blanchie Serve MD  Location:  [facility] Room Number: 13 Place of Service:  SNF (31)  PCP: Blanchie Serve, MD Patient Care Team: Blanchie Serve, MD as PCP - General (Internal Medicine) Cassandra Glenn, [facility] Shon Hough, MD as Consulting Physician (Ophthalmology) Molli Posey, MD as Consulting Physician (Obstetrics and Gynecology) Gerarda Fraction, MD as Consulting Physician (Ophthalmology) Lorelle Gibbs, MD as Consulting Physician (Radiology) Melrose Nakayama, MD as Consulting Physician (Orthopedic Surgery) Gerarda Fraction, MD as Referring Physician (Ophthalmology) Ardis Hughs, MD as Attending Physician (Urology) Magrinat, Virgie Dad, MD as Consulting Physician (Oncology) Ngetich, Nelda Bucks, NP as Nurse Practitioner [facility] Medicine)  Extended Emergency Contact Information Primary Emergency Contact: Black,Emily Address: [address]          Cassandra Glenn [address] [name] of [place] Phone: [phone] Mobile Phone: [phone] Relation: Daughter Secondary Emergency Contact: Marcine Matar [place] of [place] Mobile Phone: [phone] Relation: Daughter  Code Status: DNR Goals of Care: Advanced Directive information Advanced Directives 10/18/2016  Does Patient Have a Medical Advance Directive? Yes  Type of Paramedic of [place];Living will;Out of facility DNR (pink MOST or yellow form)  Does patient want to make changes to medical advance directive? No - Patient declined  Copy of [place] in Chart? Yes  Would patient like information on creating a medical advance directive? -  Pre-existing out of facility DNR order (yellow form or pink MOST form) Yellow form placed in chart (order not valid for inpatient use);Pink MOST form placed in chart (order not valid for inpatient use)      Chief Complaint  Patient presents with  . New Admit To SNF    New Admission  Visit     HPI: Patient is a 83 y.o. female seen today for admission visit. She was residing in assisted living facility prior to this and is now transferred to SNF with increased level of care and assistance with her ADLs. She is seen in her room today. She has breast cancer with metastases and now has generalized anasarca and deconditioning. She is now under hospice service with goal of care for comfort. She feels very weak and tired today. Denies any other concern.   Past Medical History:  Diagnosis Date  . Abnormal PET scan of mediastinum 11/11/2015  . Abnormalities of the hair 02/29/2012  . Arthritis   . BPV (benign positional vertigo)   . Cancer ([place])   . Cancer antigen 125 (CA 125) elevation 11/27/2013  . Candidiasis of skin and nails 03/19/2011  . 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 Chorioretinal scar, macular 01/23/2015  . Closed fracture of base of neck of femur ([place]) 03/18/2011  . Compression fracture of thoracic vertebra ([facility]) 11/24/2012  . Contact dermatitis and other eczema, due to unspecified cause 05/07/2011  . Depression 07/23/2014  . Diverticulosis of colon (without mention of hemorrhage) 03/19/2011  . Edema 05/07/2011  . HOH (hard of hearing)   . Hypertension 02/24/2016  . Hypopotassemia 03/26/2011  . Hyposmolality and/or hyponatremia 03/19/2011  . Hypotension, unspecified 03/18/2011  . IBS (irritable bowel syndrome) 11/27/2013  . Idiopathic scoliosis 01/27/2016  . Impaired fasting glucose 03/19/2011  . Insomnia, unspecified 03/19/2011  . Kidney infection   . Lumbago 03/19/2011  . Macular degeneration   . Malignant neoplasm of breast (female), unspecified site 10/28/2002  . Mass of right lung 10/23/2015  . Metastasis to adrenal gland ([place]) 11/21/2015   Breast cancer  . Metastatic breast  cancer ([place]) 10/27/2004  . Nonexudative age-related macular degeneration 12/10/2010  . Osteoporosis 08/06/2014  . Osteoporosis, unspecified 03/19/2011  . Other malaise and  fatigue 03/19/2011  . Pain in joint, pelvic region and thigh 03/19/2011  . Primary osteoarthritis of right hip 05/28/2014   TOTAL HIP ARTHROPLASTY ANTERIOR APPROACH HARDWARE REMOVAL on 05/28/2014   . Purpura senilis ([place]) 07/31/2013  . Radiculopathy, cervical 03/23/2016   Right C6  . Rib fractures   . Right bundle branch block 08/30/2011  . Shortness of breath   . Spasm of muscle 03/19/2011  . Spinal stenosis, lumbar region, with neurogenic claudication 11/02/2011  . TMJ click 3/41/9622  . Trigger finger (acquired) 11/02/2011  . Unspecified constipation 03/19/2011  . Urinary frequency 04/24/2013  . UTI (urinary tract infection) 04/16/2013   04/16/13 pseudomonas aeruginosa: tx with Cipro 06/15/14 P. Aeruginosa [name] 500mg  IM q12h x 7 days.     . Vaginitis and vulvovaginitis 02/29/2012  . Vertigo 08/26/2015   Past Surgical History:  Procedure Laterality Date  . BREAST SURGERY Bilateral 2005-10/27/2004   lumpectomy- Streck,MD  . CATARACT EXTRACTION  2010   bialteral  . EYE SURGERY     cataract  . FRACTURE SURGERY Left 09/1980   ankle  . HARDWARE REMOVAL Right 05/28/2014   Procedure: HARDWARE REMOVAL;  Surgeon: Melrose Nakayama, MD;  Location: [facility];  Service: Orthopedics;  Laterality: Right;  . HIP PINNING,CANNULATED  03/08/2011   Procedure: CANNULATED HIP PINNING;  Surgeon: Johnn Hai, MD;  Location: [facility];  Service: Orthopedics;  Laterality: Right;  . IR THORACENTESIS ASP PLEURAL SPACE W/IMG GUIDE  08/23/2016  . ORIF HIP FRACTURE Right 03/08/2011   Bean,MD  . SKIN CANCER EXCISION Left 10/12/2012   lower leg Dr. [place]  . SKIN LESION EXCISION Right 02/04/2011   Abdomen lesion spongiotic dermatitis-Taffeen, MD  . SQUAMOUS CELL CARCINOMA EXCISION Right 02/04/2011   forearmSyble Creek, MD  . TONSILLECTOMY    . TOTAL HIP ARTHROPLASTY Right 05/28/2014   Procedure: TOTAL HIP ARTHROPLASTY ANTERIOR APPROACH;  Surgeon: Melrose Nakayama, MD;  Location: [facility];  Service: Orthopedics;  Laterality:  Right;    reports that she has never smoked. She has never used smokeless tobacco. She reports that she drinks alcohol. She reports that she does not use drugs. Social History   Social History  . Marital status: Widowed    Spouse name: N/A  . Number of children: N/A  . Years of education: N/A   Occupational History  . homemaker    Social History Main Topics  . Smoking status: Never Smoker  . Smokeless tobacco: Never Used  . Alcohol use 0.0 oz/week     Comment: occasional glass  . Drug use: No  . Sexual activity: No   Other Topics Concern  . Not on file   Social History Narrative   [facility] at [facility]   Widowed   Never smoked   Alcohol occasionally   Walks with walker   Exercise none   POA, Living Will, DNR        Functional Status Survey:    Family History  Problem Relation Age of Onset  . Heart disease Mother   . Cancer Father   . Emphysema Brother   . Alzheimer's disease Brother     Health Maintenance  Topic Date Due  . MAMMOGRAM  08/11/2016  . INFLUENZA VACCINE  10/20/2016 (Originally 08/11/2016)  . PNA vac Low Risk Adult (2 of 2 - PPSV23) Nov 27, 2016 (Originally 01/12/2004)  . TETANUS/TDAP  01/11/2018 (Originally 10/19/1939)  . DEXA SCAN  Completed    Allergies  Allergen Reactions  . Macrodantin [Nitrofurantoin] Other (See Comments)    Reaction:  Unknown   . Nystatin Swelling    Outpatient Encounter Prescriptions as of 10/18/2016  Medication Sig  . acetaminophen (TYLENOL) 500 MG tablet Take 1,000 mg by mouth every 6 (six) hours as needed for moderate pain.   Marland Kitchen ALPRAZolam (XANAX) 0.5 MG tablet Take 0.5 mg by mouth daily. In addition to scheduled daily dose pt may take 2 x day prn  . antiseptic oral rinse (BIOTENE) LIQD 15 mLs by Mouth Rinse route every 6 (six) hours as needed for dry mouth.   . Diphenhyd-Benzeth-Menth-Zn Ace (CALAGEL MAXIMUM STRENGTH EX) Apply 1 application topically as directed. 2 x day  . furosemide (LASIX) 40 MG tablet Take 1  tablet (40 mg total) by mouth daily. Hold if SBP < 110  . guaiFENesin (MUCINEX) 600 MG 12 hr tablet Take 600 mg by mouth 2 (two) times daily as needed for cough.  . meclizine (ANTIVERT) 25 MG tablet Take 1 tablet (25 mg total) by mouth 3 (three) times daily as needed for dizziness.  . Meth-Hyo-M Bl-Na Phos-Ph Sal (URIBEL) 118 MG CAPS Take 1 capsule by mouth 3 (three) times daily as needed.  . mirtazapine (REMERON) 7.5 MG tablet Take 7.5 mg by mouth at bedtime.  . NON FORMULARY daily as needed. 1 gatorade bottle   . omeprazole (PRILOSEC) 40 MG capsule Take 40 mg by mouth daily.  . ondansetron (ZOFRAN-ODT) 4 MG disintegrating tablet Take 4 mg by mouth daily as needed for nausea or vomiting.   . OXYGEN Inhale 2 L into the lungs continuous.  . polyethylene glycol (MIRALAX / GLYCOLAX) packet Take 17 g by mouth daily. Pt also uses once daily as needed for constipation.  . potassium chloride (K-DUR,KLOR-CON) 10 MEQ tablet Take 10 mEq by mouth daily.  . traMADol (ULTRAM) 50 MG tablet Take 50 mg by mouth 4 (four) times daily -  with meals and at bedtime.   Marland Kitchen trimethoprim (TRIMPEX) 100 MG tablet Take 100 mg by mouth at bedtime.  . [DISCONTINUED] anastrozole (ARIMIDEX) 1 MG tablet Take 1 mg by mouth daily.  . [DISCONTINUED] aspirin EC 81 MG tablet Take 81 mg by mouth daily.  . [DISCONTINUED] Bilberry 150 MG CAPS Take 150 mg by mouth daily.  . [DISCONTINUED] Calcium Carb-Cholecalciferol (CALCIUM 600 + D PO) Take 1 tablet by mouth 2 (two) times daily.  . [DISCONTINUED] cholecalciferol (VITAMIN D) 1000 units tablet Take 1,000 Units by mouth daily.  . [DISCONTINUED] Cranberry 425 MG CAPS Take 1 capsule by mouth daily.  . [DISCONTINUED] GLUCOSAMINE-CHONDROITIN PO Take 1 capsule by mouth daily.  . [DISCONTINUED] Lactobacillus Rhamnosus, GG, (CULTURELLE PO) Take 1 tablet by mouth daily.  . [DISCONTINUED] loperamide (IMODIUM A-D) 2 MG tablet Take 4 mg by mouth as needed for diarrhea or loose stools. Give 4 mg by  mouth as the initial dose, then 2 mg by mouth after each loose stool.  . [DISCONTINUED] magnesium hydroxide (MILK OF MAGNESIA) 400 MG/5ML suspension Take 30 mLs by mouth as needed for mild constipation.  . [DISCONTINUED] Multiple Vitamins-Minerals (PRESERVISION AREDS 2) CAPS Take 1 capsule by mouth daily.  . [DISCONTINUED] Omega-3 Fatty Acids (FISH OIL) 1000 MG CAPS Take 1 capsule by mouth daily.  . [DISCONTINUED] polycarbophil (FIBERCON) 625 MG tablet Take 625 mg by mouth 2 (two) times daily.   No facility-administered encounter medications on file as of 10/18/2016.  Review of Systems  Constitutional: Positive for appetite change and fatigue. Negative for chills.  HENT: Positive for hearing loss and trouble swallowing. Negative for congestion, mouth sores and sore throat.   Eyes: Negative for visual disturbance.       Wears glasses  Respiratory: Positive for shortness of breath. Negative for cough.        With minimal exertion  Cardiovascular: Positive for leg swelling. Negative for chest pain and palpitations.  Gastrointestinal: Positive for abdominal distention and nausea. Negative for abdominal pain, blood in stool and vomiting.       Occasional nausea. Last bowel movement was 2 days ago. Passing gas.   Genitourinary: Negative for flank pain.       Has foley catheter with urinary retention  Musculoskeletal: Positive for back pain and gait problem.       Chronic back pain  Skin: Negative for rash and wound.  Neurological: Positive for dizziness and weakness. Negative for syncope and headaches.       Occasional dizziness.   Psychiatric/Behavioral: Positive for confusion.    Vitals:   10/18/16 1422  BP: 114/60  Pulse: 64  Resp: 20  Temp: (!) 97.1 F (36.2 C)  TempSrc: Oral  SpO2: 98%  Weight: 122 lb 9.6 oz (55.6 kg)  Height: 5' (1.524 m)   Body mass index is 23.94 kg/m. Physical Exam  Constitutional: No distress.  Frail elderly female  HENT:  Head: Normocephalic and  atraumatic.  Mouth/Throat: Oropharynx is clear and moist.  Eyes: Pupils are equal, round, and reactive to light.  Neck: Normal range of motion. Neck supple.  Cardiovascular: Normal rate and regular rhythm.   Pulmonary/Chest: Effort normal. No respiratory distress. She has no wheezes. She has no rales.  Decreased air entry to lung bases  Abdominal: Soft. Bowel sounds are normal. She exhibits distension. There is no tenderness. There is no guarding.  Musculoskeletal: She exhibits edema.  2+ edema to her legs, can move all 4 extremities, generalized weakness  Lymphadenopathy:    She has no cervical adenopathy.  Neurological: She is alert.  Oriented to person and place  Skin: Skin is warm and dry. No rash noted. She is not diaphoretic.  Psychiatric: She has a normal mood and affect.    Labs reviewed: Basic Metabolic Panel:  Recent Labs  02/18/16 1204  05/20/16 1658  08/16/16 1441  09/14/16 1326 09/27/16 10/07/16 10/11/16 1442  NA 129*  < > 134*  < > 131*  < > 130* 127* 128 123*  K 4.2  < > 4.1  < > 4.0  < > 4.1 4.3 4.6 4.8  CL 98*  --  100*  --   --   --   --   --  94  --   CO2 23  < > 28  < > 28  --  27  --  19 23  GLUCOSE 108*  < > 111*  < > 147*  --  140  --   --  121  BUN 13  < > 15  < > 19.1  < > 12.9 12 13  12.3  CREATININE 0.71  < > 0.81  < > 1.0  < > 0.9 0.8 0.8 0.8  CALCIUM 8.9  < > 7.6*  < > 8.4  --  8.3*  --  7.6 8.0*  < > = values in this interval not displayed. Liver Function Tests:  Recent Labs  09/14/16 1326 09/27/16 10/07/16 10/11/16 1442  AST 63* 46*  18 54*  ALT 30 19 48 21  ALKPHOS 300* 226* 232 255*  BILITOT 0.62  --  0.9 1.07  PROT 5.4*  --  5.2 5.3*  ALBUMIN 2.6*  --  2.8 2.4*    Recent Labs  05/20/16 1658  LIPASE 26   No results for input(s): AMMONIA in the last 8760 hours. CBC:  Recent Labs  08/16/16 1441 09/14/16 1326 09/27/16 10/11/16 1442  WBC 8.0 7.2 8.7 8.4  NEUTROABS 4.5 3.1  --  5.3  HGB 10.6* 11.1* 10.5* 11.8  HCT 32.8*  33.6* 32* 35.6  MCV 90.4 90.8  --  91.9  PLT 178 216 209 209   Cardiac Enzymes: No results for input(s): CKTOTAL, CKMB, CKMBINDEX, TROPONINI in the last 8760 hours. BNP: Invalid input(s): POCBNP Lab Results  Component Value Date   HGBA1C 5.9 01/23/2015   Lab Results  Component Value Date   TSH 3.215 08/25/2015   No results found for: [id number] Lab Results  Component Value Date   FOLATE 4.0 10/07/2016   Lab Results  Component Value Date   IRON 48 10/07/2016   FERRITIN 74 08/30/2005    Imaging and Procedures obtained prior to SNF admission: Ct Chest W Contrast  Result Date: 10/12/2016 CLINICAL DATA:  83 year old female with history of metastatic breast cancer the adrenal glands and bones. Followup study. EXAM: CT CHEST, ABDOMEN, AND PELVIS WITH CONTRAST TECHNIQUE: Multidetector CT imaging of the chest, abdomen and pelvis was performed following the standard protocol during bolus administration of intravenous contrast. CONTRAST:  136mL ISOVUE-300 IOPAMIDOL (ISOVUE-300) INJECTION 61% COMPARISON:  PET-CT 08/09/2016. CT the chest, abdomen and pelvis 02/18/2016. FINDINGS: CT CHEST FINDINGS Cardiovascular: Heart size is normal. There is no significant pericardial fluid, thickening or pericardial calcification. There is aortic atherosclerosis, as well as atherosclerosis of the great vessels of the mediastinum and the coronary arteries, including calcified atherosclerotic plaque in the left main and right coronary arteries. Mediastinum/Nodes: No pathologically enlarged mediastinal, hilar or internal mammary lymph nodes. Esophagus is unremarkable in appearance. Numerous prominent but nonenlarged enhancing left axillary lymph nodes, nonspecific but similar in size to prior PET-CT 08/09/2016, at which time these nonenlarged lymph nodes were hypermetabolic. Status post right axillary lymph node dissection. Lungs/Pleura: Large chronic right pleural effusion with extensive passive subsegmental  atelectasis in the dependent portions of the right lung. Extensive chronic scarring in the right middle lobe where there is also cylindrical and varicose bronchiectasis. Persistent masslike partially calcified soft tissue thickening in the right apex, similar to prior examinations, most compatible with chronic post infectious or inflammatory scarring. Similar plaque-like scarring is also noted in the apex of the left upper lobe as well. No definite new suspicious appearing pulmonary nodules or masses are noted. Trace left pleural effusion is new compared to the prior examination lying dependently. Musculoskeletal: Innumerable sclerotic lesions are noted throughout the visualized axial and appendicular skeleton, compatible with widespread metastatic disease to the bones. Old pathologic compression fracture of T11 with 90% loss of central vertebral body height is unchanged. Multiple healed and healing nondisplaced pathologic rib fractures bilaterally. CT ABDOMEN PELVIS FINDINGS Hepatobiliary: Liver has a slightly shrunken appearance and nodular contour, suggesting underlying cirrhosis. No discrete cystic or solid hepatic lesions. No intra or extrahepatic biliary ductal dilatation. Gallbladder is moderately distended. No definite findings to strongly suggest an acute cholecystitis at this time (assessment is limited by large volume of ascites). Pancreas: No pancreatic mass. No pancreatic ductal dilatation. No well-defined pancreatic or peripancreatic fluid collections. Spleen: Unremarkable. Adrenals/Urinary  Tract: 2.0 x 1.0 cm left adrenal nodule is stable dating back to 02/18/2016. Increasing mass-like thickening of the right adrenal gland, which currently measures 4.3 x 1.6 cm, presumably from an enlarging metastatic lesion. Mild bilateral hydronephrosis, similar to prior examinations. The appearance of the kidneys is otherwise unremarkable. Ureters do not appear dilated. Urinary bladder is completely decompressed  with an indwelling Foley catheter in place. Stomach/Bowel: The appearance of the stomach is normal. There is no pathologic dilatation of small bowel or colon. The appendix is not confidently identified and may be surgically absent. Regardless, there are no inflammatory changes noted adjacent to the cecum to suggest the presence of an acute appendicitis at this time. Vascular/Lymphatic: Aortic atherosclerosis, without evidence of aneurysm or dissection in the abdominal or pelvic vasculature. No lymphadenopathy noted in the abdomen or pelvis. Reproductive: Uterus and ovaries are atrophic. Other: Moderate to large volume of ascites.  No pneumoperitoneum. Musculoskeletal: Innumerable sclerotic lesions are again noted throughout the visualized axial and appendicular skeleton, compatible with widespread metastatic disease to the bone. Status post right hip arthroplasty. IMPRESSION: 1. Widespread metastatic disease to the bone appears similar to prior examinations. There continues to be bilateral adrenal lesions, with the lesion in the left adrenal gland stable compared to prior studies, but increasing mass-like enlargement of the right adrenal gland, suggesting an enlarging metastatic lesion. Previously noted nonenlarged left axillary lymphadenopathy is stable in size, demonstrating avid enhancement (these lymph nodes were hypermetabolic on prior PET-CT). 2. Increasing moderate to large volume of ascites. 3. Stable large right pleural effusion. New trace left pleural effusion lying dependently. Areas of associated passive subsegmental atelectasis are noted throughout the right lung. Additional areas of scarring are also noted in the lungs bilaterally, as above. 4. Aortic atherosclerosis, in addition to 2 vessel coronary artery disease. 5. Additional incidental findings, as above. 6. No new sites of metastatic disease noted elsewhere in the chest, abdomen or pelvis. Aortic Atherosclerosis (ICD10-I70.0). Electronically  Signed   By: Vinnie Langton M.D.   On: 10/12/2016 14:27   Ct Abdomen Pelvis W Contrast  Result Date: 10/12/2016 CLINICAL DATA:  83 year old female with history of metastatic breast cancer the adrenal glands and bones. Followup study. EXAM: CT CHEST, ABDOMEN, AND PELVIS WITH CONTRAST TECHNIQUE: Multidetector CT imaging of the chest, abdomen and pelvis was performed following the standard protocol during bolus administration of intravenous contrast. CONTRAST:  138mL ISOVUE-300 IOPAMIDOL (ISOVUE-300) INJECTION 61% COMPARISON:  PET-CT 08/09/2016. CT the chest, abdomen and pelvis 02/18/2016. FINDINGS: CT CHEST FINDINGS Cardiovascular: Heart size is normal. There is no significant pericardial fluid, thickening or pericardial calcification. There is aortic atherosclerosis, as well as atherosclerosis of the great vessels of the mediastinum and the coronary arteries, including calcified atherosclerotic plaque in the left main and right coronary arteries. Mediastinum/Nodes: No pathologically enlarged mediastinal, hilar or internal mammary lymph nodes. Esophagus is unremarkable in appearance. Numerous prominent but nonenlarged enhancing left axillary lymph nodes, nonspecific but similar in size to prior PET-CT 08/09/2016, at which time these nonenlarged lymph nodes were hypermetabolic. Status post right axillary lymph node dissection. Lungs/Pleura: Large chronic right pleural effusion with extensive passive subsegmental atelectasis in the dependent portions of the right lung. Extensive chronic scarring in the right middle lobe where there is also cylindrical and varicose bronchiectasis. Persistent masslike partially calcified soft tissue thickening in the right apex, similar to prior examinations, most compatible with chronic post infectious or inflammatory scarring. Similar plaque-like scarring is also noted in the apex of the left upper lobe  as well. No definite new suspicious appearing pulmonary nodules or masses are  noted. Trace left pleural effusion is new compared to the prior examination lying dependently. Musculoskeletal: Innumerable sclerotic lesions are noted throughout the visualized axial and appendicular skeleton, compatible with widespread metastatic disease to the bones. Old pathologic compression fracture of T11 with 90% loss of central vertebral body height is unchanged. Multiple healed and healing nondisplaced pathologic rib fractures bilaterally. CT ABDOMEN PELVIS FINDINGS Hepatobiliary: Liver has a slightly shrunken appearance and nodular contour, suggesting underlying cirrhosis. No discrete cystic or solid hepatic lesions. No intra or extrahepatic biliary ductal dilatation. Gallbladder is moderately distended. No definite findings to strongly suggest an acute cholecystitis at this time (assessment is limited by large volume of ascites). Pancreas: No pancreatic mass. No pancreatic ductal dilatation. No well-defined pancreatic or peripancreatic fluid collections. Spleen: Unremarkable. Adrenals/Urinary Tract: 2.0 x 1.0 cm left adrenal nodule is stable dating back to 02/18/2016. Increasing mass-like thickening of the right adrenal gland, which currently measures 4.3 x 1.6 cm, presumably from an enlarging metastatic lesion. Mild bilateral hydronephrosis, similar to prior examinations. The appearance of the kidneys is otherwise unremarkable. Ureters do not appear dilated. Urinary bladder is completely decompressed with an indwelling Foley catheter in place. Stomach/Bowel: The appearance of the stomach is normal. There is no pathologic dilatation of small bowel or colon. The appendix is not confidently identified and may be surgically absent. Regardless, there are no inflammatory changes noted adjacent to the cecum to suggest the presence of an acute appendicitis at this time. Vascular/Lymphatic: Aortic atherosclerosis, without evidence of aneurysm or dissection in the abdominal or pelvic vasculature. No  lymphadenopathy noted in the abdomen or pelvis. Reproductive: Uterus and ovaries are atrophic. Other: Moderate to large volume of ascites.  No pneumoperitoneum. Musculoskeletal: Innumerable sclerotic lesions are again noted throughout the visualized axial and appendicular skeleton, compatible with widespread metastatic disease to the bone. Status post right hip arthroplasty. IMPRESSION: 1. Widespread metastatic disease to the bone appears similar to prior examinations. There continues to be bilateral adrenal lesions, with the lesion in the left adrenal gland stable compared to prior studies, but increasing mass-like enlargement of the right adrenal gland, suggesting an enlarging metastatic lesion. Previously noted nonenlarged left axillary lymphadenopathy is stable in size, demonstrating avid enhancement (these lymph nodes were hypermetabolic on prior PET-CT). 2. Increasing moderate to large volume of ascites. 3. Stable large right pleural effusion. New trace left pleural effusion lying dependently. Areas of associated passive subsegmental atelectasis are noted throughout the right lung. Additional areas of scarring are also noted in the lungs bilaterally, as above. 4. Aortic atherosclerosis, in addition to 2 vessel coronary artery disease. 5. Additional incidental findings, as above. 6. No new sites of metastatic disease noted elsewhere in the chest, abdomen or pelvis. Aortic Atherosclerosis (ICD10-I70.0). Electronically Signed   By: Vinnie Langton M.D.   On: 10/12/2016 14:27    Assessment/Plan  Generalized weakness With deconditioning, ongoing decline with her cancer and other co-morbidities. Comfort care. Provide assistance with ADLs as needed   Breast cancer with metastases Poor prognosis with ascites and pleural effusion. Goal is for comfort care now. Continue hospice services. Continue tramadol 50 mg qid for pain.    Poor appetite With severe protein calorie malnutrition. Assist with feeding.  Continue remeron and prn zofran  Urinary retention Has foley catheter in place. Continue foley care  gerd Continue omeprazole and monitor   Pleural effusion Malignant, continue furosemide and oxygen for now  Ascites With third spacing.  Continue furosemide and kcl for now. Encouraged to use her oxygen if needed  Chronic pain With mets to bone and history of fracture and DDD. Continue tramadol as above with prn tylenol  Recurrent UTI Continue prophylactic trimethoprim per family request.      Labs/tests ordered:  none   Communication: reviewed care plan with patient and charge nurse.     Blanchie Serve, MD Internal Medicine [facility] [address] [place], [place] [address] Cell Phone (Monday-Friday 8 am - 5 pm): [phone] On Call: [phone] and follow prompts after 5 pm and on weekends Office Phone: [phone] Office Fax: [phone]

## 2016-10-19 DIAGNOSIS — C7970 Secondary malignant neoplasm of unspecified adrenal gland: Secondary | ICD-10-CM | POA: Diagnosis not present

## 2016-10-19 DIAGNOSIS — R188 Other ascites: Secondary | ICD-10-CM | POA: Diagnosis not present

## 2016-10-19 DIAGNOSIS — C7800 Secondary malignant neoplasm of unspecified lung: Secondary | ICD-10-CM | POA: Diagnosis not present

## 2016-10-19 DIAGNOSIS — C7951 Secondary malignant neoplasm of bone: Secondary | ICD-10-CM | POA: Diagnosis not present

## 2016-10-19 DIAGNOSIS — C50919 Malignant neoplasm of unspecified site of unspecified female breast: Secondary | ICD-10-CM | POA: Diagnosis not present

## 2016-10-19 DIAGNOSIS — J910 Malignant pleural effusion: Secondary | ICD-10-CM | POA: Diagnosis not present

## 2016-10-20 ENCOUNTER — Other Ambulatory Visit: Payer: Self-pay | Admitting: Oncology

## 2016-10-23 ENCOUNTER — Other Ambulatory Visit: Payer: Self-pay | Admitting: Oncology

## 2016-10-25 ENCOUNTER — Non-Acute Institutional Stay (SKILLED_NURSING_FACILITY): Payer: Medicare Other | Admitting: Family

## 2016-10-25 ENCOUNTER — Encounter: Payer: Self-pay | Admitting: Family

## 2016-10-25 DIAGNOSIS — L89151 Pressure ulcer of sacral region, stage 1: Secondary | ICD-10-CM

## 2016-10-25 DIAGNOSIS — R609 Edema, unspecified: Secondary | ICD-10-CM | POA: Diagnosis not present

## 2016-10-25 NOTE — Progress Notes (Signed)
Location:  [place] Room Number: [place] of Service:  SNF (31) Provider: Dinah Ngetich FNP-C  Blanchie Serve, MD  Patient Care Team: Blanchie Serve, MD as PCP - General (Internal Medicine) Melina Modena, [facility] Shon Hough, MD as Consulting Physician (Ophthalmology) Molli Posey, MD as Consulting Physician (Obstetrics and Gynecology) Gerarda Fraction, MD as Consulting Physician (Ophthalmology) Lorelle Gibbs, MD as Consulting Physician (Radiology) Melrose Nakayama, MD as Consulting Physician (Orthopedic Surgery) Gerarda Fraction, MD as Referring Physician (Ophthalmology) Ardis Hughs, MD as Attending Physician (Urology) Magrinat, Virgie Dad, MD as Consulting Physician (Oncology) Ngetich, Nelda Bucks, NP as Nurse Practitioner (Family Medicine)  Extended Emergency Contact Information Primary Emergency Contact: Black,Emily Address: [address]          [place], [place] [address] [name] of [place] Phone: [phone] Mobile Phone: [phone] Relation: Daughter Secondary Emergency Contact: Marcine Matar [place] of [place] Mobile Phone: [phone] Relation: Daughter  Code Status:  DNR Goals of care: Advanced Directive information Advanced Directives 10/25/2016  Does Patient Have a Medical Advance Directive? Yes  Type of Advance Directive Out of facility DNR (pink MOST or yellow form);Living will;Healthcare Power of Attorney  Does patient want to make changes to medical advance directive? -  Copy of [place] in Chart? Yes  Would patient like information on creating a medical advance directive? -  Pre-existing out of facility DNR order (yellow form or pink MOST form) Yellow form placed in chart (order not valid for inpatient use)     Chief Complaint  Patient presents with  . Acute Visit    rash    HPI:  Pt is a 83 y.o. female seen today at [facility] for an acute visit for evaluation of  rash on left leg. She is seen in her room today per facility Nurse request. Nurse reports has red spots on her left leg. Patient has had chronic left leg swelling and redness. She is currently on Hospice service due to physical deconditioning and decreased oral intake. She denies any fever or chills. She states lower extremities edema worsening.    Past Medical History:  Diagnosis Date  . Abnormal PET scan of mediastinum 11/11/2015  . Abnormalities of the hair 02/29/2012  . Arthritis   . BPV (benign positional vertigo)   . Cancer ([place])   . Cancer antigen 125 (CA 125) elevation 11/27/2013  . Candidiasis of skin and nails 03/19/2011  . 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 Chorioretinal scar, macular 01/23/2015  . Closed fracture of base of neck of femur ([place]) 03/18/2011  . Compression fracture of thoracic vertebra ([facility]) 11/24/2012  . Contact dermatitis and other eczema, due to unspecified cause 05/07/2011  . Depression 07/23/2014  . Diverticulosis of colon (without mention of hemorrhage) 03/19/2011  . Edema 05/07/2011  . HOH (hard of hearing)   . Hypertension 02/24/2016  . Hypopotassemia 03/26/2011  . Hyposmolality and/or hyponatremia 03/19/2011  . Hypotension, unspecified 03/18/2011  . IBS (irritable bowel syndrome) 11/27/2013  . Idiopathic scoliosis 01/27/2016  . Impaired fasting glucose 03/19/2011  . Insomnia, unspecified 03/19/2011  . Kidney infection   . Lumbago 03/19/2011  . Macular degeneration   . Malignant neoplasm of breast (female), unspecified site 10/28/2002  . Mass of right lung 10/23/2015  . Metastasis to adrenal gland ([place]) 11/21/2015   Breast cancer  . Metastatic breast cancer ([place]) 10/27/2004  . Nonexudative age-related macular degeneration 12/10/2010  . Osteoporosis 08/06/2014  . Osteoporosis, unspecified 03/19/2011  . Other  malaise and fatigue 03/19/2011  . Pain in joint, pelvic region and thigh 03/19/2011  . Primary osteoarthritis of right hip 05/28/2014    TOTAL HIP ARTHROPLASTY ANTERIOR APPROACH HARDWARE REMOVAL on 05/28/2014   . Purpura senilis ([place]) 07/31/2013  . Radiculopathy, cervical 03/23/2016   Right C6  . Rib fractures   . Right bundle branch block 08/30/2011  . Shortness of breath   . Spasm of muscle 03/19/2011  . Spinal stenosis, lumbar region, with neurogenic claudication 11/02/2011  . TMJ click 02/20/4707  . Trigger finger (acquired) 11/02/2011  . Unspecified constipation 03/19/2011  . Urinary frequency 04/24/2013  . UTI (urinary tract infection) 04/16/2013   04/16/13 pseudomonas aeruginosa: tx with Cipro 06/15/14 P. Aeruginosa [name] 500mg  IM q12h x 7 days.     . Vaginitis and vulvovaginitis 02/29/2012  . Vertigo 08/26/2015   Past Surgical History:  Procedure Laterality Date  . BREAST SURGERY Bilateral 2005-10/27/2004   lumpectomy- Streck,MD  . CATARACT EXTRACTION  2010   bialteral  . EYE SURGERY     cataract  . FRACTURE SURGERY Left 09/1980   ankle  . HARDWARE REMOVAL Right 05/28/2014   Procedure: HARDWARE REMOVAL;  Surgeon: Melrose Nakayama, MD;  Location: [facility];  Service: Orthopedics;  Laterality: Right;  . HIP PINNING,CANNULATED  03/08/2011   Procedure: CANNULATED HIP PINNING;  Surgeon: Johnn Hai, MD;  Location: [facility];  Service: Orthopedics;  Laterality: Right;  . IR THORACENTESIS ASP PLEURAL SPACE W/IMG GUIDE  08/23/2016  . ORIF HIP FRACTURE Right 03/08/2011   Bean,MD  . SKIN CANCER EXCISION Left 10/12/2012   lower leg Dr. [place]  . SKIN LESION EXCISION Right 02/04/2011   Abdomen lesion spongiotic dermatitis-Taffeen, MD  . SQUAMOUS CELL CARCINOMA EXCISION Right 02/04/2011   forearmSyble Creek, MD  . TONSILLECTOMY    . TOTAL HIP ARTHROPLASTY Right 05/28/2014   Procedure: TOTAL HIP ARTHROPLASTY ANTERIOR APPROACH;  Surgeon: Melrose Nakayama, MD;  Location: [facility];  Service: Orthopedics;  Laterality: Right;    Allergies  Allergen Reactions  . Macrodantin [Nitrofurantoin] Other (See Comments)    Reaction:  Unknown   .  Nystatin Swelling    Outpatient Encounter Prescriptions as of 10/25/2016  Medication Sig  . acetaminophen (TYLENOL) 500 MG tablet Take 1,000 mg by mouth every 6 (six) hours as needed for moderate pain.   Marland Kitchen ALPRAZolam (XANAX) 0.5 MG tablet Take 0.5 mg by mouth daily. In addition to scheduled daily dose pt may take 2 x day prn  . antiseptic oral rinse (BIOTENE) LIQD 15 mLs by Mouth Rinse route every 6 (six) hours as needed for dry mouth.   . Diphenhyd-Benzeth-Menth-Zn Ace (CALAGEL MAXIMUM STRENGTH EX) Apply 1 application topically as directed. 2 x day  . furosemide (LASIX) 40 MG tablet Take 1 tablet (40 mg total) by mouth daily. Hold if SBP < 110  . guaiFENesin (MUCINEX) 600 MG 12 hr tablet Take 600 mg by mouth 2 (two) times daily as needed for cough.  . hydroxypropyl methylcellulose / hypromellose (ISOPTO TEARS / GONIOVISC) 2.5 % ophthalmic solution Place 1 drop into both eyes 3 (three) times daily.  . meclizine (ANTIVERT) 25 MG tablet Take 1 tablet (25 mg total) by mouth 3 (three) times daily as needed for dizziness.  . Meth-Hyo-M Bl-Na Phos-Ph Sal (URIBEL) 118 MG CAPS Take 1 capsule by mouth 3 (three) times daily as needed.  . mirtazapine (REMERON) 7.5 MG tablet Take 7.5 mg by mouth at bedtime.  . NON FORMULARY daily as needed. 1 gatorade bottle   .  omeprazole (PRILOSEC) 40 MG capsule Take 40 mg by mouth daily.  . ondansetron (ZOFRAN-ODT) 4 MG disintegrating tablet Take 4 mg by mouth daily as needed for nausea or vomiting.   . OXYGEN Inhale 2 L into the lungs continuous.  . polyethylene glycol (MIRALAX / GLYCOLAX) packet Take 17 g by mouth daily. Pt also uses once daily as needed for constipation.  . potassium chloride (K-DUR,KLOR-CON) 10 MEQ tablet Take 10 mEq by mouth daily.  . traMADol (ULTRAM) 50 MG tablet Take 50 mg by mouth 4 (four) times daily -  with meals and at bedtime.   Marland Kitchen trimethoprim (TRIMPEX) 100 MG tablet Take 100 mg by mouth at bedtime.   No facility-administered encounter  medications on file as of 10/25/2016.     Review of Systems  Constitutional: Positive for appetite change and fatigue. Negative for chills and fever.  HENT: Positive for hearing loss. Negative for congestion, rhinorrhea, sinus pain, sinus pressure and sneezing.   Respiratory: Negative for cough, chest tightness and wheezing.        Shortness of breath with exertion   Cardiovascular: Positive for leg swelling. Negative for chest pain and palpitations.  Gastrointestinal: Positive for abdominal distention. Negative for abdominal pain, constipation, diarrhea, nausea and vomiting.  Genitourinary: Negative for urgency.       Indwelling foley catheter   Musculoskeletal: Positive for back pain and gait problem.  Skin: Negative for pallor and rash.       Left leg chronic redness   Neurological: Negative for dizziness, light-headedness and headaches.  Hematological: Does not bruise/bleed easily.  Psychiatric/Behavioral: Negative for agitation, confusion and sleep disturbance. The patient is not nervous/anxious.     Immunization History  Administered Date(s) Administered  . Influenza Whole 10/12/2011, 10/12/2012  . Influenza-Unspecified 10/23/2013, 10/10/2014, 12/12/2015  . PPD Test 07/07/2010, 02/08/2012, 06/29/2014, 03/24/2016  . Pneumococcal Conjugate-13 01/12/2003  . Zoster 01/12/2007   Pertinent  Health Maintenance Due  Topic Date Due  . INFLUENZA VACCINE  08/11/2016  . MAMMOGRAM  08/11/2016  . PNA vac Low Risk Adult (2 of 2 - PPSV23) 11/25/2016 (Originally 01/12/2004)  . DEXA SCAN  Completed   Fall Risk  08/25/2016 11/11/2015 10/16/2015 07/03/2015 01/28/2015  Falls in the past year? No Yes No No No  Number falls in past yr: - 1 - - -  Injury with Fall? - Yes - - -  Risk Factor Category  - High Fall Risk - - -  Risk for fall due to : - History of fall(s);Impaired balance/gait;Impaired mobility - - -  Follow up - Falls evaluation completed - - -   Functional Status Survey:    Vitals:    10/25/16 1205  BP: 130/70  Pulse: 92  Resp: 18  Temp: 98.6 F (37 C)  SpO2: 94%  Weight: 135 lb 8 oz (61.5 kg)  Height: 5' (1.524 m)   Body mass index is 26.46 kg/m. Physical Exam  Constitutional: She is oriented to person, place, and time.  Frail elderly in no acute distress   HENT:  Head: Normocephalic.  Right Ear: External ear normal.  Left Ear: External ear normal.  Mouth/Throat: Oropharynx is clear and moist. No oropharyngeal exudate.  Eyes: Pupils are equal, round, and reactive to light. Conjunctivae and EOM are normal. Right eye exhibits no discharge. Left eye exhibits no discharge. No scleral icterus.  Neck: Normal range of motion. No JVD present. No thyromegaly present.  Cardiovascular: Normal rate, regular rhythm, normal heart sounds and intact distal pulses.  Exam reveals no gallop and no friction rub.   No murmur heard. Pulmonary/Chest: Effort normal and breath sounds normal. No respiratory distress. She has no wheezes. She has no rales.  Abdominal: Soft. Bowel sounds are normal. She exhibits distension. There is no tenderness. There is no rebound and no guarding.  Musculoskeletal:  Moves x 4 extremities. Unsteady gait uses walker with assistance. Generalized weakness noted. Bilateral lower extremities 2-3+ edema.  Lymphadenopathy:    She has no cervical adenopathy.  Neurological: She is oriented to person, place, and time. Coordination normal.  Skin: Skin is warm and dry. No rash noted. No erythema. No pallor.  Left leg chronic redness skin cool to touch due to edema.   Psychiatric: She has a normal mood and affect.   Labs reviewed:  Recent Labs  02/18/16 1204  05/20/16 1658  08/16/16 1441  09/14/16 1326 09/27/16 10/07/16 10/11/16 1442  NA 129*  < > 134*  < > 131*  < > 130* 127* 128 123*  K 4.2  < > 4.1  < > 4.0  < > 4.1 4.3 4.6 4.8  CL 98*  --  100*  --   --   --   --   --  94  --   CO2 23  < > 28  < > 28  --  27  --  19 23  GLUCOSE 108*  < > 111*  < >  147*  --  140  --   --  121  BUN 13  < > 15  < > 19.1  < > 12.9 12 13  12.3  CREATININE 0.71  < > 0.81  < > 1.0  < > 0.9 0.8 0.8 0.8  CALCIUM 8.9  < > 7.6*  < > 8.4  --  8.3*  --  7.6 8.0*  < > = values in this interval not displayed.  Recent Labs  09/14/16 1326 09/27/16 10/07/16 10/11/16 1442  AST 63* 46* 18 54*  ALT 30 19 48 21  ALKPHOS 300* 226* 232 255*  BILITOT 0.62  --  0.9 1.07  PROT 5.4*  --  5.2 5.3*  ALBUMIN 2.6*  --  2.8 2.4*    Recent Labs  08/16/16 1441 09/14/16 1326 09/27/16 10/11/16 1442  WBC 8.0 7.2 8.7 8.4  NEUTROABS 4.5 3.1  --  5.3  HGB 10.6* 11.1* 10.5* 11.8  HCT 32.8* 33.6* 32* 35.6  MCV 90.4 90.8  --  91.9  PLT 178 216 209 209   Lab Results  Component Value Date   TSH 3.215 08/25/2015   Lab Results  Component Value Date   HGBA1C 5.9 01/23/2015   Significant Diagnostic Results in last 30 days:  Dg Chest 2 View  Result Date: 10/11/2016 CLINICAL DATA:  Breast cancer possible lung Mets short of breath EXAM: CHEST  2 VIEW COMPARISON:  08/23/2016, PET-CT 08/09/2016, thoracic spine radiograph 03/11/2016 FINDINGS: Moderate right pleural effusion and trace left pleural effusion. Atelectasis or infiltrate at the right lung base. Stable cardiomediastinal silhouette with aortic atherosclerosis. Postsurgical changes at the right axilla. Old appearing bilateral rib deformities. Scarring at the left lung apex. Stable moderate severe compression of lower thoracic vertebra. IMPRESSION: 1. Moderate right pleural effusion, increased compared to prior with worsened atelectasis or consolidation at the right lung base. 2. Trace left pleural effusion Electronically Signed   By: Donavan Foil M.D.   On: 10/11/2016 14:49   Ct Chest W Contrast  Result Date: 10/12/2016 CLINICAL DATA:  83 year old female with history of metastatic breast cancer the adrenal glands and bones. Followup study. EXAM: CT CHEST, ABDOMEN, AND PELVIS WITH CONTRAST TECHNIQUE: Multidetector CT imaging of  the chest, abdomen and pelvis was performed following the standard protocol during bolus administration of intravenous contrast. CONTRAST:  143mL ISOVUE-300 IOPAMIDOL (ISOVUE-300) INJECTION 61% COMPARISON:  PET-CT 08/09/2016. CT the chest, abdomen and pelvis 02/18/2016. FINDINGS: CT CHEST FINDINGS Cardiovascular: Heart size is normal. There is no significant pericardial fluid, thickening or pericardial calcification. There is aortic atherosclerosis, as well as atherosclerosis of the great vessels of the mediastinum and the coronary arteries, including calcified atherosclerotic plaque in the left main and right coronary arteries. Mediastinum/Nodes: No pathologically enlarged mediastinal, hilar or internal mammary lymph nodes. Esophagus is unremarkable in appearance. Numerous prominent but nonenlarged enhancing left axillary lymph nodes, nonspecific but similar in size to prior PET-CT 08/09/2016, at which time these nonenlarged lymph nodes were hypermetabolic. Status post right axillary lymph node dissection. Lungs/Pleura: Large chronic right pleural effusion with extensive passive subsegmental atelectasis in the dependent portions of the right lung. Extensive chronic scarring in the right middle lobe where there is also cylindrical and varicose bronchiectasis. Persistent masslike partially calcified soft tissue thickening in the right apex, similar to prior examinations, most compatible with chronic post infectious or inflammatory scarring. Similar plaque-like scarring is also noted in the apex of the left upper lobe as well. No definite new suspicious appearing pulmonary nodules or masses are noted. Trace left pleural effusion is new compared to the prior examination lying dependently. Musculoskeletal: Innumerable sclerotic lesions are noted throughout the visualized axial and appendicular skeleton, compatible with widespread metastatic disease to the bones. Old pathologic compression fracture of T11 with 90% loss  of central vertebral body height is unchanged. Multiple healed and healing nondisplaced pathologic rib fractures bilaterally. CT ABDOMEN PELVIS FINDINGS Hepatobiliary: Liver has a slightly shrunken appearance and nodular contour, suggesting underlying cirrhosis. No discrete cystic or solid hepatic lesions. No intra or extrahepatic biliary ductal dilatation. Gallbladder is moderately distended. No definite findings to strongly suggest an acute cholecystitis at this time (assessment is limited by large volume of ascites). Pancreas: No pancreatic mass. No pancreatic ductal dilatation. No well-defined pancreatic or peripancreatic fluid collections. Spleen: Unremarkable. Adrenals/Urinary Tract: 2.0 x 1.0 cm left adrenal nodule is stable dating back to 02/18/2016. Increasing mass-like thickening of the right adrenal gland, which currently measures 4.3 x 1.6 cm, presumably from an enlarging metastatic lesion. Mild bilateral hydronephrosis, similar to prior examinations. The appearance of the kidneys is otherwise unremarkable. Ureters do not appear dilated. Urinary bladder is completely decompressed with an indwelling Foley catheter in place. Stomach/Bowel: The appearance of the stomach is normal. There is no pathologic dilatation of small bowel or colon. The appendix is not confidently identified and may be surgically absent. Regardless, there are no inflammatory changes noted adjacent to the cecum to suggest the presence of an acute appendicitis at this time. Vascular/Lymphatic: Aortic atherosclerosis, without evidence of aneurysm or dissection in the abdominal or pelvic vasculature. No lymphadenopathy noted in the abdomen or pelvis. Reproductive: Uterus and ovaries are atrophic. Other: Moderate to large volume of ascites.  No pneumoperitoneum. Musculoskeletal: Innumerable sclerotic lesions are again noted throughout the visualized axial and appendicular skeleton, compatible with widespread metastatic disease to the bone.  Status post right hip arthroplasty. IMPRESSION: 1. Widespread metastatic disease to the bone appears similar to prior examinations. There continues to be bilateral adrenal lesions, with the lesion in the left adrenal gland stable compared to  prior studies, but increasing mass-like enlargement of the right adrenal gland, suggesting an enlarging metastatic lesion. Previously noted nonenlarged left axillary lymphadenopathy is stable in size, demonstrating avid enhancement (these lymph nodes were hypermetabolic on prior PET-CT). 2. Increasing moderate to large volume of ascites. 3. Stable large right pleural effusion. New trace left pleural effusion lying dependently. Areas of associated passive subsegmental atelectasis are noted throughout the right lung. Additional areas of scarring are also noted in the lungs bilaterally, as above. 4. Aortic atherosclerosis, in addition to 2 vessel coronary artery disease. 5. Additional incidental findings, as above. 6. No new sites of metastatic disease noted elsewhere in the chest, abdomen or pelvis. Aortic Atherosclerosis (ICD10-I70.0). Electronically Signed   By: Vinnie Langton M.D.   On: 10/12/2016 14:27   Ct Abdomen Pelvis W Contrast  Result Date: 10/12/2016 CLINICAL DATA:  83 year old female with history of metastatic breast cancer the adrenal glands and bones. Followup study. EXAM: CT CHEST, ABDOMEN, AND PELVIS WITH CONTRAST TECHNIQUE: Multidetector CT imaging of the chest, abdomen and pelvis was performed following the standard protocol during bolus administration of intravenous contrast. CONTRAST:  166mL ISOVUE-300 IOPAMIDOL (ISOVUE-300) INJECTION 61% COMPARISON:  PET-CT 08/09/2016. CT the chest, abdomen and pelvis 02/18/2016. FINDINGS: CT CHEST FINDINGS Cardiovascular: Heart size is normal. There is no significant pericardial fluid, thickening or pericardial calcification. There is aortic atherosclerosis, as well as atherosclerosis of the great vessels of the  mediastinum and the coronary arteries, including calcified atherosclerotic plaque in the left main and right coronary arteries. Mediastinum/Nodes: No pathologically enlarged mediastinal, hilar or internal mammary lymph nodes. Esophagus is unremarkable in appearance. Numerous prominent but nonenlarged enhancing left axillary lymph nodes, nonspecific but similar in size to prior PET-CT 08/09/2016, at which time these nonenlarged lymph nodes were hypermetabolic. Status post right axillary lymph node dissection. Lungs/Pleura: Large chronic right pleural effusion with extensive passive subsegmental atelectasis in the dependent portions of the right lung. Extensive chronic scarring in the right middle lobe where there is also cylindrical and varicose bronchiectasis. Persistent masslike partially calcified soft tissue thickening in the right apex, similar to prior examinations, most compatible with chronic post infectious or inflammatory scarring. Similar plaque-like scarring is also noted in the apex of the left upper lobe as well. No definite new suspicious appearing pulmonary nodules or masses are noted. Trace left pleural effusion is new compared to the prior examination lying dependently. Musculoskeletal: Innumerable sclerotic lesions are noted throughout the visualized axial and appendicular skeleton, compatible with widespread metastatic disease to the bones. Old pathologic compression fracture of T11 with 90% loss of central vertebral body height is unchanged. Multiple healed and healing nondisplaced pathologic rib fractures bilaterally. CT ABDOMEN PELVIS FINDINGS Hepatobiliary: Liver has a slightly shrunken appearance and nodular contour, suggesting underlying cirrhosis. No discrete cystic or solid hepatic lesions. No intra or extrahepatic biliary ductal dilatation. Gallbladder is moderately distended. No definite findings to strongly suggest an acute cholecystitis at this time (assessment is limited by large  volume of ascites). Pancreas: No pancreatic mass. No pancreatic ductal dilatation. No well-defined pancreatic or peripancreatic fluid collections. Spleen: Unremarkable. Adrenals/Urinary Tract: 2.0 x 1.0 cm left adrenal nodule is stable dating back to 02/18/2016. Increasing mass-like thickening of the right adrenal gland, which currently measures 4.3 x 1.6 cm, presumably from an enlarging metastatic lesion. Mild bilateral hydronephrosis, similar to prior examinations. The appearance of the kidneys is otherwise unremarkable. Ureters do not appear dilated. Urinary bladder is completely decompressed with an indwelling Foley catheter in place. Stomach/Bowel: The appearance  of the stomach is normal. There is no pathologic dilatation of small bowel or colon. The appendix is not confidently identified and may be surgically absent. Regardless, there are no inflammatory changes noted adjacent to the cecum to suggest the presence of an acute appendicitis at this time. Vascular/Lymphatic: Aortic atherosclerosis, without evidence of aneurysm or dissection in the abdominal or pelvic vasculature. No lymphadenopathy noted in the abdomen or pelvis. Reproductive: Uterus and ovaries are atrophic. Other: Moderate to large volume of ascites.  No pneumoperitoneum. Musculoskeletal: Innumerable sclerotic lesions are again noted throughout the visualized axial and appendicular skeleton, compatible with widespread metastatic disease to the bone. Status post right hip arthroplasty. IMPRESSION: 1. Widespread metastatic disease to the bone appears similar to prior examinations. There continues to be bilateral adrenal lesions, with the lesion in the left adrenal gland stable compared to prior studies, but increasing mass-like enlargement of the right adrenal gland, suggesting an enlarging metastatic lesion. Previously noted nonenlarged left axillary lymphadenopathy is stable in size, demonstrating avid enhancement (these lymph nodes were  hypermetabolic on prior PET-CT). 2. Increasing moderate to large volume of ascites. 3. Stable large right pleural effusion. New trace left pleural effusion lying dependently. Areas of associated passive subsegmental atelectasis are noted throughout the right lung. Additional areas of scarring are also noted in the lungs bilaterally, as above. 4. Aortic atherosclerosis, in addition to 2 vessel coronary artery disease. 5. Additional incidental findings, as above. 6. No new sites of metastatic disease noted elsewhere in the chest, abdomen or pelvis. Aortic Atherosclerosis (ICD10-I70.0). Electronically Signed   By: Vinnie Langton M.D.   On: 10/12/2016 14:27    Assessment/Plan 1. Peripheral edema Worsening edema 2-3+ to lower extremities. Increased Furosemide 40 mg tablet to twice daily. Increase potassium chloride to 20 meq tablet daily.   2. Pressure injury of sacral region, stage 1 Poor oral intake and immobility contributing to skin breakdown. Apply zinc oxide to sacral and gluteal area twice daily and as needed after peri care.Notify provider if worsening.Gel cushion to chair.     Family/ staff Communication: Reviewed plan of care with patient and facility Nurse supervisor  Labs/tests ordered: None   Sandrea Hughs, NP

## 2016-10-26 DIAGNOSIS — C7800 Secondary malignant neoplasm of unspecified lung: Secondary | ICD-10-CM | POA: Diagnosis not present

## 2016-10-26 DIAGNOSIS — C50919 Malignant neoplasm of unspecified site of unspecified female breast: Secondary | ICD-10-CM | POA: Diagnosis not present

## 2016-10-26 DIAGNOSIS — J910 Malignant pleural effusion: Secondary | ICD-10-CM | POA: Diagnosis not present

## 2016-10-26 DIAGNOSIS — C7970 Secondary malignant neoplasm of unspecified adrenal gland: Secondary | ICD-10-CM | POA: Diagnosis not present

## 2016-10-26 DIAGNOSIS — C7951 Secondary malignant neoplasm of bone: Secondary | ICD-10-CM | POA: Diagnosis not present

## 2016-10-26 DIAGNOSIS — R188 Other ascites: Secondary | ICD-10-CM | POA: Diagnosis not present

## 2016-10-28 ENCOUNTER — Encounter: Payer: Self-pay | Admitting: Family

## 2016-10-28 ENCOUNTER — Non-Acute Institutional Stay (SKILLED_NURSING_FACILITY): Payer: Medicare Other | Admitting: Family

## 2016-10-28 DIAGNOSIS — R600 Localized edema: Secondary | ICD-10-CM | POA: Diagnosis not present

## 2016-10-28 DIAGNOSIS — L03116 Cellulitis of left lower limb: Secondary | ICD-10-CM | POA: Diagnosis not present

## 2016-10-28 DIAGNOSIS — R609 Edema, unspecified: Secondary | ICD-10-CM | POA: Diagnosis not present

## 2016-10-28 NOTE — Progress Notes (Signed)
Location:  [facility] Room Number: [place] of Service:  SNF (31) Provider: Kyrra Prada FNP-C  Blanchie Serve, MD  Patient Care Team: Blanchie Serve, MD as PCP - General (Internal Medicine) Melina Modena, [facility] Shon Hough, MD as Consulting Physician (Ophthalmology) Molli Posey, MD as Consulting Physician (Obstetrics and Gynecology) Gerarda Fraction, MD as Consulting Physician (Ophthalmology) Lorelle Gibbs, MD as Consulting Physician (Radiology) Melrose Nakayama, MD as Consulting Physician (Orthopedic Surgery) Gerarda Fraction, MD as Referring Physician (Ophthalmology) Ardis Hughs, MD as Attending Physician (Urology) Magrinat, Virgie Dad, MD as Consulting Physician (Oncology) Talayia Hjort, Nelda Bucks, NP as Nurse Practitioner (Family Medicine)  Extended Emergency Contact Information Primary Emergency Contact: Black,Emily Address: [address]          [place], [place] [address] [name] of [place] Phone: [phone] Mobile Phone: [phone] Relation: Daughter Secondary Emergency Contact: Marcine Matar [place] of [place] Mobile Phone: [phone] Relation: Daughter  Code Status:  DNR Goals of care: Advanced Directive information Advanced Directives 10/28/2016  Does Patient Have a Medical Advance Directive? Yes  Type of Advance Directive Out of facility DNR (pink MOST or yellow form);Living will;Healthcare Power of Attorney  Does patient want to make changes to medical advance directive? -  Copy of [place] in Chart? Yes  Would patient like information on creating a medical advance directive? -  Pre-existing out of facility DNR order (yellow form or pink MOST form) Yellow form placed in chart (order not valid for inpatient use)     Chief Complaint  Patient presents with  . Acute Visit    pain increase    HPI:  Pt is a 83 y.o. female seen today at [facility] for an acute visit for  evaluation of abdominal pain.she is seen in her room today with daughter at bedside.Facility Nurse reports patient complained of abdominal pain during the night request tylenol for pain.She is currently on scheduled Tramadol 50 mg Tablet with meals and at bedtime.Pain has improved this morning. She denies any coughing,nausea, vomiting,constipation or diarrhea. Of note she has metastatic breast cancer. She continues to follow up with Hospice service.    Past Medical History:  Diagnosis Date  . Abnormal PET scan of mediastinum 11/11/2015  . Abnormalities of the hair 02/29/2012  . Arthritis   . BPV (benign positional vertigo)   . Cancer ([place])   . Cancer antigen 125 (CA 125) elevation 11/27/2013  . Candidiasis of skin and nails 03/19/2011  . 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 Chorioretinal scar, macular 01/23/2015  . Closed fracture of base of neck of femur ([place]) 03/18/2011  . Compression fracture of thoracic vertebra ([facility]) 11/24/2012  . Contact dermatitis and other eczema, due to unspecified cause 05/07/2011  . Depression 07/23/2014  . Diverticulosis of colon (without mention of hemorrhage) 03/19/2011  . Edema 05/07/2011  . HOH (hard of hearing)   . Hypertension 02/24/2016  . Hypopotassemia 03/26/2011  . Hyposmolality and/or hyponatremia 03/19/2011  . Hypotension, unspecified 03/18/2011  . IBS (irritable bowel syndrome) 11/27/2013  . Idiopathic scoliosis 01/27/2016  . Impaired fasting glucose 03/19/2011  . Insomnia, unspecified 03/19/2011  . Kidney infection   . Lumbago 03/19/2011  . Macular degeneration   . Malignant neoplasm of breast (female), unspecified site 10/28/2002  . Mass of right lung 10/23/2015  . Metastasis to adrenal gland ([place]) 11/21/2015   Breast cancer  . Metastatic breast cancer ([place]) 10/27/2004  . Nonexudative age-related macular degeneration 12/10/2010  . Osteoporosis 08/06/2014  .  Osteoporosis, unspecified 03/19/2011  . Other malaise and fatigue 03/19/2011  .  Pain in joint, pelvic region and thigh 03/19/2011  . Primary osteoarthritis of right hip 05/28/2014   TOTAL HIP ARTHROPLASTY ANTERIOR APPROACH HARDWARE REMOVAL on 05/28/2014   . Purpura senilis ([place]) 07/31/2013  . Radiculopathy, cervical 03/23/2016   Right C6  . Rib fractures   . Right bundle branch block 08/30/2011  . Shortness of breath   . Spasm of muscle 03/19/2011  . Spinal stenosis, lumbar region, with neurogenic claudication 11/02/2011  . TMJ click 7/42/5956  . Trigger finger (acquired) 11/02/2011  . Unspecified constipation 03/19/2011  . Urinary frequency 04/24/2013  . UTI (urinary tract infection) 04/16/2013   04/16/13 pseudomonas aeruginosa: tx with Cipro 06/15/14 P. Aeruginosa [name] 500mg  IM q12h x 7 days.     . Vaginitis and vulvovaginitis 02/29/2012  . Vertigo 08/26/2015   Past Surgical History:  Procedure Laterality Date  . BREAST SURGERY Bilateral 2005-10/27/2004   lumpectomy- Streck,MD  . CATARACT EXTRACTION  2010   bialteral  . EYE SURGERY     cataract  . FRACTURE SURGERY Left 09/1980   ankle  . HARDWARE REMOVAL Right 05/28/2014   Procedure: HARDWARE REMOVAL;  Surgeon: Melrose Nakayama, MD;  Location: [facility];  Service: Orthopedics;  Laterality: Right;  . HIP PINNING,CANNULATED  03/08/2011   Procedure: CANNULATED HIP PINNING;  Surgeon: Johnn Hai, MD;  Location: [facility];  Service: Orthopedics;  Laterality: Right;  . IR THORACENTESIS ASP PLEURAL SPACE W/IMG GUIDE  08/23/2016  . ORIF HIP FRACTURE Right 03/08/2011   Bean,MD  . SKIN CANCER EXCISION Left 10/12/2012   lower leg Dr. [place]  . SKIN LESION EXCISION Right 02/04/2011   Abdomen lesion spongiotic dermatitis-Taffeen, MD  . SQUAMOUS CELL CARCINOMA EXCISION Right 02/04/2011   forearmSyble Creek, MD  . TONSILLECTOMY    . TOTAL HIP ARTHROPLASTY Right 05/28/2014   Procedure: TOTAL HIP ARTHROPLASTY ANTERIOR APPROACH;  Surgeon: Melrose Nakayama, MD;  Location: [facility];  Service: Orthopedics;  Laterality: Right;    Allergies    Allergen Reactions  . Macrodantin [Nitrofurantoin] Other (See Comments)    Reaction:  Unknown   . Nystatin Swelling    Outpatient Encounter Prescriptions as of 10/28/2016  Medication Sig  . acetaminophen (TYLENOL) 500 MG tablet Take 1,000 mg by mouth every 6 (six) hours as needed for moderate pain.   Marland Kitchen ALPRAZolam (XANAX) 0.5 MG tablet Take 0.5 mg by mouth daily. In addition to scheduled daily dose pt may take 2 x day prn  . antiseptic oral rinse (BIOTENE) LIQD 15 mLs by Mouth Rinse route every 6 (six) hours as needed for dry mouth.   . Diphenhyd-Benzeth-Menth-Zn Ace (CALAGEL MAXIMUM STRENGTH EX) Apply 1 application topically as directed. 2 x day  . furosemide (LASIX) 40 MG tablet Take 40 mg by mouth 2 (two) times daily. Hold if SBP <110  . guaiFENesin (MUCINEX) 600 MG 12 hr tablet Take 600 mg by mouth 2 (two) times daily as needed for cough.  . hydroxypropyl methylcellulose / hypromellose (ISOPTO TEARS / GONIOVISC) 2.5 % ophthalmic solution Place 1 drop into both eyes 3 (three) times daily.  . meclizine (ANTIVERT) 25 MG tablet Take 1 tablet (25 mg total) by mouth 3 (three) times daily as needed for dizziness.  . mirtazapine (REMERON) 7.5 MG tablet Take 7.5 mg by mouth at bedtime.  . NON FORMULARY daily as needed. 1 gatorade bottle   . omeprazole (PRILOSEC) 40 MG capsule Take 40 mg by mouth daily.  Marland Kitchen  ondansetron (ZOFRAN-ODT) 4 MG disintegrating tablet Take 4 mg by mouth daily as needed for nausea or vomiting.   . OXYGEN Inhale 2 L into the lungs continuous.  . phenazopyridine (PYRIDIUM) 100 MG tablet Take 100 mg by mouth 3 (three) times daily as needed for pain. No more than 3 consecutive days  . polyethylene glycol (MIRALAX / GLYCOLAX) packet Take 17 g by mouth daily. Pt also uses once daily as needed for constipation.  . potassium chloride SA (K-DUR,KLOR-CON) 20 MEQ tablet Take 20 mEq by mouth daily.  . traMADol (ULTRAM) 50 MG tablet Take 50 mg by mouth 4 (four) times daily -  with meals  and at bedtime.   Marland Kitchen trimethoprim (TRIMPEX) 100 MG tablet Take 100 mg by mouth at bedtime.  Marland Kitchen ZINC OXIDE EX Apply 1 application topically 2 (two) times daily. And as needed after peri-care.  . [DISCONTINUED] furosemide (LASIX) 40 MG tablet Take 1 tablet (40 mg total) by mouth daily. Hold if SBP < 110  . [DISCONTINUED] Meth-Hyo-M Bl-Na Phos-Ph Sal (URIBEL) 118 MG CAPS Take 1 capsule by mouth 3 (three) times daily as needed.  . [DISCONTINUED] potassium chloride (K-DUR,KLOR-CON) 10 MEQ tablet Take 10 mEq by mouth daily.   No facility-administered encounter medications on file as of 10/28/2016.     Review of Systems  Constitutional: Positive for fatigue. Negative for activity change, appetite change, chills and fever.  Respiratory: Negative for cough, chest tightness, shortness of breath and wheezing.   Cardiovascular: Positive for leg swelling. Negative for chest pain and palpitations.  Gastrointestinal: Positive for abdominal distention. Negative for constipation, diarrhea, nausea and vomiting.       Ascites. Abdominal pain per HPI   Genitourinary: Negative for urgency.       Indwelling foley catheter  Musculoskeletal: Positive for gait problem.  Skin: Negative for pallor and rash.       Left leg redness  Psychiatric/Behavioral: Negative for agitation, confusion and sleep disturbance. The patient is not nervous/anxious.     Immunization History  Administered Date(s) Administered  . Influenza Whole 10/12/2011, 10/12/2012  . Influenza-Unspecified 10/23/2013, 10/10/2014, 12/12/2015, 10/21/2016  . PPD Test 07/07/2010, 02/08/2012, 06/29/2014, 03/24/2016  . Pneumococcal Conjugate-13 01/12/2003  . Zoster 01/12/2007   Pertinent  Health Maintenance Due  Topic Date Due  . MAMMOGRAM  08/11/2016  . PNA vac Low Risk Adult (2 of 2 - PPSV23) 11/29/2016 (Originally 01/12/2004)  . INFLUENZA VACCINE  Completed  . DEXA SCAN  Completed   Fall Risk  08/25/2016 11/11/2015 10/16/2015 07/03/2015 01/28/2015    Falls in the past year? No Yes No No No  Number falls in past yr: - 1 - - -  Injury with Fall? - Yes - - -  Risk Factor Category  - High Fall Risk - - -  Risk for fall due to : - History of fall(s);Impaired balance/gait;Impaired mobility - - -  Follow up - Falls evaluation completed - - -    Vitals:   10/28/16 0937  BP: 120/60  Pulse: 93  Resp: 18  Temp: (!) 97.5 F (36.4 C)  SpO2: 90%  Weight: 140 lb (63.5 kg)  Height: 5' (1.524 m)   Body mass index is 27.34 kg/m. Physical Exam  Constitutional:  Frail elderly in no acute distress  HENT:  Head: Normocephalic.  Mouth/Throat: Oropharynx is clear and moist. No oropharyngeal exudate.  Eyes: Pupils are equal, round, and reactive to light. Conjunctivae and EOM are normal. Right eye exhibits no discharge. Left eye exhibits  no discharge. No scleral icterus.  Neck: Normal range of motion. No JVD present. No thyromegaly present.  Cardiovascular: Normal rate, regular rhythm, normal heart sounds and intact distal pulses.  Exam reveals no gallop and no friction rub.   No murmur heard. Pulmonary/Chest: Effort normal and breath sounds normal. No respiratory distress. She has no wheezes. She has no rales.  Abdominal: Soft. Bowel sounds are normal. She exhibits distension. There is no tenderness. There is no rebound and no guarding.  Musculoskeletal:  Generalized weakness.unsteady gait requires assistance with transfer.  Bilateral lower extremities edema left greater than right.Left calf tender to palpation.    Lymphadenopathy:    She has no cervical adenopathy.  Neurological: Coordination normal.  Alert and oriented to person and place but disoriented to time.   Skin: Skin is warm and dry. No rash noted. No pallor.  Left leg redness from ankle area to below knee. Skin warm to touch with slight tenderness to calf.   Psychiatric: She has a normal mood and affect.   Labs reviewed:  Recent Labs  02/18/16 1204  05/20/16 1658   08/16/16 1441  09/14/16 1326 09/27/16 10/07/16 10/11/16 1442  NA 129*  < > 134*  < > 131*  < > 130* 127* 128 123*  K 4.2  < > 4.1  < > 4.0  < > 4.1 4.3 4.6 4.8  CL 98*  --  100*  --   --   --   --   --  94  --   CO2 23  < > 28  < > 28  --  27  --  19 23  GLUCOSE 108*  < > 111*  < > 147*  --  140  --   --  121  BUN 13  < > 15  < > 19.1  < > 12.9 12 13  12.3  CREATININE 0.71  < > 0.81  < > 1.0  < > 0.9 0.8 0.8 0.8  CALCIUM 8.9  < > 7.6*  < > 8.4  --  8.3*  --  7.6 8.0*  < > = values in this interval not displayed.  Recent Labs  09/14/16 1326 09/27/16 10/07/16 10/11/16 1442  AST 63* 46* 18 54*  ALT 30 19 48 21  ALKPHOS 300* 226* 232 255*  BILITOT 0.62  --  0.9 1.07  PROT 5.4*  --  5.2 5.3*  ALBUMIN 2.6*  --  2.8 2.4*    Recent Labs  08/16/16 1441 09/14/16 1326 09/27/16 10/11/16 1442  WBC 8.0 7.2 8.7 8.4  NEUTROABS 4.5 3.1  --  5.3  HGB 10.6* 11.1* 10.5* 11.8  HCT 32.8* 33.6* 32* 35.6  MCV 90.4 90.8  --  91.9  PLT 178 216 209 209   Lab Results  Component Value Date   TSH 3.215 08/25/2015   Lab Results  Component Value Date   HGBA1C 5.9 01/23/2015   Significant Diagnostic Results in last 30 days:  Dg Chest 2 View  Result Date: 10/11/2016 CLINICAL DATA:  Breast cancer possible lung Mets short of breath EXAM: CHEST  2 VIEW COMPARISON:  08/23/2016, PET-CT 08/09/2016, thoracic spine radiograph 03/11/2016 FINDINGS: Moderate right pleural effusion and trace left pleural effusion. Atelectasis or infiltrate at the right lung base. Stable cardiomediastinal silhouette with aortic atherosclerosis. Postsurgical changes at the right axilla. Old appearing bilateral rib deformities. Scarring at the left lung apex. Stable moderate severe compression of lower thoracic vertebra. IMPRESSION: 1. Moderate right pleural effusion, increased  compared to prior with worsened atelectasis or consolidation at the right lung base. 2. Trace left pleural effusion Electronically Signed   By: Donavan Foil  M.D.   On: 10/11/2016 14:49   Ct Chest W Contrast  Result Date: 10/12/2016 CLINICAL DATA:  83 year old female with history of metastatic breast cancer the adrenal glands and bones. Followup study. EXAM: CT CHEST, ABDOMEN, AND PELVIS WITH CONTRAST TECHNIQUE: Multidetector CT imaging of the chest, abdomen and pelvis was performed following the standard protocol during bolus administration of intravenous contrast. CONTRAST:  15mL ISOVUE-300 IOPAMIDOL (ISOVUE-300) INJECTION 61% COMPARISON:  PET-CT 08/09/2016. CT the chest, abdomen and pelvis 02/18/2016. FINDINGS: CT CHEST FINDINGS Cardiovascular: Heart size is normal. There is no significant pericardial fluid, thickening or pericardial calcification. There is aortic atherosclerosis, as well as atherosclerosis of the great vessels of the mediastinum and the coronary arteries, including calcified atherosclerotic plaque in the left main and right coronary arteries. Mediastinum/Nodes: No pathologically enlarged mediastinal, hilar or internal mammary lymph nodes. Esophagus is unremarkable in appearance. Numerous prominent but nonenlarged enhancing left axillary lymph nodes, nonspecific but similar in size to prior PET-CT 08/09/2016, at which time these nonenlarged lymph nodes were hypermetabolic. Status post right axillary lymph node dissection. Lungs/Pleura: Large chronic right pleural effusion with extensive passive subsegmental atelectasis in the dependent portions of the right lung. Extensive chronic scarring in the right middle lobe where there is also cylindrical and varicose bronchiectasis. Persistent masslike partially calcified soft tissue thickening in the right apex, similar to prior examinations, most compatible with chronic post infectious or inflammatory scarring. Similar plaque-like scarring is also noted in the apex of the left upper lobe as well. No definite new suspicious appearing pulmonary nodules or masses are noted. Trace left pleural effusion is  new compared to the prior examination lying dependently. Musculoskeletal: Innumerable sclerotic lesions are noted throughout the visualized axial and appendicular skeleton, compatible with widespread metastatic disease to the bones. Old pathologic compression fracture of T11 with 90% loss of central vertebral body height is unchanged. Multiple healed and healing nondisplaced pathologic rib fractures bilaterally. CT ABDOMEN PELVIS FINDINGS Hepatobiliary: Liver has a slightly shrunken appearance and nodular contour, suggesting underlying cirrhosis. No discrete cystic or solid hepatic lesions. No intra or extrahepatic biliary ductal dilatation. Gallbladder is moderately distended. No definite findings to strongly suggest an acute cholecystitis at this time (assessment is limited by large volume of ascites). Pancreas: No pancreatic mass. No pancreatic ductal dilatation. No well-defined pancreatic or peripancreatic fluid collections. Spleen: Unremarkable. Adrenals/Urinary Tract: 2.0 x 1.0 cm left adrenal nodule is stable dating back to 02/18/2016. Increasing mass-like thickening of the right adrenal gland, which currently measures 4.3 x 1.6 cm, presumably from an enlarging metastatic lesion. Mild bilateral hydronephrosis, similar to prior examinations. The appearance of the kidneys is otherwise unremarkable. Ureters do not appear dilated. Urinary bladder is completely decompressed with an indwelling Foley catheter in place. Stomach/Bowel: The appearance of the stomach is normal. There is no pathologic dilatation of small bowel or colon. The appendix is not confidently identified and may be surgically absent. Regardless, there are no inflammatory changes noted adjacent to the cecum to suggest the presence of an acute appendicitis at this time. Vascular/Lymphatic: Aortic atherosclerosis, without evidence of aneurysm or dissection in the abdominal or pelvic vasculature. No lymphadenopathy noted in the abdomen or pelvis.  Reproductive: Uterus and ovaries are atrophic. Other: Moderate to large volume of ascites.  No pneumoperitoneum. Musculoskeletal: Innumerable sclerotic lesions are again noted throughout the visualized axial and appendicular  skeleton, compatible with widespread metastatic disease to the bone. Status post right hip arthroplasty. IMPRESSION: 1. Widespread metastatic disease to the bone appears similar to prior examinations. There continues to be bilateral adrenal lesions, with the lesion in the left adrenal gland stable compared to prior studies, but increasing mass-like enlargement of the right adrenal gland, suggesting an enlarging metastatic lesion. Previously noted nonenlarged left axillary lymphadenopathy is stable in size, demonstrating avid enhancement (these lymph nodes were hypermetabolic on prior PET-CT). 2. Increasing moderate to large volume of ascites. 3. Stable large right pleural effusion. New trace left pleural effusion lying dependently. Areas of associated passive subsegmental atelectasis are noted throughout the right lung. Additional areas of scarring are also noted in the lungs bilaterally, as above. 4. Aortic atherosclerosis, in addition to 2 vessel coronary artery disease. 5. Additional incidental findings, as above. 6. No new sites of metastatic disease noted elsewhere in the chest, abdomen or pelvis. Aortic Atherosclerosis (ICD10-I70.0). Electronically Signed   By: Vinnie Langton M.D.   On: 10/12/2016 14:27   Ct Abdomen Pelvis W Contrast  Result Date: 10/12/2016 CLINICAL DATA:  83 year old female with history of metastatic breast cancer the adrenal glands and bones. Followup study. EXAM: CT CHEST, ABDOMEN, AND PELVIS WITH CONTRAST TECHNIQUE: Multidetector CT imaging of the chest, abdomen and pelvis was performed following the standard protocol during bolus administration of intravenous contrast. CONTRAST:  138mL ISOVUE-300 IOPAMIDOL (ISOVUE-300) INJECTION 61% COMPARISON:  PET-CT  08/09/2016. CT the chest, abdomen and pelvis 02/18/2016. FINDINGS: CT CHEST FINDINGS Cardiovascular: Heart size is normal. There is no significant pericardial fluid, thickening or pericardial calcification. There is aortic atherosclerosis, as well as atherosclerosis of the great vessels of the mediastinum and the coronary arteries, including calcified atherosclerotic plaque in the left main and right coronary arteries. Mediastinum/Nodes: No pathologically enlarged mediastinal, hilar or internal mammary lymph nodes. Esophagus is unremarkable in appearance. Numerous prominent but nonenlarged enhancing left axillary lymph nodes, nonspecific but similar in size to prior PET-CT 08/09/2016, at which time these nonenlarged lymph nodes were hypermetabolic. Status post right axillary lymph node dissection. Lungs/Pleura: Large chronic right pleural effusion with extensive passive subsegmental atelectasis in the dependent portions of the right lung. Extensive chronic scarring in the right middle lobe where there is also cylindrical and varicose bronchiectasis. Persistent masslike partially calcified soft tissue thickening in the right apex, similar to prior examinations, most compatible with chronic post infectious or inflammatory scarring. Similar plaque-like scarring is also noted in the apex of the left upper lobe as well. No definite new suspicious appearing pulmonary nodules or masses are noted. Trace left pleural effusion is new compared to the prior examination lying dependently. Musculoskeletal: Innumerable sclerotic lesions are noted throughout the visualized axial and appendicular skeleton, compatible with widespread metastatic disease to the bones. Old pathologic compression fracture of T11 with 90% loss of central vertebral body height is unchanged. Multiple healed and healing nondisplaced pathologic rib fractures bilaterally. CT ABDOMEN PELVIS FINDINGS Hepatobiliary: Liver has a slightly shrunken appearance and  nodular contour, suggesting underlying cirrhosis. No discrete cystic or solid hepatic lesions. No intra or extrahepatic biliary ductal dilatation. Gallbladder is moderately distended. No definite findings to strongly suggest an acute cholecystitis at this time (assessment is limited by large volume of ascites). Pancreas: No pancreatic mass. No pancreatic ductal dilatation. No well-defined pancreatic or peripancreatic fluid collections. Spleen: Unremarkable. Adrenals/Urinary Tract: 2.0 x 1.0 cm left adrenal nodule is stable dating back to 02/18/2016. Increasing mass-like thickening of the right adrenal gland, which currently measures  4.3 x 1.6 cm, presumably from an enlarging metastatic lesion. Mild bilateral hydronephrosis, similar to prior examinations. The appearance of the kidneys is otherwise unremarkable. Ureters do not appear dilated. Urinary bladder is completely decompressed with an indwelling Foley catheter in place. Stomach/Bowel: The appearance of the stomach is normal. There is no pathologic dilatation of small bowel or colon. The appendix is not confidently identified and may be surgically absent. Regardless, there are no inflammatory changes noted adjacent to the cecum to suggest the presence of an acute appendicitis at this time. Vascular/Lymphatic: Aortic atherosclerosis, without evidence of aneurysm or dissection in the abdominal or pelvic vasculature. No lymphadenopathy noted in the abdomen or pelvis. Reproductive: Uterus and ovaries are atrophic. Other: Moderate to large volume of ascites.  No pneumoperitoneum. Musculoskeletal: Innumerable sclerotic lesions are again noted throughout the visualized axial and appendicular skeleton, compatible with widespread metastatic disease to the bone. Status post right hip arthroplasty. IMPRESSION: 1. Widespread metastatic disease to the bone appears similar to prior examinations. There continues to be bilateral adrenal lesions, with the lesion in the left  adrenal gland stable compared to prior studies, but increasing mass-like enlargement of the right adrenal gland, suggesting an enlarging metastatic lesion. Previously noted nonenlarged left axillary lymphadenopathy is stable in size, demonstrating avid enhancement (these lymph nodes were hypermetabolic on prior PET-CT). 2. Increasing moderate to large volume of ascites. 3. Stable large right pleural effusion. New trace left pleural effusion lying dependently. Areas of associated passive subsegmental atelectasis are noted throughout the right lung. Additional areas of scarring are also noted in the lungs bilaterally, as above. 4. Aortic atherosclerosis, in addition to 2 vessel coronary artery disease. 5. Additional incidental findings, as above. 6. No new sites of metastatic disease noted elsewhere in the chest, abdomen or pelvis. Aortic Atherosclerosis (ICD10-I70.0). Electronically Signed   By: Vinnie Langton M.D.   On: 10/12/2016 14:27   Assessment/Plan 1. Cellulitis of left lower extremity Afebrile.leg redness from ankle area to below knee. Skin warm to touch with slight tenderness to calf.Discussed findings with patient's daughter at bedside and other POA also notified over the phone by Nurse supervisor with provider present. Will treat with doxycycline 100 mg Tablet twice daily x 10 days and Florastor 250 mg capsule twice daily x 10 days.Hold Trimethoprim 100 mg tablet until doxycycline is completed.    2. Leg edema, left Left leg worse than right, warm to touch with slight tenderness to calf muscle. Suspicious for DVT.Results discussed with patient's daughter as above would like doppler U/S done to rule out DVT. Continue with Furosemide 40 mg Tablet twice daily.  Family/ staff Communication: Reviewed plan of care with patient, patient's daughter and facility Nurse.  Labs/tests ordered: Venous doppler U/S rule out DVT Sandrea Hughs, NP

## 2016-10-29 DIAGNOSIS — R188 Other ascites: Secondary | ICD-10-CM | POA: Diagnosis not present

## 2016-10-29 DIAGNOSIS — C7800 Secondary malignant neoplasm of unspecified lung: Secondary | ICD-10-CM | POA: Diagnosis not present

## 2016-10-29 DIAGNOSIS — C7970 Secondary malignant neoplasm of unspecified adrenal gland: Secondary | ICD-10-CM | POA: Diagnosis not present

## 2016-10-29 DIAGNOSIS — C50919 Malignant neoplasm of unspecified site of unspecified female breast: Secondary | ICD-10-CM | POA: Diagnosis not present

## 2016-10-29 DIAGNOSIS — C7951 Secondary malignant neoplasm of bone: Secondary | ICD-10-CM | POA: Diagnosis not present

## 2016-10-29 DIAGNOSIS — J910 Malignant pleural effusion: Secondary | ICD-10-CM | POA: Diagnosis not present

## 2016-10-29 NOTE — Telephone Encounter (Signed)
No entry 

## 2016-11-01 ENCOUNTER — Non-Acute Institutional Stay (SKILLED_NURSING_FACILITY): Payer: Medicare Other | Admitting: Family

## 2016-11-01 ENCOUNTER — Encounter: Payer: Self-pay | Admitting: Family

## 2016-11-01 DIAGNOSIS — L089 Local infection of the skin and subcutaneous tissue, unspecified: Secondary | ICD-10-CM | POA: Diagnosis not present

## 2016-11-01 DIAGNOSIS — R180 Malignant ascites: Secondary | ICD-10-CM | POA: Diagnosis not present

## 2016-11-01 DIAGNOSIS — S90822A Blister (nonthermal), left foot, initial encounter: Secondary | ICD-10-CM | POA: Diagnosis not present

## 2016-11-01 DIAGNOSIS — R609 Edema, unspecified: Secondary | ICD-10-CM

## 2016-11-01 NOTE — Progress Notes (Signed)
Location:  [facility] Room Number: [place] of Service:  SNF (31) Provider: Rhapsody Wolven FNP-C  Blanchie Serve, MD  Patient Care Team: Blanchie Serve, MD as PCP - General (Internal Medicine) Melina Modena, [facility] Shon Hough, MD as Consulting Physician (Ophthalmology) Molli Posey, MD as Consulting Physician (Obstetrics and Gynecology) Gerarda Fraction, MD as Consulting Physician (Ophthalmology) Lorelle Gibbs, MD as Consulting Physician (Radiology) Melrose Nakayama, MD as Consulting Physician (Orthopedic Surgery) Gerarda Fraction, MD as Referring Physician (Ophthalmology) Ardis Hughs, MD as Attending Physician (Urology) Magrinat, Virgie Dad, MD as Consulting Physician (Oncology) Aleina Burgio, Nelda Bucks, NP as Nurse Practitioner (Family Medicine)  Extended Emergency Contact Information Primary Emergency Contact: Black,Emily Address: [address]          [place], [place] [address] [name] of [place] Phone: [phone] Mobile Phone: [phone] Relation: Daughter Secondary Emergency Contact: Marcine Matar [place] of [place] Mobile Phone: [phone] Relation: Daughter  Code Status:  DNR Goals of care: Advanced Directive information Advanced Directives 11/01/2016  Does Patient Have a Medical Advance Directive? Yes  Type of Advance Directive Out of facility DNR (pink MOST or yellow form);Living will;Healthcare Power of Attorney  Does patient want to make changes to medical advance directive? -  Copy of [place] in Chart? Yes  Would patient like information on creating a medical advance directive? -  Pre-existing out of facility DNR order (yellow form or pink MOST form) Yellow form placed in chart (order not valid for inpatient use)     Chief Complaint  Patient presents with  . Acute Visit    blister on left heel    HPI:  Pt is a 83 y.o. female seen today at [facility] for an acute visit for  evaluation of left heel blister.she has a medical history of HTN,metastatic breast cancer, Ascites among other conditions. She is seen in her room today sitting on recliner. She denies any new acute issues this visit. Facility Nurse reports patient has a blister on her left heel. Patient states didn't know I had a blister " it doesn't both me" she has significant leg edema and ascites. Recently left leg cellulitis has improved.Her weight log reviewed 10/14/2016 wt 122.6 lbs; 10/20/2016 wt 135.5 lbs; 10/27/2016 wt 140 lbs and 11/01/2016 wt 135.5 lbs.  Past Medical History:  Diagnosis Date  . Abnormal PET scan of mediastinum 11/11/2015  . Abnormalities of the hair 02/29/2012  . Arthritis   . BPV (benign positional vertigo)   . Cancer ([place])   . Cancer antigen 125 (CA 125) elevation 11/27/2013  . Candidiasis of skin and nails 03/19/2011  . 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 Chorioretinal scar, macular 01/23/2015  . Closed fracture of base of neck of femur ([place]) 03/18/2011  . Compression fracture of thoracic vertebra ([facility]) 11/24/2012  . Contact dermatitis and other eczema, due to unspecified cause 05/07/2011  . Depression 07/23/2014  . Diverticulosis of colon (without mention of hemorrhage) 03/19/2011  . Edema 05/07/2011  . HOH (hard of hearing)   . Hypertension 02/24/2016  . Hypopotassemia 03/26/2011  . Hyposmolality and/or hyponatremia 03/19/2011  . Hypotension, unspecified 03/18/2011  . IBS (irritable bowel syndrome) 11/27/2013  . Idiopathic scoliosis 01/27/2016  . Impaired fasting glucose 03/19/2011  . Insomnia, unspecified 03/19/2011  . Kidney infection   . Lumbago 03/19/2011  . Macular degeneration   . Malignant neoplasm of breast (female), unspecified site 10/28/2002  . Mass of right lung 10/23/2015  . Metastasis to adrenal gland (  [place]) 11/21/2015   Breast cancer  . Metastatic breast cancer ([place]) 10/27/2004  . Nonexudative age-related macular degeneration 12/10/2010  . Osteoporosis  08/06/2014  . Osteoporosis, unspecified 03/19/2011  . Other malaise and fatigue 03/19/2011  . Pain in joint, pelvic region and thigh 03/19/2011  . Primary osteoarthritis of right hip 05/28/2014   TOTAL HIP ARTHROPLASTY ANTERIOR APPROACH HARDWARE REMOVAL on 05/28/2014   . Purpura senilis ([place]) 07/31/2013  . Radiculopathy, cervical 03/23/2016   Right C6  . Rib fractures   . Right bundle branch block 08/30/2011  . Shortness of breath   . Spasm of muscle 03/19/2011  . Spinal stenosis, lumbar region, with neurogenic claudication 11/02/2011  . TMJ click 1/91/4782  . Trigger finger (acquired) 11/02/2011  . Unspecified constipation 03/19/2011  . Urinary frequency 04/24/2013  . UTI (urinary tract infection) 04/16/2013   04/16/13 pseudomonas aeruginosa: tx with Cipro 06/15/14 P. Aeruginosa [name] 500mg  IM q12h x 7 days.     . Vaginitis and vulvovaginitis 02/29/2012  . Vertigo 08/26/2015   Past Surgical History:  Procedure Laterality Date  . BREAST SURGERY Bilateral 2005-10/27/2004   lumpectomy- Streck,MD  . CATARACT EXTRACTION  2010   bialteral  . EYE SURGERY     cataract  . FRACTURE SURGERY Left 09/1980   ankle  . HARDWARE REMOVAL Right 05/28/2014   Procedure: HARDWARE REMOVAL;  Surgeon: Melrose Nakayama, MD;  Location: [facility];  Service: Orthopedics;  Laterality: Right;  . HIP PINNING,CANNULATED  03/08/2011   Procedure: CANNULATED HIP PINNING;  Surgeon: Johnn Hai, MD;  Location: [facility];  Service: Orthopedics;  Laterality: Right;  . IR THORACENTESIS ASP PLEURAL SPACE W/IMG GUIDE  08/23/2016  . ORIF HIP FRACTURE Right 03/08/2011   Bean,MD  . SKIN CANCER EXCISION Left 10/12/2012   lower leg Dr. [place]  . SKIN LESION EXCISION Right 02/04/2011   Abdomen lesion spongiotic dermatitis-Taffeen, MD  . SQUAMOUS CELL CARCINOMA EXCISION Right 02/04/2011   forearmSyble Creek, MD  . TONSILLECTOMY    . TOTAL HIP ARTHROPLASTY Right 05/28/2014   Procedure: TOTAL HIP ARTHROPLASTY ANTERIOR APPROACH;  Surgeon:  Melrose Nakayama, MD;  Location: [facility];  Service: Orthopedics;  Laterality: Right;    Allergies  Allergen Reactions  . Macrodantin [Nitrofurantoin] Other (See Comments)    Reaction:  Unknown   . Nystatin Swelling    Outpatient Encounter Prescriptions as of 11/01/2016  Medication Sig  . acetaminophen (TYLENOL) 500 MG tablet Take 1,000 mg by mouth every 6 (six) hours as needed for moderate pain.   Marland Kitchen ALPRAZolam (XANAX) 0.5 MG tablet Take 0.5 mg by mouth daily. In addition to scheduled daily dose pt may take 2 x day prn  . antiseptic oral rinse (BIOTENE) LIQD 15 mLs by Mouth Rinse route every 6 (six) hours as needed for dry mouth.   . Diphenhyd-Benzeth-Menth-Zn Ace (CALAGEL MAXIMUM STRENGTH EX) Apply 1 application topically as directed. 2 x day  . doxycycline (VIBRA-TABS) 100 MG tablet Take 100 mg by mouth 2 (two) times daily.  . furosemide (LASIX) 40 MG tablet Take 40 mg by mouth 2 (two) times daily. Hold if SBP <110  . guaiFENesin (MUCINEX) 600 MG 12 hr tablet Take 600 mg by mouth 2 (two) times daily as needed for cough.  . hydroxypropyl methylcellulose / hypromellose (ISOPTO TEARS / GONIOVISC) 2.5 % ophthalmic solution Place 1 drop into both eyes 3 (three) times daily.  . meclizine (ANTIVERT) 25 MG tablet Take 1 tablet (25 mg total) by mouth 3 (three) times daily as needed  for dizziness.  . mirtazapine (REMERON) 7.5 MG tablet Take 7.5 mg by mouth at bedtime.  . NON FORMULARY daily as needed. 1 gatorade bottle   . omeprazole (PRILOSEC) 40 MG capsule Take 40 mg by mouth daily.  . ondansetron (ZOFRAN-ODT) 4 MG disintegrating tablet Take 4 mg by mouth daily as needed for nausea or vomiting.   . OXYGEN Inhale 2 L into the lungs continuous.  . phenazopyridine (PYRIDIUM) 100 MG tablet Take 100 mg by mouth 3 (three) times daily as needed for pain. No more than 3 consecutive days  . polyethylene glycol (MIRALAX / GLYCOLAX) packet Take 17 g by mouth daily. Pt also uses once daily as needed for  constipation.  . potassium chloride SA (K-DUR,KLOR-CON) 20 MEQ tablet Take 20 mEq by mouth daily.  Marland Kitchen saccharomyces boulardii (FLORASTOR) 250 MG capsule Take 250 mg by mouth 2 (two) times daily.  . traMADol (ULTRAM) 50 MG tablet Take 50 mg by mouth 4 (four) times daily -  with meals and at bedtime.   Marland Kitchen trimethoprim (TRIMPEX) 100 MG tablet Take 100 mg by mouth at bedtime.  Marland Kitchen ZINC OXIDE EX Apply 1 application topically 2 (two) times daily. And as needed after peri-care.   No facility-administered encounter medications on file as of 11/01/2016.     Review of Systems  Constitutional: Positive for fatigue. Negative for activity change, appetite change, chills and fever.  Respiratory: Negative for cough, chest tightness and wheezing.        Short of breath with excursion.   Cardiovascular: Positive for leg swelling. Negative for chest pain and palpitations.  Gastrointestinal: Positive for abdominal distention. Negative for abdominal pain, constipation, diarrhea, nausea and vomiting.  Genitourinary: Negative for urgency.       Indwelling foley catheter  Musculoskeletal: Positive for gait problem.  Skin: Negative for color change, pallor and rash.  Neurological: Negative for dizziness, syncope, light-headedness and headaches.  Hematological: Does not bruise/bleed easily.  Psychiatric/Behavioral: Negative for agitation and confusion. The patient is not nervous/anxious.     Immunization History  Administered Date(s) Administered  . Influenza Whole 10/12/2011, 10/12/2012  . Influenza-Unspecified 10/23/2013, 10/10/2014, 12/12/2015, 10/21/2016  . PPD Test 07/07/2010, 02/08/2012, 06/29/2014, 03/24/2016  . Pneumococcal Conjugate-13 01/12/2003  . Zoster 01/12/2007   Pertinent  Health Maintenance Due  Topic Date Due  . MAMMOGRAM  08/11/2016  . PNA vac Low Risk Adult (2 of 2 - PPSV23) 2016/11/16 (Originally 01/12/2004)  . INFLUENZA VACCINE  Completed  . DEXA SCAN  Completed   Fall Risk  08/25/2016  11/11/2015 10/16/2015 07/03/2015 01/28/2015  Falls in the past year? No Yes No No No  Number falls in past yr: - 1 - - -  Injury with Fall? - Yes - - -  Risk Factor Category  - High Fall Risk - - -  Risk for fall due to : - History of fall(s);Impaired balance/gait;Impaired mobility - - -  Follow up - Falls evaluation completed - - -   Functional Status Survey:    Vitals:   11/01/16 1150  BP: 102/62  Pulse: 96  Resp: 16  Temp: 98.2 F (36.8 C)  SpO2: 95%  Weight: 135 lb 6.4 oz (61.4 kg)  Height: 5' (1.524 m)   Body mass index is 26.44 kg/m. Physical Exam  Constitutional: She is oriented to person, place, and time.  Frail elderly in no acute distress   HENT:  Head: Normocephalic.  Mouth/Throat: Oropharynx is clear and moist. No oropharyngeal exudate.  Eyes: Pupils are  equal, round, and reactive to light. Conjunctivae and EOM are normal. Right eye exhibits no discharge. Left eye exhibits no discharge. No scleral icterus.  Neck: Normal range of motion. No JVD present. No thyromegaly present.  Cardiovascular: Normal rate, regular rhythm, normal heart sounds and intact distal pulses.  Exam reveals no gallop and no friction rub.   No murmur heard. Pulmonary/Chest: Effort normal and breath sounds normal. No respiratory distress. She has no wheezes. She has no rales.  Abdominal: Soft. Bowel sounds are normal. She exhibits no distension. There is no tenderness. There is no rebound and no guarding.  Genitourinary:  Genitourinary Comments: Indwelling foley catheter  Musculoskeletal:  Unsteady gait. Generalized weakness. Bilateral lower extremities 2-3 + edema.   Lymphadenopathy:    She has no cervical adenopathy.  Neurological: She is oriented to person, place, and time. Coordination normal.  Skin: Skin is warm and dry. No rash noted. No pallor.  Left leg skin redness has improved.  Left heel blister non-tender to touch.   Psychiatric: She has a normal mood and affect.    Labs  reviewed:  Recent Labs  02/18/16 1204  05/20/16 1658  08/16/16 1441  09/14/16 1326 09/27/16 10/07/16 10/11/16 1442  NA 129*  < > 134*  < > 131*  < > 130* 127* 128 123*  K 4.2  < > 4.1  < > 4.0  < > 4.1 4.3 4.6 4.8  CL 98*  --  100*  --   --   --   --   --  94  --   CO2 23  < > 28  < > 28  --  27  --  19 23  GLUCOSE 108*  < > 111*  < > 147*  --  140  --   --  121  BUN 13  < > 15  < > 19.1  < > 12.9 12 13  12.3  CREATININE 0.71  < > 0.81  < > 1.0  < > 0.9 0.8 0.8 0.8  CALCIUM 8.9  < > 7.6*  < > 8.4  --  8.3*  --  7.6 8.0*  < > = values in this interval not displayed.  Recent Labs  09/14/16 1326 09/27/16 10/07/16 10/11/16 1442  AST 63* 46* 18 54*  ALT 30 19 48 21  ALKPHOS 300* 226* 232 255*  BILITOT 0.62  --  0.9 1.07  PROT 5.4*  --  5.2 5.3*  ALBUMIN 2.6*  --  2.8 2.4*    Recent Labs  08/16/16 1441 09/14/16 1326 09/27/16 10/11/16 1442  WBC 8.0 7.2 8.7 8.4  NEUTROABS 4.5 3.1  --  5.3  HGB 10.6* 11.1* 10.5* 11.8  HCT 32.8* 33.6* 32* 35.6  MCV 90.4 90.8  --  91.9  PLT 178 216 209 209   Lab Results  Component Value Date   TSH 3.215 08/25/2015   Lab Results  Component Value Date   HGBA1C 5.9 01/23/2015   Significant Diagnostic Results in last 30 days:  Dg Chest 2 View  Result Date: 10/11/2016 CLINICAL DATA:  Breast cancer possible lung Mets short of breath EXAM: CHEST  2 VIEW COMPARISON:  08/23/2016, PET-CT 08/09/2016, thoracic spine radiograph 03/11/2016 FINDINGS: Moderate right pleural effusion and trace left pleural effusion. Atelectasis or infiltrate at the right lung base. Stable cardiomediastinal silhouette with aortic atherosclerosis. Postsurgical changes at the right axilla. Old appearing bilateral rib deformities. Scarring at the left lung apex. Stable moderate severe compression of lower thoracic  vertebra. IMPRESSION: 1. Moderate right pleural effusion, increased compared to prior with worsened atelectasis or consolidation at the right lung base. 2. Trace left  pleural effusion Electronically Signed   By: Donavan Foil M.D.   On: 10/11/2016 14:49   Ct Chest W Contrast  Result Date: 10/12/2016 CLINICAL DATA:  83 year old female with history of metastatic breast cancer the adrenal glands and bones. Followup study. EXAM: CT CHEST, ABDOMEN, AND PELVIS WITH CONTRAST TECHNIQUE: Multidetector CT imaging of the chest, abdomen and pelvis was performed following the standard protocol during bolus administration of intravenous contrast. CONTRAST:  135mL ISOVUE-300 IOPAMIDOL (ISOVUE-300) INJECTION 61% COMPARISON:  PET-CT 08/09/2016. CT the chest, abdomen and pelvis 02/18/2016. FINDINGS: CT CHEST FINDINGS Cardiovascular: Heart size is normal. There is no significant pericardial fluid, thickening or pericardial calcification. There is aortic atherosclerosis, as well as atherosclerosis of the great vessels of the mediastinum and the coronary arteries, including calcified atherosclerotic plaque in the left main and right coronary arteries. Mediastinum/Nodes: No pathologically enlarged mediastinal, hilar or internal mammary lymph nodes. Esophagus is unremarkable in appearance. Numerous prominent but nonenlarged enhancing left axillary lymph nodes, nonspecific but similar in size to prior PET-CT 08/09/2016, at which time these nonenlarged lymph nodes were hypermetabolic. Status post right axillary lymph node dissection. Lungs/Pleura: Large chronic right pleural effusion with extensive passive subsegmental atelectasis in the dependent portions of the right lung. Extensive chronic scarring in the right middle lobe where there is also cylindrical and varicose bronchiectasis. Persistent masslike partially calcified soft tissue thickening in the right apex, similar to prior examinations, most compatible with chronic post infectious or inflammatory scarring. Similar plaque-like scarring is also noted in the apex of the left upper lobe as well. No definite new suspicious appearing pulmonary  nodules or masses are noted. Trace left pleural effusion is new compared to the prior examination lying dependently. Musculoskeletal: Innumerable sclerotic lesions are noted throughout the visualized axial and appendicular skeleton, compatible with widespread metastatic disease to the bones. Old pathologic compression fracture of T11 with 90% loss of central vertebral body height is unchanged. Multiple healed and healing nondisplaced pathologic rib fractures bilaterally. CT ABDOMEN PELVIS FINDINGS Hepatobiliary: Liver has a slightly shrunken appearance and nodular contour, suggesting underlying cirrhosis. No discrete cystic or solid hepatic lesions. No intra or extrahepatic biliary ductal dilatation. Gallbladder is moderately distended. No definite findings to strongly suggest an acute cholecystitis at this time (assessment is limited by large volume of ascites). Pancreas: No pancreatic mass. No pancreatic ductal dilatation. No well-defined pancreatic or peripancreatic fluid collections. Spleen: Unremarkable. Adrenals/Urinary Tract: 2.0 x 1.0 cm left adrenal nodule is stable dating back to 02/18/2016. Increasing mass-like thickening of the right adrenal gland, which currently measures 4.3 x 1.6 cm, presumably from an enlarging metastatic lesion. Mild bilateral hydronephrosis, similar to prior examinations. The appearance of the kidneys is otherwise unremarkable. Ureters do not appear dilated. Urinary bladder is completely decompressed with an indwelling Foley catheter in place. Stomach/Bowel: The appearance of the stomach is normal. There is no pathologic dilatation of small bowel or colon. The appendix is not confidently identified and may be surgically absent. Regardless, there are no inflammatory changes noted adjacent to the cecum to suggest the presence of an acute appendicitis at this time. Vascular/Lymphatic: Aortic atherosclerosis, without evidence of aneurysm or dissection in the abdominal or pelvic  vasculature. No lymphadenopathy noted in the abdomen or pelvis. Reproductive: Uterus and ovaries are atrophic. Other: Moderate to large volume of ascites.  No pneumoperitoneum. Musculoskeletal: Innumerable sclerotic lesions are  again noted throughout the visualized axial and appendicular skeleton, compatible with widespread metastatic disease to the bone. Status post right hip arthroplasty. IMPRESSION: 1. Widespread metastatic disease to the bone appears similar to prior examinations. There continues to be bilateral adrenal lesions, with the lesion in the left adrenal gland stable compared to prior studies, but increasing mass-like enlargement of the right adrenal gland, suggesting an enlarging metastatic lesion. Previously noted nonenlarged left axillary lymphadenopathy is stable in size, demonstrating avid enhancement (these lymph nodes were hypermetabolic on prior PET-CT). 2. Increasing moderate to large volume of ascites. 3. Stable large right pleural effusion. New trace left pleural effusion lying dependently. Areas of associated passive subsegmental atelectasis are noted throughout the right lung. Additional areas of scarring are also noted in the lungs bilaterally, as above. 4. Aortic atherosclerosis, in addition to 2 vessel coronary artery disease. 5. Additional incidental findings, as above. 6. No new sites of metastatic disease noted elsewhere in the chest, abdomen or pelvis. Aortic Atherosclerosis (ICD10-I70.0). Electronically Signed   By: Vinnie Langton M.D.   On: 10/12/2016 14:27   Ct Abdomen Pelvis W Contrast  Result Date: 10/12/2016 CLINICAL DATA:  83 year old female with history of metastatic breast cancer the adrenal glands and bones. Followup study. EXAM: CT CHEST, ABDOMEN, AND PELVIS WITH CONTRAST TECHNIQUE: Multidetector CT imaging of the chest, abdomen and pelvis was performed following the standard protocol during bolus administration of intravenous contrast. CONTRAST:  134mL ISOVUE-300  IOPAMIDOL (ISOVUE-300) INJECTION 61% COMPARISON:  PET-CT 08/09/2016. CT the chest, abdomen and pelvis 02/18/2016. FINDINGS: CT CHEST FINDINGS Cardiovascular: Heart size is normal. There is no significant pericardial fluid, thickening or pericardial calcification. There is aortic atherosclerosis, as well as atherosclerosis of the great vessels of the mediastinum and the coronary arteries, including calcified atherosclerotic plaque in the left main and right coronary arteries. Mediastinum/Nodes: No pathologically enlarged mediastinal, hilar or internal mammary lymph nodes. Esophagus is unremarkable in appearance. Numerous prominent but nonenlarged enhancing left axillary lymph nodes, nonspecific but similar in size to prior PET-CT 08/09/2016, at which time these nonenlarged lymph nodes were hypermetabolic. Status post right axillary lymph node dissection. Lungs/Pleura: Large chronic right pleural effusion with extensive passive subsegmental atelectasis in the dependent portions of the right lung. Extensive chronic scarring in the right middle lobe where there is also cylindrical and varicose bronchiectasis. Persistent masslike partially calcified soft tissue thickening in the right apex, similar to prior examinations, most compatible with chronic post infectious or inflammatory scarring. Similar plaque-like scarring is also noted in the apex of the left upper lobe as well. No definite new suspicious appearing pulmonary nodules or masses are noted. Trace left pleural effusion is new compared to the prior examination lying dependently. Musculoskeletal: Innumerable sclerotic lesions are noted throughout the visualized axial and appendicular skeleton, compatible with widespread metastatic disease to the bones. Old pathologic compression fracture of T11 with 90% loss of central vertebral body height is unchanged. Multiple healed and healing nondisplaced pathologic rib fractures bilaterally. CT ABDOMEN PELVIS FINDINGS  Hepatobiliary: Liver has a slightly shrunken appearance and nodular contour, suggesting underlying cirrhosis. No discrete cystic or solid hepatic lesions. No intra or extrahepatic biliary ductal dilatation. Gallbladder is moderately distended. No definite findings to strongly suggest an acute cholecystitis at this time (assessment is limited by large volume of ascites). Pancreas: No pancreatic mass. No pancreatic ductal dilatation. No well-defined pancreatic or peripancreatic fluid collections. Spleen: Unremarkable. Adrenals/Urinary Tract: 2.0 x 1.0 cm left adrenal nodule is stable dating back to 02/18/2016. Increasing mass-like thickening  of the right adrenal gland, which currently measures 4.3 x 1.6 cm, presumably from an enlarging metastatic lesion. Mild bilateral hydronephrosis, similar to prior examinations. The appearance of the kidneys is otherwise unremarkable. Ureters do not appear dilated. Urinary bladder is completely decompressed with an indwelling Foley catheter in place. Stomach/Bowel: The appearance of the stomach is normal. There is no pathologic dilatation of small bowel or colon. The appendix is not confidently identified and may be surgically absent. Regardless, there are no inflammatory changes noted adjacent to the cecum to suggest the presence of an acute appendicitis at this time. Vascular/Lymphatic: Aortic atherosclerosis, without evidence of aneurysm or dissection in the abdominal or pelvic vasculature. No lymphadenopathy noted in the abdomen or pelvis. Reproductive: Uterus and ovaries are atrophic. Other: Moderate to large volume of ascites.  No pneumoperitoneum. Musculoskeletal: Innumerable sclerotic lesions are again noted throughout the visualized axial and appendicular skeleton, compatible with widespread metastatic disease to the bone. Status post right hip arthroplasty. IMPRESSION: 1. Widespread metastatic disease to the bone appears similar to prior examinations. There continues to  be bilateral adrenal lesions, with the lesion in the left adrenal gland stable compared to prior studies, but increasing mass-like enlargement of the right adrenal gland, suggesting an enlarging metastatic lesion. Previously noted nonenlarged left axillary lymphadenopathy is stable in size, demonstrating avid enhancement (these lymph nodes were hypermetabolic on prior PET-CT). 2. Increasing moderate to large volume of ascites. 3. Stable large right pleural effusion. New trace left pleural effusion lying dependently. Areas of associated passive subsegmental atelectasis are noted throughout the right lung. Additional areas of scarring are also noted in the lungs bilaterally, as above. 4. Aortic atherosclerosis, in addition to 2 vessel coronary artery disease. 5. Additional incidental findings, as above. 6. No new sites of metastatic disease noted elsewhere in the chest, abdomen or pelvis. Aortic Atherosclerosis (ICD10-I70.0). Electronically Signed   By: Vinnie Langton M.D.   On: 10/12/2016 14:27   Assessment/Plan   Blister of left heel with infection, initial encounter Cleanse blister area with saline,pat dry and cover with Allevyn dressing for protection and extra support. Elevate heels off bed surface while in bed.Patient to avoid wearing shoes until blister resolves. May wear non-skid hospital socks. Continue to monitor and notify provider if worsening or no improvement.   Edema  10/14/2016 wt 122.6 lbs;  10/20/2016 wt 135.5 lbs;  10/27/2016 wt 140 lbs 11/01/2016 wt 135.5 lbs. Bilateral lower extremities 2-3 + with ascites.increase furosemide 40 mg tablet to one and a half tablet ( 60 mg) in the morning and 40 mg tablet at 4 PM. Continue on KCl supplements.continue to monitor weight.continue to follow up with hospice service.   Family/ staff Communication: Reviewed plan of care with patient and facility Nurse.   Labs/tests ordered: None   Berk Pilot C Bertil Brickey, NP

## 2016-11-02 DIAGNOSIS — J910 Malignant pleural effusion: Secondary | ICD-10-CM | POA: Diagnosis not present

## 2016-11-02 DIAGNOSIS — C50919 Malignant neoplasm of unspecified site of unspecified female breast: Secondary | ICD-10-CM | POA: Diagnosis not present

## 2016-11-02 DIAGNOSIS — R188 Other ascites: Secondary | ICD-10-CM | POA: Diagnosis not present

## 2016-11-02 DIAGNOSIS — C7970 Secondary malignant neoplasm of unspecified adrenal gland: Secondary | ICD-10-CM | POA: Diagnosis not present

## 2016-11-02 DIAGNOSIS — C7800 Secondary malignant neoplasm of unspecified lung: Secondary | ICD-10-CM | POA: Diagnosis not present

## 2016-11-02 DIAGNOSIS — C7951 Secondary malignant neoplasm of bone: Secondary | ICD-10-CM | POA: Diagnosis not present

## 2016-11-08 ENCOUNTER — Other Ambulatory Visit: Payer: Medicare Other

## 2016-11-08 ENCOUNTER — Non-Acute Institutional Stay (SKILLED_NURSING_FACILITY): Payer: Medicare Other | Admitting: Internal Medicine

## 2016-11-08 ENCOUNTER — Ambulatory Visit: Payer: Medicare Other

## 2016-11-08 ENCOUNTER — Encounter: Payer: Self-pay | Admitting: Internal Medicine

## 2016-11-08 DIAGNOSIS — R601 Generalized edema: Secondary | ICD-10-CM | POA: Diagnosis not present

## 2016-11-08 DIAGNOSIS — L89150 Pressure ulcer of sacral region, unstageable: Secondary | ICD-10-CM | POA: Insufficient documentation

## 2016-11-08 NOTE — Progress Notes (Signed)
Location:  [place] Room Number: [place] of Service:  SNF ([phone]) Provider:  Blanchie Serve, MD  Blanchie Serve, MD  Patient Care Team: Blanchie Serve, MD as PCP - General (Internal Medicine) Melina Modena, [facility] Shon Hough, MD as Consulting Physician (Ophthalmology) Molli Posey, MD as Consulting Physician (Obstetrics and Gynecology) Gerarda Fraction, MD as Consulting Physician (Ophthalmology) Lorelle Gibbs, MD as Consulting Physician (Radiology) Melrose Nakayama, MD as Consulting Physician (Orthopedic Surgery) Gerarda Fraction, MD as Referring Physician (Ophthalmology) Ardis Hughs, MD as Attending Physician (Urology) Magrinat, Virgie Dad, MD as Consulting Physician (Oncology) Ngetich, Nelda Bucks, NP as Nurse Practitioner (Family Medicine)  Extended Emergency Contact Information Primary Emergency Contact: Black,Emily Address: [address]          [place], [place] [address] [name] of [place] Phone: [phone] Mobile Phone: [phone] Relation: Daughter Secondary Emergency Contact: Marcine Matar [place] of [place] Mobile Phone: [phone] Relation: Daughter  Code Status:  DNR  Goals of care: Advanced Directive information Advanced Directives 11/08/2016  Does Patient Have a Medical Advance Directive? Yes  Type of Paramedic of [place];Living will;Out of facility DNR (pink MOST or yellow form)  Does patient want to make changes to medical advance directive? No - Patient declined  Copy of [place] in Chart? Yes  Would patient like information on creating a medical advance directive? -  Pre-existing out of facility DNR order (yellow form or pink MOST form) Yellow form placed in chart (order not valid for inpatient use);Pink MOST form placed in chart (order not valid for inpatient use)     Chief Complaint  Patient presents with  . Acute Visit    Increased swelling to  both legs    HPI:  Pt is a 83 y.o. female seen today for an acute visit for increased swelling to her legs with weeping over the weekend. She is currently on furosemide 60 mg daily in am and 40 mg pm. Also on kcl. Pashe has lungtient is seen in her room. She complaints of discomfort to her legs but denies pain. She was recently treated with antibiotic for cellulitis. She has been afebrile. She has breast cancer with metastases with anasarca.    Past Medical History:  Diagnosis Date  . Abnormal PET scan of mediastinum 11/11/2015  . Abnormalities of the hair 02/29/2012  . Arthritis   . BPV (benign positional vertigo)   . Cancer ([place])   . Cancer antigen 125 (CA 125) elevation 11/27/2013  . Candidiasis of skin and nails 03/19/2011  . 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 Chorioretinal scar, macular 01/23/2015  . Closed fracture of base of neck of femur ([place]) 03/18/2011  . Compression fracture of thoracic vertebra ([facility]) 11/24/2012  . Contact dermatitis and other eczema, due to unspecified cause 05/07/2011  . Depression 07/23/2014  . Diverticulosis of colon (without mention of hemorrhage) 03/19/2011  . Edema 05/07/2011  . HOH (hard of hearing)   . Hypertension 02/24/2016  . Hypopotassemia 03/26/2011  . Hyposmolality and/or hyponatremia 03/19/2011  . Hypotension, unspecified 03/18/2011  . IBS (irritable bowel syndrome) 11/27/2013  . Idiopathic scoliosis 01/27/2016  . Impaired fasting glucose 03/19/2011  . Insomnia, unspecified 03/19/2011  . Kidney infection   . Lumbago 03/19/2011  . Macular degeneration   . Malignant neoplasm of breast (female), unspecified site 10/28/2002  . Mass of right lung 10/23/2015  . Metastasis to adrenal gland ([place]) 11/21/2015   Breast cancer  . Metastatic breast cancer ([place])  10/27/2004  . Nonexudative age-related macular degeneration 12/10/2010  . Osteoporosis 08/06/2014  . Osteoporosis, unspecified 03/19/2011  . Other malaise and fatigue 03/19/2011  . Pain  in joint, pelvic region and thigh 03/19/2011  . Primary osteoarthritis of right hip 05/28/2014   TOTAL HIP ARTHROPLASTY ANTERIOR APPROACH HARDWARE REMOVAL on 05/28/2014   . Purpura senilis ([place]) 07/31/2013  . Radiculopathy, cervical 03/23/2016   Right C6  . Rib fractures   . Right bundle branch block 08/30/2011  . Shortness of breath   . Spasm of muscle 03/19/2011  . Spinal stenosis, lumbar region, with neurogenic claudication 11/02/2011  . TMJ click 2/95/2841  . Trigger finger (acquired) 11/02/2011  . Unspecified constipation 03/19/2011  . Urinary frequency 04/24/2013  . UTI (urinary tract infection) 04/16/2013   04/16/13 pseudomonas aeruginosa: tx with Cipro 06/15/14 P. Aeruginosa [name] 500mg  IM q12h x 7 days.     . Vaginitis and vulvovaginitis 02/29/2012  . Vertigo 08/26/2015   Past Surgical History:  Procedure Laterality Date  . BREAST SURGERY Bilateral 2005-10/27/2004   lumpectomy- Streck,MD  . CATARACT EXTRACTION  2010   bialteral  . EYE SURGERY     cataract  . FRACTURE SURGERY Left 09/1980   ankle  . HARDWARE REMOVAL Right 05/28/2014   Procedure: HARDWARE REMOVAL;  Surgeon: Melrose Nakayama, MD;  Location: [facility];  Service: Orthopedics;  Laterality: Right;  . HIP PINNING,CANNULATED  03/08/2011   Procedure: CANNULATED HIP PINNING;  Surgeon: Johnn Hai, MD;  Location: [facility];  Service: Orthopedics;  Laterality: Right;  . IR THORACENTESIS ASP PLEURAL SPACE W/IMG GUIDE  08/23/2016  . ORIF HIP FRACTURE Right 03/08/2011   Bean,MD  . SKIN CANCER EXCISION Left 10/12/2012   lower leg Dr. [place]  . SKIN LESION EXCISION Right 02/04/2011   Abdomen lesion spongiotic dermatitis-Taffeen, MD  . SQUAMOUS CELL CARCINOMA EXCISION Right 02/04/2011   forearmSyble Creek, MD  . TONSILLECTOMY    . TOTAL HIP ARTHROPLASTY Right 05/28/2014   Procedure: TOTAL HIP ARTHROPLASTY ANTERIOR APPROACH;  Surgeon: Melrose Nakayama, MD;  Location: [facility];  Service: Orthopedics;  Laterality: Right;    Allergies    Allergen Reactions  . Macrodantin [Nitrofurantoin] Other (See Comments)    Reaction:  Unknown   . Nystatin Swelling    Outpatient Encounter Prescriptions as of 11/08/2016  Medication Sig  . acetaminophen (TYLENOL) 500 MG tablet Take 1,000 mg by mouth every 6 (six) hours as needed for moderate pain.   Marland Kitchen ALPRAZolam (XANAX) 0.5 MG tablet Take 0.5 mg by mouth daily. In addition to scheduled daily dose pt may take 2 x day prn  . antiseptic oral rinse (BIOTENE) LIQD 15 mLs by Mouth Rinse route every 6 (six) hours as needed for dry mouth.   . Diphenhyd-Benzeth-Menth-Zn Ace (CALAGEL MAXIMUM STRENGTH EX) Apply 1 application topically as directed. 2 x day  . furosemide (LASIX) 20 MG tablet Take 60 mg by mouth daily. Hold if SBP <110  . furosemide (LASIX) 40 MG tablet Take 40 mg by mouth daily. Hold if SBP <110   . guaiFENesin (MUCINEX) 600 MG 12 hr tablet Take 600 mg by mouth 2 (two) times daily as needed for cough.  . hydroxypropyl methylcellulose / hypromellose (ISOPTO TEARS / GONIOVISC) 2.5 % ophthalmic solution Place 1 drop into both eyes 3 (three) times daily.  . meclizine (ANTIVERT) 25 MG tablet Take 1 tablet (25 mg total) by mouth 3 (three) times daily as needed for dizziness.  . mirtazapine (REMERON) 7.5 MG tablet Take 7.5 mg  by mouth at bedtime.  . NON FORMULARY daily as needed. 1 gatorade bottle   . omeprazole (PRILOSEC) 40 MG capsule Take 40 mg by mouth daily.  . ondansetron (ZOFRAN-ODT) 4 MG disintegrating tablet Take 4 mg by mouth daily as needed for nausea or vomiting.   . phenazopyridine (PYRIDIUM) 100 MG tablet Take 100 mg by mouth 3 (three) times daily as needed for pain. No more than 3 consecutive days  . polyethylene glycol (MIRALAX / GLYCOLAX) packet Take 17 g by mouth daily. Pt also uses once daily as needed for constipation.  . potassium chloride SA (K-DUR,KLOR-CON) 20 MEQ tablet Take 20 mEq by mouth daily.  . traMADol (ULTRAM) 50 MG tablet Take 50 mg by mouth 4 (four) times  daily -  with meals and at bedtime.   Marland Kitchen trimethoprim (TRIMPEX) 100 MG tablet Take 100 mg by mouth at bedtime.  Marland Kitchen ZINC OXIDE EX Apply 1 application topically 2 (two) times daily. And as needed after peri-care.  . [DISCONTINUED] OXYGEN Inhale 2 L into the lungs continuous.   No facility-administered encounter medications on file as of 11/08/2016.     Review of Systems  Constitutional: Negative for chills and fever.  HENT: Positive for hearing loss and trouble swallowing. Negative for congestion and mouth sores.   Respiratory: Negative for cough and shortness of breath.   Cardiovascular: Positive for leg swelling. Negative for chest pain and palpitations.  Gastrointestinal: Positive for abdominal distention. Negative for abdominal pain.       Abdominal discomfort present.   Genitourinary:       Foley catheter present  Skin: Positive for wound.  Neurological: Positive for weakness. Negative for dizziness and headaches.  Psychiatric/Behavioral: Negative for behavioral problems.    Immunization History  Administered Date(s) Administered  . Influenza Whole 10/12/2011, 10/12/2012  . Influenza-Unspecified 10/23/2013, 10/10/2014, 12/12/2015, 10/21/2016  . PPD Test 07/07/2010, 02/08/2012, 06/29/2014, 03/24/2016  . Pneumococcal Conjugate-13 01/12/2003  . Zoster 01/12/2007   Pertinent  Health Maintenance Due  Topic Date Due  . MAMMOGRAM  08/11/2016  . PNA vac Low Risk Adult (2 of 2 - PPSV23) November 13, 2016 (Originally 01/12/2004)  . INFLUENZA VACCINE  Completed  . DEXA SCAN  Completed   Fall Risk  08/25/2016 11/11/2015 10/16/2015 07/03/2015 01/28/2015  Falls in the past year? No Yes No No No  Number falls in past yr: - 1 - - -  Injury with Fall? - Yes - - -  Risk Factor Category  - High Fall Risk - - -  Risk for fall due to : - History of fall(s);Impaired balance/gait;Impaired mobility - - -  Follow up - Falls evaluation completed - - -   Functional Status Survey:    Vitals:   11/08/16 1035   BP: (!) 177/64  Pulse: 70  Resp: 18  Temp: (!) 97 F (36.1 C)  TempSrc: Oral  Weight: 137 lb 12.8 oz (62.5 kg)  Height: 5' (1.524 m)   Body mass index is 26.91 kg/m.   Wt Readings from Last 3 Encounters:  11/08/16 137 lb 12.8 oz (62.5 kg)  11/01/16 135 lb 6.4 oz (61.4 kg)  10/28/16 140 lb (63.5 kg)   Physical Exam  Constitutional:  Frail, elderly female in no acute distress  HENT:  Head: Normocephalic and atraumatic.  Eyes: Pupils are equal, round, and reactive to light. Conjunctivae and EOM are normal.  Neck: Neck supple.  Cardiovascular: Normal rate and regular rhythm.   Pulmonary/Chest: Effort normal and breath sounds normal.  Abdominal:  Soft. Bowel sounds are normal. She exhibits distension. There is no tenderness. There is no guarding.  Genitourinary:  Genitourinary Comments: Foley catheter with dark urine in bag  Musculoskeletal: She exhibits edema.  Can move all 4 extremities, generalized weakness, 2+ edema with erythema, dressing to right leg is saturated with clear fluid. No active weeping during exam.   Lymphadenopathy:    She has no cervical adenopathy.  Neurological: She is alert.  Oriented to self, person and place  Skin: Skin is warm and dry. She is not diaphoretic.  Left heel blister with fluid, some drainage to dressing on removal. unstageable pressure ulcer to her sacrum area.     Labs reviewed:  Recent Labs  02/18/16 1204  05/20/16 1658  08/16/16 1441  09/14/16 1326 09/27/16 10/07/16 10/11/16 1442  NA 129*  < > 134*  < > 131*  < > 130* 127* 128 123*  K 4.2  < > 4.1  < > 4.0  < > 4.1 4.3 4.6 4.8  CL 98*  --  100*  --   --   --   --   --  94  --   CO2 23  < > 28  < > 28  --  27  --  19 23  GLUCOSE 108*  < > 111*  < > 147*  --  140  --   --  121  BUN 13  < > 15  < > 19.1  < > 12.9 12 13  12.3  CREATININE 0.71  < > 0.81  < > 1.0  < > 0.9 0.8 0.8 0.8  CALCIUM 8.9  < > 7.6*  < > 8.4  --  8.3*  --  7.6 8.0*  < > = values in this interval not  displayed.  Recent Labs  09/14/16 1326 09/27/16 10/07/16 10/11/16 1442  AST 63* 46* 18 54*  ALT 30 19 48 21  ALKPHOS 300* 226* 232 255*  BILITOT 0.62  --  0.9 1.07  PROT 5.4*  --  5.2 5.3*  ALBUMIN 2.6*  --  2.8 2.4*    Recent Labs  08/16/16 1441 09/14/16 1326 09/27/16 10/11/16 1442  WBC 8.0 7.2 8.7 8.4  NEUTROABS 4.5 3.1  --  5.3  HGB 10.6* 11.1* 10.5* 11.8  HCT 32.8* 33.6* 32* 35.6  MCV 90.4 90.8  --  91.9  PLT 178 216 209 209   Lab Results  Component Value Date   TSH 3.215 08/25/2015   Lab Results  Component Value Date   HGBA1C 5.9 01/23/2015   No results found for: CHOL, HDL, LDLCALC, LDLDIRECT, TRIG, CHOLHDL  Significant Diagnostic Results in last 30 days:  Dg Chest 2 View  Result Date: 10/11/2016 CLINICAL DATA:  Breast cancer possible lung Mets short of breath EXAM: CHEST  2 VIEW COMPARISON:  08/23/2016, PET-CT 08/09/2016, thoracic spine radiograph 03/11/2016 FINDINGS: Moderate right pleural effusion and trace left pleural effusion. Atelectasis or infiltrate at the right lung base. Stable cardiomediastinal silhouette with aortic atherosclerosis. Postsurgical changes at the right axilla. Old appearing bilateral rib deformities. Scarring at the left lung apex. Stable moderate severe compression of lower thoracic vertebra. IMPRESSION: 1. Moderate right pleural effusion, increased compared to prior with worsened atelectasis or consolidation at the right lung base. 2. Trace left pleural effusion Electronically Signed   By: Donavan Foil M.D.   On: 10/11/2016 14:49   Ct Chest W Contrast  Result Date: 10/12/2016 CLINICAL DATA:  83 year old female with history of metastatic  breast cancer the adrenal glands and bones. Followup study. EXAM: CT CHEST, ABDOMEN, AND PELVIS WITH CONTRAST TECHNIQUE: Multidetector CT imaging of the chest, abdomen and pelvis was performed following the standard protocol during bolus administration of intravenous contrast. CONTRAST:  170mL ISOVUE-300  IOPAMIDOL (ISOVUE-300) INJECTION 61% COMPARISON:  PET-CT 08/09/2016. CT the chest, abdomen and pelvis 02/18/2016. FINDINGS: CT CHEST FINDINGS Cardiovascular: Heart size is normal. There is no significant pericardial fluid, thickening or pericardial calcification. There is aortic atherosclerosis, as well as atherosclerosis of the great vessels of the mediastinum and the coronary arteries, including calcified atherosclerotic plaque in the left main and right coronary arteries. Mediastinum/Nodes: No pathologically enlarged mediastinal, hilar or internal mammary lymph nodes. Esophagus is unremarkable in appearance. Numerous prominent but nonenlarged enhancing left axillary lymph nodes, nonspecific but similar in size to prior PET-CT 08/09/2016, at which time these nonenlarged lymph nodes were hypermetabolic. Status post right axillary lymph node dissection. Lungs/Pleura: Large chronic right pleural effusion with extensive passive subsegmental atelectasis in the dependent portions of the right lung. Extensive chronic scarring in the right middle lobe where there is also cylindrical and varicose bronchiectasis. Persistent masslike partially calcified soft tissue thickening in the right apex, similar to prior examinations, most compatible with chronic post infectious or inflammatory scarring. Similar plaque-like scarring is also noted in the apex of the left upper lobe as well. No definite new suspicious appearing pulmonary nodules or masses are noted. Trace left pleural effusion is new compared to the prior examination lying dependently. Musculoskeletal: Innumerable sclerotic lesions are noted throughout the visualized axial and appendicular skeleton, compatible with widespread metastatic disease to the bones. Old pathologic compression fracture of T11 with 90% loss of central vertebral body height is unchanged. Multiple healed and healing nondisplaced pathologic rib fractures bilaterally. CT ABDOMEN PELVIS FINDINGS  Hepatobiliary: Liver has a slightly shrunken appearance and nodular contour, suggesting underlying cirrhosis. No discrete cystic or solid hepatic lesions. No intra or extrahepatic biliary ductal dilatation. Gallbladder is moderately distended. No definite findings to strongly suggest an acute cholecystitis at this time (assessment is limited by large volume of ascites). Pancreas: No pancreatic mass. No pancreatic ductal dilatation. No well-defined pancreatic or peripancreatic fluid collections. Spleen: Unremarkable. Adrenals/Urinary Tract: 2.0 x 1.0 cm left adrenal nodule is stable dating back to 02/18/2016. Increasing mass-like thickening of the right adrenal gland, which currently measures 4.3 x 1.6 cm, presumably from an enlarging metastatic lesion. Mild bilateral hydronephrosis, similar to prior examinations. The appearance of the kidneys is otherwise unremarkable. Ureters do not appear dilated. Urinary bladder is completely decompressed with an indwelling Foley catheter in place. Stomach/Bowel: The appearance of the stomach is normal. There is no pathologic dilatation of small bowel or colon. The appendix is not confidently identified and may be surgically absent. Regardless, there are no inflammatory changes noted adjacent to the cecum to suggest the presence of an acute appendicitis at this time. Vascular/Lymphatic: Aortic atherosclerosis, without evidence of aneurysm or dissection in the abdominal or pelvic vasculature. No lymphadenopathy noted in the abdomen or pelvis. Reproductive: Uterus and ovaries are atrophic. Other: Moderate to large volume of ascites.  No pneumoperitoneum. Musculoskeletal: Innumerable sclerotic lesions are again noted throughout the visualized axial and appendicular skeleton, compatible with widespread metastatic disease to the bone. Status post right hip arthroplasty. IMPRESSION: 1. Widespread metastatic disease to the bone appears similar to prior examinations. There continues to  be bilateral adrenal lesions, with the lesion in the left adrenal gland stable compared to prior studies, but increasing mass-like enlargement  of the right adrenal gland, suggesting an enlarging metastatic lesion. Previously noted nonenlarged left axillary lymphadenopathy is stable in size, demonstrating avid enhancement (these lymph nodes were hypermetabolic on prior PET-CT). 2. Increasing moderate to large volume of ascites. 3. Stable large right pleural effusion. New trace left pleural effusion lying dependently. Areas of associated passive subsegmental atelectasis are noted throughout the right lung. Additional areas of scarring are also noted in the lungs bilaterally, as above. 4. Aortic atherosclerosis, in addition to 2 vessel coronary artery disease. 5. Additional incidental findings, as above. 6. No new sites of metastatic disease noted elsewhere in the chest, abdomen or pelvis. Aortic Atherosclerosis (ICD10-I70.0). Electronically Signed   By: Vinnie Langton M.D.   On: 10/12/2016 14:27   Ct Abdomen Pelvis W Contrast  Result Date: 10/12/2016 CLINICAL DATA:  83 year old female with history of metastatic breast cancer the adrenal glands and bones. Followup study. EXAM: CT CHEST, ABDOMEN, AND PELVIS WITH CONTRAST TECHNIQUE: Multidetector CT imaging of the chest, abdomen and pelvis was performed following the standard protocol during bolus administration of intravenous contrast. CONTRAST:  157mL ISOVUE-300 IOPAMIDOL (ISOVUE-300) INJECTION 61% COMPARISON:  PET-CT 08/09/2016. CT the chest, abdomen and pelvis 02/18/2016. FINDINGS: CT CHEST FINDINGS Cardiovascular: Heart size is normal. There is no significant pericardial fluid, thickening or pericardial calcification. There is aortic atherosclerosis, as well as atherosclerosis of the great vessels of the mediastinum and the coronary arteries, including calcified atherosclerotic plaque in the left main and right coronary arteries. Mediastinum/Nodes: No  pathologically enlarged mediastinal, hilar or internal mammary lymph nodes. Esophagus is unremarkable in appearance. Numerous prominent but nonenlarged enhancing left axillary lymph nodes, nonspecific but similar in size to prior PET-CT 08/09/2016, at which time these nonenlarged lymph nodes were hypermetabolic. Status post right axillary lymph node dissection. Lungs/Pleura: Large chronic right pleural effusion with extensive passive subsegmental atelectasis in the dependent portions of the right lung. Extensive chronic scarring in the right middle lobe where there is also cylindrical and varicose bronchiectasis. Persistent masslike partially calcified soft tissue thickening in the right apex, similar to prior examinations, most compatible with chronic post infectious or inflammatory scarring. Similar plaque-like scarring is also noted in the apex of the left upper lobe as well. No definite new suspicious appearing pulmonary nodules or masses are noted. Trace left pleural effusion is new compared to the prior examination lying dependently. Musculoskeletal: Innumerable sclerotic lesions are noted throughout the visualized axial and appendicular skeleton, compatible with widespread metastatic disease to the bones. Old pathologic compression fracture of T11 with 90% loss of central vertebral body height is unchanged. Multiple healed and healing nondisplaced pathologic rib fractures bilaterally. CT ABDOMEN PELVIS FINDINGS Hepatobiliary: Liver has a slightly shrunken appearance and nodular contour, suggesting underlying cirrhosis. No discrete cystic or solid hepatic lesions. No intra or extrahepatic biliary ductal dilatation. Gallbladder is moderately distended. No definite findings to strongly suggest an acute cholecystitis at this time (assessment is limited by large volume of ascites). Pancreas: No pancreatic mass. No pancreatic ductal dilatation. No well-defined pancreatic or peripancreatic fluid collections. Spleen:  Unremarkable. Adrenals/Urinary Tract: 2.0 x 1.0 cm left adrenal nodule is stable dating back to 02/18/2016. Increasing mass-like thickening of the right adrenal gland, which currently measures 4.3 x 1.6 cm, presumably from an enlarging metastatic lesion. Mild bilateral hydronephrosis, similar to prior examinations. The appearance of the kidneys is otherwise unremarkable. Ureters do not appear dilated. Urinary bladder is completely decompressed with an indwelling Foley catheter in place. Stomach/Bowel: The appearance of the stomach is normal. There  is no pathologic dilatation of small bowel or colon. The appendix is not confidently identified and may be surgically absent. Regardless, there are no inflammatory changes noted adjacent to the cecum to suggest the presence of an acute appendicitis at this time. Vascular/Lymphatic: Aortic atherosclerosis, without evidence of aneurysm or dissection in the abdominal or pelvic vasculature. No lymphadenopathy noted in the abdomen or pelvis. Reproductive: Uterus and ovaries are atrophic. Other: Moderate to large volume of ascites.  No pneumoperitoneum. Musculoskeletal: Innumerable sclerotic lesions are again noted throughout the visualized axial and appendicular skeleton, compatible with widespread metastatic disease to the bone. Status post right hip arthroplasty. IMPRESSION: 1. Widespread metastatic disease to the bone appears similar to prior examinations. There continues to be bilateral adrenal lesions, with the lesion in the left adrenal gland stable compared to prior studies, but increasing mass-like enlargement of the right adrenal gland, suggesting an enlarging metastatic lesion. Previously noted nonenlarged left axillary lymphadenopathy is stable in size, demonstrating avid enhancement (these lymph nodes were hypermetabolic on prior PET-CT). 2. Increasing moderate to large volume of ascites. 3. Stable large right pleural effusion. New trace left pleural effusion lying  dependently. Areas of associated passive subsegmental atelectasis are noted throughout the right lung. Additional areas of scarring are also noted in the lungs bilaterally, as above. 4. Aortic atherosclerosis, in addition to 2 vessel coronary artery disease. 5. Additional incidental findings, as above. 6. No new sites of metastatic disease noted elsewhere in the chest, abdomen or pelvis. Aortic Atherosclerosis (ICD10-I70.0). Electronically Signed   By: Vinnie Langton M.D.   On: 10/12/2016 14:27    Assessment/Plan  Anasarca With her cancer with mets. Currently on lasix 60 mg in am and 40 mg pm. Reviewed weight. Change lasix to 60 mg bid and add zaroxolyn 2.5 mg 3 days a week to help synergize the effect of lasix. Diuresis is for comfort reason at present with goal of care being comfort. To be followed by hospice services  unstageable sacral pressure ulcer With eschar and slough. Clean area with NS, apply santyl and hydrogel with calcium aliginate and allevyn dressing, change it daily. Poor prognosis with her protein calorie malnutrition, deconditioning and limited mobility. Will need low air loss mattress and decubivite. Continue her tramadol for pain.    Family/ staff Communication: reviewed care plan with patient and charge nurse.    Labs/tests ordered:  none  Blanchie Serve, MD Internal Medicine [facility] [address] [place], [place] [address] Cell Phone (Monday-Friday 8 am - 5 pm): [phone] On Call: [phone] and follow prompts after 5 pm and on weekends Office Phone: [phone] Office Fax: [phone]

## 2016-11-09 DIAGNOSIS — R188 Other ascites: Secondary | ICD-10-CM | POA: Diagnosis not present

## 2016-11-09 DIAGNOSIS — C7800 Secondary malignant neoplasm of unspecified lung: Secondary | ICD-10-CM | POA: Diagnosis not present

## 2016-11-09 DIAGNOSIS — J910 Malignant pleural effusion: Secondary | ICD-10-CM | POA: Diagnosis not present

## 2016-11-09 DIAGNOSIS — C7951 Secondary malignant neoplasm of bone: Secondary | ICD-10-CM | POA: Diagnosis not present

## 2016-11-09 DIAGNOSIS — C7970 Secondary malignant neoplasm of unspecified adrenal gland: Secondary | ICD-10-CM | POA: Diagnosis not present

## 2016-11-09 DIAGNOSIS — C50919 Malignant neoplasm of unspecified site of unspecified female breast: Secondary | ICD-10-CM | POA: Diagnosis not present

## 2016-11-11 DIAGNOSIS — N390 Urinary tract infection, site not specified: Secondary | ICD-10-CM | POA: Diagnosis not present

## 2016-11-11 DIAGNOSIS — R54 Age-related physical debility: Secondary | ICD-10-CM | POA: Diagnosis not present

## 2016-11-11 DIAGNOSIS — K589 Irritable bowel syndrome without diarrhea: Secondary | ICD-10-CM | POA: Diagnosis not present

## 2016-11-11 DIAGNOSIS — C50919 Malignant neoplasm of unspecified site of unspecified female breast: Secondary | ICD-10-CM | POA: Diagnosis not present

## 2016-11-11 DIAGNOSIS — C7800 Secondary malignant neoplasm of unspecified lung: Secondary | ICD-10-CM | POA: Diagnosis not present

## 2016-11-11 DIAGNOSIS — R188 Other ascites: Secondary | ICD-10-CM | POA: Diagnosis not present

## 2016-11-11 DIAGNOSIS — E871 Hypo-osmolality and hyponatremia: Secondary | ICD-10-CM | POA: Diagnosis not present

## 2016-11-11 DIAGNOSIS — C7951 Secondary malignant neoplasm of bone: Secondary | ICD-10-CM | POA: Diagnosis not present

## 2016-11-11 DIAGNOSIS — F339 Major depressive disorder, recurrent, unspecified: Secondary | ICD-10-CM | POA: Diagnosis not present

## 2016-11-11 DIAGNOSIS — N319 Neuromuscular dysfunction of bladder, unspecified: Secondary | ICD-10-CM | POA: Diagnosis not present

## 2016-11-11 DIAGNOSIS — J910 Malignant pleural effusion: Secondary | ICD-10-CM | POA: Diagnosis not present

## 2016-11-11 DIAGNOSIS — C7970 Secondary malignant neoplasm of unspecified adrenal gland: Secondary | ICD-10-CM | POA: Diagnosis not present

## 2016-11-11 DIAGNOSIS — L209 Atopic dermatitis, unspecified: Secondary | ICD-10-CM | POA: Diagnosis not present

## 2016-11-11 DIAGNOSIS — H3530 Unspecified macular degeneration: Secondary | ICD-10-CM | POA: Diagnosis not present

## 2016-11-11 DIAGNOSIS — R110 Nausea: Secondary | ICD-10-CM | POA: Diagnosis not present

## 2016-11-12 ENCOUNTER — Encounter: Payer: Self-pay | Admitting: Internal Medicine

## 2016-11-15 NOTE — Progress Notes (Signed)
This encounter was created in error - please disregard.

## 2016-12-06 ENCOUNTER — Other Ambulatory Visit: Payer: Medicare Other

## 2016-12-06 ENCOUNTER — Ambulatory Visit: Payer: Medicare Other | Admitting: Oncology

## 2016-12-06 ENCOUNTER — Ambulatory Visit: Payer: Medicare Other

## 2016-12-11 DEATH — deceased

## 2016-12-15 ENCOUNTER — Other Ambulatory Visit: Payer: Self-pay | Admitting: Nurse Practitioner

## 2016-12-21 ENCOUNTER — Ambulatory Visit: Payer: Medicare Other | Admitting: Podiatry

## 2017-01-06 ENCOUNTER — Ambulatory Visit: Payer: Medicare Other

## 2017-01-06 ENCOUNTER — Other Ambulatory Visit: Payer: Medicare Other

## 2017-08-16 IMAGING — PT NM PET TUM IMG RESTAG (PS) SKULL BASE T - THIGH
1 of 8 series · 1 of 25 positions shown · non-contrast
Comparison: CT chest/abdomen/pelvis dated 02/18/2016

CLINICAL DATA: Subsequent treatment strategy for metastatic breast
cancer.

EXAM:
NUCLEAR MEDICINE PET SKULL BASE TO THIGH
TECHNIQUE: 6.6 mCi F-18 FDG was injected intravenously. Full-ring PET imaging
was performed from the skull base to thigh after the radiotracer. CT
data was obtained and used for attenuation correction and anatomic
localization.
FASTING BLOOD GLUCOSE:  Value: 124 mg/dl

[Series 4: ct sk_thigh 5.0 b31f · axial · 5.0mm · 0.94mm/px · 1 of 199 slices shown]
[im 199/199  brain]
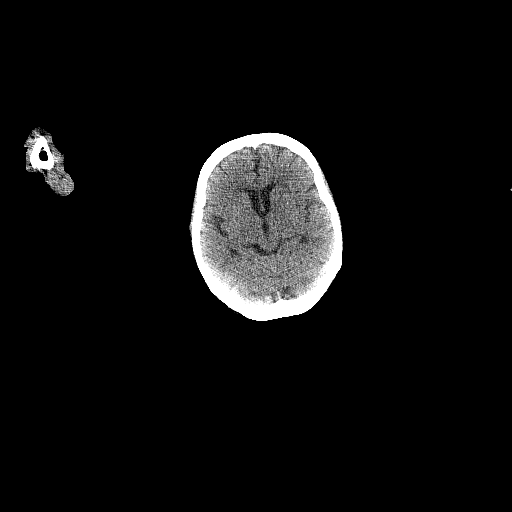

[1 of 25 positions shown; findings below may reference images not displayed]

FINDINGS: NECK

No hypermetabolic lymph nodes in the neck.

CHEST

13 mm nodule along the anterior right lung base (series 8/image 51),
max SUV 3.7, metastasis not excluded.

Moderate right pleural effusion with associated right lower lobe
atelectasis. Subpleural scarring with volume loss in the anterior
right middle lobe (series 8/image 30) and medial right lower lobe
(series 8/ image 47), non FDG avid. Biapical pleural-parenchymal
scarring/radiation changes (series 8/image 11).

Small left axillary nodes measuring up to 6 mm short axis.

Hypermetabolic mediastinal lymphadenopathy, including:

--9 mm short axis low right paratracheal node (series 4/ image 61),
max SUV 4.2 (previously 8.7)

--8 mm short axis subcarinal node (series 4/image 65), max SUV
(previously 9.9)

--6 mm short axis right pericardial node (series 4/ image 70), max
SUV 4.9 (previously 4.9)

ABDOMEN/PELVIS

1.8 x 1.1 cm left adrenal nodule (series 4/image 99), max SUV
(previously 11.8), corresponding to known adrenal metastasis. Right
adrenal gland demonstrates max SUV 4.3 (previously 8.4).

No abnormal hypermetabolic activity within the liver, pancreas, or
spleen.

No hypermetabolic lymph nodes in the abdomen or pelvis. 5 mm short
axis right pelvic sidewall node (series 4/image 146), non FDG avid
(previously max SUV 5.6).

Atherosclerotic calcifications the abdominal aorta and branch
vessels. Trace right pelvic ascites.

SKELETON

Degenerative changes of the visualized thoracolumbar spine. Right
hip arthroplasty.

Multifocal/ diffuse lytic/sclerotic metastases are suspected on CT.
Additionally, there are suspected multiple rib fractures, some of
which are acute/ subacute (series 4/images 40, 49, 54, 60, and 78)
and some of which are subacute/chronic (series 4/ images 86 and 92).

Multifocal hypermetabolism is present involving the bilateral ribs,
max SUV 4.9 on the left and 3.5 on the right, although it is unclear
to what extent this reflects trauma versus neoplasm.
IMPRESSION: 13 mm pleural-based nodule the anterior right lung base, suspicious
for pulmonary metastasis.

Mild mediastinal nodal metastases and bilateral adrenal metastases.

Multifocal osseous metastases, with superimposed acute/subacute
pathologic fractures involving multiple left ribs.

When compared to the prior, the suspected pulmonary metastasis was
not clearly evident on PET, although this may be technical.
Otherwise, the hypermetabolism of most lesions has actually
decreased.
